# Patient Record
Sex: Female | Born: 1970
Health system: Southern US, Community
[De-identification: ages and names within clinical notes are randomized; demographics above are authoritative.]

## PROBLEM LIST (undated history)

## (undated) DIAGNOSIS — A498 Other bacterial infections of unspecified site: Secondary | ICD-10-CM

## (undated) DIAGNOSIS — K589 Irritable bowel syndrome without diarrhea: Secondary | ICD-10-CM

## (undated) DIAGNOSIS — Z9889 Other specified postprocedural states: Secondary | ICD-10-CM

## (undated) DIAGNOSIS — I2510 Atherosclerotic heart disease of native coronary artery without angina pectoris: Secondary | ICD-10-CM

## (undated) DIAGNOSIS — E039 Hypothyroidism, unspecified: Secondary | ICD-10-CM

## (undated) DIAGNOSIS — K219 Gastro-esophageal reflux disease without esophagitis: Secondary | ICD-10-CM

## (undated) DIAGNOSIS — I499 Cardiac arrhythmia, unspecified: Secondary | ICD-10-CM

## (undated) DIAGNOSIS — H9190 Unspecified hearing loss, unspecified ear: Secondary | ICD-10-CM

## (undated) DIAGNOSIS — B029 Zoster without complications: Secondary | ICD-10-CM

## (undated) DIAGNOSIS — E669 Obesity, unspecified: Secondary | ICD-10-CM

## (undated) DIAGNOSIS — K8020 Calculus of gallbladder without cholecystitis without obstruction: Secondary | ICD-10-CM

## (undated) DIAGNOSIS — K7689 Other specified diseases of liver: Secondary | ICD-10-CM

## (undated) DIAGNOSIS — I10 Essential (primary) hypertension: Secondary | ICD-10-CM

## (undated) DIAGNOSIS — Z800 Family history of malignant neoplasm of digestive organs: Secondary | ICD-10-CM

## (undated) DIAGNOSIS — R112 Nausea with vomiting, unspecified: Secondary | ICD-10-CM

## (undated) DIAGNOSIS — E785 Hyperlipidemia, unspecified: Secondary | ICD-10-CM

## (undated) DIAGNOSIS — N809 Endometriosis, unspecified: Secondary | ICD-10-CM

## (undated) DIAGNOSIS — R55 Syncope and collapse: Secondary | ICD-10-CM

## (undated) DIAGNOSIS — G43909 Migraine, unspecified, not intractable, without status migrainosus: Secondary | ICD-10-CM

## (undated) DIAGNOSIS — F419 Anxiety disorder, unspecified: Secondary | ICD-10-CM

## (undated) HISTORY — DX: Cardiac arrhythmia, unspecified: I49.9

## (undated) HISTORY — DX: Obesity, unspecified: E66.9

## (undated) HISTORY — DX: Calculus of gallbladder without cholecystitis without obstruction: K80.20

## (undated) HISTORY — PX: CORONARY ANGIOPLASTY: SHX604

## (undated) HISTORY — DX: Syncope and collapse: R55

## (undated) HISTORY — DX: Unspecified hearing loss, unspecified ear: H91.90

## (undated) HISTORY — DX: Other specified diseases of liver: K76.89

## (undated) HISTORY — PX: TUBAL LIGATION: SHX77

## (undated) HISTORY — PX: ABDOMINAL ADHESION SURGERY: SHX90

## (undated) HISTORY — DX: Family history of malignant neoplasm of digestive organs: Z80.0

## (undated) HISTORY — DX: Hypothyroidism, unspecified: E03.9

## (undated) HISTORY — DX: Endometriosis, unspecified: N80.9

## (undated) HISTORY — DX: Other bacterial infections of unspecified site: A49.8

## (undated) HISTORY — DX: Essential (primary) hypertension: I10

## (undated) HISTORY — PX: JOINT REPLACEMENT: SHX530

## (undated) HISTORY — DX: Hyperlipidemia, unspecified: E78.5

## (undated) HISTORY — DX: Anxiety disorder, unspecified: F41.9

## (undated) HISTORY — PX: SALPINGOOPHORECTOMY: SHX82

## (undated) HISTORY — PX: TONSILLECTOMY: SUR1361

## (undated) HISTORY — DX: Atherosclerotic heart disease of native coronary artery without angina pectoris: I25.10

## (undated) HISTORY — DX: Irritable bowel syndrome, unspecified: K58.9

## (undated) HISTORY — DX: Zoster without complications: B02.9

## (undated) HISTORY — DX: Gastro-esophageal reflux disease without esophagitis: K21.9

---

## 1995-09-18 HISTORY — PX: OTHER SURGICAL HISTORY: SHX169

## 1997-12-26 ENCOUNTER — Inpatient Hospital Stay (HOSPITAL_COMMUNITY): Admission: AD | Admit: 1997-12-26 | Discharge: 1997-12-26 | Payer: Self-pay | Admitting: Obstetrics and Gynecology

## 1998-01-20 ENCOUNTER — Emergency Department (HOSPITAL_COMMUNITY): Admission: EM | Admit: 1998-01-20 | Discharge: 1998-01-20 | Payer: Self-pay | Admitting: Emergency Medicine

## 1998-01-24 ENCOUNTER — Ambulatory Visit (HOSPITAL_COMMUNITY): Admission: RE | Admit: 1998-01-24 | Discharge: 1998-01-24 | Payer: Self-pay | Admitting: Orthopedic Surgery

## 1998-02-15 ENCOUNTER — Ambulatory Visit (HOSPITAL_BASED_OUTPATIENT_CLINIC_OR_DEPARTMENT_OTHER): Admission: RE | Admit: 1998-02-15 | Discharge: 1998-02-15 | Payer: Self-pay | Admitting: Otolaryngology

## 1998-02-25 ENCOUNTER — Ambulatory Visit (HOSPITAL_COMMUNITY): Admission: RE | Admit: 1998-02-25 | Discharge: 1998-02-25 | Payer: Self-pay | Admitting: Family Medicine

## 1998-03-10 ENCOUNTER — Emergency Department (HOSPITAL_COMMUNITY): Admission: EM | Admit: 1998-03-10 | Discharge: 1998-03-10 | Payer: Self-pay | Admitting: Emergency Medicine

## 1998-03-25 ENCOUNTER — Other Ambulatory Visit: Admission: RE | Admit: 1998-03-25 | Discharge: 1998-03-25 | Payer: Self-pay | Admitting: *Deleted

## 1998-04-13 ENCOUNTER — Other Ambulatory Visit: Admission: RE | Admit: 1998-04-13 | Discharge: 1998-04-13 | Payer: Self-pay | Admitting: Obstetrics and Gynecology

## 1998-04-28 ENCOUNTER — Inpatient Hospital Stay (HOSPITAL_COMMUNITY): Admission: AD | Admit: 1998-04-28 | Discharge: 1998-04-29 | Payer: Self-pay | Admitting: Obstetrics and Gynecology

## 1998-05-02 ENCOUNTER — Inpatient Hospital Stay (HOSPITAL_COMMUNITY): Admission: AD | Admit: 1998-05-02 | Discharge: 1998-05-02 | Payer: Self-pay | Admitting: Gynecology

## 1998-05-22 ENCOUNTER — Emergency Department (HOSPITAL_COMMUNITY): Admission: RE | Admit: 1998-05-22 | Discharge: 1998-05-22 | Payer: Self-pay | Admitting: Internal Medicine

## 1998-08-09 ENCOUNTER — Encounter: Payer: Self-pay | Admitting: Family Medicine

## 1998-08-09 ENCOUNTER — Ambulatory Visit (HOSPITAL_COMMUNITY): Admission: RE | Admit: 1998-08-09 | Discharge: 1998-08-09 | Payer: Self-pay | Admitting: Family Medicine

## 1998-08-12 ENCOUNTER — Encounter: Payer: Self-pay | Admitting: Emergency Medicine

## 1998-08-12 ENCOUNTER — Emergency Department (HOSPITAL_COMMUNITY): Admission: EM | Admit: 1998-08-12 | Discharge: 1998-08-12 | Payer: Self-pay | Admitting: Emergency Medicine

## 1998-08-13 ENCOUNTER — Ambulatory Visit (HOSPITAL_COMMUNITY): Admission: RE | Admit: 1998-08-13 | Discharge: 1998-08-13 | Payer: Self-pay | Admitting: Emergency Medicine

## 1998-08-17 ENCOUNTER — Ambulatory Visit (HOSPITAL_COMMUNITY): Admission: RE | Admit: 1998-08-17 | Discharge: 1998-08-17 | Payer: Self-pay | Admitting: Gastroenterology

## 1998-08-17 ENCOUNTER — Encounter: Payer: Self-pay | Admitting: Gastroenterology

## 1999-03-30 ENCOUNTER — Emergency Department (HOSPITAL_COMMUNITY): Admission: EM | Admit: 1999-03-30 | Discharge: 1999-03-30 | Payer: Self-pay

## 1999-05-01 ENCOUNTER — Ambulatory Visit (HOSPITAL_COMMUNITY): Admission: RE | Admit: 1999-05-01 | Discharge: 1999-05-01 | Payer: Self-pay | Admitting: *Deleted

## 1999-05-01 ENCOUNTER — Encounter (INDEPENDENT_AMBULATORY_CARE_PROVIDER_SITE_OTHER): Payer: Self-pay

## 1999-06-07 ENCOUNTER — Encounter: Payer: Self-pay | Admitting: Emergency Medicine

## 1999-06-07 ENCOUNTER — Emergency Department (HOSPITAL_COMMUNITY): Admission: EM | Admit: 1999-06-07 | Discharge: 1999-06-07 | Payer: Self-pay | Admitting: Emergency Medicine

## 1999-06-20 ENCOUNTER — Emergency Department (HOSPITAL_COMMUNITY): Admission: EM | Admit: 1999-06-20 | Discharge: 1999-06-20 | Payer: Self-pay | Admitting: Emergency Medicine

## 1999-07-03 ENCOUNTER — Encounter: Admission: RE | Admit: 1999-07-03 | Discharge: 1999-07-03 | Payer: Self-pay | Admitting: Internal Medicine

## 1999-07-10 ENCOUNTER — Encounter: Admission: RE | Admit: 1999-07-10 | Discharge: 1999-07-10 | Payer: Self-pay | Admitting: Internal Medicine

## 1999-08-21 ENCOUNTER — Emergency Department (HOSPITAL_COMMUNITY): Admission: EM | Admit: 1999-08-21 | Discharge: 1999-08-21 | Payer: Self-pay | Admitting: Emergency Medicine

## 1999-08-28 ENCOUNTER — Encounter: Admission: RE | Admit: 1999-08-28 | Discharge: 1999-08-28 | Payer: Self-pay | Admitting: Internal Medicine

## 1999-09-20 ENCOUNTER — Emergency Department (HOSPITAL_COMMUNITY): Admission: EM | Admit: 1999-09-20 | Discharge: 1999-09-20 | Payer: Self-pay | Admitting: Emergency Medicine

## 1999-09-20 ENCOUNTER — Encounter: Payer: Self-pay | Admitting: Emergency Medicine

## 1999-09-21 ENCOUNTER — Encounter: Admission: RE | Admit: 1999-09-21 | Discharge: 1999-09-21 | Payer: Self-pay | Admitting: Internal Medicine

## 1999-09-24 ENCOUNTER — Ambulatory Visit (HOSPITAL_COMMUNITY): Admission: RE | Admit: 1999-09-24 | Discharge: 1999-09-24 | Payer: Self-pay | Admitting: *Deleted

## 1999-09-26 ENCOUNTER — Encounter: Admission: RE | Admit: 1999-09-26 | Discharge: 1999-09-26 | Payer: Self-pay | Admitting: Hematology and Oncology

## 1999-10-05 ENCOUNTER — Encounter: Admission: RE | Admit: 1999-10-05 | Discharge: 1999-10-05 | Payer: Self-pay | Admitting: Internal Medicine

## 1999-10-20 ENCOUNTER — Ambulatory Visit (HOSPITAL_COMMUNITY): Admission: RE | Admit: 1999-10-20 | Discharge: 1999-10-20 | Payer: Self-pay | Admitting: *Deleted

## 1999-10-30 ENCOUNTER — Encounter: Admission: RE | Admit: 1999-10-30 | Discharge: 1999-10-30 | Payer: Self-pay | Admitting: Internal Medicine

## 1999-11-16 ENCOUNTER — Encounter: Admission: RE | Admit: 1999-11-16 | Discharge: 1999-11-16 | Payer: Self-pay | Admitting: Hematology and Oncology

## 2000-01-05 ENCOUNTER — Encounter: Admission: RE | Admit: 2000-01-05 | Discharge: 2000-01-05 | Payer: Self-pay | Admitting: Internal Medicine

## 2000-01-09 ENCOUNTER — Ambulatory Visit (HOSPITAL_COMMUNITY): Admission: RE | Admit: 2000-01-09 | Discharge: 2000-01-09 | Payer: Self-pay | Admitting: *Deleted

## 2000-01-09 ENCOUNTER — Encounter: Payer: Self-pay | Admitting: Internal Medicine

## 2000-01-10 ENCOUNTER — Encounter: Admission: RE | Admit: 2000-01-10 | Discharge: 2000-01-10 | Payer: Self-pay | Admitting: Hematology and Oncology

## 2000-01-17 ENCOUNTER — Encounter: Payer: Self-pay | Admitting: Obstetrics

## 2000-01-17 ENCOUNTER — Inpatient Hospital Stay (HOSPITAL_COMMUNITY): Admission: EM | Admit: 2000-01-17 | Discharge: 2000-01-17 | Payer: Self-pay | Admitting: Obstetrics

## 2000-01-17 ENCOUNTER — Encounter: Admission: RE | Admit: 2000-01-17 | Discharge: 2000-01-17 | Payer: Self-pay | Admitting: Hematology and Oncology

## 2000-01-25 ENCOUNTER — Encounter: Admission: RE | Admit: 2000-01-25 | Discharge: 2000-01-25 | Payer: Self-pay | Admitting: Obstetrics

## 2000-02-22 ENCOUNTER — Encounter: Admission: RE | Admit: 2000-02-22 | Discharge: 2000-02-22 | Payer: Self-pay | Admitting: Internal Medicine

## 2000-02-27 ENCOUNTER — Encounter: Admission: RE | Admit: 2000-02-27 | Discharge: 2000-02-27 | Payer: Self-pay | Admitting: Hematology and Oncology

## 2000-03-05 ENCOUNTER — Encounter: Admission: RE | Admit: 2000-03-05 | Discharge: 2000-03-05 | Payer: Self-pay | Admitting: Obstetrics

## 2000-03-19 ENCOUNTER — Encounter: Admission: RE | Admit: 2000-03-19 | Discharge: 2000-03-19 | Payer: Self-pay | Admitting: Obstetrics

## 2000-04-16 ENCOUNTER — Encounter (INDEPENDENT_AMBULATORY_CARE_PROVIDER_SITE_OTHER): Payer: Self-pay | Admitting: Specialist

## 2000-04-16 ENCOUNTER — Encounter (INDEPENDENT_AMBULATORY_CARE_PROVIDER_SITE_OTHER): Payer: Self-pay

## 2000-04-16 ENCOUNTER — Ambulatory Visit (HOSPITAL_COMMUNITY): Admission: RE | Admit: 2000-04-16 | Discharge: 2000-04-16 | Payer: Self-pay | Admitting: Obstetrics and Gynecology

## 2000-04-16 HISTORY — PX: LAPAROSCOPIC OVARIAN CYSTECTOMY: SUR786

## 2000-04-28 ENCOUNTER — Emergency Department (HOSPITAL_COMMUNITY): Admission: EM | Admit: 2000-04-28 | Discharge: 2000-04-28 | Payer: Self-pay | Admitting: Emergency Medicine

## 2000-05-01 ENCOUNTER — Encounter: Admission: RE | Admit: 2000-05-01 | Discharge: 2000-05-01 | Payer: Self-pay | Admitting: Hematology and Oncology

## 2000-05-16 ENCOUNTER — Encounter: Admission: RE | Admit: 2000-05-16 | Discharge: 2000-05-16 | Payer: Self-pay | Admitting: Hematology and Oncology

## 2000-06-02 ENCOUNTER — Encounter: Payer: Self-pay | Admitting: Emergency Medicine

## 2000-06-02 ENCOUNTER — Emergency Department (HOSPITAL_COMMUNITY): Admission: EM | Admit: 2000-06-02 | Discharge: 2000-06-02 | Payer: Self-pay | Admitting: Emergency Medicine

## 2000-07-04 ENCOUNTER — Emergency Department (HOSPITAL_COMMUNITY): Admission: EM | Admit: 2000-07-04 | Discharge: 2000-07-04 | Payer: Self-pay | Admitting: Emergency Medicine

## 2000-07-07 ENCOUNTER — Encounter: Payer: Self-pay | Admitting: Emergency Medicine

## 2000-07-07 ENCOUNTER — Emergency Department (HOSPITAL_COMMUNITY): Admission: EM | Admit: 2000-07-07 | Discharge: 2000-07-07 | Payer: Self-pay | Admitting: Emergency Medicine

## 2000-07-09 ENCOUNTER — Ambulatory Visit (HOSPITAL_COMMUNITY): Admission: RE | Admit: 2000-07-09 | Discharge: 2000-07-09 | Payer: Self-pay

## 2000-07-09 ENCOUNTER — Encounter: Admission: RE | Admit: 2000-07-09 | Discharge: 2000-07-09 | Payer: Self-pay

## 2000-07-10 ENCOUNTER — Encounter: Admission: RE | Admit: 2000-07-10 | Discharge: 2000-07-10 | Payer: Self-pay | Admitting: Internal Medicine

## 2000-07-12 ENCOUNTER — Encounter: Payer: Self-pay | Admitting: Hematology and Oncology

## 2000-07-12 ENCOUNTER — Encounter: Admission: RE | Admit: 2000-07-12 | Discharge: 2000-07-12 | Payer: Self-pay | Admitting: Hematology and Oncology

## 2000-07-12 ENCOUNTER — Ambulatory Visit (HOSPITAL_COMMUNITY): Admission: RE | Admit: 2000-07-12 | Discharge: 2000-07-12 | Payer: Self-pay | Admitting: Hematology and Oncology

## 2000-07-16 ENCOUNTER — Encounter: Payer: Self-pay | Admitting: Internal Medicine

## 2000-07-16 ENCOUNTER — Encounter: Admission: RE | Admit: 2000-07-16 | Discharge: 2000-07-16 | Payer: Self-pay | Admitting: Internal Medicine

## 2000-07-16 ENCOUNTER — Emergency Department (HOSPITAL_COMMUNITY): Admission: EM | Admit: 2000-07-16 | Discharge: 2000-07-16 | Payer: Self-pay | Admitting: Emergency Medicine

## 2000-07-19 ENCOUNTER — Encounter: Admission: RE | Admit: 2000-07-19 | Discharge: 2000-07-19 | Payer: Self-pay

## 2000-07-29 ENCOUNTER — Encounter: Admission: RE | Admit: 2000-07-29 | Discharge: 2000-07-29 | Payer: Self-pay | Admitting: Hematology and Oncology

## 2000-08-16 ENCOUNTER — Encounter: Payer: Self-pay | Admitting: Gastroenterology

## 2000-08-16 ENCOUNTER — Encounter: Admission: RE | Admit: 2000-08-16 | Discharge: 2000-08-16 | Payer: Self-pay | Admitting: Gastroenterology

## 2000-08-20 ENCOUNTER — Encounter: Admission: RE | Admit: 2000-08-20 | Discharge: 2000-08-20 | Payer: Self-pay | Admitting: Gastroenterology

## 2000-08-20 ENCOUNTER — Encounter: Payer: Self-pay | Admitting: Gastroenterology

## 2000-09-02 ENCOUNTER — Encounter: Admission: RE | Admit: 2000-09-02 | Discharge: 2000-09-02 | Payer: Self-pay | Admitting: Internal Medicine

## 2000-10-14 ENCOUNTER — Encounter: Payer: Self-pay | Admitting: Internal Medicine

## 2000-10-14 ENCOUNTER — Ambulatory Visit (HOSPITAL_COMMUNITY): Admission: RE | Admit: 2000-10-14 | Discharge: 2000-10-14 | Payer: Self-pay | Admitting: *Deleted

## 2000-10-16 ENCOUNTER — Emergency Department (HOSPITAL_COMMUNITY): Admission: EM | Admit: 2000-10-16 | Discharge: 2000-10-16 | Payer: Self-pay | Admitting: Emergency Medicine

## 2000-10-17 ENCOUNTER — Encounter: Admission: RE | Admit: 2000-10-17 | Discharge: 2000-10-17 | Payer: Self-pay | Admitting: Hematology and Oncology

## 2000-10-21 ENCOUNTER — Encounter: Admission: RE | Admit: 2000-10-21 | Discharge: 2000-10-21 | Payer: Self-pay | Admitting: Internal Medicine

## 2000-11-05 ENCOUNTER — Encounter: Admission: RE | Admit: 2000-11-05 | Discharge: 2000-11-05 | Payer: Self-pay | Admitting: Internal Medicine

## 2000-11-30 ENCOUNTER — Emergency Department (HOSPITAL_COMMUNITY): Admission: EM | Admit: 2000-11-30 | Discharge: 2000-11-30 | Payer: Self-pay | Admitting: Emergency Medicine

## 2000-12-23 ENCOUNTER — Encounter: Admission: RE | Admit: 2000-12-23 | Discharge: 2000-12-23 | Payer: Self-pay | Admitting: Internal Medicine

## 2001-01-22 ENCOUNTER — Encounter: Admission: RE | Admit: 2001-01-22 | Discharge: 2001-01-22 | Payer: Self-pay | Admitting: Internal Medicine

## 2001-03-10 ENCOUNTER — Emergency Department (HOSPITAL_COMMUNITY): Admission: EM | Admit: 2001-03-10 | Discharge: 2001-03-10 | Payer: Self-pay | Admitting: Emergency Medicine

## 2001-03-10 ENCOUNTER — Encounter: Payer: Self-pay | Admitting: Emergency Medicine

## 2001-06-05 ENCOUNTER — Encounter: Admission: RE | Admit: 2001-06-05 | Discharge: 2001-06-05 | Payer: Self-pay | Admitting: Internal Medicine

## 2001-06-18 ENCOUNTER — Encounter: Admission: RE | Admit: 2001-06-18 | Discharge: 2001-06-18 | Payer: Self-pay | Admitting: Internal Medicine

## 2001-06-26 ENCOUNTER — Encounter: Admission: RE | Admit: 2001-06-26 | Discharge: 2001-06-26 | Payer: Self-pay

## 2001-08-16 ENCOUNTER — Encounter: Payer: Self-pay | Admitting: Internal Medicine

## 2001-08-16 ENCOUNTER — Inpatient Hospital Stay (HOSPITAL_COMMUNITY): Admission: EM | Admit: 2001-08-16 | Discharge: 2001-08-17 | Payer: Self-pay | Admitting: *Deleted

## 2001-10-13 ENCOUNTER — Encounter: Admission: RE | Admit: 2001-10-13 | Discharge: 2001-10-13 | Payer: Self-pay

## 2001-10-27 ENCOUNTER — Encounter: Admission: RE | Admit: 2001-10-27 | Discharge: 2001-10-27 | Payer: Self-pay | Admitting: Internal Medicine

## 2001-10-28 ENCOUNTER — Emergency Department (HOSPITAL_COMMUNITY): Admission: EM | Admit: 2001-10-28 | Discharge: 2001-10-28 | Payer: Self-pay | Admitting: Emergency Medicine

## 2001-11-12 ENCOUNTER — Encounter: Admission: RE | Admit: 2001-11-12 | Discharge: 2001-11-12 | Payer: Self-pay | Admitting: Internal Medicine

## 2002-01-09 ENCOUNTER — Encounter: Payer: Self-pay | Admitting: Internal Medicine

## 2002-01-09 ENCOUNTER — Encounter: Admission: RE | Admit: 2002-01-09 | Discharge: 2002-01-09 | Payer: Self-pay | Admitting: Internal Medicine

## 2002-02-05 ENCOUNTER — Emergency Department (HOSPITAL_COMMUNITY): Admission: EM | Admit: 2002-02-05 | Discharge: 2002-02-05 | Payer: Self-pay | Admitting: Emergency Medicine

## 2002-02-21 ENCOUNTER — Emergency Department (HOSPITAL_COMMUNITY): Admission: EM | Admit: 2002-02-21 | Discharge: 2002-02-21 | Payer: Self-pay | Admitting: Emergency Medicine

## 2002-02-21 ENCOUNTER — Encounter: Payer: Self-pay | Admitting: Emergency Medicine

## 2002-05-04 ENCOUNTER — Emergency Department (HOSPITAL_COMMUNITY): Admission: EM | Admit: 2002-05-04 | Discharge: 2002-05-04 | Payer: Self-pay | Admitting: Emergency Medicine

## 2002-05-04 ENCOUNTER — Encounter: Payer: Self-pay | Admitting: Emergency Medicine

## 2002-05-29 ENCOUNTER — Encounter: Admission: RE | Admit: 2002-05-29 | Discharge: 2002-05-29 | Payer: Self-pay | Admitting: Internal Medicine

## 2002-06-09 ENCOUNTER — Emergency Department (HOSPITAL_COMMUNITY): Admission: EM | Admit: 2002-06-09 | Discharge: 2002-06-10 | Payer: Self-pay | Admitting: *Deleted

## 2002-06-24 ENCOUNTER — Emergency Department (HOSPITAL_COMMUNITY): Admission: EM | Admit: 2002-06-24 | Discharge: 2002-06-24 | Payer: Self-pay | Admitting: Emergency Medicine

## 2002-06-28 ENCOUNTER — Encounter: Payer: Self-pay | Admitting: Emergency Medicine

## 2002-06-28 ENCOUNTER — Emergency Department (HOSPITAL_COMMUNITY): Admission: EM | Admit: 2002-06-28 | Discharge: 2002-06-28 | Payer: Self-pay | Admitting: Emergency Medicine

## 2002-07-05 ENCOUNTER — Encounter: Payer: Self-pay | Admitting: Emergency Medicine

## 2002-07-05 ENCOUNTER — Emergency Department (HOSPITAL_COMMUNITY): Admission: EM | Admit: 2002-07-05 | Discharge: 2002-07-05 | Payer: Self-pay | Admitting: Emergency Medicine

## 2002-07-06 ENCOUNTER — Encounter: Payer: Self-pay | Admitting: Emergency Medicine

## 2002-07-06 ENCOUNTER — Emergency Department (HOSPITAL_COMMUNITY): Admission: EM | Admit: 2002-07-06 | Discharge: 2002-07-06 | Payer: Self-pay | Admitting: Emergency Medicine

## 2002-07-08 ENCOUNTER — Encounter: Admission: RE | Admit: 2002-07-08 | Discharge: 2002-07-08 | Payer: Self-pay | Admitting: Internal Medicine

## 2002-08-21 ENCOUNTER — Encounter: Admission: RE | Admit: 2002-08-21 | Discharge: 2002-08-21 | Payer: Self-pay | Admitting: Internal Medicine

## 2002-10-17 ENCOUNTER — Emergency Department (HOSPITAL_COMMUNITY): Admission: EM | Admit: 2002-10-17 | Discharge: 2002-10-17 | Payer: Self-pay | Admitting: Emergency Medicine

## 2002-12-07 ENCOUNTER — Encounter: Payer: Self-pay | Admitting: Obstetrics and Gynecology

## 2002-12-07 ENCOUNTER — Inpatient Hospital Stay (HOSPITAL_COMMUNITY): Admission: AD | Admit: 2002-12-07 | Discharge: 2002-12-07 | Payer: Self-pay | Admitting: Obstetrics and Gynecology

## 2002-12-08 ENCOUNTER — Encounter: Admission: RE | Admit: 2002-12-08 | Discharge: 2002-12-08 | Payer: Self-pay | Admitting: Internal Medicine

## 2002-12-09 ENCOUNTER — Ambulatory Visit (HOSPITAL_COMMUNITY): Admission: RE | Admit: 2002-12-09 | Discharge: 2002-12-09 | Payer: Self-pay | Admitting: Internal Medicine

## 2002-12-10 ENCOUNTER — Ambulatory Visit (HOSPITAL_COMMUNITY): Admission: RE | Admit: 2002-12-10 | Discharge: 2002-12-10 | Payer: Self-pay

## 2002-12-11 ENCOUNTER — Encounter: Admission: RE | Admit: 2002-12-11 | Discharge: 2002-12-11 | Payer: Self-pay | Admitting: *Deleted

## 2002-12-14 ENCOUNTER — Encounter: Admission: RE | Admit: 2002-12-14 | Discharge: 2002-12-14 | Payer: Self-pay | Admitting: Internal Medicine

## 2002-12-17 ENCOUNTER — Ambulatory Visit (HOSPITAL_COMMUNITY): Admission: RE | Admit: 2002-12-17 | Discharge: 2002-12-17 | Payer: Self-pay

## 2002-12-23 ENCOUNTER — Encounter: Admission: RE | Admit: 2002-12-23 | Discharge: 2002-12-23 | Payer: Self-pay | Admitting: Internal Medicine

## 2002-12-25 ENCOUNTER — Emergency Department (HOSPITAL_COMMUNITY): Admission: EM | Admit: 2002-12-25 | Discharge: 2002-12-25 | Payer: Self-pay | Admitting: Emergency Medicine

## 2003-01-05 ENCOUNTER — Encounter: Payer: Self-pay | Admitting: Emergency Medicine

## 2003-01-05 ENCOUNTER — Emergency Department (HOSPITAL_COMMUNITY): Admission: EM | Admit: 2003-01-05 | Discharge: 2003-01-05 | Payer: Self-pay | Admitting: Emergency Medicine

## 2003-01-29 ENCOUNTER — Encounter: Admission: RE | Admit: 2003-01-29 | Discharge: 2003-01-29 | Payer: Self-pay | Admitting: Internal Medicine

## 2003-02-09 ENCOUNTER — Other Ambulatory Visit: Admission: RE | Admit: 2003-02-09 | Discharge: 2003-02-09 | Payer: Self-pay | Admitting: Obstetrics and Gynecology

## 2003-03-08 ENCOUNTER — Emergency Department (HOSPITAL_COMMUNITY): Admission: EM | Admit: 2003-03-08 | Discharge: 2003-03-08 | Payer: Self-pay | Admitting: Emergency Medicine

## 2003-03-11 ENCOUNTER — Encounter: Admission: RE | Admit: 2003-03-11 | Discharge: 2003-03-11 | Payer: Self-pay | Admitting: Internal Medicine

## 2003-03-18 ENCOUNTER — Encounter (INDEPENDENT_AMBULATORY_CARE_PROVIDER_SITE_OTHER): Payer: Self-pay | Admitting: Pulmonary Disease

## 2003-03-18 ENCOUNTER — Encounter: Admission: RE | Admit: 2003-03-18 | Discharge: 2003-03-18 | Payer: Self-pay | Admitting: Obstetrics and Gynecology

## 2003-03-21 ENCOUNTER — Emergency Department (HOSPITAL_COMMUNITY): Admission: EM | Admit: 2003-03-21 | Discharge: 2003-03-21 | Payer: Self-pay | Admitting: Emergency Medicine

## 2003-03-21 ENCOUNTER — Encounter: Payer: Self-pay | Admitting: Emergency Medicine

## 2003-04-22 ENCOUNTER — Encounter: Admission: RE | Admit: 2003-04-22 | Discharge: 2003-04-22 | Payer: Self-pay | Admitting: Internal Medicine

## 2003-05-25 ENCOUNTER — Encounter: Admission: RE | Admit: 2003-05-25 | Discharge: 2003-05-25 | Payer: Self-pay | Admitting: Internal Medicine

## 2003-05-31 ENCOUNTER — Encounter: Admission: RE | Admit: 2003-05-31 | Discharge: 2003-05-31 | Payer: Self-pay | Admitting: Infectious Diseases

## 2003-06-01 ENCOUNTER — Encounter: Admission: RE | Admit: 2003-06-01 | Discharge: 2003-06-01 | Payer: Self-pay | Admitting: Internal Medicine

## 2003-06-07 ENCOUNTER — Encounter: Admission: RE | Admit: 2003-06-07 | Discharge: 2003-06-07 | Payer: Self-pay | Admitting: Internal Medicine

## 2003-06-14 ENCOUNTER — Encounter: Admission: RE | Admit: 2003-06-14 | Discharge: 2003-06-14 | Payer: Self-pay | Admitting: Internal Medicine

## 2003-06-15 ENCOUNTER — Encounter: Admission: RE | Admit: 2003-06-15 | Discharge: 2003-06-15 | Payer: Self-pay | Admitting: Internal Medicine

## 2003-06-15 ENCOUNTER — Ambulatory Visit (HOSPITAL_COMMUNITY): Admission: RE | Admit: 2003-06-15 | Discharge: 2003-06-15 | Payer: Self-pay | Admitting: Internal Medicine

## 2003-06-15 ENCOUNTER — Encounter: Payer: Self-pay | Admitting: Internal Medicine

## 2003-06-16 ENCOUNTER — Encounter: Admission: RE | Admit: 2003-06-16 | Discharge: 2003-06-16 | Payer: Self-pay | Admitting: Internal Medicine

## 2003-06-22 ENCOUNTER — Emergency Department (HOSPITAL_COMMUNITY): Admission: EM | Admit: 2003-06-22 | Discharge: 2003-06-22 | Payer: Self-pay | Admitting: Emergency Medicine

## 2003-07-14 ENCOUNTER — Emergency Department (HOSPITAL_COMMUNITY): Admission: EM | Admit: 2003-07-14 | Discharge: 2003-07-15 | Payer: Self-pay | Admitting: Emergency Medicine

## 2003-07-19 ENCOUNTER — Encounter: Admission: RE | Admit: 2003-07-19 | Discharge: 2003-07-19 | Payer: Self-pay | Admitting: Internal Medicine

## 2003-08-19 ENCOUNTER — Encounter: Admission: RE | Admit: 2003-08-19 | Discharge: 2003-08-19 | Payer: Self-pay | Admitting: Internal Medicine

## 2003-09-21 ENCOUNTER — Encounter: Admission: RE | Admit: 2003-09-21 | Discharge: 2003-09-21 | Payer: Self-pay | Admitting: Obstetrics and Gynecology

## 2003-10-22 ENCOUNTER — Emergency Department (HOSPITAL_COMMUNITY): Admission: EM | Admit: 2003-10-22 | Discharge: 2003-10-22 | Payer: Self-pay | Admitting: Emergency Medicine

## 2003-11-05 ENCOUNTER — Encounter: Admission: RE | Admit: 2003-11-05 | Discharge: 2003-11-05 | Payer: Self-pay | Admitting: Internal Medicine

## 2003-12-01 ENCOUNTER — Encounter: Admission: RE | Admit: 2003-12-01 | Discharge: 2003-12-01 | Payer: Self-pay | Admitting: Internal Medicine

## 2003-12-07 ENCOUNTER — Inpatient Hospital Stay (HOSPITAL_COMMUNITY): Admission: AD | Admit: 2003-12-07 | Discharge: 2003-12-07 | Payer: Self-pay | Admitting: *Deleted

## 2003-12-09 ENCOUNTER — Encounter: Admission: RE | Admit: 2003-12-09 | Discharge: 2003-12-09 | Payer: Self-pay | Admitting: Obstetrics and Gynecology

## 2003-12-23 ENCOUNTER — Emergency Department (HOSPITAL_COMMUNITY): Admission: EM | Admit: 2003-12-23 | Discharge: 2003-12-24 | Payer: Self-pay | Admitting: Emergency Medicine

## 2003-12-30 ENCOUNTER — Inpatient Hospital Stay (HOSPITAL_COMMUNITY): Admission: AD | Admit: 2003-12-30 | Discharge: 2003-12-30 | Payer: Self-pay | Admitting: Obstetrics & Gynecology

## 2004-01-13 ENCOUNTER — Inpatient Hospital Stay (HOSPITAL_COMMUNITY): Admission: RE | Admit: 2004-01-13 | Discharge: 2004-01-16 | Payer: Self-pay | Admitting: Obstetrics and Gynecology

## 2004-01-13 ENCOUNTER — Encounter (INDEPENDENT_AMBULATORY_CARE_PROVIDER_SITE_OTHER): Payer: Self-pay | Admitting: Specialist

## 2004-01-13 HISTORY — PX: TOTAL ABDOMINAL HYSTERECTOMY: SHX209

## 2004-01-20 ENCOUNTER — Encounter: Admission: RE | Admit: 2004-01-20 | Discharge: 2004-01-20 | Payer: Self-pay | Admitting: Obstetrics and Gynecology

## 2004-01-25 ENCOUNTER — Encounter: Admission: RE | Admit: 2004-01-25 | Discharge: 2004-01-25 | Payer: Self-pay | Admitting: Obstetrics and Gynecology

## 2004-02-03 ENCOUNTER — Encounter: Admission: RE | Admit: 2004-02-03 | Discharge: 2004-02-03 | Payer: Self-pay | Admitting: Obstetrics and Gynecology

## 2004-02-04 ENCOUNTER — Inpatient Hospital Stay (HOSPITAL_COMMUNITY): Admission: AD | Admit: 2004-02-04 | Discharge: 2004-02-07 | Payer: Self-pay | Admitting: Obstetrics & Gynecology

## 2004-02-22 ENCOUNTER — Encounter: Admission: RE | Admit: 2004-02-22 | Discharge: 2004-02-22 | Payer: Self-pay | Admitting: Obstetrics and Gynecology

## 2004-02-23 ENCOUNTER — Encounter: Admission: RE | Admit: 2004-02-23 | Discharge: 2004-02-23 | Payer: Self-pay | Admitting: Internal Medicine

## 2004-02-25 ENCOUNTER — Encounter: Admission: RE | Admit: 2004-02-25 | Discharge: 2004-02-25 | Payer: Self-pay | Admitting: Internal Medicine

## 2004-02-28 ENCOUNTER — Encounter: Admission: RE | Admit: 2004-02-28 | Discharge: 2004-02-28 | Payer: Self-pay | Admitting: Internal Medicine

## 2004-03-10 ENCOUNTER — Emergency Department (HOSPITAL_COMMUNITY): Admission: EM | Admit: 2004-03-10 | Discharge: 2004-03-10 | Payer: Self-pay | Admitting: Emergency Medicine

## 2004-03-13 ENCOUNTER — Encounter: Admission: RE | Admit: 2004-03-13 | Discharge: 2004-03-13 | Payer: Self-pay | Admitting: Internal Medicine

## 2004-03-16 ENCOUNTER — Encounter: Admission: RE | Admit: 2004-03-16 | Discharge: 2004-03-16 | Payer: Self-pay | Admitting: Internal Medicine

## 2004-03-21 ENCOUNTER — Encounter: Admission: RE | Admit: 2004-03-21 | Discharge: 2004-03-21 | Payer: Self-pay | Admitting: Internal Medicine

## 2004-03-24 ENCOUNTER — Encounter: Admission: RE | Admit: 2004-03-24 | Discharge: 2004-03-24 | Payer: Self-pay | Admitting: Internal Medicine

## 2004-03-28 ENCOUNTER — Encounter: Admission: RE | Admit: 2004-03-28 | Discharge: 2004-03-28 | Payer: Self-pay | Admitting: Obstetrics and Gynecology

## 2004-03-31 ENCOUNTER — Encounter: Admission: RE | Admit: 2004-03-31 | Discharge: 2004-03-31 | Payer: Self-pay | Admitting: Internal Medicine

## 2004-04-27 ENCOUNTER — Emergency Department (HOSPITAL_COMMUNITY): Admission: EM | Admit: 2004-04-27 | Discharge: 2004-04-27 | Payer: Self-pay | Admitting: Emergency Medicine

## 2004-05-16 ENCOUNTER — Encounter: Admission: RE | Admit: 2004-05-16 | Discharge: 2004-05-16 | Payer: Self-pay | Admitting: Internal Medicine

## 2004-05-21 ENCOUNTER — Inpatient Hospital Stay (HOSPITAL_COMMUNITY): Admission: AD | Admit: 2004-05-21 | Discharge: 2004-05-21 | Payer: Self-pay | Admitting: *Deleted

## 2004-05-30 ENCOUNTER — Ambulatory Visit: Payer: Self-pay | Admitting: Internal Medicine

## 2004-06-07 ENCOUNTER — Emergency Department (HOSPITAL_COMMUNITY): Admission: EM | Admit: 2004-06-07 | Discharge: 2004-06-07 | Payer: Self-pay | Admitting: Emergency Medicine

## 2004-06-08 ENCOUNTER — Ambulatory Visit: Payer: Self-pay | Admitting: Obstetrics and Gynecology

## 2004-06-13 ENCOUNTER — Ambulatory Visit (HOSPITAL_COMMUNITY): Admission: RE | Admit: 2004-06-13 | Discharge: 2004-06-13 | Payer: Self-pay | Admitting: Internal Medicine

## 2004-06-15 ENCOUNTER — Ambulatory Visit: Payer: Self-pay | Admitting: Internal Medicine

## 2004-06-20 ENCOUNTER — Ambulatory Visit: Payer: Self-pay | Admitting: Obstetrics and Gynecology

## 2004-07-20 ENCOUNTER — Emergency Department (HOSPITAL_COMMUNITY): Admission: EM | Admit: 2004-07-20 | Discharge: 2004-07-20 | Payer: Self-pay | Admitting: Emergency Medicine

## 2004-07-20 ENCOUNTER — Ambulatory Visit (HOSPITAL_COMMUNITY): Admission: RE | Admit: 2004-07-20 | Discharge: 2004-07-20 | Payer: Self-pay | Admitting: Obstetrics and Gynecology

## 2004-07-20 ENCOUNTER — Ambulatory Visit: Payer: Self-pay | Admitting: Obstetrics and Gynecology

## 2004-07-26 ENCOUNTER — Encounter (INDEPENDENT_AMBULATORY_CARE_PROVIDER_SITE_OTHER): Payer: Self-pay | Admitting: Pulmonary Disease

## 2004-07-26 ENCOUNTER — Ambulatory Visit: Payer: Self-pay | Admitting: Internal Medicine

## 2004-08-02 ENCOUNTER — Ambulatory Visit: Payer: Self-pay | Admitting: Internal Medicine

## 2004-09-04 ENCOUNTER — Emergency Department (HOSPITAL_COMMUNITY): Admission: EM | Admit: 2004-09-04 | Discharge: 2004-09-04 | Payer: Self-pay | Admitting: Emergency Medicine

## 2004-09-05 ENCOUNTER — Ambulatory Visit: Payer: Self-pay | Admitting: Internal Medicine

## 2004-09-12 ENCOUNTER — Emergency Department (HOSPITAL_COMMUNITY): Admission: EM | Admit: 2004-09-12 | Discharge: 2004-09-12 | Payer: Self-pay | Admitting: Family Medicine

## 2004-09-19 ENCOUNTER — Ambulatory Visit: Payer: Self-pay | Admitting: Internal Medicine

## 2004-09-23 ENCOUNTER — Emergency Department (HOSPITAL_COMMUNITY): Admission: EM | Admit: 2004-09-23 | Discharge: 2004-09-23 | Payer: Self-pay | Admitting: Family Medicine

## 2004-09-25 ENCOUNTER — Ambulatory Visit: Payer: Self-pay | Admitting: Internal Medicine

## 2004-09-25 ENCOUNTER — Inpatient Hospital Stay (HOSPITAL_COMMUNITY): Admission: AD | Admit: 2004-09-25 | Discharge: 2004-09-27 | Payer: Self-pay | Admitting: Internal Medicine

## 2004-10-04 ENCOUNTER — Ambulatory Visit: Payer: Self-pay | Admitting: Internal Medicine

## 2004-10-18 ENCOUNTER — Ambulatory Visit: Payer: Self-pay | Admitting: Internal Medicine

## 2004-11-22 ENCOUNTER — Ambulatory Visit: Payer: Self-pay | Admitting: Internal Medicine

## 2004-12-05 ENCOUNTER — Emergency Department (HOSPITAL_COMMUNITY): Admission: EM | Admit: 2004-12-05 | Discharge: 2004-12-06 | Payer: Self-pay | Admitting: Emergency Medicine

## 2004-12-08 ENCOUNTER — Inpatient Hospital Stay (HOSPITAL_COMMUNITY): Admission: AD | Admit: 2004-12-08 | Discharge: 2004-12-11 | Payer: Self-pay | Admitting: Internal Medicine

## 2004-12-08 ENCOUNTER — Ambulatory Visit: Payer: Self-pay | Admitting: Internal Medicine

## 2004-12-13 ENCOUNTER — Ambulatory Visit: Payer: Self-pay | Admitting: Infectious Diseases

## 2004-12-13 ENCOUNTER — Ambulatory Visit: Payer: Self-pay | Admitting: Internal Medicine

## 2004-12-13 ENCOUNTER — Inpatient Hospital Stay (HOSPITAL_COMMUNITY): Admission: AD | Admit: 2004-12-13 | Discharge: 2004-12-14 | Payer: Self-pay | Admitting: Internal Medicine

## 2004-12-17 ENCOUNTER — Ambulatory Visit (HOSPITAL_COMMUNITY): Admission: RE | Admit: 2004-12-17 | Discharge: 2004-12-17 | Payer: Self-pay | Admitting: Interventional Radiology

## 2004-12-21 ENCOUNTER — Ambulatory Visit: Payer: Self-pay | Admitting: Internal Medicine

## 2004-12-25 ENCOUNTER — Ambulatory Visit (HOSPITAL_COMMUNITY): Admission: RE | Admit: 2004-12-25 | Discharge: 2004-12-25 | Payer: Self-pay | Admitting: Internal Medicine

## 2005-01-01 ENCOUNTER — Ambulatory Visit: Payer: Self-pay | Admitting: Internal Medicine

## 2005-01-23 ENCOUNTER — Ambulatory Visit: Payer: Self-pay | Admitting: Gastroenterology

## 2005-01-25 ENCOUNTER — Ambulatory Visit: Payer: Self-pay | Admitting: Obstetrics and Gynecology

## 2005-01-30 ENCOUNTER — Ambulatory Visit: Payer: Self-pay | Admitting: Obstetrics and Gynecology

## 2005-02-14 ENCOUNTER — Emergency Department (HOSPITAL_COMMUNITY): Admission: EM | Admit: 2005-02-14 | Discharge: 2005-02-14 | Payer: Self-pay | Admitting: Family Medicine

## 2005-02-26 ENCOUNTER — Ambulatory Visit: Payer: Self-pay | Admitting: Internal Medicine

## 2005-03-16 ENCOUNTER — Inpatient Hospital Stay (HOSPITAL_COMMUNITY): Admission: AD | Admit: 2005-03-16 | Discharge: 2005-03-16 | Payer: Self-pay | Admitting: Family Medicine

## 2005-03-31 ENCOUNTER — Emergency Department (HOSPITAL_COMMUNITY): Admission: EM | Admit: 2005-03-31 | Discharge: 2005-03-31 | Payer: Self-pay | Admitting: Emergency Medicine

## 2005-04-11 ENCOUNTER — Emergency Department (HOSPITAL_COMMUNITY): Admission: EM | Admit: 2005-04-11 | Discharge: 2005-04-11 | Payer: Self-pay | Admitting: Family Medicine

## 2005-04-12 ENCOUNTER — Ambulatory Visit: Payer: Self-pay | Admitting: Internal Medicine

## 2005-04-12 ENCOUNTER — Ambulatory Visit (HOSPITAL_COMMUNITY): Admission: RE | Admit: 2005-04-12 | Discharge: 2005-04-12 | Payer: Self-pay | Admitting: Internal Medicine

## 2005-04-13 ENCOUNTER — Ambulatory Visit: Payer: Self-pay | Admitting: Internal Medicine

## 2005-04-22 ENCOUNTER — Emergency Department (HOSPITAL_COMMUNITY): Admission: EM | Admit: 2005-04-22 | Discharge: 2005-04-22 | Payer: Self-pay | Admitting: Emergency Medicine

## 2005-06-04 ENCOUNTER — Emergency Department (HOSPITAL_COMMUNITY): Admission: EM | Admit: 2005-06-04 | Discharge: 2005-06-04 | Payer: Self-pay | Admitting: *Deleted

## 2005-06-04 ENCOUNTER — Emergency Department (HOSPITAL_COMMUNITY): Admission: EM | Admit: 2005-06-04 | Discharge: 2005-06-04 | Payer: Self-pay | Admitting: Family Medicine

## 2005-06-05 ENCOUNTER — Emergency Department (HOSPITAL_COMMUNITY): Admission: EM | Admit: 2005-06-05 | Discharge: 2005-06-05 | Payer: Self-pay | Admitting: Emergency Medicine

## 2005-06-07 ENCOUNTER — Emergency Department (HOSPITAL_COMMUNITY): Admission: EM | Admit: 2005-06-07 | Discharge: 2005-06-07 | Payer: Self-pay | Admitting: Emergency Medicine

## 2005-07-02 ENCOUNTER — Emergency Department (HOSPITAL_COMMUNITY): Admission: EM | Admit: 2005-07-02 | Discharge: 2005-07-02 | Payer: Self-pay | Admitting: Emergency Medicine

## 2005-07-08 ENCOUNTER — Emergency Department (HOSPITAL_COMMUNITY): Admission: EM | Admit: 2005-07-08 | Discharge: 2005-07-08 | Payer: Self-pay | Admitting: *Deleted

## 2005-07-11 ENCOUNTER — Ambulatory Visit: Payer: Self-pay | Admitting: Internal Medicine

## 2005-07-19 ENCOUNTER — Ambulatory Visit: Payer: Self-pay | Admitting: Internal Medicine

## 2005-10-14 ENCOUNTER — Emergency Department (HOSPITAL_COMMUNITY): Admission: EM | Admit: 2005-10-14 | Discharge: 2005-10-14 | Payer: Self-pay | Admitting: Emergency Medicine

## 2005-10-15 ENCOUNTER — Emergency Department (HOSPITAL_COMMUNITY): Admission: EM | Admit: 2005-10-15 | Discharge: 2005-10-15 | Payer: Self-pay | Admitting: Family Medicine

## 2005-11-12 ENCOUNTER — Emergency Department (HOSPITAL_COMMUNITY): Admission: EM | Admit: 2005-11-12 | Discharge: 2005-11-12 | Payer: Self-pay | Admitting: Family Medicine

## 2005-11-14 ENCOUNTER — Emergency Department (HOSPITAL_COMMUNITY): Admission: EM | Admit: 2005-11-14 | Discharge: 2005-11-14 | Payer: Self-pay | Admitting: Emergency Medicine

## 2005-12-11 ENCOUNTER — Inpatient Hospital Stay (HOSPITAL_COMMUNITY): Admission: AD | Admit: 2005-12-11 | Discharge: 2005-12-11 | Payer: Self-pay | Admitting: Obstetrics and Gynecology

## 2005-12-25 ENCOUNTER — Ambulatory Visit: Payer: Self-pay | Admitting: Hospitalist

## 2005-12-26 ENCOUNTER — Ambulatory Visit: Payer: Self-pay | Admitting: Obstetrics and Gynecology

## 2006-01-08 ENCOUNTER — Ambulatory Visit: Payer: Self-pay | Admitting: Gastroenterology

## 2006-01-14 ENCOUNTER — Ambulatory Visit (HOSPITAL_COMMUNITY): Admission: RE | Admit: 2006-01-14 | Discharge: 2006-01-14 | Payer: Self-pay | Admitting: Obstetrics and Gynecology

## 2006-01-14 ENCOUNTER — Ambulatory Visit: Payer: Self-pay | Admitting: Obstetrics and Gynecology

## 2006-01-14 HISTORY — PX: DIAGNOSTIC LAPAROSCOPY: SUR761

## 2006-01-16 ENCOUNTER — Ambulatory Visit: Payer: Self-pay | Admitting: *Deleted

## 2006-01-16 ENCOUNTER — Inpatient Hospital Stay (HOSPITAL_COMMUNITY): Admission: AD | Admit: 2006-01-16 | Discharge: 2006-01-16 | Payer: Self-pay | Admitting: Gynecology

## 2006-01-23 ENCOUNTER — Ambulatory Visit: Payer: Self-pay | Admitting: Obstetrics and Gynecology

## 2006-01-23 ENCOUNTER — Ambulatory Visit: Payer: Self-pay | Admitting: Internal Medicine

## 2006-01-30 ENCOUNTER — Ambulatory Visit: Payer: Self-pay | Admitting: Internal Medicine

## 2006-01-30 ENCOUNTER — Ambulatory Visit (HOSPITAL_COMMUNITY): Admission: RE | Admit: 2006-01-30 | Discharge: 2006-01-30 | Payer: Self-pay | Admitting: Internal Medicine

## 2006-02-01 ENCOUNTER — Ambulatory Visit: Payer: Self-pay | Admitting: Internal Medicine

## 2006-02-04 ENCOUNTER — Ambulatory Visit: Payer: Self-pay | Admitting: Gastroenterology

## 2006-02-06 ENCOUNTER — Ambulatory Visit: Payer: Self-pay | Admitting: Obstetrics and Gynecology

## 2006-03-04 ENCOUNTER — Ambulatory Visit: Payer: Self-pay | Admitting: Internal Medicine

## 2006-03-07 ENCOUNTER — Ambulatory Visit: Payer: Self-pay | Admitting: Gastroenterology

## 2006-03-16 ENCOUNTER — Inpatient Hospital Stay (HOSPITAL_COMMUNITY): Admission: AD | Admit: 2006-03-16 | Discharge: 2006-03-17 | Payer: Self-pay | Admitting: Family Medicine

## 2006-04-11 ENCOUNTER — Emergency Department (HOSPITAL_COMMUNITY): Admission: EM | Admit: 2006-04-11 | Discharge: 2006-04-11 | Payer: Self-pay | Admitting: Emergency Medicine

## 2006-04-18 ENCOUNTER — Emergency Department: Payer: Self-pay | Admitting: Internal Medicine

## 2006-04-23 ENCOUNTER — Emergency Department (HOSPITAL_COMMUNITY): Admission: EM | Admit: 2006-04-23 | Discharge: 2006-04-23 | Payer: Self-pay | Admitting: Family Medicine

## 2006-05-08 ENCOUNTER — Ambulatory Visit: Payer: Self-pay | Admitting: Obstetrics and Gynecology

## 2006-05-14 ENCOUNTER — Ambulatory Visit (HOSPITAL_BASED_OUTPATIENT_CLINIC_OR_DEPARTMENT_OTHER): Admission: RE | Admit: 2006-05-14 | Discharge: 2006-05-15 | Payer: Self-pay | Admitting: Otolaryngology

## 2006-05-14 HISTORY — PX: STAPEDECTOMY: SHX2435

## 2006-06-05 ENCOUNTER — Inpatient Hospital Stay (HOSPITAL_COMMUNITY): Admission: AD | Admit: 2006-06-05 | Discharge: 2006-06-05 | Payer: Self-pay | Admitting: Gynecology

## 2006-06-22 ENCOUNTER — Emergency Department (HOSPITAL_COMMUNITY): Admission: EM | Admit: 2006-06-22 | Discharge: 2006-06-23 | Payer: Self-pay | Admitting: Emergency Medicine

## 2006-06-25 ENCOUNTER — Ambulatory Visit: Payer: Self-pay | Admitting: Internal Medicine

## 2006-07-01 DIAGNOSIS — K219 Gastro-esophageal reflux disease without esophagitis: Secondary | ICD-10-CM | POA: Insufficient documentation

## 2006-07-01 DIAGNOSIS — K589 Irritable bowel syndrome without diarrhea: Secondary | ICD-10-CM

## 2006-07-01 DIAGNOSIS — E039 Hypothyroidism, unspecified: Secondary | ICD-10-CM | POA: Insufficient documentation

## 2006-07-01 HISTORY — DX: Gastro-esophageal reflux disease without esophagitis: K21.9

## 2006-07-01 HISTORY — DX: Irritable bowel syndrome, unspecified: K58.9

## 2006-07-10 ENCOUNTER — Ambulatory Visit: Payer: Self-pay | Admitting: Obstetrics and Gynecology

## 2006-08-06 ENCOUNTER — Ambulatory Visit: Payer: Self-pay | Admitting: Internal Medicine

## 2006-08-23 ENCOUNTER — Emergency Department (HOSPITAL_COMMUNITY): Admission: EM | Admit: 2006-08-23 | Discharge: 2006-08-23 | Payer: Self-pay | Admitting: Emergency Medicine

## 2006-09-03 DIAGNOSIS — F411 Generalized anxiety disorder: Secondary | ICD-10-CM | POA: Insufficient documentation

## 2006-09-07 ENCOUNTER — Emergency Department: Payer: Self-pay | Admitting: Emergency Medicine

## 2006-09-13 ENCOUNTER — Encounter (INDEPENDENT_AMBULATORY_CARE_PROVIDER_SITE_OTHER): Payer: Self-pay | Admitting: Pulmonary Disease

## 2006-09-13 ENCOUNTER — Ambulatory Visit: Payer: Self-pay | Admitting: Internal Medicine

## 2006-09-13 LAB — CONVERTED CEMR LAB: Sed Rate: 15 mm/hr (ref 0–22)

## 2006-09-25 ENCOUNTER — Ambulatory Visit: Payer: Self-pay | Admitting: Hospitalist

## 2006-09-26 ENCOUNTER — Encounter (INDEPENDENT_AMBULATORY_CARE_PROVIDER_SITE_OTHER): Payer: Self-pay | Admitting: Internal Medicine

## 2006-09-26 LAB — CONVERTED CEMR LAB
Candida species: POSITIVE — AB
Gardnerella vaginalis: POSITIVE — AB
MCHC: 33.6 g/dL (ref 30.0–36.0)
MCV: 94.9 fL (ref 78.0–100.0)
Platelets: 256 10*3/uL (ref 150–400)
RBC: 4.33 M/uL (ref 3.87–5.11)
RDW: 11.9 % (ref 11.5–14.0)

## 2006-09-27 ENCOUNTER — Emergency Department: Payer: Self-pay | Admitting: Internal Medicine

## 2006-10-13 ENCOUNTER — Emergency Department (HOSPITAL_COMMUNITY): Admission: EM | Admit: 2006-10-13 | Discharge: 2006-10-13 | Payer: Self-pay | Admitting: Family Medicine

## 2006-11-09 ENCOUNTER — Emergency Department (HOSPITAL_COMMUNITY): Admission: EM | Admit: 2006-11-09 | Discharge: 2006-11-09 | Payer: Self-pay | Admitting: Emergency Medicine

## 2006-11-11 ENCOUNTER — Encounter (INDEPENDENT_AMBULATORY_CARE_PROVIDER_SITE_OTHER): Payer: Self-pay | Admitting: *Deleted

## 2006-11-11 ENCOUNTER — Telehealth: Payer: Self-pay | Admitting: *Deleted

## 2006-11-11 ENCOUNTER — Ambulatory Visit: Payer: Self-pay | Admitting: Hospitalist

## 2006-11-12 ENCOUNTER — Inpatient Hospital Stay (HOSPITAL_COMMUNITY): Admission: RE | Admit: 2006-11-12 | Discharge: 2006-11-17 | Payer: Self-pay | Admitting: Gastroenterology

## 2006-11-12 ENCOUNTER — Ambulatory Visit: Payer: Self-pay | Admitting: Gastroenterology

## 2006-11-12 LAB — CONVERTED CEMR LAB
Amphetamine Screen, Ur: NEGATIVE
Barbiturate Quant, Ur: NEGATIVE
Beta hcg, urine, semiquantitative: NEGATIVE
Creatinine,U: 120 mg/dL
Marijuana Metabolite: NEGATIVE
Methadone: NEGATIVE
Propoxyphene: POSITIVE — AB

## 2006-11-15 ENCOUNTER — Ambulatory Visit: Payer: Self-pay | Admitting: Internal Medicine

## 2006-11-20 ENCOUNTER — Ambulatory Visit: Payer: Self-pay | Admitting: Obstetrics and Gynecology

## 2006-11-25 ENCOUNTER — Ambulatory Visit: Payer: Self-pay | Admitting: Hospitalist

## 2006-11-26 ENCOUNTER — Ambulatory Visit: Payer: Self-pay | Admitting: Gastroenterology

## 2006-12-05 ENCOUNTER — Emergency Department (HOSPITAL_COMMUNITY): Admission: EM | Admit: 2006-12-05 | Discharge: 2006-12-05 | Payer: Self-pay | Admitting: Emergency Medicine

## 2006-12-09 ENCOUNTER — Emergency Department (HOSPITAL_COMMUNITY): Admission: EM | Admit: 2006-12-09 | Discharge: 2006-12-09 | Payer: Self-pay | Admitting: Family Medicine

## 2006-12-19 ENCOUNTER — Ambulatory Visit: Payer: Self-pay | Admitting: *Deleted

## 2006-12-19 ENCOUNTER — Encounter (INDEPENDENT_AMBULATORY_CARE_PROVIDER_SITE_OTHER): Payer: Self-pay | Admitting: Pulmonary Disease

## 2006-12-19 LAB — CONVERTED CEMR LAB
AST: 24 units/L (ref 0–37)
Alkaline Phosphatase: 51 units/L (ref 39–117)
Basophils Absolute: 0 10*3/uL (ref 0.0–0.1)
Basophils Relative: 0 % (ref 0–1)
Bilirubin Urine: NEGATIVE
CO2: 22 meq/L (ref 19–32)
Calcium: 8.7 mg/dL (ref 8.4–10.5)
Eosinophils Relative: 5 % (ref 0–5)
Glucose, Bld: 79 mg/dL (ref 70–99)
HCT: 41.2 % (ref 36.0–46.0)
Hemoglobin: 14.1 g/dL (ref 12.0–15.0)
Leukocytes, UA: NEGATIVE
Lipase: 32 units/L (ref 11–59)
Lymphs Abs: 1.8 10*3/uL (ref 0.7–3.3)
MCV: 94 fL (ref 78.0–100.0)
Monocytes Relative: 6 % (ref 3–11)
Nitrite: NEGATIVE
Platelets: 265 10*3/uL (ref 150–400)
Protein, ur: NEGATIVE mg/dL
Total Bilirubin: 0.6 mg/dL (ref 0.3–1.2)
WBC: 7.4 10*3/uL (ref 4.0–10.5)
pH: 8 (ref 5.0–8.0)

## 2007-01-03 ENCOUNTER — Telehealth (INDEPENDENT_AMBULATORY_CARE_PROVIDER_SITE_OTHER): Payer: Self-pay | Admitting: *Deleted

## 2007-02-03 ENCOUNTER — Inpatient Hospital Stay (HOSPITAL_COMMUNITY): Admission: AD | Admit: 2007-02-03 | Discharge: 2007-02-03 | Payer: Self-pay | Admitting: Obstetrics & Gynecology

## 2007-02-25 ENCOUNTER — Encounter (INDEPENDENT_AMBULATORY_CARE_PROVIDER_SITE_OTHER): Payer: Self-pay | Admitting: *Deleted

## 2007-02-25 ENCOUNTER — Encounter: Payer: Self-pay | Admitting: *Deleted

## 2007-02-25 ENCOUNTER — Ambulatory Visit: Payer: Self-pay | Admitting: Internal Medicine

## 2007-02-25 LAB — CONVERTED CEMR LAB
Bilirubin Urine: NEGATIVE
Calcium: 9.3 mg/dL (ref 8.4–10.5)
Chloride: 104 meq/L (ref 96–112)
Creatinine, Ser: 0.68 mg/dL (ref 0.40–1.20)
Glucose, Urine, Semiquant: NEGATIVE
Ketones, urine, test strip: NEGATIVE
Potassium: 4.3 meq/L (ref 3.5–5.3)
Protein, U semiquant: NEGATIVE
pH: 7.5

## 2007-03-07 ENCOUNTER — Telehealth: Payer: Self-pay | Admitting: *Deleted

## 2007-03-25 ENCOUNTER — Emergency Department (HOSPITAL_COMMUNITY): Admission: EM | Admit: 2007-03-25 | Discharge: 2007-03-25 | Payer: Self-pay | Admitting: Emergency Medicine

## 2007-04-16 ENCOUNTER — Ambulatory Visit: Payer: Self-pay | Admitting: Obstetrics and Gynecology

## 2007-05-09 ENCOUNTER — Emergency Department (HOSPITAL_COMMUNITY): Admission: EM | Admit: 2007-05-09 | Discharge: 2007-05-09 | Payer: Self-pay | Admitting: Family Medicine

## 2007-05-20 ENCOUNTER — Telehealth (INDEPENDENT_AMBULATORY_CARE_PROVIDER_SITE_OTHER): Payer: Self-pay | Admitting: *Deleted

## 2007-06-13 ENCOUNTER — Ambulatory Visit: Payer: Self-pay | Admitting: Internal Medicine

## 2007-06-13 ENCOUNTER — Encounter: Payer: Self-pay | Admitting: *Deleted

## 2007-06-16 ENCOUNTER — Telehealth: Payer: Self-pay | Admitting: *Deleted

## 2007-07-12 ENCOUNTER — Emergency Department (HOSPITAL_COMMUNITY): Admission: EM | Admit: 2007-07-12 | Discharge: 2007-07-12 | Payer: Self-pay | Admitting: Emergency Medicine

## 2007-07-15 ENCOUNTER — Encounter (INDEPENDENT_AMBULATORY_CARE_PROVIDER_SITE_OTHER): Payer: Self-pay | Admitting: *Deleted

## 2007-07-15 ENCOUNTER — Ambulatory Visit: Payer: Self-pay | Admitting: Hospitalist

## 2007-07-15 LAB — CONVERTED CEMR LAB: TSH: 2.236 microintl units/mL (ref 0.350–5.50)

## 2007-07-21 ENCOUNTER — Ambulatory Visit: Payer: Self-pay | Admitting: Hospitalist

## 2007-07-21 ENCOUNTER — Encounter (INDEPENDENT_AMBULATORY_CARE_PROVIDER_SITE_OTHER): Payer: Self-pay | Admitting: *Deleted

## 2007-09-20 ENCOUNTER — Telehealth (INDEPENDENT_AMBULATORY_CARE_PROVIDER_SITE_OTHER): Payer: Self-pay | Admitting: *Deleted

## 2007-09-20 ENCOUNTER — Emergency Department (HOSPITAL_COMMUNITY): Admission: EM | Admit: 2007-09-20 | Discharge: 2007-09-20 | Payer: Self-pay | Admitting: Emergency Medicine

## 2007-09-23 ENCOUNTER — Ambulatory Visit: Payer: Self-pay | Admitting: Infectious Disease

## 2007-09-23 ENCOUNTER — Encounter (INDEPENDENT_AMBULATORY_CARE_PROVIDER_SITE_OTHER): Payer: Self-pay | Admitting: Internal Medicine

## 2007-09-24 LAB — CONVERTED CEMR LAB
ALT: 12 units/L (ref 0–35)
Albumin: 4.7 g/dL (ref 3.5–5.2)
Alkaline Phosphatase: 65 units/L (ref 39–117)
BUN: 9 mg/dL (ref 6–23)
Basophils Absolute: 0 10*3/uL (ref 0.0–0.1)
CO2: 21 meq/L (ref 19–32)
Chloride: 105 meq/L (ref 96–112)
Hemoglobin, Urine: NEGATIVE
Ketones, ur: NEGATIVE mg/dL
Leukocytes, UA: NEGATIVE
Lymphocytes Relative: 23 % (ref 12–46)
Lymphs Abs: 2 10*3/uL (ref 0.7–4.0)
MCHC: 33 g/dL (ref 30.0–36.0)
MCV: 94.3 fL (ref 78.0–100.0)
Monocytes Absolute: 0.6 10*3/uL (ref 0.1–1.0)
Monocytes Relative: 7 % (ref 3–12)
Neutro Abs: 5.5 10*3/uL (ref 1.7–7.7)
Potassium: 4.6 meq/L (ref 3.5–5.3)
Protein, ur: NEGATIVE mg/dL
RBC: 4.76 M/uL (ref 3.87–5.11)
Sodium: 140 meq/L (ref 135–145)
Total Bilirubin: 0.4 mg/dL (ref 0.3–1.2)
Total CK: 40 units/L (ref 7–177)
Total Protein: 7.6 g/dL (ref 6.0–8.3)
Urine Glucose: NEGATIVE mg/dL

## 2007-09-25 ENCOUNTER — Ambulatory Visit: Payer: Self-pay | Admitting: Internal Medicine

## 2007-09-25 ENCOUNTER — Encounter (INDEPENDENT_AMBULATORY_CARE_PROVIDER_SITE_OTHER): Payer: Self-pay | Admitting: Internal Medicine

## 2007-09-25 LAB — CONVERTED CEMR LAB
Basophils Relative: 0 % (ref 0–1)
Bilirubin Urine: NEGATIVE
EBV NA IgG: 5.29 — ABNORMAL HIGH
EBV VCA IgG: 3.68 — ABNORMAL HIGH
EBV VCA IgM: 0.56
Leukocytes, UA: NEGATIVE
Lymphocytes Relative: 29 % (ref 12–46)
Lymphs Abs: 2.4 10*3/uL (ref 0.7–4.0)
MCV: 93 fL (ref 78.0–100.0)
Neutro Abs: 5 10*3/uL (ref 1.7–7.7)
Total CK: 36 units/L (ref 7–177)
Urine Glucose: NEGATIVE mg/dL
Urobilinogen, UA: 0.2 (ref 0.0–1.0)
WBC: 8.4 10*3/uL (ref 4.0–10.5)

## 2007-09-30 ENCOUNTER — Ambulatory Visit: Payer: Self-pay | Admitting: Internal Medicine

## 2007-09-30 ENCOUNTER — Encounter (INDEPENDENT_AMBULATORY_CARE_PROVIDER_SITE_OTHER): Payer: Self-pay | Admitting: *Deleted

## 2007-09-30 ENCOUNTER — Encounter (INDEPENDENT_AMBULATORY_CARE_PROVIDER_SITE_OTHER): Payer: Self-pay | Admitting: Internal Medicine

## 2007-10-07 ENCOUNTER — Telehealth (INDEPENDENT_AMBULATORY_CARE_PROVIDER_SITE_OTHER): Payer: Self-pay | Admitting: Internal Medicine

## 2007-10-29 ENCOUNTER — Ambulatory Visit: Payer: Self-pay | Admitting: Internal Medicine

## 2007-10-29 ENCOUNTER — Encounter (INDEPENDENT_AMBULATORY_CARE_PROVIDER_SITE_OTHER): Payer: Self-pay | Admitting: *Deleted

## 2007-10-29 LAB — CONVERTED CEMR LAB
Bilirubin Urine: NEGATIVE
Hemoglobin, Urine: NEGATIVE
Ketones, ur: NEGATIVE mg/dL
Leukocytes, UA: NEGATIVE
Nitrite: NEGATIVE
Protein, ur: NEGATIVE mg/dL
Specific Gravity, Urine: 1.004 — ABNORMAL LOW (ref 1.005–1.03)
Urine Glucose: NEGATIVE mg/dL
Urobilinogen, UA: 0.2 (ref 0.0–1.0)
pH: 7.5 (ref 5.0–8.0)

## 2007-11-21 ENCOUNTER — Emergency Department (HOSPITAL_COMMUNITY): Admission: EM | Admit: 2007-11-21 | Discharge: 2007-11-21 | Payer: Self-pay | Admitting: Emergency Medicine

## 2007-12-03 ENCOUNTER — Encounter (INDEPENDENT_AMBULATORY_CARE_PROVIDER_SITE_OTHER): Payer: Self-pay | Admitting: *Deleted

## 2007-12-03 ENCOUNTER — Ambulatory Visit: Payer: Self-pay | Admitting: Hospitalist

## 2007-12-03 LAB — CONVERTED CEMR LAB
Alkaline Phosphatase: 50 units/L (ref 39–117)
CO2: 25 meq/L (ref 19–32)
Creatinine, Ser: 0.6 mg/dL (ref 0.40–1.20)
Eosinophils Absolute: 0.4 10*3/uL (ref 0.0–0.7)
Eosinophils Relative: 4 % (ref 0–5)
Glucose, Bld: 77 mg/dL (ref 70–99)
HCT: 43.3 % (ref 36.0–46.0)
Hemoglobin: 14.4 g/dL (ref 12.0–15.0)
Lipase: 40 units/L (ref 0–75)
Lymphs Abs: 3.2 10*3/uL (ref 0.7–4.0)
MCV: 94.7 fL (ref 78.0–100.0)
Monocytes Absolute: 0.7 10*3/uL (ref 0.1–1.0)
Platelets: 267 10*3/uL (ref 150–400)
RDW: 12.3 % (ref 11.5–15.5)
Sodium: 140 meq/L (ref 135–145)
Total Bilirubin: 0.4 mg/dL (ref 0.3–1.2)
Total Protein: 7.3 g/dL (ref 6.0–8.3)
WBC: 9.3 10*3/uL (ref 4.0–10.5)

## 2007-12-13 ENCOUNTER — Telehealth (INDEPENDENT_AMBULATORY_CARE_PROVIDER_SITE_OTHER): Payer: Self-pay | Admitting: *Deleted

## 2007-12-13 ENCOUNTER — Emergency Department (HOSPITAL_COMMUNITY): Admission: EM | Admit: 2007-12-13 | Discharge: 2007-12-13 | Payer: Self-pay | Admitting: Emergency Medicine

## 2007-12-22 ENCOUNTER — Ambulatory Visit: Payer: Self-pay | Admitting: Infectious Disease

## 2007-12-22 ENCOUNTER — Encounter (INDEPENDENT_AMBULATORY_CARE_PROVIDER_SITE_OTHER): Payer: Self-pay | Admitting: *Deleted

## 2007-12-22 LAB — CONVERTED CEMR LAB
Hemoglobin, Urine: NEGATIVE
Leukocytes, UA: NEGATIVE
Nitrite: NEGATIVE
Specific Gravity, Urine: 1.007 (ref 1.005–1.03)
Urine Glucose: NEGATIVE mg/dL
pH: 6 (ref 5.0–8.0)

## 2007-12-26 ENCOUNTER — Ambulatory Visit (HOSPITAL_COMMUNITY): Admission: RE | Admit: 2007-12-26 | Discharge: 2007-12-26 | Payer: Self-pay | Admitting: Family Medicine

## 2008-01-08 ENCOUNTER — Ambulatory Visit: Payer: Self-pay | Admitting: Internal Medicine

## 2008-01-13 ENCOUNTER — Ambulatory Visit: Payer: Self-pay | Admitting: Gastroenterology

## 2008-01-19 ENCOUNTER — Telehealth: Payer: Self-pay | Admitting: Gastroenterology

## 2008-01-26 ENCOUNTER — Telehealth: Payer: Self-pay | Admitting: Gastroenterology

## 2008-01-27 ENCOUNTER — Ambulatory Visit (HOSPITAL_COMMUNITY): Admission: RE | Admit: 2008-01-27 | Discharge: 2008-01-27 | Payer: Self-pay | Admitting: Gastroenterology

## 2008-01-28 ENCOUNTER — Telehealth: Payer: Self-pay | Admitting: Gastroenterology

## 2008-01-29 ENCOUNTER — Telehealth: Payer: Self-pay | Admitting: Gastroenterology

## 2008-01-29 ENCOUNTER — Emergency Department (HOSPITAL_COMMUNITY): Admission: EM | Admit: 2008-01-29 | Discharge: 2008-01-29 | Payer: Self-pay | Admitting: Emergency Medicine

## 2008-04-06 ENCOUNTER — Emergency Department (HOSPITAL_COMMUNITY): Admission: EM | Admit: 2008-04-06 | Discharge: 2008-04-06 | Payer: Self-pay | Admitting: Emergency Medicine

## 2008-04-09 ENCOUNTER — Ambulatory Visit: Payer: Self-pay | Admitting: Internal Medicine

## 2008-04-09 ENCOUNTER — Encounter: Payer: Self-pay | Admitting: Internal Medicine

## 2008-04-09 LAB — CONVERTED CEMR LAB
ALT: 10 units/L (ref 0–35)
AST: 18 units/L (ref 0–37)
Albumin: 4.2 g/dL (ref 3.5–5.2)
BUN: 8 mg/dL (ref 6–23)
Basophils Relative: 0 % (ref 0–1)
CO2: 30 meq/L (ref 19–32)
Calcium: 9.5 mg/dL (ref 8.4–10.5)
Chloride: 106 meq/L (ref 96–112)
Creatinine, Ser: 0.6 mg/dL (ref 0.40–1.20)
Hemoglobin: 14.3 g/dL (ref 12.0–15.0)
Lymphocytes Relative: 31 % (ref 12–46)
MCHC: 33.4 g/dL (ref 30.0–36.0)
Monocytes Absolute: 0.5 10*3/uL (ref 0.1–1.0)
Monocytes Relative: 6 % (ref 3–12)
Neutro Abs: 4.7 10*3/uL (ref 1.7–7.7)
Neutrophils Relative %: 59 % (ref 43–77)
Potassium: 4.1 meq/L (ref 3.5–5.3)
RBC: 4.5 M/uL (ref 3.87–5.11)
TSH: 1.749 microintl units/mL (ref 0.350–4.50)
WBC: 8 10*3/uL (ref 4.0–10.5)

## 2008-04-19 ENCOUNTER — Ambulatory Visit (HOSPITAL_COMMUNITY): Admission: RE | Admit: 2008-04-19 | Discharge: 2008-04-19 | Payer: Self-pay | Admitting: Internal Medicine

## 2008-05-03 ENCOUNTER — Telehealth: Payer: Self-pay | Admitting: Internal Medicine

## 2008-05-04 ENCOUNTER — Telehealth (INDEPENDENT_AMBULATORY_CARE_PROVIDER_SITE_OTHER): Payer: Self-pay | Admitting: *Deleted

## 2008-05-10 ENCOUNTER — Ambulatory Visit: Payer: Self-pay | Admitting: Internal Medicine

## 2008-05-10 ENCOUNTER — Encounter: Payer: Self-pay | Admitting: Internal Medicine

## 2008-05-10 DIAGNOSIS — F329 Major depressive disorder, single episode, unspecified: Secondary | ICD-10-CM

## 2008-05-10 LAB — CONVERTED CEMR LAB: FSH: 44 milliintl units/mL

## 2008-05-27 ENCOUNTER — Ambulatory Visit: Payer: Self-pay | Admitting: Internal Medicine

## 2008-05-27 ENCOUNTER — Encounter (INDEPENDENT_AMBULATORY_CARE_PROVIDER_SITE_OTHER): Payer: Self-pay | Admitting: *Deleted

## 2008-05-27 LAB — CONVERTED CEMR LAB
Bilirubin Urine: NEGATIVE
Glucose, Urine, Semiquant: NEGATIVE
Hemoglobin, Urine: NEGATIVE
Nitrite: NEGATIVE
Protein, ur: NEGATIVE mg/dL
Specific Gravity, Urine: 1.01
Urine Glucose: NEGATIVE mg/dL
Urobilinogen, UA: 0.2
Urobilinogen, UA: 0.2 (ref 0.0–1.0)
pH: 7.5

## 2008-06-01 ENCOUNTER — Telehealth: Payer: Self-pay | Admitting: Gastroenterology

## 2008-06-03 ENCOUNTER — Encounter: Payer: Self-pay | Admitting: Licensed Clinical Social Worker

## 2008-06-16 ENCOUNTER — Emergency Department (HOSPITAL_COMMUNITY): Admission: EM | Admit: 2008-06-16 | Discharge: 2008-06-16 | Payer: Self-pay | Admitting: Emergency Medicine

## 2008-06-18 ENCOUNTER — Ambulatory Visit (HOSPITAL_COMMUNITY): Admission: RE | Admit: 2008-06-18 | Discharge: 2008-06-18 | Payer: Self-pay | Admitting: *Deleted

## 2008-07-07 ENCOUNTER — Ambulatory Visit: Payer: Self-pay | Admitting: Obstetrics and Gynecology

## 2008-07-07 ENCOUNTER — Ambulatory Visit (HOSPITAL_COMMUNITY): Admission: RE | Admit: 2008-07-07 | Discharge: 2008-07-07 | Payer: Self-pay | Admitting: Family Medicine

## 2008-07-08 ENCOUNTER — Encounter: Payer: Self-pay | Admitting: Gastroenterology

## 2008-07-22 ENCOUNTER — Emergency Department (HOSPITAL_COMMUNITY): Admission: EM | Admit: 2008-07-22 | Discharge: 2008-07-22 | Payer: Self-pay | Admitting: Family Medicine

## 2008-08-02 ENCOUNTER — Ambulatory Visit: Payer: Self-pay | Admitting: Infectious Diseases

## 2008-08-02 ENCOUNTER — Encounter: Payer: Self-pay | Admitting: Internal Medicine

## 2008-08-02 LAB — CONVERTED CEMR LAB
HCT: 44.4 % (ref 36.0–46.0)
MCHC: 32.4 g/dL (ref 30.0–36.0)
MCV: 94.5 fL (ref 78.0–100.0)
Platelets: 257 10*3/uL (ref 150–400)
RDW: 12.2 % (ref 11.5–15.5)

## 2008-08-06 ENCOUNTER — Telehealth: Payer: Self-pay | Admitting: Internal Medicine

## 2008-08-17 ENCOUNTER — Telehealth (INDEPENDENT_AMBULATORY_CARE_PROVIDER_SITE_OTHER): Payer: Self-pay | Admitting: *Deleted

## 2008-08-17 ENCOUNTER — Ambulatory Visit: Payer: Self-pay | Admitting: Internal Medicine

## 2008-08-29 ENCOUNTER — Telehealth (INDEPENDENT_AMBULATORY_CARE_PROVIDER_SITE_OTHER): Payer: Self-pay | Admitting: Internal Medicine

## 2008-09-14 ENCOUNTER — Ambulatory Visit: Payer: Self-pay | Admitting: Infectious Diseases

## 2008-09-27 ENCOUNTER — Ambulatory Visit (HOSPITAL_COMMUNITY): Admission: RE | Admit: 2008-09-27 | Discharge: 2008-09-27 | Payer: Self-pay | Admitting: Internal Medicine

## 2008-09-27 ENCOUNTER — Encounter: Payer: Self-pay | Admitting: Internal Medicine

## 2008-09-27 ENCOUNTER — Ambulatory Visit: Payer: Self-pay | Admitting: Internal Medicine

## 2008-09-27 LAB — CONVERTED CEMR LAB
ALT: 9 units/L (ref 0–35)
Basophils Absolute: 0 10*3/uL (ref 0.0–0.1)
CO2: 21 meq/L (ref 19–32)
Chloride: 104 meq/L (ref 96–112)
Eosinophils Relative: 2 % (ref 0–5)
Lymphocytes Relative: 41 % (ref 12–46)
Neutro Abs: 3.8 10*3/uL (ref 1.7–7.7)
Neutrophils Relative %: 52 % (ref 43–77)
Platelets: 265 10*3/uL (ref 150–400)
Potassium: 4.6 meq/L (ref 3.5–5.3)
RDW: 12.1 % (ref 11.5–15.5)
Sodium: 141 meq/L (ref 135–145)
TSH: 1.697 microintl units/mL (ref 0.350–4.50)
Total Bilirubin: 0.5 mg/dL (ref 0.3–1.2)
Total Protein: 7.6 g/dL (ref 6.0–8.3)

## 2008-11-04 ENCOUNTER — Ambulatory Visit: Payer: Self-pay | Admitting: Internal Medicine

## 2008-11-04 ENCOUNTER — Encounter (INDEPENDENT_AMBULATORY_CARE_PROVIDER_SITE_OTHER): Payer: Self-pay | Admitting: *Deleted

## 2008-11-04 LAB — CONVERTED CEMR LAB
Alkaline Phosphatase: 47 units/L (ref 39–117)
BUN: 7 mg/dL (ref 6–23)
Bilirubin Urine: NEGATIVE
Eosinophils Absolute: 0.5 10*3/uL (ref 0.0–0.7)
Eosinophils Relative: 5 % (ref 0–5)
Glucose, Bld: 92 mg/dL (ref 70–99)
HCT: 42.2 % (ref 36.0–46.0)
Lymphs Abs: 3.7 10*3/uL (ref 0.7–4.0)
MCV: 95.3 fL (ref 78.0–100.0)
Monocytes Relative: 6 % (ref 3–12)
Platelets: 287 10*3/uL (ref 150–400)
RBC: 4.43 M/uL (ref 3.87–5.11)
Specific Gravity, Urine: 1.007 (ref 1.005–1.03)
Total Bilirubin: 0.5 mg/dL (ref 0.3–1.2)
Urine Glucose: NEGATIVE mg/dL
WBC: 9.5 10*3/uL (ref 4.0–10.5)

## 2008-11-05 ENCOUNTER — Encounter (INDEPENDENT_AMBULATORY_CARE_PROVIDER_SITE_OTHER): Payer: Self-pay | Admitting: *Deleted

## 2008-11-05 ENCOUNTER — Ambulatory Visit: Payer: Self-pay | Admitting: *Deleted

## 2009-01-11 ENCOUNTER — Emergency Department (HOSPITAL_COMMUNITY): Admission: EM | Admit: 2009-01-11 | Discharge: 2009-01-11 | Payer: Self-pay | Admitting: Emergency Medicine

## 2009-02-16 ENCOUNTER — Ambulatory Visit: Payer: Self-pay | Admitting: Internal Medicine

## 2009-02-16 ENCOUNTER — Telehealth: Payer: Self-pay | Admitting: Internal Medicine

## 2009-06-10 ENCOUNTER — Emergency Department (HOSPITAL_COMMUNITY): Admission: EM | Admit: 2009-06-10 | Discharge: 2009-06-10 | Payer: Self-pay | Admitting: Emergency Medicine

## 2009-06-13 ENCOUNTER — Ambulatory Visit: Payer: Self-pay | Admitting: Internal Medicine

## 2009-06-13 LAB — CONVERTED CEMR LAB
Cholesterol: 185 mg/dL (ref 0–200)
Total CHOL/HDL Ratio: 5
Triglycerides: 153 mg/dL — ABNORMAL HIGH (ref <150)
VLDL: 31 mg/dL (ref 0–40)

## 2009-06-14 ENCOUNTER — Encounter: Payer: Self-pay | Admitting: Infectious Diseases

## 2009-06-15 ENCOUNTER — Telehealth: Payer: Self-pay | Admitting: Internal Medicine

## 2009-06-16 ENCOUNTER — Encounter (INDEPENDENT_AMBULATORY_CARE_PROVIDER_SITE_OTHER): Payer: Self-pay | Admitting: Internal Medicine

## 2009-06-16 ENCOUNTER — Ambulatory Visit: Payer: Self-pay | Admitting: Infectious Diseases

## 2009-06-16 LAB — CONVERTED CEMR LAB
AST: 12 units/L (ref 0–37)
BUN: 5 mg/dL — ABNORMAL LOW (ref 6–23)
Basophils Relative: 0 % (ref 0–1)
CO2: 21 meq/L (ref 19–32)
Calcium: 9.1 mg/dL (ref 8.4–10.5)
Chloride: 104 meq/L (ref 96–112)
Creatinine, Ser: 0.69 mg/dL (ref 0.40–1.20)
Eosinophils Absolute: 0.4 10*3/uL (ref 0.0–0.7)
Eosinophils Relative: 5 % (ref 0–5)
Glucose, Bld: 94 mg/dL (ref 70–99)
Leukocytes, UA: NEGATIVE
MCHC: 33.1 g/dL (ref 30.0–36.0)
MCV: 94.3 fL (ref >=78.0)
Monocytes Relative: 7 % (ref 3–12)
Neutrophils Relative %: 47 % (ref 43–77)
Nitrite: NEGATIVE
Platelets: 236 10*3/uL (ref 150–400)
Protein, ur: NEGATIVE mg/dL
RBC: 4.39 M/uL (ref 3.87–5.11)
Specific Gravity, Urine: 1.006 (ref 1.005–1.0)
TSH: 2.019 microintl units/mL (ref 0.350–4.5)
Urobilinogen, UA: 0.2 (ref 0.0–1.0)

## 2009-06-17 ENCOUNTER — Encounter: Payer: Self-pay | Admitting: Infectious Diseases

## 2009-07-18 ENCOUNTER — Telehealth (INDEPENDENT_AMBULATORY_CARE_PROVIDER_SITE_OTHER): Payer: Self-pay | Admitting: *Deleted

## 2009-07-19 ENCOUNTER — Ambulatory Visit: Payer: Self-pay | Admitting: Infectious Disease

## 2009-07-21 ENCOUNTER — Ambulatory Visit: Payer: Self-pay | Admitting: Internal Medicine

## 2009-09-02 ENCOUNTER — Emergency Department (HOSPITAL_COMMUNITY): Admission: EM | Admit: 2009-09-02 | Discharge: 2009-09-02 | Payer: Self-pay | Admitting: Emergency Medicine

## 2009-09-29 ENCOUNTER — Ambulatory Visit: Payer: Self-pay | Admitting: Obstetrics and Gynecology

## 2009-09-29 LAB — CONVERTED CEMR LAB
FSH: 89.8 milliintl units/mL
LH: 62.3 milliintl units/mL

## 2009-11-07 ENCOUNTER — Telehealth: Payer: Self-pay | Admitting: Internal Medicine

## 2009-11-07 ENCOUNTER — Ambulatory Visit: Payer: Self-pay | Admitting: Internal Medicine

## 2009-11-08 ENCOUNTER — Ambulatory Visit: Payer: Self-pay | Admitting: Internal Medicine

## 2009-11-08 ENCOUNTER — Telehealth: Payer: Self-pay | Admitting: *Deleted

## 2009-12-01 ENCOUNTER — Ambulatory Visit: Payer: Self-pay | Admitting: Obstetrics and Gynecology

## 2009-12-05 ENCOUNTER — Ambulatory Visit (HOSPITAL_COMMUNITY): Admission: RE | Admit: 2009-12-05 | Discharge: 2009-12-05 | Payer: Self-pay | Admitting: Family Medicine

## 2009-12-07 ENCOUNTER — Ambulatory Visit: Payer: Self-pay | Admitting: Internal Medicine

## 2010-01-27 ENCOUNTER — Inpatient Hospital Stay (HOSPITAL_COMMUNITY): Admission: AD | Admit: 2010-01-27 | Discharge: 2010-01-27 | Payer: Self-pay | Admitting: Obstetrics & Gynecology

## 2010-01-30 ENCOUNTER — Encounter: Admission: RE | Admit: 2010-01-30 | Discharge: 2010-01-30 | Payer: Self-pay | Admitting: Obstetrics and Gynecology

## 2010-02-01 ENCOUNTER — Ambulatory Visit: Payer: Self-pay | Admitting: Obstetrics and Gynecology

## 2010-02-02 ENCOUNTER — Encounter: Payer: Self-pay | Admitting: Obstetrics and Gynecology

## 2010-05-16 ENCOUNTER — Emergency Department (HOSPITAL_COMMUNITY): Admission: EM | Admit: 2010-05-16 | Discharge: 2010-05-16 | Payer: Self-pay | Admitting: Emergency Medicine

## 2010-05-25 ENCOUNTER — Emergency Department (HOSPITAL_COMMUNITY): Admission: EM | Admit: 2010-05-25 | Discharge: 2010-05-25 | Payer: Self-pay | Admitting: Family Medicine

## 2010-05-25 ENCOUNTER — Telehealth: Payer: Self-pay | Admitting: Internal Medicine

## 2010-06-01 ENCOUNTER — Ambulatory Visit: Payer: Self-pay | Admitting: Internal Medicine

## 2010-06-01 LAB — CONVERTED CEMR LAB
Bilirubin Urine: NEGATIVE
Blood in Urine, dipstick: NEGATIVE
Crystals: NONE SEEN
Glucose, Urine, Semiquant: NEGATIVE
Ketones, urine, test strip: NEGATIVE
Nitrite: NEGATIVE
Protein, ur: NEGATIVE mg/dL
Specific Gravity, Urine: 1.006 (ref 1.005–1.0)
Squamous Epithelial / LPF: NONE SEEN /lpf
Urine Glucose: NEGATIVE mg/dL
Urobilinogen, UA: 0.2 (ref 0.0–1.0)

## 2010-06-01 LAB — HM MAMMOGRAPHY: HM Mammogram: NORMAL

## 2010-06-30 ENCOUNTER — Emergency Department (HOSPITAL_COMMUNITY): Admission: EM | Admit: 2010-06-30 | Discharge: 2010-06-30 | Payer: Self-pay | Admitting: Family Medicine

## 2010-07-12 ENCOUNTER — Emergency Department (HOSPITAL_COMMUNITY): Admission: EM | Admit: 2010-07-12 | Discharge: 2010-07-12 | Payer: Self-pay | Admitting: Emergency Medicine

## 2010-08-23 ENCOUNTER — Ambulatory Visit: Payer: Self-pay | Admitting: Internal Medicine

## 2010-09-16 ENCOUNTER — Emergency Department (HOSPITAL_COMMUNITY)
Admission: EM | Admit: 2010-09-16 | Discharge: 2010-09-16 | Payer: Self-pay | Source: Home / Self Care | Admitting: Emergency Medicine

## 2010-09-27 ENCOUNTER — Ambulatory Visit: Admission: RE | Admit: 2010-09-27 | Discharge: 2010-09-27 | Payer: Self-pay | Source: Home / Self Care

## 2010-09-27 LAB — CONVERTED CEMR LAB
ALT: 8 units/L (ref 0–35)
AST: 17 units/L (ref 0–37)
Anti Nuclear Antibody(ANA): NEGATIVE
Candida species: NEGATIVE
Creatinine, Ser: 0.66 mg/dL (ref 0.40–1.20)
Glucose, Urine, Semiquant: NEGATIVE
Pro B Natriuretic peptide (BNP): <30 pg/mL (ref 0.0–100.0)
Specific Gravity, Urine: <1.005
Total Bilirubin: 0.4 mg/dL (ref 0.3–1.2)
WBC Urine, dipstick: NEGATIVE
pH: 6.5

## 2010-10-08 ENCOUNTER — Encounter: Payer: Self-pay | Admitting: Gastroenterology

## 2010-10-09 ENCOUNTER — Encounter: Payer: Self-pay | Admitting: Internal Medicine

## 2010-10-09 ENCOUNTER — Encounter: Payer: Self-pay | Admitting: *Deleted

## 2010-10-12 ENCOUNTER — Ambulatory Visit
Admission: RE | Admit: 2010-10-12 | Discharge: 2010-10-12 | Payer: Self-pay | Source: Home / Self Care | Attending: Obstetrics and Gynecology | Admitting: Obstetrics and Gynecology

## 2010-10-13 NOTE — Progress Notes (Unsigned)
NAMEDARIS, ARISTIZABAL NO.:  [id number]  MEDICAL RECORD NO.:  [id number]          PATIENT TYPE:  WOC  LOCATION:  [facility]                   FACILITY:  [facility]  PHYSICIAN:  Argentina Donovan, MD        DATE OF BIRTH:  September 27, 1970  DATE OF SERVICE:  10/12/2010                                 CLINIC NOTE  The patient is a 40 year old white female who underwent total abdominal hysterectomy and left salpingo-oophorectomy in 2005.  Since then, we have been trying to control her postmenopausal symptoms, which really did not start until several years after that by varying the amount of estrogen supplement that she was taking, first starting her on it and then varying dosage.  Last couple of years, she has been on about 2 mg. One of the problems she was having was breast tenderness and galactorrhea, that has stopped, but now on 2 mg, she has had a big problem with weight gain, which had been a problem prior to this, and she still is having difficult sleep problems.  I am not actually sure that it was due to the estrogen deprivation.  She tried to go on to 1 mg of estradiol and was unable to tolerate that, so she was back up to 2, and the weight problem and the sleep problems persisted, although the breast tenderness did not come back.  What we are going to try and do is get her off of that completely.  I talked to her about Prozac, but she states she does poorly with SSRIs, so for the meantime, I am going to put her on Lo Loestrin on a continuous basis, have her come back in 2 months, and see how she has been doing on it.  I gave her a couple of samples.  I have her cut out the estradiol completely.  If the Lo Loestrin is not enough to handle what is going on with her, then she can go back to 1 mg of estradiol and see how she does on that in addition to the Lo Loestrin.  She has no uterus, so we do not have to worry about bleeding, and she can try different things until we hit  something that is going to work for her.  IMPRESSION:  Menopausal symptoms.          ______________________________ Argentina Donovan, MD    PR/MEDQ  D:  10/12/2010  T:  10/13/2010  Job:  [id number]

## 2010-10-17 NOTE — Progress Notes (Signed)
Summary: phone call;gp  Phone Note Call from Patient   Caller: Patient Summary of Call: Pt. states eyes, hands, legs are swollen; stomachache and having difficulty taking a deep breath.  She has not taken any new medications. She wants to be seen this morning.  Appt. given this am with Dr. Scot Dock @ 1000am. Initial call taken by: Chinita Pester RN,  November 07, 2009 8:51 AM

## 2010-10-17 NOTE — Assessment & Plan Note (Signed)
Summary: ACUTE-THINKS SHE HAS EAR INFECTION/JAW LINE SWOLLEN HURTS TO ...   Vital Signs:  Patient profile:   40 year old female Height:      60 inches (152.40 cm) Weight:      162.1 pounds (73.68 kg) BMI:     31.77 Temp:     98.8 degrees F (37.11 degrees C) oral Pulse rate:   84 / minute BP sitting:   114 / 77  (right arm) Cuff size:   regular  Vitals Entered By: Krystal Eaton Duncan Dull) (December 07, 2009 2:41 PM) CC: pt c/o headache for 4 days, now c/o swelling/pain right ear and right side of neck swollen, Depression Is Patient Diabetic? No Nutritional Status BMI of > 30 = obese  Have you ever been in a relationship where you felt threatened, hurt or afraid?No   Does patient need assistance? Functional Status Self care Ambulation Normal   Primary Care Provider:  Brooks Sailors MD  CC:  pt c/o headache for 4 days, now c/o swelling/pain right ear and right side of neck swollen, and Depression.  History of Present Illness: Donna Davis is 40 year old woman with complicated PMH most notable for Bilateral hearing loss and lymphadenopathy(cervical and axillary) is here today for new onset headache.  She says that headcahe started 4 days ago, present on the right side of her head, 8/10 at its worse and 8/10 currently, gradually progressive,  no aggravating or relieving factors not a/w nausea/vomiting, feres or chills. She does complain that she has been sore throt since yesterday especially while eating.  The headache is one of her usual headaches due to ear infection and she usually takes Z pack and darvocet to get better.  No other complaints at this time.  Depression History:      The patient denies a depressed mood most of the day and a diminished interest in her usual daily activities.         Preventive Screening-Counseling & Management  Alcohol-Tobacco     Smoking Status: quit     Smoking Cessation Counseling: yes     Packs/Day: 1     Year Started: NINE YEARS AGO     Year  Quit: 2010     Passive Smoke Exposure: no  Problems Prior to Update: 1)  Abdominal Pain, Generalized  (ICD-789.07) 2)  Shortness of Breath  (ICD-786.05) 3)  Otitis Externa, Acute, Bilateral  (ICD-380.12) 4)  Diarrhea  (ICD-787.91) 5)  Frequency, Urinary  (ICD-788.41) 6)  Lymphadenopathy  (ICD-785.6) 7)  Acute Sinusitis, Unspecified  (ICD-461.9) 8)  Preventive Health Care  (ICD-V70.0) 9)  Depression  (ICD-311) 10)  Symptom, Headache  (ICD-784.0) 11)  Skin Rash  (ICD-782.1) 12)  Abdominal Pain Right Upper Quadrant  (ICD-789.01) 13)  Unspecified Visual Loss  (ICD-369.9) 14)  Arm Pain, Right  (ICD-729.5) 15)  Abdominal Pain, Epigastric  (ICD-789.06) 16)  Flank Pain, Left  (ICD-789.09) 17)  Polyuria  (ICD-788.42) 18)  Bipolar Disorder Unspecified  (ICD-296.80) 19)  Neck Pain  (ICD-723.1) 20)  Flank Pain  (ICD-789.09) 21)  Endometriosis, Pelvic Peritoneum  (ICD-617.3) 22)  Ibs  (ICD-564.1) 23)  Hypothyroidism  (ICD-244.9) 24)  Pelvic Pain, Chronic  (ICD-789.09) 25)  Anxiety State Nos  (ICD-300.00) 26)  Loss, Mixed Hearing, Bilateral  (ICD-389.22) 27)  Thigh Pain  (ICD-729.5) 28)  Temporomandibular Joint Pain  (ICD-524.62) 29)  Otitis Externa  (ICD-380.10) 30)  Hip Pain, Bilateral  (ICD-719.45) 31)  Hysterectomy, Hx of  (ICD-V45.77) 32)  Galactorrhea  (ICD-611.6) 33)  Abdominal Adhesions  (ICD-568.0) 34)  Gerd  (ICD-530.81)  Medications Prior to Update: 1)  Levoxyl 25 Mcg Tabs (Levothyroxine Sodium) .... Take 1 Tablet By Mouth Once A Day 2)  Prilosec Otc 20 Mg Tbec (Omeprazole Magnesium) .... Take 1 Tablet By Mouth Once A Day 3)  Promethazine Hcl 25 Mg Tabs (Promethazine Hcl) .Marland Kitchen.. 1 Tab Every 4 Hrs As Needed For Nausea/vominting 4)  Simethicone 80 Mg Chew (Simethicone) .Marland Kitchen.. 1 Tab Daily  Current Medications (verified): 1)  Levoxyl 25 Mcg Tabs (Levothyroxine Sodium) .... Take 1 Tablet By Mouth Once A Day 2)  Prilosec Otc 20 Mg Tbec (Omeprazole Magnesium) .... Take 1 Tablet By  Mouth Once A Day 3)  Promethazine Hcl 25 Mg Tabs (Promethazine Hcl) .Marland Kitchen.. 1 Tab Every 4 Hrs As Needed For Nausea/vominting 4)  Simethicone 80 Mg Chew (Simethicone) .Marland Kitchen.. 1 Tab Daily 5)  Azithromycin 250 Mg Tabs (Azithromycin) .... Take 2 Tabs Today and Then 1 Tba For Next 4 Days. 6)  Hydrocodone-Acetaminophen 5-500 Mg Tabs (Hydrocodone-Acetaminophen) .... Take 1 Tab By Mouth As Needed For Pain Every 6 Hours.  Allergies: 1)  ! Prednisone 2)  ! Sulfa 3)  ! Cipro 4)  ! Augmentin 5)  ! * Onions 6)  ! Ultram 7)  ! Keflex 8)  ! Doxycycline 9)  ! * Cefuroxime 10)  ! Biaxin 11)  ! * Shrimp  Past History:  Past Medical History: Last updated: 12/03/2007 GERD ? bipolar disease Hypothyroidism Irritable bowel syndrome Chronic functional constipation Anxiety Chronic right sided pelvic pain, dyspareunia (followed by Dr. Okey Dupre @ [facility]) s/p multiple GYN surgeries Bilateral hearing impairment requiring hearing aids; 2/2 congenital Rubella; s/p stapedectomy (Dr. Dillard Cannon) TMJ Cyst R liver 6mm (CT 06/05/2005) Hx of headache in past, w/ MRI that raised ? of pseudotumor s/p therapeutic LP w/ opening pressure of 18 cm H20 (04/2005) Right elbow bone spur Epigastric pain Hx of extensive ED and OPC visits for various complaints Hx of 'probable' Herpes zoster R ear Hx of tobacco use Hx of C. diff coliits Hx of idiopathic gallactorhea Hx of Adderal, codeine, hydrocodone abuse Hx of pelvic pain Hx of bact vaginosis Hx of abnormal uterine bleeding. s/p hysterectomy 2005  Past Surgical History: Last updated: 07/01/2006 Stapedectomy, 2007 (Dr. Ezzard Standing) Partial Hysterectomy, 2003 (endometriosis) w/ residual R ovary Ovarian cystectomy Right side, 2001 Lysis of adhesions, 2001 Thyroid nodule removal, 1997(benign) Left salpingo oopherectomy (s/p ovarian torsion, L side) Caesarean section, 1994  Social History: Last updated: 09/14/2008 Current smoker Alcohol use-no Drug use-no  cats at  home lives with husband and 2 children  Risk Factors: Exercise: no (11/08/2009)  Risk Factors: Smoking Status: quit (12/07/2009) Packs/Day: 1 (12/07/2009) Passive Smoke Exposure: no (12/07/2009)  Social History: Smoking Status:  quit  Review of Systems      See HPI  Physical Exam  Additional Exam:  Gen: AOx3, in no acute distress Eyes: PERRL, EOMI ENT:MMM, No erythema noted in posterior pharynx, bilateral ear canal clear with TM intact and cone of light in lower quadrant seen. Neck: No JVD, 2x2 lymph node on right cervical region. Chest: CTAB with  good respiratory effort CVS: regular rhythmic rate, NO M/R/G, S1 S2 normal Abdo: soft,ND, BS+x4, Non tender and No hepatosplenomegaly EXT: No odema noted Neuro: Non focal, gait is normal Skin: no rashes noted.    Impression & Recommendations:  Problem # 1:  ACUTE SINUSITIS, UNSPECIFIED (ICD-461.9) Assessment New Since patinet is complaioning of this pain on the right side of her head  and she has a history of sinusitis diagnosed in MRI in the past, We would treat her with Z pack and decongestents and vicodin for pain control. Supportive management with warm compresses and hydration was discussed and she will call back if she becomes worse. Her updated medication list for this problem includes:    Azithromycin 250 Mg Tabs (Azithromycin) .Marland Kitchen... Take 2 tabs today and then 1 tba for next 4 days.  Instructed on treatment. Call if symptoms persist or worsen.   Problem # 2:  Preventive Health Care (ICD-V70.0) Assessment: Comment Only Flu shot given.  Complete Medication List: 1)  Levoxyl 25 Mcg Tabs (Levothyroxine sodium) .... Take 1 tablet by mouth once a day 2)  Prilosec Otc 20 Mg Tbec (Omeprazole magnesium) .... Take 1 tablet by mouth once a day 3)  Promethazine Hcl 25 Mg Tabs (Promethazine hcl) .Marland Kitchen.. 1 tab every 4 hrs as needed for nausea/vominting 4)  Simethicone 80 Mg Chew (Simethicone) .Marland Kitchen.. 1 tab daily 5)  Azithromycin 250 Mg  Tabs (Azithromycin) .... Take 2 tabs today and then 1 tba for next 4 days. 6)  Hydrocodone-acetaminophen 5-500 Mg Tabs (Hydrocodone-acetaminophen) .... Take 1 tab by mouth as needed for pain every 6 hours.  Patient Instructions: 1)  Please schedule a follow-up appointment as needed. 2)  Take 400-600mg  of Ibuprofen (Advil, Motrin) with food every 4-6 hours as needed for relief of pain or comfort of fever. 3)  Take your antibiotic as prescribed until ALL of it is gone, but stop if you develop a rash or swelling and contact our office as soon as possible. 4)  Acute sinusitis symptoms for less than 10 days are not helped by antibiotics.Use warm moist compresses, and over the counter decongestants ( only as directed). Call if no improvement in 5-7 days, sooner if increasing pain, fever, or new symptoms. Prescriptions: HYDROCODONE-ACETAMINOPHEN 5-500 MG TABS (HYDROCODONE-ACETAMINOPHEN) take 1 tab by mouth as needed for pain every 6 hours.  #20 x 0   Entered and Authorized by:   Lars Mage MD   Signed by:   Lars Mage MD on 12/07/2009   Method used:   Print then Give to Patient   RxID:   [id number] AZITHROMYCIN 250 MG TABS (AZITHROMYCIN) take 2 tabs today and then 1 tba for next 4 days.  #6 x 0   Entered and Authorized by:   Lars Mage MD   Signed by:   Lars Mage MD on 12/07/2009   Method used:   Print then Give to Patient   RxID:   [id number]    Prevention & Chronic Care Immunizations   Influenza vaccine: Not documented   Influenza vaccine deferral: Deferred  (11/07/2009)    Tetanus booster: Not documented   Td booster deferral: Deferred  (12/07/2009)    Pneumococcal vaccine: Not documented  Other Screening   Pap smear: Not documented   Pap smear action/deferral: Not indicated S/P hysterectomy  (06/13/2009)    Mammogram: Normal  (01/09/2002)   Mammogram action/deferral: Deferred-2 yr interval  (07/21/2009)   Smoking status: quit  (12/07/2009)  Lipids   Total  Cholesterol: 185  (06/13/2009)   Lipid panel action/deferral: Lipid Panel ordered   LDL: 117  (06/13/2009)   LDL Direct: Not documented   HDL: 37  (06/13/2009)   Triglycerides: 153  (06/13/2009)   Nursing Instructions: Give Flu vaccine today   Appended Document: flu vaccine//kg...    Nurse Visit   Allergies: 1)  ! Prednisone 2)  ! Sulfa 3)  !  Cipro 4)  ! Augmentin 5)  ! * Onions 6)  ! Ultram 7)  ! Keflex 8)  ! Doxycycline 9)  ! * Cefuroxime 10)  ! Biaxin 11)  ! * Shrimp  Orders Added: 1)  Flu Vaccine 37yrs + [90658] 2)  Administration Flu vaccine - MCR [G0008]       Flu Vaccine Consent Questions     Do you have a history of severe allergic reactions to this vaccine? no    Any prior history of allergic reactions to egg and/or gelatin? no    Do you have a sensitivity to the preservative Thimersol? no    Do you have a past history of Guillan-Barre Syndrome? no    Do you currently have an acute febrile illness? no    Have you ever had a severe reaction to latex? no    Vaccine information given and explained to patient? yes    Are you currently pregnant? no    Lot Number: [id number]   Exp Date:04/31/2011   Manufacturer: [employer]    Site Given  Left Deltoid IM.Krystal Eaton [facility])  December 07, 2009 4:36 PM

## 2010-10-17 NOTE — Progress Notes (Signed)
Summary: phone/gg  Phone Note Call from Patient   Caller: Patient Summary of Call: Pt called with c/o ? UTI  Seen in ED last THursday with back pain.  She had UTI and treated with antibiotic.  She got better but today back and legs hurting.  Urine is cloudy.  ? fever, feels horrible, just laying in bed.   Does not feel like her usual UTI's  With these symptoms I advised visit to ED/[facility] tonight for eval.    Patient/caller verbalizes understanding of these instructions.  Initial call taken by: Merrie Roof RN,  May 25, 2010 5:20 PM  Follow-up for Phone Call        Agree. THanks Follow-up by: Blanch Media MD,  May 25, 2010 5:36 PM

## 2010-10-17 NOTE — Assessment & Plan Note (Signed)
Summary: f/u, "i'm worse", "i'm nauseated"/pcp-Elias Bordner/hla   Vital Signs:  Patient profile:   40 year old female Height:      60 inches Weight:      161.5 pounds BMI:     31.65 O2 Sat:      99 % on Room air Temp:     98.9 degrees F oral Pulse rate:   74 / minute BP sitting:   106 / 71  (right arm)  Vitals Entered By: Filomena Jungling NT II (November 08, 2009 3:25 PM)  O2 Flow:  Room air CC: FOLLOW-UP visit , NO BETTER REALLY FEELS BLOATED, STILL NAUSEATED Nutritional Status BMI of > 30 = obese  Have you ever been in a relationship where you felt threatened, hurt or afraid?No   Does patient need assistance? Functional Status Self care Ambulation Normal   Primary Care Provider:  Brooks Sailors MD  CC:  FOLLOW-UP visit , NO BETTER REALLY FEELS BLOATED, and STILL NAUSEATED.  History of Present Illness: 40 y/o woman seen by me yesterday for SOB and feeling of swelling in hands and feet comes today complaining for n/v and stomach ache Sarted last night, Nauseas all the time. Has been vomiting whatever she tried to eat. No blood. Has constant abd pain mainly on  the RUQ. Also, feels bloated. No diarrhea or consitpation. No fevers, chills. Has not tried any medication for nausea or vomiting  Preventive Screening-Counseling & Management  Alcohol-Tobacco     Smoking Status: quit < 6 months     Smoking Cessation Counseling: yes     Packs/Day: 1     Year Started: NINE YEARS AGO     Year Quit: 2010     Passive Smoke Exposure: no  Caffeine-Diet-Exercise     Does Patient Exercise: no     Type of exercise: WALKING     Times/week: 3  Current Medications (verified): 1)  Levoxyl 25 Mcg Tabs (Levothyroxine Sodium) .... Take 1 Tablet By Mouth Once A Day 2)  Prilosec Otc 20 Mg Tbec (Omeprazole Magnesium) .... Take 1 Tablet By Mouth Once A Day 3)  Promethazine Hcl 25 Mg Tabs (Promethazine Hcl) .Marland Kitchen.. 1 Tab Every 4 Hrs As Needed For Nausea/vominting 4)  Simethicone 80 Mg Chew (Simethicone) .Marland Kitchen.. 1  Tab Daily  Allergies (verified): 1)  ! Codeine 2)  ! Prednisone 3)  ! Sulfa 4)  ! Cipro 5)  ! Augmentin 6)  ! * Onions 7)  ! Ultram 8)  ! Keflex 9)  ! Doxycycline 10)  ! * Cefuroxime 11)  ! Biaxin 12)  ! * Shrimp  Review of Systems       The patient complains of anorexia, abdominal pain, and severe indigestion/heartburn.  The patient denies fever, weight loss, weight gain, vision loss, decreased hearing, hoarseness, chest pain, syncope, dyspnea on exertion, peripheral edema, prolonged cough, headaches, hemoptysis, melena, hematochezia, hematuria, incontinence, genital sores, muscle weakness, suspicious skin lesions, transient blindness, difficulty walking, depression, unusual weight change, abnormal bleeding, enlarged lymph nodes, angioedema, breast masses, and testicular masses.    Physical Exam  General:  alert, well-developed, well-nourished, and well-hydrated.   Head:  normocephalic and atraumatic.   Eyes:  vision grossly intact.   Ears:  R ear normal and L ear normal.   Nose:  no external deformity.   Mouth:  good dentition.   Neck:  supple, full ROM, and no masses.   Lungs:  normal respiratory effort, no intercostal retractions, no accessory muscle use, normal breath sounds, no  dullness, and no wheezes.   Heart:  normal rate, regular rhythm, no murmur, and no JVD.   Abdomen:  soft, distended and tender on right side diffusely, no rigidity or gauriding, and normal bowel sounds.   Msk:  normal ROM, no joint tenderness, no joint swelling, no joint warmth, no redness over joints, no joint deformities, and no joint instability.   Pulses:  dorsalis pedis pulses normal bilaterally  Extremities:  no edema Neurologic:  OrientedX3, cranial nerver 2-12 intact,strength good in all extremities, sensations normal to light touch, reflexes 2+ b/l, gait normal    Impression & Recommendations:  Problem # 1:  ABDOMINAL PAIN, GENERALIZED (ICD-789.07) Will give promethazine and simethicone  for symtomatic management. Bloating seems to be from IBS. Maybe some viral gastritis also. Will treat conservatively. Advised adequate hydration and bowel rest. Her updated medication list for this problem includes:    Simethicone 80 Mg Chew (Simethicone) .Marland Kitchen... 1 tab daily  Discussed symptom control with the patient.   Problem # 2:  SHORTNESS OF BREATH (ICD-786.05) Resolved. Also, the swellingin hands and legs resolved  Complete Medication List: 1)  Levoxyl 25 Mcg Tabs (Levothyroxine sodium) .... Take 1 tablet by mouth once a day 2)  Prilosec Otc 20 Mg Tbec (Omeprazole magnesium) .... Take 1 tablet by mouth once a day 3)  Promethazine Hcl 25 Mg Tabs (Promethazine hcl) .Marland Kitchen.. 1 tab every 4 hrs as needed for nausea/vominting 4)  Simethicone 80 Mg Chew (Simethicone) .Marland Kitchen.. 1 tab daily  Patient Instructions: 1)  Please schedule a follow-up appointment as needed. Prescriptions: PROMETHAZINE HCL 25 MG TABS (PROMETHAZINE HCL) 1 tab every 4 hrs as needed for nausea/vominting  #60 x 0   Entered and Authorized by:   Bethel Born MD   Signed by:   Bethel Born MD on 11/08/2009   Method used:   Print then Give to Patient   RxID:   [id number]   Prevention & Chronic Care Immunizations   Influenza vaccine: Not documented   Influenza vaccine deferral: Deferred  (11/07/2009)    Tetanus booster: Not documented   Td booster deferral: Deferred  (11/07/2009)    Pneumococcal vaccine: Not documented  Other Screening   Pap smear: Not documented   Pap smear action/deferral: Not indicated S/P hysterectomy  (06/13/2009)    Mammogram: Normal  (01/09/2002)   Mammogram action/deferral: Deferred-2 yr interval  (07/21/2009)   Smoking status: quit < 6 months  (11/08/2009)  Lipids   Total Cholesterol: 185  (06/13/2009)   Lipid panel action/deferral: Lipid Panel ordered   LDL: 117  (06/13/2009)   LDL Direct: Not documented   HDL: 37  (06/13/2009)   Triglycerides: 153  (06/13/2009)

## 2010-10-17 NOTE — Assessment & Plan Note (Signed)
Summary: f/u UTI/gg   Vital Signs:  Patient profile:   40 year old female Height:      60 inches Weight:      171.8 pounds BMI:     33.67 Temp:     99.0 degrees F Pulse rate:   78 / minute BP sitting:   125 / 74  (right arm)  Vitals Entered By: Filomena Jungling NT II (June 01, 2010 2:19 PM) CC: NEED REFILLS/ ER FOLLOW-UP X 2 FOR UTI Is Patient Diabetic? No Pain Assessment Patient in pain? no      Nutritional Status BMI of > 30 = obese  Have you ever been in a relationship where you felt threatened, hurt or afraid?No   Does patient need assistance? Functional Status Self care Ambulation Normal   Primary Care Provider:  Brooks Sailors MD  CC:  NEED REFILLS/ ER FOLLOW-UP X 2 FOR UTI.  History of Present Illness: [name] is a 40 year old woman with pmh significant for Hypothyroidism, bilateral hearing loss, depression, bipolar disorder and chronic pain in multiple sites who was recently seen in the ED in Aug and early Sept for UTI. Culture showed greater than 100 000 colonies of multiple bacteria (no sensitivity), and was given a prescription for macrobid for 14 days.   Pt was given prescription 09/08, pt is on day 6 of 14. Pt still complains of lower abdominal pain, but states the burning has improved. Denies N/V, changes in bowel habits.   Pt also complains of having skin bites on her legs, feet, and arms. She does not know where or how she is getting bit. She complains that the bites are itchy. She denies bed bugs, [place] area, and no other family members have similar bites.          Preventive Screening-Counseling & Management  Alcohol-Tobacco     Smoking Status: quit     Smoking Cessation Counseling: yes     Packs/Day: 1     Year Started: NINE YEARS AGO     Year Quit: 2010     Passive Smoke Exposure: no  Caffeine-Diet-Exercise     Does Patient Exercise: no     Type of exercise: WALKING     Times/week: 3  Current Medications  (verified): 1)  Levoxyl 25 Mcg Tabs (Levothyroxine Sodium) .... Take 1 Tablet By Mouth Once A Day 2)  Hydrocodone-Acetaminophen 5-500 Mg Tabs (Hydrocodone-Acetaminophen) .... Take 1 Tab By Mouth As Needed For Pain Every 6 Hours. 3)  Estrace 2 Mg Tabs (Estradiol) .... Take 1 Tablet By Mouth Once A Day 4)  Macrobid 100 Mg Caps (Nitrofurantoin Monohyd Macro) .... Take 1 Tablet By Mouth Two Times A Day  Allergies (verified): 1)  ! Prednisone 2)  ! Sulfa 3)  ! Cipro 4)  ! Augmentin 5)  ! * Onions 6)  ! Ultram 7)  ! Keflex 8)  ! Doxycycline 9)  ! * Cefuroxime 10)  ! Biaxin 11)  ! * Shrimp  Past History:  Past Medical History: Last updated: 12/03/2007 GERD ? bipolar disease Hypothyroidism Irritable bowel syndrome Chronic functional constipation Anxiety Chronic right sided pelvic pain, dyspareunia (followed by Dr. Okey Dupre @ [facility]) s/p multiple GYN surgeries Bilateral hearing impairment requiring hearing aids; 2/2 congenital Rubella; s/p stapedectomy (Dr. Dillard Cannon) TMJ Cyst R liver 6mm (CT 06/05/2005) Hx of headache in past, w/ MRI that raised ? of pseudotumor s/p therapeutic LP w/ opening pressure of 18 cm H20 (04/2005) Right elbow  bone spur Epigastric pain Hx of extensive ED and OPC visits for various complaints Hx of 'probable' Herpes zoster R ear Hx of tobacco use Hx of C. diff coliits Hx of idiopathic gallactorhea Hx of Adderal, codeine, hydrocodone abuse Hx of pelvic pain Hx of bact vaginosis Hx of abnormal uterine bleeding. s/p hysterectomy 2005  Past Surgical History: Last updated: 07/01/2006 Stapedectomy, 2007 (Dr. Ezzard Standing) Partial Hysterectomy, 2003 (endometriosis) w/ residual R ovary Ovarian cystectomy Right side, 2001 Lysis of adhesions, 2001 Thyroid nodule removal, 1997(benign) Left salpingo oopherectomy (s/p ovarian torsion, L side) Caesarean section, 1994  Social History: Last updated: 09/14/2008 Current smoker Alcohol use-no Drug use-no  cats at  home lives with husband and 2 children  Risk Factors: Exercise: no (06/01/2010)  Risk Factors: Smoking Status: quit (06/01/2010) Packs/Day: 1 (06/01/2010) Passive Smoke Exposure: no (06/01/2010)  Review of Systems      See HPI  Physical Exam  General:  alert and well-developed.   Head:  normocephalic and atraumatic.   Neck:  supple.   Lungs:  normal respiratory effort and normal breath sounds.   Heart:  normal rate and regular rhythm.   Abdomen:  soft and non-tender.   Msk:  normal ROM, no joint tenderness, no joint swelling, no joint warmth, and no redness over joints.   Extremities:  no edema  Skin:  small bites located on arms and legs. they appear to be similar to mosquito bites, some erythema, no warmth or swelling appreciated    Impression & Recommendations:  Problem # 1:  UTI (ICD-599.0) Pt is on day 6 of 14 of macrobid therapy, however her symptoms of lower abdominal pain persist. Urine culture from 09/08 show greater than 100, 000 colonies of multiple bacterial morphotypes present, none predominant. Will recollect urine specimen and send it for UA and UC. Continue macrobid until 14 day course is finished. Pyridium for symptomatic relief of bladder spasms.   The following medications were removed from the medication list:    Azithromycin 250 Mg Tabs (Azithromycin) .Marland Kitchen... Take 2 tabs today and then 1 tba for next 4 days. Her updated medication list for this problem includes:    Macrobid 100 Mg Caps (Nitrofurantoin monohyd macro) .Marland Kitchen... Take 1 tablet by mouth two times a day  Orders: T-Culture, Urine ([id number]) T-Urinalysis ([id number])  Problem # 2:  SKIN RASH (ICD-782.1) Etiology unknown. Appears like bug bites. Will treat symptomatically with hydrocortisone cream.   Her updated medication list for this problem includes:    Hydrocortisone 1 % Cream (Hydrocortisone (topical)) .Marland Kitchen... Apply bid  Complete Medication List: 1)  Levoxyl 25 Mcg Tabs (Levothyroxine  sodium) .... Take 1 tablet by mouth once a day 2)  Hydrocodone-acetaminophen 5-500 Mg Tabs (Hydrocodone-acetaminophen) .... Take 1 tab by mouth as needed for pain every 6 hours. 3)  Estrace 2 Mg Tabs (Estradiol) .... Take 1 tablet by mouth once a day 4)  Macrobid 100 Mg Caps (Nitrofurantoin monohyd macro) .... Take 1 tablet by mouth two times a day 5)  Phenazopyridine Hcl 200 Mg Tabs (Phenazopyridine hcl) .... Take one tab twice daily. 6)  Hydrocortisone 1 % Cream (Hydrocortisone (topical)) .... Apply bid  Patient Instructions: 1)  Follow up as needed.  2)  Please finish entire antibiotic course. 3)  Please take Pyridium as directed. 4)  Please apply hydrocortisone cream as directed. Prescriptions: HYDROCORTISONE 1 % CREAM (HYDROCORTISONE (TOPICAL)) apply bid  #1 tube x 0   Entered and Authorized by:   Melida Quitter MD   Signed by:  Melida Quitter MD on 06/01/2010   Method used:   [facility]  [address] [id number]       [address]       [place], [place]  [address]       Ph: [phone]       Fax: [phone]   RxID:   [id number] PHENAZOPYRIDINE HCL 200 MG TABS (PHENAZOPYRIDINE HCL) Take one tab twice daily.  #10 x 0   Entered and Authorized by:   Melida Quitter MD   Signed by:   Melida Quitter MD on 06/01/2010   Method used:   [facility]        [facility]  [address] [phone]* (retail)       [address]       [place], [place]  [address]       Ph: [phone]       Fax: [phone]   RxID:   [id number]   Prevention & Chronic Care Immunizations   Influenza vaccine: Fluvax 3+  (12/07/2009)   Influenza vaccine deferral: Refused  (06/01/2010)    Tetanus booster: Not documented   Td booster deferral: Deferred  (12/07/2009)    Pneumococcal vaccine: Not documented  Other Screening   Pap smear: Not documented   Pap smear action/deferral: Not indicated S/P hysterectomy  (06/13/2009)     Mammogram: Normal  (01/09/2002)   Mammogram action/deferral: Deferred to age 42  (06/01/2010)   Smoking status: quit  (06/01/2010)  Lipids   Total Cholesterol: 185  (06/13/2009)   Lipid panel action/deferral: Lipid Panel ordered   LDL: 117  (06/13/2009)   LDL Direct: Not documented   HDL: 37  (06/13/2009)   Triglycerides: 153  (06/13/2009)  Process Orders Check Orders Results:     [facility]: ABN not required for this insurance Tests Sent for requisitioning (June 01, 2010 4:06 PM):     06/01/2010: [facility] -- T-Culture, Urine [[id number]] (signed)     06/01/2010: [facility] -- T-Urinalysis [[id number]] (signed)     Laboratory Results   Urine Tests  Date/Time Received: 06/01/2010 Date/Time Reported: 2;30  Routine Urinalysis   Color: yellow Appearance: Clear Glucose: negative   (Normal Range: Negative) Bilirubin: negative   (Normal Range: Negative) Ketone: negative   (Normal Range: Negative) Spec. Gravity: <1.005   (Normal Range: 1.003-1.035) Blood: negative   (Normal Range: Negative) pH: 6.5   (Normal Range: 5.0-8.0) Protein: negative   (Normal Range: Negative) Urobilinogen: 0.2   (Normal Range: 0-1) Nitrite: negative   (Normal Range: Negative) Leukocyte Esterace: trace   (Normal Range: Negative)

## 2010-10-17 NOTE — Assessment & Plan Note (Signed)
Summary: per Ernesta Amble, eye & hands swollen [mkj]   Vital Signs:  Patient profile:   40 year old female Height:      60 inches Weight:      163.3 pounds BMI:     32.01 O2 Sat:      99 % on Room air Temp:     99.1 degrees F oral Pulse rate:   80 / minute BP sitting:   110 / 71  (right arm)  Vitals Entered By: Filomena Jungling NT II (November 07, 2009 9:52 AM)  O2 Flow:  Room air CC: TROUBLE BREATHING, AND SWELLING ALL OVER FOR 3 MONTHS, WEIGHT GAINING, Depression Is Patient Diabetic? No Pain Assessment Patient in pain? no      Nutritional Status BMI of > 30 = obese  Have you ever been in a relationship where you felt threatened, hurt or afraid?No   Does patient need assistance? Functional Status Self care Ambulation Normal   Primary Care Provider:  Brooks Sailors MD  CC:  TROUBLE BREATHING, AND SWELLING ALL OVER FOR 3 MONTHS, WEIGHT GAINING, and Depression.  History of Present Illness: 40 y/o woman with PMH of IBS and hypothyroidism comes to the clinic complaining of feeling "lousy". She is feeling short of breath, swollen in hands and feet and having epigastric pain since last 3 months.  she has not seen any doctor until now for these complaints. She has not tried any new medications recently. Her complains has been gradually getting worse. No nausea, vomiting, chest pain, acute shortness of breath or exercise intolerence.   Depression History:      The patient denies a depressed mood most of the day and a diminished interest in her usual daily activities.         Preventive Screening-Counseling & Management  Alcohol-Tobacco     Smoking Status: quit < 6 months     Smoking Cessation Counseling: yes     Packs/Day: 1     Year Started: NINE YEARS AGO     Year Quit: 2010     Passive Smoke Exposure: no  Caffeine-Diet-Exercise     Does Patient Exercise: no     Type of exercise: WALKING     Times/week: 3  Current Medications (verified): 1)  Levoxyl 25 Mcg Tabs  (Levothyroxine Sodium) .... Take 1 Tablet By Mouth Once A Day 2)  Prilosec Otc 20 Mg Tbec (Omeprazole Magnesium) .... Take 1 Tablet By Mouth Once A Day  Allergies (verified): 1)  ! Codeine 2)  ! Prednisone 3)  ! Sulfa 4)  ! Cipro 5)  ! Augmentin 6)  ! * Onions 7)  ! Ultram 8)  ! Keflex 9)  ! Doxycycline 10)  ! * Cefuroxime 11)  ! Biaxin 12)  ! * Shrimp  Review of Systems       The patient complains of weight gain and decreased hearing.  The patient denies anorexia, fever, weight loss, vision loss, hoarseness, chest pain, syncope, dyspnea on exertion, peripheral edema, prolonged cough, headaches, hemoptysis, abdominal pain, melena, hematochezia, severe indigestion/heartburn, hematuria, incontinence, genital sores, muscle weakness, suspicious skin lesions, transient blindness, difficulty walking, depression, unusual weight change, abnormal bleeding, enlarged lymph nodes, angioedema, breast masses, and testicular masses.    Physical Exam  General:  alert and well-developed.   Head:  normocephalic and atraumatic.   Eyes:  vision grossly intact, pupils round, and pupils reactive to light.   Ears:  R ear normal and L ear normal.   Neck:  supple, full ROM, no masses, no thyromegaly, and no JVD.   Lungs:  normal respiratory effort, no intercostal retractions, no accessory muscle use, normal breath sounds, no dullness, and no wheezes.   Heart:  normal rate, regular rhythm, no murmur, and no JVD.   Abdomen:  soft, non-tender, and normal bowel sounds.   Pulses:  dorsalis pedis pulses normal bilaterally  Extremities:  no edema Neurologic:  OrientedX3, cranial nerver 2-12 intact,strength good in all extremities, sensations normal to light touch, reflexes 2+ b/l, gait normal    Impression & Recommendations:  Problem # 1:  SHORTNESS OF BREATH (ICD-786.05) Her complaints are very non specific. Looks like an anxiety episode but patient denies that any of her complaints are psychological. Will  check TSH. Last one was in 9/10 which was normal. She is complaint with her medication. For her abdominal pain, will ask her to take protonix regularly daily. Will check HbA1C as she complaints of polyurea and polydipsia plus FH of DM. She is not fasting today. Other than that, do not think she needs anything else for he rcomplaints. She does not look satisfied with this plan and may return soon with similar set of complaints. Will get an Chest XRA for her satisfaction at that time. Will re-emphasize anxiety attack as the cause of her symptoms.   Problem # 2:  DEPRESSION (ICD-311) Not on any medication. No symtomsof depression.   Problem # 3:  POLYURIA (EAV-409.81) Will check HbA1C for DM rule out  Problem # 4:  HYPOTHYROIDISM (ICD-244.9)  Will check TSH given set of symtoms. Complaint with meds  Her updated medication list for this problem includes:    Levoxyl 25 Mcg Tabs (Levothyroxine sodium) .Marland Kitchen... Take 1 tablet by mouth once a day  Labs Reviewed: TSH: 2.019 (06/16/2009)     Chol: 185 (06/13/2009)   HDL: 37 (06/13/2009)   LDL: 117 (06/13/2009)   TG: 153 (06/13/2009)  Orders: T-TSH ([id number])  Complete Medication List: 1)  Levoxyl 25 Mcg Tabs (Levothyroxine sodium) .... Take 1 tablet by mouth once a day 2)  Prilosec Otc 20 Mg Tbec (Omeprazole magnesium) .... Take 1 tablet by mouth once a day  Other Orders: T-Hgb A1C (in-house) ([id number])  Patient Instructions: 1)  Please schedule a follow-up appointment in 3 months. 2)  Your lungs are fine. If you feel you are choking or acutely short of breath, come the ED or to the clinic 3)  It is important that you exercise regularly at least 20 minutes 5 times a week. If you develop chest pain, have severe difficulty breathing, or feel very tired , stop exercising immediately and seek medical attention. 4)  You need to lose weight. Consider a lower calorie diet and regular exercise.  5)  Congratualtions for quitting smoking!!  Prevention  & Chronic Care Immunizations   Influenza vaccine: Not documented   Influenza vaccine deferral: Deferred  (11/07/2009)    Tetanus booster: Not documented   Td booster deferral: Deferred  (11/07/2009)    Pneumococcal vaccine: Not documented  Other Screening   Pap smear: Not documented   Pap smear action/deferral: Not indicated S/P hysterectomy  (06/13/2009)    Mammogram: Normal  (01/09/2002)   Mammogram action/deferral: Deferred-2 yr interval  (07/21/2009)   Smoking status: quit < 6 months  (11/07/2009)  Lipids   Total Cholesterol: 185  (06/13/2009)   Lipid panel action/deferral: Lipid Panel ordered   LDL: 117  (06/13/2009)   LDL Direct: Not documented   HDL: 37  (06/13/2009)  Triglycerides: 153  (06/13/2009)    Process Orders Check Orders Results:     [facility]: ABN not required for this insurance Tests Sent for requisitioning (November 07, 2009 12:06 PM):     11/07/2009: [facility] -- T-TSH [phone] (signed)    Laboratory Results   Blood Tests   Date/Time Received: November 07, 2009 10:56 AM Date/Time Reported: Alric Quan  November 07, 2009 10:56 AM   HGBA1C: 5.1%   (Normal Range: Non-Diabetic - 3-6%   Control Diabetic - 6-8%)

## 2010-10-17 NOTE — Assessment & Plan Note (Signed)
Summary: R leg swollen, painful all over 8/10 pain scale x 2 days/pcp-...   Vital Signs:  Patient profile:   40 year old female Height:      60 inches (152.40 cm) Weight:      175.3 pounds (79.68 kg) BMI:     34.36 Temp:     97.9 degrees F (36.61 degrees C) oral Pulse rate:   84 / minute BP sitting:   117 / 77  (left arm)  Vitals Entered By: Stanton Kidney Ditzler RN (August 23, 2010 1:55 PM) Is Patient Diabetic? No Pain Assessment Patient in pain? yes     Location: right leg Intensity: 8 Type: hurts Onset of pain  past 2 days Nutritional Status BMI of > 30 = obese Nutritional Status Detail appetite fair  Have you ever been in a relationship where you felt threatened, hurt or afraid?denies   Does patient need assistance? Functional Status Self care Ambulation Normal Comments Hit right ankle on 08/19/10 - past 2 days right leg hurts. Inc wt gain past 3-4 months.   Primary Care Provider:  Brooks Sailors MD   History of Present Illness: 40 yo woman with PMH GERD, hypothyroidism, endometriosis, bipolar disorder presents today for right leg pain  and swelling x since yesterday.  She thought she stood on her feet too long.  She describes it as throbbing, constant.  She tried ice and about 3 ibuprofen 800mg  per day without relief.  A post of the woodrail fell and hit her leg/ankle on 08/19/10 but she did not feel any pain then.  She went about her business until yesterday when it started to hurt.  She denies any numbness or tingling, fever. She has been gaining weight about 10 pounds in the past several months and she has been taking her medications religiously, and she wants her thyroid to be checked today.   Depression History:      The patient denies a depressed mood most of the day and a diminished interest in her usual daily activities.         Preventive Screening-Counseling & Management  Alcohol-Tobacco     Smoking Status: quit     Smoking Cessation Counseling: yes  Packs/Day: 1     Year Started: NINE YEARS AGO     Year Quit: 2010     Passive Smoke Exposure: no  Caffeine-Diet-Exercise     Does Patient Exercise: no     Type of exercise: WALKING     Times/week: 3  Allergies: 1)  ! Prednisone 2)  ! Sulfa 3)  ! Cipro 4)  ! Augmentin 5)  ! * Onions 6)  ! Ultram 7)  ! Keflex 8)  ! Doxycycline 9)  ! * Cefuroxime 10)  ! Biaxin 11)  ! * Shrimp  Review of Systems       The patient complains of weight gain.    Physical Exam  General:  alert, well-developed, well-nourished, and well-hydrated.   Lungs:  normal respiratory effort, no intercostal retractions, no accessory muscle use, normal breath sounds, no crackles, and no wheezes.   Heart:  normal rate, regular rhythm, no murmur, no gallop, no rub, and no JVD.   Abdomen:  soft, non-tender, normal bowel sounds, no distention, no masses, and no rebound tenderness.   Msk:  R leg- mild edema on thigh and knee joint, warm to touch, tenderness to palpation.  Neg straight leg raising test. Mild erythema on right knee. Pulses:  R and L carotid,radial,femoral,dorsalis pedis and  posterior tibial pulses are full and equal bilaterally Neurologic:  alert & oriented X3, cranial nerves II-XII intact, strength normal in all extremities, and sensation intact to light touch.     Impression & Recommendations:  Problem # 1:  KNEE PAIN, RIGHT, ACUTE (ICD-719.46) Most likely prepatellar bursitis because she has pain on anterior thigh/knee with swelling and warm to touch.  Her history is consistent with trauma caused prepatellar bursitis because she is on her feet all day at work in addition to the injury to her ankle on 08/19/10.  Although infection and crystal deposits can cause bursitis as well; however, this is unlikely since patient is afebrile.   Will treat with NSAIDs- Naproxen 500mg  1 tablet by mouth two times a day x 14 days Continue trying ice/heating pads to affected area Rest and avoid repetitive trauma    Will give Vicodin for pain relief because she has not been able to sleep in the past 2 nights secondary to pain  The following medications were removed from the medication list:    Hydrocodone-acetaminophen 5-500 Mg Tabs (Hydrocodone-acetaminophen) .Marland Kitchen... Take 1 tab by mouth as needed for pain every 6 hours. Her updated medication list for this problem includes:    Naproxen 500 Mg Tabs (Naproxen) .Marland Kitchen... 1 tablet by mouth two times a day x 1 days    Vicodin 5-500 Mg Tabs (Hydrocodone-acetaminophen) .Marland Kitchen... 1 tablet by mouth every 4-6 hours as needed for pain  The following medications were removed from the medication list:    Hydrocodone-acetaminophen 5-500 Mg Tabs (Hydrocodone-acetaminophen) .Marland Kitchen... Take 1 tab by mouth as needed for pain every 6 hours. Her updated medication list for this problem includes:    Naproxen 500 Mg Tabs (Naproxen) .Marland Kitchen... 1 tablet by mouth two times a day x 1 days    Vicodin 5-500 Mg Tabs (Hydrocodone-acetaminophen) .Marland Kitchen... 1 tablet by mouth every 4-6 hours as needed for pain  Problem # 2:  HYPOTHYROIDISM (ICD-244.9) Last  TSH 2.225 in3/2011.  Patient reports 10 lbs weight gain.  Will check her TSH level today and may need to readjust her Levoxyl dosage.   Her updated medication list for this problem includes:    Levoxyl 25 Mcg Tabs (Levothyroxine sodium) .Marland Kitchen... Take 1 tablet by mouth once a day  Orders: T-TSH ([id number])  Complete Medication List: 1)  Levoxyl 25 Mcg Tabs (Levothyroxine sodium) .... Take 1 tablet by mouth once a day 2)  Estrace 2 Mg Tabs (Estradiol) .... Take 1 tablet by mouth once a day 3)  Naproxen 500 Mg Tabs (Naproxen) .Marland Kitchen.. 1 tablet by mouth two times a day x 1 days 4)  Vicodin 5-500 Mg Tabs (Hydrocodone-acetaminophen) .Marland Kitchen.. 1 tablet by mouth every 4-6 hours as needed for pain  Patient Instructions: 1)  Take Naproxen 500mg  1 tablet by mouth two times a day x 14 2)  Take Vicodin 5/500mg  1 tablet by mouth every 4-6 hrs as needed for pain 3)  Rest  and avoid repetitive trauma to knee 4)  Will  check your thyroid function today and I will call you with results 5)  Please follow up with your PCP in 2 weeks Prescriptions: VICODIN 5-500 MG TABS (HYDROCODONE-ACETAMINOPHEN) 1 tablet by mouth every 4-6 hours as needed for pain  #14 x 0   Entered and Authorized by:   Rosana Berger MD   Signed by:   Rosana Berger MD on 08/23/2010   Method used:   Handwritten   RxID:   [id number] NAPROXEN 500 MG TABS (NAPROXEN) 1  tablet by mouth two times a day x 1 days  #30 x 0   Entered and Authorized by:   Rosana Berger MD   Signed by:   Rosana Berger MD on 08/23/2010   Method used:   [facility] to        [facility]  [address] [phone]* (retail)       [address]       [place], [place]  [address]       Ph: [phone]       Fax: [phone]   RxID:   [id number]    Orders Added: 1)  T-TSH [phone] 2)  Est. Patient Level III [[address]]   Process Orders Check Orders Results:     [facility]: ABN not required for this insurance Tests Sent for requisitioning (August 23, 2010 4:34 PM):     08/23/2010: [facility] -- T-TSH [phone] (signed)     Prevention & Chronic Care Immunizations   Influenza vaccine: Fluvax 3+  (12/07/2009)   Influenza vaccine deferral: Refused  (06/01/2010)    Tetanus booster: Not documented   Td booster deferral: Deferred  (12/07/2009)    Pneumococcal vaccine: Not documented  Other Screening   Pap smear: Not documented   Pap smear action/deferral: Not indicated S/P hysterectomy  (06/13/2009)    Mammogram: Normal  (01/09/2002)   Mammogram action/deferral: Deferred to age 48  (06/01/2010)   Smoking status: quit  (08/23/2010)  Lipids   Total Cholesterol: 185  (06/13/2009)   Lipid panel action/deferral: Lipid Panel ordered   LDL: 117  (06/13/2009)   LDL Direct: Not documented   HDL: 37  (06/13/2009)   Triglycerides: 153  (06/13/2009)       Resource handout printed.  Appended Document: R leg swollen, painful all over 8/10 pain scale x 2 days/pcp-... I discussed the patient with Dr. Anselm Jungling and I agree with the assessment and plan as outlined above.

## 2010-10-17 NOTE — Progress Notes (Signed)
Summary: appt/ hla  Phone Note Call from Patient   Summary of Call: pt calls states she is worse than 2/21 visit, "i'm nauseated"..."this has nothing to do with anxiety and depression"..."i need to find out what is wrong with me". appt is given for this pm Initial call taken by: Marin Roberts RN,  November 10, 2009 5:32 PM

## 2010-10-19 NOTE — Assessment & Plan Note (Signed)
Summary: FU/SB.   Vital Signs:  Patient profile:   40 year old female Height:      60 inches (152.40 cm) Weight:      174.9 pounds (79.50 kg) BMI:     34.28 Temp:     98.3 degrees F (36.83 degrees C) oral Pulse rate:   76 / minute BP sitting:   119 / 78  (right arm) Cuff size:   regular  Vitals Entered By: Cynda Familia Duncan Dull) (September 27, 2010 1:23 PM) CC: ER f/u, difficulty breathing, swelling in hands, feels like she's "retaining fluid", recurrent pain with urination Is Patient Diabetic? No Pain Assessment Patient in pain? yes     Location: right side  Intensity: 7 Type: sharp Onset of pain  started last night Nutritional Status BMI of > 30 = obese  Have you ever been in a relationship where you felt threatened, hurt or afraid?No   Does patient need assistance? Functional Status Self care Ambulation Normal   Primary Care Provider:  Bethel Born MD  CC:  ER f/u, difficulty breathing, swelling in hands, feels like she's "retaining fluid", and recurrent pain with urination.  History of Present Illness: 40 yo woman with PMH GERD, hypothyroidism, endometriosis, bipolar disorder  has multiple problems today, including SOB, feeling swollen, fatigue, dry mouth this has been going on for a while although its worse in last 2 weeks. She went to the Ed 10 days ago, was diagnosed with flu and symptomatically treated. she has been feeling a little better but still has some mild sorethness in her throat and fatigue. she says she had severe shortness of  breath yesterday and has some difficulty breathing today but she is able to speak comfortably.   she is also having whitish vaginal discharge and has burning on urniation.     Depression History:      The patient denies a depressed mood most of the day and a diminished interest in her usual daily activities.         Preventive Screening-Counseling & Management  Alcohol-Tobacco     Smoking Status: quit     Smoking  Cessation Counseling: yes     Packs/Day: 1     Year Started: NINE YEARS AGO     Year Quit: 2010     Passive Smoke Exposure: no  Current Medications (verified): 1)  Levoxyl 25 Mcg Tabs (Levothyroxine Sodium) .... Take 1 Tablet By Mouth Once A Day 2)  Estrace 2 Mg Tabs (Estradiol) .... Take 1 Tablet By Mouth Once A Day  Allergies (verified): 1)  ! Prednisone 2)  ! Sulfa 3)  ! Cipro 4)  ! Augmentin 5)  ! * Onions 6)  ! Ultram 7)  ! Keflex 8)  ! Doxycycline 9)  ! * Cefuroxime 10)  ! Biaxin 11)  ! * Shrimp  Review of Systems       The patient complains of weight gain.  The patient denies anorexia, fever, weight loss, vision loss, decreased hearing, hoarseness, chest pain, syncope, dyspnea on exertion, peripheral edema, prolonged cough, headaches, hemoptysis, abdominal pain, melena, hematochezia, severe indigestion/heartburn, hematuria, incontinence, genital sores, muscle weakness, suspicious skin lesions, transient blindness, difficulty walking, depression, unusual weight change, abnormal bleeding, enlarged lymph nodes, angioedema, breast masses, and testicular masses.    Physical Exam  General:  Gen: VS reveiwed, Alert, well developed, nodistress ENT: mucous membranes pink & moist. No abnormal finds in ear and nose. CVC:S1 S2 , no murmurs, no abnormal heart sounds. Lungs: Clear  to auscultation B/L. No wheezes, crackles or other abnormal sounds Abdomen: soft, non distended, no tender. Normal Bowel sounds EXT: no pitting edema, no engorged veins, Pulsations normal  Neuro:alert, oriented *3, cranial nerved 2-12 intact, strenght normal in all  extremities, senstations normal to light touch.    Genitalia:  normal introitus, no external lesions, mucosa pink and moist, no vaginal or cervical lesions, no friaility or hemorrhage, and whitish foul smelling vaginal discharge.     Impression & Recommendations:  Problem # 1:  VAGINAL DISCHARGE (ICD-623.5) whitish foul smelling discharge  without signs of PID will check wet prep and Gd/chlamydia had hysterectomy for endometriosis  Orders: T-Wet Prep by Molecular Probe [phone]) T-Chlamydia & GC Probe, Genital ([id number])  Discussed medication use and symptom relief.   Problem # 2:  FATIGUE (ICD-780.79) she has a constillation of symoptoms that do not point at one organ system she has fatigue, dry mouth, SOB, feeling of peripheral edema although very minimal edema noted on physical exam her TSH has been normal and has been checked multiple times in the past for these complaints, so that is not the cause will perform PFTs with methacholine challenge test if her SOB persisits.   - check for rheumatological condition - BNP for CHF - CMET for albumin, liver and kidneys - had CXR, CT angio and labs in the ED last week and was normal so her percieved SOB is not related to lungs pathology - if these labs do not show anything, will consider psychiatric conditions.   Orders: T-CMP with Estimated GFR ([id number]) T-Antinuclear Antib (ANA) [phone]) T-C-Reactive Protein [id number]) T-BNP  (B Natriuretic Peptide) ([id number])  Problem # 3:  BIPOLAR DISORDER UNSPECIFIED (ICD-296.80) she says she has not heard of this diagnosis with her although her daughter has it she has done it previously as well when she denied having any psych issues and beeing on medication but EMR says that she has been on different anidepressants before wil try tol pursue this further if her current symptoms perisist in form  of formal diagnosis no medication yet.   Complete Medication List: 1)  Levoxyl 25 Mcg Tabs (Levothyroxine sodium) .... Take 1 tablet by mouth once a day 2)  Estrace 2 Mg Tabs (Estradiol) .... Take 1 tablet by mouth once a day 3)  Ventolin Hfa 108 (90 Base) Mcg/act Aers (Albuterol sulfate) .Marland Kitchen.. 1-2 puff every 6 hrs as needed for shortness of breath  Patient Instructions: 1)  Please schedule a follow-up  appointment in 1 month. 2)  Get plenty of rest, drink lots of clear liquids, and use Tylenol or Ibuprofen for fever and comfort. Return in 7-10 days if you're not better:sooner if you're feeling worse. Prescriptions: VENTOLIN HFA 108 (90 BASE) MCG/ACT AERS (ALBUTEROL SULFATE) 1-2 puff every 6 hrs as needed for shortness of breath  #1 x 0   Entered and Authorized by:   Bethel Born MD   Signed by:   Bethel Born MD on 09/27/2010   Method used:   [facility] to        [facility]  [address] [phone][facility] (retail)       [address]       [place], [place]  [address]       Ph: [phone]       Fax: [phone]   RxID:   [id number]    Orders Added: 1)  Est. Patient Level IV [35573] 2)  T-CMP with Estimated GFR [[id number]]  3)  T-Wet Prep by Molecular Probe [id number] 4)  T-Chlamydia & GC Probe, Genital [[id number]] 5)  T-Antinuclear Antib (ANA) [[id number]] 6)  T-C-Reactive Protein [[id number]] 7)  T-BNP  (B Natriuretic Peptide) [[id number]]   Process Orders Check Orders Results:     [facility]: ABN not required for this insurance Order queued for requisitioning for [facility]: September 27, 2010 2:23 PM Tests Sent for requisitioning (September 27, 2010 4:16 PM):     09/27/2010: [facility] -- T-CMP with Estimated GFR [[id number]] (signed)     09/27/2010: [facility] -- T-Wet Prep by Molecular Probe [[id number]] (signed)     09/27/2010: [facility] -- T-Chlamydia & GC Probe, Genital [[id number]] (signed)     09/27/2010: [facility] -- T-Antinuclear Antib (ANA) [[id number]] (signed)     09/27/2010: [facility] -- T-C-Reactive Protein [phone] (signed)     09/27/2010: [facility] -- T-BNP  (B Natriuretic Peptide) [id number] (signed)     Laboratory Results   Urine Tests  Date/Time Received: .Cynda Familia [facility])   September 27, 2010 1:46 PM  Date/Time Reported: Cynda Familia [facility])  September 27, 2010 1:46 PM   Routine Urinalysis   Appearance: Cloudy Glucose: negative   (Normal Range: Negative) Bilirubin: negative   (Normal Range: Negative) Ketone: negative   (Normal Range: Negative) Spec. Gravity: <1.005   (Normal Range: 1.003-1.035) Blood: negative   (Normal Range: Negative) pH: 6.5   (Normal Range: 5.0-8.0) Protein: negative   (Normal Range: Negative) Urobilinogen: 0.2   (Normal Range: 0-1) Nitrite: negative   (Normal Range: Negative) Leukocyte Esterace: negative   (Normal Range: Negative)       Appended Document: ventolin sample given    Clinical Lists Changes  Medications: Changed medication from VENTOLIN HFA 108 (90 BASE) MCG/ACT AERS (ALBUTEROL SULFATE) 1-2 puff every 6 hrs as needed for shortness of breath to VENTOLIN HFA 108 (90 BASE) MCG/ACT AERS (ALBUTEROL SULFATE) 1-2 puff every 6 hrs as needed for shortness of breath

## 2010-11-04 ENCOUNTER — Encounter: Payer: Self-pay | Admitting: Internal Medicine

## 2010-11-07 ENCOUNTER — Encounter: Payer: Self-pay | Admitting: Internal Medicine

## 2010-11-27 ENCOUNTER — Telehealth: Payer: Self-pay | Admitting: *Deleted

## 2010-11-27 LAB — GLUCOSE, CAPILLARY: Glucose-Capillary: 115 mg/dL — ABNORMAL HIGH (ref 70–99)

## 2010-11-27 LAB — POCT I-STAT, CHEM 8
Calcium, Ion: 1.14 mmol/L (ref 1.12–1.32)
Glucose, Bld: 89 mg/dL (ref 70–99)
HCT: 40 % (ref 36.0–46.0)
Hemoglobin: 13.6 g/dL (ref 12.0–15.0)
Potassium: 4.3 mEq/L (ref 3.5–5.1)
TCO2: 25 mmol/L (ref 0–100)

## 2010-11-27 LAB — CBC
HCT: 38.9 % (ref 36.0–46.0)
MCHC: 32.1 g/dL (ref 30.0–36.0)
RDW: 11.9 % (ref 11.5–15.5)

## 2010-11-27 LAB — POCT CARDIAC MARKERS: CKMB, poc: <1 ng/mL — ABNORMAL LOW (ref 1.0–8.0)

## 2010-11-27 LAB — DIFFERENTIAL
Basophils Absolute: 0 10*3/uL (ref 0.0–0.1)
Basophils Relative: 0 % (ref 0–1)
Eosinophils Absolute: 0.2 10*3/uL (ref 0.0–0.7)
Monocytes Absolute: 0.6 10*3/uL (ref 0.1–1.0)
Neutro Abs: 4.7 10*3/uL (ref 1.7–7.7)

## 2010-11-27 NOTE — Telephone Encounter (Signed)
Pt called asking for appointment.  C/o pain under rt  rib, diarrhea ( no stool today but has not eaten today), headache Onset last Wednesday but getting worse over weekend. She is drinking small amount at time but to much nausea with food.  taking tylenol and ibu for headache with little relief.  We have no appointments today

## 2010-11-27 NOTE — Telephone Encounter (Signed)
Are there appointments for tomorrow?  If so we can put her in for tomorrow.  If she gets any worse or symptoms are severe enough, then she can go to [facility].  Based on information below, sounds like gallbladder is possible etiology.

## 2010-11-27 NOTE — Telephone Encounter (Signed)
Pt informed, will see tomorrow

## 2010-11-27 NOTE — Telephone Encounter (Signed)
Also if patient is having documented fevers or worsening pain, then she should get evaluated sooner.

## 2010-11-28 ENCOUNTER — Ambulatory Visit (INDEPENDENT_AMBULATORY_CARE_PROVIDER_SITE_OTHER): Payer: Self-pay | Admitting: Internal Medicine

## 2010-11-28 ENCOUNTER — Encounter: Payer: Self-pay | Admitting: Internal Medicine

## 2010-11-28 DIAGNOSIS — R109 Unspecified abdominal pain: Secondary | ICD-10-CM

## 2010-11-28 LAB — CBC WITH DIFFERENTIAL/PLATELET
Basophils Relative: 0 % (ref 0–1)
Eosinophils Absolute: 0.2 10*3/uL (ref 0.0–0.7)
Eosinophils Relative: 3 % (ref 0–5)
HCT: 40.1 % (ref 36.0–46.0)
Hemoglobin: 13.4 g/dL (ref 12.0–15.0)
Lymphs Abs: 3.5 10*3/uL (ref 0.7–4.0)
MCH: 30.9 pg (ref 26.0–34.0)
MCHC: 33.4 g/dL (ref 30.0–36.0)
MCV: 92.4 fL (ref 78.0–100.0)
Monocytes Absolute: 0.5 10*3/uL (ref 0.1–1.0)
Monocytes Relative: 6 % (ref 3–12)

## 2010-11-28 LAB — POCT URINALYSIS DIPSTICK
Bilirubin, UA: NEGATIVE
Ketones, UA: NEGATIVE
Leukocytes, UA: NEGATIVE
Nitrite, UA: NEGATIVE
Protein, UA: NEGATIVE
pH, UA: 6

## 2010-11-28 LAB — COMPREHENSIVE METABOLIC PANEL
Alkaline Phosphatase: 37 U/L — ABNORMAL LOW (ref 39–117)
BUN: 6 mg/dL (ref 6–23)
CO2: 22 mEq/L (ref 19–32)
Glucose, Bld: 92 mg/dL (ref 70–99)
Total Bilirubin: 0.4 mg/dL (ref 0.3–1.2)

## 2010-11-28 LAB — LIPASE: Lipase: 23 U/L (ref 0–75)

## 2010-11-28 MED ORDER — METRONIDAZOLE 500 MG PO TABS: 500.0000 mg | ORAL_TABLET | Freq: Three times a day (TID) | ORAL | Status: AC

## 2010-11-28 MED ORDER — OXYCODONE HCL 5 MG PO TABS: 5.0000 mg | ORAL_TABLET | Freq: Three times a day (TID) | ORAL | Status: DC | PRN

## 2010-11-28 MED ORDER — LEVOFLOXACIN 750 MG PO TABS: 750.0000 mg | ORAL_TABLET | Freq: Every day | ORAL | Status: DC

## 2010-11-28 NOTE — Assessment & Plan Note (Addendum)
Patient not ill appearing, abdomen not acute, but symptoms concerning for acute cholecystitis. Will start empiric antibiotic treatment with Levofloxacin and Flagyl due to Cipro allergy. Will give 1 week worth of oxycodone to take as needed for pain. Will check CBC w/ diff, cmet, lipase, and abdominal ultrasound. Review labs and reassess. Patient was instructed to go to the Emergency Room if symptoms worsen despite treatment.  Addendum: Patient can not afford Levofloxacin. Will change to Avelox 400mg  po X 7days.

## 2010-11-28 NOTE — Patient Instructions (Signed)
Make a follow up appointment in 1 week. Start taking Levofloxacin and Flagyl as directed for 7 days. Go to the Emergency Room if symptoms worsen.

## 2010-11-28 NOTE — Progress Notes (Signed)
  Subjective:    Patient ID: Donna Davis, female    DOB: [birth year], 40 y.o.   MRN: [id number]  HPI  40 yr old woman with  Past Medical History  Diagnosis Date  . GERD (gastroesophageal reflux disease)   . Bipolar affective   . Hypothyroidism   . IBS (irritable bowel syndrome)   . Anxiety   . Hearing difficulty     b/l, 2/2 congenital rubella s/p stapedectomy. Using hearing aid  . Headache 04/2005    h/o headache in past that raised question of psuedotumor s/p therapeutic LP with an opening preassure of 18 cm H2O   . Hepatic cyst     6 mm on CT done done in 05/2005  . Polysubstance abuse     including codiene, hydrocodone, tobacco and adderal  comes to the clinic for abdominal pain and fever since 6 days. Patient abdominal pain is constant and located to right upper quadrant, exacerbated with foods. Pain radiates to the back. Associated with fever/chills. Temp 101.5 on Saturday was the highest. Had some vomiting which also started last week but subsided on Sunday. Continues to have fevers but not as high. Patient has had theses symptoms in the past about 3-5 yrs ago. Associated with Headaches. Headache generalized, constant. Patient has been taking tylenol but has not helped. Patient had many episodes of diarrhea from Wednesday-Sunday but has improved. Denies shortness breath, coughing, chest pain, dysuria, hematochezia, or melena.    Review of Systems  [all other systems reviewed and are negative       Objective:   Physical Exam  [vitalsreviewed. Constitutional: She is oriented to person, place, and time. She appears well-developed and well-nourished. No distress.  HENT:  Mouth/Throat: Oropharynx is clear and moist.  Eyes: Conjunctivae and EOM are normal. Pupils are equal, round, and reactive to light.  Neck: Normal range of motion. Neck supple.  Cardiovascular: Normal rate, regular rhythm and normal heart sounds.   Pulmonary/Chest: Effort normal and breath sounds normal.    Abdominal: Soft. Bowel sounds are normal. She exhibits no distension. There is no rebound and no guarding.       Right upper quadrant tenderness on deep inspiration, ttp epigastric region  Musculoskeletal: Normal range of motion.  Neurological: She is alert and oriented to person, place, and time.  Skin: No rash noted. She is not diaphoretic. No erythema.  Psychiatric: She has a normal mood and affect.          Assessment & Plan:

## 2010-11-29 ENCOUNTER — Telehealth: Payer: Self-pay | Admitting: *Deleted

## 2010-11-29 MED ORDER — MOXIFLOXACIN HCL 400 MG PO TABS: 400.0000 mg | ORAL_TABLET | Freq: Every day | ORAL | Status: AC

## 2010-11-29 NOTE — Telephone Encounter (Signed)
Call from pt's pharmacy said that the Levaquin is to expensive.  Wants to get something different called to the pharmacy.  [facility].

## 2010-11-29 NOTE — Progress Notes (Signed)
Addended by: Laren Everts on: 11/29/2010 05:17 PM   Modules accepted: Orders

## 2010-11-30 LAB — POCT URINALYSIS DIPSTICK
Bilirubin Urine: NEGATIVE
Glucose, UA: NEGATIVE mg/dL
Ketones, ur: NEGATIVE mg/dL
Protein, ur: NEGATIVE mg/dL
Protein, ur: NEGATIVE mg/dL
Specific Gravity, Urine: 1.01 (ref 1.005–1.030)
Specific Gravity, Urine: 1.015 (ref 1.005–1.030)
Urobilinogen, UA: 0.2 mg/dL (ref 0.0–1.0)
pH: 8 (ref 5.0–8.0)

## 2010-11-30 LAB — URINE CULTURE

## 2010-11-30 NOTE — Telephone Encounter (Signed)
Call from pt's pharmacy is unable to afford the new medications.  Pt has to pay out of pocket.  Call to pharmacy to find out the meds that the patient can afford. Cipro will cost less than $20.00.  Amoxicillin, or generic Z-Pak.  Doctor can call pharmacy at [phone] to discuss options.

## 2010-12-01 ENCOUNTER — Ambulatory Visit (HOSPITAL_COMMUNITY)
Admission: RE | Admit: 2010-12-01 | Discharge: 2010-12-01 | Disposition: A | Discharge status: Home / Self Care | Payer: Self-pay | Source: Ambulatory Visit | Attending: Internal Medicine | Admitting: Internal Medicine

## 2010-12-01 DIAGNOSIS — R109 Unspecified abdominal pain: Secondary | ICD-10-CM | POA: Insufficient documentation

## 2010-12-01 LAB — URINALYSIS, ROUTINE W REFLEX MICROSCOPIC
Bilirubin Urine: NEGATIVE
Glucose, UA: NEGATIVE mg/dL
Hgb urine dipstick: NEGATIVE
Ketones, ur: NEGATIVE mg/dL
Nitrite: NEGATIVE
Specific Gravity, Urine: 1.012 (ref 1.005–1.030)
pH: 6 (ref 5.0–8.0)

## 2010-12-01 LAB — URINE MICROSCOPIC-ADD ON

## 2010-12-04 NOTE — Telephone Encounter (Signed)
Cipro was called in.

## 2010-12-13 ENCOUNTER — Ambulatory Visit: Payer: Self-pay | Admitting: Obstetrics and Gynecology

## 2010-12-13 DIAGNOSIS — N951 Menopausal and female climacteric states: Secondary | ICD-10-CM

## 2010-12-14 NOTE — Progress Notes (Unsigned)
Donna Davis, Donna [name]:  [id number]  MEDICAL RECORD NO.:  [id number]           PATIENT TYPE:  A  LOCATION:  [facility]                   FACILITY:  [facility]  PHYSICIAN:  Argentina Donovan, MD        DATE OF BIRTH:  16-Jan-1971  DATE OF SERVICE:  12/13/2010                                 CLINIC NOTE  The patient is a 40 year old female who post TAH and bilateral S and O, has had problems managing her postmenopausal symptoms because of breast tenderness and weight gain when she was on estradiol.  We tried on Lo Loestrin and has been working Agricultural consultant, unfortunately, it is very expensive, so we just tried to see whether or not she can manage on Sprintec.  She will soon have insurance, so we will try her on the Sprintec, if that does not work, she will let me know and we will try and get her few samples of the Lo Loestrin to use until her insurance kicks in.  IMPRESSION:  Menopausal symptoms.          ______________________________ Argentina Donovan, MD    PR/MEDQ  D:  12/13/2010  T:  12/14/2010  Job:  [id number]

## 2010-12-27 LAB — URINALYSIS, ROUTINE W REFLEX MICROSCOPIC
Bilirubin Urine: NEGATIVE
Nitrite: NEGATIVE
Specific Gravity, Urine: 1.004 — ABNORMAL LOW (ref 1.005–1.030)
Urobilinogen, UA: 0.2 mg/dL (ref 0.0–1.0)
pH: 7.5 (ref 5.0–8.0)

## 2011-01-04 ENCOUNTER — Ambulatory Visit (INDEPENDENT_AMBULATORY_CARE_PROVIDER_SITE_OTHER): Payer: Self-pay | Admitting: Ophthalmology

## 2011-01-04 ENCOUNTER — Encounter: Payer: Self-pay | Admitting: Ophthalmology

## 2011-01-04 DIAGNOSIS — F319 Bipolar disorder, unspecified: Secondary | ICD-10-CM

## 2011-01-04 DIAGNOSIS — R109 Unspecified abdominal pain: Secondary | ICD-10-CM

## 2011-01-04 DIAGNOSIS — T887XXA Unspecified adverse effect of drug or medicament, initial encounter: Secondary | ICD-10-CM

## 2011-01-04 DIAGNOSIS — T50905A Adverse effect of unspecified drugs, medicaments and biological substances, initial encounter: Secondary | ICD-10-CM

## 2011-01-04 DIAGNOSIS — J329 Chronic sinusitis, unspecified: Secondary | ICD-10-CM

## 2011-01-04 MED ORDER — HYDROCODONE-ACETAMINOPHEN 5-500 MG PO TABS: 2.0000 | ORAL_TABLET | Freq: Four times a day (QID) | ORAL | Status: DC | PRN

## 2011-01-04 MED ORDER — ESOMEPRAZOLE MAGNESIUM 40 MG PO CPDR: 40.0000 mg | DELAYED_RELEASE_CAPSULE | Freq: Every day | ORAL | Status: DC

## 2011-01-04 MED ORDER — AMOXICILLIN-POT CLAVULANATE 875-125 MG PO TABS: 1.0000 | ORAL_TABLET | Freq: Two times a day (BID) | ORAL | Status: AC

## 2011-01-04 NOTE — Assessment & Plan Note (Signed)
This is being followed by Dr. Ezzard Standing, the patient was prescribed Augmentin and I recommended that she complete her course. The patient's symptoms appear to be improving. The patient did spike a fever 2 nights ago to 100.7, but I told her that she should continue to monitor her temperature if he continues to be elevated to contact the clinic as I think that her fever is likely related to her sinusitis. The patient does have tenderness to palpation of the right maxillary sinus which she states is improved.  I will discontinue the patient's prednisone today given that her swelling has improved and her reaction to the medication.

## 2011-01-04 NOTE — Progress Notes (Signed)
Subjective:    Patient ID: Donna Davis, female    DOB: 08-Mar-1971, 40 y.o.   MRN: [id number]  HPI  This is a 40 year old female with a past medical history significant for bipolar disorder, hypothyroidism, and GERD, who presents for followup after having been seen on 3/13 because of abdominal pain and fever. Patient reportedly had 6 days of fevers at that time associated with right upper quadrant abdominal pain the patient was given 7 days of Avelox as well as oxycodone for presumed cholecystitis. Right upper quadrant ultrasound did not associate any stones or gallbladder thickening, labs demonstrate an alkaline phosphatase of 37, UA was negative, lipase was 23. Since that time, the patient has continued to have epigastric/right upper quadrant abdominal pain that is worse after eating. This is associated with occasional mild nausea.  The patient has a history of GERD but is not on any PPI because she said it stabilized in the past. Last week, the patient developed right-sided facial pain, and saw Dr. Ezzard Standing, ENT, who diagnosed her with otitis media, and a right maxillary sinusitis. The patient was given Augmentin, Vicodin, and prednisone to try to help with the inflammation. Since starting these medications, the patient has had increased abdominal pain, and nausea. The patient states that she continues to have occasional low-grade fevers and night before last she had a fever to 100.7. Dr. Ezzard Standing felt this was secondary to her sinusitis. The patient has also been eating more than usual, is extremely jittery, and states that this reaction is similar to one that she had in the past on prednisone.  Review of Systems  Constitutional: Positive for fever. Negative for chills.  Respiratory: Negative for cough and shortness of breath.   Cardiovascular: Negative for chest pain and palpitations.  Gastrointestinal: Positive for vomiting. Negative for diarrhea and constipation.       Objective:   Physical Exam    Constitutional: She appears well-developed and well-nourished.  HENT:  Head: Normocephalic and atraumatic.       Tympanic membranes are clear bilaterally, there is tenderness to palpation of the right maxillary sinus. The patient no longer has right facial swelling.  Eyes: Pupils are equal, round, and reactive to light.  Cardiovascular: Normal rate, regular rhythm and intact distal pulses.  Exam reveals no gallop and no friction rub.   No murmur heard. Pulmonary/Chest: Effort normal and breath sounds normal. She has no wheezes. She has no rales.  Abdominal: Soft. Bowel sounds are normal. She exhibits no distension. There is tenderness.       There is mild right upper quadrant/epigastric tenderness, with no rebound, guarding. There are normal bowel sounds.  Musculoskeletal: Normal range of motion.  Neurological: She is alert. No cranial nerve deficit.  Skin: No rash noted.        Current Outpatient Prescriptions on File Prior to Visit  Medication Sig Dispense Refill  . albuterol (VENTOLIN HFA) 108 (90 BASE) MCG/ACT inhaler Inhale 1-2 puffs into the lungs every 6 (six) hours as needed. Shortness of breath.       . estradiol (ESTRACE) 2 MG tablet Take 2 mg by mouth daily.        Marland Kitchen levothyroxine (SYNTHROID, LEVOTHROID) 25 MCG tablet Take 25 mcg by mouth daily.        Marland Kitchen oxyCODONE (ROXICODONE) 5 MG immediate release tablet Take 1 tablet (5 mg total) by mouth every 8 (eight) hours as needed for pain.  25 tablet  0    Past Medical History  Diagnosis Date  . GERD (gastroesophageal reflux disease)   . Bipolar affective   . Hypothyroidism   . IBS (irritable bowel syndrome)   . Anxiety   . Hearing difficulty     b/l, 2/2 congenital rubella s/p stapedectomy. Using hearing aid  . Headache 04/2005    h/o headache in past that raised question of psuedotumor s/p therapeutic LP with an opening preassure of 18 cm H2O   . Hepatic cyst     6 mm on CT done done in 05/2005  . Polysubstance abuse      including codiene, hydrocodone, tobacco and adderal     Assessment & Plan:

## 2011-01-04 NOTE — Assessment & Plan Note (Signed)
The patient has a history of an allergy to prednisone, this was discussed with Dr. Ezzard Standing and it was decided that she would try to take prednisone again. The patient states that she's had increased anxiety, jitteriness, worsening abdominal pain and nausea as a result of her prednisone.

## 2011-01-04 NOTE — Assessment & Plan Note (Signed)
This appears to be stable at this time

## 2011-01-04 NOTE — Assessment & Plan Note (Signed)
This is continued, the patient has a history of GERD, abdominal pain is worse after meals. Workup has been negative thus far. At this point in time, I will start the patient on Nexium daily to try to improve her abdominal pain as I feel it may be related to GERD versus musculoskeletal in origin. I did inform the patient, that should her abdominal pain continued, she may require endoscopy for further evaluation. The patient denies any blood in her stools or dark stools. The patient does have occasional nausea, but she relates recurrent nausea to prednisone.

## 2011-01-04 NOTE — Patient Instructions (Signed)
Please complete your course of antibiotics, and start taking your Nexium daily. Please return in about one month to ensure improvement in her symptoms. Call sooner if your fevers continue or you develop worsening abdominal pain.

## 2011-01-30 NOTE — Discharge Summary (Signed)
NAMEMINSA, WEDDINGTON                  ACCOUNT NO.:  [id number]   MEDICAL RECORD NO.:  [id number]          PATIENT TYPE:  WOC   LOCATION:  WOC                          FACILITY:  [facility]   PHYSICIAN:  Phil D. Okey Dupre, M.D.     DATE OF BIRTH:  05-Apr-1971   DATE OF ADMISSION:  07/07/2008  DATE OF DISCHARGE:                               DISCHARGE SUMMARY   REASON FOR VISIT:  Today, Donna Davis is status post a hysterectomy in  2005, which removed everything except for her right ovary and presents  today complaining of some right lower abdominal/pelvic pain, frequent  urination as well as intermittent hot flashes.  She describes the  abdominal pain is sharp, intermittent abdominal pain that is worsen when  she has sexual intercourse.  She complains of occasional hot flashes at  night.  Additionally, she relates that she has pain frequently during  the day.  However, on further questioning, she relates  drinking a large  amounts of fluids during the day.   PAST MEDICAL HISTORY:  Reviewed and no significant changes.   PAST SURGICAL HISTORY:  Hysterectomy.   ALLERGIES:  CIPRO, AUGMENTIN, KEFLEX, ULTRAM, SULFA, STEROIDS, Z-PAK,  and DOXYCYCLINE.   Her current medications were reviewed and reconciled per the medication  sheet in the chart.   PHYSICAL EXAMINATION:  VITAL SIGNS:  Blood pressure was 113/72, heart  rate 81, temperature is 99.1, respiratory rate 18, she is 5 feet tall,  and she weighs 162 pounds.  ABDOMINAL:  She has normal active bowel sounds.  Her abdomen is soft  with right lower quadrant tenderness and direct tenderness to palpation.  There is no rebound or guarding appreciated.  Bimanual exam was  performed which revealed no cervix present.  Left adnexa was nontender  and unremarkable.  However, the right adnexa, there was a mild bulge  palpated and some definitive direct tenderness in that area as well.   Urinalysis was performed today, which was negative and had a specific  gravity of less than 1.005.   ASSESSMENT AND PLAN:  1. Right adnexal pain.  The pain most likely representative of an      ovarian cyst.  An pelvic ultrasound was ordered for further      evaluation of this problem.  The patient will follow up in 2 weeks      to review the results of ultrasound and to discuss further      management.  If in fact there is cyst and the patient's pain is      limiting her activities especially intercourse.  We could consider      a course of oral contraceptive pills.  Additionally, the patient      suffers from irritable bowel syndrome and chronic constipation, so      there is a possibility that the right adnexal or right lower      abdominal pain could be related to her constipation.  2. Frequent urination.  Given the specific gravity on the sample today      as well as the patient's history  of drinking so much in addition      that there is nothing else from the urinalysis, it was abnormal      most likely her frequent urination is brought on by her level of      fluids that she is taking in.  3. Hot flashes.  The patient relates having some labs done by her      primary care Donna Davis at the [facility] and was told that she is postmenopausal.  This      certainly would account for hot flashes.  I will try to obtain      those labs to review them and we can discuss them in her next visit      when she returns in 2 weeks.      Odie Sera, DO  Electronically Signed     ______________________________  Javier Glazier. Okey Dupre, M.D.    MC/MEDQ  D:  07/07/2008  T:  07/08/2008  Job:  [id number]

## 2011-01-30 NOTE — Assessment & Plan Note (Signed)
[facility]                         GASTROENTEROLOGY OFFICE NOTE   NAME:Donna Davis, Donna Davis                         MRN:          [id number]  DATE:01/13/2008                            DOB:          09-20-1970    [name] continues with recurrent right upper quadrant pain without real  precipitating or alleviating elements.  She has rather chronic  functional recurrent and refractory constipation and is currently using  MiraLax with some improvement.  She has had extensive underlying and  extensive GI workups and evaluation, including hospitalization last year  and recent emergency room evaluations with multiple laboratory tests,  which have been unremarkable, including repeat recent CT scan of the  abdomen on Oakley 10, 2009.  The patient is status post hysterectomy and  has a history of endometriosis.  She has had some intermittent  hemorrhoidal bleeding with straining.   The patient has had all types of gallbladder evaluations, has even been  seen by surgery, who has not felt that she is a candidate for  cholecystectomy.  She is followed at [facility] outpatient clinic and is  on Levoxyl 25 mcg a day for chronic thyroid dysfunction and famotidine  20 mg a day, for reasons which are unclear.   PHYSICAL EXAMINATION:  She is awake and alert in no acute distress.  She weighs 158 pounds, which is up 10 pounds from a year ago.  Blood  pressure 110/64, pulse 88 and regular.  There is no stigmata of chronic liver disease.  CHEST:  Entirely clear.  She is in a regular rhythm without murmurs, rubs or gallops.  ABDOMEN:  No organomegaly, masses, or significant tenderness.  She does  have some mild distention, but bowel sounds are normal.  Inspection of the rectum is unremarkable, as is rectal exam.  There is  no underlying stool in the rectal vault, and what is present is guaiac  negative.  Her upper extremities were unremarkable.  MENTAL STATUS:  Clear.   ASSESSMENT:  I think Ms. Cortopassi has functional abdominal pain and chronic  constipation-predominant irritable bowel syndrome, all exacerbated by a  large amount of stress in her life from death of family members and  other situational difficulties.   RECOMMENDATIONS:  1. Continues MiraLax 1-2 times per day as needed.  2. A trial of Librax 1 p.o. a.c. t.i.d.  3. Consider referral to [facility] irritable bowel syndrome clinic      depending on clinical course.    Vania Rea. Jarold Motto, MD, Caleen Essex, FAGA  Electronically Signed   DRP/MedQ  DD: 01/13/2008  DT: 01/13/2008  Job #: [id number]

## 2011-02-02 NOTE — Group Therapy Note (Signed)
Donna Davis, PATA NO.:  [id number]   MEDICAL RECORD NO.:  [id number]           PATIENT TYPE:   LOCATION:  [facility]                    FACILITY:  [facility]   PHYSICIAN:  Argentina Donovan, MD             DATE OF BIRTH:   DATE OF SERVICE:  12/26/2005                                    CLINIC NOTE   HISTORY OF PRESENT ILLNESS:  The patient is a 40 year old white female who  in 2005 underwent total abdominal hysterectomy for chronic pelvic pain.  She  has done fairly well postoperatively up until about six weeks ago when she  developed severe what she calls right-sided lower pain.  Ultrasound was done  at that point and showed a small 1 cm complex mass on the right ovary.   PHYSICAL EXAMINATION:  Palpating the ovary did not cause significant pain,  but she seemed to have pain just above the groin in the mid area at the far  end of her previous incision and the slightest push on the lower abdomen  caused pain.  I examined her for possible hernia and could not demonstrate a  hernia.  I do not think the ovary is the cause, although I told her if it  was abnormal we would probably remove it if we went in with a laparoscope.  I told her I did not know what else to do.  I do not have anything else to  offer her.  I offered her a pain clinic.  She has no insurance and said they  would not take her, so we are going to schedule for diagnostic laparoscopy  and possible right oophorectomy.           ______________________________  Argentina Donovan, MD     PR/MEDQ  D:  12/26/2005  T:  12/26/2005  Job:  [id number]

## 2011-02-02 NOTE — Discharge Summary (Signed)
NAMEJUANNA, PUDLO                  ACCOUNT NO.:  [id number]   MEDICAL RECORD NO.:  [id number]          PATIENT TYPE:  INP   LOCATION:  5738                         FACILITY:  [facility]   PHYSICIAN:  Dennis Bast, MD        DATE OF BIRTH:  November 16, 1970   DATE OF ADMISSION:  12/08/2004  DATE OF DISCHARGE:  12/11/2004                                 DISCHARGE SUMMARY   DISCHARGE DIAGNOSES:  1.  Abdominal pain.      1.  Viral gastroenteritis.  2.  Hypothyroidism.  3.  Gastroesophageal reflux disease.   DISCHARGE MEDICATIONS:  1.  Synthroid 25   Dictation ended at this point.      YC/MEDQ  D:  12/11/2004  T:  12/11/2004  Job:  [id number]

## 2011-02-02 NOTE — Group Therapy Note (Signed)
NAME:  Donna Davis, Donna Davis                            ACCOUNT NO.:  [id number]   MEDICAL RECORD NO.:  [id number]                   PATIENT TYPE:  OUT   LOCATION:  [facility]                           FACILITY:  [facility]   PHYSICIAN:  Argentina Donovan, MD                     DATE OF BIRTH:  1970/11/28   DATE OF SERVICE:  09/21/2003                                    CLINIC NOTE   REASON FOR VISIT:  The patient is a 40 year old gravida 3 para 2-0-1-2 with  a long history of pelvic pain especially with her period that seems to be  controlled by her Naprosyn.  She comes in today with a history of several  days of fever of around 101.5 with chills and low backache, and two days ago  began having a very heavy asymptomatic vaginal discharge.  On her physical  examination external genitalia is normal but a white discharge is seen at  the external vagina.  BUS within normal limits.  Vagina is clean and well  rugated with the exception of the heavy curdy discharge.  The cervix is  clean and parous and a wet prep as well as a DNA and GC probes were taken.  The uterus is anterior, normal size and shape with a normal adnexa.  Abdomen  soft, flat, nontender.  No masses, no organomegaly.  The back with no CVA  tenderness; however, there could have been some back pain below that area.  The wet prep failed to show any signs of Trichomonas, yeast, nor bacterial  vaginosis, negative whiff, although the patient does say there is persistent  pelvic discomfort even when sitting.  Urine culture and urinalysis taken as  well as a CBC.   IMPRESSION:  Possible urinary tract infection, __________.  We have  counseled the patient to inducing vaginal acidity when she has a heavy  discharge and will be called depending on what the urine shows.                                               Argentina Donovan, MD    PR/MEDQ  D:  09/21/2003  T:  09/21/2003  Job:  [id number]

## 2011-02-02 NOTE — Assessment & Plan Note (Signed)
[name]                         GASTROENTEROLOGY OFFICE NOTE   NAME:Donna Davis, Donna Davis                         MRN:          [id number]  DATE:11/12/2006                            DOB:          1970-11-21    Donna Davis is a 40 year old white female whom I have seen with refractory  constipation.  She was doing well on MiraLax and stool softeners.  She  has a history of pelvic adhesions and underwent laparoscopic  hysterectomy and lysis of adhesions by Dr. Okey Dupre in [place] 2007.  She  really did well since that time until this past Friday night, when she  had the sudden onset of right upper quadrant pain radiating to her back,  with nausea and low-grade fever.  She was seen in [facility]  and had a normal CBC and metabolic profile and upper abdominal  ultrasound exam.  She was given codeine for pain relief and discharged  and continues with rather severe constant right upper quadrant pain made  better by lying on her stomach.  She has had anorexia, nausea, and is  not eating much.  She denies clay-colored stools, dark urine, icterus,  or chills.  She has not eaten in several days and has some mild  constipation, but has had no melena or hematochezia.  She denies  abdominal or chest trauma.  She has no cardiovascular, pulmonary, or  genitourinary complaints.  Has had negative urinalysis apparently in the  emergency room, although I cannot find that report.   PAST MEDICAL HISTORY:  Acid reflux disease, and she takes over-the-  counter Pepcid AC.  She denies dysphagia or typical reflux symptoms at  this time.  Her last colonoscopy was performed in May 2007 and was  unremarkable, including visualization of her appendix.  Previous  endoscopy was done in 2000 and was entirely normal.  She was last  hospitalized in March 2006 because of recurrent abdominal pain, which  she has had for several years, and has been diagnosed as having IBS.  With her last  discharge in March 2006, she was given a diagnosis of  viral gastroenteritis which seems somewhat unusual.  She does,  however, on review of her chart have a history of chronic recurrent  abdominal pain, and previously she has been a patient of Dr. Ewing Schlein, who  felt that most of her problems were possibly musculoskeletal.   PHYSICAL EXAMINATION:  GENERAL:  She is a female who is crying in pain  today, but certainly is in no acute distress.  I cannot appreciate  stigmata of chronic liver disease.  VITAL SIGNS:  She weighs 152 pounds, her blood pressure is 112/70, her  temperature is 99.5.  CHEST:  Entirely clear to percussion and auscultation.  HEART:  I cannot appreciate murmurs, gallops, or rubs, and she is in a  regular rhythm.  ABDOMEN:  There is no real hepatosplenomegaly, masses, and only slight  tenderness to the right upper quadrant to deep palpation.  There is no  CVA tenderness.  Abdominal exam otherwise is normal, and bowel sounds  are normal.  EXTREMITIES:  Unremarkable.   ASSESSMENT:  Donna Davis has rather acute abdominal pain which seems to have  been a problem for her on and off for several years.  Her pain seems  somewhat out of proportion to a diagnosis of irritable bowel syndrome or  gastroenteritis, and I wonder if she has acalculous cholecystitis.  Her  chronic constipation seems to be doing well at this time on MiraLax.  She tried Zelnorm in the past, without improvement.   RECOMMENDATIONS:  I have sent her over to the hospital for stat CCK-HIDA  scanning.  If this is positive, we will seek surgical referral.  If it  is negative, she probably will need hospitalization and further  evaluation.  Some consideration if she is hospitalized should be given  to checking atypical causes of abdominal pain such as complement  deficiencies or porphyria.     Vania Rea. Jarold Motto, MD, Caleen Essex, FAGA  Electronically Signed    DRP/MedQ  DD: 11/12/2006  DT: 11/12/2006  Job #:  [id number]

## 2011-02-02 NOTE — Group Therapy Note (Signed)
Donna Davis, BLIZZARD NO.:  [id number]   MEDICAL RECORD NO.:  [id number]          PATIENT TYPE:  WOC   LOCATION:  [facility]                   [facility]ACILITY:  [facility]   PHYSICIAN:  Argentina Donovan, MD        DATE OF BIRTH:  [birth year]   DATE OF SERVICE:  05/08/2006                                    CLINIC NOTE   HISTORY OF PRESENT ILLNESS:  The patient is a 40 year old white female with  a history of endometriosis, still having her right ovary, and chronic pelvic  pain.  In 2005, underwent total abdominal hysterectomy and left salpingo-  oophorectomy.  We have placed her on Depo Lupron in order to see whether or  not this would relieve the pain.  It seems to have done well over the last  three months as far as the pain relief; however, the other side effects of  this medication have really been intolerable for the patient, complete loss  of libido, constant headaches, severe sleep dysfunction.  Since we do know  that that ovary is probably responsible for her pain since the pain  abdominally is better, I suggested that she comes off of the Depo Lupron and  will not give her any further.  If the pain recurs, we will just relieve it  by removing that ovary.   IMPRESSION:  Chronic endometriosis with pelvic pain and side effects  secondary to Depo Lupron.           ______________________________  Argentina Donovan, MD     PR/MEDQ  D:  05/08/2006  T:  05/09/2006  Job:  [id number]

## 2011-02-02 NOTE — Discharge Summary (Signed)
Donna Davis, Donna [name]:  [id number]   MEDICAL RECORD NO.:  [id number]          PATIENT TYPE:  INP   LOCATION:  6704                         FACILITY:  [facility]   PHYSICIAN:  Manning Charity, MD     DATE OF BIRTH:  August 18, 1971   DATE OF ADMISSION:  09/25/2004  DATE OF DISCHARGE:  09/27/2004                                 DISCHARGE SUMMARY   DISCHARGE DIAGNOSES:  1.  Right ear/mastoid pain thought to be secondary to zoster flare.  2.  History of multiple bouts of otitis media with resulting hearing loss      and hearing aide dependence.  3.  History of drug dependence with codeine, hydrocodone and Adderall.  4.  Irritable bowel syndrome.  5.  Gastroesophageal reflux disease.  6.  History of idiopathic galactorrhea.  7.  Hypothyroidism with TSH of 2.495 on November 2005.  8.  Status post hysterectomy in [place] 2005.   DISCHARGE MEDICATIONS:  1.  Ciprodex Otic four drops in the right ear b.i.d. x7 days.  2.  Benzocaine ear drops for pain.  3.  Darvocet q.4h. which the patient will taper herself and dispensed #30.  4.  Synthroid 25 mcg p.o. daily.  5.  FiberCon.   PROCEDURE PERFORMED:  CT scan of the head, neck and orbit on September 25, 2004, which was negative.   DISPOSITION AND FOLLOW-UP:  The patient will follow up at the outpatient  clinic on October 04, 2004, at 2:30 p.m.  At that time her pain control  needs to be readdressed and to make sure that she has not had any worsening  of symptoms.  She may benefit from an ENT evaluation as an outpatient as an  audiogram was suggested by Dr. Jearld Fenton.  The patient has significant trouble  with medications in the past and does not want to take any new medications,  therefore has refused to take any new Valtrex or Neurontin for possible  shingles and post herpetic neuralgia.  If her pain is worsened, then this  may need to be readdressed.   HISTORY AND PHYSICAL WITH ADMISSION LABS:  Patient is a 40 year old white  female with past medical history as mentioned above who reported to the  outpatient clinic with continued pain in her right ear and now post  auricular pain.  She was seen in the outpatient clinic on September 19, 2004,  at which time she had been on day #6 of amoxicillin for otitis media started  by an urgent care center.  At that time she reported that her pain had  somewhat improved so she was told to complete a full 10-day course and then  return if she continued to worsen.  On Saturday, September 23, 2004, she  visited urgent care again and was started on Augmentin and  neomycin/polymixin ear drops for worsening pain but continued to have pain  and fevers up to 102 degrees and worsening right ear pain now tracking to  her mastoid area.  The patient reports that she has had multiple ear  infections in the past  which would spontaneously drain and as a result, she  has lost a significant amount of hearing in both ears and uses hearing  aides.   PHYSICAL EXAMINATION:  GENERAL APPEARANCE:  She was a tearful, well-  developed, well-nourished white female in mild to moderate distress  secondary to pain.  VITAL SIGNS:  Temperature 100.2, pulse 106, blood pressure 123/83,  respiratory rate 18, O2 saturation 98% on room air.  HEENT:  Pupils are equal, round, reactive to light and accommodation.  Extraocular movements intact.  Her right tympanic membrane was slightly  opacified with some signs of old scarring, but not bulging and no sign of  erythema of the actual tympanic membrane.  She did have an area of erythema  in the external canal and was very tender on otoscopic examination.  She  also was very tender to palpation of her right mastoid process tracking down  into her neck and up behind the ear.  NECK:  Her neck was supple with some focal tenderness over the mastoid  process and posterior to her ear with flexion and extension but no nuchal  rigidity and no diffuse pain or headache.  CHEST:   Clear to auscultation bilaterally without wheeze.  CARDIOVASCULAR:  Regular rate and rhythm without murmurs.  No JVD.  ABDOMEN:  Soft, nontender and nondistended with positive bowel sounds.  EXTREMITIES:  She had no clubbing, cyanosis, or edema.  She did have  positive tender right submandibular glands and positive nontender left  submandibular gland.  NEUROLOGIC:  Neurologic examination is completely nonfocal with 2+ reflexes  throughout.  No symmetrical defects.  She does have hearing deficit as  mentioned above.   ADMISSION LABORATORY DATA:  White count of 9.0, hemoglobin 13.6, hematocrit  40.8 and platelets of 257.  Her CMET showed a sodium of 140, potassium 4.3,  chloride 110, CO2 24, glucose 85, BUN 6, creatinine 0.6.  Her LFTs were  within normal limits except for a mildly decreased alkaline phosphatase of  32.  PT, PTT and INR were within normal limits.  Of note, her sed rate was  10.   HOSPITAL COURSE BY ACTIVE PROBLEM:  PROBLEM #1 -  RIGHT EAR PAIN:  The  patient was admitted from the outpatient clinic and was seen immediately by  Dr. Jearld Fenton of ENT.  CT scan was ordered with the results described above.  Initially she was treated with IV Zosyn for suspected mastoiditis but per  Dr. Jearld Fenton recommendations, this was stopped and it was felt that this was  most likely due to zoster flare.  It was suggested that she go ahead and be  started on Valtrex and we recommended Neurontin as well but the patient  refused to take any new medications as she has had trouble with medications  in the past.  Her pain gradually improved on Darvocet only but did receive  one dose of morphine overnight on the day of admission secondary to pain.  She asked to be able to go home and it was suggested that she leave her  hearing aide out of her right ear as much as possible and to clean it every day.  She was sent home on ear drops as mentioned above and some benzocaine  numbing p.r.n. medication.    DISCHARGE LABORATORY DATA:  On the day prior to discharge, the patient had a  white count of 6.9, hemoglobin 12.7, hematocrit 37.8 and platelets 221.  Her  UA was negative.  Of note, her sed  rate was 10, which is normal.   She has been afebrile throughout this admission and on discharge her vital  signs showed a temperature of 98.7, pulse 72, respiratory rate 18, blood  pressure 91/55, saturating 97% on room air.  She is ambulatory and  discharged in fair condition.       KK/MEDQ  D:  09/27/2004  T:  09/27/2004  Job:  [id number]   cc:   Will Averett, M.D.  [address] [place] [address]  Fax: [phone]   Suzanna Obey, M.D.  [address] [place]  [place] [address]  Fax: [phone]

## 2011-02-02 NOTE — Op Note (Signed)
NAME:  Donna Davis, Donna Davis                            ACCOUNT NO.:  [id number]   MEDICAL RECORD NO.:  [id number]                   PATIENT TYPE:  INP   LOCATION:  9399                                 FACILITY:  [facility]   PHYSICIAN:  Phil D. Okey Dupre, M.D.                  DATE OF BIRTH:  September 04, 1971   DATE OF PROCEDURE:  01/13/2004  DATE OF DISCHARGE:                                 OPERATIVE REPORT   PREOPERATIVE DIAGNOSES:  1. Severe pelvic adhesions.  2. Chronic pelvic pain.   POSTOPERATIVE DIAGNOSES:  Chronic pelvic pain pending pathology report.   ANESTHESIA:  General anesthesia.   ESTIMATED BLOOD LOSS:  100 mL.   SURGEON:  Javier Glazier. Okey Dupre, M.D.   FIRST ASSISTANT:  Debbrah Alar, M.D.   PROCEDURE:  Total abdominal hysterectomy.   FINDINGS:  Uterus was normal size, shape, somewhat soft in consistency with  nodular area just above the bladder flap anteriorly.  Left ovary and  fallopian tube were missing from previous surgery.  The right ovary was  completely normal as was the tube on that side.  There were no significant  pelvic adhesions found.  Upper abdominal viscera was explored and found to  be within normal limits.   DESCRIPTION OF PROCEDURE:  Under satisfactory general anesthesia, the  patient was in supine position, after Foley catheter had been placed in the  urinary bladder and the vagina was prepped, the abdomen was prepped and  draped in the usual sterile manner and entered through a Pfannenstiel  suprapubic incision extending for a total length of 14 cm and situated 2 cm  above the symphysis pubis.  On entering the peritoneal cavity, the findings  were as above.  Straight Heaney clamps were placed parallel to the uterus  just adjacent and across the fallopian tube on the right side and extended  down to get the round ligament.  On the left side, the remaining stump of  the tube plus was taken down to the round ligament with the same clamp for  traction.  The round  ligaments were then ligated with one chromic catgut  suture ligatures, divided and the anterior leaf of the broad ligament opened  parallel to the uterus and extending around the superior anterior portion of  the cervix.  Openings were then made in the avascular portions of the broad  ligament on the right side and tie placed medial to the ovary on that side.  Tissue medial to the tie was divided and the lateral pedicle was then  ligated with a suture ligature of #1 chromic.  The bladder was then pushed  away from the anterior surface of the uterus with sharp and blunt dissection  and the uterine vessels were skeletonized, clamped with curved Heaney  clamps, divided and doubly ligated with #1 chromic catgut suture ligature.  Cardinal ligaments were then clamped with straight Heaney  clamps, divided  and ligated with #1 chromic catgut suture ligature.  The straight Heaney  clamps were then placed just below the cervix across the vaginal cuff and  the uterus was excised from the apex of the vagina by sharp dissection.  Angle sutures of #1 chromic were then placed in each of the lateral vaginal  cuff angles and two figure-of-eights used to close the vaginal cuff.  Minimal bleeding occurred during the entire procedure.  All of the areas  were observed for bleeding.  Pelvis was irrigated, observed once again and  the self-retaining Balfour retractor was then removed.  the area once again  observed for bleeding and none was noted.  Lap tapes removed.  Tape, sponge,  needle and instrument counts were reported as correct at this point and  abdominal fascia was closed with a continuous running 0 Vicryl on an  atraumatic needle.  Subcutaneous bleeders were controlled with hot cautery.  Skin edges approximated with skin staples.  Tape, instrument, sponge and  needle count were once again reported as correct.  Dry sterile dressing was  applied.  Foley catheter was draining clear amber urine at the end of  the  procedure and the uterus was sent as a specimen to pathology.                                               Phil D. Okey Dupre, M.D.    PDR/MEDQ  D:  01/13/2004  T:  01/13/2004  Job:  [id number]

## 2011-02-02 NOTE — H&P (Signed)
NAMEALFA, LEIBENSPERGER                  ACCOUNT NO.:  [id number]   MEDICAL RECORD NO.:  [id number]          PATIENT TYPE:  INP   LOCATION:  0101                         FACILITY:  [facility]   PHYSICIAN:  Hedwig Morton. Juanda Chance, MD     DATE OF BIRTH:  05-02-1971   DATE OF ADMISSION:  11/12/2006  DATE OF DISCHARGE:                              HISTORY & PHYSICAL   CHIEF COMPLAINT:  Severe right upper quadrant and right flank pain x4  days.   HISTORY:  Donna Davis is a pleasant, generally healthy, 40 year old, white  female who is known to Dr. Jarold Motto, also known to the [facility]. She does have a diagnosis of hypothyroidism and IBS which has  been constipation predominant. She has also been told that she has  endometriosis. At this time, she presents with complaints of severe  right upper quadrant abdominal pain radiating around in her back, onset  on Friday, November 08, 2006. Her pain has been constant, stabbing in  nature and exacerbated by eating and movement. She particularly feels  that it is worse with sitting up. She has had some low-grade  temperatures, some nausea but no vomiting. No diarrhea, melena or  hematochezia. No dysuria or hematuria. She has not had any known trauma  or injury. She does feel that the pain is somewhat worsened with a deep  breath but she has not been short of breath, had any cough, sputum  production, etc. She feels that the pain is a little bit worse with p.o.  intake. The patient was seen in the emergency room on November 09, 2006  and had an abdominal ultrasound which was negative, CBC and a BMET which  were normal and was discharged home. Today, she had a CCK HIDA scan as  an outpatient after seeing Dr. Jarold Motto in the office. This is also  normal. At this time, she is admitted through the emergency room for  pain control and further diagnostic workup.   CURRENT MEDICATIONS:  1. Levoxyl 0.25 daily.  2. MiraLax or stool softener daily.  3.  Hydrocodone p.r.n.   ALLERGIES:  SULFA which caused vaginal blistering and steroids which  make her crazy.   PAST MEDICAL HISTORY:  Pertinent for hypothyroidism, endometriosis and  IBS.   FAMILY HISTORY:  Pertinent for diabetes mellitus and coronary artery  disease.   SOCIAL HISTORY:  The patient is married, she has 2 children, she works  as a Scientist, physiological. She is a smoker 1/2 to 1 pack per day. No ETOH.   REVIEW OF SYSTEMS:  CARDIOVASCULAR:  Denies any chest pain or anginal  symptoms. PULMONARY:  Negative for cough, shortness of breath or sputum  production. GENITOURINARY:  Negative for dysuria, urgency or frequency.  She has not had any history of ureterolithiasis.  MUSCULOSKELETAL:  Denies any injury, no prior disk disease. Has no  current complaint of spine pain. GI:  As outlined above. GYN:  The  patient reports that she had a laparoscopy in the spring of 2007 and was  told she had endometriosis and was placed on Lupron but  then had side  effects and was not kept on it.   PHYSICAL EXAMINATION:  GENERAL:  Well-developed, white female in no  acute distress. She is uncomfortable, crying, unable to lie still,  prefers to lie on her stomach.  VITAL SIGNS:  Temperature 99.4, blood pressure 115/82, pulse in the 90s,  respirations 16, sat is 98 on room air.  HEENT:  Normocephalic, atraumatic. EOMI. PERRLA. Sclera anicteric. There  is no JVD.  CARDIOVASCULAR:  Regular rate and rhythm with S1 and S2. No murmurs,  rubs or gallops.  PULMONARY:  Clear to A&P.  ABDOMEN:  Soft, she is mildly tender in the right mid quadrant, right  upper quadrant and the right flank. There is no rib cage tenderness, no  chest wall tenderness, no rash, no palpable mass or hepatosplenomegaly.  No guarding or rebound. Bowel sounds are active.  RECTAL:  Hemoccult negative and without mass.  EXTREMITIES:  Without clubbing, cyanosis or edema.  NEUROLOGIC:  Grossly nonfocal.   LABORATORY DATA:  Pending at  time of admission.   IMPRESSION:  42. A 40 year old white female with severe right upper quadrant pain      radiating around into her back x5 days associated with a low grade      fever and mild nausea with negative ultrasound, negative CCK HIDA,      etiology is not clear. Rule out possible ureterolithiasis,      nonerupted herpes zoster, other intraabdominal inflammatory      process, rule out peptic ulcer disease, rule out neuropathy or      radicular pain.  2. Endometriosis.  3. Hypothyroidism.   PLAN:  The patient is admitted to the service of Dr. Lina Sar for IV  fluid hydration, pain control, bowel rest, IV PPI, CT scan of the  abdomen and pelvis and if this is negative may pursue MRI of the spine.  For details, please see the orders.      Amy [place], PA-C      Dora M. Juanda Chance, MD  Electronically Signed    AE/MEDQ  D:  11/13/2006  T:  11/13/2006  Job:  [id number]

## 2011-02-02 NOTE — Op Note (Signed)
[facility]  Patient:    Donna Davis, Donna Davis                         MRN: [id number] Proc. Date: 04/16/00 Adm. Date:  [id number] Disc. Date: [id number] Attending:  Wandalee Ferdinand                           Operative Report  PREOPERATIVE DIAGNOSIS: 1. Right ovarian cyst. 2. Lower abdominal/pelvic pain, left greater than right.  Differential    includes adhesions, endometriosis, primary gastrointestinal. 3. Status post left salpingo-oophorectomy and partial right salpingectomy.  POSTOPERATIVE DIAGNOSIS: 1. Right ovarian cyst. 2. Adhesions from the left colon to the left pelvic sidewall.  OPERATION:  Diagnostic/operative laparoscopy with right ovarian cystectomy and lysis of adhesions.  SURGEON:  Rudy Jew. [name] Royalty, M.D.  ANESTHESIA:  General.  ESTIMATED BLOOD LOSS:  Less than 50 cc.  COMPLICATIONS:  None.  PACKS AND DRAINS:  None.  DESCRIPTION OF PROCEDURE:  The patient was taken to the operating room and placed in the dorsal supine position.  After adequate general anesthesia was administered, she was placed in the lithotomy and prepped and draped in the usual manner for abdominal and vaginal surgery.  A posterior weighted retractor was placed per vagina and the anterior lip of the cervix was grasped with a single-toothed tenaculum.  ____________  uterine manipulator was placed per cervix and the bladder was drained with a red rubber catheter.  A 1.5 cm infraumbilical incision was made in the transverse plane.  Using an open laparoscopic technique, the peritoneal cavity was entered with sharp and blunt dissection and the open laparoscope placed.  It was held in place with the Vicryl stay suture in the fascia.  Next, three 5 mm trocars were placed in the left and right lower quadrants respectively as well as the midline. Transillumination and direct visualization techniques were used.  At this point a thorough pelvic was conducted.  The uterus  was normal size, shape and contour without evidence of fibroids or endometriosus.  The left adnexa was surgically absent.  The right fallopian tube was surgically absent.  The right ovary was approximately 4 x 3 x 2 cm with a 2 to 3 cm cyst prominently located on it.  Appropriate photos were obtained.  The remainder of the peritoneal surfaces were smooth and glistening.  There were some adhesions from the left colon to the left pelvic sidewall which did not necessarily appear to be pathologic but in the context of the patients left lower quadrant pain, decision was made to proceed with lysis.  Using the laser at 14 watts power with a GRP6 probe, the cyst wall was incised and the cyst separated from the overlying ovarian cortex using sharp and blunt dissection.  The cyst was removed in its entirety and submitted to pathology for histologic studies.  Hemostasis was obtained with the bipolar cautery. The adhesions of the left colon to the left pelvic side wall were easily lysed using sharp and blunt dissection and bipolar cautery and laser.  It should be mentioned that prior to any surgical manipulation, cytologic specimen was obtained with pelvic washings and submitted separately to cytology for studies.  Hemostasis was noted.  At this point the patient was felt to have benefited maximally from the surgical procedure.  Abdominal instruments were removed and the pneumoperitoneum evacuated. Fascial defects were closed with 0 Vicryl in an interrupted  fashion.  The skin was closed with 3-0 chromic in a subcuticular fashion.  The patient tolerated the procedure well and was returned to the recovery room in good condition. DD:  04/17/00 TD:  04/18/00 Job: [id number]

## 2011-02-02 NOTE — Discharge Summary (Signed)
[place]  Patient:    Donna Davis, Donna Davis Visit Number: [id number] MRN: [id number]          Service Type: MED Location: [phone] Attending Physician:  Farley Ly Dictated by:   Blanch Media, M.D. Admit Date:  08/15/2001 Discharge Date: 08/17/2001   CC:         Hilario Quarry, M.D.   Discharge Summary  DISCHARGE DIAGNOSES: 1. Constipation. 2. Hypothyroidism. 3. History of narcotic addiction.  DISCHARGE MEDICATIONS:  None.  HISTORY OF PRESENT ILLNESS:  The patient is a 40 year old white female with past medical history significant for IBS with constipation, not diarrhea, who presents with constipation x1 week to 10 days.  Last normal bowel movement was two days ago.  Constipation normally occurs once every several months and usually responds to increased intake of water and fiber.  In the past, constipation has been severe enough to require an enema but infrequently. This evening, when the patient used an enema, she passed a small amount of stool only.  The enema fluid that she passed was colored red and water in the toilet bowl was also colored red.  Bright red blood per rectum was also noticed on the toilet paper with wiping.  The patient complained of feeling full and nauseated after meals.  No emesis.  Patient with history of multiple laparotomies.  The patient still has her appendix.  The patient was diagnosed with IBS in the past after multiple abdominal pain workups including multiple laparotomies that were negative.  On admission, the patients abdomen was soft and nondistended but mildly tender to deep palpation in all four quadrants. Positive bowel sounds in all four quadrants.  No rebound, no guarding, no organomegaly.  Rectal exam was Hemoccult positive with no external hemorrhoids.  Rectal exam was otherwise within normal limits.  Initial CBC showed a white count of 12.0, hemoglobin of 13.4, and hematocrit of  40.2. BMET was within normal limits.  LFTs were also within normal limits.  Urine pregnancy test was negative.  UA was within normal limits.  HOSPITAL COURSE: #1 - CONSTIPATION:  The initial abdominal films showed significant stool within the colon, consistent with constipation.  There was no evidence for a small bowel obstruction.  Sorbitol 30 cc p.o. q.4h. was added to a regimen of ______ enemas q.2h.  On December 1, the patient had two large bowel movements.  The patient was advised to continue to drink plenty amounts of fluids and significant intake of fiber.  The patient passed no visible blood per rectum during her hospital stay.  #2 - HYPOTHYROIDISM:  The patient was placed on home medication Levoxyl 25 mg p.o. q.d.  The patient was asymptomatic during this admission.  #3 - HISTORY OF NARCOTIC ADDICTION:  Not active during this admission.  The patient was not treated with narcotics. Dictated by:   Blanch Media, M.D. Attending Physician:  Farley Ly DD:  09/04/01 TD:  09/05/01 Job: [id number]

## 2011-02-02 NOTE — Group Therapy Note (Signed)
NAME:  Donna Davis, Donna Davis                            ACCOUNT NO.:  [id number]   MEDICAL RECORD NO.:  [id number]                   PATIENT TYPE:  OUT   LOCATION:  [facility]                           FACILITY:  [facility]   PHYSICIAN:  Argentina Donovan, MD                     DATE OF BIRTH:  1970-10-05   DATE OF SERVICE:  01/25/2004                                    CLINIC NOTE   REASON FOR VISIT:  This is a patient who underwent total abdominal  hysterectomy on [name] 28, 2005 for endometriosis and chronic pelvic pain.  Has a physical problem with irritable colon syndrome, has been on iron and  has a problem with constipation.  I am going to recheck her CBC today and  see if we cannot cut back on the iron.  She is using Darvocet for pain and  she has had some dependency on codeine products in the past.  The wound is  healing well with the exception of an unequal elevation between the lower  and upper portion of it with some exposure of granulation tissue in  transverse wound which was treated with silver nitrate.  I will continue to  see the patient weekly until this is improved.  Postoperative examination  otherwise satisfactory.                                               Argentina Donovan, MD    PR/MEDQ  D:  01/25/2004  T:  01/26/2004  Job:  [id number]

## 2011-02-02 NOTE — Group Therapy Note (Signed)
NAME:  Donna Davis, Donna Davis                            ACCOUNT NO.:  [id number]   MEDICAL RECORD NO.:  [id number]                   PATIENT TYPE:  OUT   LOCATION:  [facility]                           FACILITY:  [facility]   PHYSICIAN:  Argentina Donovan, MD                     DATE OF BIRTH:  July 19, 1971   DATE OF SERVICE:  02/22/2004                                    CLINIC NOTE   REASON FOR VISIT:  This patient is a 40 year old who underwent total  abdominal hysterectomy on Anajulia 28 and is in for a 6-week checkup. The  abdominal incision is healing well.  The vaginal exam was normal.  She has  problems as she has had in the past with signs of irritable colon with  intermittent constipation followed by episodes of diarrhea.  She has a very  difficult time tolerating any kind of discomfort or pain.  She does stay  away from narcotics because she has been somewhat addicted because of  medications given to her, I think, for her pain syndromes that she has had  in the past and we have recommended she go back to her internist for a  complete workup - which we are hoping she will do - and I will recheck her  in 6 months and see how she is doing from our point of view.   DIAGNOSIS:  Well-healed post total abdominal hysterectomy.                                               Argentina Donovan, MD    PR/MEDQ  D:  02/22/2004  T:  02/22/2004  Job:  [id number]

## 2011-02-02 NOTE — Assessment & Plan Note (Signed)
[facility]                         GASTROENTEROLOGY OFFICE NOTE   NAME:Davis, Donna                           MRN:          [id number]  DATE:11/26/2006                            DOB:          [birth year]    Reshma continued with severe right upper quadrant pain made worse within  15-20 minutes of eating with associated nausea and vomiting.  She  relates that she can only keep down liquids; and that her codeine only  causes temporary improvement of rather severe, sharp, stabbing, constant  pain in her right upper quadrant.  She says it does hurt to take a deep  breath, but denies cough or sputum production.  She has no other real GI  or general medical symptomatology, certainly no genitourinary problems.   She recently was discharged from [facility] because of the  severity of her pain; and had a complete repeat negative workup  including ultrasound, CCK/HIDA scan, endoscopy, colonoscopy, chest x-  ray, etcetera.  She is followed by Dr. Antony Contras at [facility], and he has taken excellent care of this patient.  Everyone remains fairly well perplexed as to the etiology of her pain.  On reviewing her chart, she had a similar episode of pain that required  hospitalization from March 24 to December 11, 2004 and she was given a  diagnosis of viral gastroenteritis, which seemed very unlikely.   She does have a history of endometriosis and recently saw her  gynecologist, who enclosed a note, and her pelvic exam was normal.  Again, it is hard to imagine that endometriosis could cause her right  upper quadrant pain.  She relates that her MOTHER underwent similar  problems with her gallbladder; and was found to have acalculous  cholecystitis that responded to surgical intervention.   The patient denies trauma to her right upper quadrant area and has no  history of angioedema or any other collagen vascular disease.  It is  unclear to  me after looking at her chart as to exactly what type of labs  she had in terms of complement levels, etcetera, during her recent  hospitalization.  She was discharged on codeine and told to follow up  with me.  She is status post hysterectomy.   PHYSICAL EXAMINATION:  The patient in the room today was in the fetal  position, crying when I entered the room.  She is awake and alert and  oriented x3.  She weighs 147 pounds, which is really her normal weight, and blood  pressure was 100/78.  Pulse was 72 and regular.  Her temperature was  99.4 orally.  I could not appreciate stigmata of chronic liver disease or icterus.  Her chest was entirely clear to percussion and auscultation; and she was  in a regular rhythm without murmurs, gallops or rubs.  I could not appreciate hepatosplenomegaly, abdominal masses, and her  tenderness was deep in her right upper quadrant area.  There was  certainly no gallbladder tip palpable.  Bowel sounds were active.  There was no CVA tenderness.  MENTAL STATUS:  Clear.  Peripheral extremities were unremarkable.   ASSESSMENT:  I remain perplexed, as do multiple physicians, as to the  cause of Donna Davis's severe, constant right upper quadrant pain.  She  certainly seems to be in some distress today.  It is hard to believe  that this pain is all functional.  There was some consideration to  possible shingles, but she has never had any evidence of a rash; and she  even had scans of her thoracic spine which were normal for any herniated  disk problems.  In any case, I think, at this point the patient will  probably need laparoscopic exam with empiric cholecystectomy,  examination of her intra-abdominal areas, since we really seem to be  getting nowhere in terms of her management.   RECOMMENDATIONS:  1. I have made an appointment for her to see Dr. Kendrick Ranch for      consideration of laparoscopic exam and cholecystectomy.  2. I have given her Percodan one q.4-6 h.  to use with p.r.n. Phenergan      suppositories while continuing a clear liquid diet.  3. Continue her thyroid replacement therapy, 25 mcg a day.     Vania Rea. Jarold Motto, MD, Caleen Essex, FAGA  Electronically Signed    DRP/MedQ  DD: 11/26/2006  DT: 11/27/2006  Job #: [id number]   cc:   Sheppard Plumber. Earlene Plater, M.D.  Antony Contras, M.D.

## 2011-02-02 NOTE — Group Therapy Note (Signed)
NAME:  Donna Davis, Donna Davis                            ACCOUNT NO.:  [id number]   MEDICAL RECORD NO.:  [id number]                   PATIENT TYPE:  OUT   LOCATION:  [facility]                           FACILITY:  [facility]   PHYSICIAN:  Tinnie Gens, M.D.                   DATE OF BIRTH:  August 17, 1971   DATE OF SERVICE:  03/18/2003                                    CLINIC NOTE   CHIEF COMPLAINT:  Abdominal pain and recurrent bacterial vaginosis.   HISTORY OF PRESENT ILLNESS:  The patient is a 40 year old gravida 3, para 2,  AB1 who comes in today with pelvic pain for the last six months.  She was  seen in the [facility] ED as well as in the internal medicine clinic for this  problem.  She is currently being treated with clindamycin 300 mg p.o. and  states that her bacterial vaginosis, at least symptomatology wise seems much  worse.  Additional to is she is status post LSO for ovarian torsion, right  ovarian cystectomy, partial right salpingectomy, and lysis of adhesions.  Her uterus is still intact.  She does have regular menses.  She reports they  come every two to three weeks for the past six months and she is having  significant amount of abdominal pain.  She states initially started about 7-  10 days prior to her menses but now the pain is every day and constant.  She  apparently is doing very little, in fact, she has completely stopped having  sex because of the dyspareunia.  Her mother has a history of endometriosis  during her late 61s.  The patient used to see Dr. [name] Royalty, but has had to  stop going to see him because of lack of health insurance.  She does report  some nausea and syncope related to pain.  Says she tried Tylenol and Advil  which do not seem to be helping.   PAST MEDICAL HISTORY:  1. Hypothyroidism.  2. Hypoglycemia.  3. Deafness.   PAST SURGICAL HISTORY:  She has had a thyroid nodule removed.   GYN HISTORY:  As outlined in the HPI.  No history of STDs.  She does report  a  history of abnormal Pap that required no treatment.  She denies  colposcopy, cryo, LEEP, loop, laser, or conization of her cervix.   PAST OBSTETRICAL HISTORY:  She is a G3, P2, AB1 with termination.  She had  an SVD on her first baby and an urgent cesarean section secondary to  prolapsed cord on her second delivery.   REVIEW OF SYSTEMS:  Temperature 102.5.  Positive nausea.  Positive syncope.  There is no chest pain, shortness of breath, upper abdominal pain,  constipation, or diarrhea, swelling in the limbs or joints.   PHYSICAL EXAMINATION:  VITAL SIGNS:  Weight 158 pounds, blood pressure  104/68, pulse 85.  GENERAL:  She is awake,  alert, in no acute distress.  LUNGS:  Clear bilaterally.  CARDIOVASCULAR:  Regular rate and rhythm.  No murmur, rub, or gallop.  ABDOMEN:  Soft, nontender, nondistended.  GENITOURINARY:  She has normal external female genitalia.  Vagina is  rugated.  The cervix is parous, visualized easily.  Moderate amount of blood  is seen in the vault.  A Pap smear is obtained.  The uterus is anteverted  and approximately 10 cm.  There is some fairly significant cervical motion  tenderness.  The right side of the pelvis seems to be fairly nontender.  The  left side of the pelvis is exquisitely tender.   IMPRESSION:  1. Pelvic pain.  2. Bacterial vaginosis.  3. Abnormal uterine bleeding.   PLAN:  1. Discussed with this patient and will update her Pap smear today.  Trial     of Naprosyn 500 mg p.o. b.i.d. with food as well as Flagyl 500 mg p.o.     b.i.d. with food secondary to bacterial vaginosis in hopes that treating     these two things will help.  Did discuss prevention of bacterial     vaginosis precautions including no douching, no sit down baths, no     wearing of pantyhose and noncotton underwear.  Discussed tampons.  The     patient will try these maneuvers.  2. Should the nonsteroidals as well as treatment of her bacterial vaginosis     fail to improve  her pain, discussed with this patient possibility of     hysterectomy.  She does not seem like she really wants to have this done,     but would be willing if this was the only way.  She seems to understand     the limitations of hysterectomy and surgery as treatment for pelvic pain     as severe as hers.  We did talk about nerve feedback loops and other     reasons why people have persistent pain despite hysterectomy and full     pelvic clean-out.  She will think about these and try the medications and     return to see me in four weeks.                                               Tinnie Gens, M.D.    TP/MEDQ  D:  03/18/2003  T:  03/19/2003  Job:  [id number]

## 2011-02-02 NOTE — H&P (Signed)
[facility]  Patient:    Donna Davis                          MRN: [id number] Adm. Date:  [id number] Attending:  Wandalee Ferdinand CC:         Rudy Jew. [name] Royalty, M.D.   History and Physical  HISTORY:                      This is a 40 year old gravida 3, para 2, AB 1, who presented on April 03, 2000, complaining of lower abdominal/pelvic pain since March 2001, mostly on the left side.  It got worse in [place].  It is made slightly better by Darvocet.  She does not want to use anything stronger because she has had a history of some narcotic addiction in the past.  The patient had an ultrasound one on April 12, 2000, which revealed a 4.8 cm complex cyst in the right adnexa. She is status post LSO.  She is hence for diagnostic/operative laparoscopy and indicated procedures.  CURRENT MEDICATIONS:          1. Darvocet.                               2. Diazepam.  PAST MEDICAL HISTORY:         Galactorrhea.  PAST SURGICAL HISTORY:        1. Laparoscopy in 2000.                               2. Laparoscopy in 1999.                               3. C-section in 1994.                               4. Laparoscopic bilateral tubal sterilization                                  procedure.  ALLERGIES:                    SULFA, HYDROCODONE.  SOCIAL HISTORY:               The patient smokes 1/2 to 1 pack of cigarettes per day.  She denies significant alcohol use.  REVIEW OF SYSTEMS:            Noncontributory.  PHYSICAL EXAMINATION:  GENERAL:                      A well-developed, well-nourished pleasant white female, in no acute distress.  VITAL SIGNS:                  Afebrile, vital signs stable.  SKIN:                         Warm and dry, without lesions.  NODES:                        There is no supraclavicular, cervical, or inguinal adenopathy.  HEENT:                        Normocephalic.  NECK:                         Supple  without thyromegaly.  CHEST:                        Lungs are clear.  CARDIAC:                      A regular rate and rhythm, without murmurs, gallops, or rubs.  BREASTS:                      Examination deferred.  ABDOMEN:                      Soft, nontender, without masses or organomegaly.  Bowel sounds are active.  PELVIC:                       Deferred until examination under anesthesia.  IMPRESSION:                   1. Right ovarian cyst.                               2. Lower abdominal/pelvic pain.                               3. History of hyperprolactinemia.                               4. Oligomenorrhea.                               5. Smoker.                               6. Status post laparoscopy and bilateral                                  tubal sterilization procedure.                               7. Status post left salpingo-oophorectomy                                  and partial right salpingectomy.   DIFFERENTIAL:                 1. Includes adhesions.                               2. Endometriosis.                               3. Primary gastrointestinal.  PLAN:  Diagnostic/operative laparoscopy with right ovarian cystectomy and indicated procedures.  The risks, benefits, complications, and alternatives are fully discussed with the patient.  She states she understands nd accepts.  Questions are invited and answered. DD:  04/17/00 TD:  04/17/00 Job: [id number]

## 2011-02-02 NOTE — Discharge Summary (Signed)
NAMESHAMARI, Davis                  ACCOUNT NO.:  [id number]   MEDICAL RECORD NO.:  [id number]          PATIENT TYPE:  INP   LOCATION:  1610                         FACILITY:  [facility]   PHYSICIAN:  Malcolm T. Russella Dar, MD, FACGDATE OF BIRTH:  Jan 22, 1971   DATE OF ADMISSION:  11/12/2006  DATE OF DISCHARGE:  11/17/2006                               DISCHARGE SUMMARY   ADMITTING DIAGNOSES:  35. A 40 year old white female with severe right upper quadrant pain      radiating into the back times five days associated with low-grade      fever, mild nausea, etiology not clear.  Rule out possible      ureterolithiasis, nonerupted herpes zoster, or other intra-      abdominal inflammatory process, i.e., peptic ulcer disease.  Also      must rule out a neuropathic radicular pain.  2. History of endometriosis.  3. Hypothyroidism.   DISCHARGE DIAGNOSES:  69. A 40 year old white female with severe right abdominal pain      radiating into the back symptomatically improved with etiology not      clear with negative workup during this admission.  2. History of endometriosis.  Would consider the possibility of      endometriosis causing her pain, though no evidence of endometrioma,      etcetera on CT scan.  3. Other diagnoses as listed above.   CONSULTATIONS:  None.   PROCEDURES:  1. CT scan of the abdomen and pelvis.  2. Upper endoscopy.  3. MRI of the thoracic and lumbar spine.  4. Abdominal ultrasound.  5. CCK HIDA.   BRIEF HISTORY:  Donna Davis is a pleasant, generally healthy, 40 year old  white female known to Dr. Jarold Motto.  She is known to the [facility]  outpatient clinic.  She has history of hypothyroidism, IBS, and has a  hearing impairment.  She has also been given the diagnosis of  endometriosis and had a laparoscopy within the past several months.   At this time she complains of a severe right upper quadrant abdominal  pain which radiates around into her back.  She had onset on February 22;  and since that time her pain is described as constant, stabbing in  nature, and exacerbated by eating and with movement.  She feels that it  is worse with sitting up for any period of time.  She has had some low-  grade temperatures at home, but nausea, no vomiting, no diarrhea,  etcetera.  She has not been short of breath; denies any cough, sputum  production, etcetera.  She has not had any dysuria, urgency, frequency,  or hematuria.  She was seen in the emergency room on February 23; at  that time by the ER physician, and had an abdominal ultrasound which was  negative, negative labs; and was discharged to home.  She had seen Dr.  Jarold Motto in the office had a CCK HIDA scan ordered STAT which was also  was negative and with her continued severe pain was admitted for pain  control and further diagnostic evaluation.   LABORATORY STUDIES:  On 02/26 WBC of 8.7, hemoglobin 14.8, hematocrit of  42.5, platelets 269.  Sed rate of 20.  Electrolytes within normal  limits, BUN 2, creatinine 0.59, albumin was 3.1.  Liver function studies  normal.  Lipase of 28.  UA negative.  Urine culture showed contamination  with multiple bacterial morpho-types present.  Helicobacter pylori was  negative.   X-RAY STUDIES:  CCK HIDA scan was normal with gallbladder ejection  fraction of 52%.  Chest x-ray was negative.  CT scan of the abdomen and  pelvis was negative.  Patient status post hysterectomy.  No acute  findings noted.  MRI of the thoracic and lumbar spine showed some  spondylosis C5-C6 some mild disk bulging eccentric at L3-L4 with mild  left foraminal narrowing with the right foramen remaining open; and a  mild disk bulge at L4-L5 without central canal or foraminal stenosis,  and a shallow disk protrusion L5-S1 without central or foraminal  narrowing.   HOSPITAL COURSE:  The patient was admitted to the service of Dr. Lina Sar who was covering the hospital with a negative CCK HIDA scan.  She   was placed on IV fluids, given narcotics, and antibiotics to help  control her pain; then underwent CT scan of the abdomen and pelvis for  further workup.  This was also a negative study, 24 hours later, the  patient was still complaining of severe pain; and an MRI of the lower  thoracic and upper lumbar spine was ordered to rule out a radicular type  of pain.  We also considered the possibility of an nonerupted shingles.  The MRI did not show any findings to account for her right-sided pain.  Upper endoscopy was then done per Dr. Juanda Chance on the 29th which was a  normal exam.  We tried to gradually advance her diet.  She continued to  complain of pain; and also had some complaints of diarrhea.  Decision  was made to repeat her colonoscopy; and this was done by Dr. Russella Dar on  03/01.  This was normal.  It was felt that some of her pain may be  related to spasm and IBS.  The patient also has a history of  endometriosis; and had asked whether if she could have endometriosis  accounting for her pain; again, there were no findings on CT scan, but  she was advised to followup with gynecologist on an outpatient basis for  further of workup.   By 03/02, she was able to keep down p.o.'s; and her pain was fairly well-  controlled with oral pain medication.  She was allowed discharge to home  with instructions to followup with Dr. Jarold Motto in the office in  approximately 2 weeks, to follow up with her gynecologist in the  interim.   DIET:  Bland.   MEDICATIONS:  1. Zofran 4 mg q.6-8 h. as needed for nausea.  2. Vicodin 5/500 q.4-6 h. as need for pain.   She was to use the heating pad on her right side and other medicines as  previous which included:  1. Levothyroxine 0.25 daily.  2. MiraLax 17 grams p.r.n.      Amy Esterwood, PA-C      Malcolm T. Russella Dar, MD, [facility]  Electronically Signed   AE/MEDQ  D:  11/19/2006  T:  11/19/2006  Job:  [id number]

## 2011-02-02 NOTE — Discharge Summary (Signed)
NAME:  Donna Davis, Donna Davis                            ACCOUNT NO.:  [id number]   MEDICAL RECORD NO.:  [id number]                   PATIENT TYPE:  INP   LOCATION:  9318                                 FACILITY:  [facility]   PHYSICIAN:  Phil D. Okey Dupre, M.D.                  DATE OF BIRTH:  03-05-1971   DATE OF ADMISSION:  02/04/2004  DATE OF DISCHARGE:  02/07/2004                                 DISCHARGE SUMMARY   HOSPITAL COURSE:  This is a patient who is a 40 year old gravida 3 para 2-0-  1-2 who 3 weeks ago underwent total abdominal hysterectomy for chronic  pelvic pain.  The patient has an extraordinarily low pain threshold and has  had a long, long history of spastic irritable colon.  She came in because of  feeling weak with incisional pain, a fever of 101, although she did run a  low-grade fever during her admission.  No significant etiology for the fever  was noted and this patient has continued to run low-grade fevers.  A  sonogram did not show any sign of pelvic abscess.  Incision was clean.  She  had a slight bloody discharge normal for this stage post hysterectomy.  Her  white count was 10,000 with a normal differential and the main complaint  during her stay, I think, was constipation.  She was placed by the admitting  physician on triple antibiotics which were discontinued on the day of  discharge.  She was discharged on Darvocet for pain as she has had a past  history of drug abuse when she was placed on codeine and Percocet following  previous surgeries and history of pain in the past.  She will be followed up  in 1 week in the GYN clinic.   DISCHARGE DIAGNOSES:  1. Satisfactory progress post hysterectomy.  2. Low-grade fever of unknown etiology.                                               Phil D. Okey Dupre, M.D.    PDR/MEDQ  D:  02/28/2004  T:  02/28/2004  Job:  [id number]

## 2011-02-02 NOTE — Discharge Summary (Signed)
Donna Davis, Donna Davis                  ACCOUNT NO.:  [id number]   MEDICAL RECORD NO.:  [id number]          PATIENT TYPE:  INP   LOCATION:  5738                         FACILITY:  [facility]   PHYSICIAN:  Duncan Dull, M.D.     DATE OF BIRTH:  1971/03/18   DATE OF ADMISSION:  12/08/2004  DATE OF DISCHARGE:  12/11/2004                                 DISCHARGE SUMMARY   INTERN:  Dennis Bast, M.D.   DISCHARGE DIAGNOSES:  1.  Abdominal pain      1.  Viral gastroenteritis.  2.  Hypothyroidism.  3.  Gastroesophageal reflux disease.   DISCHARGE MEDICATIONS:  1.  Synthroid 25 mcg p.o. daily.  2.  Prilosec over-the-counter one tab p.o. daily.   DISPOSITION:  The patient is to go home on a regular diet, and also with a  hospital followup at the outpatient clinic on Juanice 6, 2006, at 9 a.m., to  follow on her abdominal pain.  No need for labs to be drawn.  Also blood  cultures and stool cultures to be followed up.   PROCEDURE:  1.  She had a chest x-ray on December 08, 2004:  Impression of no acute      abnormalities.  2.  An abdominal x-ray on December 08, 2004:  Impression of single nonspecific      slightly prominent bowel loop in the left upper quadrant.  3.  A CT of the abdomen and pelvis on December 09, 2004:  Impression of the      abdomen CT revealed a normal CT of the abdomen.  No acute process seen.      An impression of the CT of the abdomen was no acute findings in the      pelvis.   HISTORY OF PRESENT ILLNESS:  This 40 year old white female with a past  medical history significant for gastroesophageal reflux disease,  hypothyroidism and a history of abdominal pain for several years, for which  she has had two laparotomies plus hysterectomy in [place] 2005, irritable  bowel syndrome.  She came to the clinic complaining of abdominal pain for  four days prior to admission.  Four days prior to admission was seen at  Urgent Care and was told it was acid reflux.  Again seen in the emergency  room on the day prior to admission and sent home.  She continued to have  abdominal pain, so for that reason she called the clinic.  She had one  episode of fever at 101.5 degrees four days prior to admission.  The pain is  located in the center of the abdomen and along the left side.  No problem  with food.  She took an enema on Wednesday with good results, but continued  to have pain cramping in nature.  No diarrhea, no vomiting.  A little bit of  nausea.  The patient did not eat anything different before the pain started.  She denies cough, shortness of breath, constipation, diarrhea, or sick  contacts.   PHYSICAL EXAMINATION:  VITAL SIGNS:  Temperature 99.3 degrees, pulse 76,  blood pressure 115/65, respirations 16, oxygen saturation 97% on room air.  ABDOMEN:  Soft, with good bowel sounds and with tenderness in the right  upper quadrant, epigastrium and left side of the abdomen.   LABORATORY DATA:  Sodium 141, potassium 4.2, chloride 110, bicarbonate 25,  BUN 1, creatinine 0.7, glucose 75.  Anion gap 6, bilirubin 0.6, alkaline  phosphatase 28, AST 16, ALT 13, protein 6.5, albumin 3.8, calcium 9.1.  CBC:  White blood cell count 7, hemoglobin 13.7, hematocrit 39.6, ANC 3.7, MCV  92.4, platelets 231.  Lipase 25, UDS negative.   HOSPITAL COURSE:  #1 - ABDOMINAL PAIN, VIRAL GASTROENTERITIS:  The patient  was treated initially with n.p.o. and IV fluids.  When the pain started to  subside, she was started on a clear diet and advanced as tolerated.  After  24 hours of admission the patient presented with some diarrhea that  continued for about 24-36 hours.  The patient stated that she was having  diarrhea.  The pain started to ease, and at the moment of her discharge  today, she did not have any pain and she was eating completely normal.  She  had the last bowel movement last night, and this was normal.  As I already  said, a CT of the abdomen and pelvis was normal.  Also a C. difficile  toxin  or Clostridium difficile toxin on the stool was negative.  Stool cultures  were done but are pending.  A urine culture is still pending.  The  urinalysis was normal.  White blood cells in the stool also was negative.  Because of this improvement, and because the patient is almost asymptomatic  and with an abdominal exam showing just a slight tenderness in the lower  abdomen, it was decided to send this patient home on a regular diet and with  her regular medications.  She is to come back in one week to the outpatient  clinic to follow up on her abdominal pain.   #2 - HYPOTHYROIDISM:  She was continued on her home medications.   #3 - GASTROESOPHAGEAL REFLUX DISEASE:  She was treated during this  hospitalization with Protonix and sent home on Prilosec OTC that she has  been using and having good results with that.   DISCHARGE LABORATORY DATA:  Sodium 141, potassium 3.8, chloride 111, bicarb  27, glucose 88, BUN 4, creatinine 0.7, calcium 8.7.  CBC:  White blood cell  count 7.3, hemoglobin 12.2, hematocrit 36.4, platelet count 234.      YC/MEDQ  D:  12/11/2004  T:  12/11/2004  Job:  [id number]   cc:   Outpatient Clinic

## 2011-02-02 NOTE — H&P (Signed)
NAME:  Donna Davis, Donna Davis                            ACCOUNT NO.:  [id number]   MEDICAL RECORD NO.:  [id number]                   PATIENT TYPE:  INP   LOCATION:  NA                                   [facility]   PHYSICIAN:  Phil D. Okey Dupre, M.D.                  DATE OF BIRTH:  28-Feb-1971   DATE OF ADMISSION:  01/13/2004  DATE OF DISCHARGE:                                HISTORY & PHYSICAL   CHIEF COMPLAINT:  Two periods a month for the past several years and severe  disabling pelvic pain, especially with a period.   HISTORY OF PRESENT ILLNESS:  The patient is a 39 year old gravida 2, para 2-  0-0-2, who has a 58- and a 57 year old child.  First delivery was a vaginal  delivery, then she had a C-section for prolapsed cord.  She previously had a  laparoscopy done for left ovarian torsion of cyst, which removed the ovary  on that side, and she has had three other laparoscopies for pelvic scar  tissue.  She started in the last couple of years with severe dysmenorrhea  that was controlled by naproxen, but it generally increased and became  severe and disabling.  She was on oral contraceptives in attempt to control  the pain, but this did not help and at the present time neither naproxen nor  ibuprofen can control the pain.  With all her surgeries and a history of  irritable bowel syndrome, she had been placed on many different codeine-type  drugs and then had a drug abuse problem as a result of that, which she has  controlled completely by now for the past seven years and wants as little  narcotics as possible.  The plan was to do a total vaginal hysterectomy with  the possibility of a total abdominal hysterectomy in this patient but after  reviewing her records and in consultation with her today, which is Donna Davis 23,  we have decided to go with an abdominal hysterectomy because of the severe  history of massive pelvic adhesions.   ALLERGIES:  PREDNISONE and SULFA.   MEDICATIONS:  She had  previously been on medication for irritable bowel  syndrome, is now taking nothing.  She is hypothyroid and is on Levoxyl 25  mcg a day, and she takes Protonix 40 mg p.r.n. for GERD.  She occasionally  takes Darvocet but even likes to avoid that because of her past history of  drug dependency.   PAST MEDICAL HISTORY:  Surgically, a left salpingo-oophorectomy for torsion  ovarian cyst, three subsequent laparoscopies for pelvic adhesions, and one  cesarean section.   FAMILY HISTORY:  Her mother had endometriosis, severe, had a total abdominal  hysterectomy with bilateral salpingo-oophorectomy and suffered greatly from  the lack of estrogen over the years.  Her father is a brittle diabetic, and  there is a strong family history of increased  blood pressure and heart  problems.  There is no history of pulmonary emboli or bleeding problems.   REVIEW OF SYSTEMS:  Significant are her GI problems of history of GERD and  spastic colon and irritable bowel syndrome, which she said had improved  after her previous surgeries for pelvic adhesions, and currently takes no  medications for those.  Otherwise she is in good health.   PHYSICAL EXAMINATION:  VITAL SIGNS:  Height is 5 feet 1 inches, weight is  138 pounds, blood pressure 109/63, and a pulse of 92.  HEENT:  Within normal limits.  CHEST:  The lungs were clear to auscultation and percussion.  NECK:  Supple with normal thyroid palpable and symmetrical.  CARDIAC:  No murmur, normal sinus rhythm.  BREASTS:  Symmetrical with no dominant masses.  ABDOMEN:  Soft, flat, and nontender.  No masses or organomegaly.  PELVIC:  The genitalia external was normal.  Introitus marital.  Vagina is  clean and well-rugated.  Cervix is clean and parous.  The uterus is well-  mobilized and normal size, shape, and consistency, small fibroid in the area  of the right broad ligament.  The adnexa could not be well-palpated.  The  cul-de-sac was free.  EXTREMITIES:   Normal with no edema, no varices.  SKIN:  Normal turgor and pallor.   We had a long discussion with the patient considering the procedure.  We  would like to maintain an ovary if it was at all feasible.  We will do an  abdominal hysterectomy, which is a change from what our previous plan was,  because of the history of severe endometriosis in the mother and the pelvic  adhesions that she has had over the past that have been operated on by Dr.  Gracelyn Nurse, Dr. [name] Royalty, and Dr. Burnett Kanaris.  We discussed problems that occur because  of previous scarring, the high risk to urinary tract or bowel, and the  patient is scheduled for total abdominal hysterectomy.  The diagnosis is  chronic pelvic pain and chronic dysfunctional uterine bleeding.                                               Phil D. Okey Dupre, M.D.    PDR/MEDQ  D:  01/10/2004  T:  01/10/2004  Job:  [id number]

## 2011-02-02 NOTE — Op Note (Signed)
Donna Davis, Donna Davis                  ACCOUNT NO.:  [id number]   MEDICAL RECORD NO.:  [id number]          PATIENT TYPE:  AMB   LOCATION:                                [facility]   PHYSICIAN:  Phil D. Okey Dupre, M.D.     DATE OF BIRTH:  09/11/1971   DATE OF PROCEDURE:  01/14/2006  DATE OF DISCHARGE:  01/14/2006                                 OPERATIVE REPORT   PROCEDURE:  Diagnostic laparoscopy.   PREOPERATIVE DIAGNOSIS:  Chronic right sided probable adnexal pain.   POSTOPERATIVE DIAGNOSIS:  Chronic right sided probable adnexal pain pus  minimal endometriosis and pelvic adhesions.   SURGEON:  Dr. Okey Dupre.   ANESTHESIA:  General.   ESTIMATED BLOOD LOSS:  Minimal.   PATHOLOGY SPECIMEN:  Zero.   POSTOPERATIVE CONDITION:  Satisfactory.   OPERATIVE FINDINGS:  The pelvis had multiple omental to bowel and bowel to  omental  to the pelvic wall on the left side especially with a few filmy  adhesions on the right.  The ovary, however, once visualized was found to be  freely movable not tied down by any adhesions and normal in appearance.  I  could not justify removing this ovary in this young woman.  We also found  superficial endometriotic implants on the anterior abdominal wall on the  right side just outside of the pelvic area.  This may explain some of the  pain the patient has expressed so were going to treat her with Depo-Lupron  primarily. The procedure went as follows.   Under satisfactory general anesthesia the patient in dorsolithotomy position  the perineum, vagina and abdomen were prepped and draped in usual sterile  manner.  Foley catheter was placed in the patient's bladder.  A 1 cm  transverse incision made just below the umbilicus.  Veress needle inserted  in the peritoneal cavity, approximately 2 L carbon dioxide slowly  insufflated into the peritoneal cavity.  This was removed and the  laparoscopic trocar inserted into the peritoneal cavity.  The trocar removed  from  the sleeve, the laparoscope inserted and using a long probe and  grasping forceps we were able to identify the ovary on the right side which  was very mobile with no sign of adhesions.  There were adhesions minimal  amount on the right but mainly on the left in the left pelvis where the  patient was not at the present time complaining of pain. The only  possibility for gynecological cause of the pain that I could see was some  small peritoneal implants of endometriosis on the anterior abdominal wall  just outside the pelvis extending just up around above the ovary but the  ovary had no sign of endometrial implants on that.  It was decided that we  will try her on Depo-Lupron prior to doing any further surgery.  Scope and  CO2 as much as possible was removed from the  abdominal cavity.  The incision closed with 3-0 Vicryl suture and an  atraumatic needle being sure we closed the fascia deep down and then  subcutaneous tissue.  A dry sterile dressing was applied.  Foley catheter  was removed.  The patient transferred recovery room in satisfactory  condition.           ______________________________  Javier Glazier Okey Dupre, M.D.     PDR/MEDQ  D:  01/14/2006  T:  01/15/2006  Job:  [id number]

## 2011-02-02 NOTE — Op Note (Signed)
Donna Davis, Donna Davis                  ACCOUNT NO.:  [id number]   MEDICAL RECORD NO.:  [id number]          PATIENT TYPE:  WOC   LOCATION:  WOC                          FACILITY:  [facility]   PHYSICIAN:  Christopher E. Ezzard Standing, M.D.DATE OF BIRTH:  Feb 04, 1971   DATE OF PROCEDURE:  05/14/2006  DATE OF DISCHARGE:                                 OPERATIVE REPORT   PREOPERATIVE DIAGNOSIS:  Left ear mixed hearing loss with 20 to 30 decibel  conductive loss.   POSTOPERATIVE DIAGNOSIS:  Left ear mixed hearing loss with 20 to 30 decibel  conductive loss.  Findings consistent with otosclerosis.   OPERATION:  Stapedectomy (Gyrus piston prosthesis 4.25 mm length and 0.5 mm  in diameter.).   SURGEON:  Kristine Garbe. Ezzard Standing, M.D.   ANESTHESIA:  General endotracheal.   COMPLICATIONS:  None.   BRIEF CLINICAL NOTE:  The patient is a 41 year old female who has had  longstanding hearing loss.  Her hearing over the last couple of years has  progressively gotten worse.  Her most recent hearing test demonstrated  approximately 30 decibel conductive loss in addition to a moderate severe  downsloping sensory neural hearing loss.  Hearing was symmetric in both ears  with SRTs around 90 to 95.  She wears bilateral hearing aides.  She was  interested in having surgery to see if she can improve her hearing at all.  I discussed with her extensively concerning the risks of surgery being no  improvement of her hearing or worsening of her hearing as well as possible  dizziness.  She understood the risks and complications and wished to proceed  with exploratory tympanotomy and possible stapedectomy.  Of note, apparently  her sensory neural hearing loss was secondary to measles her mother had  while she was pregnant.   DESCRIPTION OF THE PROCEDURE:  After adequate endotracheal anesthesia, the  patient was __________.  The ear canal was then injected with Xylocaine with  epinephrine for hemostasis.  A posterior based  tympanomeatal flap was  elevated onto the annulus.  Cotton pledgets soaked with adrenaline were  placed for hemostasis.  The annulus was then elevated.  The middle ear space  was entered.  The middle ear space was clear.  There was no disease process.  On examination, of the ossicles, posteriorly to superiorly, the stapes bone  had poor mobility.  The measurement from the incus to the foot plate was  approximately or just less than 4.5 mm.  It was elected to perform a 4.25  piston prosthesis.  The stapedial tendon was transected.  The superstructure  was then fractured toward the promontory.  On fracturing the superstructure  toward the promontory, the majority of the foot plate came up with the  superstructure.  A small incision was made behind the ear, and a small, thin  fascial graft was harvested.  The fascial graft was placed over the oval  window.  The prosthesis 4.25 piston prosthesis was then placed on the  fascial graft over the oval window, and the prosthesis was crimped to the  incus utilizing heat as  this was a gyrus self-crimping prosthesis.  Photos  were obtained.  Additional Gelfoam was placed around the prosthesis and over  the fascial graft.  The tympanomeatal flap was brought back down.  The ear  canal was packed with a couple of pieces of Gelfoam soaked in Ciprodex.  Of  note, the Gelfoam in the middle ear was soaked in Ciprodex.  The remaining  ear canal was then filled with bacitracin ointment.  Postauricular incision  was closed with 5-0 nylon sutures.  A cotton ball was placed in the ear and  a mastoid dressing was placed over the left ear.  The patient was awoke from  anesthesia and transferred to the recovery room and postoperatively doing  well.   DISPOSITION:  The patient will be observed overnight in the [facility] and discharged home in the morning on Keflex 500 mg b.i.d. for 5 days  and Tylenol and Vicodin p.r.n. __________ 25 mg q.4 h. p.r.n.  nausea.  Will  have her follow up in my office in 1 week for a recheck and have  postauricular sutures removed.           ______________________________  Kristine Garbe Ezzard Standing, M.D.     CEN/MEDQ  D:  05/14/2006  T:  05/14/2006  Job:  [id number]

## 2011-02-02 NOTE — Group Therapy Note (Signed)
NAMECASHLYN, [name]:  [id number]   MEDICAL RECORD NO.:  [id number]          PATIENT TYPE:  WOC   LOCATION:  [facility]                   FACILITY:  [facility]   PHYSICIAN:  Argentina Donovan, MD        DATE OF BIRTH:  Oct 12, 1970   DATE OF SERVICE:  07/20/2004                                    CLINIC NOTE   REASON FOR VISIT:  This patient is a 40 year old white female who had a  total abdominal hysterectomy in [place] 2005.  Since that time, she has had  multiple visits because of vaginal discharge, but mainly not feeling well.  In general, she feels extremely fatigued, it is a chore for her to take a  shower.  She denies headaches, stiff neck, cough productive or non, heart  palpitations, abdominal pain, groin or joint pain, nor back pain.   PHYSICAL EXAMINATION:  HEENT:  Within normal limits.  NECK:  Supple with no masses.  Thyroid is symmetrical with no dominant mass.  LUNGS:  Clear to auscultation and percussion.  HEART:  Normal sinus rhythm with no murmurs.  ABDOMEN:  Soft, flat, nontender.  No masses, no organomegaly.  There is no  palpable lymphadenopathy, there is no CVA tenderness.  PELVIC:  External genitalia is normal, BUS within normal limits.  The vagina  is clean with a heavy, thick, creamy leukorrhea, status hysterectomy.  Bimanual pelvic examination:  Both ovaries are palpable and of normal size,  shape, consistency, and nontender.  RECTAL:  Confirmatory.  EXTREMITIES:  Normal.  DTRs are within normal limits.  There is no sign of  any edema.   I am at a loss for this young lady with a fever of unknown origin.  I am  going to order a chest x-ray on her, a hepatitis screen, a CBC and  sedimentation rate, and I want her to be referred to the medical clinic for  evaluation of fever of unknown origin.  In addition to this, wet prep of the  vagina has been done.      PR/MEDQ  D:  07/20/2004  T:  07/20/2004  Job:  [id number]

## 2011-02-02 NOTE — Group Therapy Note (Signed)
NAME:  [name], Donna Davis                            ACCOUNT NO.:  [id number]   MEDICAL RECORD NO.:  [id number]                   PATIENT TYPE:  OUT   LOCATION:  [facility]                           FACILITY:  [facility]   PHYSICIAN:  Argentina Donovan, MD                     DATE OF BIRTH:  June 24, 1971   DATE OF SERVICE:  12/09/2003                                    CLINIC NOTE   REASON FOR VISIT:  The patient is a 40 year old gravida 2 para 2-0-0-2 who  had one vaginal delivery and then one cesarean section for a prolapsed cord.  She had previously had a laparoscopy done for a left ovarian cyst and says  that the ovary was removed on that side.  Over the past couple of years she  has had severe dysmenorrhea that in the beginning was controlled by naproxen  but has gotten increasingly more severe and has become disabling.  She has  been placed on oral contraceptives in an attempt to control the pain but  this did not help and at the present time neither the naproxen nor ibuprofen  can control the pain.  The patient has a past history of some drug abuse so  she stays away from narcotics completely.  She has been clean, she says, for  7 years.  She is allergic to PREDNISONE and SULFA.   MEDICAL HISTORY:  She has had a left salpingo-oophorectomy for ovarian cyst  and she takes medication for irritable bowel syndrome.  She is also  hypothyroid and is on Levoxyl. 25 mcg a day and she takes 40 of Protonix for  GERD.  Occasionally she can take Darvocet but she tries to avoid even that  because of her past history of drug dependency.   PHYSICAL EXAMINATION:  The abdomen was soft, flat, nontender.  No masses, no  organomegaly.  The external genitalia is normal.  The vagina is clean and  well rugated.  The cervix is clean and parous.  The uterus is well mobilized  and of normal size, shape, and consistency with a small fibroid in the area  of the right broad ligament.  The adnexa could not be well palpated.   The  cul-de-sac was free.   We have discussed the options for treatment with this patient and she has  decided that she would like to try hysterectomy.  She wants to preserve her  ovaries because her mother had a history of endometriosis and suffered  greatly when they took out both of her ovaries.  The patient will be  scheduled for vaginal hysterectomy, possible abdominal hysterectomy.  We  have discussed with her the problems that could occur because of the  previous scarring from previous surgery.  Also, that she is at high risk for  injury to the urinary tract of bowel, and we discussed other possible  complications from her procedure.  The patient will be seen within a week  prior to the surgery.                                               Argentina Donovan, MD    PR/MEDQ  D:  12/09/2003  T:  12/09/2003  Job:  [id number]

## 2011-02-02 NOTE — Group Therapy Note (Signed)
Donna Davis, POLIDORE NO.:  [id number]   MEDICAL RECORD NO.:  [id number]          PATIENT TYPE:  WOC   LOCATION:  [facility]                   FACILITY:  [facility]   PHYSICIAN:  Argentina Donovan, MD        DATE OF BIRTH:  08-28-1971   DATE OF SERVICE:  01/23/2006                                    CLINIC NOTE   The patient is a 40 year old white female that underwent laparoscopy for  chronic pelvic pain on [name] 30, 2007.  She has had a previous total  abdominal hysterectomy and left salpingo-oophorectomy.  The right ovary  appeared to be completely normal; however, there were signs of mild  endometriosis in the pelvis for which the patient was given 11.25 Depot  Lupron which I hope to repeat after 3 months.  In addition to this, she is  scheduled for a colonoscopy by Dr. Sheryn Bison, as she has severe  spastic colon and has been overdoing her laxatives.  She seems to be having  some significant side effects from the Depot Lupron as well as those  probably not related, sleep difficulties, bad dreams, hot flashes which I  can probably attribute to the Depot Lupron; however, the anxiety, the  crying, the depression, I think it may not be related.  I told her we would  try and control her sleep problems using __________8 mg, and we will  continue on the hydrocodone.  She may eventually need a pain clinic if this  continues or at least a removal of the other ovary.  She does not seem any  better since the surgery; however, it has really not been much time for the  Depot Lupron to take effect.  If she does improve with that, I would keep  her on it for a full 6 months.   IMPRESSION:  Chronic right lower quadrant pelvic pain, dyspareunia.           ______________________________  Argentina Donovan, MD     PR/MEDQ  D:  01/23/2006  T:  01/24/2006  Job:  [id number]

## 2011-02-02 NOTE — Group Therapy Note (Signed)
NAMECARIAH, Donna [name]:  [id number]   MEDICAL RECORD NO.:  [id number]          PATIENT TYPE:  WOC   LOCATION:  [facility]                   [facility]ACILITY:  [facility]   PHYSICIAN:  Argentina Donovan, MD        DATE OF BIRTH:  07/09/1971   DATE OF SERVICE:  06/08/2004                                    CLINIC NOTE   REASON FOR VISIT:  The patient is a 40 year old white female status post  total abdominal hysterectomy which was done in [place] 2005.  She went into  the MAU because of low back and white vaginal discharge about a week ago.  A  wet prep did not show any significant findings and she was treated  empirically with metronidazole.  The back pain persisted, as well as the  discharge.  Today, the examination of the abdomen was soft, flat, nontender;  no masses, no organomegaly.  External genitalia was normal, BUS within  normal limits.  The vagina is clean with a heavy leukorrhea, clear, nonfoul,  and normal healed vaginal cuff.  Bimanual pelvic examination revealed status  hysterectomy with approximately a 4 cm right ovarian cyst, quite tender, and  plastered against the right pelvic wall.  Wet prep has been taken.   IMPRESSION:  1.  Leukorrhea, probable normal.  2.  Right ovarian cyst with low back pain.   I have instructed the patient to sleep with a pillow between her thighs, to  do back exercises, and we are going to get an ultrasound of the pelvis to  evaluate the cyst and await the wet prep report.      PR/MEDQ  D:  06/08/2004  T:  06/09/2004  Job:  [id number]

## 2011-02-02 NOTE — Discharge Summary (Signed)
NAME:  Donna Davis, SHANKMAN                            ACCOUNT NO.:  [id number]   MEDICAL RECORD NO.:  [id number]                   PATIENT TYPE:  INP   LOCATION:  9309                                 [facility]   PHYSICIAN:  Phil D. Okey Dupre, M.D.                  DATE OF BIRTH:  1971/04/22   DATE OF ADMISSION:  01/13/2004  DATE OF DISCHARGE:                                 DISCHARGE SUMMARY   HISTORY:  The patient is a 40 year old gravida 2, para 2-0-0-2 who had a  history of previous cesarean section and a laparoscopic removal of a portion  of an ovarian cyst, as well as three laparoscopic examinations for pelvic  adhesions by three different practitioners.  She underwent exploratory  laparotomy and total abdominal hysterectomy on the day of admission.   HOSPITAL COURSE:  The patient was admitted because of complaints of severe  disabling dysmenorrhea and chronic pelvic pain.  Pathology was nonrevealing,  and the patient's postoperative course has been difficult since she has been  addicted to codeine in the past.  We tried to keep pain controlled without  the use of much narcotics.  The patient does seem to do okay on Motrin and  Darvocet, on which she will be discharged.  There was a significant drop in  her hemoglobin from the one prior to admission, with 14.4 down to discharge  of 10.1.  So, she will also be placed on Ferro-Sequels.  On the second day  she had a low-grade fever with normal white counts; with no obvious source  of fever.   The abdomen was soft, flat and created bowel sounds.  The wound looked  clean.  The lungs are clear, but we did start on ampicillin prior to  discharge yesterday, and will keep her on that at home for one week.   I am going to see her in the GYN Clinic four days following discharge.  Skin  staples were removed and there was a slight separation of the wound, which  was closed with Steri-Strips.   DISCHARGE INSTRUCTIONS:  No physical activity,  especially heavy lifting and  stairs.  Diet to be high fiber, drink plenty of fluids especially since she  is going to be on iron therapy.   FOLLOW UP:  To be seen in the GYN Clinic in four days, and then will see her  back probably two weeks after that; then probably in one month for final  discharge visit.   ADDITIONAL INSTRUCTIONS:  I have also discussed with the patient that with  her chronic pain, if she is not helped (although her dysmenorrhea should be  greatly improved) with chronic pelvic pain, then we may have to get her to a  pain clinic as there was no significant pathology in spite of our  anticipation of significant adhesions.  We did not find this to be  so when  we entered the abdominal cavity.   DISCHARGE DIAGNOSIS:  Status satisfactory post total abdominal hysterectomy.  A close follow-up will be undertaken with this patient.                                               Phil D. Okey Dupre, M.D.    PDR/MEDQ  D:  01/16/2004  T:  01/16/2004  Job:  [id number]

## 2011-02-15 ENCOUNTER — Encounter: Payer: Self-pay | Admitting: Internal Medicine

## 2011-02-15 ENCOUNTER — Ambulatory Visit (INDEPENDENT_AMBULATORY_CARE_PROVIDER_SITE_OTHER): Payer: BC Managed Care – PPO | Admitting: Internal Medicine

## 2011-02-15 VITALS — BP 130/76 | HR 97 | Temp 100.0°F | Ht 60.0 in | Wt 174.7 lb

## 2011-02-15 DIAGNOSIS — E039 Hypothyroidism, unspecified: Secondary | ICD-10-CM

## 2011-02-15 DIAGNOSIS — F411 Generalized anxiety disorder: Secondary | ICD-10-CM

## 2011-02-15 MED ORDER — LEVOTHYROXINE SODIUM 25 MCG PO TABS: 25.0000 ug | ORAL_TABLET | Freq: Every day | ORAL | Status: DC

## 2011-02-15 MED ORDER — ALPRAZOLAM 0.25 MG PO TABS: 0.2500 mg | ORAL_TABLET | Freq: Two times a day (BID) | ORAL | Status: DC | PRN

## 2011-02-15 NOTE — Progress Notes (Signed)
Subjective:    Patient ID: Donna Davis, female    DOB: 1970/11/21, 40 y.o.   MRN: [id number]  HPI Comments: Patient states her anxiety has shot through the roof...usually goes into deep depression with these kinds of stress levels but she feels more energized.  Over the last month her anxiety level has been terrible.  June is 4 year anniversary of father's murder and morhter has lymphoma and she has a teenage daughter with borderline personality disorder and bipolar disorder.  Some marital problems as well (though she stresses no abuse, though this did happen 20 years ago).   A lot of stress at home and at work.  Patient describes both panic attack-type symptoms and a generalized state of anxiety. No thoughts of self-harm.   stay she has had trouble sleeping mostly because she feels so anxious.   Has spoken with her counselor recently who she has followed with for the past 20 years.  She has tried talking through some of the issues and problems, and this is helping some.    In the past she has lots of allergic reactions to medications so she is reluctant to try many medications.  Her mother suggested xanax, which works for her.  A long time ago when she first had severe depression, she tried many medications  (wellbutrin was the last thing she took, and it worked for her at that time but she got a kidney infection that they thought was caused by the wellbutrin.).  She is not sure if she has ever taken a benzodiazepine-type medication before.    Pt in the distant past abused vicodin and states she is extremely vigilant about taking vicodin.  Not currently taking any.  No history of alcohol abuse.  She states she may feel her hot flashes are getting worse with her increased anxiety however she has no other systemic complaints.   Anxiety Patient reports no chest pain, confusion, dizziness, nausea, palpitations or shortness of breath.        Review of Systems  Constitutional: Negative for fever  and chills.  HENT: Negative for congestion, sore throat and trouble swallowing.   Eyes: Negative for visual disturbance.  Respiratory: Negative for cough, chest tightness and shortness of breath.   Cardiovascular: Negative for chest pain, palpitations and leg swelling.  Gastrointestinal: Negative for nausea, vomiting, abdominal pain, diarrhea, constipation and blood in stool.  Genitourinary: Negative for dysuria, urgency, frequency, vaginal bleeding and difficulty urinating.  Musculoskeletal: Negative for myalgias and arthralgias.  Skin: Negative for rash.  Neurological: Negative for dizziness, weakness, numbness and headaches.  Hematological: Negative for adenopathy.  Psychiatric/Behavioral: Negative for confusion.  All other systems reviewed and are negative.       Objective:   Physical Exam  Constitutional: She is oriented to person, place, and time. She appears well-developed and well-nourished. No distress.  HENT:  Head: Normocephalic and atraumatic.  Mouth/Throat: No oropharyngeal exudate.  Eyes: Conjunctivae and EOM are normal. Pupils are equal, round, and reactive to light.  Neck: Normal range of motion. Neck supple.  Cardiovascular: Normal rate, regular rhythm and intact distal pulses.   No murmur heard. Pulmonary/Chest: Effort normal and breath sounds normal. She has no wheezes.  Abdominal: Soft. Bowel sounds are normal. She exhibits no distension and no mass. There is no tenderness. There is no rebound and no guarding.  Musculoskeletal: Normal range of motion. She exhibits no edema and no tenderness.  Neurological: She is alert and oriented to person, place, and time.  No cranial nerve deficit. Coordination normal.  Skin: Skin is warm and dry. No rash noted.  Psychiatric: She has a normal mood and affect. Her behavior is normal. Judgment and thought content normal.          Assessment & Plan:

## 2011-02-15 NOTE — Assessment & Plan Note (Signed)
Today we will check a TSH in light of the patient's increased anxiety. She states she needs a refill of her medications so we will refill this today and followup on her lab results in case dose adjustment is needed.

## 2011-02-15 NOTE — Assessment & Plan Note (Addendum)
Patient does appear to be experiencing a period of high anxiety. She does not describe periods of mania nor of depression. Her problems really do seem to be more anxiety related; therefore a benzodiazepine trial is appropriate. I discussed this with Dr. Josem Kaufmann. I discussed with the patient the abuse potential of the drug and how she should take it. I prescribed her 60 pills of 0.25 mg Xanax. She is to take one pill every night before going to bed and then she has enough pills to take one pill every day as needed. I cautioned her that it may make her drowsy and she should be careful and 2 she knows how she will react to the drug. She will followup with [place] in 4 weeks.

## 2011-02-15 NOTE — Patient Instructions (Signed)
You have been given a medication for anxiety called xanax.  I would like for you to take one pill a day before bed for the next month.  You have enough pills to take another pill each day as needed for anxiety.  Take care that this medication should not be taken with alcohol, nor if you need to be alert (for example, driving a car). Please return to the clinic in 4 weeks. If you at any time have thoughts of self harm, please go immediately to the ER or call the clinic.

## 2011-03-10 ENCOUNTER — Other Ambulatory Visit: Payer: Self-pay | Admitting: Internal Medicine

## 2011-03-20 ENCOUNTER — Other Ambulatory Visit: Payer: Self-pay | Admitting: *Deleted

## 2011-03-20 ENCOUNTER — Encounter: Payer: BC Managed Care – PPO | Admitting: Internal Medicine

## 2011-03-20 DIAGNOSIS — F411 Generalized anxiety disorder: Secondary | ICD-10-CM

## 2011-03-22 ENCOUNTER — Other Ambulatory Visit: Payer: Self-pay | Admitting: Orthopaedic Surgery

## 2011-03-22 DIAGNOSIS — M25572 Pain in left ankle and joints of left foot: Secondary | ICD-10-CM

## 2011-03-22 MED ORDER — ALPRAZOLAM 0.25 MG PO TABS: 0.2500 mg | ORAL_TABLET | Freq: Two times a day (BID) | ORAL | Status: DC | PRN

## 2011-03-23 NOTE — Telephone Encounter (Signed)
Refill called to the [facility].

## 2011-03-25 ENCOUNTER — Ambulatory Visit
Admission: RE | Admit: 2011-03-25 | Discharge: 2011-03-25 | Disposition: A | Discharge status: Home / Self Care | Payer: BC Managed Care – PPO | Source: Ambulatory Visit | Attending: Orthopaedic Surgery | Admitting: Orthopaedic Surgery

## 2011-03-25 DIAGNOSIS — M25572 Pain in left ankle and joints of left foot: Secondary | ICD-10-CM

## 2011-03-29 ENCOUNTER — Encounter: Payer: Self-pay | Admitting: Internal Medicine

## 2011-04-12 ENCOUNTER — Ambulatory Visit (INDEPENDENT_AMBULATORY_CARE_PROVIDER_SITE_OTHER): Payer: BC Managed Care – PPO | Admitting: Internal Medicine

## 2011-04-12 ENCOUNTER — Encounter: Payer: Self-pay | Admitting: Internal Medicine

## 2011-04-12 VITALS — BP 137/77 | HR 103 | Temp 98.5°F | Ht 60.0 in | Wt 175.4 lb

## 2011-04-12 DIAGNOSIS — R197 Diarrhea, unspecified: Secondary | ICD-10-CM | POA: Insufficient documentation

## 2011-04-12 DIAGNOSIS — J40 Bronchitis, not specified as acute or chronic: Secondary | ICD-10-CM | POA: Insufficient documentation

## 2011-04-12 MED ORDER — ALBUTEROL SULFATE HFA 108 (90 BASE) MCG/ACT IN AERS: 1.0000 | INHALATION_SPRAY | Freq: Four times a day (QID) | RESPIRATORY_TRACT | Status: DC | PRN

## 2011-04-12 NOTE — Assessment & Plan Note (Signed)
Likely in the setting of her viral illness. She is still tolerating food/ drink well.  - Encouraged to continue keeping hydrated. - Instructed to call clinic if progressive diarrhea and unable to tolerate food/drink/ getting dehydrated.

## 2011-04-12 NOTE — Patient Instructions (Addendum)
   Please follow-up at the clinic in 3-4 weeks for reevaluation of your bronchitis.  If you have been started on new medication(s), and you develop throat closing, tongue swelling, rash, please stop the medication and call the clinic at [phone] and go to the ER.  You can use albuterol as needed (look at the instructions).   If you have worsening of your symptoms including worsening cough, shortness of breath, diarrhea, unable to keep down food/ drinks --> call the clinic immediately or go to the ER.    Please bring all of your medications in a bag to your next visit.   Acute Bronchitis Bronchitis is a problem of the air tubes leading to your lungs. Acute means the illness started quickly. In this condition, the lining of those tubes becomes puffy (swollen) and can leak fluid. This makes it harder for air to get in and out of your lungs. You may cough a lot. This is because the air tubes are narrow. Bronchitis is most often caused by a virus. Medicines that kill germs (antibiotics) may be needed with germ (bacteria) infections for people who:  Smoke.   Have lasting (chronic) lung problems.   Are elderly.  HOME CARE  Rest.   Drink enough water and fluids to keep the pee clear or pale yellow.   Only take medicine as told by your doctor.   Medicines may be prescribed that will open up the airways. This will help make breathing easier.   Bronchitis usually gets better on its own in a few days.  Recovery from some problems (symptoms) of bronchitis may be slow. You should start feeling a little better after 2 to 3 days. Coughing may last for 3 to 4 weeks. GET HELP RIGHT AWAY IF:  You or your child has a temperature by mouth above 102 F, not controlled by medicine.   Chills or chest pain develops.   You or your child develops very bad shortness of breath.   There is bloody saliva mixed with mucus (sputum).   You or your child throws up (vomits) often, loses too much fluid  (dehydration), feels faint, or has a very bad headache.   You or your child does not improve after 1 week of treatment.  MAKE SURE YOU:    Understand these instructions.   Will watch this condition.   Will get help right away if you or your child is not doing well or gets worse.  Document Released: 02/20/2008 Document Re-Released: 11/28/2009 [facility] Patient Information 2011 [place], [place].

## 2011-04-12 NOTE — Assessment & Plan Note (Addendum)
Symptoms most consistent with viral bronchitis (although bacterial cannot be ruled out as so early in course). No clinical evidence of pna as pt with only nonproductive cough, only mild fevers, no crackles on exam.  - Will refill albuterol. - Pt has numerous medication allergies. I was going to prescribe tessalon perles or guafenisin-codeine, however, but both have components that she may be allergic to, per her allergy list. - She has vicodin at home for her broken foot, I told her that she can take the vicodin 1 tab 1-2 times daily if absolutely needed for only a few days over the weekend, but do not take until has first tried the albuterol regularly.  - Counseled to return to clinic if worsening or symptoms.   OF NOTE - pt requests to review her medical record and diagnoses with her PCP on Monday (at her already scheduled visit) because she is concerned that some diagnoses are not accurate. Will pass this information along to her PCP.

## 2011-04-12 NOTE — Progress Notes (Signed)
  Subjective:    Patient ID: Donna Davis, female    DOB: Mar 01, 1971, 40 y.o.   MRN: [id number]  HPI Pt is a 40 y.o. female with a PMHx of Bipolar affective; Hypothyroidism; IBS (irritable bowel syndrome); Anxiety; Hearing difficulty and who presents to clinic today for the following:    1) Cough - Pt has been having a nonproductive cough x 2 days. Also with fever to 100.8 last night, (+) chills, (+) sore throat, (-) nasal congestion, (-) itchy watery eyes. Mild right ear pain. Not sure if around any sick contacts. Previously has required albuterol for bronchitis, but has not required/ used albuterol for several months and no longer has the albuterol. Also confirms diarrhea 5 loose stools, decreased appetite, still able to drink well. No nausea, vomiting, nasal congestion, itchy eyes. Is still able to tolerate her food and drink, just with diminished appetite. No sputum, so no blood in sputum.    Review of Systems Per HPI.  Current Outpatient Medications Medication Sig  . ALPRAZolam (XANAX) 0.25 MG tablet Take 1 tablet (0.25 mg total) by mouth 2 (two) times daily as needed for anxiety.  Marland Kitchen levothyroxine (SYNTHROID, LEVOTHROID) 25 MCG tablet TAKE 1 TABLET BY MOUTH EVERY DAY  . Norethin-Eth Estrad-Fe Biphas (LO LOESTRIN FE PO) Take by mouth daily.      Allergies Onion; Biaxin; Cefuroxime; Cephalexin; Ciprofloxacin; Clarithromycin; Doxycycline; ZHY:[id number]+BMWUXLKGM+WNUUVOZDGU acid+aspartame; Prednisone; Shrimp flavor; Sulfonamide derivatives; and Tramadol hcl  Past Medical History  Diagnosis Date  . GERD (gastroesophageal reflux disease)   . Bipolar affective   . Hypothyroidism   . IBS (irritable bowel syndrome)   . Anxiety   . Hearing difficulty     b/l, 2/2 congenital rubella s/p stapedectomy. Using hearing aid  . Headache 04/2005    h/o headache in past that raised question of psuedotumor s/p therapeutic LP with an opening preassure of 18 cm H2O   . Hepatic cyst     6 mm on CT  done done in 05/2005  . Polysubstance abuse     including codiene, hydrocodone, tobacco and adderal    Past Surgical History  Procedure Date  . Abdominal hysterectomy     partial hysterecotmy 2003 for endoemtriosis with residula right ovaey. Multiple GYN surgeries for chronic pelvic pain  . Cesarean section 1994       Objective:   Physical Exam General: Vital signs reviewed and noted. Well-developed, well-nourished, in no acute distress; alert, appropriate and cooperative throughout examination.  Head: Normocephalic, atraumatic.  Ears: TM nonerythematous, not bulging, good light reflex bilaterally. Mild erythema of canal of right ear, but no inflammation.  Nose: Mucous membranes moist, not inflammed, nonerythematous.  Throat: Oropharynx with mild erythematous, no exudate appreciated.   Neck: No deformities, masses, or tenderness noted.  Lungs:  Normal respiratory effort. Diffuse occasional expiratory wheezes.  Heart: RRR. S1 and S2 normal without gallop, murmur, or rubs.  Abdomen:  BS normoactive. Soft, Nondistended, non-tender.  No masses or organomegaly.  Extremities: No pretibial edema.       Assessment & Plan:   Case and plan of care discussed with Dr. Ulyess Mort.

## 2011-04-16 ENCOUNTER — Encounter: Payer: Self-pay | Admitting: Internal Medicine

## 2011-04-16 ENCOUNTER — Ambulatory Visit (INDEPENDENT_AMBULATORY_CARE_PROVIDER_SITE_OTHER): Payer: BC Managed Care – PPO | Admitting: Internal Medicine

## 2011-04-16 VITALS — BP 124/74 | HR 81 | Temp 99.3°F | Ht 60.0 in | Wt 176.5 lb

## 2011-04-16 DIAGNOSIS — F419 Anxiety disorder, unspecified: Secondary | ICD-10-CM

## 2011-04-16 DIAGNOSIS — F411 Generalized anxiety disorder: Secondary | ICD-10-CM

## 2011-04-16 DIAGNOSIS — J40 Bronchitis, not specified as acute or chronic: Secondary | ICD-10-CM

## 2011-04-16 MED ORDER — PANTOPRAZOLE SODIUM 20 MG PO TBEC: 20.0000 mg | DELAYED_RELEASE_TABLET | Freq: Two times a day (BID) | ORAL | Status: DC

## 2011-04-16 MED ORDER — AZITHROMYCIN 250 MG PO TABS: ORAL_TABLET | ORAL | Status: AC

## 2011-04-16 NOTE — Patient Instructions (Addendum)
Follow up in 3-4 weeks If you are not feeling well in a week, reschedule an early appointment.

## 2011-04-17 NOTE — Assessment & Plan Note (Addendum)
Given the symptoms have lasted for more than 5 days and she does have fever and productive cough, there might be superadded bacterial infection. Visit treat with Z-Pak Ask her to return to the clinic 10 days if symptoms not better. She might need a chest x-ray and broader-spectrum antibiotics at that time. Also we'll prescribe Protonix once a day for possible GERD as a cause of her cough. She states that her anxiety gets worse with albuterol dose  discontinue that

## 2011-04-17 NOTE — Progress Notes (Signed)
40 year old woman with past medical history of depression, anxiety, hypo thyroidism, IBS and GERD comes to the clinic for followup. She was seen 4 days ago for cough which seemed to be a viral  bronchitis. She was asked to continue with supportive care and return to the clinic if her symptoms are not better. She continues to have dry cough throughout the day which has turned into mildly productive cough since the weekend. She is bringing up yellowish sputum without any blood. She is also having low-grade fevers at about 100 Fahrenheit at home. She is not having any runny nose, difficulty breathing, chest pain or chills. She is using over-the-counter cough medicine and lozenges. She also complains of anxiety attacks multiple times in a day Xanax works for her. This has gotten worse in last few months since she is having a lot of family issues. She has tried multiple other medications for anxiety in the past but has not worked for her. She has been seeing a psychotherapist but has not seen a psychiatrist.  She also wants to go through her medical records and she thinks that there are some inconsistencies. Compliant with her medications.

## 2011-04-17 NOTE — Assessment & Plan Note (Signed)
She is on a low dose of Xanax and has been stable on it for quite some time now. I do not think Xanax is the best medicine for her anxiety attacks but she is on a low dose and has been stable so I'm not inclined to change today. Once her family issues get settled down and her current active medical problems are treated I might consider something like Klonopin or SSRI for her anxiety. Also refer to psychiatry at this time. If she requires  high doses of Xanax in the future she might need a medication contract and regular followup with the dosing and prescriptions.

## 2011-05-08 ENCOUNTER — Ambulatory Visit (INDEPENDENT_AMBULATORY_CARE_PROVIDER_SITE_OTHER): Payer: BC Managed Care – PPO | Admitting: Internal Medicine

## 2011-05-08 DIAGNOSIS — F411 Generalized anxiety disorder: Secondary | ICD-10-CM

## 2011-05-08 MED ORDER — ALPRAZOLAM 0.25 MG PO TABS: 0.2500 mg | ORAL_TABLET | Freq: Two times a day (BID) | ORAL | Status: DC | PRN

## 2011-05-08 NOTE — Progress Notes (Signed)
Patient came in 40 minutes late today. She has done this in the past as well. Looking at her chart I saw her one month ago for bronchitis. We talked about anxiety disorder at that time. She is on Xanax which I do not prefer for chronic anxiety but she has been on a low dose for years and do not strongly feel that I should change it at this time. I called her in one month to talk more about her anxiety disorder because she had multiple family issues going on last month. Other than that there is no other acute medical issue that needs to be addressed. I have refused to see this patient today because of that reason. We will reschedule her appointment.

## 2011-05-08 NOTE — Progress Notes (Deleted)
  Subjective:    Patient ID: Donna Davis, female    DOB: 03-13-1971, 40 y.o.   MRN: [id number]  HPI    Review of Systems     Objective:   Physical Exam        Assessment & Plan:

## 2011-06-05 ENCOUNTER — Other Ambulatory Visit: Payer: Self-pay | Admitting: *Deleted

## 2011-06-05 ENCOUNTER — Ambulatory Visit (HOSPITAL_COMMUNITY): Payer: BC Managed Care – PPO | Admitting: Physician Assistant

## 2011-06-05 DIAGNOSIS — F411 Generalized anxiety disorder: Secondary | ICD-10-CM

## 2011-06-05 MED ORDER — ALPRAZOLAM 0.25 MG PO TABS: 0.2500 mg | ORAL_TABLET | Freq: Two times a day (BID) | ORAL | Status: DC | PRN

## 2011-06-05 NOTE — Telephone Encounter (Signed)
[facility]. 

## 2011-06-06 ENCOUNTER — Other Ambulatory Visit: Payer: Self-pay | Admitting: Internal Medicine

## 2011-06-07 LAB — URINALYSIS, ROUTINE W REFLEX MICROSCOPIC
Bilirubin Urine: NEGATIVE
Glucose, UA: NEGATIVE
Hgb urine dipstick: NEGATIVE
Ketones, ur: NEGATIVE
Nitrite: NEGATIVE
Protein, ur: NEGATIVE
Specific Gravity, Urine: 1.007
Urobilinogen, UA: 0.2
pH: 7.5

## 2011-06-07 LAB — URINE CULTURE: Colony Count: >=100000

## 2011-06-11 LAB — DIFFERENTIAL
Basophils Relative: 1
Eosinophils Absolute: 0.2
Eosinophils Relative: 3
Monocytes Absolute: 0.5
Monocytes Relative: 6
Neutro Abs: 5.8

## 2011-06-11 LAB — POCT I-STAT, CHEM 8
Calcium, Ion: 1.1 — ABNORMAL LOW
Chloride: 105
Glucose, Bld: 86
HCT: 48 — ABNORMAL HIGH

## 2011-06-11 LAB — HEPATIC FUNCTION PANEL
ALT: 11
AST: 19
Albumin: 4.2
Alkaline Phosphatase: 52
Bilirubin, Direct: 0.2
Indirect Bilirubin: 0.4
Total Bilirubin: 0.6
Total Protein: 7.6

## 2011-06-11 LAB — URINALYSIS, ROUTINE W REFLEX MICROSCOPIC
Bilirubin Urine: NEGATIVE
Glucose, UA: NEGATIVE
Hgb urine dipstick: NEGATIVE
Ketones, ur: NEGATIVE
Protein, ur: NEGATIVE

## 2011-06-11 LAB — CBC
HCT: 46
Hemoglobin: 15.7 — ABNORMAL HIGH
MCHC: 34.1
MCV: 93.4
RBC: 4.92

## 2011-06-11 LAB — LIPASE, BLOOD: Lipase: 29

## 2011-06-14 ENCOUNTER — Emergency Department (HOSPITAL_COMMUNITY): Payer: BC Managed Care – PPO

## 2011-06-14 ENCOUNTER — Emergency Department (HOSPITAL_COMMUNITY)
Admission: EM | Admit: 2011-06-14 | Discharge: 2011-06-14 | Disposition: A | Discharge status: Home / Self Care | Payer: BC Managed Care – PPO | Attending: Emergency Medicine | Admitting: Emergency Medicine

## 2011-06-14 DIAGNOSIS — R059 Cough, unspecified: Secondary | ICD-10-CM | POA: Insufficient documentation

## 2011-06-14 DIAGNOSIS — R05 Cough: Secondary | ICD-10-CM | POA: Insufficient documentation

## 2011-06-14 DIAGNOSIS — E039 Hypothyroidism, unspecified: Secondary | ICD-10-CM | POA: Insufficient documentation

## 2011-06-14 DIAGNOSIS — R0602 Shortness of breath: Secondary | ICD-10-CM | POA: Insufficient documentation

## 2011-06-14 DIAGNOSIS — R062 Wheezing: Secondary | ICD-10-CM | POA: Insufficient documentation

## 2011-06-14 DIAGNOSIS — R0682 Tachypnea, not elsewhere classified: Secondary | ICD-10-CM | POA: Insufficient documentation

## 2011-06-15 LAB — POCT URINALYSIS DIP (DEVICE)
Ketones, ur: NEGATIVE
Protein, ur: NEGATIVE
Specific Gravity, Urine: 1.01
pH: 7.5

## 2011-06-15 LAB — POCT PREGNANCY, URINE: Operator id: 247071

## 2011-06-18 LAB — COMPREHENSIVE METABOLIC PANEL
Alkaline Phosphatase: 49
BUN: 6
Calcium: 9.5
GFR calc non Af Amer: >60
Glucose, Bld: 92
Total Protein: 7.1

## 2011-06-18 LAB — URINALYSIS, ROUTINE W REFLEX MICROSCOPIC
Bilirubin Urine: NEGATIVE
Glucose, UA: NEGATIVE
Hgb urine dipstick: NEGATIVE
Ketones, ur: NEGATIVE
pH: 7

## 2011-06-18 LAB — DIFFERENTIAL
Basophils Relative: 1
Lymphs Abs: 3.1
Monocytes Relative: 5
Neutro Abs: 4.7
Neutrophils Relative %: 54

## 2011-06-18 LAB — POCT URINALYSIS DIP (DEVICE)
Glucose, UA: NEGATIVE
Operator id: 148111
Specific Gravity, Urine: <=1.005
Urobilinogen, UA: 0.2

## 2011-06-18 LAB — CBC
HCT: 42.2
Hemoglobin: 14.4
MCHC: 34.1
RDW: 12.6

## 2011-06-18 LAB — LIPASE, BLOOD: Lipase: 29

## 2011-06-26 ENCOUNTER — Encounter (INDEPENDENT_AMBULATORY_CARE_PROVIDER_SITE_OTHER): Payer: BC Managed Care – PPO | Admitting: Physician Assistant

## 2011-06-26 DIAGNOSIS — F411 Generalized anxiety disorder: Secondary | ICD-10-CM

## 2011-07-23 ENCOUNTER — Other Ambulatory Visit (HOSPITAL_COMMUNITY): Payer: Self-pay

## 2011-07-24 ENCOUNTER — Ambulatory Visit (HOSPITAL_COMMUNITY): Payer: Self-pay | Admitting: Physician Assistant

## 2011-07-28 ENCOUNTER — Other Ambulatory Visit: Payer: Self-pay | Admitting: Internal Medicine

## 2011-08-13 ENCOUNTER — Ambulatory Visit (INDEPENDENT_AMBULATORY_CARE_PROVIDER_SITE_OTHER): Payer: Self-pay | Admitting: Internal Medicine

## 2011-08-13 ENCOUNTER — Encounter: Payer: Self-pay | Admitting: Internal Medicine

## 2011-08-13 DIAGNOSIS — M722 Plantar fascial fibromatosis: Secondary | ICD-10-CM | POA: Insufficient documentation

## 2011-08-13 DIAGNOSIS — F411 Generalized anxiety disorder: Secondary | ICD-10-CM

## 2011-08-13 DIAGNOSIS — K219 Gastro-esophageal reflux disease without esophagitis: Secondary | ICD-10-CM

## 2011-08-13 LAB — BASIC METABOLIC PANEL
Calcium: 9.5 mg/dL (ref 8.4–10.5)
Glucose, Bld: 78 mg/dL (ref 70–99)
Potassium: 4.2 mEq/L (ref 3.5–5.3)
Sodium: 139 mEq/L (ref 135–145)

## 2011-08-13 MED ORDER — OMEPRAZOLE 40 MG PO CPDR: 40.0000 mg | DELAYED_RELEASE_CAPSULE | Freq: Every day | ORAL | Status: DC

## 2011-08-13 MED ORDER — NAPROXEN 500 MG PO TABS: 500.0000 mg | ORAL_TABLET | Freq: Two times a day (BID) | ORAL | Status: DC

## 2011-08-13 MED ORDER — ALPRAZOLAM 0.5 MG PO TABS: 0.2500 mg | ORAL_TABLET | Freq: Two times a day (BID) | ORAL | Status: DC | PRN

## 2011-08-13 NOTE — Progress Notes (Signed)
Subjective:   Patient ID: Donna Davis female   DOB: Oct 12, 1970 40 y.o.   MRN: [id number]  HPI: Donna Davis is a 40 y.o. female with PMH significant as outlined below who presented to the clinic with sever pain in her feet and legs. Patient noted that it started off in her feet on Wednesday . She felt pins and needles on her bottom of her feet worse when getting up in the morning and when walking . She thought that she is wearing the wrong shoes and therefore she bought new shoes on Saturday. This morning the pain is excruciating and she thought there is something wrong with her back and went to a Chiropractor who evaluated the patient and said her back is fine . She denies any injuries, extreme exercises , travel, swelling or redness of her feet or legs.    Past Medical History  Diagnosis Date  . GERD (gastroesophageal reflux disease)   . Bipolar affective   . Hypothyroidism   . IBS (irritable bowel syndrome)   . Anxiety   . Hearing difficulty     b/l, 2/2 congenital rubella s/p stapedectomy. Using hearing aid  . Headache 04/2005    h/o headache in past that raised question of psuedotumor s/p therapeutic LP with an opening preassure of 18 cm H2O   . Hepatic cyst     6 mm on CT done done in 05/2005  . Polysubstance abuse     including codiene, hydrocodone, tobacco and adderal   Current Outpatient Prescriptions  Medication Sig Dispense Refill  . ALPRAZolam (XANAX) 0.25 MG tablet Take 1 tablet (0.25 mg total) by mouth 2 (two) times daily as needed for anxiety.  60 tablet  0  . levothyroxine (SYNTHROID, LEVOTHROID) 25 MCG tablet TAKE 1 TABLET BY MOUTH EVERY DAY  31 tablet  11  . Norethin-Eth Estrad-Fe Biphas (LO LOESTRIN FE PO) Take by mouth daily.        . pantoprazole (PROTONIX) 20 MG tablet TAKE 1 TABLET BY MOUTH TWICE A DAY FOR 2 WEEKS, THEN 1 A DAY  30 tablet  0  . venlafaxine (EFFEXOR) 37.5 MG tablet Take 37.5 mg by mouth 1 day or 1 dose. 1 PO QD        No family history on  file. History   Social History  . Marital Status: Married    Spouse Name: N/A    Number of Children: N/A  . Years of Education: N/A   Social History Main Topics  . Smoking status: Former Games developer  . Smokeless tobacco: Former Neurosurgeon    Quit date: 11/27/2008   Comment: quit 13yrs ago  . Alcohol Use: No  . Drug Use: No  . Sexually Active: None   Other Topics Concern  . None   Social History Narrative   Current smoker, not using alcohol or drugs at this timeLives with husband and 2 children   Review of Systems: Constitutional: Denies fever, chills, diaphoresis, appetite change and fatigue.    Respiratory: Denies SOB, DOE, cough, chest tightness,  and wheezing.   Cardiovascular: Denies chest pain, palpitations and leg swelling.  Gastrointestinal: Denies nausea, vomiting, abdominal pain, diarrhea, constipation Genitourinary: Denies dysuria, urgency, frequency,  Skin: Denies pallor, rash and wound.  Neurological: Denies dizziness, , syncope, weakness, light-headedness, numbness and headaches.    Objective:  Physical Exam: Filed Vitals:   08/13/11 1517  BP: 134/78  Pulse: 90  Temp: 99 F (37.2 C)  TempSrc: Oral  Height: 5' (1.524  m)  Weight: 182 lb 12.8 oz (82.918 kg)   Constitutional: Vital signs reviewed.  Patient is a well-developed and well-nourished  in no acute distress and cooperative with exam. Alert and oriented x3.  Neck: Supple,  Cardiovascular: RRR, S1 normal, S2 normal, no MRG, pulses symmetric and intact bilaterally Pulmonary/Chest: CTAB, no wheezes, rales, or rhonchi Abdominal: Soft. Non-tender, non-distended, bowel sounds are normal,  Musculoskeletal: No joint deformities, erythema, or stiffness, ROM full and no nontender Neurological: A&O x3, Strenght is normal and symmetric bilaterally,no focal motor deficit, sensory intact to light touch bilaterally.  Skin: Warm, dry and intact. No rash, cyanosis, or clubbing.  Feet: significant tenderness of plantar with  palpation and movement. No rashes. No erythema.

## 2011-08-13 NOTE — Assessment & Plan Note (Signed)
Feet pain likely due plantar fascitis. Will prescribe naproxen 500 mg bid and omeprazole since patient has history of GERD. She was instructed to stretch her feet as much as possible and was given a hand out. She was further informed to use good foot wear and not go bare foot at any times. It will take a long time to have complete resolution. She will use for break through pain tylenol. If no significant improvement is noted by Friday patient need to be re evaluted.  I will also check bmet for possible electrolyte abnormalities.

## 2011-09-02 ENCOUNTER — Other Ambulatory Visit: Payer: Self-pay | Admitting: Internal Medicine

## 2011-11-30 ENCOUNTER — Telehealth: Payer: Self-pay | Admitting: *Deleted

## 2011-11-30 NOTE — Telephone Encounter (Signed)
Pt's husband left message that Donna Davis needs some paperwork faxed to her pharmacy so that she can get her Rx medications. Please call her @ [phone]

## 2011-11-30 NOTE — Telephone Encounter (Signed)
Called and left message for pt that we need more detailed information regarding her request. If she needs paperwork completed, she will need to bring those papers to the clinic; or we need to know what information to fax to her pharmacy and what pharmacy. Please call back with additional message.

## 2011-12-06 NOTE — Telephone Encounter (Signed)
Left message to call back in regard of request paperwork for pharmacy.

## 2012-01-07 ENCOUNTER — Encounter: Payer: Self-pay | Admitting: *Deleted

## 2012-01-29 ENCOUNTER — Emergency Department (HOSPITAL_COMMUNITY): Payer: PRIVATE HEALTH INSURANCE

## 2012-01-29 ENCOUNTER — Emergency Department (HOSPITAL_COMMUNITY)
Admission: EM | Admit: 2012-01-29 | Discharge: 2012-01-29 | Disposition: A | Discharge status: Home / Self Care | Payer: PRIVATE HEALTH INSURANCE | Attending: Emergency Medicine | Admitting: Emergency Medicine

## 2012-01-29 ENCOUNTER — Encounter (HOSPITAL_COMMUNITY): Payer: Self-pay | Admitting: *Deleted

## 2012-01-29 DIAGNOSIS — R6884 Jaw pain: Secondary | ICD-10-CM | POA: Insufficient documentation

## 2012-01-29 DIAGNOSIS — Z87891 Personal history of nicotine dependence: Secondary | ICD-10-CM | POA: Insufficient documentation

## 2012-01-29 DIAGNOSIS — R220 Localized swelling, mass and lump, head: Secondary | ICD-10-CM | POA: Insufficient documentation

## 2012-01-29 DIAGNOSIS — E039 Hypothyroidism, unspecified: Secondary | ICD-10-CM | POA: Insufficient documentation

## 2012-01-29 DIAGNOSIS — K219 Gastro-esophageal reflux disease without esophagitis: Secondary | ICD-10-CM | POA: Insufficient documentation

## 2012-01-29 DIAGNOSIS — K1370 Unspecified lesions of oral mucosa: Secondary | ICD-10-CM | POA: Insufficient documentation

## 2012-01-29 DIAGNOSIS — H9209 Otalgia, unspecified ear: Secondary | ICD-10-CM | POA: Insufficient documentation

## 2012-01-29 MED ORDER — HYDROCODONE-ACETAMINOPHEN 5-325 MG PO TABS: 1.0000 | ORAL_TABLET | Freq: Four times a day (QID) | ORAL | Status: AC | PRN

## 2012-01-29 NOTE — ED Notes (Signed)
Pt in c/o right mouth pain and earache, states she feels like everything to right side of mouth is swollen, states she was seen for same and given ibuprofen but it has not helped

## 2012-01-29 NOTE — Discharge Instructions (Signed)
Follow-up with your ENT as discussed

## 2012-01-29 NOTE — ED Provider Notes (Signed)
History     CSN: [id number]  Arrival date & time 01/29/12  1717   First MD Initiated Contact with Patient 01/29/12 1816      Chief Complaint  Patient presents with  . Mouth Pain     (Consider location/radiation/quality/duration/timing/severity/associated sxs/prior treatment) Patient is a 41 y.o. female presenting with tooth pain.  Dental PainThe primary symptoms include mouth pain. Primary symptoms do not include dental injury or shortness of breath. The symptoms began more than 1 week ago. The symptoms are waxing and waning. The symptoms are recurrent. The symptoms occur frequently.  Additional symptoms include: jaw pain, facial swelling and ear pain. Additional symptoms do not include: dental sensitivity to temperature, gum swelling, gum tenderness, purulent gums, trouble swallowing, excessive salivation, drooling and swollen glands.   Patient has seen her dentist, who advises that she does not currently have any dental issues that would contribute to symptoms.  Patient is deaf, wears hearing aids, reads lips well. Past Medical History  Diagnosis Date  . GERD (gastroesophageal reflux disease)   . Bipolar affective   . Hypothyroidism   . IBS (irritable bowel syndrome)   . Anxiety   . Hearing difficulty     b/l, 2/2 congenital rubella s/p stapedectomy. Using hearing aid  . Headache 04/2005    h/o headache in past that raised question of psuedotumor s/p therapeutic LP with an opening preassure of 18 cm H2O   . Hepatic cyst     6 mm on CT done done in 05/2005  . Polysubstance abuse     including codiene, hydrocodone, tobacco and adderal    Past Surgical History  Procedure Date  . Abdominal hysterectomy     partial hysterecotmy 2003 for endoemtriosis with residula right ovaey. Multiple GYN surgeries for chronic pelvic pain  . Cesarean section 1994    History reviewed. No pertinent family history.  History  Substance Use Topics  . Smoking status: Former Games developer  . Smokeless  tobacco: Former Neurosurgeon    Quit date: 11/27/2008   Comment: quit 54yrs ago  . Alcohol Use: No    OB History    Grav Para Term Preterm Abortions TAB SAB Ect Mult Living                  Review of Systems  HENT: Positive for ear pain and facial swelling. Negative for drooling and trouble swallowing.   Respiratory: Negative for shortness of breath.   All other systems reviewed and are negative.    Allergies  Onion; Amoxicillin-pot clavulanate; Cefuroxime; Cephalexin; Ciprofloxacin; Clarithromycin; Clarithromycin; Doxycycline; Prednisone; Shrimp flavor; Sulfonamide derivatives; and Tramadol hcl  Home Medications   Current Outpatient Rx  Name Route Sig Dispense Refill  . IBUPROFEN 200 MG PO TABS Oral Take 800 mg by mouth every 6 (six) hours as needed. For pain.    Marland Kitchen LEVOTHYROXINE SODIUM 25 MCG PO TABS  TAKE 1 TABLET BY MOUTH EVERY DAY 31 tablet 11  . LO LOESTRIN FE PO Oral Take by mouth daily.        BP 125/91  Pulse 84  Temp(Src) 99.1 F (37.3 C) (Oral)  Resp 20  SpO2 100%  LMP 11/28/2003  Physical Exam  Nursing note and vitals reviewed. Constitutional: She is oriented to person, place, and time. She appears well-developed and well-nourished. No distress.  HENT:  Head: Normocephalic.  Mouth/Throat: Oropharynx is clear and moist. No oropharyngeal exudate.  Eyes: Conjunctivae are normal. Pupils are equal, round, and reactive to light.  Neck: Normal  range of motion. Neck supple.  Cardiovascular: Normal rate, regular rhythm, normal heart sounds and intact distal pulses.   Pulmonary/Chest: Effort normal and breath sounds normal.  Abdominal: Soft.  Musculoskeletal: Normal range of motion.  Lymphadenopathy:    She has no cervical adenopathy.  Neurological: She is alert and oriented to person, place, and time.  Skin: Skin is warm and dry.  Psychiatric: She has a normal mood and affect. Her behavior is normal. Judgment and thought content normal.    ED Course  Procedures  (including critical care time)  Labs Reviewed - No data to display No results found.   No diagnosis found. No evidence of dental disease or ear infection. Radiology results reviewed and discussed with patient and with Dr. Alto Denver.  No clear cause of symptoms identified.  Patient has an ENT that she can follow-up with.  MDM          Jimmye Norman, NP 01/29/12 2025

## 2012-01-29 NOTE — ED Notes (Signed)
Patient states that she has had right sided mouth pain for 2 weeks.  Pt went to her dentist for same and was given Tylenol with Codeine and Ibuprofen.  Patient has taken all of the medications and continues to have pain. Pt denies having any bad teeth, cavities or any mouth sores. Pt states that the pain is now radiating into her right ear.

## 2012-02-01 NOTE — ED Provider Notes (Signed)
Medical screening examination/treatment/procedure(s) were performed by non-physician practitioner and as supervising physician I was immediately available for consultation/collaboration.  Rasheema Truluck, MD 02/01/12 1323 

## 2012-02-19 ENCOUNTER — Telehealth: Payer: Self-pay | Admitting: *Deleted

## 2012-02-19 ENCOUNTER — Telehealth: Payer: Self-pay | Admitting: Gastroenterology

## 2012-02-19 NOTE — Telephone Encounter (Signed)
Pt stopped by [facility] - needs appt - has rectal odor recently. Hx of IBS  - IBS has been doing well recently - called Dr Jarold Motto can not see pt till 03/18/12. Pt is hearing impaired - no way of communication while at home. Appt made 02/21/12 PM in clinic. [facility] will make arrangement for interpreter- sign. Stanton Kidney Gissella Niblack RN 02/19/12 4:30PM

## 2012-02-20 NOTE — Telephone Encounter (Signed)
Spoke with a nurse at [facility] out pt clinic. Pt saw them late yesterday and r/s her for 02/21/12 when they will have someone present to translate or interpret for the pt; PT IS HEARING IMPAIRED. Explained to the nurse according to our records, pt hasn't been seen since MAY, 2009 and she was referred to the GI Clinic in [facility]; we assumed she was following up there. Clinic will see the pt tomorrow and if needed they will call us back.

## 2012-02-21 ENCOUNTER — Encounter: Payer: Self-pay | Admitting: Internal Medicine

## 2012-02-21 ENCOUNTER — Ambulatory Visit (INDEPENDENT_AMBULATORY_CARE_PROVIDER_SITE_OTHER): Payer: PRIVATE HEALTH INSURANCE | Admitting: Internal Medicine

## 2012-02-21 VITALS — BP 122/85 | HR 87 | Temp 99.0°F | Ht 60.0 in | Wt 184.9 lb

## 2012-02-21 DIAGNOSIS — R1084 Generalized abdominal pain: Secondary | ICD-10-CM

## 2012-02-21 DIAGNOSIS — E039 Hypothyroidism, unspecified: Secondary | ICD-10-CM

## 2012-02-21 LAB — COMPLETE METABOLIC PANEL WITH GFR
Albumin: 4.5 g/dL (ref 3.5–5.2)
Alkaline Phosphatase: 44 U/L (ref 39–117)
Calcium: 9.4 mg/dL (ref 8.4–10.5)
Creat: 0.71 mg/dL (ref 0.50–1.10)
GFR, Est Non African American: >89 mL/min
Glucose, Bld: 81 mg/dL (ref 70–99)
Potassium: 3.9 mEq/L (ref 3.5–5.3)
Sodium: 139 mEq/L (ref 135–145)
Total Protein: 7.2 g/dL (ref 6.0–8.3)

## 2012-02-21 LAB — CBC WITH DIFFERENTIAL/PLATELET
Basophils Relative: 0 % (ref 0–1)
Hemoglobin: 13.6 g/dL (ref 12.0–15.0)
Lymphocytes Relative: 41 % (ref 12–46)
Lymphs Abs: 3.4 10*3/uL (ref 0.7–4.0)
MCHC: 34.4 g/dL (ref 30.0–36.0)
MCV: 89.6 fL (ref 78.0–100.0)
Monocytes Relative: 8 % (ref 3–12)
Neutro Abs: 4.1 10*3/uL (ref 1.7–7.7)
Neutrophils Relative %: 49 % (ref 43–77)
RBC: 4.41 MIL/uL (ref 3.87–5.11)
WBC: 8.3 10*3/uL (ref 4.0–10.5)

## 2012-02-21 MED ORDER — ACETAMINOPHEN 500 MG PO TABS: 500.0000 mg | ORAL_TABLET | Freq: Four times a day (QID) | ORAL | Status: DC | PRN

## 2012-02-21 MED ORDER — LEVOTHYROXINE SODIUM 25 MCG PO TABS: 25.0000 ug | ORAL_TABLET | Freq: Every day | ORAL | Status: DC

## 2012-02-21 NOTE — Patient Instructions (Signed)
I would like to get an ultrasound of her abdomen to see if he might have biliary colic. I will call you with the results of this test tomorrow. I would also like you to test your stool for blood. Please test 3 separate stools and mail [place] the results.   Please bring all your medications to your next clinic appointment.      Biliary Colic  Biliary colic is a steady or irregular pain in the upper abdomen. It is usually under the right side of the rib cage. It happens when gallstones interfere with the normal flow of bile from the gallbladder. Bile is a liquid that helps to digest fats. Bile is made in the liver and stored in the gallbladder. When you eat a meal, bile passes from the gallbladder through the cystic duct and the common bile duct into the small intestine. There, it mixes with partially digested food. If a gallstone blocks either of these ducts, the normal flow of bile is blocked. The muscle cells in the bile duct contract forcefully to try to move the stone. This causes the pain of biliary colic.  SYMPTOMS   A person with biliary colic usually complains of pain in the upper abdomen. This pain can be:   In the center of the upper abdomen just below the breastbone.   In the upper-right part of the abdomen, near the gallbladder and liver.   Spread back toward the right shoulder blade.   Nausea and vomiting.   The pain usually occurs after eating.   Biliary colic is usually triggered by the digestive system's demand for bile. The demand for bile is high after fatty meals. Symptoms can also occur when a person who has been fasting suddenly eats a very large meal. Most episodes of biliary colic pass after 1 to 5 hours. After the most intense pain passes, your abdomen may continue to ache mildly for about 24 hours.  DIAGNOSIS  After you describe your symptoms, your caregiver will perform a physical exam. He or she will pay attention to the upper right portion of your belly (abdomen).  This is the area of your liver and gallbladder. An ultrasound will help your caregiver look for gallstones. Specialized scans of the gallbladder may also be done. Blood tests may be done, especially if you have fever or if your pain persists. PREVENTION  Biliary colic can be prevented by controlling the risk factors for gallstones. Some of these risk factors, such as heredity, increasing age, and pregnancy are a normal part of life. Obesity and a high-fat diet are risk factors you can change through a healthy lifestyle. Women going through menopause who take hormone replacement therapy (estrogen) are also more likely to develop biliary colic. TREATMENT   Pain medication may be prescribed.   You may be encouraged to eat a fat-free diet.   If the first episode of biliary colic is severe, or episodes of colic keep retuning, surgery to remove the gallbladder (cholecystectomy) is usually recommended. This procedure can be done through small incisions using an instrument called a laparoscope. The procedure often requires a brief stay in the hospital. Some people can leave the hospital the same day. It is the most widely used treatment in people troubled by painful gallstones. It is effective and safe, with no complications in more than 90% of cases.   If surgery cannot be done, medication that dissolves gallstones may be used. This medication is expensive and can take months or years to  work. Only small stones will dissolve.   Rarely, medication to dissolve gallstones is combined with a procedure called shock-wave lithotripsy. This procedure uses carefully aimed shock waves to break up gallstones. In many people treated with this procedure, gallstones form again within a few years.  PROGNOSIS  If gallstones block your cystic duct or common bile duct, you are at risk for repeated episodes of biliary colic. There is also a 25% chance that you will develop a gallbladder infection(acute cholecystitis), or some  other complication of gallstones within 10 to 20 years. If you have surgery, schedule it at a time that is convenient for you and at a time when you are not sick. HOME CARE INSTRUCTIONS   Drink plenty of clear fluids.   Avoid fatty, greasy or fried foods, or any foods that make your pain worse.   Take medications as directed.  SEEK MEDICAL CARE IF:   You develop a fever over 100.5 F (38.1 C).   Your pain gets worse over time.   You develop nausea or vomiting that prevents you from eating and drinking. SEEK IMMEDIATE MEDICAL CARE IF:   You have continuous or severe belly (abdominal) pain which is not relieved with medications.   You develop nausea and vomiting which is not relieved with medications.   You have symptoms of biliary colic and you suddenly develop a fever and shaking chills. This may signal cholecystitis. Call your caregiver immediately.   You develop a yellow color to your skin or the white part of your eyes (jaundice).  Document Released: 02/04/2006 Document Revised: 08/23/2011 Document Reviewed: 04/15/2008 [facility] Patient Information 2012 [place], [place].  Guaiac Test A guaiac test checks for blood in the poop (bowel movement). BEFORE THE TEST  Avoid alcohol and iron pills.   Only take medicine as told by your doctor.   Ask your doctor about stopping or changing medicines.   Avoid Vitamin C pills or medicines.   Avoid antiseptic solution that has iodine in it.   Avoid citrus fruits and juices.  TEST  Put the sample cards and sticks in the bathroom so they will be ready when you have to poop.   Collect a poop sample with the stick.   Smear a small amount of poop on the card.   Take another sample from a different part of the poop.   Store the cards with the samples in a plastic bag.   Throw the rest of the poop into the toilet and flush.   Wash your hands well.  AFTER THE TEST  Bring the cards in a plastic bag to your clinic.   Do not wait  more than 3 days to take the card to your clinic.  Document Released: 06/12/2008 Document Revised: 08/23/2011 Document Reviewed: 06/12/2008 [facility] Patient Information 2012 [place], [place].

## 2012-02-21 NOTE — Assessment & Plan Note (Addendum)
Patient denies symptoms of hypothyroidism. We will refill her prescription for Synthroid today. If her abdominal pain is not due to gallstones, we can check her TSH at her next visit on the remote chance that this is secondary to thyroid dysfunction.

## 2012-02-21 NOTE — Assessment & Plan Note (Addendum)
Her abdominal pain is concerning for biliary colic as it is intermittent, cramping/wrenching and resolves with only mild achiness afterwards. She is also obese, in her 47s, and a woman placing her at increased risk for gallstones. She is afebrile and has had no subjective fevers, so my concern for cholecystitis his low.  She could have intra-abdominal lesions that she is had a hysterectomy in the past. Also this could be secondary to IBS, though she states this feels very different than her prior episodes. My concern for a gynecologic process is very low as she denies any gynecologic symptoms. Stool guaiac negative today. We will obtain a CMP to evaluate renal function, LFTs and possible obstruction. We will obtain right upper quadrant ultrasound (we will have to obtain preauthorization) to rule out gallstones. Overall the patient seems stable and has good family support. I think she is safe at home and has the ability to get to the ED if necessary. I told her that she should go to the ED if she develops fever greater than 101.5, intractable vomiting or nausea, or intractable pain. I also provided her with stool cards to test her stool at home for blood. We will followup with her after she has her right upper quadrant ultrasound. Hopefully this will be tomorrow.

## 2012-02-21 NOTE — Progress Notes (Signed)
Subjective:     Patient ID: Donna Davis, female   DOB: 1970-11-10, 41 y.o.   MRN: [id number]  Abdominal Pain This is a new problem. Episode onset: 2-3 weeks ago. The onset quality is gradual. The problem occurs daily. Duration: 2 minutes to 3 hours. The problem has been gradually worsening. The pain is located in the generalized abdominal region. The pain is severe (She states the pain sometimes causes her to lay in a fetal position and cry. They will then resolve and she will have no pain. Sometimes she has a mild ache after severe episodes.). The quality of the pain is cramping (Wrenching). The abdominal pain does not radiate. Associated symptoms include frequency, nausea and vomiting. Pertinent negatives include no constipation, diarrhea, dysuria, fever, headaches, hematochezia, hematuria, melena, myalgias or weight loss. Associated symptoms comments: She states there has been intermittent blood on the tissue and on her underwear after bowel movements. She occasionally will see blood on and questionably inside her stool. She states it's just a small amount of blood. She states that she feels lightheaded when standing and fell 2 days ago. She states she has had increased frequency of stool. She is having bowel movements daily. It is normal for her to be constipated and only have bowel movements twice weekly.. The pain is aggravated by nothing (Pain is unchanged with food or bowel movements). The pain is relieved by certain positions (Better in the fetal position). She has tried acetaminophen for the symptoms. The treatment provided no relief. Her past medical history is significant for abdominal surgery and irritable bowel syndrome. She states the pain does not feel like her IBS. She also had an abdominal hysterectomy    she states her mother had a cholecystectomy about her age. She denies any sick contacts. She lives with her family; they all drink the same well water.   Review of Systems    Constitutional: Negative for fever, chills, weight loss, diaphoresis and fatigue.  Respiratory: Negative for shortness of breath.   Cardiovascular: Negative for chest pain and leg swelling.  Gastrointestinal: Positive for nausea, vomiting and abdominal pain. Negative for diarrhea, constipation, melena and hematochezia.  Genitourinary: Positive for frequency. Negative for dysuria and hematuria.  Musculoskeletal: Negative for myalgias.  Neurological: Negative for headaches.       Objective:   Physical Exam  Nursing note and vitals reviewed. Constitutional: She appears well-developed and well-nourished.       Obese  HENT:  Head: Normocephalic and atraumatic.  Cardiovascular: Normal rate, regular rhythm, normal heart sounds and intact distal pulses.  Exam reveals no gallop and no friction rub.   No murmur heard. Pulmonary/Chest: Effort normal and breath sounds normal. No respiratory distress. She has no wheezes. She has no rales. She exhibits no tenderness.  Abdominal: Soft. Normal appearance and bowel sounds are normal. She exhibits no distension and no mass. There is no hepatosplenomegaly. There is tenderness in the right upper quadrant. There is positive Murphy's sign. There is no rigidity, no rebound, no guarding, no CVA tenderness and no tenderness at McBurney's point. No hernia.  Genitourinary: Rectum normal. Rectal exam shows no external hemorrhoid, no internal hemorrhoid, no fissure, no mass, no tenderness and anal tone normal. Guaiac negative stool. Pelvic exam was performed with patient in the knee-chest position.  Musculoskeletal: She exhibits no edema.  Neurological: She is alert. No cranial nerve deficit.       She is deaf  Skin: Skin is warm and dry.  Psychiatric: She has  a normal mood and affect. Her behavior is normal. Judgment and thought content normal.       Assessment:     Please see problem oriented charting for assessment and plan by problem (best viewed under  encounters tab).

## 2012-02-22 ENCOUNTER — Telehealth: Payer: Self-pay | Admitting: *Deleted

## 2012-02-22 NOTE — Telephone Encounter (Signed)
CALLED AND LEFT VOICE MESSAGE FOR Donna Davis TO CALL [facility] AT [phone]. WE NEED FOR HER TO BRING HER INSURANCE CARD SO IT CAN BE SCANNED / HER INSURANCE HAS TO BE PRE-CERT. BEFORE I CAN MAKE APPT FOR HER U/S. DR RAISCH IS AWARE OF THIS.  Selene Peltzer NT [phone]  11:47AM

## 2012-02-25 ENCOUNTER — Other Ambulatory Visit: Payer: Self-pay | Admitting: *Deleted

## 2012-02-25 ENCOUNTER — Other Ambulatory Visit (INDEPENDENT_AMBULATORY_CARE_PROVIDER_SITE_OTHER): Payer: PRIVATE HEALTH INSURANCE

## 2012-02-25 ENCOUNTER — Telehealth: Payer: Self-pay | Admitting: *Deleted

## 2012-02-25 DIAGNOSIS — R1084 Generalized abdominal pain: Secondary | ICD-10-CM

## 2012-02-25 DIAGNOSIS — R52 Pain, unspecified: Secondary | ICD-10-CM

## 2012-02-25 LAB — POC HEMOCCULT BLD/STL (HOME/3-CARD/SCREEN): Card #2 Fecal Occult Blod, POC: POSITIVE

## 2012-02-25 NOTE — Telephone Encounter (Signed)
CALLED PATIENT LEFT VOICE MESSAGE ON CELL AND HOUSE PHONE.  APPT  INFO.  / [name]- U/S June 11.013 @ 11:30AM- ARRIVE 11:15AM. Donna Davis NT [phone]  10:42AM

## 2012-02-25 NOTE — Telephone Encounter (Signed)
Needs refill on Alprazoloam 0.25mg   [facility] / [address] - takes PRN for anxiety. On hx med sheet.

## 2012-02-26 ENCOUNTER — Other Ambulatory Visit (HOSPITAL_COMMUNITY): Payer: PRIVATE HEALTH INSURANCE

## 2012-02-27 NOTE — Telephone Encounter (Signed)
I am not this patient's PCP.  I saw her for her acute visit for her abdominal pain.  I feel that it would be most appropriate for her to discuss this with her PCP as I did not evaluate her for anxiety and have never met her prior to our single encounter.

## 2012-02-28 ENCOUNTER — Telehealth: Payer: Self-pay | Admitting: Internal Medicine

## 2012-02-28 DIAGNOSIS — R1084 Generalized abdominal pain: Secondary | ICD-10-CM

## 2012-02-28 NOTE — Telephone Encounter (Signed)
S: I called Ms Polyakov at home and on her cell phone. Left a msg on her cell phone regarding the results of her stool cards.  Told her that 2/3 cards were positive for occult blood. Asked her to call her gastroenterologist asap and have her appointment moved up to a sooner date. Also asked her to get the pelvic US done as well, but if she was to choose, I would rather she go to the gastroenterologist.  O: Lab Results  Component Value Date   OCCULTBLD Positive 02/25/2012   OCCULTBLD Positive 02/25/2012   OCCULTBLD Negative 02/25/2012   Lab Results  Component Value Date   CREATININE 0.71 02/21/2012   BUN 6 02/21/2012   NA 139 02/21/2012   K 3.9 02/21/2012   CL 104 02/21/2012   CO2 24 02/21/2012   Lab Results  Component Value Date   LIPASE 23 11/28/2010   Lab Results  Component Value Date   ALT 8 02/21/2012   AST 15 02/21/2012   ALKPHOS 44 02/21/2012   BILITOT 0.6 02/21/2012   A: FOBT + in a woman with abdominal pain (no fever at our visit). Currently has a GI specialist and has an appt in the next few weeks. Needs to follow up with GI sooner  P: ambulatory referral to GI made today to speed up process. Also asked patient to call and tell them about her results and be seen sooner.   Tiandra Swoveland  11:34 AM

## 2012-02-28 NOTE — Telephone Encounter (Signed)
Sent note to Dr Scot Dock.

## 2012-02-29 ENCOUNTER — Emergency Department (HOSPITAL_COMMUNITY): Payer: PRIVATE HEALTH INSURANCE

## 2012-02-29 ENCOUNTER — Ambulatory Visit (HOSPITAL_COMMUNITY)
Admission: RE | Admit: 2012-02-29 | Discharge: 2012-02-29 | Disposition: A | Discharge status: Home / Self Care | Payer: PRIVATE HEALTH INSURANCE | Source: Ambulatory Visit | Attending: Internal Medicine | Admitting: Internal Medicine

## 2012-02-29 ENCOUNTER — Encounter (HOSPITAL_COMMUNITY): Payer: Self-pay | Admitting: Emergency Medicine

## 2012-02-29 ENCOUNTER — Emergency Department (HOSPITAL_COMMUNITY)
Admission: EM | Admit: 2012-02-29 | Discharge: 2012-02-29 | Disposition: A | Discharge status: Home / Self Care | Payer: PRIVATE HEALTH INSURANCE | Attending: Emergency Medicine | Admitting: Emergency Medicine

## 2012-02-29 ENCOUNTER — Other Ambulatory Visit: Payer: Self-pay | Admitting: Internal Medicine

## 2012-02-29 DIAGNOSIS — K219 Gastro-esophageal reflux disease without esophagitis: Secondary | ICD-10-CM | POA: Insufficient documentation

## 2012-02-29 DIAGNOSIS — E039 Hypothyroidism, unspecified: Secondary | ICD-10-CM | POA: Insufficient documentation

## 2012-02-29 DIAGNOSIS — R109 Unspecified abdominal pain: Secondary | ICD-10-CM | POA: Insufficient documentation

## 2012-02-29 DIAGNOSIS — R1084 Generalized abdominal pain: Secondary | ICD-10-CM

## 2012-02-29 DIAGNOSIS — Z87891 Personal history of nicotine dependence: Secondary | ICD-10-CM | POA: Insufficient documentation

## 2012-02-29 DIAGNOSIS — R1011 Right upper quadrant pain: Secondary | ICD-10-CM | POA: Insufficient documentation

## 2012-02-29 DIAGNOSIS — R110 Nausea: Secondary | ICD-10-CM | POA: Insufficient documentation

## 2012-02-29 DIAGNOSIS — F411 Generalized anxiety disorder: Secondary | ICD-10-CM | POA: Insufficient documentation

## 2012-02-29 DIAGNOSIS — K589 Irritable bowel syndrome without diarrhea: Secondary | ICD-10-CM | POA: Insufficient documentation

## 2012-02-29 DIAGNOSIS — Z882 Allergy status to sulfonamides status: Secondary | ICD-10-CM | POA: Insufficient documentation

## 2012-02-29 DIAGNOSIS — F319 Bipolar disorder, unspecified: Secondary | ICD-10-CM | POA: Insufficient documentation

## 2012-02-29 LAB — DIFFERENTIAL
Basophils Absolute: 0 10*3/uL (ref 0.0–0.1)
Basophils Relative: 0 % (ref 0–1)
Lymphocytes Relative: 36 % (ref 12–46)
Neutro Abs: 3.5 10*3/uL (ref 1.7–7.7)
Neutrophils Relative %: 54 % (ref 43–77)

## 2012-02-29 LAB — CBC
MCHC: 34 g/dL (ref 30.0–36.0)
Platelets: 278 10*3/uL (ref 150–400)
RDW: 12.1 % (ref 11.5–15.5)
WBC: 6.4 10*3/uL (ref 4.0–10.5)

## 2012-02-29 LAB — COMPREHENSIVE METABOLIC PANEL
AST: 15 U/L (ref 0–37)
Albumin: 4 g/dL (ref 3.5–5.2)
Alkaline Phosphatase: 51 U/L (ref 39–117)
Chloride: 102 mEq/L (ref 96–112)
Creatinine, Ser: 0.73 mg/dL (ref 0.50–1.10)
Potassium: 3.8 mEq/L (ref 3.5–5.1)
Total Bilirubin: 0.3 mg/dL (ref 0.3–1.2)
Total Protein: 7.8 g/dL (ref 6.0–8.3)

## 2012-02-29 LAB — URINALYSIS, ROUTINE W REFLEX MICROSCOPIC
Glucose, UA: NEGATIVE mg/dL
Hgb urine dipstick: NEGATIVE
Leukocytes, UA: NEGATIVE
Protein, ur: NEGATIVE mg/dL
pH: 6 (ref 5.0–8.0)

## 2012-02-29 LAB — LIPASE, BLOOD: Lipase: 42 U/L (ref 11–59)

## 2012-02-29 LAB — POCT PREGNANCY, URINE: Preg Test, Ur: NEGATIVE

## 2012-02-29 MED ORDER — OXYCODONE-ACETAMINOPHEN 5-325 MG PO TABS: 1.0000 | ORAL_TABLET | Freq: Four times a day (QID) | ORAL | Status: DC | PRN

## 2012-02-29 MED ORDER — ONDANSETRON 8 MG PO TBDP: 8.0000 mg | ORAL_TABLET | Freq: Three times a day (TID) | ORAL | Status: AC | PRN

## 2012-02-29 MED ORDER — MORPHINE SULFATE 4 MG/ML IJ SOLN
6.0000 mg | Freq: Once | INTRAMUSCULAR | Status: AC
  Administered 2012-02-29: 6 mg via INTRAVENOUS
  Filled 2012-02-29: qty 2

## 2012-02-29 MED ORDER — OXYCODONE-ACETAMINOPHEN 5-325 MG PO TABS
1.0000 | ORAL_TABLET | Freq: Once | ORAL | Status: AC
  Administered 2012-02-29: 1 via ORAL
  Filled 2012-02-29: qty 1

## 2012-02-29 MED ORDER — ONDANSETRON HCL 4 MG/2ML IJ SOLN
4.0000 mg | Freq: Once | INTRAMUSCULAR | Status: AC
  Administered 2012-02-29: 4 mg via INTRAVENOUS
  Filled 2012-02-29: qty 2

## 2012-02-29 NOTE — Discharge Instructions (Signed)
Please read and follow all provided instructions.  Your diagnoses today include:  1. Abdominal pain     Tests performed today include:  Blood counts and electrolytes  Blood tests to check liver and kidney function  Blood tests to check pancreas function  Urine test to look for infection  X-ray of your abdomen which shows moderate amount of stool, no other problems  Vital signs. See below for your results today.   Medications prescribed:   Percocet (oxycodone/acetaminophen) - narcotic pain medication  You have been prescribed narcotic pain medication such as Vicodin or Percocet: DO NOT drive or perform any activities that require you to be awake and alert because this medicine can make you drowsy. BE VERY CAREFUL not to take multiple medicines containing Tylenol (also called acetaminophen). Doing so can lead to an overdose which can damage your liver and cause liver failure and possibly death.    Zofran (ondansetron) - for nausea and vomiting  Take any prescribed medications only as directed.  Home care instructions:   Follow any educational materials contained in this packet.  Follow-up instructions: Please follow-up with your primary care provider in the next 3 days for further evaluation of your symptoms. You may also call the GI doctor referral as well.  If you do not have a primary care doctor -- see below for referral information.   Return instructions:  SEEK IMMEDIATE MEDICAL ATTENTION IF:  The pain does not go away or becomes severe   A temperature above 101F develops   Repeated vomiting occurs (multiple episodes)   The pain becomes localized to portions of the abdomen. The right side could possibly be appendicitis. In an adult, the left lower portion of the abdomen could be colitis or diverticulitis.   Blood is being passed in stools or vomit (bright red or black tarry stools)   You develop chest pain, difficulty breathing, dizziness or fainting, or become  confused, poorly responsive, or inconsolable (young children)  If you have any other emergent concerns regarding your health  Additional Information: Abdominal (belly) pain can be caused by many things. Your caregiver performed an examination and possibly ordered blood/urine tests and imaging (CT scan, x-rays, ultrasound). Many cases can be observed and treated at home after initial evaluation in the emergency department. Even though you are being discharged home, abdominal pain can be unpredictable. Therefore, you need a repeated exam if your pain does not resolve, returns, or worsens. Most patients with abdominal pain don't have to be admitted to the hospital or have surgery, but serious problems like appendicitis and gallbladder attacks can start out as nonspecific pain. Many abdominal conditions cannot be diagnosed in one visit, so follow-up evaluations are very important.  Your vital signs today were: BP 125/73  Pulse 75  Resp 18  SpO2 98%  LMP 11/28/2003 If your blood pressure (bp) was elevated above 135/85 this visit, please have this repeated by your doctor within one month. -------------- No Primary Care Doctor Call [facility]  [phone] Other agencies that provide inexpensive medical care    [facility]  [phone]    [facility]  [phone]    [facility]  [phone]    [facility]  [phone]    [facility]  [phone]    [facility]  [phone] -------------- RESOURCE GUIDE:  Dental Problems  Patients with Medicaid: [facility]  [facility] [address]                                            [phone] [address] [employer] [address] Phone:  [phone]                                                      Phone:  [phone]  If unable to pay or uninsured, contact:  [facility] or [facility]. to become qualified for the adult dental clinic.  Chronic Pain Problems Contact  [facility]  [phone] Patients need to be referred by their primary care doctor.  Insufficient Money for Medicine Contact [employer]:  call "211" or [facility] [phone].  Psychological Services [facility] Behavioral Health  [phone] [facility]  [phone] [facility]   [phone] (emergency services [phone])  Substance Abuse Resources [facility]  [phone] [facility] [phone] [facility] [place] [phone] Floydene Flock [phone] Residential & Outpatient Substance Abuse Program  [phone]  Abuse/Neglect [facility] Child Abuse Hotline 253-160-7850 [facility] Child Abuse Hotline (657)669-9903 (After Hours)  Emergency Shelter [facility] [phone]  Maternity Homes [facility] [place] [facility] [phone] [place] [facility] [phone]  [facility] Resources  [facility]     [employer]                          [facility] Dept. [address] [place]                       [address]      [address]  Blondell Reveal Phone:  [phone]                                   Phone:  [phone]                 Phone:  [phone]  [facility] Mental Health Phone:  [phone]  [facility] Child Abuse Hotline 915-670-8415 [phone] (After Hours)

## 2012-02-29 NOTE — ED Notes (Signed)
Discharge instructions reviewed with pt; verbalizes understanding.  No questions asked; No further c/o's voiced.  Pt ambulatory to lobby.

## 2012-02-29 NOTE — ED Notes (Signed)
Pt had ultrasound this am for abd pain and foul odor from rectum; pt sts now having increasing pain in upper abd; pt hearing impaired with translator; pt sts some nausea

## 2012-02-29 NOTE — ED Provider Notes (Signed)
History     CSN: [id number]  Arrival date & time 02/29/12  1010   First MD Initiated Contact with Patient 02/29/12 1037      Chief Complaint  Patient presents with  . Abdominal Pain  . Nausea    (Consider location/radiation/quality/duration/timing/severity/associated sxs/prior treatment) HPI Comments: Patient with history of IBS -- presents with complaint of several weeks of generalized abdominal pain that has been sharp in the right upper quadrant the past 2 days. Patient has seen her primary care physician for this. She was scheduled to have an ultrasound of her abdomen performed this morning which was done and was negative for gallstones. She had increased pain during this testing so she came to the emergency department for evaluation. Patient was recently sent home with stool cards of which 2 of 3 were positive for blood. Patient had one episode of vomiting yesterday. She states she thinks she has had fevers "on and off". Patient typically has one bowel movement per week but has been having several soft bowel movements per day during this time. Patient has used some pain medications at home without much relief. The pain does not radiate. Nothing makes the pain better. Onset was gradual. Course is constant.  Patient is a 42 y.o. female presenting with abdominal pain. The history is provided by the patient.  Abdominal Pain The primary symptoms of the illness include abdominal pain, fever (on and off), fatigue, nausea, vomiting and hematochezia. The primary symptoms of the illness do not include shortness of breath, diarrhea, hematemesis, dysuria or vaginal discharge. The current episode started more than 2 days ago. The onset of the illness was gradual. The problem has not changed since onset. The patient states that she believes she is currently not pregnant. The patient has had a change in bowel habit. Symptoms associated with the illness do not include anorexia, heartburn, hematuria,  frequency or back pain.    Past Medical History  Diagnosis Date  . GERD (gastroesophageal reflux disease)   . Bipolar affective   . Hypothyroidism   . IBS (irritable bowel syndrome)   . Anxiety   . Hearing difficulty     b/l, 2/2 congenital rubella s/p stapedectomy. Using hearing aid  . Headache 04/2005    h/o headache in past that raised question of psuedotumor s/p therapeutic LP with an opening preassure of 18 cm H2O   . Hepatic cyst     6 mm on CT done done in 05/2005  . Polysubstance abuse     including codiene, hydrocodone, tobacco and adderal    Past Surgical History  Procedure Date  . Abdominal hysterectomy     partial hysterecotmy 2003 for endoemtriosis with residula right ovaey. Multiple GYN surgeries for chronic pelvic pain  . Cesarean section 1994    History reviewed. No pertinent family history.  History  Substance Use Topics  . Smoking status: Former Games developer  . Smokeless tobacco: Former Neurosurgeon    Quit date: 11/27/2008   Comment: quit 23yrs ago  . Alcohol Use: No    OB History    Grav Para Term Preterm Abortions TAB SAB Ect Mult Living                  Review of Systems  Constitutional: Positive for fever (on and off) and fatigue.  HENT: Negative for sore throat and rhinorrhea.   Eyes: Negative for redness.  Respiratory: Negative for cough and shortness of breath.   Cardiovascular: Negative for chest pain.  Gastrointestinal: Positive  for nausea, vomiting, abdominal pain, blood in stool (+ fecal occult blood stool cards) and hematochezia. Negative for heartburn, diarrhea, anorexia and hematemesis.  Genitourinary: Negative for dysuria, frequency, hematuria and vaginal discharge.  Musculoskeletal: Negative for myalgias and back pain.  Skin: Negative for rash.  Neurological: Negative for headaches.    Allergies  Onion; Amoxicillin-pot clavulanate; Cefuroxime; Cephalexin; Ciprofloxacin; Clarithromycin; Clarithromycin; Doxycycline; Other; Prednisone; Shrimp  flavor; Sulfonamide derivatives; and Tramadol hcl  Home Medications   Current Outpatient Rx  Name Route Sig Dispense Refill  . ACETAMINOPHEN 500 MG PO TABS Oral Take 500 mg by mouth every 6 (six) hours as needed. For pain    . VITAMIN C PO Oral Take 1 tablet by mouth daily.    Marland Kitchen VITAMIN D PO Oral Take 1 tablet by mouth daily.    Marland Kitchen VITAMIN B 12 PO Oral Take 1 tablet by mouth daily.    . IBUPROFEN 200 MG PO TABS Oral Take 400 mg by mouth every 6 (six) hours as needed. For pain.    Marland Kitchen LEVOTHYROXINE SODIUM 25 MCG PO TABS Oral Take 1 tablet (25 mcg total) by mouth daily. 31 tablet 11  . LO LOESTRIN FE PO Oral Take 1 tablet by mouth daily.     Marland Kitchen FISH OIL PO Oral Take 2 capsules by mouth daily.      BP 123/84  Pulse 88  Resp 18  SpO2 98%  LMP 11/28/2003  Physical Exam  Nursing note and vitals reviewed. Constitutional: She appears well-developed and well-nourished.  HENT:  Head: Normocephalic and atraumatic.  Eyes: Conjunctivae are normal. Right eye exhibits no discharge. Left eye exhibits no discharge.  Neck: Normal range of motion. Neck supple.  Cardiovascular: Normal rate, regular rhythm and normal heart sounds.   Pulmonary/Chest: Effort normal and breath sounds normal.  Abdominal: Soft. There is tenderness in the right upper quadrant, epigastric area and periumbilical area. There is no rigidity, no rebound, no guarding, no CVA tenderness, no tenderness at McBurney's point and negative Murphy's sign.    Neurological: She is alert.  Skin: Skin is warm and dry.  Psychiatric: She has a normal mood and affect.    ED Course  Procedures (including critical care time)   Labs Reviewed  CBC  DIFFERENTIAL  LIPASE, BLOOD  COMPREHENSIVE METABOLIC PANEL  URINALYSIS, ROUTINE W REFLEX MICROSCOPIC  POCT PREGNANCY, URINE   Dg Abd 1 View  02/29/2012  *RADIOLOGY REPORT*  Clinical Data: Abdominal pain, nausea and vomiting  ABDOMEN - 1 VIEW  Comparison: Ultrasound of the abdomen of  02/29/2012  Findings: A supine view of the abdomen shows no bowel obstruction. A moderate amount of feces is noted throughout the colon.  Mild small bowel gas is present without distention.  No opaque calculi are seen.  IMPRESSION: No bowel obstruction.  Moderate amount of feces in the colon.  Original Report Authenticated By: Juline Patch, M.D.   US Abdomen Complete  02/29/2012  *RADIOLOGY REPORT*  Clinical Data:  Abdominal pain.  Nausea.  COMPLETE ABDOMINAL ULTRASOUND  Comparison:  None.  Findings:  Gallbladder:  Partially contracted.  No stones or wall thickening. Negative for sonographic Murphy's sign.  Common bile duct:  2.2 mm, normal.  Liver:  No focal lesion identified.  Within normal limits in parenchymal echogenicity.  IVC:  Appears normal.  Pancreas:  Tail obscured by bowel gas.  Visualized portions of the pancreas appear normal.  Spleen:  6 cm, normal echotexture.  Right Kidney:  11.2 cm. Normal echotexture.  Normal central sinus echo complex.  No calculi or hydronephrosis.  Left Kidney:  12.1 cm. Normal echotexture.  Normal central sinus echo complex.  No calculi or hydronephrosis.  Abdominal aorta:  2.1 cm.  IMPRESSION: Negative abdominal ultrasound.  Original Report Authenticated By: Andreas Newport, M.D.     1. Abdominal pain     11:14 AM Patient seen and examined. Work-up initiated. Medications ordered.   Vital signs reviewed and are as follows: Filed Vitals:   02/29/12 1017  BP: 123/84  Pulse: 88  Resp: 18   Work-up was unconcerning. Patient was improved with pain medication and was tolerating PO's. She agrees with PCP follow-up for further evaluation.   The patient was urged to return to the Emergency Department immediately with worsening of current symptoms, worsening abdominal pain, persistent vomiting, blood noted in stools, fever, or any other concerns. The patient verbalized understanding.   Patient counseled on use of narcotic pain medications. Counseled not to combine  these medications with others containing tylenol. Urged not to drink alcohol, drive, or perform any other activities that requires focus while taking these medications. The patient verbalizes understanding and agrees with the plan.   MDM  Abdominal pain -- unclear etiology. [place] neg for gallstones. Work-up does not show any abnormalities. Do not suspect any life threatening or dangerous etiologies at this point. PCP follow-up for further eval. Strict return instructions given.         Renne Crigler, [place] 02/29/12 [phone]

## 2012-03-03 MED ORDER — ALPRAZOLAM 0.25 MG PO TABS: 0.2500 mg | ORAL_TABLET | Freq: Three times a day (TID) | ORAL | Status: DC | PRN

## 2012-03-03 NOTE — Telephone Encounter (Signed)
Done  Rx sent

## 2012-03-03 NOTE — ED Provider Notes (Signed)
Medical screening examination/treatment/procedure(s) were performed by non-physician practitioner and as supervising physician I was immediately available for consultation/collaboration.   Othniel Maret, MD 03/03/12 2352 

## 2012-03-04 ENCOUNTER — Other Ambulatory Visit (HOSPITAL_COMMUNITY): Payer: PRIVATE HEALTH INSURANCE

## 2012-03-11 ENCOUNTER — Ambulatory Visit (INDEPENDENT_AMBULATORY_CARE_PROVIDER_SITE_OTHER): Payer: PRIVATE HEALTH INSURANCE | Admitting: Internal Medicine

## 2012-03-11 ENCOUNTER — Telehealth: Payer: Self-pay | Admitting: *Deleted

## 2012-03-11 ENCOUNTER — Encounter: Payer: Self-pay | Admitting: Internal Medicine

## 2012-03-11 VITALS — BP 126/79 | HR 73 | Temp 97.3°F | Ht 60.0 in | Wt 183.0 lb

## 2012-03-11 DIAGNOSIS — R109 Unspecified abdominal pain: Secondary | ICD-10-CM

## 2012-03-11 DIAGNOSIS — R112 Nausea with vomiting, unspecified: Secondary | ICD-10-CM

## 2012-03-11 DIAGNOSIS — A0472 Enterocolitis due to Clostridium difficile, not specified as recurrent: Secondary | ICD-10-CM

## 2012-03-11 LAB — BASIC METABOLIC PANEL WITH GFR
BUN: 6 mg/dL (ref 6–23)
Chloride: 102 mEq/L (ref 96–112)
GFR, Est African American: >89 mL/min
GFR, Est Non African American: >89 mL/min
Potassium: 3.9 mEq/L (ref 3.5–5.3)
Sodium: 140 mEq/L (ref 135–145)

## 2012-03-11 MED ORDER — METRONIDAZOLE 500 MG PO TABS: 500.0000 mg | ORAL_TABLET | Freq: Three times a day (TID) | ORAL | Status: AC

## 2012-03-11 MED ORDER — METRONIDAZOLE 500 MG PO TABS: 500.0000 mg | ORAL_TABLET | Freq: Three times a day (TID) | ORAL | Status: DC

## 2012-03-11 MED ORDER — OXYCODONE-ACETAMINOPHEN 5-325 MG PO TABS: 1.0000 | ORAL_TABLET | Freq: Three times a day (TID) | ORAL | Status: DC | PRN

## 2012-03-11 MED ORDER — ONDANSETRON 4 MG PO TBDP: 4.0000 mg | ORAL_TABLET | Freq: Three times a day (TID) | ORAL | Status: AC | PRN

## 2012-03-11 NOTE — Patient Instructions (Signed)
1. your symptoms are likely due to C. Diff colitis. We will do C .diff PCR test and treat you empirically with Flagyl while pending the test. If the test comes back positive, we will continue Flagyl. If the test comes back negative for C. difficile, we will do further workup for you. Please come back to clinic in 2 weeks. 2. Please take all medications as prescribed.  3. If you have worsening of your symptoms or new symptoms arise, please call the clinic ([phone]), or go to the ER immediately if symptoms are severe.

## 2012-03-11 NOTE — Telephone Encounter (Signed)
Pt walked into [facility] - severe pain area under breast started early this AM with nausea.Diarrhea is out of control today and rectum is bleeding today. Appt made with Dr Clyde Lundborg today 3:45PM. Pt is lying in exam room 19 till she is ready to be seen. Mamie is aware. Interpreter has been called. Stanton Kidney Ervey Fallin RN 03/11/12 1:50PM

## 2012-03-11 NOTE — Progress Notes (Signed)
Subjective:   Patient ID: Donna Davis female   DOB: 06-21-1971 41 y.o.   MRN: [id number]  HPI: [name] is a 41 y.o. with a past medical history of hypothyroidism, anxiety, depression, IBS, and a remote history of C. Difficile, who presents with a nausea, vomiting and abdominal pain.  Patient reports that she started having abdominal pain, nausea and vomiting 3 weeks ago. Her abdominal pain is located at right upper quadrant, 10 out of 10 in severity. It is associated with a nausea and vomiting. She vomited food materials without any blood in it. She tried some over-the-counter medications at home without significant relief. At 02/25/12, patient visited our clinic. She was tested for FOBT, which came back positive in two out of three cards. She was treated symptomatically with Tylenol without significant improvement in her abdominal pain. At 02/29/12, patient visited ED due to worsening of nausea, vomiting and abdominal pain. The patient had negative work up in ED, including abdominal x-ray, abdominal ultrasound, lipase, CMP, urinalysis, CBC and a pregnancy test. She was discharged home with some pain medication and instructed to followup in clinic. Today patient has worsening nausea, vomiting and abdominal pain. Patient's abdominal pain is 10 out of 10 in severity. She vomited several times today. She has watery diarrhea which is very smelly. She also noticed  blood in her stool. She has a subjective fever and chills.  Of note, patient used antibiotics due to dental problem before her abdominal pain started. She said she took antibiotics for at least 7 days, but she doesn't remember the name of antibiotics.  Patient denies headache, chest pain, cough, shortness of breath, joint pain and rashes.   Past Medical History  Diagnosis Date  . GERD (gastroesophageal reflux disease)   . Bipolar affective   . Hypothyroidism   . IBS (irritable bowel syndrome)   . Anxiety   . Hearing difficulty    b/l, 2/2 congenital rubella s/p stapedectomy. Using hearing aid  . Headache 04/2005    h/o headache in past that raised question of psuedotumor s/p therapeutic LP with an opening preassure of 18 cm H2O   . Hepatic cyst     6 mm on CT done done in 05/2005  . Polysubstance abuse     including codiene, hydrocodone, tobacco and adderal   Current Outpatient Prescriptions  Medication Sig Dispense Refill  . acetaminophen (TYLENOL) 500 MG tablet Take 500 mg by mouth every 6 (six) hours as needed. For pain      . ALPRAZolam (XANAX) 0.25 MG tablet Take 1 tablet (0.25 mg total) by mouth 3 (three) times daily as needed for anxiety.  30 tablet  1  . Ascorbic Acid (VITAMIN C PO) Take 1 tablet by mouth daily.      . Cholecalciferol (VITAMIN D PO) Take 1 tablet by mouth daily.      . Cyanocobalamin (VITAMIN B 12 PO) Take 1 tablet by mouth daily.      Marland Kitchen levothyroxine (SYNTHROID, LEVOTHROID) 25 MCG tablet Take 1 tablet (25 mcg total) by mouth daily.  31 tablet  11  . Norethin-Eth Estrad-Fe Biphas (LO LOESTRIN FE PO) Take 1 tablet by mouth daily.       . Omega-3 Fatty Acids (FISH OIL PO) Take 2 capsules by mouth daily.      Marland Kitchen oxyCODONE-acetaminophen (PERCOCET) 5-325 MG per tablet Take 1 tablet by mouth every 8 (eight) hours as needed.  30 tablet  0  . metroNIDAZOLE (FLAGYL) 500 MG tablet  Take 1 tablet (500 mg total) by mouth 3 (three) times daily.  30 tablet  0  . ondansetron (ZOFRAN ODT) 4 MG disintegrating tablet Take 1 tablet (4 mg total) by mouth every 8 (eight) hours as needed for nausea.  24 tablet  0   No family history on file. History   Social History  . Marital Status: Married    Spouse Name: N/A    Number of Children: N/A  . Years of Education: N/A   Social History Main Topics  . Smoking status: Former Games developer  . Smokeless tobacco: Former Neurosurgeon    Quit date: 11/27/2008   Comment: quit 98yrs ago  . Alcohol Use: No  . Drug Use: No  . Sexually Active: None   Other Topics Concern  . None    Social History Narrative   Current smoker, not using alcohol or drugs at this timeLives with husband and 2 children   Review of Systems: Constitutional: has  fever, chills HEENT: Denies photophobia, eye pain, redness, hearing loss, ear pain, congestion, sore throat, rhinorrhea, sneezing, mouth sores, trouble swallowing, neck pain, neck stiffness and tinnitus.   Respiratory: Denies SOB, DOE, cough, chest tightness,  and wheezing.   Cardiovascular: Denies chest pain, palpitations and leg swelling.  Gastrointestinal: has nausea, vomiting, abdominal pain, diarrhea, blood in stool. Genitourinary: Denies dysuria, urgency, frequency, hematuria, flank pain and difficulty urinating.  Musculoskeletal: Denies myalgias, back pain, joint swelling, arthralgias and gait problem.  Skin: Denies pallor, rash and wound.  Neurological: Denies dizziness, seizures, syncope, weakness, light-headedness, numbness and headaches.  Hematological: Denies adenopathy. Easy bruising, personal or family bleeding history  Psychiatric/Behavioral: Denies suicidal ideation, mood changes, confusion, nervousness, sleep disturbance and agitation  Objective:  Physical Exam: Filed Vitals:   03/11/12 1541  BP: 126/79  Pulse: 73  Temp: 97.3 F (36.3 C)  TempSrc: Oral  Height: 5' (1.524 m)  Weight: 183 lb (83.008 kg)    General: resting in bed, not in acute distress HEENT: PERRL, EOMI, no scleral icterus Cardiac: S1/S2, RRR, No murmurs, gallops or rubs Pulm: Good air movement bilaterally, Clear to auscultation bilaterally, No rales, wheezing, rhonchi or rubs. Abd: Soft,  nodistended, tender over RUQ, no rebound pain, no organomegaly, BS present. Questionable Murphy sign. Ext: No rashes or edema, 2+DP/PT pulse bilaterally Musculoskeletal: No joint deformities, erythema, or stiffness, ROM full and nontender Skin: no rashes. No skin bruise. Neuro: alert and oriented X3, cranial nerves II-XII grossly intact, muscle strength  5/5 in all extremeties,  sensation to light touch intact.  Psych.: patient is not psychotic, no suicidal or hemocidal ideation.    Assessment & Plan:   # Nausea, vomiting and abdominal pain: It is likely due to C. difficile colitis. Patient's symptoms started after using antibiotics. She had a history of C. difficile after using antibiotics in remote past. FOBT is positive in 2/3 cards. Other differential diagnoses include, but less likely, cholecystitis(negative findings on abdominal ultrasound), pancreatitis (Lipase negative), UTI or kidney stone (urinalysis negative). Patient doesn't seem to have acute abdomen given no guarding and rebound pain on physical examination.  Plan - Will get C. difficile PCR test - Will treat patient's empirically with Flagyl for 10 days,  500 mg Q8h.  - Will give Zofran for nausea and Percocet for abdominal pain - If C. difficile PCR test comes back positive, we'll continue Flagyl treatment. If it is negative, will do further workup and treat patient accordingly.  Lorretta Harp, MD PGY1, Internal Medicine Teaching Service Pager: [phone]

## 2012-03-14 ENCOUNTER — Other Ambulatory Visit: Payer: PRIVATE HEALTH INSURANCE

## 2012-03-14 ENCOUNTER — Encounter: Payer: Self-pay | Admitting: *Deleted

## 2012-03-14 DIAGNOSIS — R1084 Generalized abdominal pain: Secondary | ICD-10-CM

## 2012-03-14 NOTE — Progress Notes (Unsigned)
Pt stopped by the clinic and wanted you to know the antibiotic she was taking was clindamycin 150 mg. You asked for this information. She dropped of stool spec. For C/diff today.

## 2012-03-17 LAB — OVA AND PARASITE SCREEN: OP: NONE SEEN

## 2012-03-18 ENCOUNTER — Telehealth: Payer: Self-pay | Admitting: *Deleted

## 2012-03-18 ENCOUNTER — Other Ambulatory Visit: Payer: Self-pay | Admitting: Internal Medicine

## 2012-03-18 DIAGNOSIS — R109 Unspecified abdominal pain: Secondary | ICD-10-CM

## 2012-03-18 NOTE — Telephone Encounter (Signed)
Pt called to get the results of her C/diff test. She states she still has nausea, and rt side pain under breast.  The pain is stabbing( this is not new, onset 4 weeks ago,& constant. )  She is taking all prescribed meds for this without relief.  Pain meds helps a little.  Also reports a Low grade temp. Patient is not eating or drinking much.  Able to keep Gatorade down. Pt was seen in clinic for same complaints on 6/25. Pt # [id number] ( video phone)  Please advise, we have no openings in clinic this week.

## 2012-03-18 NOTE — Telephone Encounter (Signed)
I talked with Dr Meredith Pel and Dr Clyde Lundborg about this pt.  I called pt back and instructed her to stop taking flagyl. Appointment given for GI MD, Dr Jarold Motto on 7/18. Also appointment given with Dr Clyde Lundborg on 7/9 in clinic for f/u.   Pt instructed to go to ED if symptoms get worse or any changes.  Pt voices understanding.

## 2012-03-18 NOTE — Telephone Encounter (Signed)
  Patient's C. diff PCR is negative for C. difficile colitis. I discussed the CT with Dr. Meredith Pel. Gambaccini, Inocencio Homes called the patient through translator to have informed the patient that her abdominal pain is not due to C. difficile. Patient was given a referral to gastroenterologist, appointment will be on 7/18. She was also given an appointment to see me on 7/9 in clinic for followup. Pt is instructed to go to ED if symptoms get worse.  Lorretta Harp, MD PGY1, Internal Medicine Teaching Service Pager: [phone]

## 2012-03-25 ENCOUNTER — Encounter: Payer: Self-pay | Admitting: Internal Medicine

## 2012-03-25 ENCOUNTER — Ambulatory Visit (INDEPENDENT_AMBULATORY_CARE_PROVIDER_SITE_OTHER): Payer: PRIVATE HEALTH INSURANCE | Admitting: Internal Medicine

## 2012-03-25 VITALS — BP 123/83 | HR 81 | Temp 99.7°F | Ht 60.0 in | Wt 187.6 lb

## 2012-03-25 DIAGNOSIS — E039 Hypothyroidism, unspecified: Secondary | ICD-10-CM

## 2012-03-25 DIAGNOSIS — R1084 Generalized abdominal pain: Secondary | ICD-10-CM

## 2012-03-25 DIAGNOSIS — F419 Anxiety disorder, unspecified: Secondary | ICD-10-CM

## 2012-03-25 DIAGNOSIS — F411 Generalized anxiety disorder: Secondary | ICD-10-CM

## 2012-03-25 MED ORDER — ALPRAZOLAM 0.25 MG PO TABS: 0.2500 mg | ORAL_TABLET | Freq: Every evening | ORAL | Status: DC | PRN

## 2012-03-25 NOTE — Assessment & Plan Note (Addendum)
It is most likely due to IBS. Patient had history of IBS. She had negative work up recently at ED and at our clinic, including C. Diff PCR,  abdominal x-ray, abdominal ultrasound, lipase, CMP, urinalysis, CBC and a pregnancy test. Currently patient's symptoms improved significantly. She has only mild abdominal pain and nausea. She has an appointment with gastroenterology at 04/21/12. Will not add any new medications today. Will follow up.

## 2012-03-25 NOTE — Assessment & Plan Note (Signed)
She is currently on Synthroid. Her last TSH and free T4 was 1 year ago. He reports feeling tired all the time, which might be secondary to her recent sickness 2/2 abdominal pain. It might be also due to low thyroid function. Will check her TSH today.

## 2012-03-25 NOTE — Assessment & Plan Note (Signed)
Patient reports having anxiety which has been worsening due to recent sickness. Will give her Xanax refill.

## 2012-03-25 NOTE — Progress Notes (Signed)
Patient ID: Donna Davis, female   DOB: [birth year], 41 y.o.   MRN: [id number]  Subjective:   Patient ID: Donna Davis female   DOB: [birth year] 41 y.o.   MRN: [id number]  HPI:  Donna Davis is a 41 y.o. with a past medical history of hypothyroidism, anxiety, depression, IBS, who presents for a followup visit following her recent visit to our clinic. Patient was seen on 6/25 due to nausea, vomiting and abdominal pain at clinic. Patient had abdominal pain, nausea and vomiting for 3 weeks prior to her clinic visit. At 02/25/12, patient visited our clinic. She was tested for FOBT, which came back positive in two out of three cards. She was treated symptomatically with Tylenol without significant improvement in her abdominal pain. At 02/29/12, patient visited ED due to worsening of nausea, vomiting and abdominal pain. The patient had negative work up in ED, including abdominal x-ray, abdominal ultrasound, lipase, CMP, urinalysis, CBC and a pregnancy test. She was discharged home with some pain medication and instructed to followup in clinic. When we saw patient at clinic at 6/25, we suspected that patient had C diff colitis. We ordered C diff PCR test and at the same time,  put her on flagyl empirically. After C. diff test came back negative. Her flagyl was discontinued. Today, patient reports that her symptoms improved significantly. She has mild abdominal pain (2/10 in severity) and mild nausea. She doesn't have vomiting or diarrhea. She has an appointment with gastroenterologist at 04/21/12.   Past Medical History  Diagnosis Date  . GERD (gastroesophageal reflux disease)   . Bipolar affective   . Hypothyroidism   . IBS (irritable bowel syndrome)   . Anxiety   . Hearing difficulty     b/l, 2/2 congenital rubella s/p stapedectomy. Using hearing aid  . Headache 04/2005    h/o headache in past that raised question of psuedotumor s/p therapeutic LP with an opening preassure of 18 cm H2O   . Hepatic cyst     6  mm on CT done done in 05/2005  . Polysubstance abuse     including codiene, hydrocodone, tobacco and adderal   Current Outpatient Prescriptions  Medication Sig Dispense Refill  . acetaminophen (TYLENOL) 500 MG tablet Take 500 mg by mouth every 6 (six) hours as needed. For pain      . ALPRAZolam (XANAX) 0.25 MG tablet Take 1 tablet (0.25 mg total) by mouth 3 (three) times daily as needed for anxiety.  30 tablet  1  . Ascorbic Acid (VITAMIN C PO) Take 1 tablet by mouth daily.      . Cholecalciferol (VITAMIN D PO) Take 1 tablet by mouth daily.      . Cyanocobalamin (VITAMIN B 12 PO) Take 1 tablet by mouth daily.      Marland Kitchen levothyroxine (SYNTHROID, LEVOTHROID) 25 MCG tablet Take 1 tablet (25 mcg total) by mouth daily.  31 tablet  11  . Norethin-Eth Estrad-Fe Biphas (LO LOESTRIN FE PO) Take 1 tablet by mouth daily.       . Omega-3 Fatty Acids (FISH OIL PO) Take 2 capsules by mouth daily.      Marland Kitchen oxyCODONE-acetaminophen (PERCOCET) 5-325 MG per tablet Take 1 tablet by mouth every 8 (eight) hours as needed.  30 tablet  0   No family history on file. History   Social History  . Marital Status: Married    Spouse Name: N/A    Number of Children: N/A  . Years of Education:  N/A   Social History Main Topics  . Smoking status: Former Games developer  . Smokeless tobacco: Former Neurosurgeon    Quit date: 11/27/2008   Comment: quit 54yrs ago  . Alcohol Use: No  . Drug Use: No  . Sexually Active: None   Other Topics Concern  . None   Social History Narrative   Current smoker, not using alcohol or drugs at this timeLives with husband and 2 children   Review of Systems:  General: no fevers, chills, no changes in body weight, no changes in appetite Skin: no rash HEENT: no blurry vision, hearing changes or sore throat Pulm: no dyspnea, coughing, wheezing CV: no chest pain, palpitations, shortness of breath Abd: has mild nausea and abdominal pain. No vomiting, diarrhea/constipation GU: no dysuria, hematuria,  polyuria Ext: no arthralgias, myalgias Neuro: no weakness, numbness, or tingling  Objective:  Physical Exam: Filed Vitals:   03/25/12 1630  BP: 123/83  Pulse: 81  Temp: 99.7 F (37.6 C)  TempSrc: Oral  Height: 5' (1.524 m)  Weight: 187 lb 9.6 oz (85.095 kg)   general: resting in bed, not in acute distress HEENT: PERRL, EOMI, no scleral icterus Cardiac: S1/S2, RRR, No murmurs, gallops or rubs Pulm: Good air movement bilaterally, Clear to auscultation bilaterally, No rales, wheezing, rhonchi or rubs. Abd: Soft,  nondistended, nontender, no rebound pain, no organomegaly, BS present Ext: No rashes or edema, 2+DP/PT pulse bilaterally Musculoskeletal: No joint deformities, erythema, or stiffness, ROM full and nontender Skin: no rashes. No skin bruise. Neuro: alert and oriented X3, cranial nerves II-XII grossly intact, muscle strength 5/5 in all extremeties,  sensation to light touch intact.  Psych.: patient is not psychotic, no suicidal or hemocidal ideation.   Assessment & Plan:

## 2012-03-25 NOTE — Patient Instructions (Signed)
1. Please keep your appointment with gastroenterologist for further evaluation of abdominal pain. 2. Please take all medications as prescribed.  3. If you have worsening of your symptoms or new symptoms arise, please call the clinic ([phone]), or go to the ER immediately if symptoms are severe.

## 2012-03-26 ENCOUNTER — Other Ambulatory Visit (INDEPENDENT_AMBULATORY_CARE_PROVIDER_SITE_OTHER): Payer: PRIVATE HEALTH INSURANCE

## 2012-03-26 ENCOUNTER — Encounter: Payer: Self-pay | Admitting: Internal Medicine

## 2012-03-26 DIAGNOSIS — E039 Hypothyroidism, unspecified: Secondary | ICD-10-CM

## 2012-03-26 LAB — TSH: TSH: 2.038 u[IU]/mL (ref 0.350–4.500)

## 2012-03-27 ENCOUNTER — Encounter: Payer: Self-pay | Admitting: Internal Medicine

## 2012-04-03 ENCOUNTER — Ambulatory Visit: Payer: PRIVATE HEALTH INSURANCE | Admitting: Gastroenterology

## 2012-04-17 ENCOUNTER — Encounter: Payer: Self-pay | Admitting: *Deleted

## 2012-04-21 ENCOUNTER — Other Ambulatory Visit (INDEPENDENT_AMBULATORY_CARE_PROVIDER_SITE_OTHER): Payer: PRIVATE HEALTH INSURANCE

## 2012-04-21 ENCOUNTER — Encounter: Payer: Self-pay | Admitting: Gastroenterology

## 2012-04-21 ENCOUNTER — Ambulatory Visit (INDEPENDENT_AMBULATORY_CARE_PROVIDER_SITE_OTHER): Payer: PRIVATE HEALTH INSURANCE | Admitting: Gastroenterology

## 2012-04-21 ENCOUNTER — Ambulatory Visit: Payer: PRIVATE HEALTH INSURANCE | Admitting: Gastroenterology

## 2012-04-21 VITALS — BP 116/60 | HR 80 | Ht 61.5 in | Wt 183.0 lb

## 2012-04-21 DIAGNOSIS — K219 Gastro-esophageal reflux disease without esophagitis: Secondary | ICD-10-CM

## 2012-04-21 DIAGNOSIS — R197 Diarrhea, unspecified: Secondary | ICD-10-CM

## 2012-04-21 DIAGNOSIS — H9190 Unspecified hearing loss, unspecified ear: Secondary | ICD-10-CM

## 2012-04-21 LAB — COMPREHENSIVE METABOLIC PANEL
BUN: 10 mg/dL (ref 6–23)
CO2: 24 mEq/L (ref 19–32)
Calcium: 9.3 mg/dL (ref 8.4–10.5)
Chloride: 106 mEq/L (ref 96–112)
Creatinine, Ser: 0.7 mg/dL (ref 0.4–1.2)
GFR: 93.81 mL/min (ref >60.00)
Glucose, Bld: 83 mg/dL (ref 70–99)

## 2012-04-21 LAB — CBC WITH DIFFERENTIAL/PLATELET
Basophils Absolute: 0 10*3/uL (ref 0.0–0.1)
Basophils Relative: 0.2 % (ref 0.0–3.0)
Hemoglobin: 13.3 g/dL (ref 12.0–15.0)
Lymphocytes Relative: 31 % (ref 12.0–46.0)
Monocytes Relative: 5.9 % (ref 3.0–12.0)
Neutro Abs: 4.5 10*3/uL (ref 1.4–7.7)
RBC: 4.18 Mil/uL (ref 3.87–5.11)

## 2012-04-21 LAB — SEDIMENTATION RATE: Sed Rate: 29 mm/hr — ABNORMAL HIGH (ref 0–22)

## 2012-04-21 LAB — C-REACTIVE PROTEIN: CRP: 2 mg/dL (ref 1–20)

## 2012-04-21 MED ORDER — NA SULFATE-K SULFATE-MG SULF 17.5-3.13-1.6 GM/177ML PO SOLN: ORAL | Status: DC

## 2012-04-21 MED ORDER — HYOSCYAMINE-PHENYLTOLOXAMINE 0.0625-15 MG PO CAPS: 1.0000 | ORAL_CAPSULE | Freq: Two times a day (BID) | ORAL | Status: DC

## 2012-04-21 NOTE — Patient Instructions (Addendum)
You have been scheduled for an endoscopy and colonoscopy with propofol. Please follow the written instructions given to you at your visit today. Please pick up your prep at the pharmacy within the next 1-3 days. If you use inhalers (even only as needed), please bring them with you on the day of your procedure. We have sent the following medications to your pharmacy for you to pick up at your convenience: Digex (we have given you samples to take 1 tablet twice daily of this as well) Your physician has requested that you go to the basement for the following lab work before leaving today: [place] [facility] Panel, CRP, Sedimentation Rate, Celiac 10 Panel CC: Dr Bethel Born

## 2012-04-21 NOTE — Progress Notes (Signed)
History of Present Illness:  This is a very complex 41 year old Caucasian female who presents with several months of crampy abdominal pain and watery diarrhea of unexplained etiology. She does occasionally have bright red blood per rectum, and has been on several courses of antibiotics over the past 6 months. Previous evaluation by primary care showed a negative C. difficile toxin and ova and parasite exam. The patient is deaf, and had an interpreter present throughout the interview and exam. She denies infectious disease exposure otherwise, travel, history of hepatitis, pancreatitis, alcohol, cigarette, or NSAID use. She does complain of extreme flatus, some acid reflux symptoms, but no real systemic complaints. She has a strong family history of colon cancer in her father. Her last colonoscopy exam was with Dr. Russella Dar in March of 2008. Previous endoscopy with Dr. Juanda Chance was unremarkable. Because of a right upper quadrant pain she had ultrasound of her abdomen in June, and this was unremarkable.  I have reviewed this patient's present history, medical and surgical past history, allergies and medications.     ROS: The remainder of the 10 point ROS is negative     Physical Exam: Blood pressure 116/60, pulse 80 and regular, weight 183 pounds the BMI of 34.82. I cannot appreciate stigmata of chronic liver disease. General well developed well nourished patient in no acute distress, appearing their stated age Eyes PERRLA, no icterus, fundoscopic exam per opthamologist Skin no lesions noted Neck supple, no adenopathy, no thyroid enlargement, no tenderness Chest clear to percussion and auscultation Heart no significant murmurs, gallops or rubs noted Abdomen no hepatosplenomegaly masses or tenderness, BS normal.  Rectal inspection normal no fissures, or fistulae noted.  No masses or tenderness on digital exam. Stool guaiac negative. Extremities no acute joint lesions, edema, phlebitis or evidence of  cellulitis. Neurologic patient oriented x 3, cranial nerves intact, no focal neurologic deficits noted. Psychological mental status normal and normal affect.  Assessment and plan: several months of abdominal cramping and diarrhea, rule out inflammatory colitis, microscopic colitis, versus severe IBS. I placed her on Digex anti-spasmodic twice a day pending lab examination and colonoscopy. She may be a good candidate for Lotronex therapy. I also scheduled repeat endoscopy with propofol sedation, and will obtain small bowel biopsy. Throughout her examination procedure her interpreter was present because of her deafness.  Encounter Diagnoses  Name Primary?  . Diarrhea Yes  . GERD (gastroesophageal reflux disease)

## 2012-04-22 LAB — CELIAC PANEL 10
Gliadin IgG: 4.5 U/mL (ref <20)
IgA: 204 mg/dL (ref 69–380)
Tissue Transglutaminase Ab, IgA: 4 U/mL (ref <20)

## 2012-04-23 ENCOUNTER — Ambulatory Visit (AMBULATORY_SURGERY_CENTER): Payer: PRIVATE HEALTH INSURANCE | Admitting: Gastroenterology

## 2012-04-23 ENCOUNTER — Encounter: Payer: Self-pay | Admitting: Gastroenterology

## 2012-04-23 VITALS — BP 141/82 | HR 79 | Temp 98.3°F | Resp 18 | Ht 61.5 in | Wt 183.0 lb

## 2012-04-23 DIAGNOSIS — D126 Benign neoplasm of colon, unspecified: Secondary | ICD-10-CM

## 2012-04-23 DIAGNOSIS — Z1211 Encounter for screening for malignant neoplasm of colon: Secondary | ICD-10-CM

## 2012-04-23 DIAGNOSIS — D1330 Benign neoplasm of unspecified part of small intestine: Secondary | ICD-10-CM

## 2012-04-23 DIAGNOSIS — K219 Gastro-esophageal reflux disease without esophagitis: Secondary | ICD-10-CM

## 2012-04-23 DIAGNOSIS — R1084 Generalized abdominal pain: Secondary | ICD-10-CM

## 2012-04-23 DIAGNOSIS — K589 Irritable bowel syndrome without diarrhea: Secondary | ICD-10-CM

## 2012-04-23 DIAGNOSIS — R52 Pain, unspecified: Secondary | ICD-10-CM

## 2012-04-23 DIAGNOSIS — R197 Diarrhea, unspecified: Secondary | ICD-10-CM

## 2012-04-23 HISTORY — PX: ESOPHAGOGASTRODUODENOSCOPY (EGD) WITH PROPOFOL: SHX5813

## 2012-04-23 HISTORY — PX: COLONOSCOPY WITH PROPOFOL: SHX5780

## 2012-04-23 MED ORDER — OXYCODONE-ACETAMINOPHEN 10-325 MG PO TABS: 1.0000 | ORAL_TABLET | Freq: Four times a day (QID) | ORAL | Status: AC | PRN

## 2012-04-23 MED ORDER — SODIUM CHLORIDE 0.9 % IV SOLN: 500.0000 mL | INTRAVENOUS | Status: DC

## 2012-04-23 NOTE — Progress Notes (Signed)
Patient did not experience any of the following events: a burn prior to discharge; a fall within the facility; wrong site/side/patient/procedure/implant event; or a hospital transfer or hospital admission upon discharge from the facility. (G8907) Patient did not have preoperative order for IV antibiotic SSI prophylaxis. (G8918)  

## 2012-04-23 NOTE — Patient Instructions (Signed)
Findings:  Normal Colon and Normal EGD. Recommendations:  Wait for biopsy results from both procedures.  YOU HAD AN ENDOSCOPIC PROCEDURE TODAY AT THE [facility]: Refer to the procedure report that was given to you for any specific questions about what was found during the examination.  If the procedure report does not answer your questions, please call your gastroenterologist to clarify.  If you requested that your care partner not be given the details of your procedure findings, then the procedure report has been included in a sealed envelope for you to review at your convenience later.  YOU SHOULD EXPECT: Some feelings of bloating in the abdomen. Passage of more gas than usual.  Walking can help get rid of the air that was put into your GI tract during the procedure and reduce the bloating. If you had a lower endoscopy (such as a colonoscopy or flexible sigmoidoscopy) you may notice spotting of blood in your stool or on the toilet paper. If you underwent a bowel prep for your procedure, then you may not have a normal bowel movement for a few days.  DIET: Your first meal following the procedure should be a light meal and then it is ok to progress to your normal diet.  A half-sandwich or bowl of soup is an example of a good first meal.  Heavy or fried foods are harder to digest and may make you feel nauseous or bloated.  Likewise meals heavy in dairy and vegetables can cause extra gas to form and this can also increase the bloating.  Drink plenty of fluids but you should avoid alcoholic beverages for 24 hours.  ACTIVITY: Your care partner should take you home directly after the procedure.  You should plan to take it easy, moving slowly for the rest of the day.  You can resume normal activity the day after the procedure however you should NOT DRIVE or use heavy machinery for 24 hours (because of the sedation medicines used during the test).    SYMPTOMS TO REPORT IMMEDIATELY: A  gastroenterologist can be reached at any hour.  During normal business hours, 8:30 AM to 5:00 PM Monday through Friday, call [phone].  After hours and on weekends, please call the GI answering service at [phone] who will take a message and have the physician on call contact you.   Following lower endoscopy (colonoscopy or flexible sigmoidoscopy):  Excessive amounts of blood in the stool  Significant tenderness or worsening of abdominal pains  Swelling of the abdomen that is new, acute  Fever of 100F or higher  Following upper endoscopy (EGD)  Vomiting of blood or coffee ground material  New chest pain or pain under the shoulder blades  Painful or persistently difficult swallowing  New shortness of breath  Fever of 100F or higher  Black, tarry-looking stools  FOLLOW UP: If any biopsies were taken you will be contacted by phone or by letter within the next 1-3 weeks.  Call your gastroenterologist if you have not heard about the biopsies in 3 weeks.  Our staff will call the home number listed on your records the next business day following your procedure to check on you and address any questions or concerns that you may have at that time regarding the information given to you following your procedure. This is a courtesy call and so if there is no answer at the home number and we have not heard from you through the emergency physician on call, we will assume  that you have returned to your regular daily activities without incident.  SIGNATURES/CONFIDENTIALITY: You and/or your care partner have signed paperwork which will be entered into your electronic medical record.  These signatures attest to the fact that that the information above on your After Visit Summary has been reviewed and is understood.  Full responsibility of the confidentiality of this discharge information lies with you and/or your care-partner.  Please follow all discharge instructions given to you  by the recovery room nurse. If you have any questions or problems after discharge please call one of the numbers listed above. You will receive a phone call in the am to see how you are doing and answer any questions you may have. Thank you for choosing [facility] for your health care needs. 

## 2012-04-23 NOTE — Progress Notes (Signed)
PROPOFOL PER S CAMP CRNA, SEE SCANNED INTRA PROCEDURE REPORT.ALL MEDICINES TITRATED PER CRNA FOR PROCEDURES. EWM

## 2012-04-23 NOTE — Op Note (Signed)
[facility] [address] [place], [place]  [address]  COLONOSCOPY PROCEDURE REPORT  PATIENT:  Donna, Davis  MR#:  [id number] BIRTHDATE:  [birth year], 41 yrs. old  GENDER:  female ENDOSCOPIST:  Vania Rea. Jarold Motto, MD, [facility] REF. BY: PROCEDURE DATE:  04/23/2012 PROCEDURE:  Colonoscopy with biopsy ASA CLASS:  Class II INDICATIONS:  Colorectal cancer screening, average risk, unexplained diarrhea MEDICATIONS:   propofol (Diprivan) 300 mg IV  DESCRIPTION OF PROCEDURE:   After the risks and benefits and of the procedure were explained, informed consent was obtained. Digital rectal exam was performed and revealed no abnormalities. The LB [id number] endoscope was introduced through the anus and advanced to the ileum.  The quality of the prep was excellent, using MoviPrep.  The instrument was then slowly withdrawn as the colon was fully examined. <<PROCEDUREIMAGES>>  FINDINGS:  No polyps or cancers were seen.  This was otherwise a normal examination of the colon. RANDOM BIOPSIES DONE. Retroflexed views in the rectum revealed it was not tolerated by the patient.  RECTUM TOO SMALL  The scope was then withdrawn from the patient and the procedure completed.  COMPLICATIONS:  None ENDOSCOPIC IMPRESSION: 1) No polyps or cancers 2) Otherwise normal examination R/O MICROSCOPIC/COLLAGENOUS COLITIS RECOMMENDATIONS: 1) Await biopsy results CONSIDER LOTRONEX TRIAL.  REPEAT EXAM:  No  ______________________________ Vania Rea. Jarold Motto, MD, [facility]  CC:  Bethel Born MD  n. [name] at 04/23/2012 03:39 PM  [name], [phone]

## 2012-04-23 NOTE — Progress Notes (Signed)
1541 - Pt received in recovery room and immediately is complaining of pain. 10 on pain scale.  States it hurts on right side under rib cage where it has been hurting prior to procedure.  Bufford Spikes RN and [facility] CRNA both encouraging her to pass flatus.  Moderate amount of flatus passed.  1545 - still complaining of pain, head placed in trendelenburg and encouraging her to continue to pass flatus.  Pt states pain is no better. Dr Jarold Motto explained to pt and family everything looked normal today and he took biopsies and would follow up with her once he received the results.  1552 - Levsin 0.125 mg given to pt sublingual. 1557 pt refuses to keep Levsin under tongue and spits them out.  Still complaining of pain but continues to pass flatus.  1615- She still says pain is a 10 on pain scale and needs something for pain. States she is out of pain meds at home.  Dr Eloise Harman called and orders given to explain the procedures are normal and if she continues to have pain to see her primary care giver.  He ordered percocet to take for pain prn.  He states we can discharge her to home.  Explained orders to pt and care giver and they verbalize understanding.  Pt asked for something to drink.  Tolerated liquids without any problems.  Still continues to pass flatus.  Discharge instructions given to pt and care giver and explained. Instructed tem Dr Jarold Motto has given the go ahead to discharge to home but to inform me when she  Feels she is ready. Explained if pain continues to get worse to call or go to ER. 1430 - Pt states she is ready to go home but is still having pain 6 on pain scale, and continues to pass flatus.  Up to dress without distress.  1437 - discharged home via wheelchair, [facility]  Informed me after transporting her down to the car that pt expelled a large amount of air in bathroom downstairs before getting in car.

## 2012-04-23 NOTE — Progress Notes (Signed)
1557 - pt refusing to let levsin dissolve.  Spit out. Interpreter states her mouth is to dry to dissolve tablets.

## 2012-04-23 NOTE — Op Note (Signed)
[facility] [address] [place], [place]  [address]  ENDOSCOPY PROCEDURE REPORT  PATIENT:  Donna, Davis  MR#:  [id number] BIRTHDATE:  October 02, 1970, 41 yrs. old  GENDER:  female  ENDOSCOPIST:  Vania Rea. Jarold Motto, MD, [facility] Referred by:  PROCEDURE DATE:  04/23/2012 PROCEDURE:  EGD with biopsy, 43239, EGD with biopsy for H. pylori 19147 ASA CLASS:  Class II INDICATIONS:  diarrhea  MEDICATIONS:   There was residual sedation effect present from prior procedure., propofol (Diprivan) 100 mg IV, glycopyrrolate (Robinal) 0.2 mg IV TOPICAL ANESTHETIC:  Cetacaine Spray  DESCRIPTION OF PROCEDURE:   After the risks and benefits of the procedure were explained, informed consent was obtained.  The LB GIF-H180 [id number] endoscope was introduced through the mouth and advanced to the second portion of the duodenum.  The instrument was slowly withdrawn as the mucosa was fully examined. <<PROCEDUREIMAGES>>  The upper, middle, and distal third of the esophagus were carefully inspected and no abnormalities were noted. The z-line was well seen at the GEJ. The endoscope was pushed into the fundus which was normal including a retroflexed view. The antrum,gastric body, first and second part of the duodenum were unremarkable. SMALL BOWEL BIOPSIES AND CLO BX. DONE.    Retroflexed views revealed no abnormalities.    The scope was then withdrawn from the patient and the procedure completed.  COMPLICATIONS:  None  ENDOSCOPIC IMPRESSION: 1) Normal EGD PROBABLE IBS.R/O CELIAC DISEASE RECOMMENDATIONS: 1) Await biopsy results 2) Rx CLO if positive 3) continue current medications  ______________________________ Vania Rea. Jarold Motto, MD, [facility]  CC:  Bethel Born MD  n. Rosalie DoctorVania Rea. Manson Luckadoo at 04/23/2012 03:46 PM  Leonor Liv Jaclin, [phone]

## 2012-04-24 ENCOUNTER — Telehealth: Payer: Self-pay | Admitting: *Deleted

## 2012-04-24 LAB — HELICOBACTER PYLORI SCREEN-BIOPSY: UREASE: NEGATIVE

## 2012-04-24 NOTE — Telephone Encounter (Signed)
No answer left message to call office if questions or concerns. 

## 2012-04-29 ENCOUNTER — Encounter: Payer: Self-pay | Admitting: Gastroenterology

## 2012-05-01 ENCOUNTER — Other Ambulatory Visit: Payer: Self-pay

## 2012-05-01 MED ORDER — HYOSCYAMINE-PHENYLTOLOXAMINE 0.0625-15 MG PO CAPS: 1.0000 | ORAL_CAPSULE | Freq: Two times a day (BID) | ORAL | Status: DC

## 2012-06-06 ENCOUNTER — Encounter (HOSPITAL_COMMUNITY): Payer: Self-pay | Admitting: Emergency Medicine

## 2012-06-06 ENCOUNTER — Emergency Department (HOSPITAL_COMMUNITY)
Admission: EM | Admit: 2012-06-06 | Discharge: 2012-06-06 | Disposition: A | Discharge status: Home / Self Care | Payer: BC Managed Care – PPO | Attending: Emergency Medicine | Admitting: Emergency Medicine

## 2012-06-06 ENCOUNTER — Emergency Department (HOSPITAL_COMMUNITY): Payer: BC Managed Care – PPO

## 2012-06-06 ENCOUNTER — Other Ambulatory Visit: Payer: Self-pay | Admitting: Internal Medicine

## 2012-06-06 DIAGNOSIS — N644 Mastodynia: Secondary | ICD-10-CM | POA: Insufficient documentation

## 2012-06-06 DIAGNOSIS — R079 Chest pain, unspecified: Secondary | ICD-10-CM | POA: Insufficient documentation

## 2012-06-06 DIAGNOSIS — Z79899 Other long term (current) drug therapy: Secondary | ICD-10-CM | POA: Insufficient documentation

## 2012-06-06 DIAGNOSIS — R6883 Chills (without fever): Secondary | ICD-10-CM | POA: Insufficient documentation

## 2012-06-06 LAB — POCT I-STAT TROPONIN I: Troponin i, poc: 0 ng/mL (ref 0.00–0.08)

## 2012-06-06 LAB — CBC
MCH: 31.9 pg (ref 26.0–34.0)
MCHC: 34.8 g/dL (ref 30.0–36.0)
Platelets: 319 10*3/uL (ref 150–400)
RDW: 12 % (ref 11.5–15.5)

## 2012-06-06 LAB — BASIC METABOLIC PANEL
Calcium: 9.8 mg/dL (ref 8.4–10.5)
GFR calc Af Amer: >90 mL/min (ref >90)
GFR calc non Af Amer: >90 mL/min (ref >90)
Sodium: 139 mEq/L (ref 135–145)

## 2012-06-06 MED ORDER — CLINDAMYCIN HCL 300 MG PO CAPS: 300.0000 mg | ORAL_CAPSULE | Freq: Three times a day (TID) | ORAL | Status: DC

## 2012-06-06 MED ORDER — OXYCODONE-ACETAMINOPHEN 5-325 MG PO TABS: 1.0000 | ORAL_TABLET | ORAL | Status: DC | PRN

## 2012-06-06 NOTE — ED Notes (Signed)
Pt c/o breast pain x2weeks, pt states that over past 32 hrs, pain has spread throughout entire breast and goes behind breast, about halfway into back. Pt states entire breast is burning. Pt denies any other radiation of pain. Pt states "I feel like there is an elephant sitting on my breast." Denies any other associated symptoms. Pain is worse with deep palpation. Pt saw MD at [facility] today and MD "thought she felt a lump under left armpit. Pt states she also has had increasing fatigue.

## 2012-06-06 NOTE — ED Provider Notes (Signed)
History     CSN: [id number]  Arrival date & time 06/06/12  1652   First MD Initiated Contact with Patient 06/06/12 1957      Chief Complaint  Patient presents with  . Breast Pain    (Consider location/radiation/quality/duration/timing/severity/associated sxs/prior treatment) HPI Comments: Donna Davis is a 41 y.o. Female who presents to ED with complaint of a left breast pain. Sign language interpreter was used given pt is deaf. States pain began 2 weeks ago, worsened yesterday. States pain is over entire left breast. Breast feels swollen to her, tender. No drainage from the nipple, no skin changes. No injury. States pain with movement of left arm and when put the bra on. States felt feverish, has been taking tylenol and ibuprofen continuously since yesterday.     Past Medical History  Diagnosis Date  . GERD (gastroesophageal reflux disease)   . Bipolar affective     pt denies  . Hypothyroidism   . IBS (irritable bowel syndrome)   . Anxiety   . Hearing difficulty     b/l, 2/2 congenital rubella s/p stapedectomy. Using hearing aid  . Headache 04/2005    h/o headache in past that raised question of psuedotumor s/p therapeutic LP with an opening preassure of 18 cm H2O   . Hepatic cyst     6 mm on CT done done in 05/2005  . Polysubstance abuse     including codiene, hydrocodone, tobacco and adderal  . Family history of malignant neoplasm of gastrointestinal tract     Past Surgical History  Procedure Date  . Abdominal hysterectomy     partial hysterecotmy 2003 for endoemtriosis with residula right ovaey. Multiple GYN surgeries for chronic pelvic pain  . Cesarean section 1994  . Stapedectomy 2007    Dr. Ezzard Standing, left ear  . Laparoscopic ovarian cystectomy 2001  . Thyroid nodule removed 1997  . Total abdominal hysterectomy w/ bilateral salpingoophorectomy     left    Family History  Problem Relation Age of Onset  . Breast cancer Mother   . Ovarian cancer Mother   .  Stomach cancer Maternal Grandmother   . Pancreatic cancer Father   . Prostate cancer Father   . Colon cancer Father   . Colon cancer Maternal Grandmother   . Diabetes Father   . Diabetes Maternal Grandmother   . Heart disease Father   . Heart disease      both grandmothers  . Irritable bowel syndrome Father   . Kidney disease Father     History  Substance Use Topics  . Smoking status: Former Smoker    Types: Cigarettes  . Smokeless tobacco: Not on file   Comment: quit 22yrs ago  . Alcohol Use: No    OB History    Grav Para Term Preterm Abortions TAB SAB Ect Mult Living                  Review of Systems  Constitutional: Positive for chills.  HENT: Negative for neck pain and neck stiffness.   Respiratory: Negative for cough, chest tightness and shortness of breath.   Cardiovascular: Positive for chest pain. Negative for palpitations and leg swelling.  Gastrointestinal: Negative for nausea, vomiting and abdominal pain.  Genitourinary: Negative for dysuria.  Skin: Negative for color change, rash and wound.  Neurological: Negative for dizziness, weakness and numbness.  Hematological: Negative for adenopathy. Does not bruise/bleed easily.  Psychiatric/Behavioral: Negative.     Allergies  Onion; Amoxicillin-pot clavulanate; Cefuroxime; Cephalexin;  Ciprofloxacin; Clarithromycin; Clarithromycin; Doxycycline; Other; Prednisone; Shrimp flavor; Sulfonamide derivatives; and Tramadol hcl  Home Medications   Current Outpatient Rx  Name Route Sig Dispense Refill  . ACETAMINOPHEN 500 MG PO TABS Oral Take 1,000 mg by mouth every 6 (six) hours as needed. For pain    . ALPRAZOLAM 0.25 MG PO TABS Oral Take 1 tablet (0.25 mg total) by mouth at bedtime as needed for anxiety. 30 tablet 1  . VITAMIN C PO Oral Take 1 tablet by mouth daily.    . IBUPROFEN 200 MG PO TABS Oral Take 400-600 mg by mouth every 6 (six) hours as needed. For pain    . LEVOTHYROXINE SODIUM 25 MCG PO TABS Oral Take 1  tablet (25 mcg total) by mouth daily. 31 tablet 11  . LO LOESTRIN FE PO Oral Take 1 tablet by mouth daily.     Marland Kitchen FISH OIL PO Oral Take 2 capsules by mouth daily.      BP 116/81  Pulse 91  Temp 97.9 F (36.6 C)  Resp 17  SpO2 98%  LMP 11/28/2003  Physical Exam  Nursing note and vitals reviewed. Constitutional: She is oriented to person, place, and time. She appears well-developed and well-nourished. No distress.  HENT:  Head: Normocephalic.  Eyes: Conjunctivae normal are normal.  Neck: Neck supple.  Cardiovascular: Normal rate, regular rhythm and normal heart sounds.   Pulmonary/Chest: Effort normal and breath sounds normal. No respiratory distress. She has no wheezes. She has no rales.  Abdominal: Soft. Bowel sounds are normal. She exhibits no distension. There is no tenderness. There is no rebound.  Genitourinary:       Left breast appears normal with no rash, redness, bruising. Tender diffusely. i do not feel any masses, or focal areas of tenderness. Nipple normal with no discharge. No axillary lymphadenopathy  Musculoskeletal: Normal range of motion. She exhibits no edema.  Neurological: She is alert and oriented to person, place, and time.  Skin: Skin is warm and dry. No rash noted.  Psychiatric: She has a normal mood and affect.    ED Course  Procedures (including critical care time)  Pt with left breast pain, diffuse tenderness on exam, i do not feel any masses, no signs of cellulitis, abscess. Will get labs, CXR.   Results for orders placed during the hospital encounter of 06/06/12  CBC      Component Value Range   WBC 9.3  4.0 - 10.5 K/uL   RBC 4.32  3.87 - 5.11 MIL/uL   Hemoglobin 13.8  12.0 - 15.0 g/dL   HCT 19.1  47.8 - 29.5 %   MCV 91.7  78.0 - 100.0 fL   MCH 31.9  26.0 - 34.0 pg   MCHC 34.8  30.0 - 36.0 g/dL   RDW 62.1  30.8 - 65.7 %   Platelets 319  150 - 400 K/uL  BASIC METABOLIC PANEL      Component Value Range   Sodium 139  135 - 145 mEq/L    Potassium 4.0  3.5 - 5.1 mEq/L   Chloride 102  96 - 112 mEq/L   CO2 24  19 - 32 mEq/L   Glucose, Bld 91  70 - 99 mg/dL   BUN 7  6 - 23 mg/dL   Creatinine, Ser 8.46  0.50 - 1.10 mg/dL   Calcium 9.8  8.4 - 96.2 mg/dL   GFR calc non Af Amer >90  >90 mL/min   GFR calc Af Amer >90  >  90 mL/min  POCT I-STAT TROPONIN I      Component Value Range   Troponin i, poc 0.00  0.00 - 0.08 ng/mL   Comment 3            Dg Chest 2 View  06/06/2012  *RADIOLOGY REPORT*  Clinical Data: Breast pain.  CHEST - 2 VIEW  Comparison: Chest x-ray 06/14/2011.  Findings: Lung volumes are normal.  No consolidative airspace disease.  No pleural effusions.  No pneumothorax.  No pulmonary nodule or mass noted.  Pulmonary vasculature and the cardiomediastinal silhouette are within normal limits.  IMPRESSION: 1. No radiographic evidence of acute cardiopulmonary disease.   Original Report Authenticated By: Florencia Reasons, M.D.      Date: 06/06/2012  Rate: 90  Rhythm: normal sinus rhythm  QRS Axis: normal  Intervals: normal  ST/T Wave abnormalities: normal  Conduction Disutrbances: none  Narrative Interpretation:   Old EKG Reviewed: No significant changes noted  8:46 PM Negative labs, CXR, no elevated WBC, pt non toxic appearing. VS normal, she is afebrile. I suspect pain could be musculoskeletal, vs possible deep infection that I cannot palpate vs mastitis. Pt has an appointment on Monday with breast center. Will start on antibiotic, pain meds, follow up as scheduled. Instructed to return if worsening.   Pt allergic to multiple antibiotics including cephalosporins, sulfa, penicillins, will try clindamycin.    1. Breast pain, left       MDM  PT with left breast pain, swelling. Exam unremarkable. No abscess, masses, or signs of infection. Pt with appointment at breast center in 2 days. Will start on antibiotics for possible infection given amount of pain she is having and follow up as scheduled. Unable to get [place]  here in ED. Instructed to return if worsening.         Lottie Mussel, PA 06/06/12 2342

## 2012-06-06 NOTE — ED Notes (Signed)
Pt  D/c home in nad. Pt voiced understanding of d/c instructions and follow up care. Pt instructed not to drive after taking percocet.

## 2012-06-06 NOTE — ED Notes (Signed)
Pt c/o pain in and behind left breast x 2 days; pt sts pain with palpation and moves into under left arm pit; pt deaf but does read lips well; interpretor called for pt

## 2012-06-06 NOTE — ED Notes (Signed)
Returned from xray

## 2012-06-07 NOTE — ED Provider Notes (Signed)
Medical screening examination/treatment/procedure(s) were performed by non-physician practitioner and as supervising physician I was immediately available for consultation/collaboration.  Flint Melter, MD 06/07/12 [phone]

## 2012-07-01 ENCOUNTER — Telehealth: Payer: Self-pay | Admitting: Medical

## 2012-07-01 NOTE — Telephone Encounter (Signed)
Patient called stating that she had requested refill on loloestrin-fe and was denied through the pharmacy. Needs refill.

## 2012-07-02 NOTE — Telephone Encounter (Signed)
Left message with video interpreter # [phone] and stated that we are returning her call and that in order to receive a refill on her medication she will need to schedule an annual appt and to please call the clinics to schedule the appt.

## 2012-07-02 NOTE — Telephone Encounter (Signed)
Returned pt's call and was told by the interpreter that "the video phone is not answering". Pt will need to be contacted later. **Note: pt had last clinic visit in March 2012. I called her [facility] and was told that she has not received Lo Loestrin from them since Jan 2013. We will not be able to re-start pt on her pills without a clinic visit- she needs annual Gyn exam scheduled.

## 2012-07-08 ENCOUNTER — Other Ambulatory Visit: Payer: Self-pay | Admitting: Orthopedic Surgery

## 2012-07-08 ENCOUNTER — Other Ambulatory Visit: Payer: Self-pay | Admitting: *Deleted

## 2012-07-08 DIAGNOSIS — R52 Pain, unspecified: Secondary | ICD-10-CM

## 2012-07-08 DIAGNOSIS — S46019A Strain of muscle(s) and tendon(s) of the rotator cuff of unspecified shoulder, initial encounter: Secondary | ICD-10-CM

## 2012-07-08 MED ORDER — NORETHIN-ETH ESTRAD-FE BIPHAS 1 MG-10 MCG / 10 MCG PO TABS: 1.0000 | ORAL_TABLET | Freq: Every day | ORAL | Status: DC

## 2012-07-16 ENCOUNTER — Ambulatory Visit
Admission: RE | Admit: 2012-07-16 | Discharge: 2012-07-16 | Disposition: A | Discharge status: Home / Self Care | Payer: BC Managed Care – PPO | Source: Ambulatory Visit | Attending: Orthopedic Surgery | Admitting: Orthopedic Surgery

## 2012-07-16 DIAGNOSIS — R52 Pain, unspecified: Secondary | ICD-10-CM

## 2012-07-16 DIAGNOSIS — S46019A Strain of muscle(s) and tendon(s) of the rotator cuff of unspecified shoulder, initial encounter: Secondary | ICD-10-CM

## 2012-07-16 MED ORDER — IOHEXOL 180 MG/ML  SOLN
15.0000 mL | Freq: Once | INTRAMUSCULAR | Status: AC | PRN
  Administered 2012-07-16: 15 mL via INTRA_ARTICULAR

## 2012-07-17 ENCOUNTER — Other Ambulatory Visit: Payer: BC Managed Care – PPO

## 2012-08-08 ENCOUNTER — Emergency Department (HOSPITAL_COMMUNITY)
Admission: EM | Admit: 2012-08-08 | Discharge: 2012-08-08 | Disposition: A | Discharge status: Home / Self Care | Payer: BC Managed Care – PPO | Attending: Emergency Medicine | Admitting: Emergency Medicine

## 2012-08-08 ENCOUNTER — Emergency Department (HOSPITAL_COMMUNITY): Payer: BC Managed Care – PPO

## 2012-08-08 ENCOUNTER — Encounter (HOSPITAL_COMMUNITY): Payer: Self-pay | Admitting: Emergency Medicine

## 2012-08-08 DIAGNOSIS — Z87891 Personal history of nicotine dependence: Secondary | ICD-10-CM | POA: Insufficient documentation

## 2012-08-08 DIAGNOSIS — S93509A Unspecified sprain of unspecified toe(s), initial encounter: Secondary | ICD-10-CM

## 2012-08-08 DIAGNOSIS — Y9389 Activity, other specified: Secondary | ICD-10-CM | POA: Insufficient documentation

## 2012-08-08 DIAGNOSIS — Z8719 Personal history of other diseases of the digestive system: Secondary | ICD-10-CM | POA: Insufficient documentation

## 2012-08-08 DIAGNOSIS — S93609A Unspecified sprain of unspecified foot, initial encounter: Secondary | ICD-10-CM | POA: Insufficient documentation

## 2012-08-08 DIAGNOSIS — Y929 Unspecified place or not applicable: Secondary | ICD-10-CM | POA: Insufficient documentation

## 2012-08-08 DIAGNOSIS — Z79899 Other long term (current) drug therapy: Secondary | ICD-10-CM | POA: Insufficient documentation

## 2012-08-08 DIAGNOSIS — E039 Hypothyroidism, unspecified: Secondary | ICD-10-CM | POA: Insufficient documentation

## 2012-08-08 DIAGNOSIS — X58XXXA Exposure to other specified factors, initial encounter: Secondary | ICD-10-CM | POA: Insufficient documentation

## 2012-08-08 DIAGNOSIS — F411 Generalized anxiety disorder: Secondary | ICD-10-CM | POA: Insufficient documentation

## 2012-08-08 MED ORDER — HYDROCODONE-ACETAMINOPHEN 5-325 MG PO TABS: 1.0000 | ORAL_TABLET | Freq: Four times a day (QID) | ORAL | Status: DC | PRN

## 2012-08-08 NOTE — ED Provider Notes (Signed)
History   This chart was scribed for Celene Kras, MD by Charolett Bumpers, ER Scribe. The patient was seen in room TR07C/TR07C. Patient's care was started at 1257.   CSN: [id number] Arrival date & time 08/08/12  1249  First MD Initiated Contact with Patient 08/08/12 1257      The history is provided by the patient. No language interpreter was used.   Donna Davis is a 41 y.o. female who presents to the Emergency Department complaining of constant, moderate right 2nd toe pain since last night. She states that injured her toe when she tripped over her dog last night. She states that she has associated mild swelling and bruising that has worsened today. She denies any other injuries or complaints of pain.   Past Medical History  Diagnosis Date  . GERD (gastroesophageal reflux disease)   . Bipolar affective     pt denies  . Hypothyroidism   . IBS (irritable bowel syndrome)   . Anxiety   . Hearing difficulty     b/l, 2/2 congenital rubella s/p stapedectomy. Using hearing aid  . Headache 04/2005    h/o headache in past that raised question of psuedotumor s/p therapeutic LP with an opening preassure of 18 cm H2O   . Hepatic cyst     6 mm on CT done done in 05/2005  . Polysubstance abuse     including codiene, hydrocodone, tobacco and adderal  . Family history of malignant neoplasm of gastrointestinal tract     Past Surgical History  Procedure Date  . Abdominal hysterectomy     partial hysterecotmy 2003 for endoemtriosis with residula right ovaey. Multiple GYN surgeries for chronic pelvic pain  . Cesarean section 1994  . Stapedectomy 2007    Dr. Ezzard Standing, left ear  . Laparoscopic ovarian cystectomy 2001  . Thyroid nodule removed 1997  . Total abdominal hysterectomy w/ bilateral salpingoophorectomy     left    Family History  Problem Relation Age of Onset  . Breast cancer Mother   . Ovarian cancer Mother   . Stomach cancer Maternal Grandmother   . Pancreatic cancer Father     . Prostate cancer Father   . Colon cancer Father   . Colon cancer Maternal Grandmother   . Diabetes Father   . Diabetes Maternal Grandmother   . Heart disease Father   . Heart disease      both grandmothers  . Irritable bowel syndrome Father   . Kidney disease Father     History  Substance Use Topics  . Smoking status: Former Smoker    Types: Cigarettes  . Smokeless tobacco: Not on file     Comment: quit 40yrs ago  . Alcohol Use: No    OB History    Grav Para Term Preterm Abortions TAB SAB Ect Mult Living                  Review of Systems  Constitutional: Negative for fever and chills.  Respiratory: Negative for shortness of breath.   Gastrointestinal: Negative for nausea and vomiting.  Musculoskeletal: Positive for arthralgias.       Right 2nd toe pain.   Neurological: Negative for weakness.  All other systems reviewed and are negative.    Allergies  Onion; Amoxicillin-pot clavulanate; Cefuroxime; Cephalexin; Ciprofloxacin; Clarithromycin; Clarithromycin; Doxycycline; Other; Prednisone; Shrimp flavor; Sulfonamide derivatives; and Tramadol hcl  Home Medications   Current Outpatient Rx  Name  Route  Sig  Dispense  Refill  .  ACETAMINOPHEN 500 MG PO TABS   Oral   Take 1,000 mg by mouth every 6 (six) hours as needed. For pain         . ALPRAZOLAM 0.25 MG PO TABS   Oral   Take 1 tablet (0.25 mg total) by mouth at bedtime as needed for anxiety.   30 tablet   1   . VITAMIN C PO   Oral   Take 1 tablet by mouth daily.         Marland Kitchen CLINDAMYCIN HCL 300 MG PO CAPS   Oral   Take 1 capsule (300 mg total) by mouth 3 (three) times daily.   30 capsule   0   . IBUPROFEN 200 MG PO TABS   Oral   Take 400-600 mg by mouth every 6 (six) hours as needed. For pain         . LEVOTHYROXINE SODIUM 25 MCG PO TABS   Oral   Take 1 tablet (25 mcg total) by mouth daily.   31 tablet   11   . NORETHIN-ETH ESTRAD-FE BIPHAS 1 MG-10 MCG / 10 MCG PO TABS   Oral   Take 1  tablet by mouth daily.   3 Package   0     Rx faxed to [facility] per pt requ ...   . FISH OIL PO   Oral   Take 2 capsules by mouth daily.         . OXYCODONE-ACETAMINOPHEN 5-325 MG PO TABS   Oral   Take 1 tablet by mouth every 4 (four) hours as needed for pain.   20 tablet   0     BP 132/72  Pulse 90  Temp 99.2 F (37.3 C)  Resp 16  SpO2 97%  LMP 11/28/2003  Physical Exam  Nursing note and vitals reviewed. Constitutional: She appears well-developed and well-nourished. No distress.  HENT:  Head: Normocephalic and atraumatic.  Right Ear: External ear normal.  Left Ear: External ear normal.  Eyes: Conjunctivae normal are normal. Right eye exhibits no discharge. Left eye exhibits no discharge. No scleral icterus.  Neck: Neck supple. No tracheal deviation present.  Cardiovascular: Normal rate.   Pulmonary/Chest: Effort normal. No stridor. No respiratory distress.  Musculoskeletal: She exhibits tenderness. She exhibits no edema.       Tenderness to palpation over distal phalanx of 2nd toe on right foot. Pain with ROM. No gross deformities. No tenderness to palpation over proximal foot, proximal tibia or medial/lateral malleolus.   Neurological: She is alert. Cranial nerve deficit: no gross deficits.  Skin: Skin is warm and dry. No rash noted.  Psychiatric: She has a normal mood and affect.    ED Course  Procedures (including critical care time)  DIAGNOSTIC STUDIES: Oxygen Saturation is 97% on room air, adequate by my interpretation.    COORDINATION OF CARE:  13:05-Discussed planned course of treatment with the patient including an x-ray of right foot, who is agreeable at this time.   Labs Reviewed - No data to display Dg Foot Complete Right  08/08/2012  *RADIOLOGY REPORT*  Clinical Data: Injury to right foot with pain, numbness and swelling.  RIGHT FOOT COMPLETE - 3+ VIEW  Comparison: None.  Findings: No acute fracture or dislocation identified.   No soft tissue abnormalities are seen.  No significant arthropathy.  IMPRESSION: No acute findings.   Original Report Authenticated By: Irish Lack, M.D.      1. Sprain of toe  MDM  No definite fracture.  Will treat for sprain.  Buddy tape.  Hard sole shoe     I personally performed the services described in this documentation, which was scribed in my presence. The recorded information has been reviewed and is accurate.    Celene Kras, MD 08/08/12 1346

## 2012-08-08 NOTE — ED Notes (Signed)
Got tangled up in dog leash last night hurt  Rt foot ? Broken toe  Felt a pop

## 2012-08-08 NOTE — ED Notes (Signed)
Hyperflexed right second toe yesterday. Today discolored and painful.

## 2012-08-08 NOTE — Progress Notes (Signed)
Orthopedic Tech Progress Note Patient Details:  Seniah P Sek November 25, 1970 [id number]  Ortho Devices Type of Ortho Device: Postop boot;Buddy tape Ortho Device/Splint Location: RIGHT BUDDY TAPE AND POST OP SHOE Ortho Device/Splint Interventions: Application   Cammer, Mickie Bail 08/08/2012, 2:10 PM

## 2012-08-16 ENCOUNTER — Emergency Department (HOSPITAL_COMMUNITY): Payer: BC Managed Care – PPO

## 2012-08-16 ENCOUNTER — Encounter (HOSPITAL_COMMUNITY): Payer: Self-pay | Admitting: Emergency Medicine

## 2012-08-16 ENCOUNTER — Emergency Department (HOSPITAL_COMMUNITY)
Admission: EM | Admit: 2012-08-16 | Discharge: 2012-08-16 | Disposition: A | Discharge status: Home / Self Care | Payer: BC Managed Care – PPO | Attending: Emergency Medicine | Admitting: Emergency Medicine

## 2012-08-16 DIAGNOSIS — J3489 Other specified disorders of nose and nasal sinuses: Secondary | ICD-10-CM | POA: Insufficient documentation

## 2012-08-16 DIAGNOSIS — H9190 Unspecified hearing loss, unspecified ear: Secondary | ICD-10-CM | POA: Insufficient documentation

## 2012-08-16 DIAGNOSIS — Z79899 Other long term (current) drug therapy: Secondary | ICD-10-CM | POA: Insufficient documentation

## 2012-08-16 DIAGNOSIS — R197 Diarrhea, unspecified: Secondary | ICD-10-CM | POA: Insufficient documentation

## 2012-08-16 DIAGNOSIS — M2550 Pain in unspecified joint: Secondary | ICD-10-CM | POA: Insufficient documentation

## 2012-08-16 DIAGNOSIS — F319 Bipolar disorder, unspecified: Secondary | ICD-10-CM | POA: Insufficient documentation

## 2012-08-16 DIAGNOSIS — E039 Hypothyroidism, unspecified: Secondary | ICD-10-CM | POA: Insufficient documentation

## 2012-08-16 DIAGNOSIS — R51 Headache: Secondary | ICD-10-CM | POA: Insufficient documentation

## 2012-08-16 DIAGNOSIS — R05 Cough: Secondary | ICD-10-CM

## 2012-08-16 DIAGNOSIS — R112 Nausea with vomiting, unspecified: Secondary | ICD-10-CM | POA: Insufficient documentation

## 2012-08-16 DIAGNOSIS — Z8719 Personal history of other diseases of the digestive system: Secondary | ICD-10-CM | POA: Insufficient documentation

## 2012-08-16 DIAGNOSIS — R6883 Chills (without fever): Secondary | ICD-10-CM | POA: Insufficient documentation

## 2012-08-16 DIAGNOSIS — Z8659 Personal history of other mental and behavioral disorders: Secondary | ICD-10-CM | POA: Insufficient documentation

## 2012-08-16 DIAGNOSIS — R5381 Other malaise: Secondary | ICD-10-CM | POA: Insufficient documentation

## 2012-08-16 DIAGNOSIS — J029 Acute pharyngitis, unspecified: Secondary | ICD-10-CM | POA: Insufficient documentation

## 2012-08-16 DIAGNOSIS — F1910 Other psychoactive substance abuse, uncomplicated: Secondary | ICD-10-CM | POA: Insufficient documentation

## 2012-08-16 DIAGNOSIS — Z87891 Personal history of nicotine dependence: Secondary | ICD-10-CM | POA: Insufficient documentation

## 2012-08-16 DIAGNOSIS — J069 Acute upper respiratory infection, unspecified: Secondary | ICD-10-CM | POA: Insufficient documentation

## 2012-08-16 DIAGNOSIS — J40 Bronchitis, not specified as acute or chronic: Secondary | ICD-10-CM

## 2012-08-16 DIAGNOSIS — R5383 Other fatigue: Secondary | ICD-10-CM | POA: Insufficient documentation

## 2012-08-16 LAB — CBC WITH DIFFERENTIAL/PLATELET
Basophils Absolute: 0 10*3/uL (ref 0.0–0.1)
Basophils Relative: 0 % (ref 0–1)
HCT: 40.7 % (ref 36.0–46.0)
MCHC: 33.4 g/dL (ref 30.0–36.0)
Monocytes Absolute: 0.4 10*3/uL (ref 0.1–1.0)
Neutro Abs: 4.6 10*3/uL (ref 1.7–7.7)
Neutrophils Relative %: 55 % (ref 43–77)
RDW: 12.1 % (ref 11.5–15.5)

## 2012-08-16 LAB — BASIC METABOLIC PANEL
Calcium: 9.6 mg/dL (ref 8.4–10.5)
Chloride: 104 mEq/L (ref 96–112)
Creatinine, Ser: 0.64 mg/dL (ref 0.50–1.10)
GFR calc Af Amer: >90 mL/min (ref >90)

## 2012-08-16 MED ORDER — ALBUTEROL SULFATE (5 MG/ML) 0.5% IN NEBU
5.0000 mg | INHALATION_SOLUTION | Freq: Once | RESPIRATORY_TRACT | Status: AC
  Administered 2012-08-16: 5 mg via RESPIRATORY_TRACT
  Filled 2012-08-16: qty 1

## 2012-08-16 MED ORDER — GUAIFENESIN-CODEINE 100-10 MG/5ML PO SOLN: 5.0000 mL | Freq: Three times a day (TID) | ORAL | Status: DC | PRN

## 2012-08-16 MED ORDER — BENADRYL 25 MG PO TABS: 25.0000 mg | ORAL_TABLET | Freq: Four times a day (QID) | ORAL | Status: DC | PRN

## 2012-08-16 MED ORDER — GUAIFENESIN-CODEINE 100-10 MG/5ML PO SOLN: 10.0000 mL | Freq: Once | ORAL | Status: DC

## 2012-08-16 MED ORDER — ALBUTEROL SULFATE HFA 108 (90 BASE) MCG/ACT IN AERS
2.0000 | INHALATION_SPRAY | RESPIRATORY_TRACT | Status: DC | PRN
  Administered 2012-08-16: 2 via RESPIRATORY_TRACT
  Filled 2012-08-16: qty 6.7

## 2012-08-16 MED ORDER — IBUPROFEN 800 MG PO TABS
800.0000 mg | ORAL_TABLET | Freq: Once | ORAL | Status: AC
  Administered 2012-08-16: 800 mg via ORAL
  Filled 2012-08-16: qty 1

## 2012-08-16 MED ORDER — ONDANSETRON HCL 4 MG PO TABS: 4.0000 mg | ORAL_TABLET | Freq: Four times a day (QID) | ORAL | Status: DC

## 2012-08-16 MED ORDER — SODIUM CHLORIDE 0.9 % IV SOLN: Freq: Once | INTRAVENOUS | Status: DC

## 2012-08-16 MED ORDER — DIPHENHYDRAMINE HCL 25 MG PO CAPS
25.0000 mg | ORAL_CAPSULE | Freq: Once | ORAL | Status: AC
  Administered 2012-08-16: 25 mg via ORAL
  Filled 2012-08-16: qty 1

## 2012-08-16 MED ORDER — SODIUM CHLORIDE 0.9 % IV BOLUS (SEPSIS)
1000.0000 mL | Freq: Once | INTRAVENOUS | Status: AC
  Administered 2012-08-16: 1000 mL via INTRAVENOUS

## 2012-08-16 MED ORDER — ONDANSETRON HCL 4 MG/2ML IJ SOLN
4.0000 mg | Freq: Once | INTRAMUSCULAR | Status: AC
  Administered 2012-08-16: 4 mg via INTRAVENOUS
  Filled 2012-08-16: qty 2

## 2012-08-16 MED ORDER — BENZONATATE 100 MG PO CAPS: 100.0000 mg | ORAL_CAPSULE | Freq: Three times a day (TID) | ORAL | Status: DC

## 2012-08-16 NOTE — ED Notes (Signed)
Hearing  impaired staff on call paged for PT. Contact # [phone]

## 2012-08-16 NOTE — ED Notes (Signed)
Pt returned from CT °

## 2012-08-16 NOTE — ED Provider Notes (Signed)
History     CSN: [id number]  Arrival date & time 08/16/12  1140   None     Chief Complaint  Patient presents with  . URI  . Headache  . Emesis  . Diarrhea    (Consider location/radiation/quality/duration/timing/severity/associated sxs/prior treatment) Patient is a 41 y.o. female presenting with URI, headaches, vomiting, and diarrhea. The history is provided by the patient and the spouse. The history is limited by a language barrier. A language interpreter was used (sign language).  URI The primary symptoms include fatigue, headaches, sore throat, cough, vomiting and arthralgias. Primary symptoms do not include fever, swollen glands, wheezing, abdominal pain or nausea. Primary symptoms comment: post tussive emesis The current episode started today. This is a new problem. The problem has been gradually worsening.  The headache is not associated with neck stiffness.  Symptoms associated with the illness include chills and rhinorrhea. The illness is not associated with plugged ear sensation, facial pain or sinus pressure.  Headache  Associated symptoms include vomiting. Pertinent negatives include no fever, no shortness of breath and no nausea.  Emesis  Associated symptoms include arthralgias, chills, cough, diarrhea, headaches and URI. Pertinent negatives include no abdominal pain and no fever.  Diarrhea The primary symptoms include fatigue, vomiting, diarrhea and arthralgias. Primary symptoms do not include fever, abdominal pain or nausea. Primary symptoms comment: post tussive emesis The illness began today. The onset was gradual. The problem has been resolved.  The illness is also significant for chills and bloating. The illness does not include back pain.  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  24 hours of runny nose and sore throat from coughing.  Has taken nothing over the counter.  States that the cough is worse when she  lays down.  She has also had 4 episodes of diarrhea today.  No antibiotics in the last 3 months.  pmh GERD, bipolar, hypothyroidism, ibs, anxiety, hearing impaired, polysubstance abuse.   Past Medical History  Diagnosis Date  . GERD (gastroesophageal reflux disease)   . Bipolar affective     pt denies  . Hypothyroidism   . IBS (irritable bowel syndrome)   . Anxiety   . Hearing difficulty     b/l, 2/2 congenital rubella s/p stapedectomy. Using hearing aid  . Headache 04/2005    h/o headache in past that raised question of psuedotumor s/p therapeutic LP with an opening preassure of 18 cm H2O   . Hepatic cyst     6 mm on CT done done in 05/2005  . Polysubstance abuse     including codiene, hydrocodone, tobacco and adderal  . Family history of malignant neoplasm of gastrointestinal tract     Past Surgical History  Procedure Date  . Abdominal hysterectomy     partial hysterecotmy 2003 for endoemtriosis with residula right ovaey. Multiple GYN surgeries for chronic pelvic pain  . Cesarean section 1994  . Stapedectomy 2007    Dr. Ezzard Standing, left ear  . Laparoscopic ovarian cystectomy 2001  . Thyroid nodule removed 1997  . Total abdominal hysterectomy w/ bilateral salpingoophorectomy     left    Family History  Problem Relation Age of Onset  . Breast cancer Mother   . Ovarian cancer Mother   . Stomach cancer Maternal Grandmother   . Pancreatic cancer Father   . Prostate cancer Father   . Colon cancer Father   . Colon cancer Maternal Grandmother   . Diabetes  Father   . Diabetes Maternal Grandmother   . Heart disease Father   . Heart disease      both grandmothers  . Irritable bowel syndrome Father   . Kidney disease Father     History  Substance Use Topics  . Smoking status: Former Smoker    Types: Cigarettes  . Smokeless tobacco: Not on file     Comment: quit 1yrs ago  . Alcohol Use: No    OB History    Grav Para Term Preterm Abortions TAB SAB Ect Mult Living                   Review of Systems  Constitutional: Positive for chills and fatigue. Negative for fever.  HENT: Positive for sore throat and rhinorrhea. Negative for neck pain, neck stiffness and sinus pressure.   Eyes: Negative.   Respiratory: Positive for cough. Negative for chest tightness, shortness of breath and wheezing.   Cardiovascular: Negative.  Negative for chest pain.  Gastrointestinal: Positive for vomiting, diarrhea and bloating. Negative for nausea and abdominal pain.  Musculoskeletal: Positive for arthralgias. Negative for back pain and gait problem.  Neurological: Positive for headaches.  Psychiatric/Behavioral: Negative.   All other systems reviewed and are negative.    Allergies  Onion; Amoxicillin-pot clavulanate; Cefuroxime; Cephalexin; Ciprofloxacin; Clarithromycin; Clarithromycin; Doxycycline; Other; Prednisone; Shrimp flavor; Sulfonamide derivatives; and Tramadol hcl  Home Medications   Current Outpatient Rx  Name  Route  Sig  Dispense  Refill  . ACETAMINOPHEN 500 MG PO TABS   Oral   Take 1,000 mg by mouth every 6 (six) hours as needed. For pain         . VITAMIN C PO   Oral   Take 1 tablet by mouth daily.         Marland Kitchen VITAMIN C 100 MG PO TABS   Oral   Take 100 mg by mouth daily.         Marland Kitchen VITAMIN D 1000 UNITS PO TABS   Oral   Take 1,000 Units by mouth daily.         Marland Kitchen FOLIC ACID PO   Oral   Take 1 tablet by mouth daily.         Marland Kitchen LEVOTHYROXINE SODIUM 25 MCG PO TABS   Oral   Take 1 tablet (25 mcg total) by mouth daily.   31 tablet   11   . NORETHIN-ETH ESTRAD-FE BIPHAS 1 MG-10 MCG / 10 MCG PO TABS   Oral   Take 1 tablet by mouth daily.   3 Package   0     Rx faxed to [facility] per pt requ ...   . FISH OIL PO   Oral   Take 2 capsules by mouth daily.         Marland Kitchen VITAMIN B-12 100 MCG PO TABS   Oral   Take 50 mcg by mouth daily.           BP 120/68  Pulse 87  Temp 100.1 F (37.8 C) (Oral)  Resp 18  SpO2  100%  LMP 11/28/2003  Physical Exam  Nursing note and vitals reviewed. Constitutional: She is oriented to person, place, and time. She appears well-developed and well-nourished.  HENT:  Head: Normocephalic and atraumatic.  Right Ear: Tympanic membrane normal.  Left Ear: There is tenderness. Tympanic membrane is not injected, not erythematous and not bulging.  Nose: Mucosal edema and rhinorrhea present.  Mouth/Throat: Uvula is midline  and mucous membranes are normal. No oropharyngeal exudate, posterior oropharyngeal edema, posterior oropharyngeal erythema or tonsillar abscesses.  Eyes: Conjunctivae normal and EOM are normal. Pupils are equal, round, and reactive to light.  Neck: Normal range of motion. Neck supple.  Cardiovascular: Normal rate.   Pulmonary/Chest: Effort normal.  Abdominal: Soft.  Musculoskeletal: Normal range of motion. She exhibits no edema and no tenderness.  Neurological: She is alert and oriented to person, place, and time. She has normal reflexes.  Skin: Skin is warm and dry.  Psychiatric: She has a normal mood and affect.    ED Course  Procedures (including critical care time)  Labs Reviewed  BASIC METABOLIC PANEL - Abnormal; Notable for the following:    Glucose, Bld 127 (*)     All other components within normal limits  CBC WITH DIFFERENTIAL  URINALYSIS, ROUTINE W REFLEX MICROSCOPIC   Dg Chest 2 View  08/16/2012  *RADIOLOGY REPORT*  Clinical Data: Shortness of breath, cough, headache, and abdominal pain.  CHEST - 2 VIEW  Comparison: 06/06/2012  Findings: The heart, mediastinal, and hilar contours are normal. Pulmonary vascularity is normal and the trachea is midline.  The lungs are well expanded and clear.  There is no pleural effusion or pneumothorax.  The bony thorax is intact.  IMPRESSION: No acute cardiopulmonary disease.   Original Report Authenticated By: Britta Mccreedy, M.D.      No diagnosis found.    MDM  Violent cough, diarrhea and headache  treated in ER with IVF, benadryl and ibuprofen.  rx for tessalon pearls, robitussin ac and zofran.  Follow up with pcp as needed. Understands to return for worsening symproms.        Remi Haggard, NP 08/17/12 [id number]

## 2012-08-16 NOTE — ED Notes (Signed)
Pt c/o cough with N/V/D and generalized body aches x 3 days; pt sts HA and fever as well; pt hearing impaired but reads lips well and interpretor called for

## 2012-08-16 NOTE — ED Notes (Signed)
Iv team at bedside  

## 2012-08-17 NOTE — ED Provider Notes (Signed)
Medical screening examination/treatment/procedure(s) were performed by non-physician practitioner and as supervising physician I was immediately available for consultation/collaboration.  Martha K Linker, MD 08/17/12 1107 

## 2012-08-28 ENCOUNTER — Other Ambulatory Visit: Payer: Self-pay | Admitting: Family Medicine

## 2012-08-28 ENCOUNTER — Encounter: Payer: Self-pay | Admitting: Family Medicine

## 2012-08-28 ENCOUNTER — Ambulatory Visit (INDEPENDENT_AMBULATORY_CARE_PROVIDER_SITE_OTHER): Payer: BC Managed Care – PPO | Admitting: Family Medicine

## 2012-08-28 VITALS — BP 127/92 | HR 89 | Temp 99.2°F | Ht 64.0 in | Wt 186.4 lb

## 2012-08-28 DIAGNOSIS — R6882 Decreased libido: Secondary | ICD-10-CM

## 2012-08-28 DIAGNOSIS — E669 Obesity, unspecified: Secondary | ICD-10-CM

## 2012-08-28 DIAGNOSIS — E66811 Obesity, class 1: Secondary | ICD-10-CM

## 2012-08-28 DIAGNOSIS — Z780 Asymptomatic menopausal state: Secondary | ICD-10-CM

## 2012-08-28 DIAGNOSIS — Z79890 Hormone replacement therapy (postmenopausal): Secondary | ICD-10-CM

## 2012-08-28 DIAGNOSIS — Z5181 Encounter for therapeutic drug level monitoring: Secondary | ICD-10-CM

## 2012-08-28 DIAGNOSIS — N951 Menopausal and female climacteric states: Secondary | ICD-10-CM

## 2012-08-28 MED ORDER — NORETHIN-ETH ESTRAD-FE BIPHAS 1 MG-10 MCG / 10 MCG PO TABS: 1.0000 | ORAL_TABLET | Freq: Every day | ORAL | Status: DC

## 2012-08-28 NOTE — Progress Notes (Signed)
Subjective:    Patient ID: Donna Davis, female    DOB: 1970-12-12, 41 y.o.   MRN: [id number]  HPI 41 y.o. female with surgical menopause since 2005, severe hot flashes and mood swings. Dr. Okey Dupre put her on OCPs (Lo Loestrin) and she needs refills. Tried estradiol previously and didn't work as well. She has tried stopping the Loestrin for one week but her hot flashes became severe again. Is in school (getting bachelor's at [employer] and has difficulty focusing and concentrating off OCPs. On the pills she has only mild nigh-time symptoms of occasional sweats. She does not want to take SSRI but is willing to try but worried about weight gain and sexual function. Pt also complains of decreased libido - no interest in sex for past 5-6 years since hysterectomy. Also has gained a lot of weight over past 5 years.  Mammogram 3-4 months ago, normal per pt. MM in 2011 normal.   Past Medical History  Diagnosis Date  . GERD (gastroesophageal reflux disease)   . Bipolar affective     pt denies - states her daughter has bipolar disorder  . Hypothyroidism   . IBS (irritable bowel syndrome)   . Anxiety -   . Hearing difficulty     b/l, 2/2 congenital rubella s/p stapedectomy. Using hearing aid  . Headache 04/2005    h/o headache in past that raised question of psuedotumor s/p therapeutic LP with an opening preassure of 18 cm H2O   . Hepatic cyst     6 mm on CT done done in 05/2005  . Polysubstance abuse     including codiene, hydrocodone, tobacco and adderal  . Family history of malignant neoplasm of gastrointestinal tract    Note:  Pt denies bipolar disorder, depression.  PSH Past Surgical History  Procedure Date  . Abdominal hysterectomy     partial hysterecotmy 2003 for endoemtriosis with residula right ovaey. Multiple GYN surgeries for chronic pelvic pain  . Cesarean section 1994  . Stapedectomy 2007    Dr. Ezzard Standing, left ear  . Laparoscopic ovarian cystectomy 2001  . Thyroid nodule removed 1997  .  Total abdominal hysterectomy w/ bilateral salpingoophorectomy     left    FM:  DM father, mother HTN, father HTN, father MI died. Breast cancer - mom, age 59.   Elevated blood sugar fasting in ?? Labs   Review of Systems  Constitutional: Negative for fever, chills, activity change, appetite change and fatigue.  Respiratory: Positive for cough. Negative for chest tightness, shortness of breath and wheezing.   Cardiovascular: Negative for chest pain and palpitations.  Gastrointestinal: Negative for nausea, vomiting, abdominal pain, diarrhea and constipation.  Genitourinary: Negative for dysuria, urgency, frequency, difficulty urinating, vaginal pain, menstrual problem and pelvic pain.  Neurological: Negative for headaches.       Objective:   Physical Exam  Constitutional: She is oriented to person, place, and time. She appears well-developed and well-nourished. No distress.       obese  HENT:  Head: Normocephalic and atraumatic.  Eyes: Conjunctivae normal and EOM are normal.  Neck: Normal range of motion. No tracheal deviation present. No thyromegaly present.  Cardiovascular: Normal rate, regular rhythm, normal heart sounds and intact distal pulses.   Pulmonary/Chest: Effort normal and breath sounds normal. No respiratory distress. She has no wheezes.  Abdominal: Soft. There is no tenderness. There is no rebound and no guarding.       Obese  Genitourinary:       Normal  vulva, vagina. No vaginal discharge. Uterus and cervix absent. No adnexal tenderness.  Neurological: She is alert and oriented to person, place, and time.  Skin: Skin is warm and dry.  Psychiatric: She has a normal mood and affect.   Filed Vitals:   08/28/12 1435  BP: 127/92  Pulse: 89  Temp: 99.2 F (37.3 C)      Assessment & Plan:  42 y.o. female with surgical menopause, severe menopausal symptoms -Refill Loestrin. Discussed risks of long-term use but patient desires to continue. Effexor is not a great  option given patient's sexual dysfunction.  - Watch blood pressure. Pt encouraged to f/u with her PCP to monitor. - Elevated fasting BS in November - encouraged to talk to PCP about diabetes or pre-diabetes - Decreased libido - pt adamantly denies depression and does not want to try antidepressants; encouraged her to seek counseling with husband, exercise, weight loss - No pap smear done since no cervix and pathology from hysterectomy showed no cancer. Vaginal cuff looks healthy. - F/U one year.

## 2012-08-28 NOTE — Patient Instructions (Signed)
Hormonal Therapy for Women, Frequently Asked Questions WHAT IS HORMONE THERAPY? Hormone therapy (HT), estrogen and progesterone, provides women with the female hormones that decrease and are lost as women get older. When the hormone estrogen is given alone, it is usually referred to as "ERT." When the hormone progesterone is combined with estrogen, it is generally called "HT." Previously this was known as hormone replacement therapy (HRT). Estrogen is a female hormone that brings about changes in various organs in the body. Progesterone is a female hormone that prepares the uterus for a pregnancy each month. During the change-over to menopause ("perimenopause") these hormone levels start to decrease. This causes many uncomfortable symptoms (see below). When the ovaries stop producing estrogen and progesterone, menstrual periods come to an end. At this point, the woman has experienced menopause. Menopause is complete when a woman misses 12 consecutive menstrual periods. WHAT ARE THE BENEFITS OF HORMONE THERAPY? Hormone therapy has been used to relieve the short-term symptoms of menopause. These include:  Hot flashes.  Depression.  Memory loss.  Correcting irregular menstrual periods.  Night sweats.  Tiredness.  Mood disturbances.  Thinning of scalp hair.  Disturbed sleep.  Vaginal dryness.  Painful intercourse.  Loss of breast tissue. Evidence shows that HT may be helpful in preventing colon cancer and bone loss (osteoporosis). WHAT ARE THE SHORT-TERM RISKS OF HORMONE THERAPY?  Some women report side effects from taking Hormone Therapy, including:  Feeling sick to stomach (nausea).  Fluid retention.  Swollen breasts.  Acne, when taking HT with progesterone.  Unusual vaginal discharge and bleeding (if the uterus is present).  Headaches.  Some women think HT will make them gain weight. Research now shows this is not true. Some women do gain weight during menopause, but this  is because their metabolism slows down as they age. They also may not be increasing their amount or level of physical activity as they get older.  Short-term benefits or side effects should become noticeable within days, weeks, or sometimes months after treatment begins. LONG-TERM RISKS These will not be easily noticeable for each individual woman. There are many factors involved that can contribute to long-term risks and side effects. CANCER There is concern that HT can increase the risk of some cancers, including endometrial cancer (lining of the uterus), breast, and certain (but not all) ovarian or cervix cancers, such as endometriod ovarian cancer.  When estrogen is taken alone, it raises the risk of endometrial cancer, if the uterus is still present. Adding progestin with estrogen (HT) can greatly reduce this risk. Progestin is added to prevent the overgrowth (hyperplasia) of cells in the uterine lining. Women who still have an intact uterus are generally given this combined therapy and should not take estrogen hormone alone without progesterone. HT with estrogen and progestin has been linked to an increased risk of invasive breast cancer. Women who use estrogen plus progestin for four years or longer are more likely to develop breast cancer than women who have not used them for as long. This indicates that the therapy may have a cumulative effect. The decision to take HT should be based on an overall look at the risk and benefits, and how they fit with your personal and genetic health profile. Conditions that increase the underlying risk of developing breast cancer include:  Family history of breast cancer.  Early age of the first menstrual period (menarche).  Late age of child bearing.  High fat diet.  Late menopause.  Obesity.  Increased breast density   on mammograms.  Certain non-cancerous (benign) breast lesions.  Excessive use of alcohol.  Extensive radiation exposure to the  chest. These factors need to be considered when deciding to take HT. If you are currently taking HT and have concerns, talk with your caregiver as soon as possible.  BREAST DENSITY Taking both estrogen and progestin also can affect a woman's breast density. Increased breast density from HT makes it hard for a radiologist to read some special breast x-rays (mammograms). This leads to the need for follow-up mammograms, ultrasound or MRI (magnetic resonance imaging), or taking breast tissue samples that are surgically removed (biopsies). Increased density also is a concern because studies have shown that women age 45 and older, whose mammograms show at least 75 percent dense tissue, are at increased risk for breast cancer. However, it is not known if increased breast density due to HT carries the same risk for breast cancer as having naturally dense breasts. About 25 percent of women who use combined HT have an increase in breast density on their mammograms. This is compared to about 8 percent of women taking estrogen alone. One study showed that stopping HT for about 2 weeks before having a mammogram improved the readability of the mammogram. But further research is needed to confirm the usefulness of this approach. HEART DISEASE In the past, taking HT (estrogen plus progestin) was thought to help protect women against heart disease. However, recent findings show that taking HT poses more risks than benefits. HT could increase a woman's risk for:  Heart disease.  Stroke.  Blood clot in the lung (pulmonary embolism).  Breast cancer.  Blood clots in the legs. Women who have gone through menopause should not be given HT to prevent heart disease and other chronic conditions.  Women who have gone through menopause and who have heart disease, may have a greater risk of another cardiac event (like heart attack) after starting HT, at least in the short-term. For women who have had strokes, their risk for  having another stroke goes up when they start taking HT. Hormones are not recommended for women with heart disease or for women who have had a stroke. If you have gone through menopause, talk with your caregiver about whether hormones are right for you. You can check the National Women's Health Information Center website (www.womenshealth.gov) for updates on postmenopausal hormone therapy. OTHER RISKS INCLUDE:  Developing high blood pressure.  Developing gallbladder disease.  Women with a fibroid non-cancerous tumor on the uterus may develop pain, bleeding or increase growth of the fibroid. If you are taking HT, watch for signs of trouble. These include:  Abnormal bleeding.  Breast lumps, bloody discharge or red/painful breasts.  Shortness of breath.  Dizziness.  Abdominal pain.  Severe headaches.  Pain in your calves or chest. Report these signs to your caregiver right away. Also, talk with your caregiver about how often you should have an exam. DOES THE DURATION OF TAKING HT AFFECT BREAST CANCER RISK? The relationship between a woman's risk of developing breast cancer and the length of time that she receives HT is not clear. Some women take HT for only a few years until the worst of their menopausal symptoms have passed. Others have taken it for 10 years or more. Some researchers believe that there is little or no increased risk of breast cancer associated with short-term use of either HT with estrogen alone or estrogen combined with progestin. But long-term use is linked to an increased risk. Women on   HT should continue to do monthly self breast exams and get their mammograms as recommended by their caregiver. WHY IS MENOPAUSAL HORMONE THERAPY USED IN SPITE OF THE CANCER RISK? The known benefits of HT can improve the quality of life for many women, by reducing uncomfortable symptoms, as mentioned above. There also is evidence that HT helps prevent and treats osteoporosis. There is  preliminary evidence that it can help prevent other problems associated with age, including colon cancer. The addition of progestin to the treatment has greatly reduced the risk of uterine cancer. ARE THERE OTHER DRUG THERAPIES KNOWN TO TREAT CONDITIONS RELATED TO MENOPAUSE? A class of antidepressant drugs called Selective Serotonin Reuptake Inhibitors (SSRIs) are effective in treating menopause-related symptoms of depression or mood changes. Vitamin E and Clonidine (drug typically used for high blood pressure) can help reduce hot flashes. To prevent osteoporosis, women who are at high risk for bone loss may be given drugs such as bisphosphonates, alendronate, raloxifene, calcium with vitamin D, calcitonin, and prescription medicines such as fasomax or boneva. Lastly, a class of cholesterol-lowering drugs called HMG-CoA-reductase inhibitors (statins) are proven to be effective for reducing risk of heart disease. They are also being explored to prevent osteoporosis. No alternatives to estrogen exist for prevention of colon cancer  a disease for which early evidence suggests HT may be beneficial. WHO SHOULD NOT USE HT?  HT is often not recommended for women who have any of the following conditions:  Vaginal bleeding of an unknown cause.  Suspected breast cancer or history of breast cancer.  History of endometrial or uterine cancer.  Chronic disease of the liver.  History of heart disease.  History of blood clots in the veins or legs or in the lung (venous thrombosis). This includes women who have had thrombosis or blood clots during pregnancy or when taking birth control pills. Although the risk of blood clots in women is very low, HT increases the risk.  Severe or uncontrolled high blood pressure.  Anyone who may be pregnant. HOW CAN I SORT THROUGH THE BENEFITS AND RISKS TO MAKE A GOOD DECISION ABOUT WHETHER OR NOT TO USE POSTMENOPAUSAL HORMONE THERAPY? Here are several helpful points,  summarizing the findings of the Women's Health Initiative [facility]) study:  First, it is important to know that because the study involved healthy women, only a small number of them had either a negative or positive effect from estrogen plus progestin therapy. The percentages describe what would happen to a whole population, not necessarily to any individual woman. Second, remember that percentages are not fate. Whether expressing risks or benefits, a percentage does not mean you will develop a disease. Many factors affect that likelihood, including:  Your lifestyle.  Environmental factors.  Heredity.  Your personal medical history. Realize that most treatments carry risks and benefits. No one can make a treatment choice for you. Talk with your caregiver and decide what is best for your health and quality of life. Begin by finding out your family history and your personal risk profile for:  Heart disease.  Stroke.  Breast cancer.  Osteoporosis.  Colorectal cancer.  Blood clots.  Other medical conditions. Document Released: 06/02/2003 Document Revised: 11/26/2011 Document Reviewed: 07/04/2009 [facility] Patient Information 2013 [place], [place].   Menopause Menopause is the normal time of life when menstrual periods stop completely. Menopause is complete when you have missed 12 consecutive menstrual periods. It usually occurs between the ages of 3 to 76, with an average age of 40. Very rarely does  a woman develop menopause before 43 years old. At menopause, your ovaries stop producing the female hormones, estrogen and progesterone. This can cause undesirable symptoms and also affect your health. Sometimes the symptoms may occur 4 to 5 years before the menopause begins. There is no relationship between menopause and:  Oral contraceptives.  Number of children you had.  Race.  The age your menstrual periods started (menarche). Heavy smokers and very thin women may develop menopause  earlier in life. CAUSES  The ovaries stop producing the female hormones estrogen and progesterone.  Other causes include:  Surgery to remove both ovaries.  The ovaries stop functioning for no known reason.  Tumors of the pituitary gland in the brain.  Medical disease that affects the ovaries and hormone production.  Radiation treatment to the abdomen or pelvis.  Chemotherapy that affects the ovaries. SYMPTOMS   Hot flashes.  Night sweats.  Decrease in sex drive.  Vaginal dryness and thinning of the vagina causing painful intercourse.  Dryness of the skin and developing wrinkles.  Headaches.  Tiredness.  Irritability.  Memory problems.  Weight gain.  Bladder infections.  Hair growth of the face and chest.  Infertility. More serious symptoms include:  Loss of bone (osteoporosis) causing breaks (fractures).  Depression.  Hardening and narrowing of the arteries (atherosclerosis) causing heart attacks and strokes. DIAGNOSIS   When the menstrual periods have stopped for 12 straight months.  Physical exam.  Hormone studies of the blood. TREATMENT  There are many treatment choices and nearly as many questions about them. The decisions to treat or not to treat menopausal changes is an individual choice made with your caregiver. Your caregiver can discuss the treatments with you. Together, you can decide which treatment will work best for you. Your treatment choices may include:   Hormone therapy (estorgen and progesterone).  Non-hormonal medications.  Treating the individual symptoms with medication (for example antidepressants for depression).  Herbal medications that may help specific symptoms.  Counseling by a psychiatrist or psychologist.  Group therapy.  Lifestyle changes including:  Eating healthy.  Regular exercise.  Limiting caffeine and alcohol.  Stress management and meditation.  No treatment. HOME CARE INSTRUCTIONS   Take the  medication your caregiver gives you as directed.  Get plenty of sleep and rest.  Exercise regularly.  Eat a diet that contains calcium (good for the bones) and soy products (acts like estrogen hormone).  Avoid alcoholic beverages.  Do not smoke.  If you have hot flashes, dress in layers.  Take supplements, calcium and vitamin D to strengthen bones.  You can use over-the-counter lubricants or moisturizers for vaginal dryness.  Group therapy is sometimes very helpful.  Acupuncture may be helpful in some cases. SEEK MEDICAL CARE IF:   You are not sure you are in menopause.  You are having menopausal symptoms and need advice and treatment.  You are still having menstrual periods after age 36.  You have pain with intercourse.  Menopause is complete (no menstrual period for 12 months) and you develop vaginal bleeding.  You need a referral to a specialist (gynecologist, psychiatrist or psychologist) for treatment. SEEK IMMEDIATE MEDICAL CARE IF:   You have severe depression.  You have excessive vaginal bleeding.  You fell and think you have a broken bone.  You have pain when you urinate.  You develop leg or chest pain.  You have a fast pounding heart beat (palpitations).  You have severe headaches.  You develop vision problems.  You feel  a lump in your breast.  You have abdominal pain or severe indigestion. Document Released: 11/24/2003 Document Revised: 11/26/2011 Document Reviewed: 07/01/2008 [facility] Patient Information 2013 [place], [place].

## 2012-08-29 ENCOUNTER — Encounter: Payer: Self-pay | Admitting: Family Medicine

## 2012-09-01 ENCOUNTER — Encounter: Payer: Self-pay | Admitting: Internal Medicine

## 2012-09-01 ENCOUNTER — Ambulatory Visit (INDEPENDENT_AMBULATORY_CARE_PROVIDER_SITE_OTHER): Payer: BC Managed Care – PPO | Admitting: Internal Medicine

## 2012-09-01 VITALS — BP 105/72 | HR 79 | Temp 99.8°F | Ht 60.0 in | Wt 189.3 lb

## 2012-09-01 DIAGNOSIS — Z0000 Encounter for general adult medical examination without abnormal findings: Secondary | ICD-10-CM

## 2012-09-01 DIAGNOSIS — J40 Bronchitis, not specified as acute or chronic: Secondary | ICD-10-CM

## 2012-09-01 DIAGNOSIS — K589 Irritable bowel syndrome without diarrhea: Secondary | ICD-10-CM

## 2012-09-01 DIAGNOSIS — R7301 Impaired fasting glucose: Secondary | ICD-10-CM

## 2012-09-01 DIAGNOSIS — E039 Hypothyroidism, unspecified: Secondary | ICD-10-CM

## 2012-09-01 HISTORY — DX: Encounter for general adult medical examination without abnormal findings: Z00.00

## 2012-09-01 MED ORDER — AZITHROMYCIN 250 MG PO TABS: ORAL_TABLET | ORAL | Status: AC

## 2012-09-01 NOTE — Assessment & Plan Note (Signed)
Stable, controlled.  TSH is 2.87.   Repeat TSH in a year.   Continue synthroid.

## 2012-09-01 NOTE — Progress Notes (Signed)
Subjective:    Patient ID: Donna Davis, female    DOB: Mar 16, 1971, 41 y.o.   MRN: [id number]  CC: routine follow, establish with new PCP  HPI  Donna Davis is a pleasant 41 yo woman, this is my first time seeing her.  She presents with her husband and a sign language interpreter.   Donna Davis reports that for the last 4 weeks she has been struggling with bronchitis.  She notes that 4 weeks ago she began with a subjective fever, chills and URI symptoms including a cough.  She presented to the ED, where she had a chest xray and was given Rx for inhalers, tessalon perles, cough medicine with codeine.  She has been taking these medications, but only with some relief.  Today she reports that she continues to have a cough that occasionally keeps her up at night along with shortness of breath with walking, especially in the cold air, post nasal drip and a heavy sensation in her chest.  At the beginning of her symptoms, her cough was severe enough to cause a headache and nausea.  She denies sick contacts.    She is otherwise doing well.  She recently had a rotator cuff tear to her right shoulder, for which she is interested in PT.  She would only be interested in surgery if she were no longer able to sign.   She further has hypothyroidism, her last TSH being well controlled at 2.87.  She also has GERD which is controlled with zofran.   Donna Davis is concerned that she may have HTN or DM type 2.  She has a family history of both and suffers from occasional blurry vision and headaches.  She would like to be further evaluated.   Medications: Reviewed and updated in epic: multiple vitamins, synthroid, omega-3 fatty acids, zofran.   SH: Denies current smoking or ETOH use.  She is former smoker, but has been abstinent X 5 years.   Review of Systems  Constitutional: Positive for activity change. Negative for fever, chills, diaphoresis, appetite change and fatigue.  HENT: Positive for congestion, rhinorrhea, voice  change and postnasal drip. Negative for hearing loss, sore throat, facial swelling, sneezing, drooling, mouth sores, trouble swallowing, neck pain, dental problem, sinus pressure and ear discharge.   Eyes: Negative for pain and redness.  Respiratory: Positive for cough, chest tightness and shortness of breath. Negative for wheezing and stridor.   Cardiovascular: Positive for palpitations. Negative for chest pain and leg swelling.       Palpitations with albuterol  Gastrointestinal: Positive for diarrhea. Negative for nausea, abdominal pain, constipation and abdominal distention.  Genitourinary: Negative for dysuria and difficulty urinating.  Musculoskeletal: Negative for back pain and arthralgias.  Skin: Negative for color change, pallor and rash.  Neurological: Positive for headaches. Negative for dizziness and light-headedness.  Hematological: Negative for adenopathy.  Psychiatric/Behavioral: Negative for confusion and decreased concentration.       Objective:   Physical Exam  Constitutional: She is oriented to person, place, and time. She appears well-developed and well-nourished. No distress.       Patient is deaf, interpreter in room  HENT:  Head: Normocephalic and atraumatic.  Nose: Mucosal edema and rhinorrhea present. No sinus tenderness, nasal deformity or septal deviation. Right sinus exhibits no maxillary sinus tenderness and no frontal sinus tenderness. Left sinus exhibits no maxillary sinus tenderness and no frontal sinus tenderness.  Mouth/Throat: Uvula is midline. Mucous membranes are not pale, not dry and not cyanotic.  Posterior oropharyngeal erythema present. No oropharyngeal exudate.  Cardiovascular: Normal rate, regular rhythm, normal heart sounds and intact distal pulses.   No murmur heard. Pulmonary/Chest: Effort normal and breath sounds normal. No respiratory distress. She has no wheezes. She has no rales.  Abdominal: Soft. Bowel sounds are normal. She exhibits no  distension.  Musculoskeletal: She exhibits no edema and no tenderness.  Neurological: She is alert and oriented to person, place, and time.  Skin: Skin is warm and dry. She is not diaphoretic. No erythema.  Psychiatric: She has a normal mood and affect. Her behavior is normal.   Ordered Fasting BMP for tomorrow AM.      Assessment & Plan:  Please see problem oriented A/P.   Return to clinic at next available appointment in February.

## 2012-09-01 NOTE — Assessment & Plan Note (Signed)
As symptoms have continued, will plan for Z-pack and evaluate for improvement.    Patient to call in if symptoms do not start to resolve or worsen.

## 2012-09-01 NOTE — Assessment & Plan Note (Signed)
Donna Davis has one fasting lab with gluc > 127.  She has a strong family history of diabetes.   Ordered fasting BMP for tomorrow morning, which Donna Davis reports she can make it to.

## 2012-09-01 NOTE — Assessment & Plan Note (Addendum)
MMG - reported this year, normal; normal in 2011 Pelvic - hysterectomy, no cervix.  Sees OBGYN Flu - due, but as she is acutely ill, will defer  BP: Donna Davis is concerned that her BP has been elevated lately, would like to be evaluated for HTN.  Have requested that she check her BP ~ 2 times per week and bring in log to next visit for further evaluation.  Her BP's have been below 140/90 in the majority.   DEXA - at 41yo

## 2012-09-01 NOTE — Patient Instructions (Addendum)
Please schedule a follow up visit within the next 2 months, February.   For your medications:   Please bring all of your pill  Bottles with you to each visit.  This will help make sure that we have an up to date list of all the medications you are taking.  Please also bring any over the counter herbal medications you are taking (not including advil, tylenol, etc.)  Please continue taking your medications as prescribed.   You will have a prescription for Azithromycin 2 tabs on day one and then 1 tab for 4 more days.  Please let us know in the next couple of weeks if your symptoms do not improve and we may repeat a chest xray.   Please come in for fasting labs tomorrow or on a day convenient for you to check your blood sugar.   Please check your blood pressure a couple of times a week at different times of the day, please keep a record of these blood pressures to bring in for your next visit.  Your goal blood pressure should be below 140/90.   Thank you!

## 2012-09-01 NOTE — Assessment & Plan Note (Signed)
Symptoms are improved today.  Follows with gastroenterology.

## 2012-09-02 ENCOUNTER — Other Ambulatory Visit (INDEPENDENT_AMBULATORY_CARE_PROVIDER_SITE_OTHER): Payer: BC Managed Care – PPO

## 2012-09-02 DIAGNOSIS — R7301 Impaired fasting glucose: Secondary | ICD-10-CM

## 2012-09-02 DIAGNOSIS — E039 Hypothyroidism, unspecified: Secondary | ICD-10-CM

## 2012-09-02 LAB — COMPLETE METABOLIC PANEL WITH GFR
Alkaline Phosphatase: 41 U/L (ref 39–117)
Creat: 0.8 mg/dL (ref 0.50–1.10)
GFR, Est African American: >89 mL/min
GFR, Est Non African American: >89 mL/min
Glucose, Bld: 86 mg/dL (ref 70–99)
Sodium: 142 mEq/L (ref 135–145)
Total Bilirubin: 0.5 mg/dL (ref 0.3–1.2)
Total Protein: 6.7 g/dL (ref 6.0–8.3)

## 2012-09-17 HISTORY — PX: KNEE SURGERY: SHX244

## 2012-10-03 ENCOUNTER — Inpatient Hospital Stay (HOSPITAL_COMMUNITY)
Admission: AD | Admit: 2012-10-03 | Discharge: 2012-10-03 | Disposition: A | Discharge status: Home / Self Care | Payer: BC Managed Care – PPO | Source: Ambulatory Visit | Attending: Obstetrics & Gynecology | Admitting: Obstetrics & Gynecology

## 2012-10-03 ENCOUNTER — Inpatient Hospital Stay (HOSPITAL_COMMUNITY): Payer: BC Managed Care – PPO

## 2012-10-03 ENCOUNTER — Encounter (HOSPITAL_COMMUNITY): Payer: Self-pay

## 2012-10-03 DIAGNOSIS — R109 Unspecified abdominal pain: Secondary | ICD-10-CM | POA: Insufficient documentation

## 2012-10-03 DIAGNOSIS — N83201 Unspecified ovarian cyst, right side: Secondary | ICD-10-CM

## 2012-10-03 DIAGNOSIS — N83209 Unspecified ovarian cyst, unspecified side: Secondary | ICD-10-CM | POA: Insufficient documentation

## 2012-10-03 LAB — CBC
Platelets: 276 10*3/uL (ref 150–400)
RBC: 4.28 MIL/uL (ref 3.87–5.11)
RDW: 12.4 % (ref 11.5–15.5)
WBC: 11.6 10*3/uL — ABNORMAL HIGH (ref 4.0–10.5)

## 2012-10-03 LAB — URINALYSIS, ROUTINE W REFLEX MICROSCOPIC
Hgb urine dipstick: NEGATIVE
Leukocytes, UA: NEGATIVE
Nitrite: NEGATIVE
Protein, ur: NEGATIVE mg/dL
Specific Gravity, Urine: 1.02 (ref 1.005–1.030)
Urobilinogen, UA: 0.2 mg/dL (ref 0.0–1.0)

## 2012-10-03 LAB — POCT PREGNANCY, URINE: Preg Test, Ur: NEGATIVE

## 2012-10-03 MED ORDER — KETOROLAC TROMETHAMINE 60 MG/2ML IM SOLN: 60.0000 mg | Freq: Once | INTRAMUSCULAR | Status: DC

## 2012-10-03 MED ORDER — OXYCODONE-ACETAMINOPHEN 5-325 MG PO TABS: 1.0000 | ORAL_TABLET | Freq: Four times a day (QID) | ORAL | Status: DC | PRN

## 2012-10-03 MED ORDER — KETOROLAC TROMETHAMINE 60 MG/2ML IM SOLN
INTRAMUSCULAR | Status: AC
  Filled 2012-10-03: qty 2

## 2012-10-03 NOTE — MAU Note (Signed)
Pt states has had r lower abd pain, if she moves the wrong way, feels like a twisting, has urinary frequency, no burning though. Feels pelvic pressure, hx ovarian cysts, however has had hysterectomy, unsure if she still has partial ovary on her r side, does know her left ovary and tube have been removed as well as uterus. Does note odor to urine and is bright yellow at times.

## 2012-10-03 NOTE — MAU Provider Note (Signed)
History     CSN: [id number]  Arrival date and time: 10/03/12 1416   None     Chief Complaint  Patient presents with  . Abdominal Pain   HPI Donna Davis is a 42 y.o. [id number] who presents to MAU today with complaint of right sided lower abdominal pain. The patient states that the pain has been present for about 2 weeks. She has been taking tylenol, but that is no longer relieving her pain. The last time she took any tylenol was around 2:00 pm today. She rates her pain at 8/10 right now. The patient feels as though she may have been feverish this week, but has not taken a temperature. She denies N/V/D or change in appetite. She had a hysterectomy and left salping oophorectomy about 7 years ago. The right ovary and tube are still present. The patient does have a history of ovarian cysts, but states no issues since the hysterectomy.   OB History    Grav Para Term Preterm Abortions TAB SAB Ect Mult Living   3 2   1 1    2       Past Medical History  Diagnosis Date  . GERD (gastroesophageal reflux disease)   . Hypothyroidism   . IBS (irritable bowel syndrome)   . Anxiety   . Hearing difficulty     b/l, 2/2 congenital rubella s/p stapedectomy. Using hearing aid  . Headache 04/2005    h/o headache in past that raised question of psuedotumor s/p therapeutic LP with an opening preassure of 18 cm H2O   . Hepatic cyst     6 mm on CT done done in 05/2005  . Family history of malignant neoplasm of gastrointestinal tract     Past Surgical History  Procedure Date  . Abdominal hysterectomy     partial hysterecotmy 2003 for endoemtriosis with residula right ovaey. Multiple GYN surgeries for chronic pelvic pain  . Cesarean section 1994  . Stapedectomy 2007    Dr. Ezzard Standing, left ear  . Laparoscopic ovarian cystectomy 2001  . Thyroid nodule removed 1997  . Total abdominal hysterectomy w/ bilateral salpingoophorectomy     left    Family History  Problem Relation Age of Onset  . Breast  cancer Mother   . Ovarian cancer Mother   . Diabetes Mother   . Stomach cancer Maternal Grandmother   . Colon cancer Maternal Grandmother   . Diabetes Maternal Grandmother   . Pancreatic cancer Father   . Prostate cancer Father   . Colon cancer Father   . Diabetes Father   . Heart disease Father   . Irritable bowel syndrome Father   . Kidney disease Father   . Heart disease      both grandmothers  . Bipolar disorder Daughter     History  Substance Use Topics  . Smoking status: Former Smoker    Types: Cigarettes  . Smokeless tobacco: Not on file     Comment: quit 51yrs ago  . Alcohol Use: No    Allergies:  Allergies  Allergen Reactions  . Onion Anaphylaxis  . Shellfish Allergy Anaphylaxis and Swelling  . Amoxicillin-Pot Clavulanate Nausea And Vomiting  . Cefuroxime Nausea And Vomiting  . Cephalexin Other (See Comments)     gi upset  . Ciprofloxacin Other (See Comments)     Fevers  . Clarithromycin Other (See Comments)     gi upset  . Clarithromycin Nausea And Vomiting  . Doxycycline Other (See Comments)  gi upset  . Other Other (See Comments)    Steroids Went crazy  . Prednisone Other (See Comments)     delusions  . Shrimp Flavor Other (See Comments)    unknown  . Sulfonamide Derivatives Other (See Comments)    vaginal blisters  . Tramadol Hcl Other (See Comments)     "makes me nuts"    Prescriptions prior to admission  Medication Sig Dispense Refill  . acetaminophen (TYLENOL) 500 MG tablet Take 1,000 mg by mouth every 6 (six) hours as needed. For pain      . Ascorbic Acid (VITAMIN C PO) Take 1 tablet by mouth daily.      . Ascorbic Acid (VITAMIN C) 100 MG tablet Take 100 mg by mouth daily.      Marland Kitchen BENADRYL 25 MG tablet Take 1 tablet (25 mg total) by mouth every 6 (six) hours as needed for itching.  20 tablet  0  . benzonatate (TESSALON) 100 MG capsule Take 1 capsule (100 mg total) by mouth every 8 (eight) hours.  21 capsule  0  . cholecalciferol  (VITAMIN D) 1000 UNITS tablet Take 1,000 Units by mouth daily.      . cyclobenzaprine (FLEXERIL) 5 MG tablet       . FOLIC ACID PO Take 1 tablet by mouth daily.      Marland Kitchen guaiFENesin-codeine 100-10 MG/5ML syrup Take 5 mLs by mouth 3 (three) times daily as needed for cough.  120 mL  0  . HYDROcodone-acetaminophen (NORCO/VICODIN) 5-325 MG per tablet       . levothyroxine (SYNTHROID, LEVOTHROID) 25 MCG tablet Take 1 tablet (25 mcg total) by mouth daily.  31 tablet  11  . meloxicam (MOBIC) 7.5 MG tablet       . Norethindrone-Ethinyl Estradiol-Fe Biphas (LO LOESTRIN FE) 1 MG-10 MCG / 10 MCG tablet Take 1 tablet by mouth daily.  3 Package  4  . Omega-3 Fatty Acids (FISH OIL PO) Take 2 capsules by mouth daily.      . ondansetron (ZOFRAN) 4 MG tablet Take 1 tablet (4 mg total) by mouth every 6 (six) hours.  12 tablet  0  . vitamin B-12 (CYANOCOBALAMIN) 100 MCG tablet Take 50 mcg by mouth daily.        ROS All negative unless otherwise noted in HPI Physical Exam   Blood pressure 129/83, pulse 86, temperature 99.2 F (37.3 C), temperature source Oral, resp. rate 18, height 5' (1.524 m), weight 192 lb 2 oz (87.147 kg), last menstrual period 11/28/2003.  Physical Exam  Constitutional: She is oriented to person, place, and time. She appears well-developed and well-nourished. No distress.  HENT:  Head: Normocephalic and atraumatic.  Cardiovascular: Normal rate, regular rhythm and normal heart sounds.   Respiratory: Effort normal and breath sounds normal. No respiratory distress.  GI: Soft. Bowel sounds are normal. She exhibits no distension and no mass. There is tenderness (moderate right lower quadrant tenderness to palpation). There is no rebound and no guarding.  Neurological: She is alert and oriented to person, place, and time.  Skin: Skin is warm and dry. No erythema.  Psychiatric: She has a normal mood and affect.   Results for orders placed during the hospital encounter of 10/03/12 (from the  past 24 hour(s))  URINALYSIS, ROUTINE W REFLEX MICROSCOPIC     Status: Normal   Collection Time   10/03/12  2:30 PM      Component Value Range   Color, Urine YELLOW  YELLOW  APPearance CLEAR  CLEAR   Specific Gravity, Urine 1.020  1.005 - 1.030   pH 5.5  5.0 - 8.0   Glucose, UA NEGATIVE  NEGATIVE mg/dL   Hgb urine dipstick NEGATIVE  NEGATIVE   Bilirubin Urine NEGATIVE  NEGATIVE   Ketones, ur NEGATIVE  NEGATIVE mg/dL   Protein, ur NEGATIVE  NEGATIVE mg/dL   Urobilinogen, UA 0.2  0.0 - 1.0 mg/dL   Nitrite NEGATIVE  NEGATIVE   Leukocytes, UA NEGATIVE  NEGATIVE  POCT PREGNANCY, URINE     Status: Normal   Collection Time   10/03/12  3:10 PM      Component Value Range   Preg Test, Ur NEGATIVE  NEGATIVE  CBC     Status: Abnormal   Collection Time   10/03/12  5:18 PM      Component Value Range   WBC 11.6 (*) 4.0 - 10.5 K/uL   RBC 4.28  3.87 - 5.11 MIL/uL   Hemoglobin 13.1  12.0 - 15.0 g/dL   HCT 82.9  56.2 - 13.0 %   MCV 91.6  78.0 - 100.0 fL   MCH 30.6  26.0 - 34.0 pg   MCHC 33.4  30.0 - 36.0 g/dL   RDW 86.5  78.4 - 69.6 %   Platelets 276  150 - 400 K/uL    *RADIOLOGY REPORT*   Clinical Data: 42 year old female with right pelvic pain. History  of hysterectomy and left oophorectomy.   TRANSABDOMINAL AND TRANSVAGINAL ULTRASOUND OF PELVIS   Technique: Both transabdominal and transvaginal ultrasound  examinations of the pelvis were performed. Transabdominal technique  was performed for global imaging of the pelvis including uterus,  ovaries, adnexal regions, and pelvic cul-de-sac.   It was necessary to proceed with endovaginal exam following the  transabdominal exam to visualize the right ovary.   Comparison: 12/05/2009 ultrasound.   Findings:  Uterus: The uterus is not visualized compatible with hysterectomy.   Right ovary: The right ovary measures 1.8 x 1.7 x 1.8 cm. A  simple cyst measuring 1.4 x 1.6 x 1.3 cm is identified.   Left ovary: The left ovary is not  identified compatible with  oophorectomy.   Other findings: A trace amount of free fluid is identified.  There is no evidence of adnexal mass.   IMPRESSION:  1.4 x 1.6 x 1.3 cm simple appearing right ovarian cyst.  Trace amount of free fluid which may be physiologic.  Status post hysterectomy and left oophorectomy.   Original Report Authenticated By: Harmon Pier, M.D.  MAU Course  Procedures None  MDM Discussed patient with Dr. Emelda Fear. He feels comfortable sending patient home. Low likelihood of appendicitis based on physical exam, patient symptoms and only slightly elevated WBCs.  The patient has taken percocet before without any adverse reaction.  Assessment and Plan  A: Right simple ovarian cyst measuring 1.4 x 1.6 x 1.3 cm  P: Discharge home Rx for percocet given to patient. Discussed that patient may take advil for break-through pain if necessary, but to avoid additional tylenol Patient will be contacted to follow-up in clinic in 2-3 weeks. She is a current patient.  Patient may return to MAU as needed or if symptoms worsen  Freddi Starr, PA-C 10/03/2012, 4:39 PM

## 2012-10-04 NOTE — MAU Provider Note (Signed)
Attestation of Attending Supervision of Advanced Practitioner: Evaluation and management procedures were performed by the PA/NP/CNM/OB Fellow under my supervision/collaboration. Chart reviewed and agree with management and plan.  Jinx Gilden V 10/04/2012 12:05 AM

## 2012-10-22 ENCOUNTER — Encounter: Payer: BC Managed Care – PPO | Admitting: Obstetrics & Gynecology

## 2012-10-23 ENCOUNTER — Inpatient Hospital Stay (HOSPITAL_COMMUNITY)
Admission: AD | Admit: 2012-10-23 | Discharge: 2012-10-23 | Disposition: A | Discharge status: Home / Self Care | Payer: BC Managed Care – PPO | Source: Ambulatory Visit | Attending: Obstetrics & Gynecology | Admitting: Obstetrics & Gynecology

## 2012-10-23 ENCOUNTER — Encounter (HOSPITAL_COMMUNITY): Payer: Self-pay | Admitting: *Deleted

## 2012-10-23 ENCOUNTER — Inpatient Hospital Stay (HOSPITAL_COMMUNITY): Payer: BC Managed Care – PPO

## 2012-10-23 DIAGNOSIS — R1031 Right lower quadrant pain: Secondary | ICD-10-CM | POA: Insufficient documentation

## 2012-10-23 DIAGNOSIS — K219 Gastro-esophageal reflux disease without esophagitis: Secondary | ICD-10-CM | POA: Insufficient documentation

## 2012-10-23 DIAGNOSIS — E039 Hypothyroidism, unspecified: Secondary | ICD-10-CM | POA: Insufficient documentation

## 2012-10-23 DIAGNOSIS — K469 Unspecified abdominal hernia without obstruction or gangrene: Secondary | ICD-10-CM

## 2012-10-23 DIAGNOSIS — R109 Unspecified abdominal pain: Secondary | ICD-10-CM

## 2012-10-23 LAB — URINALYSIS, ROUTINE W REFLEX MICROSCOPIC
Bilirubin Urine: NEGATIVE
Leukocytes, UA: NEGATIVE
Nitrite: NEGATIVE
Specific Gravity, Urine: 1.02 (ref 1.005–1.030)
Urobilinogen, UA: 0.2 mg/dL (ref 0.0–1.0)
pH: 5.5 (ref 5.0–8.0)

## 2012-10-23 LAB — CBC WITH DIFFERENTIAL/PLATELET
Basophils Relative: 0 % (ref 0–1)
Eosinophils Absolute: 0.2 10*3/uL (ref 0.0–0.7)
Hemoglobin: 13.4 g/dL (ref 12.0–15.0)
Lymphs Abs: 3.7 10*3/uL (ref 0.7–4.0)
MCH: 30.9 pg (ref 26.0–34.0)
Monocytes Relative: 6 % (ref 3–12)
Neutro Abs: 5.5 10*3/uL (ref 1.7–7.7)
Neutrophils Relative %: 55 % (ref 43–77)
Platelets: 249 10*3/uL (ref 150–400)
RBC: 4.33 MIL/uL (ref 3.87–5.11)

## 2012-10-23 MED ORDER — OXYCODONE-ACETAMINOPHEN 5-325 MG PO TABS: 2.0000 | ORAL_TABLET | ORAL | Status: DC | PRN

## 2012-10-23 MED ORDER — SODIUM CHLORIDE 0.9 % IJ SOLN
INTRAMUSCULAR | Status: AC
  Filled 2012-10-23: qty 6

## 2012-10-23 MED ORDER — IOHEXOL 300 MG/ML  SOLN: 50.0000 mL | INTRAMUSCULAR | Status: DC

## 2012-10-23 MED ORDER — SODIUM CHLORIDE 0.9 % IJ SOLN
INTRAMUSCULAR | Status: AC
  Filled 2012-10-23: qty 3

## 2012-10-23 MED ORDER — IOHEXOL 300 MG/ML  SOLN
100.0000 mL | Freq: Once | INTRAMUSCULAR | Status: AC | PRN
  Administered 2012-10-23: 100 mL via INTRAVENOUS

## 2012-10-23 MED ORDER — IOHEXOL 300 MG/ML  SOLN: 50.0000 mL | INTRAMUSCULAR | Status: AC

## 2012-10-23 MED ORDER — OXYCODONE-ACETAMINOPHEN 5-325 MG PO TABS
1.0000 | ORAL_TABLET | Freq: Four times a day (QID) | ORAL | Status: DC | PRN
  Administered 2012-10-23: 2 via ORAL
  Filled 2012-10-23: qty 2

## 2012-10-23 NOTE — MAU Provider Note (Signed)
CC: Abdominal Pain    First Provider Initiated Contact with Patient 10/23/12 1429      HPI Donna Davis is a 42 y.o. [id number]  who presents with onset of severe RLQ pain last night. She states she thinks it is a ruptured right ovarian cyst. She was seen here 2 weeks ago with RLQ pain and diagnosed with small (1.5cm) simple cyst rt ovary. She has taken ibuprofen and Percocet continually over the past 2 weeks. Slight nausea at times, no vomiting/constipation/diarrhea. Has had multiple abdominal surgeries including hysterectomy and left oophorectomy. Not sexually active x past several months.    Past Medical History  Diagnosis Date  . GERD (gastroesophageal reflux disease)   . Hypothyroidism   . IBS (irritable bowel syndrome)   . Anxiety   . Hearing difficulty     b/l, 2/2 congenital rubella s/p stapedectomy. Using hearing aid  . Headache 04/2005    h/o headache in past that raised question of psuedotumor s/p therapeutic LP with an opening preassure of 18 cm H2O   . Hepatic cyst     6 mm on CT done done in 05/2005  . Family history of malignant neoplasm of gastrointestinal tract     OB History    Grav Para Term Preterm Abortions TAB SAB Ect Mult Living   3 2   1 1    2      # Outc Date GA Lbr Len/2nd Wgt Sex Del Anes PTL Lv   1 PAR            2 PAR            3 TAB               Past Surgical History  Procedure Date  . Abdominal hysterectomy     partial hysterecotmy 2003 for endoemtriosis with residula right ovaey. Multiple GYN surgeries for chronic pelvic pain  . Cesarean section 1994  . Stapedectomy 2007    Dr. Ezzard Standing, left ear  . Laparoscopic ovarian cystectomy 2001  . Thyroid nodule removed 1997  . Total abdominal hysterectomy w/ bilateral salpingoophorectomy     left    History   Social History  . Marital Status: Married    Spouse Name: N/A    Number of Children: 2  . Years of Education: N/A   Occupational History  . student    Social History Main Topics  .  Smoking status: Former Smoker    Types: Cigarettes  . Smokeless tobacco: Not on file     Comment: quit 37yrs ago  . Alcohol Use: No  . Drug Use: No  . Sexually Active: Yes   Other Topics Concern  . Not on file   Social History Narrative   Current smoker, not using alcohol or drugs at this timeLives with husband and 2 children    No current facility-administered medications on file prior to encounter.   Current Outpatient Prescriptions on File Prior to Encounter  Medication Sig Dispense Refill  . Ascorbic Acid (VITAMIN C) 100 MG tablet Take 100 mg by mouth daily.      . cholecalciferol (VITAMIN D) 1000 UNITS tablet Take 1,000 Units by mouth daily.      Marland Kitchen FOLIC ACID PO Take 1 tablet by mouth daily.      Marland Kitchen levothyroxine (SYNTHROID, LEVOTHROID) 25 MCG tablet Take 1 tablet (25 mcg total) by mouth daily.  31 tablet  11  . Norethindrone-Ethinyl Estradiol-Fe Biphas (LO LOESTRIN FE) 1 MG-10 MCG /  10 MCG tablet Take 1 tablet by mouth daily.  3 Package  4  . Omega-3 Fatty Acids (FISH OIL PO) Take 2 capsules by mouth daily.      Marland Kitchen oxyCODONE-acetaminophen (PERCOCET/ROXICET) 5-325 MG per tablet Take 1-2 tablets by mouth every 6 (six) hours as needed for pain.  15 tablet  0  . vitamin B-12 (CYANOCOBALAMIN) 100 MCG tablet Take 50 mcg by mouth daily.        Allergies  Allergen Reactions  . Onion Anaphylaxis  . Shellfish Allergy Anaphylaxis and Swelling  . Amoxicillin-Pot Clavulanate Nausea And Vomiting  . Cefuroxime Nausea And Vomiting  . Cephalexin Other (See Comments)     gi upset  . Ciprofloxacin Other (See Comments)     Fevers  . Clarithromycin Other (See Comments)     gi upset  . Clarithromycin Nausea And Vomiting    Pt is OK with azithromycin  . Doxycycline Other (See Comments)     gi upset  . Other Other (See Comments)    Steroids Went crazy  . Prednisone Other (See Comments)     delusions  . Shrimp Flavor Other (See Comments)    unknown  . Sulfonamide Derivatives Other (See  Comments)    vaginal blisters  . Tramadol Hcl Other (See Comments)     "makes me nuts"    ROS Pertinent items in HPI. Denies urinary or vaginitis sx.   PHYSICAL EXAM Filed Vitals:   10/23/12 1449  BP:   Pulse:   Temp: 100 F (37.8 C)  Resp:    General: Obese female in no acute distress Cardiovascular: Normal rate Respiratory: Normal effort Abdomen: Soft, obese, quite tender localized to RLQ at McBurney's point, voluntary guarding, no rebound Back: No CVAT Extremities: No edema Neurologic: Alert and oriented  LAB RESULTS Results for orders placed during the hospital encounter of 10/23/12 (from the past 24 hour(s))  URINALYSIS, ROUTINE W REFLEX MICROSCOPIC     Status: Normal   Collection Time   10/23/12  1:55 PM      Component Value Range   Color, Urine YELLOW  YELLOW   APPearance CLEAR  CLEAR   Specific Gravity, Urine 1.020  1.005 - 1.030   pH 5.5  5.0 - 8.0   Glucose, UA NEGATIVE  NEGATIVE mg/dL   Hgb urine dipstick NEGATIVE  NEGATIVE   Bilirubin Urine NEGATIVE  NEGATIVE   Ketones, ur NEGATIVE  NEGATIVE mg/dL   Protein, ur NEGATIVE  NEGATIVE mg/dL   Urobilinogen, UA 0.2  0.0 - 1.0 mg/dL   Nitrite NEGATIVE  NEGATIVE   Leukocytes, UA NEGATIVE  NEGATIVE  CBC WITH DIFFERENTIAL     Status: Abnormal   Collection Time   10/23/12  3:25 PM      Component Value Range   WBC 10.0  4.0 - 10.5 K/uL   RBC 4.33  3.87 - 5.11 MIL/uL   Hemoglobin 13.4  12.0 - 15.0 g/dL   HCT 16.1 (*) 09.6 - 04.5 %   MCV 111.1 (*) 78.0 - 100.0 fL   MCH 30.9  26.0 - 34.0 pg   MCHC 27.9 (*) 30.0 - 36.0 g/dL   RDW 40.9  81.1 - 91.4 %   Platelets 249  150 - 400 K/uL   Neutrophils Relative 55  43 - 77 %   Neutro Abs 5.5  1.7 - 7.7 K/uL   Lymphocytes Relative 37  12 - 46 %   Lymphs Abs 3.7  0.7 - 4.0 K/uL   Monocytes  Relative 6  3 - 12 %   Monocytes Absolute 0.6  0.1 - 1.0 K/uL   Eosinophils Relative 2  0 - 5 %   Eosinophils Absolute 0.2  0.0 - 0.7 K/uL   Basophils Relative 0  0 - 1 %   Basophils  Absolute 0.0  0.0 - 0.1 K/uL    IMAGING US Transvaginal Non-ob  10/03/2012  *RADIOLOGY REPORT*  Clinical Data: 41 year old female with right pelvic pain. History of hysterectomy and left oophorectomy.  TRANSABDOMINAL AND TRANSVAGINAL ULTRASOUND OF PELVIS Technique:  Both transabdominal and transvaginal ultrasound examinations of the pelvis were performed. Transabdominal technique was performed for global imaging of the pelvis including uterus, ovaries, adnexal regions, and pelvic cul-de-sac.  It was necessary to proceed with endovaginal exam following the transabdominal exam to visualize the right ovary.  Comparison:  12/05/2009 ultrasound.  Findings:  Uterus: The uterus is not visualized compatible with hysterectomy.  Right ovary:  The right ovary measures 1.8 x 1.7 x 1.8 cm.  A simple cyst measuring 1.4 x 1.6 x 1.3 cm is identified.  Left ovary: The left ovary is not identified compatible with oophorectomy.  Other findings: A trace amount of free fluid is identified. There is no evidence of adnexal mass.  IMPRESSION: 1.4 x 1.6 x 1.3 cm simple appearing right ovarian cyst.  Trace amount of free fluid which may be physiologic.  Status post hysterectomy and left oophorectomy.   Original Report Authenticated By: Harmon Pier, M.D.    US Pelvis Complete  10/03/2012  *RADIOLOGY REPORT*  Clinical Data: 42 year old female with right pelvic pain.  History of hysterectomy and left oophorectomy.  TRANSABDOMINAL AND TRANSVAGINAL ULTRASOUND OF PELVIS Technique:  Both transabdominal and transvaginal ultrasound examinations of the pelvis were performed. Transabdominal technique was performed for global imaging of the pelvis including uterus, ovaries, adnexal regions, and pelvic cul-de-sac.  It was necessary to proceed with endovaginal exam following the transabdominal exam to visualize the right ovary.  Comparison:  12/05/2009 ultrasound.  Findings:  Uterus: The uterus is not visualized compatible with hysterectomy.  Right  ovary:  The right ovary measures 1.8 x 1.7 x 1.8 cm.  A simple cyst measuring 1.4 x 1.6 x 1.3 cm is identified.  Left ovary: The left ovary is not identified compatible with oophorectomy.  Other findings: A trace amount of free fluid is identified. There is no evidence of adnexal mass.  IMPRESSION: 1.4 x 1.6 x 1.3 cm simple appearing right ovarian cyst.  Trace amount of free fluid which may be physiologic.  Status post hysterectomy and left oophorectomy.   Original Report Authenticated By: Harmon Pier, M.D.     MAU COURSE CT abd and pelvis done  ASSESSMENT  No diagnosis found.  PLAN C/W Dr. Debroah Loop Discharge home. See AVS for patient education.    Medication List     As of 10/23/2012  7:18 PM    ASK your doctor about these medications         cholecalciferol 1000 UNITS tablet   Commonly known as: VITAMIN D   Take 1,000 Units by mouth daily.      FISH OIL PO   Take 2 capsules by mouth daily.      FOLIC ACID PO   Take 1 tablet by mouth daily.      levothyroxine 25 MCG tablet   Commonly known as: SYNTHROID, LEVOTHROID   Take 1 tablet (25 mcg total) by mouth daily.      Norethindrone-Ethinyl Estradiol-Fe Biphas 1 MG-10  MCG / 10 MCG tablet   Commonly known as: LO LOESTRIN FE   Take 1 tablet by mouth daily.      oxyCODONE-acetaminophen 5-325 MG per tablet   Commonly known as: PERCOCET/ROXICET   Take 1-2 tablets by mouth every 6 (six) hours as needed for pain.      vitamin B-12 100 MCG tablet   Commonly known as: CYANOCOBALAMIN   Take 50 mcg by mouth daily.      vitamin C 100 MG tablet   Take 100 mg by mouth daily.           Danae Orleans, CNM 10/23/2012 2:52 PM  Awaiting CT result. Care as soon and I hope Damian Leavell, FNP at 2000 RADIOLOGY REPORT*  Clinical Data: Right lower quadrant pain. Question appendicitis.  CT ABDOMEN AND PELVIS WITH CONTRAST  Technique: Multidetector CT imaging of the abdomen and pelvis was  performed following the standard protocol during bolus   administration of intravenous contrast.  Contrast: OMNIPAQUE IOHEXOL 300 MG/ML SOLN  Comparison: CT abdomen and pelvis 12/26/2007.  Findings: Mild dependent atelectasis is seen. No pleural or  pericardial effusion.  The gallbladder, liver, spleen, adrenal glands, pancreas, kidneys  and biliary tree appear normal.  The appendix appears normal. The stomach and small and large bowel  appear normal. Several small right lower quadrant lymph nodes are  seen and not markedly changed. The patient is status post  hysterectomy. No bony abnormality is identified.  IMPRESSION:  Negative for appendicitis. No acute finding.   Assessment:  Abdominal pain  Plan:  CT scan = normal   Discussed with Dr. Debroah Loop   Patient to continue ibuprofen and pain medication   Follow up with PCP   Patient has follow up in GYN Clinic

## 2012-10-23 NOTE — MAU Note (Signed)
Some nausea, denies vomiting, constipation or diarrhea.  No problems with urination.

## 2012-10-23 NOTE — MAU Note (Signed)
Dx with an ovarian cyst.  Has  appt at GYN clinic.  Pain got worse last night, feels like being stabbed with a hot knife.

## 2012-11-03 ENCOUNTER — Ambulatory Visit: Payer: BC Managed Care – PPO | Admitting: Internal Medicine

## 2012-11-06 ENCOUNTER — Ambulatory Visit (INDEPENDENT_AMBULATORY_CARE_PROVIDER_SITE_OTHER): Payer: BC Managed Care – PPO | Admitting: Obstetrics & Gynecology

## 2012-11-06 ENCOUNTER — Encounter: Payer: Self-pay | Admitting: Obstetrics & Gynecology

## 2012-11-06 VITALS — BP 133/89 | HR 89 | Temp 99.4°F | Wt 189.5 lb

## 2012-11-06 DIAGNOSIS — Z708 Other sex counseling: Secondary | ICD-10-CM

## 2012-11-06 LAB — FOLLICLE STIMULATING HORMONE: FSH: 26.6 m[IU]/mL

## 2012-11-06 NOTE — Progress Notes (Signed)
Subjective:     Patient ID: Donna Davis, female   DOB: June 03, 1971, 42 y.o.   MRN: [id number]  HPI Pt presents with request for STI screening.  She was seen in the MAU and dx's with ovarian cyst.  She was not tested at the time and later discovered that her spouse has been unfaithful and she wants a full STI screen.  Pt denies sx currently.     Pt also c/o vasomotor sx.  She was told that her last remaining ovary does not work.    Review of Systems     Objective:   Physical Exam BP 133/89  Pulse 89  Temp(Src) 99.4 F (37.4 C) (Oral)  Wt 189 lb 8 oz (85.957 kg)  BMI 37.01 kg/m2  LMP 11/28/2003 GU: EGBUS: no lesions Vagina: no blood in vault; no abnormal discharge; cuff well healed        Assessment:     STI counseling  Vasomotor sx- possible early menopause     Plan:     Cx: GC and chlmydia Serum: HIV, PRP & hepatitis panel FSH F/u prn Pt encouraged to sign up for [employer] [facility]. Harraway-Smith, M.D., [place] Core

## 2012-11-06 NOTE — Addendum Note (Signed)
Addended by: Faythe Casa on: 11/06/2012 04:31 PM   Modules accepted: Orders

## 2012-11-06 NOTE — Patient Instructions (Signed)
Sexually Transmitted Disease  Sexually transmitted disease (STD) refers to any infection that is passed from person to person during sexual activity. This may happen by way of saliva, semen, blood, vaginal mucus, or urine. Common STDs include:   Gonorrhea.   Chlamydia.   Syphilis.   HIV/AIDS.   Genital herpes.   Hepatitis B and C.   Trichomonas.   Human papillomavirus (HPV).   Pubic lice.  CAUSES   An STD may be spread by bacteria, virus, or parasite. A person can get an STD by:   Sexual intercourse with an infected person.   Sharing sex toys with an infected person.   Sharing needles with an infected person.   Having intimate contact with the genitals, mouth, or rectal areas of an infected person.  SYMPTOMS   Some people may not have any symptoms, but they can still pass the infection to others. Different STDs have different symptoms. Symptoms include:   Painful or bloody urination.   Pain in the pelvis, abdomen, vagina, anus, throat, or eyes.   Skin rash, itching, irritation, growths, or sores (lesions). These usually occur in the genital or anal area.   Abnormal vaginal discharge.   Penile discharge in men.   Soft, flesh-colored skin growths in the genital or anal area.   Fever.   Pain or bleeding during sexual intercourse.   Swollen glands in the groin area.   Yellow skin and eyes (jaundice). This is seen with hepatitis.  DIAGNOSIS   To make a diagnosis, your caregiver may:   Take a medical history.   Perform a physical exam.   Take a specimen (culture) to be examined.   Examine a sample of discharge under a microscope.   Perform blood tests.   Perform a Pap test, if this applies.   Perform a colposcopy.   Perform a laparoscopy.  TREATMENT    Chlamydia, gonorrhea, trichomonas, and syphilis can be cured with antibiotic medicine.   Genital herpes, hepatitis, and HIV can be treated, but not cured, with prescribed medicines. The medicines will lessen the symptoms.   Genital warts  from HPV can be treated with medicine or by freezing, burning (electrocautery), or surgery. Warts may come back.   HPV is a virus and cannot be cured with medicine or surgery.However, abnormal areas may be followed very closely by your caregiver and may be removed from the cervix, vagina, or vulva through office procedures or surgery.  If your diagnosis is confirmed, your recent sexual partners need treatment. This is true even if they are symptom-free or have a negative culture or evaluation. They should not have sex until their caregiver says it is okay.  HOME CARE INSTRUCTIONS   All sexual partners should be informed, tested, and treated for all STDs.   Take your antibiotics as directed. Finish them even if you start to feel better.   Only take over-the-counter or prescription medicines for pain, discomfort, or fever as directed by your caregiver.   Rest.   Eat a balanced diet and drink enough fluids to keep your urine clear or pale yellow.   Do not have sex until treatment is completed and you have followed up with your caregiver. STDs should be checked after treatment.   Keep all follow-up appointments, Pap tests, and blood tests as directed by your caregiver.   Only use latex condoms and water-soluble lubricants during sexual activity. Do not use petroleum jelly or oils.   Avoid alcohol and illegal drugs.   Get vaccinated   for HPV and hepatitis. If you have not received these vaccines in the past, talk to your caregiver about whether one or both might be right for you.   Avoid risky sex practices that can break the skin.  The only way to avoid getting an STD is to avoid all sexual activity.Latex condoms and dental dams (for oral sex) will help lessen the risk of getting an STD, but will not completely eliminate the risk.  SEEK MEDICAL CARE IF:    You have a fever.   You have any new or worsening symptoms.  Document Released: 11/24/2002 Document Revised: 11/26/2011 Document Reviewed:  12/01/2010  [employer] Patient Information 2013 [employer].

## 2012-11-07 ENCOUNTER — Encounter: Payer: Self-pay | Admitting: Internal Medicine

## 2012-11-07 LAB — HEPATITIS PANEL, ACUTE
HCV Ab: NEGATIVE
Hep A IgM: NEGATIVE

## 2012-11-12 ENCOUNTER — Encounter: Payer: Self-pay | Admitting: Obstetrics & Gynecology

## 2012-11-12 ENCOUNTER — Telehealth: Payer: Self-pay | Admitting: General Practice

## 2012-11-12 NOTE — Telephone Encounter (Signed)
Message copied by Kathee Delton on Wed Nov 12, 2012  9:04 AM ------      Message from: Willodean Rosenthal      Created: Tue Nov 11, 2012  2:59 PM       Please call pt and notify her that her entire STI panel is negative.            Thx,      clh-S  ------

## 2012-11-12 NOTE — Telephone Encounter (Deleted)
Message copied by Jill Side on Wed Nov 12, 2012 11:58 AM ------      Message from: Willodean Rosenthal      Created: Tue Nov 11, 2012  2:59 PM       Please call pt and notify her that her entire STI panel is negative.            Thx,      clh-S  ------

## 2012-11-12 NOTE — Telephone Encounter (Signed)
Called pt and left message on her personal voice mail that all of her lab test results were negative- meaning that she does not have any sexually transmitted infections. She may call back if she has additional questions.

## 2012-11-14 ENCOUNTER — Encounter: Payer: Self-pay | Admitting: Internal Medicine

## 2012-11-18 ENCOUNTER — Other Ambulatory Visit: Payer: Self-pay | Admitting: Internal Medicine

## 2012-11-18 ENCOUNTER — Other Ambulatory Visit: Payer: Self-pay | Admitting: *Deleted

## 2012-11-18 MED ORDER — ALPRAZOLAM 0.25 MG PO TABS: 0.2500 mg | ORAL_TABLET | Freq: Every evening | ORAL | Status: DC | PRN

## 2012-11-19 NOTE — Telephone Encounter (Signed)
Called to pharm 

## 2012-12-01 ENCOUNTER — Emergency Department (HOSPITAL_COMMUNITY): Payer: BC Managed Care – PPO

## 2012-12-01 ENCOUNTER — Emergency Department (HOSPITAL_COMMUNITY)
Admission: EM | Admit: 2012-12-01 | Discharge: 2012-12-01 | Disposition: A | Discharge status: Home / Self Care | Payer: BC Managed Care – PPO | Attending: Emergency Medicine | Admitting: Emergency Medicine

## 2012-12-01 ENCOUNTER — Encounter (HOSPITAL_COMMUNITY): Payer: Self-pay | Admitting: *Deleted

## 2012-12-01 DIAGNOSIS — Z8679 Personal history of other diseases of the circulatory system: Secondary | ICD-10-CM | POA: Insufficient documentation

## 2012-12-01 DIAGNOSIS — Z800 Family history of malignant neoplasm of digestive organs: Secondary | ICD-10-CM | POA: Insufficient documentation

## 2012-12-01 DIAGNOSIS — H9190 Unspecified hearing loss, unspecified ear: Secondary | ICD-10-CM | POA: Insufficient documentation

## 2012-12-01 DIAGNOSIS — R220 Localized swelling, mass and lump, head: Secondary | ICD-10-CM | POA: Insufficient documentation

## 2012-12-01 DIAGNOSIS — K219 Gastro-esophageal reflux disease without esophagitis: Secondary | ICD-10-CM | POA: Insufficient documentation

## 2012-12-01 DIAGNOSIS — Z87891 Personal history of nicotine dependence: Secondary | ICD-10-CM | POA: Insufficient documentation

## 2012-12-01 DIAGNOSIS — Z79899 Other long term (current) drug therapy: Secondary | ICD-10-CM | POA: Insufficient documentation

## 2012-12-01 DIAGNOSIS — E039 Hypothyroidism, unspecified: Secondary | ICD-10-CM | POA: Insufficient documentation

## 2012-12-01 DIAGNOSIS — H538 Other visual disturbances: Secondary | ICD-10-CM | POA: Insufficient documentation

## 2012-12-01 DIAGNOSIS — Z8719 Personal history of other diseases of the digestive system: Secondary | ICD-10-CM | POA: Insufficient documentation

## 2012-12-01 DIAGNOSIS — F411 Generalized anxiety disorder: Secondary | ICD-10-CM | POA: Insufficient documentation

## 2012-12-01 MED ORDER — IBUPROFEN 400 MG PO TABS
600.0000 mg | ORAL_TABLET | Freq: Once | ORAL | Status: AC
  Administered 2012-12-01: 600 mg via ORAL
  Filled 2012-12-01: qty 1

## 2012-12-01 MED ORDER — HYDROCODONE-ACETAMINOPHEN 5-325 MG PO TABS
1.0000 | ORAL_TABLET | Freq: Once | ORAL | Status: AC
  Administered 2012-12-01: 1 via ORAL
  Filled 2012-12-01: qty 1

## 2012-12-01 MED ORDER — IOHEXOL 300 MG/ML  SOLN
80.0000 mL | Freq: Once | INTRAMUSCULAR | Status: AC | PRN
  Administered 2012-12-01: 80 mL via INTRAVENOUS

## 2012-12-01 NOTE — ED Notes (Signed)
Report taken from Audrey, RN.  

## 2012-12-01 NOTE — ED Notes (Signed)
CT called to inquiry about how soon transport would come and get pt for scan; was informed that "there are a few people in front of her"; RN notified

## 2012-12-01 NOTE — ED Notes (Addendum)
Sign language interpreter at bedside for pt; pt alert and mentating appropriately

## 2012-12-01 NOTE — ED Notes (Addendum)
Pt alert and mentating appropriately upon d/c; pt given d/c teaching and follow up care instructions; pt verbalizes understanding of d/c teaching and follow up care; pt denies need for further questions; pt ambulatory leaving ED with d/c paperwork; pt leaving with husband; pt instructed not to drive, pts husband endorses he will taking pt home.

## 2012-12-01 NOTE — ED Notes (Signed)
Deaf interpreter in.

## 2012-12-01 NOTE — ED Notes (Signed)
Called [place] in [place] and informed that pt ready for transport to [place]

## 2012-12-01 NOTE — ED Provider Notes (Addendum)
History  This chart was scribed for Suzi Roots, MD by Bennett Scrape, ED Scribe. This patient was seen in room TR06C/TR06C and the patient's care was started at 4:56 PM.  CSN: [id number]  Arrival date & time 12/01/12  1632   First MD Initiated Contact with Patient 12/01/12 1656      Chief Complaint  Patient presents with  . Facial Swelling     The history is provided by the patient. A language interpreter was used Oncologist ).    Donna Davis is a 42 y.o. female who presents to the Emergency Department complaining of 2 days of right cheek and periorbital pain described as pressure and burning with associated mild swelling. States at times vision seems diffusely blurry, but states vision currently at baseline - states needs contacts/glasses but doesn't have with her.  She denies any injury or fall. She states that she originally thought that the symptoms were from crying or from being fitted for contacts and glasses last week but she became more concerned when the pain increased today. The eye was checked by [facility] today but she was told to go to the ED. She reports taking Advil and Tylenol with mild improvement until today. She denies fever and discharge as associated symptoms. No purulent drainage from eye. No fb sensation. No headache. No neck pain or stiffness. No erythema. No fever or chills.      Past Medical History  Diagnosis Date  . GERD (gastroesophageal reflux disease)   . Hypothyroidism   . IBS (irritable bowel syndrome)   . Anxiety   . Hearing difficulty     b/l, 2/2 congenital rubella s/p stapedectomy. Using hearing aid  . Headache 04/2005    h/o headache in past that raised question of psuedotumor s/p therapeutic LP with an opening preassure of 18 cm H2O   . Hepatic cyst     6 mm on CT done done in 05/2005  . Family history of malignant neoplasm of gastrointestinal tract     Past Surgical History  Procedure Laterality Date  . Abdominal  hysterectomy      partial hysterecotmy 2003 for endoemtriosis with residula right ovaey. Multiple GYN surgeries for chronic pelvic pain  . Cesarean section  1994  . Stapedectomy  2007    Dr. Ezzard Standing, left ear  . Laparoscopic ovarian cystectomy  2001  . Thyroid nodule removed  1997  . Total abdominal hysterectomy w/ bilateral salpingoophorectomy      left  . Abdominal adhesion surgery      removal from bowel    Family History  Problem Relation Age of Onset  . Breast cancer Mother   . Ovarian cancer Mother   . Diabetes Mother   . Stomach cancer Maternal Grandmother   . Colon cancer Maternal Grandmother   . Diabetes Maternal Grandmother   . Pancreatic cancer Father   . Prostate cancer Father   . Colon cancer Father   . Diabetes Father   . Heart disease Father   . Irritable bowel syndrome Father   . Kidney disease Father   . Heart disease      both grandmothers  . Bipolar disorder Daughter     History  Substance Use Topics  . Smoking status: Former Smoker    Types: Cigarettes  . Smokeless tobacco: Not on file     Comment: quit 68yrs ago  . Alcohol Use: No    OB History   Grav Para Term Preterm Abortions TAB  SAB Ect Mult Living   3 2   1 1    2       Review of Systems  Constitutional: Negative for fever and chills.  HENT: Negative for ear pain, congestion, sore throat and rhinorrhea.   Eyes: Positive for pain. Negative for photophobia, discharge and redness.  Respiratory: Negative for cough and shortness of breath.   Cardiovascular: Negative for chest pain.  Gastrointestinal: Negative for nausea and vomiting.  Genitourinary: Negative for flank pain.  Musculoskeletal: Negative for back pain.  Skin: Negative for rash.  Neurological: Negative for headaches.  Psychiatric/Behavioral: Negative for confusion.    Allergies  Onion; Shellfish allergy; Amoxicillin-pot clavulanate; Cefuroxime; Cephalexin; Ciprofloxacin; Clarithromycin; Clarithromycin; Doxycycline; Other;  Prednisone; Shrimp flavor; Sulfonamide derivatives; and Tramadol hcl  Home Medications   Current Outpatient Rx  Name  Route  Sig  Dispense  Refill  . ALPRAZolam (XANAX) 0.25 MG tablet   Oral   Take 1 tablet (0.25 mg total) by mouth at bedtime as needed for anxiety.   30 tablet   1   . Ascorbic Acid (VITAMIN C) 100 MG tablet   Oral   Take 100 mg by mouth daily.         . cholecalciferol (VITAMIN D) 1000 UNITS tablet   Oral   Take 1,000 Units by mouth daily.         Marland Kitchen FOLIC ACID PO   Oral   Take 1 tablet by mouth daily.         Marland Kitchen levothyroxine (SYNTHROID, LEVOTHROID) 25 MCG tablet   Oral   Take 1 tablet (25 mcg total) by mouth daily.   31 tablet   11   . Norethindrone-Ethinyl Estradiol-Fe Biphas (LO LOESTRIN FE) 1 MG-10 MCG / 10 MCG tablet   Oral   Take 1 tablet by mouth daily.   3 Package   4     Rx faxed to [facility] per pt requ ...   . Omega-3 Fatty Acids (FISH OIL PO)   Oral   Take 2 capsules by mouth daily.         Marland Kitchen oxyCODONE-acetaminophen (PERCOCET/ROXICET) 5-325 MG per tablet   Oral   Take 1-2 tablets by mouth every 6 (six) hours as needed for pain.   15 tablet   0   . oxyCODONE-acetaminophen (PERCOCET/ROXICET) 5-325 MG per tablet   Oral   Take 2 tablets by mouth every 4 (four) hours as needed for pain.   15 tablet   0   . vitamin B-12 (CYANOCOBALAMIN) 100 MCG tablet   Oral   Take 50 mcg by mouth daily.           Triage Vitals: BP 130/84  Pulse 95  Temp(Src) 99.1 F (37.3 C) (Oral)  Resp 18  SpO2 97%  LMP 11/28/2003  Physical Exam  Nursing note and vitals reviewed. Constitutional: She appears well-developed and well-nourished. No distress.  HENT:  Head: Normocephalic and atraumatic.  Nose: Nose normal.  Mouth/Throat: Oropharynx is clear and moist.  ?mild right periorbital swellin/+ tenderness with minimal associated ecchymosis to superior aspect right cheek.   Eyes: Conjunctivae and EOM are normal. Pupils are  equal, round, and reactive to light. Right eye exhibits no discharge. Left eye exhibits no discharge.  Conj not injected. No pain w eom. Pupils 3-4 mm, briskly reactive. No corneal clouding. No proptosis. Funduscopic exam unremarkable.   Neck: Normal range of motion. Neck supple. No tracheal deviation present.  No stiffness or rigidity  Cardiovascular: Normal rate, regular rhythm, normal heart sounds and intact distal pulses.   Pulmonary/Chest: Effort normal and breath sounds normal. No respiratory distress.  Musculoskeletal: Normal range of motion. She exhibits no edema and no tenderness.  Lymphadenopathy:    She has no cervical adenopathy.  Neurological: She is alert.  Alert, speech clear. Communicates via reading lips and sign interpreter. Motor intact bil. Steady gait.   Skin: Skin is warm and dry. No rash noted.  Psychiatric: She has a normal mood and affect. Her behavior is normal.    ED Course  Procedures (including critical care time)  DIAGNOSTIC STUDIES: Oxygen Saturation is 97% on room air, adequate by my interpretation.    COORDINATION OF CARE: 5:19 PM-Discussed treatment plan which includes CT scan of maxillofacial with pt at bedside and pt agreed to plan.   Ct Orbits W/cm  12/01/2012  *RADIOLOGY REPORT*  Clinical Data: Pressure behind right eye.  CT ORBITS WITH CONTRAST  Technique:  Multidetector CT imaging of the orbits was performed following the bolus administration of intravenous contrast.  Contrast: 80mL OMNIPAQUE IOHEXOL 300 MG/ML  SOLN  Comparison: 04/19/2008 MR  Findings: No preseptal or postseptal inflammatory process. Symmetric appearance of the extraocular muscles.  Globes appear to be grossly intact. Symmetric appearance of the superior ophthalmic veins and cavernous sinus region.  Partially empty sella.  The patient has been worked up for pseudotumor cerebri per notes.  Visualized paranasal sinuses, mastoid air cells and middle ear cavities are clear.  IMPRESSION:  No preseptal or postseptal inflammatory process.  Visualized paranasal sinuses, mastoid air cells and middle ear cavities are clear.   Original Report Authenticated By: Lacy Duverney, M.D.       MDM  I personally performed the services described in this documentation, which was scribed in my presence. The recorded information has been reviewed and is accurate.  Motrin, vicodin for pain  (no meds pta, has ride/does not have to drive)  Ct.  Pt updated on delay for ct, multiple cts pending, single tech.  Ct neg acute.  Given faint/mild bruising noted to lower periorbital, ?unrecalled minor trauma.  No fever. No orbital/periorb cellulitis.   Pt stable for d/c. Will refer to close pcp f/u.     Suzi Roots, MD 12/01/12 1610  Suzi Roots, MD 12/01/12 2200

## 2012-12-01 NOTE — ED Notes (Signed)
Pt is here with right eye distal swelling and blurry vision for 2 days.  No injury .  Pt is deaf and interpreter called

## 2012-12-01 NOTE — ED Notes (Signed)
Started 2 days ago with "pressure" around right eye. Today has redness and swelling under right eye.

## 2012-12-02 ENCOUNTER — Ambulatory Visit (INDEPENDENT_AMBULATORY_CARE_PROVIDER_SITE_OTHER): Payer: BC Managed Care – PPO | Admitting: Internal Medicine

## 2012-12-02 ENCOUNTER — Other Ambulatory Visit: Payer: Self-pay | Admitting: Internal Medicine

## 2012-12-02 ENCOUNTER — Encounter: Payer: Self-pay | Admitting: Radiation Oncology

## 2012-12-02 VITALS — BP 144/86 | HR 84 | Temp 99.0°F | Ht 61.0 in | Wt 191.2 lb

## 2012-12-02 DIAGNOSIS — G43909 Migraine, unspecified, not intractable, without status migrainosus: Secondary | ICD-10-CM | POA: Insufficient documentation

## 2012-12-02 HISTORY — DX: Migraine, unspecified, not intractable, without status migrainosus: G43.909

## 2012-12-02 MED ORDER — KETOROLAC TROMETHAMINE 60 MG/2ML IM SOLN
60.0000 mg | Freq: Once | INTRAMUSCULAR | Status: AC
  Administered 2012-12-02: 60 mg via INTRAMUSCULAR

## 2012-12-02 MED ORDER — SUMATRIPTAN SUCCINATE 100 MG PO TABS: ORAL_TABLET | ORAL | Status: DC

## 2012-12-02 NOTE — Assessment & Plan Note (Signed)
Her presentation today is consistent with migraine.  CT scan done in the ED of the orbits showed no sinus disease or other medical disease of the orbits or surrounding tissues.  She has no signs and is too young for temporal arteritis.  This coupled with the history of migraine makes this likely the cause of her headache and pain.  We will give her a Toradol 60 mg shot today in the clinic and send her with a prescription for Sumatriptan 100 mg tablets.  We will start with 1/2 tablet for initial therapy but she can easily go up to 1 full tablet if needed.

## 2012-12-02 NOTE — Progress Notes (Signed)
Subjective:   Patient ID: Donna Davis female   DOB: 05/16/1971 42 y.o.   MRN: [id number]  HPI: [name] is a 42 y.o. woman who presents to clinic today complaining of right sided eye pain and headache for the last 3 days.  She states that Friday she noted that she was starting to get pressure behind the right eye.  She had gone to [facility] to get her vision checked and was told that she needed glasses or contacts.  She worked with the staff there and states that she went through 15 different sets of trial lenses to try to get the right one.  She initially thought the pressure was secondary to that.  Over the weekend it continued to get worse and she noted some mild swelling around the eye.  She also states that she had been crying a lot over the last few days because her daughter just left for basic training.  This continued to bother her so she presented to the ED yesterday.  In the ED they did a CT of the orbits and noted no signs of cellulitis, change in the ocular muscles, or sinus disease.  She just returned from a visit with [facility] where she states they dilated her eye and tested for glaucoma or other disease of the eye and she was told it was negative.  Today she states that the pain and pressure is behind the eye and moves to the back of her head on the right side and is of a throbbing nature.  She has tried cold compresses as well as tylenol and ibuprofen which did help mildly but not much and the ibuprofen has been causing upset stomach.  She endorses photophobia but denies fevers, chills, drainage from the eye, nasal drainage, itching in the eye, fullness in the ears, sore throat, sick contacts, or cough.  She has a history of migraine headaches, almost daily prior to her daughters birth 44 years ago, and since then has only had 3-4 migraines that she can identify.  She does not remember the medication that she was on at that time.    Past Medical History  Diagnosis Date   . GERD (gastroesophageal reflux disease)   . Hypothyroidism   . IBS (irritable bowel syndrome)   . Anxiety   . Hearing difficulty     b/l, 2/2 congenital rubella s/p stapedectomy. Using hearing aid  . Headache 04/2005    h/o headache in past that raised question of psuedotumor s/p therapeutic LP with an opening preassure of 18 cm H2O   . Hepatic cyst     6 mm on CT done done in 05/2005  . Family history of malignant neoplasm of gastrointestinal tract    Current Outpatient Prescriptions  Medication Sig Dispense Refill  . Ascorbic Acid (VITAMIN C) 100 MG tablet Take 100 mg by mouth daily.      . cholecalciferol (VITAMIN D) 1000 UNITS tablet Take 1,000 Units by mouth daily.      Marland Kitchen FOLIC ACID PO Take 1 tablet by mouth daily.      Marland Kitchen levothyroxine (SYNTHROID, LEVOTHROID) 25 MCG tablet Take 1 tablet (25 mcg total) by mouth daily.  31 tablet  11  . Norethindrone-Ethinyl Estradiol-Fe Biphas (LO LOESTRIN FE) 1 MG-10 MCG / 10 MCG tablet Take 1 tablet by mouth daily.  3 Package  4  . Omega-3 Fatty Acids (FISH OIL PO) Take 2 capsules by mouth daily.      . vitamin  B-12 (CYANOCOBALAMIN) 100 MCG tablet Take 50 mcg by mouth daily.       No current facility-administered medications for this visit.   Family History  Problem Relation Age of Onset  . Breast cancer Mother   . Ovarian cancer Mother   . Diabetes Mother   . Stomach cancer Maternal Grandmother   . Colon cancer Maternal Grandmother   . Diabetes Maternal Grandmother   . Pancreatic cancer Father   . Prostate cancer Father   . Colon cancer Father   . Diabetes Father   . Heart disease Father   . Irritable bowel syndrome Father   . Kidney disease Father   . Heart disease      both grandmothers  . Bipolar disorder Daughter    History   Social History  . Marital Status: Married    Spouse Name: N/A    Number of Children: 2  . Years of Education: N/A   Occupational History  . student    Social History Main Topics  . Smoking  status: Former Smoker    Types: Cigarettes  . Smokeless tobacco: None     Comment: quit 86yrs ago  . Alcohol Use: No  . Drug Use: No  . Sexually Active: Yes   Other Topics Concern  . None   Social History Narrative   Current smoker, not using alcohol or drugs at this time   Lives with husband and 2 children   Review of Systems: Constitutional: Denies fever, chills, diaphoresis, appetite change and fatigue.  HEENT: Positive for photophobia.  Denies eye pain, redness, ear pain, congestion, sore throat, rhinorrhea, sneezing, mouth sores, trouble swallowing, neck pain, neck stiffness and tinnitus.   Respiratory: Denies SOB, DOE, cough, chest tightness,  and wheezing.   Cardiovascular: Denies chest pain, palpitations and leg swelling.  Gastrointestinal: Denies nausea, vomiting, abdominal pain, diarrhea, constipation, blood in stool and abdominal distention.  Genitourinary: Denies dysuria, urgency, frequency, hematuria, flank pain and difficulty urinating.  Musculoskeletal: Denies myalgias, back pain, joint swelling, arthralgias and gait problem.  Skin: Denies pallor, rash and wound.  Neurological: Denies dizziness, seizures, syncope, weakness, light-headedness, numbness and headaches.  Hematological: Denies adenopathy. Easy bruising, personal or family bleeding history  Psychiatric/Behavioral: Denies suicidal ideation, mood changes, confusion, nervousness, sleep disturbance and agitation  Objective:  Physical Exam: Filed Vitals:   12/02/12 1603  BP: 144/86  Pulse: 84  Temp: 99 F (37.2 C)  TempSrc: Oral  Height: 5\' 1"  (1.549 m)  Weight: 191 lb 3.2 oz (86.728 kg)  SpO2: 99%   Constitutional: Vital signs reviewed.  Patient is a well-developed and well-nourished woman in mild acute distress from pain and cooperative with exam. Alert and oriented x3.  Head: Normocephalic and atraumatic Ear: TM normal bilaterally Mouth: no erythema or exudates, MMM Eyes: pupils enlarged at 8 mm  symmetric PERRL, EOMI, conjunctivae normal, No scleral icterus.  mild pain with all eye movements on the right.  No visual field loss.   Neck: Supple, Trachea midline normal ROM, No JVD, mass, thyromegaly, or carotid bruit present.  Cardiovascular: RRR, S1 normal, S2 normal, no MRG, pulses symmetric and intact bilaterally Pulmonary/Chest: CTAB, no wheezes, rales, or rhonchi Abdominal: Soft. Non-tender, non-distended, bowel sounds are normal, no masses, organomegaly, or guarding present.  GU: no CVA tenderness Musculoskeletal: No joint deformities, erythema, or stiffness, ROM full and no nontender Hematology: no cervical, inginal, or axillary adenopathy.  Neurological: A&O x3, Strength is normal and symmetric bilaterally, cranial nerve II-XII are grossly intact, no  focal motor deficit, sensory intact to light touch bilaterally.  Skin: Warm, dry and intact. No rash, cyanosis, or clubbing.  Psychiatric: anxious mood and affect. speech and behavior is normal. Judgment and thought content normal. Cognition and memory are normal.   Assessment & Plan:

## 2012-12-02 NOTE — Patient Instructions (Signed)
1.  The new medication is Sumatriptan 100 mg tablets.  At the onset of the headache, take 1/2 tablet.  If the headache does not get better within 30-60 minutes you can take the other half.  2.  Follow up with your PCP in about 4 weeks to see how you are doing.  Migraine Headache A migraine headache is an intense, throbbing pain on one or both sides of your head. A migraine can last for 30 minutes to several hours. CAUSES  The exact cause of a migraine headache is not always known. However, a migraine may be caused when nerves in the brain become irritated and release chemicals that cause inflammation. This causes pain. SYMPTOMS  Pain on one or both sides of your head.  Pulsating or throbbing pain.  Severe pain that prevents daily activities.  Pain that is aggravated by any physical activity.  Nausea, vomiting, or both.  Dizziness.  Pain with exposure to bright lights, loud noises, or activity.  General sensitivity to bright lights, loud noises, or smells. Before you get a migraine, you may get warning signs that a migraine is coming (aura). An aura may include:  Seeing flashing lights.  Seeing bright spots, halos, or zig-zag lines.  Having tunnel vision or blurred vision.  Having feelings of numbness or tingling.  Having trouble talking.  Having muscle weakness. MIGRAINE TRIGGERS  Alcohol.  Smoking.  Stress.  Menstruation.  Aged cheeses.  Foods or drinks that contain nitrates, glutamate, aspartame, or tyramine.  Lack of sleep.  Chocolate.  Caffeine.  Hunger.  Physical exertion.  Fatigue.  Medicines used to treat chest pain (nitroglycerine), birth control pills, estrogen, and some blood pressure medicines. DIAGNOSIS  A migraine headache is often diagnosed based on:  Symptoms.  Physical examination.  A CT scan or MRI of your head. TREATMENT Medicines may be given for pain and nausea. Medicines can also be given to help prevent recurrent  migraines.  HOME CARE INSTRUCTIONS  Only take over-the-counter or prescription medicines for pain or discomfort as directed by your caregiver. The use of long-term narcotics is not recommended.  Lie down in a dark, quiet room when you have a migraine.  Keep a journal to find out what may trigger your migraine headaches. For example, write down:  What you eat and drink.  How much sleep you get.  Any change to your diet or medicines.  Limit alcohol consumption.  Quit smoking if you smoke.  Get 7 to 9 hours of sleep, or as recommended by your caregiver.  Limit stress.  Keep lights dim if bright lights bother you and make your migraines worse. SEEK IMMEDIATE MEDICAL CARE IF:   Your migraine becomes severe.  You have a fever.  You have a stiff neck.  You have vision loss.  You have muscular weakness or loss of muscle control.  You start losing your balance or have trouble walking.  You feel faint or pass out.  You have severe symptoms that are different from your first symptoms. MAKE SURE YOU:   Understand these instructions.  Will watch your condition.  Will get help right away if you are not doing well or get worse. Document Released: 09/03/2005 Document Revised: 11/26/2011 Document Reviewed: 08/24/2011 [facility] Patient Information 2013 [place], [place].

## 2012-12-08 ENCOUNTER — Other Ambulatory Visit: Payer: Self-pay | Admitting: Internal Medicine

## 2012-12-08 ENCOUNTER — Other Ambulatory Visit: Payer: Self-pay | Admitting: *Deleted

## 2012-12-08 DIAGNOSIS — G43909 Migraine, unspecified, not intractable, without status migrainosus: Secondary | ICD-10-CM

## 2012-12-09 NOTE — Telephone Encounter (Signed)
She was given 9 tablets only 6 days prior to the refill request which means she is likely not taking the medicine correctly or safely.  If she is having headaches daily and the medicine helps she needs a medication to help prevent the headaches instead of treating them when she has them.

## 2012-12-10 ENCOUNTER — Ambulatory Visit: Payer: BC Managed Care – PPO | Admitting: Internal Medicine

## 2012-12-11 ENCOUNTER — Ambulatory Visit (INDEPENDENT_AMBULATORY_CARE_PROVIDER_SITE_OTHER): Payer: BC Managed Care – PPO | Admitting: Internal Medicine

## 2012-12-11 ENCOUNTER — Encounter: Payer: Self-pay | Admitting: Internal Medicine

## 2012-12-11 VITALS — BP 136/91 | HR 82 | Temp 97.3°F | Ht 61.0 in | Wt 192.0 lb

## 2012-12-11 DIAGNOSIS — G43909 Migraine, unspecified, not intractable, without status migrainosus: Secondary | ICD-10-CM

## 2012-12-11 MED ORDER — SUMATRIPTAN SUCCINATE 100 MG PO TABS: ORAL_TABLET | ORAL | Status: DC

## 2012-12-11 MED ORDER — PROPRANOLOL HCL ER 80 MG PO CP24: 80.0000 mg | ORAL_CAPSULE | Freq: Every day | ORAL | Status: DC

## 2012-12-11 NOTE — Patient Instructions (Addendum)
General Instructions: Follow up in 1 month  Review the information below    Treatment Goals:  Goals (1 Years of Data) as of 12/11/12   None      Progress Toward Treatment Goals:    Self Care Goals & Plans:  Self Care Goal 12/11/2012  Manage my medications take my medicines as prescribed; bring my medications to every visit       Care Management & Community Referrals:  Referral 12/11/2012  Referrals made to community resources (No Data)       Propranolol tablets What is this medicine? PROPRANOLOL (proe PRAN oh lole) is a beta-blocker. Beta-blockers reduce the workload on the heart and help it to beat more regularly. This medicine is used to treat high blood pressure, to control irregular heart rhythms (arrhythmias) and to relieve chest pain caused by angina. It may also be helpful after a heart attack. This medicine is also used to prevent migraine headaches, relieve uncontrollable shaking (tremors), and help certain problems related to the thyroid gland and adrenal gland. This medicine may be used for other purposes; ask your health care provider or pharmacist if you have questions. What should I tell my health care provider before I take this medicine? They need to know if you have any of these conditions: -circulation problems or blood vessel disease -diabetes -history of heart attack or heart disease, vasospastic angina -kidney disease -liver disease -lung or breathing disease, like asthma or emphysema -pheochromocytoma -slow heart rate -thyroid disease -an unusual or allergic reaction to propranolol, other beta-blockers, medicines, foods, dyes, or preservatives -pregnant or trying to get pregnant -breast-feeding How should I use this medicine? Take this medicine by mouth with a glass of water. Follow the directions on the prescription label. Take your doses at regular intervals. Do not take your medicine more often than directed. Do not stop taking except on your  the advice of your doctor or health care professional. Talk to your pediatrician regarding the use of this medicine in children. Special care may be needed. Overdosage: If you think you have taken too much of this medicine contact a poison control center or emergency room at once. NOTE: This medicine is only for you. Do not share this medicine with others. What if I miss a dose? If you miss a dose, take it as soon as you can. If it is almost time for your next dose, take only that dose. Do not take double or extra doses. What may interact with this medicine? Do not take this medicine with any of the following medications: -feverfew -phenothiazines like chlorpromazine, mesoridazine, prochlorperazine, thioridazine -sotalol This medicine may also interact with the following medications: -aluminum hydroxide gel -antipyrine -barbiturates like phenobarbital -cimetidine -ciprofloxacin -diazepam -fluconazole -haloperidol -isoniazid -medicines for cholesterol like cholestyramine or colestipol -medicines to control heart rhythm -medicines for high blood pressure -medicines for HIV -medicines for mental depression -medicines for migraine headache like almotriptan, eletriptan, frovatriptan, naratriptan, rizatriptan, sumatriptan, zolmitriptan -NSAIDs, medicines for pain and inflammation, like ibuprofen or naproxen -phenytoin -rifampin -teniposide -theophylline -thyroid medicines -tolbutamide -warfarin -zileuton This list may not describe all possible interactions. Give your health care provider a list of all the medicines, herbs, non-prescription drugs, or dietary supplements you use. Also tell them if you smoke, drink alcohol, or use illegal drugs. Some items may interact with your medicine. What should I watch for while using this medicine? Visit your doctor or health care professional for regular check ups. Check your blood pressure and pulse rate regularly. Ask  your health care  professional what your blood pressure and pulse rate should be, and when you should contact them. You may get drowsy or dizzy. Do not drive, use machinery, or do anything that needs mental alertness until you know how this drug affects you. Do not stand or sit up quickly, especially if you are an older patient. This reduces the risk of dizzy or fainting spells. Alcohol can make you more drowsy and dizzy. Avoid alcoholic drinks. This medicine can affect blood sugar levels. If you have diabetes, check with your doctor or health care professional before you change your diet or the dose of your diabetic medicine. Do not treat yourself for coughs, colds, or pain while you are taking this medicine without asking your doctor or health care professional for advice. Some ingredients may increase your blood pressure. What side effects may I notice from receiving this medicine? Side effects that you should report to your doctor or health care professional as soon as possible: -allergic reactions like skin rash, itching or hives, swelling of the face, lips, or tongue -breathing problems -changes in blood sugar -cold hands or feet -difficulty sleeping, nightmares -dry peeling skin -hallucinations -muscle cramps or weakness -slow heart rate -swelling of the legs and ankles -vomiting Side effects that usually do not require medical attention (report to your doctor or health care professional if they continue or are bothersome): -change in sex drive or performance -diarrhea -dry sore eyes -hair loss -nausea -weak or tired This list may not describe all possible side effects. Call your doctor for medical advice about side effects. You may report side effects to FDA at 1-800-FDA-1088. Where should I keep my medicine? Keep out of the reach of children. Store at room temperature between 15 and 30 degrees C (59 and 86 degrees F). Protect from light. Throw away any unused medicine after the expiration  date. NOTE: This sheet is a summary. It may not cover all possible information. If you have questions about this medicine, talk to your doctor, pharmacist, or health care provider.  2012, [employer]. (04/05/2008 3:03:47 PM) Sumatriptan tablets What is this medicine? SUMATRIPTAN (soo ma TRIP tan) is used to treat migraines with or without aura. An aura is a strange feeling or visual disturbance that warns you of an attack. It is not used to prevent migraines. This medicine may be used for other purposes; ask your health care provider or pharmacist if you have questions. What should I tell my health care provider before I take this medicine? They need to know if you have any of these conditions: -bowel disease or colitis -diabetes -family history of heart disease -fast or irregular heart beat -heart or blood vessel disease, angina (chest pain), or previous heart attack -high blood pressure -high cholesterol -history of stroke, transient ischemic attacks (TIAs or mini-strokes), or intracranial bleeding -kidney or liver disease -overweight -poor circulation -postmenopausal or surgical removal of uterus and ovaries -Raynaud's disease -seizure disorder -an unusual or allergic reaction to sumatriptan, other medicines, foods, dyes, or preservatives -pregnant or trying to get pregnant -breast-feeding How should I use this medicine? Take this medicine by mouth with a glass of water. Follow the directions on the prescription label. This medicine is taken at the first symptoms of a migraine. It is not for everyday use. If your migraine headache returns after one dose, you can take another dose as directed. You must leave at least 2 hours between doses, and do not take more than 100 mg as  a single dose. Do not take more than 200 mg total in any 24 hour period. If there is no improvement at all after the first dose, do not take a second dose without talking to your doctor or health care  professional. Do not take your medicine more often than directed. Talk to your pediatrician regarding the use of this medicine in children. Special care may be needed. Overdosage: If you think you have taken too much of this medicine contact a poison control center or emergency room at once. NOTE: This medicine is only for you. Do not share this medicine with others. What if I miss a dose? This does not apply; this medicine is not for regular use. What may interact with this medicine? Do not take this medicine with any of the following medicines: -amphetamine or cocaine -dihydroergotamine, ergotamine, ergoloid mesylates, methysergide, or ergot-type medication - do not take within 24 hours of taking sumatriptan -feverfew -MAOIs like Carbex, Eldepryl, Marplan, Nardil, and Parnate - do not take sumatriptan within 2 weeks of stopping MAOI therapy -other migraine medicines like almotriptan, eletriptan, naratriptan, rizatriptan, zolmitriptan - do not take within 24 hours of taking sumatriptan -tryptophan This medicine may also interact with the following medications: -lithium -medicines for mental depression, anxiety or mood problems -medicines for weight loss such as dexfenfluramine, dextroamphetamine, fenfluramine, or sibutramine -St. John's wort This list may not describe all possible interactions. Give your health care provider a list of all the medicines, herbs, non-prescription drugs, or dietary supplements you use. Also tell them if you smoke, drink alcohol, or use illegal drugs. Some items may interact with your medicine. What should I watch for while using this medicine? Only take this medicine for a migraine headache. Take it if you get warning symptoms or at the start of a migraine attack. It is not for regular use to prevent migraine attacks. You may get drowsy or dizzy. Do not drive, use machinery, or do anything that needs mental alertness until you know how this medicine affects you.  To reduce dizzy or fainting spells, do not sit or stand up quickly, especially if you are an older patient. Alcohol can increase drowsiness, dizziness and flushing. Avoid alcoholic drinks. Smoking cigarettes may increase the risk of heart-related side effects from using this medicine. What side effects may I notice from receiving this medicine? Side effects that you should report to your doctor or health care professional as soon as possible: -allergic reactions like skin rash, itching or hives, swelling of the face, lips, or tongue -breathing problems -changes in vision -chest or throat pain, tightness -fast, slow, or irregular heart beat -hallucinations -increased or decreased blood pressure -problems with balance, talking, walking -seizures -severe stomach pain and cramping, bloody diarrhea -tingling, pain, or numbness in the face, hands or feet Side effects that usually do not require medical attention (report to your doctor or health care professional if they continue or are bothersome): -drowsiness -feeling warm, flushing, or redness of the face -muscle pain or cramps -nausea, vomiting, diarrhea or stomach upset -weak or tired This list may not describe all possible side effects. Call your doctor for medical advice about side effects. You may report side effects to FDA at 1-800-FDA-1088. Where should I keep my medicine? Keep out of the reach of children. Store at room temperature between 2 and 30 degrees C (36 and 86 degrees F). Throw away any unused medicine after the expiration date. NOTE: This sheet is a summary. It may not cover all possible  information. If you have questions about this medicine, talk to your doctor, pharmacist, or health care provider.  2012, [employer] Standard. (11/10/2007 6:27:14 PM) Headaches, Frequently Asked Questions MIGRAINE HEADACHES Q: What is migraine? What causes it? How can I treat it? A: Generally, migraine headaches begin as a dull ache.  Then they develop into a constant, throbbing, and pulsating pain. You may experience pain at the temples. You may experience pain at the front or back of one or both sides of the head. The pain is usually accompanied by a combination of:  Nausea.  Vomiting.  Sensitivity to light and noise. Some people (about 15%) experience an aura (see below) before an attack. The cause of migraine is believed to be chemical reactions in the brain. Treatment for migraine may include over-the-counter or prescription medications. It may also include self-help techniques. These include relaxation training and biofeedback.  Q: What is an aura? A: About 15% of people with migraine get an "aura". This is a sign of neurological symptoms that occur before a migraine headache. You may see wavy or jagged lines, dots, or flashing lights. You might experience tunnel vision or blind spots in one or both eyes. The aura can include visual or auditory hallucinations (something imagined). It may include disruptions in smell (such as strange odors), taste or touch. Other symptoms include:  Numbness.  A "pins and needles" sensation.  Difficulty in recalling or speaking the correct word. These neurological events may last as long as 60 minutes. These symptoms will fade as the headache begins. Q: What is a trigger? A: Certain physical or environmental factors can lead to or "trigger" a migraine. These include:  Foods.  Hormonal changes.  Weather.  Stress. It is important to remember that triggers are different for everyone. To help prevent migraine attacks, you need to figure out which triggers affect you. Keep a headache diary. This is a good way to track triggers. The diary will help you talk to your healthcare professional about your condition. Q: Does weather affect migraines? A: Bright sunshine, hot, humid conditions, and drastic changes in barometric pressure may lead to, or "trigger," a migraine attack in some  people. But studies have shown that weather does not act as a trigger for everyone with migraines. Q: What is the link between migraine and hormones? A: Hormones start and regulate many of your body's functions. Hormones keep your body in balance within a constantly changing environment. The levels of hormones in your body are unbalanced at times. Examples are during menstruation, pregnancy, or menopause. That can lead to a migraine attack. In fact, about three quarters of all women with migraine report that their attacks are related to the menstrual cycle.  Q: Is there an increased risk of stroke for migraine sufferers? A: The likelihood of a migraine attack causing a stroke is very remote. That is not to say that migraine sufferers cannot have a stroke associated with their migraines. In persons under age 5, the most common associated factor for stroke is migraine headache. But over the course of a person's normal life span, the occurrence of migraine headache may actually be associated with a reduced risk of dying from cerebrovascular disease due to stroke.  Q: What are acute medications for migraine? A: Acute medications are used to treat the pain of the headache after it has started. Examples over-the-counter medications, NSAIDs, ergots, and triptans.  Q: What are the triptans? A: Triptans are the newest class of abortive medications. They are specifically  targeted to treat migraine. Triptans are vasoconstrictors. They moderate some chemical reactions in the brain. The triptans work on receptors in your brain. Triptans help to restore the balance of a neurotransmitter called serotonin. Fluctuations in levels of serotonin are thought to be a main cause of migraine.  Q: Are over-the-counter medications for migraine effective? A: Over-the-counter, or "OTC," medications may be effective in relieving mild to moderate pain and associated symptoms of migraine. But you should see your caregiver before  beginning any treatment regimen for migraine.  Q: What are preventive medications for migraine? A: Preventive medications for migraine are sometimes referred to as "prophylactic" treatments. They are used to reduce the frequency, severity, and length of migraine attacks. Examples of preventive medications include antiepileptic medications, antidepressants, beta-blockers, calcium channel blockers, and NSAIDs (nonsteroidal anti-inflammatory drugs). Q: Why are anticonvulsants used to treat migraine? A: During the past few years, there has been an increased interest in antiepileptic drugs for the prevention of migraine. They are sometimes referred to as "anticonvulsants". Both epilepsy and migraine may be caused by similar reactions in the brain.  Q: Why are antidepressants used to treat migraine? A: Antidepressants are typically used to treat people with depression. They may reduce migraine frequency by regulating chemical levels, such as serotonin, in the brain.  Q: What alternative therapies are used to treat migraine? A: The term "alternative therapies" is often used to describe treatments considered outside the scope of conventional Western medicine. Examples of alternative therapy include acupuncture, acupressure, and yoga. Another common alternative treatment is herbal therapy. Some herbs are believed to relieve headache pain. Always discuss alternative therapies with your caregiver before proceeding. Some herbal products contain arsenic and other toxins. TENSION HEADACHES Q: What is a tension-type headache? What causes it? How can I treat it? A: Tension-type headaches occur randomly. They are often the result of temporary stress, anxiety, fatigue, or anger. Symptoms include soreness in your temples, a tightening band-like sensation around your head (a "vice-like" ache). Symptoms can also include a pulling feeling, pressure sensations, and contracting head and neck muscles. The headache begins in your  forehead, temples, or the back of your head and neck. Treatment for tension-type headache may include over-the-counter or prescription medications. Treatment may also include self-help techniques such as relaxation training and biofeedback. CLUSTER HEADACHES Q: What is a cluster headache? What causes it? How can I treat it? A: Cluster headache gets its name because the attacks come in groups. The pain arrives with little, if any, warning. It is usually on one side of the head. A tearing or bloodshot eye and a runny nose on the same side of the headache may also accompany the pain. Cluster headaches are believed to be caused by chemical reactions in the brain. They have been described as the most severe and intense of any headache type. Treatment for cluster headache includes prescription medication and oxygen. SINUS HEADACHES Q: What is a sinus headache? What causes it? How can I treat it? A: When a cavity in the bones of the face and skull (a sinus) becomes inflamed, the inflammation will cause localized pain. This condition is usually the result of an allergic reaction, a tumor, or an infection. If your headache is caused by a sinus blockage, such as an infection, you will probably have a fever. An x-ray will confirm a sinus blockage. Your caregiver's treatment might include antibiotics for the infection, as well as antihistamines or decongestants.  REBOUND HEADACHES Q: What is a rebound headache? What causes  it? How can I treat it? A: A pattern of taking acute headache medications too often can lead to a condition known as "rebound headache." A pattern of taking too much headache medication includes taking it more than 2 days per week or in excessive amounts. That means more than the label or a caregiver advises. With rebound headaches, your medications not only stop relieving pain, they actually begin to cause headaches. Doctors treat rebound headache by tapering the medication that is being overused.  Sometimes your caregiver will gradually substitute a different type of treatment or medication. Stopping may be a challenge. Regularly overusing a medication increases the potential for serious side effects. Consult a caregiver if you regularly use headache medications more than 2 days per week or more than the label advises. ADDITIONAL QUESTIONS AND ANSWERS Q: What is biofeedback? A: Biofeedback is a self-help treatment. Biofeedback uses special equipment to monitor your body's involuntary physical responses. Biofeedback monitors:  Breathing.  Pulse.  Heart rate.  Temperature.  Muscle tension.  Brain activity. Biofeedback helps you refine and perfect your relaxation exercises. You learn to control the physical responses that are related to stress. Once the technique has been mastered, you do not need the equipment any more. Q: Are headaches hereditary? A: Four out of five (80%) of people that suffer report a family history of migraine. Scientists are not sure if this is genetic or a family predisposition. Despite the uncertainty, a child has a 50% chance of having migraine if one parent suffers. The child has a 75% chance if both parents suffer.  Q: Can children get headaches? A: By the time they reach high school, most young people have experienced some type of headache. Many safe and effective approaches or medications can prevent a headache from occurring or stop it after it has begun.  Q: What type of doctor should I see to diagnose and treat my headache? A: Start with your primary caregiver. Discuss his or her experience and approach to headaches. Discuss methods of classification, diagnosis, and treatment. Your caregiver may decide to recommend you to a headache specialist, depending upon your symptoms or other physical conditions. Having diabetes, allergies, etc., may require a more comprehensive and inclusive approach to your headache. The National Head[employer] Foundation will provide,  upon request, a list of [facility] physician members in your state. Document Released: 11/24/2003 Document Revised: 11/26/2011 Document Reviewed: 05/03/2008 [facility] Patient Information 2013 [place], [place].  Migraine Headache A migraine headache is an intense, throbbing pain on one or both sides of your head. A migraine can last for 30 minutes to several hours. CAUSES  The exact cause of a migraine headache is not always known. However, a migraine may be caused when nerves in the brain become irritated and release chemicals that cause inflammation. This causes pain. SYMPTOMS  Pain on one or both sides of your head.  Pulsating or throbbing pain.  Severe pain that prevents daily activities.  Pain that is aggravated by any physical activity.  Nausea, vomiting, or both.  Dizziness.  Pain with exposure to bright lights, loud noises, or activity.  General sensitivity to bright lights, loud noises, or smells. Before you get a migraine, you may get warning signs that a migraine is coming (aura). An aura may include:  Seeing flashing lights.  Seeing bright spots, halos, or zig-zag lines.  Having tunnel vision or blurred vision.  Having feelings of numbness or tingling.  Having trouble talking.  Having muscle weakness. MIGRAINE TRIGGERS  Alcohol.  Smoking.  Stress.  Menstruation.  Aged cheeses.  Foods or drinks that contain nitrates, glutamate, aspartame, or tyramine.  Lack of sleep.  Chocolate.  Caffeine.  Hunger.  Physical exertion.  Fatigue.  Medicines used to treat chest pain (nitroglycerine), birth control pills, estrogen, and some blood pressure medicines. DIAGNOSIS  A migraine headache is often diagnosed based on:  Symptoms.  Physical examination.  A CT scan or MRI of your head. TREATMENT Medicines may be given for pain and nausea. Medicines can also be given to help prevent recurrent migraines.  HOME CARE INSTRUCTIONS  Only take over-the-counter or  prescription medicines for pain or discomfort as directed by your caregiver. The use of long-term narcotics is not recommended.  Lie down in a dark, quiet room when you have a migraine.  Keep a journal to find out what may trigger your migraine headaches. For example, write down:  What you eat and drink.  How much sleep you get.  Any change to your diet or medicines.  Limit alcohol consumption.  Quit smoking if you smoke.  Get 7 to 9 hours of sleep, or as recommended by your caregiver.  Limit stress.  Keep lights dim if bright lights bother you and make your migraines worse. SEEK IMMEDIATE MEDICAL CARE IF:   Your migraine becomes severe.  You have a fever.  You have a stiff neck.  You have vision loss.  You have muscular weakness or loss of muscle control.  You start losing your balance or have trouble walking.  You feel faint or pass out.  You have severe symptoms that are different from your first symptoms. MAKE SURE YOU:   Understand these instructions.  Will watch your condition.  Will get help right away if you are not doing well or get worse. Document Released: 09/03/2005 Document Revised: 11/26/2011 Document Reviewed: 08/24/2011 [facility] Patient Information 2013 [place], [place].

## 2012-12-11 NOTE — Progress Notes (Signed)
Subjective:    Patient ID: Donna Davis, female    DOB: August 05, 1971, 42 y.o.   MRN: [id number]  HPI Comments: 42 y.o presents for f/u for migraine h/a on 12/11/12.  She was seen earlier this month in clinic.  She had a CT orbits 12/01/12 which was negative.  She has been having migraines since before her pregnancy with her daughter (who is now 38 y.o) with only 3 total up until this point. Now for the last 3 weeks she has had h/a daily, constant, worsening.  Sensation is throbbing pressure like. 20/10 pain scale.  She denies any trauma or fall.  When h/a first started she states her right eye was black and blue as though she had trauma but she did not.  H/a distribution is right eye (behind her right eye) and on the right side of her head though she feels as though the h/a is starting to impact the left side of her head now and the left side of her head feels weird.  She reports increased stressors in her life (i.e she is a Consulting civil engineer, her daughter left for the [employer], her husband is cheating, her son moved out).  Associated symptoms with h/a include blurry vision, decreased focus (on schoolwork, ability to read), denies lightheadedness or dizziness, she has vomited for 3 days, +decreased sleep, +confusion (with classwork and with sign language), eyes watering, +photophobia.  She denies motor weakness with h/as.  She was given Imitrex at her last visit which helped her h/a but she took all 9 pills in 1 week.  She states with the Imitrex her h/a never completely went away but it helped.   Imitrex also helped with her blurry vision due to h/a's.  Since running out of Imitrex she has been taking Advil alternating with Tylenol daily not exceeding the daily doses of both medications.  These medications do not help with her h/a.  She is unsure of triggers to her h/a.  She has decreased her caffeine intake no longer drinking coffee or soda.  She knows she needs glasses and has seen two eye  doctors recently who state that do not feel comfortable giving her glasses until her h/a are more controlled.       Review of Systems  HENT: Negative for congestion and sinus pressure.   Eyes: Positive for photophobia and visual disturbance.  Gastrointestinal: Positive for vomiting.  Neurological: Positive for headaches. Negative for dizziness, weakness and light-headedness.  Psychiatric/Behavioral: Positive for sleep disturbance and decreased concentration.       Objective:   Physical Exam  Nursing note and vitals reviewed. Constitutional: She is oriented to person, place, and time. Vital signs are normal. She appears well-developed and well-nourished. She is cooperative. No distress.  Patient there with translator who uses sign language due to the patient being deaf  HENT:  Head: Normocephalic and atraumatic.  Mouth/Throat: Oropharynx is clear and moist and mucous membranes are normal. No oropharyngeal exudate.  Eyes: Conjunctivae are normal. Pupils are equal, round, and reactive to light. Right eye exhibits no discharge. Left eye exhibits no discharge. No scleral icterus.    perrl b/l   Cardiovascular: Normal rate and normal heart sounds.   No murmur heard. Pulmonary/Chest: Effort normal. No respiratory distress. She has no wheezes.  Abdominal: There is no tenderness.  Neurological: She is alert and oriented to person, place, and time. She has normal strength. No cranial nerve deficit. Coordination and gait normal.  No deficits  except Decreased sensation face V1 and V2 distributions   Neurologically intact   Skin: Skin is warm, dry and intact. No rash noted. She is not diaphoretic.  Psychiatric: She has a normal mood and affect. Her speech is normal and behavior is normal. Judgment and thought content normal. Cognition and memory are normal.          Assessment & Plan:  F/u as scheduled at least in 1 month

## 2012-12-14 ENCOUNTER — Encounter: Payer: Self-pay | Admitting: Internal Medicine

## 2012-12-14 NOTE — Assessment & Plan Note (Signed)
Will start Propranolol 80 mg qd for prophylaxis. Patient declines to take Amitryptiline RTC 1 month.  Dose of propranolol can be titrated up Rx refill Imitrex but patient cant have this medication filled until 12/2012

## 2012-12-29 ENCOUNTER — Encounter: Payer: BC Managed Care – PPO | Admitting: Internal Medicine

## 2012-12-29 ENCOUNTER — Telehealth: Payer: Self-pay | Admitting: *Deleted

## 2012-12-29 NOTE — Telephone Encounter (Signed)
A user error has taken place: encounter opened in error, closed for administrative reasons.Donna Spittle Cassady4/14/20144:10 PM .

## 2013-01-07 ENCOUNTER — Encounter: Payer: Self-pay | Admitting: Internal Medicine

## 2013-01-07 ENCOUNTER — Ambulatory Visit (INDEPENDENT_AMBULATORY_CARE_PROVIDER_SITE_OTHER): Payer: BC Managed Care – PPO | Admitting: Internal Medicine

## 2013-01-07 VITALS — BP 120/78 | HR 90 | Temp 98.5°F | Ht 62.5 in | Wt 194.1 lb

## 2013-01-07 DIAGNOSIS — G43909 Migraine, unspecified, not intractable, without status migrainosus: Secondary | ICD-10-CM

## 2013-01-07 MED ORDER — TOPIRAMATE 25 MG PO TABS: 25.0000 mg | ORAL_TABLET | Freq: Every day | ORAL | Status: DC

## 2013-01-07 NOTE — Assessment & Plan Note (Signed)
Pertinent Data Reviewed: CT Orbit, Right (12/01/2012) - No preseptal or postseptal inflammatory process. Visualized paranasal sinuses, mastoid air cells and middle ear cavities are clear. Original Report Authenticated By: Lacy Duverney, M.D.  Lab Results  Component Value Date   TSH 2.87 04/21/2012    Assessment: Cause of this patient's daily headaches is unclear, however is concerning that she is having new onset severe headaches with severity up to greater than 10/10 and was associated nausea, blurriness of vision. May be related to her vision changes requiring recent adjustment of her prescription within the past few months  - as this also somewhat correlates with timeframe of when she's had less intense symptoms. During her last visit on 3/27, she was started on propranolol with an attempt for migraine prophylaxis. However, was intolerant to this medication. Unfortunately, although she has had ED evaluation on 3/18, CT head was ordered at that time.  I do think that given the new onset migraines with greater than 10 out of 10 severity, that she does need to have CT scan of the head to evaluate for any types of bleed or mass lesions. Additionally, I think she would benefit from migraine prophylaxis medications. Has been intolerant to propranolol and Elavil in the past. We will attempt Topomax and see if she is able to tolerate this medication (may also help with weight loss, which she desires). The patient was specifically informed to avoid the Imitrex and Topomax until her CT scan is performed, so that we can ensure that there is no evidence of stroke - she expresses verbal understanding of this issue.  Plan:      CT Head   Continue when necessary Tylenol for now.   Will start Topamax after CT scan results, and also will resume Imitrex when necessary after these results are obtained   The patient confirms understanding to hold the Topamax and Imitrex until CT scan results.  The patient was  informed of the red flag symptoms that should prompt her immediate return for reevaluation - such as, but not limited to the following: new focal weakness, slurred speech, difficulty swallowing, vision loss or vision changes, increased severity of her pain. She verbally expresses understanding of the information provided.

## 2013-01-07 NOTE — Progress Notes (Signed)
Patient: Donna Davis   MRN: [id number]  DOB: 20-Dec-1970  PCP: Inez Catalina, MD   Subjective:    CC: Follow-up and Headache   HPI: [name] is a 42 y.o. female with a PMHx as outlined below, who presented to clinic today with sign-language interpretor for the following:  1) Headaches - Patient describes that in 11/2012, she had a 4 week constant migraine, for which she was seen in the ED on 3/18, with CT orbit negative for eye abnormality. CT head was not ordered at that time. She was seen in clinic on 3/18, started to imitrex, and took it daily for 9 days, with some improvement but without resolution of symptoms. On 3/27 sx persistent, therefore, the patient returned to clinic for reevaluation at which time she was started on Propranolol - however, pt states she took one dose and then had significant lethargy and feeling crazy with sx lasting for 72 hours. Therefore, she discontinued the medication after one dose.  Today, the patient indicates that after the 4 week migraine course, she had resolution of migraine symptoms. However, she continues to have daily continuous, diffuse, pressure-like headaches that occur over her entire head (distinct from her migraines which start behind her right eye and involve right side of her face and head). Currently, the pain is rated a 5/10 in severity and can be > 10/10 at its worst.  Aggravating factors: perfume, but specifically NOT aggravated by light, sound, stress, activity (distinct from migraines that do have light sensitivity), alleviating factors: acetaminophen helps some. denies recent trauma, MVA, falls, increased life stressors. Family history: no known family members with significant headaches. Admits to associated nausea, tinnitus (worsening from her baseline) and blurriness of vision (was seen by eye doctor within last 3 months - and was given a new prescription). Denies associated none, abdominal pain, ataxia, photophobia and phonophobia.      Review of Systems: Per HPI.   Current Outpatient Medications: Medication Sig  . ALPRAZolam (XANAX) 0.25 MG tablet Take 0.25 mg by mouth daily as needed.  . Ascorbic Acid (VITAMIN C) 100 MG tablet Take 100 mg by mouth daily.  Marland Kitchen FOLIC ACID PO Take 1 tablet by mouth daily.  Marland Kitchen levothyroxine (SYNTHROID, LEVOTHROID) 25 MCG tablet Take 1 tablet (25 mcg total) by mouth daily.  . Norethindrone-Ethinyl Estradiol-Fe Biphas (LO LOESTRIN FE) 1 MG-10 MCG / 10 MCG tablet Take 1 tablet by mouth daily.  Melene Muller ON 01/08/2013] SUMAtriptan (IMITREX) 100 MG tablet TAKE 1/2 TABLET AT THE ONSET OF HEADACHE. IF NOT IMPROVED AFTER 30 MINUTES TAKE THE SECOND HALF    Allergies  Allergen Reactions  . Onion Anaphylaxis  . Shellfish Allergy Anaphylaxis and Swelling  . Amoxicillin-Pot Clavulanate Nausea And Vomiting  . Cefuroxime Nausea And Vomiting  . Cephalexin Other (See Comments)     gi upset  . Ciprofloxacin Other (See Comments)     Fevers  . Clarithromycin Other (See Comments)     gi upset  . Clarithromycin Nausea And Vomiting    Pt is OK with azithromycin  . Doxycycline Other (See Comments)     gi upset  . Other Other (See Comments)    Steroids Went crazy  . Prednisone Other (See Comments)     delusions  . Propranolol Other (See Comments)    Fatigue and makes her "feel crazy", refuses to take.  . Shrimp Flavor Other (See Comments)    unknown  . Sulfonamide Derivatives Other (See Comments)  vaginal blisters  . Tramadol Hcl Other (See Comments)     "makes me nuts"    Past Medical History  Diagnosis Date  . GERD (gastroesophageal reflux disease)   . Hypothyroidism   . IBS (irritable bowel syndrome)   . Anxiety   . Hearing difficulty     b/l, 2/2 congenital rubella s/p stapedectomy. Using hearing aid  . Headache 04/2005    h/o headache in past that raised question of psuedotumor s/p therapeutic LP with an opening preassure of 18 cm H2O   . Hepatic cyst     6 mm on CT done done in  05/2005  . Family history of malignant neoplasm of gastrointestinal tract      Objective:    Physical Exam: Filed Vitals:   01/07/13 0907  BP: 120/78  Pulse: 90  Temp: 98.5 F (36.9 C)    General Exam:   General: Vital signs reviewed and noted. Well-developed, well-nourished, in no acute distress; alert, appropriate and cooperative throughout examination.  Head: Normocephalic, atraumatic.  Eyes: No signs of anemia or jaundince.  Nose: Mucous membranes moist, not inflammed, nonerythematous.  Throat: Oropharynx nonerythematous, no exudate appreciated.   Neck: No deformities, masses, or tenderness noted. Supple, no JVD.  Lungs:  Normal respiratory effort. Clear to auscultation BL without crackles or wheezes.  Heart: RRR. S1 and S2 normal without gallop, murmur, or rubs.  Abdomen:  BS normoactive. Soft, Nondistended, non-tender.  No masses or organomegaly.  Extremities: No pretibial edema.    Neurologic Exam:   Mental Status: Alert, oriented, thought content appropriate.  Speech fluent without evidence of aphasia. Able to follow 3 step commands without difficulty.  Cranial Nerves:   II: Visual fields grossly intact.  III/IV/VI: Extraocular movements intact.  Pupils reactive bilaterally.  V/VII: Smile symmetric. facial light touch sensation normal bilaterally.  VIII: Grossly intact.  IX/X: Normal gag.  XI: Bilateral shoulder shrug normal.  XII: Midline tongue extension normal.  Motor:  5/5 bilaterally with normal tone and bulk  Sensory:  Light touch intact throughout, bilaterally    Assessment/ Plan:   The patient's case and plan of care was discussed with attending physician, Dr. Debe Coder.

## 2013-01-07 NOTE — Patient Instructions (Signed)
General Instructions:  Please follow-up at the clinic in 2 weeks, at which time we will reevaluate your headaches and chronic medical issues - OR, please follow-up in the clinic sooner if needed.  There have been changes in your medications:  STOP propranolol  START Topomax for your headaches - only start after I have called you about CT scan results.   Get your CT scan done as soon as you can.  If you have been started on new medication(s), and you develop symptoms concerning for allergic reaction, including, but not limited to, throat closing, tongue swelling, rash, please stop the medication immediately and call the clinic at [phone], and go to the ER.  If you are diabetic, please bring your meter to your next visit.  If symptoms worsen, or new symptoms arise, please call the clinic or go to the ER.  PLEASE BRING ALL OF YOUR MEDICATIONS  IN A BAG TO YOUR NEXT APPOINTMENT   Treatment Goals:  Goals (1 Years of Data) as of 01/07/13   None      Progress Toward Treatment Goals:    Self Care Goals & Plans:  Self Care Goal 12/11/2012  Manage my medications take my medicines as prescribed; bring my medications to every visit       Care Management & Community Referrals:  Referral 12/11/2012  Referrals made to community resources (No Data)        Topiramate tablets What is this medicine? TOPIRAMATE (toe PYRE a mate) is used to treat seizures in adults or children with epilepsy. It is also used for the prevention of migraine headaches. This medicine may be used for other purposes; ask your health care provider or pharmacist if you have questions. What should I tell my health care provider before I take this medicine? They need to know if you have any of these conditions: -cirrhosis of the liver or liver disease -diarrhea -glaucoma -kidney stones or kidney disease -lung disease like asthma, obstructive pulmonary disease, emphysema -metabolic acidosis -on a ketogenic  diet -schedule for surgery or a procedure -suicidal thoughts, plans, or attempt; a previous suicide attempt by you or a family member -an unusual or allergic reaction to topiramate, other medicines, foods, dyes, or preservatives -pregnant or trying to get pregnant -breast-feeding How should I use this medicine? Take this medicine by mouth with a glass of water. Follow the directions on the prescription label. Do not crush or chew. You may take this medicine with meals. Take your medicine at regular intervals. Do not take it more often than directed. Talk to your pediatrician regarding the use of this medicine in children. Special care may be needed. While this drug may be prescribed for children as young as 31 years of age for various seizure disorders, precautions do apply. Overdosage: If you think you have taken too much of this medicine contact a poison control center or emergency room at once. NOTE: This medicine is only for you. Do not share this medicine with others. What if I miss a dose? If you miss a dose, take it as soon as you can. If it is almost time for your next dose, take only that dose. Do not take double or extra doses. What may interact with this medicine? Do not take this medicine with any of the following medications: -probenecid This medicine may also interact with the following medications: -acetazolamide -amitriptyline -dichlorphenamide -digoxin -hydrochlorothiazide -lithium -medicines for pain, sleep, or muscle relaxation -methazolamide -other seizure or epilepsy medicines -pioglitazone -risperidone This  list may not describe all possible interactions. Give your health care provider a list of all the medicines, herbs, non-prescription drugs, or dietary supplements you use. Also tell them if you smoke, drink alcohol, or use illegal drugs. Some items may interact with your medicine. What should I watch for while using this medicine? Visit your doctor or health  care professional for regular checks on your progress. Do not stop taking this medicine suddenly. This increases the risk of seizures if you are using this medicine to control epilepsy. Wear a medical identification bracelet or chain to say you have epilepsy or seizures, and carry a card that lists all your medicines. This medicine can decrease sweating and increase your body temperature. Watch for signs of deceased sweating or fever, especially in children. Avoid extreme heat, hot baths, and saunas. Be careful about exercising, especially in hot weather. Contact your health care provider right away if you notice a fever or decrease in sweating. You should drink plenty of fluids while taking this medicine. If you have had kidney stones in the past, this will help to reduce your chances of forming kidney stones. If you have stomach pain, with nausea or vomiting and yellowing of your eyes or skin, call your doctor immediately. You may get drowsy, dizzy, or have blurred vision. Do not drive, use machinery, or do anything that needs mental alertness until you know how this medicine affects you. To reduce dizziness, do not sit or stand up quickly, especially if you are an older patient. Alcohol can increase drowsiness and dizziness. Avoid alcoholic drinks. If you notice blurred vision, eye pain, or other eye problems, seek medical attention at once for an eye exam. The use of this medicine may increase the chance of suicidal thoughts or actions. Pay special attention to how you are responding while on this medicine. Any worsening of mood, or thoughts of suicide or dying should be reported to your health care professional right away. This medicine may increase the chance of developing metabolic acidosis. If left untreated, this can cause kidney stones, bone disease, or slowed growth in children. Symptoms include breathing fast, fatigue, loss of appetite, irregular heartbeat, or loss of consciousness. Call your  doctor immediately if you experience any of these side effects. Also, tell your doctor about any surgery you plan on having while taking this medicine since this may increase your risk for metabolic acidosis. Birth control pills may not work properly while you are taking this medicine. Talk to your doctor about using an extra method of birth control. Women who become pregnant while using this medicine may enroll in the [place] American Antiepileptic Drug Pregnancy Registry by calling [phone]. This registry collects information about the safety of antiepileptic drug use during pregnancy. What side effects may I notice from receiving this medicine? Side effects that you should report to your doctor or health care professional as soon as possible: -allergic reactions like skin rash, itching or hives, swelling of the face, lips, or tongue -decreased sweating and/or rise in body temperature -depression -difficulty breathing, fast or irregular breathing patterns -difficulty speaking -difficulty walking or controlling muscle movements -hearing impairment -redness, blistering, peeling or loosening of the skin, including inside the mouth -tingling, pain or numbness in the hands or feet -unusually weak or tired -worsening of mood, thoughts or actions of suicide or dying Side effects that usually do not require medical attention (report to your doctor or health care professional if they continue or are bothersome): -altered taste -back pain,  joint or muscle aches and pains -diarrhea, or constipation -headache -loss of appetite -nausea -stomach upset, indigestion -tremors This list may not describe all possible side effects. Call your doctor for medical advice about side effects. You may report side effects to FDA at 1-800-FDA-1088. Where should I keep my medicine? Keep out of the reach of children. Store at room temperature between 15 and 30 degrees C (59 and 86 degrees F) in a tightly closed  container. Protect from moisture. Throw away any unused medicine after the expiration date. NOTE: This sheet is a summary. It may not cover all possible information. If you have questions about this medicine, talk to your doctor, pharmacist, or health care provider.  2012, [employer] Standard. (10/30/2010 7:46:49 PM)

## 2013-01-09 ENCOUNTER — Telehealth: Payer: Self-pay | Admitting: *Deleted

## 2013-01-09 ENCOUNTER — Encounter (HOSPITAL_COMMUNITY): Payer: Self-pay | Admitting: Family Medicine

## 2013-01-09 ENCOUNTER — Emergency Department (HOSPITAL_COMMUNITY): Payer: BC Managed Care – PPO

## 2013-01-09 ENCOUNTER — Emergency Department (HOSPITAL_COMMUNITY)
Admission: EM | Admit: 2013-01-09 | Discharge: 2013-01-10 | Disposition: A | Discharge status: Home / Self Care | Payer: BC Managed Care – PPO | Attending: Emergency Medicine | Admitting: Emergency Medicine

## 2013-01-09 DIAGNOSIS — Z8669 Personal history of other diseases of the nervous system and sense organs: Secondary | ICD-10-CM | POA: Insufficient documentation

## 2013-01-09 DIAGNOSIS — K219 Gastro-esophageal reflux disease without esophagitis: Secondary | ICD-10-CM | POA: Insufficient documentation

## 2013-01-09 DIAGNOSIS — R112 Nausea with vomiting, unspecified: Secondary | ICD-10-CM | POA: Insufficient documentation

## 2013-01-09 DIAGNOSIS — E039 Hypothyroidism, unspecified: Secondary | ICD-10-CM | POA: Insufficient documentation

## 2013-01-09 DIAGNOSIS — Z87891 Personal history of nicotine dependence: Secondary | ICD-10-CM | POA: Insufficient documentation

## 2013-01-09 DIAGNOSIS — Z79899 Other long term (current) drug therapy: Secondary | ICD-10-CM | POA: Insufficient documentation

## 2013-01-09 DIAGNOSIS — F411 Generalized anxiety disorder: Secondary | ICD-10-CM | POA: Insufficient documentation

## 2013-01-09 DIAGNOSIS — Z8719 Personal history of other diseases of the digestive system: Secondary | ICD-10-CM | POA: Insufficient documentation

## 2013-01-09 DIAGNOSIS — Z90710 Acquired absence of both cervix and uterus: Secondary | ICD-10-CM | POA: Insufficient documentation

## 2013-01-09 DIAGNOSIS — R51 Headache: Secondary | ICD-10-CM

## 2013-01-09 LAB — CBC WITH DIFFERENTIAL/PLATELET
Eosinophils Absolute: 0.3 10*3/uL (ref 0.0–0.7)
Lymphocytes Relative: 47 % — ABNORMAL HIGH (ref 12–46)
Lymphs Abs: 4.2 10*3/uL — ABNORMAL HIGH (ref 0.7–4.0)
MCH: 31 pg (ref 26.0–34.0)
Neutro Abs: 3.8 10*3/uL (ref 1.7–7.7)
Neutrophils Relative %: 42 % — ABNORMAL LOW (ref 43–77)
Platelets: 257 10*3/uL (ref 150–400)
RBC: 4.48 MIL/uL (ref 3.87–5.11)
WBC: 8.9 10*3/uL (ref 4.0–10.5)

## 2013-01-09 LAB — BASIC METABOLIC PANEL
GFR calc non Af Amer: >90 mL/min (ref >90)
Glucose, Bld: 94 mg/dL (ref 70–99)
Potassium: 4 mEq/L (ref 3.5–5.1)
Sodium: 140 mEq/L (ref 135–145)

## 2013-01-09 MED ORDER — DIPHENHYDRAMINE HCL 50 MG/ML IJ SOLN
25.0000 mg | Freq: Once | INTRAMUSCULAR | Status: AC
  Administered 2013-01-09: 25 mg via INTRAVENOUS
  Filled 2013-01-09: qty 1

## 2013-01-09 MED ORDER — PROCHLORPERAZINE EDISYLATE 5 MG/ML IJ SOLN
10.0000 mg | Freq: Once | INTRAMUSCULAR | Status: AC
  Administered 2013-01-09: 10 mg via INTRAVENOUS
  Filled 2013-01-09: qty 2

## 2013-01-09 MED ORDER — LORAZEPAM 1 MG PO TABS
1.0000 mg | ORAL_TABLET | Freq: Once | ORAL | Status: DC
  Filled 2013-01-09: qty 1

## 2013-01-09 MED ORDER — SODIUM CHLORIDE 0.9 % IV BOLUS (SEPSIS)
500.0000 mL | Freq: Once | INTRAVENOUS | Status: AC
  Administered 2013-01-09: 500 mL via INTRAVENOUS

## 2013-01-09 MED ORDER — KETOROLAC TROMETHAMINE 30 MG/ML IJ SOLN
30.0000 mg | Freq: Once | INTRAMUSCULAR | Status: AC
  Administered 2013-01-09: 30 mg via INTRAVENOUS
  Filled 2013-01-09 (×2): qty 1

## 2013-01-09 MED ORDER — LORAZEPAM 2 MG/ML IJ SOLN
1.0000 mg | Freq: Once | INTRAMUSCULAR | Status: AC
  Administered 2013-01-09: 1 mg via INTRAVENOUS
  Filled 2013-01-09: qty 1

## 2013-01-09 NOTE — ED Notes (Signed)
Pt to CT via w/c

## 2013-01-09 NOTE — Telephone Encounter (Signed)
  INTERNAL MEDICINE RESIDENCY PROGRAM After-Hours Telephone Call    Reason for call:   I received a call from [name] at 4:45 PM, 01/09/2013 indicating that she is having worsening of her headache symptoms - they are 10/10 diffuse pain over her head at this point. She has attempted OTC tylenol without relief. She     Assessment / Plan / Recommendations:   The patient has had new onset 10/10 migraines over last month. She was seen in the ED, unfortunately CT head was not done. We have attempted to arrange this as an outpatient - however, her insurance requires prior authorization - which are still waiting for. Given that she is having new onset very severe migraines, she needs this study.  I called pt through interpretor service and informed her of this. She initially was hesitant to go to ED --> however, then agreed.   She will inform the ED provider of my request to get CT Head.  As she is having worsening of her pain, shes needs to be evaluated in the ED - WITH CT HEAD TO RULE OUT BLEED OR MASS EFFECT.   May also benefit from migraine cocktail (if she is able to tolerate).  If CT head negative any indication of stroke --> can likely go ahead and resume the imitrex PRN and start the Topamax that she has been prescribed.    Priscella Mann, DO   01/09/2013, 4:42 PM

## 2013-01-09 NOTE — ED Notes (Signed)
Interpreter called

## 2013-01-09 NOTE — Telephone Encounter (Signed)
Pt called to see when CT would be.  She has waited several days and has not heard from [place]. I talked with Lela and We need to get certified before we schedule.  Pt informed. Pt also c/o increase pain with headaches.  Tylenol is not helping.  Is there anything else that she can take? Pt # [id number]

## 2013-01-09 NOTE — ED Provider Notes (Signed)
History     CSN: [id number]  Arrival date & time 01/09/13  1755   First MD Initiated Contact with Patient 01/09/13 2234      Chief Complaint  Patient presents with  . Headache    (Consider location/radiation/quality/duration/timing/severity/associated sxs/prior treatment) HPI Comments: Patient presents to the ER for evaluation of headache. Patient has been having intermittent headaches for almost one month. The headaches have been frequent and were somewhat improved with Imitrex but did not completely resolve. Patient has been seen in the ER as well as the internal medicine clinic multiple times. She has not had complete relief. Patient reports a throbbing headache which is moderate to severe. It is worsened by bright lights. She has associated nausea and vomiting.  Patient is a 42 y.o. female presenting with headaches.  Headache Associated symptoms: no fever     Past Medical History  Diagnosis Date  . GERD (gastroesophageal reflux disease)   . Hypothyroidism   . IBS (irritable bowel syndrome)   . Anxiety   . Hearing difficulty     b/l, 2/2 congenital rubella s/p stapedectomy. Using hearing aid  . Headache 04/2005    h/o headache in past that raised question of psuedotumor s/p therapeutic LP with an opening preassure of 18 cm H2O   . Hepatic cyst     6 mm on CT done done in 05/2005  . Family history of malignant neoplasm of gastrointestinal tract     Past Surgical History  Procedure Laterality Date  . Abdominal hysterectomy      partial hysterecotmy 2003 for endoemtriosis with residula right ovaey. Multiple GYN surgeries for chronic pelvic pain  . Cesarean section  1994  . Stapedectomy  2007    Dr. Ezzard Standing, left ear  . Laparoscopic ovarian cystectomy  2001  . Thyroid nodule removed  1997  . Total abdominal hysterectomy w/ bilateral salpingoophorectomy      left  . Abdominal adhesion surgery      removal from bowel  . Joint replacement      Family History  Problem  Relation Age of Onset  . Breast cancer Mother   . Ovarian cancer Mother   . Diabetes Mother   . Stomach cancer Maternal Grandmother   . Colon cancer Maternal Grandmother   . Diabetes Maternal Grandmother   . Pancreatic cancer Father   . Prostate cancer Father   . Colon cancer Father   . Diabetes Father   . Heart disease Father   . Irritable bowel syndrome Father   . Kidney disease Father   . Heart disease      both grandmothers  . Bipolar disorder Daughter     History  Substance Use Topics  . Smoking status: Former Smoker    Types: Cigarettes  . Smokeless tobacco: Not on file     Comment: quit 55yrs ago  . Alcohol Use: No    OB History   Grav Para Term Preterm Abortions TAB SAB Ect Mult Living   3 2   1 1    2       Review of Systems  Constitutional: Negative for fever.  Neurological: Positive for headaches.  All other systems reviewed and are negative.    Allergies  Onion; Shellfish allergy; Amoxicillin-pot clavulanate; Cefuroxime; Cephalexin; Ciprofloxacin; Clarithromycin; Clarithromycin; Doxycycline; Other; Prednisone; Propranolol; Shrimp flavor; Sulfonamide derivatives; and Tramadol hcl  Home Medications   Current Outpatient Rx  Name  Route  Sig  Dispense  Refill  . acetaminophen (TYLENOL) 500 MG tablet  Oral   Take 500 mg by mouth every 6 (six) hours as needed for pain. For pain/headache         . ALPRAZolam (XANAX) 0.25 MG tablet   Oral   Take 0.125-0.25 mg by mouth daily as needed. For anxiety         . Ascorbic Acid (VITAMIN C) 100 MG tablet   Oral   Take 100 mg by mouth daily.         Marland Kitchen FOLIC ACID PO   Oral   Take 1 tablet by mouth daily. OTC Folic Acid         . levothyroxine (SYNTHROID, LEVOTHROID) 25 MCG tablet   Oral   Take 1 tablet (25 mcg total) by mouth daily.   31 tablet   11   . Norethindrone-Ethinyl Estradiol-Fe Biphas (LO LOESTRIN FE) 1 MG-10 MCG / 10 MCG tablet   Oral   Take 1 tablet by mouth daily.   3 Package    4     Rx faxed to [facility] per pt requ ...   . ranitidine (ZANTAC) 150 MG tablet   Oral   Take 150 mg by mouth daily as needed for heartburn. For heartburn         . SUMAtriptan (IMITREX) 100 MG tablet   Oral   Take 50-100 mg by mouth See admin instructions. TAKE 1/2 TABLET AT THE ONSET OF HEADACHE. IF NOT IMPROVED AFTER 30 MINUTES TAKE THE SECOND HALF         . topiramate (TOPAMAX) 25 MG tablet   Oral   Take 1 tablet (25 mg total) by mouth at bedtime.   30 tablet   0     BP 137/76  Pulse 98  Temp(Src) 99.8 F (37.7 C) (Oral)  Resp 22  SpO2 96%  LMP 12/26/2012  Physical Exam  Constitutional: She is oriented to person, place, and time. She appears well-developed and well-nourished. No distress.  HENT:  Head: Normocephalic and atraumatic.  Nose: Nose normal.  Mouth/Throat: Oropharynx is clear and moist and mucous membranes are normal.  Eyes: Conjunctivae and EOM are normal. Pupils are equal, round, and reactive to light.  Neck: Normal range of motion. Neck supple.  Cardiovascular: Normal rate, regular rhythm, S1 normal and S2 normal.  Exam reveals no gallop and no friction rub.   No murmur heard. Pulmonary/Chest: Effort normal and breath sounds normal. No respiratory distress. She exhibits no tenderness.  Abdominal: Soft. Normal appearance and bowel sounds are normal. There is no hepatosplenomegaly. There is no tenderness. There is no rebound, no guarding, no tenderness at McBurney's point and negative Murphy's sign. No hernia.  Musculoskeletal: Normal range of motion.  Neurological: She is alert and oriented to person, place, and time. She has normal strength. No cranial nerve deficit or sensory deficit. Coordination normal. GCS eye subscore is 4. GCS verbal subscore is 5. GCS motor subscore is 6.  Skin: Skin is warm, dry and intact. No rash noted. No cyanosis.  Psychiatric: She has a normal mood and affect. Her speech is normal and behavior is normal.  Thought content normal.    ED Course  Procedures (including critical care time)  Labs Reviewed  CBC WITH DIFFERENTIAL  BASIC METABOLIC PANEL   Ct Head Wo Contrast  01/09/2013  *RADIOLOGY REPORT*  Clinical Data: Headaches since 08:00 a.m.  History of migraines. Previous history of surgery to the left ear.  CT HEAD WITHOUT CONTRAST  Technique:  Contiguous axial images  were obtained from the base of the skull through the vertex without contrast.  Comparison: CT orbital and facial bones 12/01/2012.  CT head 07/12/2007.  Findings: The ventricles and sulci are symmetrical without significant effacement, displacement, or dilatation. No mass effect or midline shift. No abnormal extra-axial fluid collections. The grey-white matter junction is distinct. Basal cisterns are not effaced. No acute intracranial hemorrhage. No depressed skull fractures.  Visualized paranasal sinuses and mastoid air cells are not opacified.  No significant changes since the previous study.  IMPRESSION: No acute intracranial abnormalities.   Original Report Authenticated By: Burman Nieves, M.D.      Diagnosis: Headache, possibly migraine    MDM  This presents to the ER for evaluation of headache. She has been having frequent headache for nearly one month. Patient has been seen in the ER for this. Her initial presentation included by this and swelling around her right eye with a headache. This has resolved, the patient is now still experiencing frequent headaches. She has been put on multiple medications but her Dr., including Imitrex. She has had some improvement. Patient comes in tonight because of worsening headache. Her examination is unremarkable. Neuro exam is normal. There is no facial swelling or redness. Based on the patient's presentation a persistent headache the headache with symptoms of redness and swelling around the face, cavernous sinus thrombosis is considered. Head CT today did not show any evidence. Patient  will require MRI and MRV. Testing not available at this time of night. Case discussed with internal medicine resident, Dr. Clyde Lundborg. He will ensure that patient gets MRI/MRV.   Patient administered Toradol, Compazine and Benadryl for the headache. She had some restlessness after the medications. This might of been secondary to Compazine, patient now states that she has had some similar symptoms after taking Benadryl many years ago. Patient given Ativan for the symptoms.     Gilda Crease, MD 01/10/13 [phone]

## 2013-01-09 NOTE — ED Notes (Signed)
Pt up to desk with interpreter. Mentions order for head CT from Dr. Criselda Peaches downstairs in [facility], old order from 4/23 noted in system, CT not aware of order, Dr. Manus Gunning EDP made aware, new order received. Pt & CT updated.

## 2013-01-09 NOTE — ED Notes (Signed)
Per pt sts 3 days of HA. sts she was told to come here by her do tor for CT scan and to hold Imitrex if the HA has gotten worse.

## 2013-01-10 ENCOUNTER — Telehealth: Payer: Self-pay | Admitting: Internal Medicine

## 2013-01-10 MED ORDER — MORPHINE SULFATE 4 MG/ML IJ SOLN
4.0000 mg | Freq: Once | INTRAMUSCULAR | Status: AC
  Administered 2013-01-10: 4 mg via INTRAVENOUS
  Filled 2013-01-10: qty 1

## 2013-01-10 NOTE — Telephone Encounter (Signed)
I received a call from Dr. Blinda Leatherwood in ED, asking me to ensure that [name] will get MRI/MRV to rule out cavernous sins thrombosis. Patient was evaluated in ED because of headache on the night of 01/09/13. She is currently being treated with Sumatriptan presumably for migraine headache, but without significant improvement. Patient has swelling around her right eye per Dr. Bing Plume examination. Dr. Blinda Leatherwood thinks that patient may have cavernous sinus thrombosis. Head CT done in ED did not show any evidence. Dr. Blinda Leatherwood said clearly that I do not need to evaluate patient in ED, but he strongly suggested and asked me to ensure that patient will get MRI and MRV as outpatient.  I sent a message to front desk and asked for a follow up appointment with her PCP, or any other resident if her PCP is not available in early next week.   Lorretta Harp, MD PGY2, Internal Medicine Teaching Service Pager: [phone]

## 2013-01-10 NOTE — ED Notes (Addendum)
Pt sleeping. RN in to take VS. Pt woke up, states pain is now 8/10 down from 10/10

## 2013-01-10 NOTE — ED Notes (Signed)
Pt reporting anxiety from medication. MD made aware. Orders to follow.

## 2013-01-12 ENCOUNTER — Encounter: Payer: Self-pay | Admitting: Internal Medicine

## 2013-01-12 ENCOUNTER — Other Ambulatory Visit: Payer: Self-pay | Admitting: Internal Medicine

## 2013-01-12 DIAGNOSIS — G43909 Migraine, unspecified, not intractable, without status migrainosus: Secondary | ICD-10-CM

## 2013-01-13 ENCOUNTER — Other Ambulatory Visit: Payer: Self-pay | Admitting: Internal Medicine

## 2013-01-13 ENCOUNTER — Encounter: Payer: BC Managed Care – PPO | Admitting: Internal Medicine

## 2013-01-13 ENCOUNTER — Encounter: Payer: Self-pay | Admitting: *Deleted

## 2013-01-13 DIAGNOSIS — G43909 Migraine, unspecified, not intractable, without status migrainosus: Secondary | ICD-10-CM

## 2013-01-14 ENCOUNTER — Encounter: Payer: Self-pay | Admitting: Internal Medicine

## 2013-01-15 ENCOUNTER — Other Ambulatory Visit: Payer: Self-pay | Admitting: Internal Medicine

## 2013-01-15 MED ORDER — HYDROCODONE-ACETAMINOPHEN 5-325 MG PO TABS: 1.0000 | ORAL_TABLET | ORAL | Status: DC | PRN

## 2013-01-17 ENCOUNTER — Ambulatory Visit
Admission: RE | Admit: 2013-01-17 | Discharge: 2013-01-17 | Disposition: A | Discharge status: Home / Self Care | Payer: BC Managed Care – PPO | Source: Ambulatory Visit | Attending: Internal Medicine | Admitting: Internal Medicine

## 2013-01-17 ENCOUNTER — Other Ambulatory Visit: Payer: BC Managed Care – PPO

## 2013-01-17 DIAGNOSIS — G43909 Migraine, unspecified, not intractable, without status migrainosus: Secondary | ICD-10-CM

## 2013-01-17 MED ORDER — GADOBENATE DIMEGLUMINE 529 MG/ML IV SOLN
18.0000 mL | Freq: Once | INTRAVENOUS | Status: AC | PRN
  Administered 2013-01-17: 18 mL via INTRAVENOUS

## 2013-01-19 ENCOUNTER — Encounter: Payer: Self-pay | Admitting: Internal Medicine

## 2013-01-19 ENCOUNTER — Other Ambulatory Visit: Payer: BC Managed Care – PPO

## 2013-01-19 ENCOUNTER — Ambulatory Visit (INDEPENDENT_AMBULATORY_CARE_PROVIDER_SITE_OTHER): Payer: BC Managed Care – PPO | Admitting: Internal Medicine

## 2013-01-19 VITALS — BP 122/81 | HR 86 | Temp 98.9°F | Wt 193.4 lb

## 2013-01-19 DIAGNOSIS — E039 Hypothyroidism, unspecified: Secondary | ICD-10-CM

## 2013-01-19 DIAGNOSIS — G43909 Migraine, unspecified, not intractable, without status migrainosus: Secondary | ICD-10-CM

## 2013-01-19 DIAGNOSIS — N643 Galactorrhea not associated with childbirth: Secondary | ICD-10-CM

## 2013-01-19 NOTE — Progress Notes (Signed)
Subjective:    Patient ID: Donna Davis, female    DOB: 1971-08-23, 42 y.o.   MRN: [id number]  Donna Davis comes in for follow up of headaches/migraines.   HPI  Donna Davis has been having migraines since about March.  These have been difficult to control.  She has presented to the ED and had multiple imaging studies including a CT head and MRI/MRA.  The MRI/MRA revealed a partial empty sella, which does not seem to be new.  Today she reports that she has also had bilateral milky galactorrhea and severe hot flashes since the headache started.  The HA was so bad that she neglected to mention these other symptoms to the other providers.  She notes that the galactorrhea is expressible, non tender, not associated with new breast mass or axillary lumps/bumps.  She says that it looks like breast milk.  She also has hot flashes which are severe.  She   Donna Davis reports that she has developed galactorrhea and hot flashes since the migraines started.  She notes that the hot flashes are so bad that she has to be standing in front of the air vent.  All day, at night also with sweating.  Wet the bed sheets.    Previous history of migraines prior to having her daughter.  They got a lot worse with the pregnancy.  Then improved after her pregnancy.  She maybe had another episode in the many years since then.    Hysterectomy 7 years ago with left oophorectomy.  2 years ago, she was told that her right ovary "stopped working."  Previously she has had hot flashes with sweats, only at night, tolerable.   She has also been told back in the past that she had a microadenoma in the pituitary gland.   The MRI/MRA appeared normal with an empty sella, we discussed the results.    She reports a MMG from this year was normal.   Review of Systems  Constitutional: Positive for activity change and unexpected weight change. Negative for fever.       She has been exercising, but gaining weight  HENT: Positive for hearing loss.      She is deaf  Respiratory: Negative for chest tightness and shortness of breath.   Cardiovascular: Positive for palpitations. Negative for leg swelling.  Gastrointestinal: Negative for abdominal distention.       Decreased appetite, gaining weight  Endocrine: Positive for heat intolerance.  Genitourinary: Negative for vaginal discharge and vaginal pain.  Skin: Negative for rash.  Neurological: Positive for headaches.  Psychiatric/Behavioral: Negative for confusion and decreased concentration.       Objective:   Physical Exam  Constitutional: She is oriented to person, place, and time. She appears well-developed and well-nourished. No distress.  Sign language interpreter in the room  HENT:  Head: Normocephalic and atraumatic.  Mouth/Throat: Oropharynx is clear and moist. No oropharyngeal exudate.  Neck: Trachea normal. Neck supple. No mass and no thyromegaly present.  Cardiovascular: Normal rate, regular rhythm and normal heart sounds.   No murmur heard. Pulmonary/Chest: Effort normal and breath sounds normal. No respiratory distress. She has no wheezes. Right breast exhibits nipple discharge. Right breast exhibits no inverted nipple, no mass, no skin change and no tenderness. Left breast exhibits nipple discharge. Left breast exhibits no inverted nipple, no mass, no skin change and no tenderness. Breasts are symmetrical.  + milky discharge from both breasts, guaic negative bilaterally.   Abdominal: Soft. Bowel sounds are normal.  Musculoskeletal: She exhibits no edema and no tenderness.  Neurological: She is alert and oriented to person, place, and time.  Skin: Skin is warm and dry. No rash noted. No erythema.  Psychiatric: She has a normal mood and affect. Her behavior is normal.  She is somewhat frustrated with her symptoms    Labs: prolactin, TSH, free T4, BMET for today,       Assessment & Plan:  RTC in mid June (4-6 weeks) for follow up, will call with lab results.

## 2013-01-19 NOTE — Patient Instructions (Addendum)
Please schedule a follow up visit within the next 4-6 weeks.   For your medications:   Please bring all of your pill  Bottles with you to each visit.  This will help make sure that we have an up to date list of all the medications you are taking.  Please also bring any over the counter herbal medications you are taking (not including advil, tylenol, etc.)  Please increase your Topamax to 25mg  twice a day for your headaches.   You will have lab work done today, I will call you with the results.  I am concerned that you have a Pituitary Tumor - you have been told previously that you have a microadenoma of the pituitary, which is a type of tumor (growth).  The lab results should help determine this.  Below is some information about the pituitary and what symptoms a tumor (growth) could cause.  Please let me know if you have any change or worsening of your symptoms.   Thank you!  Pituitary Tumors Pituitary tumors are abnormal growths found in the pituitary gland. This is a Designer, jewellery. It is about the size of a dime and is located in the center of the brain. It makes hormones that affect growth and the functions of other glands in the body. Most pituitary tumors are benign. This means they are non-cancerous. They grow slowly and do not spread to other parts of the body. A pituitary tumor may make the pituitary gland produce too many hormones. This can cause other problems in the body. Tumors that make hormones are called functioning tumors. Those that do not make hormones are called non-functioning tumors. Certain pituitary tumors cause Cushing's disease. This disease causes fat to build up in the face, back and chest. The arms and legs become thin. Other pituitary tumors can cause acromegaly which is a condition in which the hands, feet and face are larger than normal. Another type of tumor can cause breasts to make milk even though there is no pregnancy.   SYMPTOMS  Symptoms may  include:  Headaches.  Vision problems.  Nausea and vomiting.  Any of the problems caused by the production of too many hormones, such as:  Infertility or loss of menstrual periods in women.  Abnormal growth.  High blood pressure.  Heat or cold intolerance.  Other skin and body changes.   Document Released: 08/24/2002 Document Revised: 11/26/2011 Document Reviewed: 08/31/2008 [facility] Patient Information 2013 [place], [place].

## 2013-01-19 NOTE — Progress Notes (Signed)
INTERNAL MEDICINE TEACHING ATTENDING ADDENDUM - Inez Catalina, MD: I reviewed with the resident Dr. Saralyn Pilar, Ms. Kunzler's  medical history, physical examination, diagnosis and results of tests and treatment and I agree with the patient's care as documented.

## 2013-01-19 NOTE — Assessment & Plan Note (Signed)
She is on therapy, but with her worsening symptoms, will recheck TSH/T4 today.

## 2013-01-19 NOTE — Assessment & Plan Note (Addendum)
Associated symptoms include hot flashes, weight gain, decreased appetite and prolonged migraine.   MRI results noted a partial empty sella, previous from 2006 revealed an arachnoid hemorrhage into the sella.  An MRI in early 2001 revealed a partial empty sella at that time too.  A repeat MRI in 12/1999 could not rule out a microadenoma of the pituitary.  Most of these symptoms have been concerning for pseudotumor cerebri.   She has had an [facility] 3 years ago which was in the post menopausal range, checked in February of this year and decreased to 26.6, which is still in the post menopausal range.    The migraine/headache does make me concerned for peudotumor cerebri, however with the concomitant galactorrhea and hot flashes, will start with an endocrine work up for prolactinemia.  Blood work today will be prolactin, FSH, TSH, T4, BMET.  If these do not give some insight, will proceed with LP for opening pressure.

## 2013-01-20 LAB — BASIC METABOLIC PANEL
CO2: 22 mEq/L (ref 19–32)
Calcium: 9.1 mg/dL (ref 8.4–10.5)
Creat: 0.65 mg/dL (ref 0.50–1.10)
Glucose, Bld: 75 mg/dL (ref 70–99)
Sodium: 138 mEq/L (ref 135–145)

## 2013-01-20 LAB — FOLLICLE STIMULATING HORMONE: FSH: 28.3 m[IU]/mL

## 2013-01-20 LAB — PROLACTIN: Prolactin: 10.5 ng/mL

## 2013-01-21 ENCOUNTER — Encounter: Payer: Self-pay | Admitting: Internal Medicine

## 2013-01-21 NOTE — Assessment & Plan Note (Signed)
Migraine improved, further work up continuing as noted above.

## 2013-01-23 ENCOUNTER — Encounter: Payer: BC Managed Care – PPO | Admitting: Internal Medicine

## 2013-01-30 ENCOUNTER — Encounter: Payer: Self-pay | Admitting: Internal Medicine

## 2013-01-30 ENCOUNTER — Other Ambulatory Visit: Payer: Self-pay | Admitting: Internal Medicine

## 2013-01-30 ENCOUNTER — Other Ambulatory Visit: Payer: Self-pay | Admitting: *Deleted

## 2013-01-30 MED ORDER — TOPIRAMATE 25 MG PO TABS: 25.0000 mg | ORAL_TABLET | Freq: Two times a day (BID) | ORAL | Status: DC

## 2013-01-30 MED ORDER — TOPIRAMATE 25 MG PO TABS: 25.0000 mg | ORAL_TABLET | Freq: Every day | ORAL | Status: DC

## 2013-01-30 NOTE — Telephone Encounter (Signed)
Called to pharm 

## 2013-01-31 ENCOUNTER — Other Ambulatory Visit: Payer: Self-pay | Admitting: Internal Medicine

## 2013-02-02 ENCOUNTER — Other Ambulatory Visit: Payer: Self-pay | Admitting: Internal Medicine

## 2013-02-02 ENCOUNTER — Telehealth: Payer: Self-pay | Admitting: *Deleted

## 2013-02-02 ENCOUNTER — Other Ambulatory Visit (INDEPENDENT_AMBULATORY_CARE_PROVIDER_SITE_OTHER): Payer: Self-pay

## 2013-02-02 ENCOUNTER — Encounter: Payer: Self-pay | Admitting: *Deleted

## 2013-02-02 ENCOUNTER — Encounter: Payer: Self-pay | Admitting: Internal Medicine

## 2013-02-02 DIAGNOSIS — N643 Galactorrhea not associated with childbirth: Secondary | ICD-10-CM

## 2013-02-02 MED ORDER — TOPIRAMATE 25 MG PO TABS: 25.0000 mg | ORAL_TABLET | Freq: Two times a day (BID) | ORAL | Status: DC

## 2013-02-02 NOTE — Telephone Encounter (Signed)
Message copied by Hassan Buckler on Mon Feb 02, 2013  1:53 PM ------      Message from: Debe Coder B      Created: Mon Feb 02, 2013  1:08 PM      Regarding: Schedule for breast ultrasound       Hi [name] -             I have put in an order for Ms. Kincade to have bilateral breast ultrasounds for her galactorrhea.  Could you schedule this for her?  Also, she can only be contacted by video phone (numbers available in previous email contacts with her) or by My chart message for the scheduled time.  I am about to My chart message her, I will attach you to it, to inform her about this plan.             Thanks!!            EBM ------

## 2013-02-02 NOTE — Telephone Encounter (Signed)
I called [facility] on [address]; appt has been scheduled for Breast U/S Friday 02/06/13 @ 1000AM. E-mail sent to pt via My Chart.

## 2013-02-02 NOTE — Telephone Encounter (Signed)
Topamax 25mg   2 times daily rx called to [facility].

## 2013-02-02 NOTE — Telephone Encounter (Signed)
[name] -   Can you make sure this updated Rx gets sent to Ms. Mcarthy's pharmacy?   Thank you  EBM

## 2013-02-03 ENCOUNTER — Telehealth: Payer: Self-pay | Admitting: *Deleted

## 2013-02-03 NOTE — Telephone Encounter (Signed)
Error

## 2013-02-04 ENCOUNTER — Other Ambulatory Visit: Payer: Self-pay | Admitting: Internal Medicine

## 2013-03-09 ENCOUNTER — Encounter: Payer: Self-pay | Admitting: Internal Medicine

## 2013-03-09 ENCOUNTER — Ambulatory Visit (INDEPENDENT_AMBULATORY_CARE_PROVIDER_SITE_OTHER): Payer: BC Managed Care – PPO | Admitting: Internal Medicine

## 2013-03-09 VITALS — BP 132/82 | HR 87 | Temp 98.2°F | Wt 177.6 lb

## 2013-03-09 DIAGNOSIS — F411 Generalized anxiety disorder: Secondary | ICD-10-CM

## 2013-03-09 DIAGNOSIS — Z0000 Encounter for general adult medical examination without abnormal findings: Secondary | ICD-10-CM

## 2013-03-09 DIAGNOSIS — G43909 Migraine, unspecified, not intractable, without status migrainosus: Secondary | ICD-10-CM

## 2013-03-09 DIAGNOSIS — N643 Galactorrhea not associated with childbirth: Secondary | ICD-10-CM

## 2013-03-09 DIAGNOSIS — E039 Hypothyroidism, unspecified: Secondary | ICD-10-CM

## 2013-03-09 MED ORDER — LEVOTHYROXINE SODIUM 25 MCG PO TABS: 25.0000 ug | ORAL_TABLET | Freq: Every day | ORAL | Status: DC

## 2013-03-09 NOTE — Assessment & Plan Note (Addendum)
Bilateral discharge with negative guaic is usually "normal".  Medical work up has been negative to date.   Lab work including TSH, FSH, proloactin normal.  MRI unchanged from previous (please see report).  Mammogram and breast ultrasound with evaluation of ducts were normal.  Guaiac done on expressed milk was negative.  Next on differential would be medications.  She is on a BCP.  I advised her to attempt stopping this medication (she is on for post menopausal symptoms s/p hysterectomy), however, she refused stopping due to effects of the menopausal symptoms.  I further advised her to see her Gynecologist and discuss an alternate therapy to see if the galactorrhea would improve.

## 2013-03-09 NOTE — Addendum Note (Signed)
Addended by: Debe Coder B on: 03/09/2013 02:54 PM   Modules accepted: Orders

## 2013-03-09 NOTE — Patient Instructions (Addendum)
Please schedule a follow up visit on 04/06/13, please stop by the front desk to make this appointment.   For your medications:   Please bring all of your pill  Bottles with you to each visit.  This will help make sure that we have an up to date list of all the medications you are taking.  Please also bring any over the counter herbal medications you are taking (not including advil, tylenol, etc.)  Please continue taking your medications except alprazolam, which you have stopped on your own.    Also, please wean yourself off of Topamax, as it seems to be decreasing your focus.  Please decrease to 1 tablet once a day (at night) for 2 weeks and then stop.   Your synthroid will be filled for you.   Please make an appointment with your Gynecologist to discuss possibly changing your birth control pills at it might be contributing to your galactorrhea (nipple discharge).  Your work up with imaging and labs have shown no reason for you to be having this issue and your medications may be the culprit.    Please email me with any questions or changes that you notice.   Thank you!

## 2013-03-09 NOTE — Assessment & Plan Note (Signed)
Her anxiety is not well controlled.  She is having palpitations and appears very anxious on exam.  She has multiple ongoing stressors.  She has stopped her BZD on her own because it was making her too emotional.  She was not taking a high dose (0.125 - 0.25) so stopping without taper should be okay.  We discussed other options including an SSRI, however, she refused as she does not want to increase her side effects.  I think an SSRI may not be a good choice due to her galactorrhea as well.   Will continue to monitor.  Advised her that she can email the clinic through mychart at any time if she should need to.

## 2013-03-09 NOTE — Progress Notes (Signed)
Subjective:    Patient ID: Donna Davis, female    DOB: 08/02/1971, 42 y.o.   MRN: [id number]  F/U headaches.   HPI  Donna Davis is a 42yo woman with h/o anxiety, depression, GERD, hypothyroidism and migraines who presents for follow up of headache and galactorrhea.    Work up to date has been negative including mammogram, ultrasound with duct evaluation which did not reveal a source of the galactorrhea.  She has also had a lab evaluation including TSH, FSH, prolactin, BMP which were all normal. Breast discharge was evaluated and was guaiac negative.  She had no palpable breast masses or axillary lymph nodes on exam.  She has been initiated on topamax for her migraines along with abortive imitrex.   Today, Donna Davis reports that she continues to have galactorrhea, very small amounts, which is whitish in color.  This has not changed.  She has been on her BCP for about 4 years and no changes there, she is not on an SSRI.  She continues to have daily headaches for which she takes tylenol as needed (usually twice per day).   She has chronic tinnitus and difficulty concentrating.  She also reports blurry vision.  She has taken herself off of her alprazolam due to her feeling that it makes her more emotional.  She would also like to discuss stopping the topamax because it makes her thinking fuzzy and she has to start back to school in the fall.   She has a significant amount of psychosocial stress right now as she is in the process of being separated from her husband.  She lives in a very nice house, he has suggested that they live in separate bedrooms, but she is not happy about this situation.  She, however, does not want to move to more affordable housing.  She is very concerned that if she proceeds with divorce, she would not receive alimony.  These stressors are the main thing that Donna Davis wants to discuss today.  She has signed up for [facility] for therapy, which she will start this week.  She is having more  palpitations since stopping her benzodiazepine.   She is requesting a refill on synthroid.   She is not smoking, she quit 5 years ago.    Review of Systems  Constitutional: Positive for appetite change. Negative for fever and activity change.  HENT: Positive for tinnitus (chronic). Negative for sore throat and trouble swallowing.   Eyes: Positive for photophobia (with headache) and visual disturbance (blurry).  Respiratory: Negative for cough and shortness of breath.   Cardiovascular: Positive for palpitations (since being off alprazolam). Negative for chest pain and leg swelling.  Gastrointestinal: Negative for diarrhea and constipation.  Genitourinary: Negative for dysuria and difficulty urinating.  Skin: Negative for rash and wound.  Neurological: Positive for headaches. Negative for facial asymmetry.       Feeling like she can't focus, decreased concentration  Hematological: Negative for adenopathy.  Psychiatric/Behavioral: Positive for decreased concentration. Negative for confusion.       Objective:   Physical Exam  Nursing note and vitals reviewed. Constitutional: She is oriented to person, place, and time. She appears well-developed and well-nourished. No distress.  HENT:  Head: Normocephalic and atraumatic.  Mouth/Throat: Oropharynx is clear and moist. No oropharyngeal exudate.  Eyes: Conjunctivae, EOM and lids are normal. No scleral icterus.  Fundoscopic exam:      The right eye shows no papilledema (unable to see fundus). The right eye shows  red reflex.       The left eye shows no papilledema (unable to see fundus).  Cardiovascular: Normal rate, regular rhythm and normal heart sounds.   Pulmonary/Chest: Effort normal and breath sounds normal. No respiratory distress. She has no wheezes.  Abdominal: Soft. Bowel sounds are normal.  Neurological: She is alert and oriented to person, place, and time.  Skin: Skin is warm and dry.  Psychiatric: Thought content normal. Her  mood appears anxious. Her affect is angry. Her affect is not inappropriate. She is not aggressive and not hyperactive. She exhibits a depressed mood.    No labs today      Assessment & Plan:  Follow up in 4 weeks.

## 2013-03-09 NOTE — Assessment & Plan Note (Signed)
Continues to have daily headache, but are improved compared to previous.  She is taking regular tylenol, so these may be partly rebound in nature due to the chronic analgesic use.  She does want to be off the topamax.  I advised she wean off of this medication and see if her "fuzzy headed" feeling improves.    She has not tolerated beta blocker, so next therapy would be possibly a TCA (this could maybe help with depression as well?).  Will discuss further when I next see her.    I have also been concerned for pseudotumor cerebri.  Eye exam from March 18 of this year noted a normal fundus.  We discussed LP at length and Ms. Karabin refused this procedure (has needed blood patch in the past).  Will continue to keep this entity on differential while treating for migraines/rebound headache.

## 2013-03-09 NOTE — Assessment & Plan Note (Signed)
Last TSH 1.39, T4 1.14.   Continue synthroid at current dose.

## 2013-03-11 ENCOUNTER — Encounter: Payer: Self-pay | Admitting: Internal Medicine

## 2013-03-12 ENCOUNTER — Other Ambulatory Visit: Payer: Self-pay | Admitting: *Deleted

## 2013-03-12 NOTE — Telephone Encounter (Signed)
Done

## 2013-03-14 ENCOUNTER — Emergency Department (HOSPITAL_COMMUNITY): Payer: BC Managed Care – PPO

## 2013-03-14 ENCOUNTER — Encounter (HOSPITAL_COMMUNITY): Payer: Self-pay | Admitting: *Deleted

## 2013-03-14 ENCOUNTER — Emergency Department (HOSPITAL_COMMUNITY)
Admission: EM | Admit: 2013-03-14 | Discharge: 2013-03-14 | Disposition: A | Discharge status: Home / Self Care | Payer: BC Managed Care – PPO | Attending: Emergency Medicine | Admitting: Emergency Medicine

## 2013-03-14 DIAGNOSIS — Z888 Allergy status to other drugs, medicaments and biological substances status: Secondary | ICD-10-CM | POA: Insufficient documentation

## 2013-03-14 DIAGNOSIS — Z881 Allergy status to other antibiotic agents status: Secondary | ICD-10-CM | POA: Insufficient documentation

## 2013-03-14 DIAGNOSIS — Z87891 Personal history of nicotine dependence: Secondary | ICD-10-CM | POA: Insufficient documentation

## 2013-03-14 DIAGNOSIS — Z79899 Other long term (current) drug therapy: Secondary | ICD-10-CM | POA: Insufficient documentation

## 2013-03-14 DIAGNOSIS — M545 Low back pain, unspecified: Secondary | ICD-10-CM | POA: Insufficient documentation

## 2013-03-14 DIAGNOSIS — Z800 Family history of malignant neoplasm of digestive organs: Secondary | ICD-10-CM | POA: Insufficient documentation

## 2013-03-14 DIAGNOSIS — Z882 Allergy status to sulfonamides status: Secondary | ICD-10-CM | POA: Insufficient documentation

## 2013-03-14 DIAGNOSIS — Z8679 Personal history of other diseases of the circulatory system: Secondary | ICD-10-CM | POA: Insufficient documentation

## 2013-03-14 DIAGNOSIS — R110 Nausea: Secondary | ICD-10-CM | POA: Insufficient documentation

## 2013-03-14 DIAGNOSIS — H9190 Unspecified hearing loss, unspecified ear: Secondary | ICD-10-CM | POA: Insufficient documentation

## 2013-03-14 DIAGNOSIS — R109 Unspecified abdominal pain: Secondary | ICD-10-CM | POA: Insufficient documentation

## 2013-03-14 DIAGNOSIS — Z8719 Personal history of other diseases of the digestive system: Secondary | ICD-10-CM | POA: Insufficient documentation

## 2013-03-14 DIAGNOSIS — F411 Generalized anxiety disorder: Secondary | ICD-10-CM | POA: Insufficient documentation

## 2013-03-14 DIAGNOSIS — M549 Dorsalgia, unspecified: Secondary | ICD-10-CM

## 2013-03-14 DIAGNOSIS — E039 Hypothyroidism, unspecified: Secondary | ICD-10-CM | POA: Insufficient documentation

## 2013-03-14 LAB — URINALYSIS, ROUTINE W REFLEX MICROSCOPIC
Bilirubin Urine: NEGATIVE
Glucose, UA: NEGATIVE mg/dL
Ketones, ur: NEGATIVE mg/dL
Leukocytes, UA: NEGATIVE
Protein, ur: NEGATIVE mg/dL
pH: 6 (ref 5.0–8.0)

## 2013-03-14 MED ORDER — SODIUM CHLORIDE 0.9 % IV SOLN
INTRAVENOUS | Status: DC
  Administered 2013-03-14: 1000 mL via INTRAVENOUS

## 2013-03-14 MED ORDER — OXYCODONE-ACETAMINOPHEN 5-325 MG PO TABS: 2.0000 | ORAL_TABLET | ORAL | Status: DC | PRN

## 2013-03-14 MED ORDER — MORPHINE SULFATE 4 MG/ML IJ SOLN
4.0000 mg | Freq: Once | INTRAMUSCULAR | Status: AC
  Administered 2013-03-14: 4 mg via INTRAVENOUS
  Filled 2013-03-14: qty 1

## 2013-03-14 MED ORDER — ONDANSETRON HCL 4 MG/2ML IJ SOLN
4.0000 mg | Freq: Once | INTRAMUSCULAR | Status: AC
  Administered 2013-03-14: 4 mg via INTRAVENOUS
  Filled 2013-03-14: qty 2

## 2013-03-14 NOTE — ED Provider Notes (Signed)
History    CSN: [id number] Arrival date & time 03/14/13  1316  First MD Initiated Contact with Patient 03/14/13 1323     Chief complaint left flank pain  (Consider location/radiation/quality/duration/timing/severity/associated sxs/prior Treatment) HPI  Patient states yesterday afternoon she had acute onset of left flank/left upper quadrant pain that started after she bent over to pick up something and she stood back up. She states the pain is constant but waxes and wanes. She states it is knifelike and throbs when she moves. She states any type of movement makes it feel worse. She has had nausea without vomiting. She denies fever or hematuria. She states she took a muscle relaxer without improvement. She does also report decreased urinary output. She denies any cough or shortness of breath. She states she's never had this before.  Pt is hearing impaired and lip reads    PCP Dr Criselda Peaches [facility]  Past Medical History  Diagnosis Date  . GERD (gastroesophageal reflux disease)   . Hypothyroidism   . IBS (irritable bowel syndrome)   . Anxiety   . Hearing difficulty     b/l, 2/2 congenital rubella s/p stapedectomy. Using hearing aid  . Headache(784.0) 04/2005    h/o headache in past that raised question of psuedotumor s/p therapeutic LP with an opening preassure of 18 cm H2O   . Hepatic cyst     6 mm on CT done done in 05/2005  . Family history of malignant neoplasm of gastrointestinal tract    Past Surgical History  Procedure Laterality Date  . Abdominal hysterectomy      partial hysterecotmy 2003 for endoemtriosis with residula right ovaey. Multiple GYN surgeries for chronic pelvic pain  . Cesarean section  1994  . Stapedectomy  2007    Dr. Ezzard Standing, left ear  . Laparoscopic ovarian cystectomy  2001  . Thyroid nodule removed  1997  . Total abdominal hysterectomy w/ bilateral salpingoophorectomy      left  . Abdominal adhesion surgery      removal from bowel  . Joint replacement       Family History  Problem Relation Age of Onset  . Breast cancer Mother   . Ovarian cancer Mother   . Diabetes Mother   . Stomach cancer Maternal Grandmother   . Colon cancer Maternal Grandmother   . Diabetes Maternal Grandmother   . Pancreatic cancer Father   . Prostate cancer Father   . Colon cancer Father   . Diabetes Father   . Heart disease Father   . Irritable bowel syndrome Father   . Kidney disease Father   . Heart disease      both grandmothers  . Bipolar disorder Daughter    History  Substance Use Topics  . Smoking status: Former Smoker    Types: Cigarettes  . Smokeless tobacco: Not on file     Comment: quit 75yrs ago  . Alcohol Use: No  college student Quit smoking 5 years ago   OB History   Grav Para Term Preterm Abortions TAB SAB Ect Mult Living   3 2   1 1    2      Review of Systems  All other systems reviewed and are negative.    Allergies  Onion; Shellfish allergy; Amoxicillin-pot clavulanate; Cefuroxime; Cephalexin; Ciprofloxacin; Clarithromycin; Clarithromycin; Doxycycline; Other; Prednisone; Propranolol; Shrimp flavor; Sulfonamide derivatives; and Tramadol hcl  Home Medications   Current Outpatient Rx  Name  Route  Sig  Dispense  Refill  . acetaminophen (  TYLENOL) 500 MG tablet   Oral   Take 500 mg by mouth every 6 (six) hours as needed for pain.         . cyclobenzaprine (FLEXERIL) 5 MG tablet   Oral   Take 5 mg by mouth 3 (three) times daily as needed for muscle spasms.         . FOLIC ACID PO   Oral   Take 1 tablet by mouth daily. OTC Folic Acid         . ibuprofen (ADVIL,MOTRIN) 200 MG tablet   Oral   Take 200 mg by mouth every 6 (six) hours as needed for pain.         Marland Kitchen levothyroxine (SYNTHROID, LEVOTHROID) 25 MCG tablet   Oral   Take 1 tablet (25 mcg total) by mouth daily.   31 tablet   11   . Norethindrone-Ethinyl Estradiol-Fe Biphas (LO LOESTRIN FE) 1 MG-10 MCG / 10 MCG tablet   Oral   Take 1 tablet by mouth  daily.   3 Package   4     Rx faxed to [facility] per pt requ ...   . topiramate (TOPAMAX) 25 MG tablet   Oral   Take 1 tablet (25 mg total) by mouth 2 (two) times daily.   60 tablet   3   . SUMAtriptan (IMITREX) 100 MG tablet   Oral   Take 50-100 mg by mouth See admin instructions. TAKE 1/2 TABLET AT THE ONSET OF HEADACHE. IF NOT IMPROVED AFTER 30 MINUTES TAKE THE SECOND HALF          BP 118/72  Pulse 104  Temp(Src) 99.2 F (37.3 C) (Oral)  Resp 18  Ht 5' (1.524 m)  Wt 177 lb (80.287 kg)  BMI 34.57 kg/m2  SpO2 100%  LMP 12/26/2012  Vital signs normal except low grade tach  Physical Exam  Nursing note and vitals reviewed. Constitutional: She is oriented to person, place, and time. She appears well-developed and well-nourished.  Non-toxic appearance. She does not appear ill. No distress.  Pt lip reads  HENT:  Head: Normocephalic and atraumatic.  Right Ear: External ear normal.  Left Ear: External ear normal.  Nose: Nose normal. No mucosal edema or rhinorrhea.  Mouth/Throat: Oropharynx is clear and moist and mucous membranes are normal. No dental abscesses or edematous.  Eyes: Conjunctivae and EOM are normal. Pupils are equal, round, and reactive to light.  Neck: Normal range of motion and full passive range of motion without pain. Neck supple.  Cardiovascular: Normal rate, regular rhythm and normal heart sounds.  Exam reveals no gallop and no friction rub.   No murmur heard. Pulmonary/Chest: Effort normal and breath sounds normal. No respiratory distress. She has no wheezes. She has no rhonchi. She has no rales. She exhibits no tenderness and no crepitus.  Abdominal: Soft. Normal appearance and bowel sounds are normal. She exhibits no distension. There is tenderness. There is no rebound and no guarding.    Tender in LUQ and in her left flank area  Musculoskeletal: Normal range of motion. She exhibits no edema and no tenderness.       Back:  Moves  all extremities well.   Neurological: She is alert and oriented to person, place, and time. She has normal strength. No cranial nerve deficit.  Skin: Skin is warm, dry and intact. No rash noted. No erythema. No pallor.  Psychiatric: She has a normal mood and affect. Her speech is normal  and behavior is normal. Her mood appears not anxious.    ED Course  Procedures (including critical care time)  Medications  0.9 %  sodium chloride infusion (1,000 mLs Intravenous New Bag/Given 03/14/13 1448)  morphine 4 MG/ML injection 4 mg (4 mg Intravenous Given 03/14/13 1449)  ondansetron (ZOFRAN) injection 4 mg (4 mg Intravenous Given 03/14/13 1449)   16:30 Dr Fredderick Phenix to get results of CT scan and disposition patient.    Results for orders placed during the hospital encounter of 03/14/13  URINALYSIS, ROUTINE W REFLEX MICROSCOPIC      Result Value Range   Color, Urine YELLOW  YELLOW   APPearance CLEAR  CLEAR   Specific Gravity, Urine 1.006  1.005 - 1.030   pH 6.0  5.0 - 8.0   Glucose, UA NEGATIVE  NEGATIVE mg/dL   Hgb urine dipstick NEGATIVE  NEGATIVE   Bilirubin Urine NEGATIVE  NEGATIVE   Ketones, ur NEGATIVE  NEGATIVE mg/dL   Protein, ur NEGATIVE  NEGATIVE mg/dL   Urobilinogen, UA 0.2  0.0 - 1.0 mg/dL   Nitrite NEGATIVE  NEGATIVE   Leukocytes, UA NEGATIVE  NEGATIVE   Laboratory interpretation all normal     No results found. AP CT pending.   1. Left flank pain     Devoria Albe, MD, FACEP    MDM    Ward Givens, MD 03/15/13 [phone]

## 2013-03-14 NOTE — ED Provider Notes (Signed)
Patient's urinalysis did not show any signs of infection. Her abdominal CT did not show any renal stones. On reexamine the patient she has palpable tenderness primarily along the lumbar spine musculature on the left. I feel her pain is likely musculoskeletal. She has no significant abdominal tenderness. She was given a prescription for Percocet and she also has Flexeril at home to use. She was advised to followup with her primary care physician.  Rolan Bucco, MD 03/14/13 [phone]

## 2013-03-14 NOTE — ED Notes (Signed)
Per patient she thought she had pulled a muscle and took a muscle relaxer with no relief. Patient denies pain with urination and more pain with movement and slight pain when at rest. Patient denies F/V/D and reports nausea with pain.

## 2013-03-17 ENCOUNTER — Ambulatory Visit (INDEPENDENT_AMBULATORY_CARE_PROVIDER_SITE_OTHER): Payer: BC Managed Care – PPO | Admitting: Internal Medicine

## 2013-03-17 ENCOUNTER — Encounter: Payer: Self-pay | Admitting: Internal Medicine

## 2013-03-17 ENCOUNTER — Telehealth: Payer: Self-pay | Admitting: *Deleted

## 2013-03-17 VITALS — BP 117/79 | HR 94 | Temp 99.7°F | Wt 177.0 lb

## 2013-03-17 DIAGNOSIS — B029 Zoster without complications: Secondary | ICD-10-CM

## 2013-03-17 HISTORY — DX: Zoster without complications: B02.9

## 2013-03-17 MED ORDER — VALACYCLOVIR HCL 1 G PO TABS: 1000.0000 mg | ORAL_TABLET | Freq: Three times a day (TID) | ORAL | Status: AC

## 2013-03-17 MED ORDER — OXYCODONE-ACETAMINOPHEN 5-325 MG PO TABS: 2.0000 | ORAL_TABLET | ORAL | Status: DC | PRN

## 2013-03-17 NOTE — Telephone Encounter (Signed)
Pt came to [facility] this AM - has rash on left side of back above waist since 03/16/13 AM - painful, itches and burns. Went to ER over weekend - thought it was pulled muscle. Appt given today  03/17/13 2:45PM with Dr Clyde Lundborg. Stanton Kidney Eathen Budreau RN 03/17/13 11AM

## 2013-03-17 NOTE — Progress Notes (Signed)
Patient ID: Donna Davis, female   DOB: Dec 26, 1970, 42 y.o.   MRN: [id number] Subjective:   Patient ID: Donna Davis female   DOB: [birth year] 42 y.o.   MRN: [id number]  CC: Acute visit. HPI:  [name] is a 42 y.o. lady with past medical history as outlined below, who present for an acute visit today.  Patient reports that she started having pain in the left flank area since 03/13/13. The pain is burning and itchy. The pain is 10 out of 10 in severity and nonradiating. It is aggravated by touching or scratching the skin over that area. It is alleviated by pain medication (Percocet). She visited emergency department on 6/28. She had CT abdomen/pelvis in ED which was negative for kidney stone. She was diagnosed with a muscle straining and was treated with Percocet.   Patient noticed rashes over the same area on 6/30. The rashes were initially blister like, filled with water per patient. The rashes are itchy and burning. Since the pain has not improved, she come to the clinic for acute visit today.   Patient does not have fever or chills. She had an negative HIV Ab test on February of 2014.  Denies fever, chills, fatigue, headaches,  cough, chest pain, SOB,  diarrhea, constipation, dysuria, urgency, frequency, hematuria.   Past Medical History  Diagnosis Date  . GERD (gastroesophageal reflux disease)   . Hypothyroidism   . IBS (irritable bowel syndrome)   . Anxiety   . Hearing difficulty     b/l, 2/2 congenital rubella s/p stapedectomy. Using hearing aid  . Headache(784.0) 04/2005    h/o headache in past that raised question of psuedotumor s/p therapeutic LP with an opening preassure of 18 cm H2O   . Hepatic cyst     6 mm on CT done done in 05/2005  . Family history of malignant neoplasm of gastrointestinal tract    Current Outpatient Prescriptions  Medication Sig Dispense Refill  . acetaminophen (TYLENOL) 500 MG tablet Take 500 mg by mouth every 6 (six) hours as needed for pain.      .  cyclobenzaprine (FLEXERIL) 5 MG tablet Take 5 mg by mouth 3 (three) times daily as needed for muscle spasms.      . FOLIC ACID PO Take 1 tablet by mouth daily. OTC Folic Acid      . ibuprofen (ADVIL,MOTRIN) 200 MG tablet Take 200 mg by mouth every 6 (six) hours as needed for pain.      Marland Kitchen levothyroxine (SYNTHROID, LEVOTHROID) 25 MCG tablet Take 1 tablet (25 mcg total) by mouth daily.  31 tablet  11  . Norethindrone-Ethinyl Estradiol-Fe Biphas (LO LOESTRIN FE) 1 MG-10 MCG / 10 MCG tablet Take 1 tablet by mouth daily.  3 Package  4  . oxyCODONE-acetaminophen (PERCOCET) 5-325 MG per tablet Take 2 tablets by mouth every 4 (four) hours as needed for pain.  40 tablet  0  . SUMAtriptan (IMITREX) 100 MG tablet Take 50-100 mg by mouth See admin instructions. TAKE 1/2 TABLET AT THE ONSET OF HEADACHE. IF NOT IMPROVED AFTER 30 MINUTES TAKE THE SECOND HALF      . topiramate (TOPAMAX) 25 MG tablet Take 1 tablet (25 mg total) by mouth 2 (two) times daily.  60 tablet  3  . valACYclovir (VALTREX) 1000 MG tablet Take 1 tablet (1,000 mg total) by mouth 3 (three) times daily.  21 tablet  0   No current facility-administered medications for this visit.   Family  History  Problem Relation Age of Onset  . Breast cancer Mother   . Ovarian cancer Mother   . Diabetes Mother   . Stomach cancer Maternal Grandmother   . Colon cancer Maternal Grandmother   . Diabetes Maternal Grandmother   . Pancreatic cancer Father   . Prostate cancer Father   . Colon cancer Father   . Diabetes Father   . Heart disease Father   . Irritable bowel syndrome Father   . Kidney disease Father   . Heart disease      both grandmothers  . Bipolar disorder Daughter    History   Social History  . Marital Status: Married    Spouse Name: N/A    Number of Children: 2  . Years of Education: N/A   Occupational History  . student    Social History Main Topics  . Smoking status: Former Smoker    Types: Cigarettes  . Smokeless tobacco:  Not on file     Comment: quit 47yrs ago  . Alcohol Use: No  . Drug Use: No  . Sexually Active: Yes   Other Topics Concern  . Not on file   Social History Narrative   Current smoker, not using alcohol or drugs at this time   Lives with husband and 2 children    Review of Systems: Full 14-point review of systems otherwise negative. See HPI.   Objective:  Physical Exam: Filed Vitals:   03/17/13 1556  BP: 117/79  Pulse: 94  Temp: 99.7 F (37.6 C)  TempSrc: Oral  Weight: 177 lb (80.287 kg)  SpO2: 94%   Constitutional: Vital signs reviewed.  Patient is a well-developed and well-nourished, in no acute distress and cooperative with exam.   HEENT:  Head: Normocephalic and atraumatic Ear: TM normal bilaterally Mouth: no erythema or exudates, MMM Eyes: PERRL, EOMI, conjunctivae normal, No scleral icterus.  Neck: Supple, Trachea midline normal ROM, No JVD, mass, thyromegaly, or carotid bruit present. No lymph node enlargement. Cardiovascular: RRR, S1 normal, S2 normal, no MRG, pulses symmetric and intact bilaterally Pulmonary/Chest: CTAB, no wheezes, rales, or rhonchi Abdominal: Soft. Non-tender, non-distended, bowel sounds are normal, no masses, organomegaly, or guarding present.  GU: no CVA tenderness Musculoskeletal: No joint deformities, erythema, or stiffness, ROM full and non-tender Hematology: no cervical, inginal, or axillary adenopathy.  Neurological: A&O x3, Strength is normal and symmetric bilaterally, cranial nerve II-XII are grossly intact. Skin: There are some ruptured blisters rashes over the left flank area. There is significant tenderness over the skin in left flank area.  Psychiatric: Normal mood and affect. speech and behavior is normal. Judgment and thought content normal. Cognition and memory are normal.   Assessment & Plan:

## 2013-03-17 NOTE — Patient Instructions (Signed)
1. Please take valacyclovir 1000 mg three times a day for 7 days.  2. Please take all medications as prescribed.  3. If you have worsening of your symptoms or new symptoms arise, please call the clinic ([phone]), or go to the ER immediately if symptoms are severe.  Shingles Shingles (herpes zoster) is an infection that is caused by the same virus that causes chickenpox (varicella). The infection causes a painful skin rash and fluid-filled blisters, which eventually break open, crust over, and heal. It may occur in any area of the body, but it usually affects only one side of the body or face. The pain of shingles usually lasts about 1 month. However, some people with shingles may develop long-term (chronic) pain in the affected area of the body. Shingles often occurs many years after the person had chickenpox. It is more common:  In people older than 50 years.  In people with weakened immune systems, such as those with HIV, AIDS, or cancer.  In people taking medicines that weaken the immune system, such as transplant medicines.  In people under great stress. CAUSES  Shingles is caused by the varicella zoster virus (VZV), which also causes chickenpox. After a person is infected with the virus, it can remain in the person's body for years in an inactive state (dormant). To cause shingles, the virus reactivates and breaks out as an infection in a nerve root. The virus can be spread from person to person (contagious) through contact with open blisters of the shingles rash. It will only spread to people who have not had chickenpox. When these people are exposed to the virus, they may develop chickenpox. They will not develop shingles. Once the blisters scab over, the person is no longer contagious and cannot spread the virus to others. SYMPTOMS  Shingles shows up in stages. The initial symptoms may be pain, itching, and tingling in an area of the skin. This pain is usually described as burning,  stabbing, or throbbing.In a few days or weeks, a painful red rash will appear in the area where the pain, itching, and tingling were felt. The rash is usually on one side of the body in a band or belt-like pattern. Then, the rash usually turns into fluid-filled blisters. They will scab over and dry up in approximately 2 3 weeks. Flu-like symptoms may also occur with the initial symptoms, the rash, or the blisters. These may include:  Fever.  Chills.  Headache.  Upset stomach. DIAGNOSIS  Your caregiver will perform a skin exam to diagnose shingles. Skin scrapings or fluid samples may also be taken from the blisters. This sample will be examined under a microscope or sent to a lab for further testing. TREATMENT  There is no specific cure for shingles. Your caregiver will likely prescribe medicines to help you manage the pain, recover faster, and avoid long-term problems. This may include antiviral drugs, anti-inflammatory drugs, and pain medicines. HOME CARE INSTRUCTIONS   Take a cool bath or apply cool compresses to the area of the rash or blisters as directed. This may help with the pain and itching.   Only take over-the-counter or prescription medicines as directed by your caregiver.   Rest as directed by your caregiver.  Keep your rash and blisters clean with mild soap and cool water or as directed by your caregiver.  Do not pick your blisters or scratch your rash. Apply an anti-itch cream or numbing creams to the affected area as directed by your caregiver.  Keep  your shingles rash covered with a loose bandage (dressing).  Avoid skin contact with:  Babies.   Pregnant women.   Children with eczema.   Elderly people with transplants.   People with chronic illnesses, such as leukemia or AIDS.   Wear loose-fitting clothing to help ease the pain of material rubbing against the rash.  Keep all follow-up appointments with your caregiver.If the area involved is on your  face, you may receive a referral for follow-up to a specialist, such as an eye doctor (ophthalmologist) or an ear, nose, and throat (ENT) doctor. Keeping all follow-up appointments will help you avoid eye complications, chronic pain, or disability.  SEEK IMMEDIATE MEDICAL CARE IF:   You have facial pain, pain around the eye area, or loss of feeling on one side of your face.  You have ear pain or ringing in your ear.  You have loss of taste.  Your pain is not relieved with prescribed medicines.   Your redness or swelling spreads.   You have more pain and swelling.  Your condition is worsening or has changed.   You have a feveror persistent symptoms for more than 2 3 days.  You have a fever and your symptoms suddenly get worse. MAKE SURE YOU:  Understand these instructions.  Will watch your condition.  Will get help right away if you are not doing well or get worse. Document Released: 09/03/2005 Document Revised: 05/28/2012 Document Reviewed: 04/17/2012 [facility] Patient Information 2014 [place], [place].

## 2013-03-17 NOTE — Assessment & Plan Note (Signed)
Patient's symptoms and physical examination are consistent with shingles. Currently patient does not have fever or chills. Hemodynamically stable. She is not immunocompromised.  -will treat with valacyclovir 1000 mg tid for 7 days -will treat the patient with Percocet.

## 2013-03-18 NOTE — Progress Notes (Signed)
Case discussed with Dr. Niu at the time of the visit.  We reviewed the resident's history and exam and pertinent patient test results.  I agree with the assessment, diagnosis, and plan of care documented in the resident's note.    

## 2013-04-06 ENCOUNTER — Ambulatory Visit (INDEPENDENT_AMBULATORY_CARE_PROVIDER_SITE_OTHER): Payer: BC Managed Care – PPO | Admitting: Internal Medicine

## 2013-04-06 ENCOUNTER — Encounter: Payer: Self-pay | Admitting: Internal Medicine

## 2013-04-06 VITALS — BP 129/78 | HR 97 | Temp 97.7°F | Ht 61.5 in | Wt 174.6 lb

## 2013-04-06 DIAGNOSIS — B029 Zoster without complications: Secondary | ICD-10-CM

## 2013-04-06 DIAGNOSIS — N643 Galactorrhea not associated with childbirth: Secondary | ICD-10-CM

## 2013-04-06 DIAGNOSIS — G43909 Migraine, unspecified, not intractable, without status migrainosus: Secondary | ICD-10-CM

## 2013-04-06 LAB — CBC WITH DIFFERENTIAL/PLATELET
Basophils Absolute: 0 10*3/uL (ref 0.0–0.1)
Basophils Relative: 0 % (ref 0–1)
Eosinophils Absolute: 0.1 10*3/uL (ref 0.0–0.7)
Eosinophils Relative: 2 % (ref 0–5)
HCT: 42 % (ref 36.0–46.0)
Hemoglobin: 14.5 g/dL (ref 12.0–15.0)
MCH: 31.2 pg (ref 26.0–34.0)
MCHC: 34.5 g/dL (ref 30.0–36.0)
MCV: 90.3 fL (ref 78.0–100.0)
Monocytes Absolute: 0.4 10*3/uL (ref 0.1–1.0)
Monocytes Relative: 5 % (ref 3–12)
RDW: 13.7 % (ref 11.5–15.5)

## 2013-04-06 MED ORDER — MELOXICAM 7.5 MG PO TABS: 7.5000 mg | ORAL_TABLET | Freq: Every day | ORAL | Status: DC

## 2013-04-06 MED ORDER — AMITRIPTYLINE HCL 50 MG PO TABS: 50.0000 mg | ORAL_TABLET | Freq: Every day | ORAL | Status: DC

## 2013-04-06 NOTE — Assessment & Plan Note (Signed)
She will see her gynecologist Thursday to discuss possible changes to medication.  Her work up to date has been non revealing.

## 2013-04-06 NOTE — Assessment & Plan Note (Signed)
Migraines are improved.   Due to the concurrent shingles, will start low dose amitriptyline as well, this could help with migraine prophylaxis as well.

## 2013-04-06 NOTE — Assessment & Plan Note (Addendum)
She has now had recurrent shingles in multiple dermatomes.   Will check a CBC with diff for possible malignancy and HIV viral load and screen due to recent infidelity in her husband.  I have a low suspicion for either of these, as stress itself can trigger shingles.  However, her presentation is abnormal.    For pain control, will attempt amitriptyline.  She does not like narcotics as they make her too sleepy and cannot tolerate tramadol.  Will try mobic for a short course.  Her renal function is okay.

## 2013-04-06 NOTE — Progress Notes (Signed)
Subjective:    Patient ID: Donna Davis, female    DOB: 03/28/1971, 42 y.o.   MRN: [id number]  4 week follow up for headache  HPI  Ms. Metzger is a 42yo woman with deafness who presents for follow up of headaches.  She also has been struggling with galactorrhea, work up has been unrevealing as to the cause.  She is on an OCP.  I suggested at last visit that she discuss with her GYN doctor re: stopping this medication.  She has an appointment this Thursday.  I gave her documentation re: our work up in this clinic to show to those physicians.    Today she reports that her headaches from before are improved.  However, she has a new outbreak of shingles which came up about a week ago.  She was seen in our clinic for shingles on the left flank on 03/17/13.  These improved with valtrex.  However, about a week ago she began having similar, but worse, pain on the right flank which erupted into vesicles.  She was seen in urgent care and diagnosed with shingles there as well.  The vesicles have been improving with valtrex, but the pain is not.  She is concerned about the recurrent shingles.  She has been having tension type headaches due to the pain from the shingles.    She notes that her stress levels at home have improved and her husband agreed to go to counseling.  He is cheating on her however, and she is not sure he will participate fully in counseling.    She has lost weight and I congratulated her on this success.   Her medications are updated in epic.  The "fuzzy headed" feeling from topamax has resolved.   Current Outpatient Prescriptions on File Prior to Visit  Medication Sig Dispense Refill  . acetaminophen (TYLENOL) 500 MG tablet Take 500 mg by mouth every 6 (six) hours as needed for pain.      . cyclobenzaprine (FLEXERIL) 5 MG tablet Take 5 mg by mouth 3 (three) times daily as needed for muscle spasms.      . FOLIC ACID PO Take 1 tablet by mouth daily. OTC Folic Acid      . ibuprofen  (ADVIL,MOTRIN) 200 MG tablet Take 200 mg by mouth every 6 (six) hours as needed for pain.      Marland Kitchen levothyroxine (SYNTHROID, LEVOTHROID) 25 MCG tablet Take 1 tablet (25 mcg total) by mouth daily.  31 tablet  11  . Norethindrone-Ethinyl Estradiol-Fe Biphas (LO LOESTRIN FE) 1 MG-10 MCG / 10 MCG tablet Take 1 tablet by mouth daily.  3 Package  4  . oxyCODONE-acetaminophen (PERCOCET) 5-325 MG per tablet Take 2 tablets by mouth every 4 (four) hours as needed for pain.  40 tablet  0  . SUMAtriptan (IMITREX) 100 MG tablet Take 50-100 mg by mouth See admin instructions. TAKE 1/2 TABLET AT THE ONSET OF HEADACHE. IF NOT IMPROVED AFTER 30 MINUTES TAKE THE SECOND HALF      . topiramate (TOPAMAX) 25 MG tablet Take 1 tablet (25 mg total) by mouth 2 (two) times daily.  60 tablet  3     Review of Systems  Constitutional: Negative for fever and chills.  Respiratory: Negative for cough and shortness of breath.   Cardiovascular: Negative for chest pain and leg swelling.  Gastrointestinal: Negative for abdominal pain, diarrhea and constipation.  Genitourinary: Negative for dysuria and difficulty urinating.  Skin: Positive for rash (shingles).       +  burning shooting pains at site of rash, crusted lesions.   Neurological: Positive for headaches. Negative for dizziness.  Hematological: Does not bruise/bleed easily.  Psychiatric/Behavioral: Negative for confusion and decreased concentration.       Objective:   Physical Exam  Constitutional: She is oriented to person, place, and time. She appears well-developed and well-nourished. No distress.  HENT:  Head: Normocephalic and atraumatic.  Mouth/Throat: No oropharyngeal exudate.  Eyes: Conjunctivae are normal. Pupils are equal, round, and reactive to light. No scleral icterus.  Neck: Neck supple.  Cardiovascular: Normal rate, regular rhythm, normal heart sounds and intact distal pulses.   Pulmonary/Chest: Effort normal and breath sounds normal. No respiratory  distress. She has no wheezes.  Abdominal: Soft. Bowel sounds are normal. She exhibits no distension.  Musculoskeletal: She exhibits no edema and no tenderness.  Lymphadenopathy:    She has no cervical adenopathy.  Neurological: She is alert and oriented to person, place, and time.  Skin: Skin is warm and dry. No rash noted. No erythema.  Psychiatric: She has a normal mood and affect. Her behavior is normal.    Labs: CBC, HIV screen and viral load.       Assessment & Plan:  RTC in 3-4 months.

## 2013-04-06 NOTE — Patient Instructions (Addendum)
Please schedule a follow up visit within the next 3-4 months.  Please contact me earlier if any new information is provided by your gynecologist   For your medications:   Please bring all of your pill  Bottles with you to each visit.  This will help make sure that we have an up to date list of all the medications you are taking.  Please also bring any over the counter herbal medications you are taking (not including advil, tylenol, etc.)  Please continue taking all of your medications as prescribed.   Please start taking amitriptyline 50mg  at bedtime.  This will help with your shingles pain and should help with your migraines.    I am checking some blood work today to look for any reason why your immune system may not be functioning well.  I will email you the results.    Thank you!

## 2013-04-07 ENCOUNTER — Encounter: Payer: Self-pay | Admitting: Internal Medicine

## 2013-04-07 LAB — HIV-1 RNA QUANT-NO REFLEX-BLD: HIV-1 RNA Quant, Log: <1.3 {Log} (ref <1.30)

## 2013-04-08 ENCOUNTER — Encounter: Payer: Self-pay | Admitting: Internal Medicine

## 2013-04-08 ENCOUNTER — Ambulatory Visit (INDEPENDENT_AMBULATORY_CARE_PROVIDER_SITE_OTHER): Payer: BC Managed Care – PPO | Admitting: Internal Medicine

## 2013-04-08 VITALS — BP 121/77 | HR 99 | Temp 98.2°F | Ht 61.5 in | Wt 176.4 lb

## 2013-04-08 DIAGNOSIS — B029 Zoster without complications: Secondary | ICD-10-CM

## 2013-04-08 DIAGNOSIS — R000 Tachycardia, unspecified: Secondary | ICD-10-CM

## 2013-04-08 DIAGNOSIS — T43015A Adverse effect of tricyclic antidepressants, initial encounter: Secondary | ICD-10-CM | POA: Insufficient documentation

## 2013-04-08 DIAGNOSIS — F411 Generalized anxiety disorder: Secondary | ICD-10-CM

## 2013-04-08 DIAGNOSIS — T43205A Adverse effect of unspecified antidepressants, initial encounter: Secondary | ICD-10-CM

## 2013-04-08 HISTORY — DX: Adverse effect of tricyclic antidepressants, initial encounter: T43.015A

## 2013-04-08 MED ORDER — HYDROCODONE-ACETAMINOPHEN 5-325 MG PO TABS: 1.0000 | ORAL_TABLET | Freq: Four times a day (QID) | ORAL | Status: DC | PRN

## 2013-04-08 MED ORDER — VALACYCLOVIR HCL 1 G PO TABS: 1000.0000 mg | ORAL_TABLET | Freq: Three times a day (TID) | ORAL | Status: DC

## 2013-04-08 NOTE — Progress Notes (Signed)
  Subjective:    Patient ID: Donna Davis, female    DOB: Oct 29, 1970, 42 y.o.   MRN: [id number]  HPI Donna Davis is a 42yo woman with deafness and migraines who presents for an acute visit for medication side effects.  On 04/06/13 she was started on low-dose amitriptyline 50mg  daily for pain control during a recurrent shingles infection. 1 hour after taking the medication she developed heart palpitations and severe anxiety. She began hallucinating that her "television was moving around the room and the characters had come to life." She also noted some blurry vision. The experience was very traumatic for her as she was unable to sign at that time, and therefore could not communicate. Her son noted that her words were slurred. Denies skin vasodilation, hyperthermia, urinary retention, changes in her bowel habits, seizures, or headache. Her symptoms lasted for about 24 hours. After the first 50mg  dose, she stopped taking the amitriptyline secondary to these symptoms. Today she feels back to normal except for some mild anxiety. Vital signs are stable with a blood pressure of 121/77 and pulse rate 99. She is afebrile.  She continues to feel pain in the area of her zoster infection which is the right flank. She states that the Mobic has not helped. She has multiple allergies to pain medications including tramadol. However, she notes that Norco has helped her in the past and not made her too drowsy. She is traveling to [place] this weekend to see her daughter graduate from [employer] school and would like to make sure her pain is well controlled without making her sedated. Her husband will be driving.    Review of Systems  Constitutional: Negative for fever and chills.  HENT: Positive for hearing loss.   Eyes: Negative for visual disturbance.  Respiratory: Negative for cough, chest tightness and shortness of breath.   Cardiovascular: Negative for chest pain and palpitations.  Gastrointestinal: Negative for  nausea, abdominal pain, diarrhea and constipation.  Genitourinary: Negative for dysuria and difficulty urinating.  Musculoskeletal: Negative for gait problem.  Skin: Positive for rash.  Neurological: Negative for dizziness, seizures, weakness, light-headedness, numbness and headaches.  Psychiatric/Behavioral: Negative for agitation. The patient is nervous/anxious.        Objective:   Physical Exam  Constitutional: She is oriented to person, place, and time. She appears well-developed and well-nourished. No distress.  Eyes: Pupils are equal, round, and reactive to light.  No mydriasis.  Cardiovascular: Normal rate, regular rhythm and normal heart sounds.   Pulmonary/Chest: Effort normal and breath sounds normal.  Musculoskeletal: Normal range of motion.  Neurological: She is alert and oriented to person, place, and time. She has normal strength and normal reflexes. She displays normal reflexes. No cranial nerve deficit or sensory deficit. She displays a negative Romberg sign. Coordination and gait normal.  Normal neurologic exam.  Skin: Bruising and rash (right flank) noted. Rash is vesicular.     Distribution drawn. Appears to be dermatomal; it is somewhat wide and could represent involvement of 2 dermatomes. It is tender to the touch. Some bruising is also noted in the region.  Psychiatric: She has a normal mood and affect.          Assessment & Plan:

## 2013-04-08 NOTE — Assessment & Plan Note (Addendum)
This is her second episode of shingles in the past month, on the opposite side of her body, which is highly unusual. HIV test negative. CBC with differential did not suggest malignancy. - Urgent referral to dermatology for evaluation and biopsy prior to resolution of the rash. Her appointment is with [facility] dermatology on July 25 at 3 PM. Sign language interpreter was arranged. Will followup their recommendations. - Norco 5 mg prescribed for pain control. Patient was instructed to cut the pill in half if this dose is too sedating. - After discussion with Dr. Orvan Falconer of infectious disease, we have prescribed her an additional week of Valtrex. - Followup in one to 2 weeks.

## 2013-04-08 NOTE — Assessment & Plan Note (Addendum)
Her described symptoms including tachycardia, anxiety, hallucinations, and visual disturbance likely represent a reaction to the anticholinergic effects of amitriptyline. Onset was within 1 hour of starting the medication, and complete symptomatic resolution occurred 24 hours later. Also on the differential is herpes zoster encephalitis. However, her normal neurologic exam, lack of headache or seizures, and the close relationship of her symptoms to taking amitriptyline make this diagnosis less likely. - I have discontinued amitriptyline and added it to her allergy list. - Patient instructed to please call the office if her symptoms return, including hallucinations, anxiety, bad headache, or any changes in mental status as this could represent encephalitis.

## 2013-04-08 NOTE — Patient Instructions (Addendum)
Thank you for your visit - We believe your symptoms were the result of a drug reaction. I have added amitriptyline to your list of drug allergies. - I have prescribed Vicodin 5 mg for your pain control instead. You may take half a pill if this dose is too sedating. - Please call the office if your symptoms return, including hallucinations, anxiety, bad headache, and any changes in your mental status. Very rarely the shingles virus can cause an infection in your brain, and we want to be vigilant for this. - It is unusual for a person to get 2 episodes of shingles back to back, and in different locations. For this reason we would like you to see a dermatologist as soon as possible for evaluation and a skin biopsy. Please go to your appointment on July 25 at 3 PM. Since the doctor may take a biopsy, and this can be somewhat painful, you may want to take some of your pain medication prior to the visit, as long as you have a ride. - I have prescribed another week of Valtrex so that you have extra antiviral coverage. This was suggested by the infectious disease doctor we talked to on the phone. - Please followup with [place] in clinic in one to 2 weeks after you return from [place].

## 2013-04-09 ENCOUNTER — Encounter: Payer: Self-pay | Admitting: Family Medicine

## 2013-04-09 ENCOUNTER — Ambulatory Visit (INDEPENDENT_AMBULATORY_CARE_PROVIDER_SITE_OTHER): Payer: BC Managed Care – PPO | Admitting: Family Medicine

## 2013-04-09 ENCOUNTER — Telehealth: Payer: Self-pay | Admitting: Internal Medicine

## 2013-04-09 ENCOUNTER — Encounter: Payer: Self-pay | Admitting: Internal Medicine

## 2013-04-09 ENCOUNTER — Encounter: Payer: Self-pay | Admitting: *Deleted

## 2013-04-09 VITALS — BP 111/82 | HR 85 | Temp 99.0°F | Ht 60.5 in | Wt 172.6 lb

## 2013-04-09 DIAGNOSIS — N643 Galactorrhea not associated with childbirth: Secondary | ICD-10-CM

## 2013-04-09 MED ORDER — ESTRADIOL 1 MG PO TABS: 1.0000 mg | ORAL_TABLET | Freq: Every day | ORAL | Status: DC

## 2013-04-09 NOTE — Progress Notes (Signed)
Patient ID: Donna Davis, female   DOB: 1971-04-12, 42 y.o.   MRN: [id number]  S: 42 y.o. [id number] non-pregnant female referred from PCP for milky discharge from breasts x 8 months. Bilateral. Fluid is white and smells like breast milk per patient. Sometimes notes wetness and staining of bra. Can express milk and sometimes noted coming from nipples without stimulation. Random, no apparent stimulus.  Last delivery was 19 years ago, and she breastfed her daughter. She is s/p total hysterectomy about 9 years ago. Had one ovary removed 11 years ago and was told the other ovary was not functioning 5 years ago. She has hypothyroidism for past 12 years, on Synthroid for about 5 years, 25 mcg. Has been on LoLoestrin for several years, started by Dr. Okey Dupre, for perimenopausal symptoms - hot flashes, sweats, mood swings. Tried a different pill initially but it stopped working and she switched to current OCP. Now she is wondering if the LoLoestrin may be causing her galactorrhea.  She also was having severe daily headaches. Tried Amitripyline but couldn't tolerate. Now on Topamax for past few months and seems to be working. Has had MRI/MRA of brain with no signs of tumor or lesion. No vision changes. LMP 8 or 9 years ago. No vaginal discharge or abdominal pain.   Her mood and hot flashes are currently very well controlled. She is starting school in 2 weeks and does not want to stop HRT altogether - is worried about the effect of any change on her ability to concentrated on school work.  Past Medical History  Diagnosis Date  . GERD (gastroesophageal reflux disease)   . Hypothyroidism   . IBS (irritable bowel syndrome)   . Anxiety   . Hearing difficulty     b/l, 2/2 congenital rubella s/p stapedectomy. Using hearing aid  . Headache(784.0) 04/2005    h/o headache in past that raised question of psuedotumor s/p therapeutic LP with an opening preassure of 18 cm H2O   . Hepatic cyst     6 mm on CT done done in 05/2005   . Family history of malignant neoplasm of gastrointestinal tract   . Shingles 03/2013   Past Surgical History  Procedure Laterality Date  . Abdominal hysterectomy      partial hysterecotmy 2003 for endoemtriosis with residula right ovaey. Multiple GYN surgeries for chronic pelvic pain  . Cesarean section  1994  . Stapedectomy  2007    Dr. Ezzard Standing, left ear  . Laparoscopic ovarian cystectomy  2001  . Thyroid nodule removed  1997  . Total abdominal hysterectomy w/ bilateral salpingoophorectomy      left  . Abdominal adhesion surgery      removal from bowel  . Joint replacement     SOCIAL:  Does not smoke  Family History  Problem Relation Age of Onset  . Breast cancer Mother   . Ovarian cancer Mother   . Diabetes Mother   . Stomach cancer Maternal Grandmother   . Colon cancer Maternal Grandmother   . Diabetes Maternal Grandmother   . Pancreatic cancer Father   . Prostate cancer Father   . Colon cancer Father   . Diabetes Father   . Heart disease Father   . Irritable bowel syndrome Father   . Kidney disease Father   . Heart disease      both grandmothers  . Bipolar disorder Daughter    Review of Systems  Constitutional: Positive for weight loss (25 pounds since starting Topamax). Negative for  fever and chills.  Eyes: Negative for blurred vision and double vision.  Respiratory: Negative for shortness of breath.   Cardiovascular: Negative for chest pain and palpitations.  Gastrointestinal: Negative for nausea, vomiting, abdominal pain, diarrhea and constipation.  Genitourinary: Negative for dysuria.  Neurological: Negative for dizziness, focal weakness and weakness.    O Filed Vitals:   04/09/13 1335  BP: 111/82  Pulse: 85  Temp: 99 F (37.2 C)   GEN:  WNWD, no distress HEENT:  NCAT, EOMI, conjunctiva clear BREASTS:  No nipple deformities or skin changes. No lumps or masses. One or two drops of milky white fluid expressed from nipple with pressure on each  side. CV: RRR, no murmur RESP:  CTAB ABD:  Soft, non-tender, no guarding or rebound, normal bowel sounds EXTREM:  Warm, well perfused, no edema or tenderness NEURO:  Alert, oriented, no focal deficits GU:  Deferred  RESULTS Prolactin level:  10.5 TSH: 1.390 FSH:  28.3  Mammogram: normal Breast Ultrasound:  Normal MRI/MRA brain: Normal except for partially empty sella  A/P 42 y.o. [id number] with galactorrhea - Normal prolactin, TSH, FSH, MRI head, mammogram and breast ultrasound - Minimal leakage - Change from combined OCP to estradiol 1 mg daily. No uterus/endometrium so unopposed estrogen not a problem. - Pt advised to call if she does not tolerate the change in hormones and we will either adjust the dose of restart her LoLoestrin.  - F/U in 3 months otherwise. Can consider other treatment for mood swings and hot flashes if needed

## 2013-04-09 NOTE — Patient Instructions (Addendum)
Hormonal Therapy for Women, Frequently Asked Questions WHAT IS HORMONE THERAPY? Hormone therapy (HT), estrogen and progesterone, provides women with the female hormones that decrease and are lost as women get older. When the hormone estrogen is given alone, it is usually referred to as "ERT." When the hormone progesterone is combined with estrogen, it is generally called "HT." Previously this was known as hormone replacement therapy (HRT). Estrogen is a female hormone that brings about changes in various organs in the body. Progesterone is a female hormone that prepares the uterus for a pregnancy each month. During the change-over to menopause ("perimenopause") these hormone levels start to decrease. This causes many uncomfortable symptoms (see below). When the ovaries stop producing estrogen and progesterone, menstrual periods come to an end. At this point, the woman has experienced menopause. Menopause is complete when a woman misses 12 consecutive menstrual periods. WHAT ARE THE BENEFITS OF HORMONE THERAPY? Hormone therapy has been used to relieve the short-term symptoms of menopause. These include:  Hot flashes.  Depression.  Memory loss.  Correcting irregular menstrual periods.  Night sweats.  Tiredness.  Mood disturbances.  Thinning of scalp hair.  Disturbed sleep.  Vaginal dryness.  Painful intercourse.  Loss of breast tissue. Evidence shows that HT may be helpful in preventing colon cancer and bone loss (osteoporosis). WHAT ARE THE SHORT-TERM RISKS OF HORMONE THERAPY?  Some women report side effects from taking Hormone Therapy, including:  Feeling sick to stomach (nausea).  Fluid retention.  Swollen breasts.  Acne, when taking HT with progesterone.  Unusual vaginal discharge and bleeding (if the uterus is present).  Headaches.  Some women think HT will make them gain weight. Research now shows this is not true. Some women do gain weight during menopause, but this  is because their metabolism slows down as they age. They also may not be increasing their amount or level of physical activity as they get older.  Short-term benefits or side effects should become noticeable within days, weeks, or sometimes months after treatment begins. LONG-TERM RISKS These will not be easily noticeable for each individual woman. There are many factors involved that can contribute to long-term risks and side effects. CANCER There is concern that HT can increase the risk of some cancers, including endometrial cancer (lining of the uterus), breast, and certain (but not all) ovarian or cervix cancers, such as endometriod ovarian cancer.  When estrogen is taken alone, it raises the risk of endometrial cancer, if the uterus is still present. Adding progestin with estrogen (HT) can greatly reduce this risk. Progestin is added to prevent the overgrowth (hyperplasia) of cells in the uterine lining. Women who still have an intact uterus are generally given this combined therapy and should not take estrogen hormone alone without progesterone. HT with estrogen and progestin has been linked to an increased risk of invasive breast cancer. Women who use estrogen plus progestin for four years or longer are more likely to develop breast cancer than women who have not used them for as long. This indicates that the therapy may have a cumulative effect. The decision to take HT should be based on an overall look at the risk and benefits, and how they fit with your personal and genetic health profile. Conditions that increase the underlying risk of developing breast cancer include:  Family history of breast cancer.  Early age of the first menstrual period (menarche).  Late age of child bearing.  High fat diet.  Late menopause.  Obesity.  Increased breast density   on mammograms.  Certain non-cancerous (benign) breast lesions.  Excessive use of alcohol.  Extensive radiation exposure to the  chest. These factors need to be considered when deciding to take HT. If you are currently taking HT and have concerns, talk with your caregiver as soon as possible.  BREAST DENSITY Taking both estrogen and progestin also can affect a woman's breast density. Increased breast density from HT makes it hard for a radiologist to read some special breast x-rays (mammograms). This leads to the need for follow-up mammograms, ultrasound or MRI (magnetic resonance imaging), or taking breast tissue samples that are surgically removed (biopsies). Increased density also is a concern because studies have shown that women age 45 and older, whose mammograms show at least 75 percent dense tissue, are at increased risk for breast cancer. However, it is not known if increased breast density due to HT carries the same risk for breast cancer as having naturally dense breasts. About 25 percent of women who use combined HT have an increase in breast density on their mammograms. This is compared to about 8 percent of women taking estrogen alone. One study showed that stopping HT for about 2 weeks before having a mammogram improved the readability of the mammogram. But further research is needed to confirm the usefulness of this approach. HEART DISEASE In the past, taking HT (estrogen plus progestin) was thought to help protect women against heart disease. However, recent findings show that taking HT poses more risks than benefits. HT could increase a woman's risk for:  Heart disease.  Stroke.  Blood clot in the lung (pulmonary embolism).  Breast cancer.  Blood clots in the legs. Women who have gone through menopause should not be given HT to prevent heart disease and other chronic conditions.  Women who have gone through menopause and who have heart disease, may have a greater risk of another cardiac event (like heart attack) after starting HT, at least in the short-term. For women who have had strokes, their risk for  having another stroke goes up when they start taking HT. Hormones are not recommended for women with heart disease or for women who have had a stroke. If you have gone through menopause, talk with your caregiver about whether hormones are right for you. You can check the National Women's Health Information Center website (www.womenshealth.gov) for updates on postmenopausal hormone therapy. OTHER RISKS INCLUDE:  Developing high blood pressure.  Developing gallbladder disease.  Women with a fibroid non-cancerous tumor on the uterus may develop pain, bleeding or increase growth of the fibroid. If you are taking HT, watch for signs of trouble. These include:  Abnormal bleeding.  Breast lumps, bloody discharge or red/painful breasts.  Shortness of breath.  Dizziness.  Abdominal pain.  Severe headaches.  Pain in your calves or chest. Report these signs to your caregiver right away. Also, talk with your caregiver about how often you should have an exam. DOES THE DURATION OF TAKING HT AFFECT BREAST CANCER RISK? The relationship between a woman's risk of developing breast cancer and the length of time that she receives HT is not clear. Some women take HT for only a few years until the worst of their menopausal symptoms have passed. Others have taken it for 10 years or more. Some researchers believe that there is little or no increased risk of breast cancer associated with short-term use of either HT with estrogen alone or estrogen combined with progestin. But long-term use is linked to an increased risk. Women on   HT should continue to do monthly self breast exams and get their mammograms as recommended by their caregiver. WHY IS MENOPAUSAL HORMONE THERAPY USED IN SPITE OF THE CANCER RISK? The known benefits of HT can improve the quality of life for many women, by reducing uncomfortable symptoms, as mentioned above. There also is evidence that HT helps prevent and treats osteoporosis. There is  preliminary evidence that it can help prevent other problems associated with age, including colon cancer. The addition of progestin to the treatment has greatly reduced the risk of uterine cancer. ARE THERE OTHER DRUG THERAPIES KNOWN TO TREAT CONDITIONS RELATED TO MENOPAUSE? A class of antidepressant drugs called Selective Serotonin Reuptake Inhibitors (SSRIs) are effective in treating menopause-related symptoms of depression or mood changes. Vitamin E and Clonidine (drug typically used for high blood pressure) can help reduce hot flashes. To prevent osteoporosis, women who are at high risk for bone loss may be given drugs such as bisphosphonates, alendronate, raloxifene, calcium with vitamin D, calcitonin, and prescription medicines such as fasomax or boneva. Lastly, a class of cholesterol-lowering drugs called HMG-CoA-reductase inhibitors (statins) are proven to be effective for reducing risk of heart disease. They are also being explored to prevent osteoporosis. No alternatives to estrogen exist for prevention of colon cancer  a disease for which early evidence suggests HT may be beneficial. WHO SHOULD NOT USE HT?  HT is often not recommended for women who have any of the following conditions:  Vaginal bleeding of an unknown cause.  Suspected breast cancer or history of breast cancer.  History of endometrial or uterine cancer.  Chronic disease of the liver.  History of heart disease.  History of blood clots in the veins or legs or in the lung (venous thrombosis). This includes women who have had thrombosis or blood clots during pregnancy or when taking birth control pills. Although the risk of blood clots in women is very low, HT increases the risk.  Severe or uncontrolled high blood pressure.  Anyone who may be pregnant. HOW CAN I SORT THROUGH THE BENEFITS AND RISKS TO MAKE A GOOD DECISION ABOUT WHETHER OR NOT TO USE POSTMENOPAUSAL HORMONE THERAPY? Here are several helpful points,  summarizing the findings of the Women's Health Initiative (WHI) study:  First, it is important to know that because the study involved healthy women, only a small number of them had either a negative or positive effect from estrogen plus progestin therapy. The percentages describe what would happen to a whole population, not necessarily to any individual woman. Second, remember that percentages are not fate. Whether expressing risks or benefits, a percentage does not mean you will develop a disease. Many factors affect that likelihood, including:  Your lifestyle.  Environmental factors.  Heredity.  Your personal medical history. Realize that most treatments carry risks and benefits. No one can make a treatment choice for you. Talk with your caregiver and decide what is best for your health and quality of life. Begin by finding out your family history and your personal risk profile for:  Heart disease.  Stroke.  Breast cancer.  Osteoporosis.  Colorectal cancer.  Blood clots.  Other medical conditions. Document Released: 06/02/2003 Document Revised: 11/26/2011 Document Reviewed: 07/04/2009 [employer] Patient Information 2014 [employer].  

## 2013-04-09 NOTE — Telephone Encounter (Signed)
Patient is scheduled with [facility] dermatology on Friday, July 25 at 3 PM. When I called the office to sign out the patient, they noted that they could not arrange an ASL interpreter for her at the last minute because their office manager was on vacation. I communicated with the patient via text message about the issue. She is bringing her husband who can help interpret, and she will supplement with speech reading and written communication. [facility] dermatology office is aware.

## 2013-04-10 ENCOUNTER — Other Ambulatory Visit: Payer: Self-pay | Admitting: Surgery

## 2013-04-10 NOTE — Progress Notes (Signed)
I saw patient and discussed her care with Dr. Cater at the time of the visit.  We reviewed the resident's history and exam and pertinent patient test results.  I agree with the assessment, diagnosis, and plan of care documented in the resident's note. 

## 2013-04-22 ENCOUNTER — Telehealth: Payer: Self-pay | Admitting: *Deleted

## 2013-04-22 NOTE — Telephone Encounter (Signed)
Pt called and left detailed message stating that she has tried the estradiol and it is not working for her as well as is making her feel bad. She reports feeling dizzy, light- headed and is emotional. She has stopped taking the estradiol and has gone back to [employer]. She also requested that the doctor be informed of her change in medication regimen. I called pt and left message that she may continue the LoLoestrin as this was an option that Dr. Thad Ranger had discussed in the event that the estradiol was not effective. I also sent message to Dr. Macon Large for review.

## 2013-04-25 NOTE — Telephone Encounter (Signed)
Patient may restart her LoLoestrin for now, she can also come in for discussion of other agents to manage her vasomotor symptoms.  Jaynie Collins, MD, FACOG Attending Obstetrician & Gynecologist Faculty Practice, [facility] [place]

## 2013-05-04 ENCOUNTER — Encounter: Payer: Self-pay | Admitting: Internal Medicine

## 2013-05-14 ENCOUNTER — Emergency Department (HOSPITAL_COMMUNITY)
Admission: EM | Admit: 2013-05-14 | Discharge: 2013-05-14 | Disposition: A | Discharge status: Home / Self Care | Payer: BC Managed Care – PPO | Attending: Emergency Medicine | Admitting: Emergency Medicine

## 2013-05-14 ENCOUNTER — Encounter (HOSPITAL_COMMUNITY): Payer: Self-pay | Admitting: *Deleted

## 2013-05-14 ENCOUNTER — Emergency Department (HOSPITAL_COMMUNITY): Payer: BC Managed Care – PPO

## 2013-05-14 DIAGNOSIS — IMO0002 Reserved for concepts with insufficient information to code with codable children: Secondary | ICD-10-CM | POA: Insufficient documentation

## 2013-05-14 DIAGNOSIS — Y9389 Activity, other specified: Secondary | ICD-10-CM | POA: Insufficient documentation

## 2013-05-14 DIAGNOSIS — M549 Dorsalgia, unspecified: Secondary | ICD-10-CM

## 2013-05-14 DIAGNOSIS — Z79899 Other long term (current) drug therapy: Secondary | ICD-10-CM | POA: Insufficient documentation

## 2013-05-14 DIAGNOSIS — Z791 Long term (current) use of non-steroidal anti-inflammatories (NSAID): Secondary | ICD-10-CM | POA: Insufficient documentation

## 2013-05-14 DIAGNOSIS — Y92410 Unspecified street and highway as the place of occurrence of the external cause: Secondary | ICD-10-CM | POA: Insufficient documentation

## 2013-05-14 DIAGNOSIS — E039 Hypothyroidism, unspecified: Secondary | ICD-10-CM | POA: Insufficient documentation

## 2013-05-14 DIAGNOSIS — R0789 Other chest pain: Secondary | ICD-10-CM

## 2013-05-14 DIAGNOSIS — Z87891 Personal history of nicotine dependence: Secondary | ICD-10-CM | POA: Insufficient documentation

## 2013-05-14 DIAGNOSIS — Z8659 Personal history of other mental and behavioral disorders: Secondary | ICD-10-CM | POA: Insufficient documentation

## 2013-05-14 DIAGNOSIS — Z8619 Personal history of other infectious and parasitic diseases: Secondary | ICD-10-CM | POA: Insufficient documentation

## 2013-05-14 DIAGNOSIS — Z8719 Personal history of other diseases of the digestive system: Secondary | ICD-10-CM | POA: Insufficient documentation

## 2013-05-14 DIAGNOSIS — S298XXA Other specified injuries of thorax, initial encounter: Secondary | ICD-10-CM | POA: Insufficient documentation

## 2013-05-14 DIAGNOSIS — Z8669 Personal history of other diseases of the nervous system and sense organs: Secondary | ICD-10-CM | POA: Insufficient documentation

## 2013-05-14 MED ORDER — IBUPROFEN 800 MG PO TABS: 800.0000 mg | ORAL_TABLET | Freq: Three times a day (TID) | ORAL | Status: DC | PRN

## 2013-05-14 MED ORDER — DIAZEPAM 5 MG PO TABS: 5.0000 mg | ORAL_TABLET | Freq: Three times a day (TID) | ORAL | Status: DC | PRN

## 2013-05-14 MED ORDER — HYDROCODONE-ACETAMINOPHEN 5-325 MG PO TABS: 1.0000 | ORAL_TABLET | Freq: Four times a day (QID) | ORAL | Status: DC | PRN

## 2013-05-14 NOTE — ED Notes (Signed)
Interpreter at bedside.

## 2013-05-14 NOTE — ED Notes (Signed)
Sign language interpreter called for
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
## 2013-05-14 NOTE — ED Notes (Signed)
Had [place], [place], contact hearing impaired interpreter.

## 2013-05-18 NOTE — ED Provider Notes (Signed)
Medical screening examination/treatment/procedure(s) were performed by non-physician practitioner and as supervising physician I was immediately available for consultation/collaboration.  Toy Baker, MD 05/18/13 2100

## 2013-05-22 ENCOUNTER — Encounter: Payer: Self-pay | Admitting: Internal Medicine

## 2013-06-12 ENCOUNTER — Encounter (HOSPITAL_COMMUNITY): Payer: Self-pay | Admitting: Adult Health

## 2013-06-12 ENCOUNTER — Emergency Department (HOSPITAL_COMMUNITY): Payer: BC Managed Care – PPO

## 2013-06-12 DIAGNOSIS — Z87891 Personal history of nicotine dependence: Secondary | ICD-10-CM | POA: Insufficient documentation

## 2013-06-12 DIAGNOSIS — R110 Nausea: Secondary | ICD-10-CM | POA: Insufficient documentation

## 2013-06-12 DIAGNOSIS — Z79899 Other long term (current) drug therapy: Secondary | ICD-10-CM | POA: Insufficient documentation

## 2013-06-12 DIAGNOSIS — R109 Unspecified abdominal pain: Secondary | ICD-10-CM | POA: Insufficient documentation

## 2013-06-12 DIAGNOSIS — Z8719 Personal history of other diseases of the digestive system: Secondary | ICD-10-CM | POA: Insufficient documentation

## 2013-06-12 DIAGNOSIS — Z8619 Personal history of other infectious and parasitic diseases: Secondary | ICD-10-CM | POA: Insufficient documentation

## 2013-06-12 DIAGNOSIS — E039 Hypothyroidism, unspecified: Secondary | ICD-10-CM | POA: Insufficient documentation

## 2013-06-12 DIAGNOSIS — K8020 Calculus of gallbladder without cholecystitis without obstruction: Secondary | ICD-10-CM | POA: Insufficient documentation

## 2013-06-12 DIAGNOSIS — Z8669 Personal history of other diseases of the nervous system and sense organs: Secondary | ICD-10-CM | POA: Insufficient documentation

## 2013-06-12 DIAGNOSIS — Z8659 Personal history of other mental and behavioral disorders: Secondary | ICD-10-CM | POA: Insufficient documentation

## 2013-06-12 LAB — BASIC METABOLIC PANEL
Calcium: 9.2 mg/dL (ref 8.4–10.5)
Creatinine, Ser: 0.74 mg/dL (ref 0.50–1.10)
GFR calc Af Amer: >90 mL/min (ref >90)
GFR calc non Af Amer: >90 mL/min (ref >90)

## 2013-06-12 LAB — CBC
MCH: 32.5 pg (ref 26.0–34.0)
MCHC: 35 g/dL (ref 30.0–36.0)
MCV: 92.9 fL (ref 78.0–100.0)
Platelets: 289 10*3/uL (ref 150–400)
RDW: 12.3 % (ref 11.5–15.5)

## 2013-06-12 LAB — POCT I-STAT TROPONIN I: Troponin i, poc: 0 ng/mL (ref 0.00–0.08)

## 2013-06-12 NOTE — ED Notes (Addendum)
Presents with sharp bilateral constant chest pain began at 21:20 tonight associated with dizziness, SOB, nausea. Pain radiates into right jaw and back. Nothing makes pain better and deep breathing makes pain worse. Pain rated 10/10. Pt is deaf.

## 2013-06-13 ENCOUNTER — Emergency Department (HOSPITAL_COMMUNITY): Payer: BC Managed Care – PPO

## 2013-06-13 ENCOUNTER — Emergency Department (HOSPITAL_COMMUNITY)
Admission: EM | Admit: 2013-06-13 | Discharge: 2013-06-13 | Disposition: A | Discharge status: Home / Self Care | Payer: BC Managed Care – PPO | Attending: Emergency Medicine | Admitting: Emergency Medicine

## 2013-06-13 DIAGNOSIS — K8050 Calculus of bile duct without cholangitis or cholecystitis without obstruction: Secondary | ICD-10-CM

## 2013-06-13 LAB — HEPATIC FUNCTION PANEL
ALT: 16 U/L (ref 0–35)
Alkaline Phosphatase: 54 U/L (ref 39–117)
Total Protein: 7.2 g/dL (ref 6.0–8.3)

## 2013-06-13 LAB — URINALYSIS, ROUTINE W REFLEX MICROSCOPIC
Bilirubin Urine: NEGATIVE
Hgb urine dipstick: NEGATIVE
Specific Gravity, Urine: 1.009 (ref 1.005–1.030)
Urobilinogen, UA: 1 mg/dL (ref 0.0–1.0)

## 2013-06-13 MED ORDER — IOHEXOL 350 MG/ML SOLN
80.0000 mL | Freq: Once | INTRAVENOUS | Status: AC | PRN
  Administered 2013-06-13: 80 mL via INTRAVENOUS

## 2013-06-13 MED ORDER — HYDROMORPHONE HCL PF 1 MG/ML IJ SOLN
1.0000 mg | Freq: Once | INTRAMUSCULAR | Status: AC
  Administered 2013-06-13: 1 mg via INTRAVENOUS
  Filled 2013-06-13: qty 1

## 2013-06-13 MED ORDER — METOCLOPRAMIDE HCL 10 MG PO TABS: 10.0000 mg | ORAL_TABLET | Freq: Four times a day (QID) | ORAL | Status: DC | PRN

## 2013-06-13 MED ORDER — ONDANSETRON HCL 4 MG/2ML IJ SOLN
4.0000 mg | Freq: Once | INTRAMUSCULAR | Status: AC
  Administered 2013-06-13: 4 mg via INTRAVENOUS
  Filled 2013-06-13: qty 2

## 2013-06-13 MED ORDER — SODIUM CHLORIDE 0.9 % IV BOLUS (SEPSIS)
1000.0000 mL | Freq: Once | INTRAVENOUS | Status: AC
  Administered 2013-06-13: 1000 mL via INTRAVENOUS

## 2013-06-13 MED ORDER — OXYCODONE-ACETAMINOPHEN 5-325 MG PO TABS: 2.0000 | ORAL_TABLET | ORAL | Status: DC | PRN

## 2013-06-13 NOTE — ED Provider Notes (Signed)
CSN: [id number]     Arrival date & time 06/12/13  2214 History   First MD Initiated Contact with Patient 06/13/13 0150     Chief Complaint  Patient presents with  . Chest Pain   (Consider location/radiation/quality/duration/timing/severity/associated sxs/prior Treatment) The history is provided by the patient. A language interpreter was used (sign language interpreter).  [name] is a 42 y.o. female hearing-impaired, reflux, anxiety here presenting with chest pain and abdominal pain. Epigastric pain started around 9:30 PM yesterday. It radiates to the right upper quadrant and also to bilateral breast. Associated with some nausea and diaphoresis. Denies any urinary symptoms. She said his worse when she takes a deep breath as well. Denies any fevers or chills.   Past Medical History  Diagnosis Date  . GERD (gastroesophageal reflux disease)   . Hypothyroidism   . IBS (irritable bowel syndrome)   . Anxiety   . Hearing difficulty     b/l, 2/2 congenital rubella s/p stapedectomy. Using hearing aid  . Headache(784.0) 04/2005    h/o headache in past that raised question of psuedotumor s/p therapeutic LP with an opening preassure of 18 cm H2O   . Hepatic cyst     6 mm on CT done done in 05/2005  . Family history of malignant neoplasm of gastrointestinal tract   . Shingles 03/2013   Past Surgical History  Procedure Laterality Date  . Abdominal hysterectomy      partial hysterecotmy 2003 for endoemtriosis with residula right ovaey. Multiple GYN surgeries for chronic pelvic pain  . Cesarean section  1994  . Stapedectomy  2007    Dr. Ezzard Standing, left ear  . Laparoscopic ovarian cystectomy  2001  . Thyroid nodule removed  1997  . Total abdominal hysterectomy w/ bilateral salpingoophorectomy      left  . Abdominal adhesion surgery      removal from bowel  . Joint replacement     Family History  Problem Relation Age of Onset  . Breast cancer Mother   . Ovarian cancer Mother   . Diabetes  Mother   . Stomach cancer Maternal Grandmother   . Colon cancer Maternal Grandmother   . Diabetes Maternal Grandmother   . Pancreatic cancer Father   . Prostate cancer Father   . Colon cancer Father   . Diabetes Father   . Heart disease Father   . Irritable bowel syndrome Father   . Kidney disease Father   . Heart disease      both grandmothers  . Bipolar disorder Daughter    History  Substance Use Topics  . Smoking status: Former Smoker    Types: Cigarettes  . Smokeless tobacco: Never Used     Comment: quit 67yrs ago  . Alcohol Use: No   OB History   Grav Para Term Preterm Abortions TAB SAB Ect Mult Living   3 2 2  1 1    2      Review of Systems  Cardiovascular: Positive for chest pain.  Gastrointestinal: Positive for nausea and abdominal pain.  All other systems reviewed and are negative.    Allergies  Onion; Shellfish allergy; Amitriptyline; Amoxicillin-pot clavulanate; Cefuroxime; Cephalexin; Ciprofloxacin; Clarithromycin; Clarithromycin; Doxycycline; Other; Prednisone; Propranolol; Shrimp flavor; Sulfonamide derivatives; and Tramadol hcl  Home Medications   Current Outpatient Rx  Name  Route  Sig  Dispense  Refill  . Ascorbic Acid (VITAMIN C PO)   Oral   Take 1 tablet by mouth 2 (two) times daily.         Marland Kitchen  levothyroxine (SYNTHROID, LEVOTHROID) 25 MCG tablet   Oral   Take 1 tablet (25 mcg total) by mouth daily.   31 tablet   11   . Norethindrone-Ethinyl Estradiol-Fe Biphas (LO LOESTRIN FE) 1 MG-10 MCG / 10 MCG tablet   Oral   Take 1 tablet by mouth daily.   3 Package   4     Rx faxed to [facility] per pt requ ...    BP 104/79  Pulse 82  Temp(Src) 98.3 F (36.8 C) (Oral)  Resp 12  Wt 172 lb 6 oz (78.189 kg)  BMI 33.1 kg/m2  SpO2 99%  LMP 12/26/2012 Physical Exam  Nursing note and vitals reviewed. Constitutional: She is oriented to person, place, and time. She appears well-developed.  Uncomfortable   HENT:  Head:  Normocephalic.  Mouth/Throat: Oropharynx is clear and moist.  Eyes: Conjunctivae are normal. Pupils are equal, round, and reactive to light.  Neck: Normal range of motion. Neck supple.  Cardiovascular: Normal rate, regular rhythm and normal heart sounds.   Pulmonary/Chest: Effort normal and breath sounds normal. No respiratory distress. She has no wheezes. She has no rales. She exhibits no tenderness.  Abdominal: Soft.  + RUQ tenderness, ? Eulah Pont. Mild R CVAT. + epigastric tenderness as well.   Musculoskeletal: Normal range of motion. She exhibits no edema and no tenderness.  Neurological: She is alert and oriented to person, place, and time.  Skin: Skin is warm and dry.  Psychiatric: She has a normal mood and affect. Her behavior is normal. Judgment and thought content normal.    ED Course  Procedures (including critical care time) Labs Review Labs Reviewed  D-DIMER, QUANTITATIVE - Abnormal; Notable for the following:    D-Dimer, Quant 1.50 (*)    All other components within normal limits  LIPASE, BLOOD - Abnormal; Notable for the following:    Lipase 80 (*)    All other components within normal limits  CBC  BASIC METABOLIC PANEL  URINALYSIS, ROUTINE W REFLEX MICROSCOPIC  HEPATIC FUNCTION PANEL  POCT I-STAT TROPONIN I   Imaging Review Dg Chest 2 View  06/12/2013   CLINICAL DATA:  Chest and back pain. Ex-smoker.  EXAM: CHEST  2 VIEW  COMPARISON:  05/14/2013  FINDINGS: Pectus excavatum deformity. Midline trachea. Normal heart size and mediastinal contours. Mild S-shaped thoracic spine curvature. No pleural effusion or pneumothorax. Clear lungs.  IMPRESSION: No acute cardiopulmonary disease.   Electronically Signed   By: Jeronimo Greaves   On: 06/12/2013 23:38   Ct Angio Chest Pe W/cm &/or Wo Cm  06/13/2013   *RADIOLOGY REPORT*  Clinical Data: Chest pain  CT ANGIOGRAPHY CHEST  Technique:  Multidetector CT imaging of the chest using the standard protocol during bolus administration of  intravenous contrast. Multiplanar reconstructed images including MIPs were obtained and reviewed to evaluate the vascular anatomy.  Contrast: 80mL OMNIPAQUE IOHEXOL 350 MG/ML SOLN  Comparison: Radiograph performed earlier on the same day  Findings: There are no pathologically enlarged mediastinal, hilar, or axillary lymph nodes.  Aorta and great vessels are within normal limits.  Heart size within normal limits.  No pericardial effusion.  The pulmonary arterial tree is well opacified.  No filling defects to suggest acute pulmonary embolus are identified.  Reformatted imaging confirms these findings.  The lungs are clear without evidence of pneumothorax, airspace consolidation, pulmonary edema, or pleural effusion.  No pulmonary nodule or mass is identified.  The visualized portions of the upper abdomen are within normal  limits.  No acute osseous abnormality is seen.  IMPRESSION: No CT evidence of acute pulmonary embolism.   Original Report Authenticated By: Rise Mu, M.D.   US Abdomen Complete  06/13/2013   CLINICAL DATA:  Right upper quadrant abdominal pain. Back pain.  EXAM: ULTRASOUND ABDOMEN COMPLETE  COMPARISON:  03/14/2013 CT.  Ultrasound 02/29/2012.  FINDINGS: Gallbladder  8 mm gallstone. Borderline gallbladder distention, without wall thickening or pericholecystic fluid. Sonographic Murphy's sign was not elicited.  Common bile duct  Diameter: Upper normal to minimally dilated. 6-8 mm. Marland Kitchen Upper normal 6 mm in this age group.  Liver  No focal lesion identified. Within normal limits in parenchymal echogenicity.  IVC  No abnormality visualized.  Pancreas  Visualized portion unremarkable.  Spleen  Size and appearance within normal limits.  Right Kidney  Length: 11.1 cm echogenicity within normal limits. No mass or hydronephrosis visualized.  Left Kidney  Length: 12.1 cm. Echogenicity within normal limits. No mass or hydronephrosis visualized.  Abdominal aorta  No aneurysm visualized.  IMPRESSION: 1.  Cholelithiasis without acute cholecystitis. 2. Common bile duct upper normal to minimally dilated for age. Correlate with bilirubin levels. If these are elevated, consider further characterization with contrast-enhanced CT or MRCP. Marland Kitchen   Electronically Signed   By: Jeronimo Greaves   On: 06/13/2013 03:58    Date: 06/13/2013  Rate: 95  Rhythm: normal sinus rhythm  QRS Axis: normal  Intervals: normal  ST/T Wave abnormalities: nonspecific ST changes  Conduction Disutrbances:none  Narrative Interpretation:   Old EKG Reviewed: none available    MDM  No diagnosis found. Donna Davis is a 42 y.o. female here with epigastric pain, chest pain. Consider pancreatitis vs cholecystitis vs kidney stone vs PE and less likely to be ACS. Will get labs, lipase, trop x 1, d-dimer. Will get RUQ [place] and UA.   6:34 AM RUQ showed cholelithiasis no cholecystitis. LFTs unremarkable. D-dimer elevated but CT angio chest showed no PE. Pain improved. Will d/c home with pain meds and surgery f/u.       Richardean Canal, MD 06/13/13 [phone]

## 2013-06-13 NOTE — ED Notes (Signed)
Patient states that about 930pm she started with pain to the epigastric area and it radiated to the breasts bilaterally.  Since she has ben waiting her right breast is the only one hurting.  +diaphoresis and nausea.  Took Advil 800mg  and Gas X prior to coming to the ED

## 2013-06-18 ENCOUNTER — Encounter: Payer: Self-pay | Admitting: Internal Medicine

## 2013-06-18 ENCOUNTER — Other Ambulatory Visit: Payer: Self-pay | Admitting: *Deleted

## 2013-06-18 MED ORDER — METOCLOPRAMIDE HCL 10 MG PO TABS: 10.0000 mg | ORAL_TABLET | Freq: Four times a day (QID) | ORAL | Status: DC | PRN

## 2013-06-18 NOTE — Telephone Encounter (Signed)
Called to pharm 

## 2013-06-18 NOTE — Telephone Encounter (Signed)
I have sent a note to the patient to inquire about her symptoms prior to refilling, fyi.

## 2013-06-24 ENCOUNTER — Encounter (INDEPENDENT_AMBULATORY_CARE_PROVIDER_SITE_OTHER): Payer: Self-pay | Admitting: Surgery

## 2013-06-24 ENCOUNTER — Ambulatory Visit (INDEPENDENT_AMBULATORY_CARE_PROVIDER_SITE_OTHER): Payer: BC Managed Care – PPO | Admitting: Surgery

## 2013-06-24 VITALS — BP 121/77 | HR 68 | Temp 98.6°F | Resp 14 | Ht 60.0 in | Wt 177.4 lb

## 2013-06-24 DIAGNOSIS — K8020 Calculus of gallbladder without cholecystitis without obstruction: Secondary | ICD-10-CM

## 2013-06-24 MED ORDER — OXYCODONE-ACETAMINOPHEN 5-325 MG PO TABS: 2.0000 | ORAL_TABLET | ORAL | Status: DC | PRN

## 2013-06-24 NOTE — Progress Notes (Signed)
Patient ID: Donna Davis, female   DOB: October 11, 1970, 42 y.o.   MRN: [id number]  Chief Complaint  Patient presents with  . Abdominal Pain    gallbladder    HPI Donna Davis is a 42 y.o. female.   HPI This is a very pleasant female who is hearing impaired. She is referred by the emergency department for symptomatic cholelithiasis. She recently had an attack of severe epigastric abdominal pain hurting up into her chest. She had nausea but no vomiting. She is still having some discomfort but it is much less. She has had similar attacks in the past. The pain is sharp and moderate in intensity. Bowel movements are normal. Past Medical History  Diagnosis Date  . GERD (gastroesophageal reflux disease)   . Hypothyroidism   . IBS (irritable bowel syndrome)   . Anxiety   . Hearing difficulty     b/l, 2/2 congenital rubella s/p stapedectomy. Using hearing aid  . Headache(784.0) 04/2005    h/o headache in past that raised question of psuedotumor s/p therapeutic LP with an opening preassure of 18 cm H2O   . Hepatic cyst     6 mm on CT done done in 05/2005  . Family history of malignant neoplasm of gastrointestinal tract   . Shingles 03/2013    Past Surgical History  Procedure Laterality Date  . Abdominal hysterectomy      partial hysterecotmy 2003 for endoemtriosis with residula right ovaey. Multiple GYN surgeries for chronic pelvic pain  . Cesarean section  1994  . Stapedectomy  2007    Dr. Ezzard Standing, left ear  . Laparoscopic ovarian cystectomy  2001  . Thyroid nodule removed  1997  . Total abdominal hysterectomy w/ bilateral salpingoophorectomy      left  . Abdominal adhesion surgery      removal from bowel  . Joint replacement      Family History  Problem Relation Age of Onset  . Breast cancer Mother   . Ovarian cancer Mother   . Diabetes Mother   . Stomach cancer Maternal Grandmother   . Colon cancer Maternal Grandmother   . Diabetes Maternal Grandmother   . Pancreatic cancer Father    . Prostate cancer Father   . Colon cancer Father   . Diabetes Father   . Heart disease Father   . Irritable bowel syndrome Father   . Kidney disease Father   . Heart disease      both grandmothers  . Bipolar disorder Daughter     Social History History  Substance Use Topics  . Smoking status: Former Smoker    Types: Cigarettes  . Smokeless tobacco: Never Used     Comment: quit 29yrs ago  . Alcohol Use: No    Allergies  Allergen Reactions  . Onion Anaphylaxis  . Shellfish Allergy Anaphylaxis and Swelling  . Amitriptyline Anxiety, Palpitations and Other (See Comments)    Hallucinations  . Amoxicillin-Pot Clavulanate Nausea And Vomiting    With high dose, low dose ok  . Cefuroxime Nausea And Vomiting  . Cephalexin Other (See Comments)     gi upset  . Ciprofloxacin Other (See Comments)     Fevers  . Clarithromycin Other (See Comments)     gi upset  . Clarithromycin Nausea And Vomiting    Pt is OK with azithromycin  . Doxycycline Other (See Comments)     gi upset  . Other Other (See Comments)    Steroids Went crazy  . Prednisone Other (  See Comments)     delusions  . Propranolol Other (See Comments)    Fatigue and makes her "feel crazy", refuses to take.  . Shrimp Flavor Other (See Comments)    unknown  . Sulfonamide Derivatives Other (See Comments)    vaginal blisters  . Tramadol Hcl Other (See Comments)     "makes me nuts"    Current Outpatient Prescriptions  Medication Sig Dispense Refill  . Ascorbic Acid (VITAMIN C PO) Take 1 tablet by mouth 2 (two) times daily.      Marland Kitchen levothyroxine (SYNTHROID, LEVOTHROID) 25 MCG tablet Take 1 tablet (25 mcg total) by mouth daily.  31 tablet  11  . metoCLOPramide (REGLAN) 10 MG tablet Take 1 tablet (10 mg total) by mouth every 6 (six) hours as needed (nausea/headache).  30 tablet  0  . Norethindrone-Ethinyl Estradiol-Fe Biphas (LO LOESTRIN FE) 1 MG-10 MCG / 10 MCG tablet Take 1 tablet by mouth daily.  3 Package  4  .  oxyCODONE-acetaminophen (PERCOCET) 5-325 MG per tablet Take 2 tablets by mouth every 4 (four) hours as needed for pain.  10 tablet  0   No current facility-administered medications for this visit.    Review of Systems Review of Systems  Constitutional: Negative for fever, chills and unexpected weight change.  HENT: Positive for hearing loss. Negative for congestion, sore throat, trouble swallowing and voice change.   Eyes: Negative for visual disturbance.  Respiratory: Negative for cough and wheezing.   Cardiovascular: Negative for chest pain, palpitations and leg swelling.  Gastrointestinal: Positive for nausea and abdominal pain. Negative for vomiting, diarrhea, constipation, blood in stool, abdominal distention and anal bleeding.  Genitourinary: Negative for hematuria, vaginal bleeding and difficulty urinating.  Musculoskeletal: Negative for arthralgias.  Skin: Negative for rash and wound.  Neurological: Negative for seizures, syncope and headaches.  Hematological: Negative for adenopathy. Does not bruise/bleed easily.  Psychiatric/Behavioral: Negative for confusion.    Blood pressure 121/77, pulse 68, temperature 98.6 F (37 C), temperature source Temporal, resp. rate 14, height 5' (1.524 m), weight 177 lb 6.4 oz (80.468 kg), last menstrual period 12/26/2012.  Physical Exam Physical Exam  Constitutional: She is oriented to person, place, and time. She appears well-developed and well-nourished. No distress.  HENT:  Head: Normocephalic and atraumatic.  Right Ear: External ear normal.  Left Ear: External ear normal.  Nose: Nose normal.  Mouth/Throat: Oropharynx is clear and moist. No oropharyngeal exudate.  Eyes: Conjunctivae are normal. Pupils are equal, round, and reactive to light. Right eye exhibits no discharge. Left eye exhibits no discharge.  Neck: Normal range of motion. Neck supple. No tracheal deviation present.  Cardiovascular: Normal rate, regular rhythm, normal heart  sounds and intact distal pulses.   No murmur heard. Pulmonary/Chest: Effort normal and breath sounds normal. No respiratory distress. She has no wheezes.  Abdominal: Soft. Bowel sounds are normal. There is tenderness. There is guarding.  Mild tenderness with guarding in the right upper quadrant  Musculoskeletal: Normal range of motion. She exhibits no edema and no tenderness.  Lymphadenopathy:    She has no cervical adenopathy.  Neurological: She is alert and oriented to person, place, and time.  Skin: Skin is warm and dry. No rash noted. She is not diaphoretic. No erythema.  Psychiatric: Her behavior is normal. Judgment normal.    Data Reviewed She has liver function tests which are normal. Her lipase was elevated. Her ultrasound showed a slightly distended gallbladder with a gallstone a slightly prominent common bile  duct  Assessment    Symptomatic cholelithiasis     Plan    Laparoscopic cholecystectomy with cholangiogram is recommended. I discussed this with her in detail through an interpreter. I gave her literature regarding the surgery. I discussed the risks which includes but is not limited to bleeding, infection, injury to other structures, bile leak, bile duct injury, need to convert to an open procedure, et Karie Soda. I also discussed postop recovery. Surgery will be scheduled as soon as possible        Dorea Duff A 06/24/2013, 1:58 PM

## 2013-06-25 ENCOUNTER — Encounter (HOSPITAL_COMMUNITY): Payer: Self-pay | Admitting: *Deleted

## 2013-06-25 NOTE — H&P (Signed)
Patient ID: Donna Davis, female DOB: 16-Feb-1971, 42 y.o. MRN: [id number]  Chief Complaint   Patient presents with   .  Abdominal Pain     gallbladder   HPI  Donna Davis is a 42 y.o. female.  HPI  This is a very pleasant female who is hearing impaired. She is referred by the emergency department for symptomatic cholelithiasis. She recently had an attack of severe epigastric abdominal pain hurting up into her chest. She had nausea but no vomiting. She is still having some discomfort but it is much less. She has had similar attacks in the past. The pain is sharp and moderate in intensity. Bowel movements are normal.  Past Medical History   Diagnosis  Date   .  GERD (gastroesophageal reflux disease)    .  Hypothyroidism    .  IBS (irritable bowel syndrome)    .  Anxiety    .  Hearing difficulty      b/l, 2/2 congenital rubella s/p stapedectomy. Using hearing aid   .  Headache(784.0)  04/2005     h/o headache in past that raised question of psuedotumor s/p therapeutic LP with an opening preassure of 18 cm H2O   .  Hepatic cyst      6 mm on CT done done in 05/2005   .  Family history of malignant neoplasm of gastrointestinal tract    .  Shingles  03/2013    Past Surgical History   Procedure  Laterality  Date   .  Abdominal hysterectomy       partial hysterecotmy 2003 for endoemtriosis with residula right ovaey. Multiple GYN surgeries for chronic pelvic pain   .  Cesarean section   1994   .  Stapedectomy   2007     Dr. Ezzard Standing, left ear   .  Laparoscopic ovarian cystectomy   2001   .  Thyroid nodule removed   1997   .  Total abdominal hysterectomy w/ bilateral salpingoophorectomy       left   .  Abdominal adhesion surgery       removal from bowel   .  Joint replacement      Family History   Problem  Relation  Age of Onset   .  Breast cancer  Mother    .  Ovarian cancer  Mother    .  Diabetes  Mother    .  Stomach cancer  Maternal Grandmother    .  Colon cancer  Maternal Grandmother     .  Diabetes  Maternal Grandmother    .  Pancreatic cancer  Father    .  Prostate cancer  Father    .  Colon cancer  Father    .  Diabetes  Father    .  Heart disease  Father    .  Irritable bowel syndrome  Father    .  Kidney disease  Father    .  Heart disease       both grandmothers   .  Bipolar disorder  Daughter    Social History  History   Substance Use Topics   .  Smoking status:  Former Smoker     Types:  Cigarettes   .  Smokeless tobacco:  Never Used      Comment: quit 80yrs ago   .  Alcohol Use:  No    Allergies   Allergen  Reactions   .  Onion  Anaphylaxis   .  Shellfish Allergy  Anaphylaxis and Swelling   .  Amitriptyline  Anxiety, Palpitations and Other (See Comments)     Hallucinations   .  Amoxicillin-Pot Clavulanate  Nausea And Vomiting     With high dose, low dose ok   .  Cefuroxime  Nausea And Vomiting   .  Cephalexin  Other (See Comments)     gi upset   .  Ciprofloxacin  Other (See Comments)     Fevers   .  Clarithromycin  Other (See Comments)     gi upset   .  Clarithromycin  Nausea And Vomiting     Pt is OK with azithromycin   .  Doxycycline  Other (See Comments)     gi upset   .  Other  Other (See Comments)     Steroids  Went crazy   .  Prednisone  Other (See Comments)     delusions   .  Propranolol  Other (See Comments)     Fatigue and makes her "feel crazy", refuses to take.   .  Shrimp Flavor  Other (See Comments)     unknown   .  Sulfonamide Derivatives  Other (See Comments)     vaginal blisters   .  Tramadol Hcl  Other (See Comments)     "makes me nuts"    Current Outpatient Prescriptions   Medication  Sig  Dispense  Refill   .  Ascorbic Acid (VITAMIN C PO)  Take 1 tablet by mouth 2 (two) times daily.     Marland Kitchen  levothyroxine (SYNTHROID, LEVOTHROID) 25 MCG tablet  Take 1 tablet (25 mcg total) by mouth daily.  31 tablet  11   .  metoCLOPramide (REGLAN) 10 MG tablet  Take 1 tablet (10 mg total) by mouth every 6 (six) hours as needed  (nausea/headache).  30 tablet  0   .  Norethindrone-Ethinyl Estradiol-Fe Biphas (LO LOESTRIN FE) 1 MG-10 MCG / 10 MCG tablet  Take 1 tablet by mouth daily.  3 Package  4   .  oxyCODONE-acetaminophen (PERCOCET) 5-325 MG per tablet  Take 2 tablets by mouth every 4 (four) hours as needed for pain.  10 tablet  0    No current facility-administered medications for this visit.   Review of Systems  Review of Systems  Constitutional: Negative for fever, chills and unexpected weight change.  HENT: Positive for hearing loss. Negative for congestion, sore throat, trouble swallowing and voice change.  Eyes: Negative for visual disturbance.  Respiratory: Negative for cough and wheezing.  Cardiovascular: Negative for chest pain, palpitations and leg swelling.  Gastrointestinal: Positive for nausea and abdominal pain. Negative for vomiting, diarrhea, constipation, blood in stool, abdominal distention and anal bleeding.  Genitourinary: Negative for hematuria, vaginal bleeding and difficulty urinating.  Musculoskeletal: Negative for arthralgias.  Skin: Negative for rash and wound.  Neurological: Negative for seizures, syncope and headaches.  Hematological: Negative for adenopathy. Does not bruise/bleed easily.  Psychiatric/Behavioral: Negative for confusion.  Blood pressure 121/77, pulse 68, temperature 98.6 F (37 C), temperature source Temporal, resp. rate 14, height 5' (1.524 m), weight 177 lb 6.4 oz (80.468 kg), last menstrual period 12/26/2012.  Physical Exam  Physical Exam  Constitutional: She is oriented to person, place, and time. She appears well-developed and well-nourished. No distress.  HENT:  Head: Normocephalic and atraumatic.  Right Ear: External ear normal.  Left Ear: External ear normal.  Nose: Nose normal.  Mouth/Throat: Oropharynx is clear and moist. No oropharyngeal  exudate.  Eyes: Conjunctivae are normal. Pupils are equal, round, and reactive to light. Right eye exhibits no  discharge. Left eye exhibits no discharge.  Neck: Normal range of motion. Neck supple. No tracheal deviation present.  Cardiovascular: Normal rate, regular rhythm, normal heart sounds and intact distal pulses.  No murmur heard.  Pulmonary/Chest: Effort normal and breath sounds normal. No respiratory distress. She has no wheezes.  Abdominal: Soft. Bowel sounds are normal. There is tenderness. There is guarding.  Mild tenderness with guarding in the right upper quadrant  Musculoskeletal: Normal range of motion. She exhibits no edema and no tenderness.  Lymphadenopathy:  She has no cervical adenopathy.  Neurological: She is alert and oriented to person, place, and time.  Skin: Skin is warm and dry. No rash noted. She is not diaphoretic. No erythema.  Psychiatric: Her behavior is normal. Judgment normal.  Data Reviewed  She has liver function tests which are normal. Her lipase was elevated. Her ultrasound showed a slightly distended gallbladder with a gallstone a slightly prominent common bile duct  Assessment  Symptomatic cholelithiasis  Plan  Laparoscopic cholecystectomy with cholangiogram is recommended. I discussed this with her in detail through an interpreter. I gave her literature regarding the surgery. I discussed the risks which includes but is not limited to bleeding, infection, injury to other structures, bile leak, bile duct injury, need to convert to an open procedure, et Karie Soda. I also discussed postop recovery. Surgery will be scheduled as soon as possible

## 2013-06-26 ENCOUNTER — Ambulatory Visit (HOSPITAL_COMMUNITY): Payer: BC Managed Care – PPO | Admitting: Anesthesiology

## 2013-06-26 ENCOUNTER — Encounter (HOSPITAL_COMMUNITY): Payer: Self-pay | Admitting: *Deleted

## 2013-06-26 ENCOUNTER — Encounter (HOSPITAL_COMMUNITY)
Admission: RE | Disposition: A | Discharge status: Home / Self Care | Payer: Self-pay | Source: Ambulatory Visit | Attending: Surgery

## 2013-06-26 ENCOUNTER — Encounter (HOSPITAL_COMMUNITY): Payer: BC Managed Care – PPO | Admitting: Anesthesiology

## 2013-06-26 ENCOUNTER — Ambulatory Visit (HOSPITAL_COMMUNITY)
Admission: RE | Admit: 2013-06-26 | Discharge: 2013-06-26 | Disposition: A | Discharge status: Home / Self Care | Payer: BC Managed Care – PPO | Source: Ambulatory Visit | Attending: Surgery | Admitting: Surgery

## 2013-06-26 ENCOUNTER — Ambulatory Visit (HOSPITAL_COMMUNITY): Payer: BC Managed Care – PPO

## 2013-06-26 DIAGNOSIS — K8020 Calculus of gallbladder without cholecystitis without obstruction: Secondary | ICD-10-CM

## 2013-06-26 DIAGNOSIS — K8064 Calculus of gallbladder and bile duct with chronic cholecystitis without obstruction: Secondary | ICD-10-CM | POA: Insufficient documentation

## 2013-06-26 DIAGNOSIS — K8010 Calculus of gallbladder with chronic cholecystitis without obstruction: Secondary | ICD-10-CM

## 2013-06-26 DIAGNOSIS — L299 Pruritus, unspecified: Secondary | ICD-10-CM | POA: Insufficient documentation

## 2013-06-26 DIAGNOSIS — K8060 Calculus of gallbladder and bile duct with cholecystitis, unspecified, without obstruction: Secondary | ICD-10-CM | POA: Insufficient documentation

## 2013-06-26 DIAGNOSIS — K219 Gastro-esophageal reflux disease without esophagitis: Secondary | ICD-10-CM | POA: Insufficient documentation

## 2013-06-26 HISTORY — PX: CHOLECYSTECTOMY: SHX55

## 2013-06-26 SURGERY — LAPAROSCOPIC CHOLECYSTECTOMY WITH INTRAOPERATIVE CHOLANGIOGRAM
Anesthesia: General | Site: Abdomen | Wound class: Clean Contaminated

## 2013-06-26 MED ORDER — FENTANYL CITRATE 0.05 MG/ML IJ SOLN
INTRAMUSCULAR | Status: DC | PRN
  Administered 2013-06-26: 50 ug via INTRAVENOUS
  Administered 2013-06-26: 100 ug via INTRAVENOUS
  Administered 2013-06-26: 50 ug via INTRAVENOUS

## 2013-06-26 MED ORDER — RINGERS IRRIGATION IR SOLN
Status: DC | PRN
  Administered 2013-06-26: 300 mL

## 2013-06-26 MED ORDER — NEOSTIGMINE METHYLSULFATE 1 MG/ML IJ SOLN
INTRAMUSCULAR | Status: DC | PRN
  Administered 2013-06-26: 3.5 mg via INTRAVENOUS

## 2013-06-26 MED ORDER — HYDROMORPHONE HCL PF 1 MG/ML IJ SOLN
INTRAMUSCULAR | Status: DC | PRN
  Administered 2013-06-26 (×2): 1 mg via INTRAVENOUS

## 2013-06-26 MED ORDER — LACTATED RINGERS IV SOLN
INTRAVENOUS | Status: DC
  Administered 2013-06-26: 1000 mL via INTRAVENOUS

## 2013-06-26 MED ORDER — HYDROMORPHONE HCL PF 1 MG/ML IJ SOLN
0.2500 mg | INTRAMUSCULAR | Status: DC | PRN
  Administered 2013-06-26 (×4): 0.5 mg via INTRAVENOUS

## 2013-06-26 MED ORDER — OXYCODONE HCL 5 MG PO TABS
5.0000 mg | ORAL_TABLET | ORAL | Status: DC | PRN
  Administered 2013-06-26 (×2): 5 mg via ORAL
  Filled 2013-06-26 (×2): qty 1

## 2013-06-26 MED ORDER — VANCOMYCIN HCL IN DEXTROSE 1-5 GM/200ML-% IV SOLN
INTRAVENOUS | Status: AC
  Filled 2013-06-26: qty 200

## 2013-06-26 MED ORDER — ROCURONIUM BROMIDE 100 MG/10ML IV SOLN
INTRAVENOUS | Status: DC | PRN
  Administered 2013-06-26: 20 mg via INTRAVENOUS

## 2013-06-26 MED ORDER — OXYCODONE-ACETAMINOPHEN 5-325 MG PO TABS: 2.0000 | ORAL_TABLET | ORAL | Status: DC | PRN

## 2013-06-26 MED ORDER — HYDROMORPHONE HCL PF 1 MG/ML IJ SOLN
INTRAMUSCULAR | Status: AC
  Filled 2013-06-26: qty 1

## 2013-06-26 MED ORDER — BUPIVACAINE HCL (PF) 0.5 % IJ SOLN
INTRAMUSCULAR | Status: DC | PRN
  Administered 2013-06-26: 20 mL

## 2013-06-26 MED ORDER — SODIUM CHLORIDE 0.9 % IR SOLN
Status: DC | PRN
  Administered 2013-06-26: 100 mL

## 2013-06-26 MED ORDER — ONDANSETRON HCL 4 MG/2ML IJ SOLN
INTRAMUSCULAR | Status: DC | PRN
  Administered 2013-06-26: 4 mg via INTRAMUSCULAR

## 2013-06-26 MED ORDER — VANCOMYCIN HCL IN DEXTROSE 1-5 GM/200ML-% IV SOLN
1000.0000 mg | INTRAVENOUS | Status: AC
  Administered 2013-06-26: 1000 mg via INTRAVENOUS

## 2013-06-26 MED ORDER — DIATRIZOATE MEGLUMINE 30 % UR SOLN
URETHRAL | Status: DC | PRN
  Administered 2013-06-26: 11 mL

## 2013-06-26 MED ORDER — SUCCINYLCHOLINE CHLORIDE 20 MG/ML IJ SOLN
INTRAMUSCULAR | Status: DC | PRN
  Administered 2013-06-26: 100 mg via INTRAVENOUS

## 2013-06-26 MED ORDER — MIDAZOLAM HCL 5 MG/5ML IJ SOLN
INTRAMUSCULAR | Status: DC | PRN
  Administered 2013-06-26: 2 mg via INTRAVENOUS

## 2013-06-26 MED ORDER — LIDOCAINE HCL (CARDIAC) 20 MG/ML IV SOLN
INTRAVENOUS | Status: DC | PRN
  Administered 2013-06-26: 60 mg via INTRAVENOUS

## 2013-06-26 MED ORDER — BUPIVACAINE-EPINEPHRINE 0.25% -1:200000 IJ SOLN
INTRAMUSCULAR | Status: AC
  Filled 2013-06-26: qty 1

## 2013-06-26 MED ORDER — GLYCOPYRROLATE 0.2 MG/ML IJ SOLN
INTRAMUSCULAR | Status: DC | PRN
  Administered 2013-06-26: .5 mg via INTRAVENOUS

## 2013-06-26 MED ORDER — PROPOFOL 10 MG/ML IV BOLUS
INTRAVENOUS | Status: DC | PRN
  Administered 2013-06-26: 150 mg via INTRAVENOUS

## 2013-06-26 MED ORDER — LACTATED RINGERS IV SOLN: INTRAVENOUS | Status: DC

## 2013-06-26 MED ORDER — LACTATED RINGERS IV SOLN
INTRAVENOUS | Status: DC | PRN
  Administered 2013-06-26 (×2): via INTRAVENOUS

## 2013-06-26 SURGICAL SUPPLY — 35 items
APL SKNCLS STERI-STRIP NONHPOA (GAUZE/BANDAGES/DRESSINGS)
APPLIER CLIP 5 13 M/L LIGAMAX5 (MISCELLANEOUS) ×2
APR CLP MED LRG 5 ANG JAW (MISCELLANEOUS) ×1
BAG SPEC RTRVL LRG 6X4 10 (ENDOMECHANICALS) ×1
BANDAGE ADHESIVE 1X3 (GAUZE/BANDAGES/DRESSINGS) ×8 IMPLANT
BENZOIN TINCTURE PRP APPL 2/3 (GAUZE/BANDAGES/DRESSINGS) ×1 IMPLANT
CANISTER SUCTION 2500CC (MISCELLANEOUS) ×2 IMPLANT
CHLORAPREP W/TINT 26ML (MISCELLANEOUS) ×2 IMPLANT
CLIP APPLIE 5 13 M/L LIGAMAX5 (MISCELLANEOUS) ×1 IMPLANT
CLOTH BEACON ORANGE TIMEOUT ST (SAFETY) ×1 IMPLANT
COVER MAYO STAND STRL (DRAPES) ×2 IMPLANT
DECANTER SPIKE VIAL GLASS SM (MISCELLANEOUS) ×2 IMPLANT
DRAPE C-ARM 42X120 X-RAY (DRAPES) ×2 IMPLANT
DRAPE LAPAROSCOPIC ABDOMINAL (DRAPES) ×2 IMPLANT
DRAPE UTILITY XL STRL (DRAPES) ×2 IMPLANT
ELECT REM PT RETURN 9FT ADLT (ELECTROSURGICAL) ×2
ELECTRODE REM PT RTRN 9FT ADLT (ELECTROSURGICAL) ×1 IMPLANT
GLOVE SURG SIGNA 7.5 PF LTX (GLOVE) ×2 IMPLANT
GOWN STRL REIN XL XLG (GOWN DISPOSABLE) ×6 IMPLANT
HEMOSTAT SURGICEL 4X8 (HEMOSTASIS) IMPLANT
KIT BASIN OR (CUSTOM PROCEDURE TRAY) ×2 IMPLANT
NS IRRIG 1000ML POUR BTL (IV SOLUTION) IMPLANT
POUCH SPECIMEN RETRIEVAL 10MM (ENDOMECHANICALS) ×2 IMPLANT
SET CHOLANGIOGRAPH MIX (MISCELLANEOUS) ×1 IMPLANT
SET IRRIG TUBING LAPAROSCOPIC (IRRIGATION / IRRIGATOR) ×2 IMPLANT
SOLUTION ANTI FOG 6CC (MISCELLANEOUS) ×2 IMPLANT
STRIP CLOSURE SKIN 1/2X4 (GAUZE/BANDAGES/DRESSINGS) ×2 IMPLANT
SUT MNCRL AB 4-0 PS2 18 (SUTURE) ×2 IMPLANT
TOWEL OR 17X26 10 PK STRL BLUE (TOWEL DISPOSABLE) ×2 IMPLANT
TOWEL OR NON WOVEN STRL DISP B (DISPOSABLE) ×2 IMPLANT
TRAY LAP CHOLE (CUSTOM PROCEDURE TRAY) ×2 IMPLANT
TROCAR BLADELESS OPT 5 75 (ENDOMECHANICALS) ×2 IMPLANT
TROCAR SLEEVE XCEL 5X75 (ENDOMECHANICALS) ×4 IMPLANT
TROCAR XCEL BLUNT TIP 100MML (ENDOMECHANICALS) ×2 IMPLANT
TUBING INSUFFLATION 10FT LAP (TUBING) ×2 IMPLANT

## 2013-06-26 NOTE — Anesthesia Preprocedure Evaluation (Addendum)
Anesthesia Evaluation  Patient identified by MRN, date of birth, ID band Patient awake    Reviewed: Allergy & Precautions, H&P , NPO status , Patient's Chart, lab work & pertinent test results  Airway Mallampati: II TM Distance: >3 FB Neck ROM: full    Dental no notable dental hx. (+) Teeth Intact and Dental Advisory Given   Pulmonary neg pulmonary ROS,  breath sounds clear to auscultation  Pulmonary exam normal       Cardiovascular Exercise Tolerance: Good negative cardio ROS  Rhythm:regular Rate:Normal     Neuro/Psych  Headaches, Anxiety Depression deaf negative neurological ROS  negative psych ROS   GI/Hepatic negative GI ROS, Neg liver ROS, GERD-  ,  Endo/Other  negative endocrine ROSHypothyroidism   Renal/GU negative Renal ROS  negative genitourinary   Musculoskeletal   Abdominal   Peds  Hematology negative hematology ROS (+)   Anesthesia Other Findings   Reproductive/Obstetrics negative OB ROS                          Anesthesia Physical Anesthesia Plan  ASA: II  Anesthesia Plan: General   Post-op Pain Management:    Induction: Intravenous  Airway Management Planned: Oral ETT  Additional Equipment:   Intra-op Plan:   Post-operative Plan: Extubation in OR  Informed Consent: I have reviewed the patients History and Physical, chart, labs and discussed the procedure including the risks, benefits and alternatives for the proposed anesthesia with the patient or authorized representative who has indicated his/her understanding and acceptance.   Dental Advisory Given  Plan Discussed with: CRNA and Surgeon  Anesthesia Plan Comments:         Anesthesia Quick Evaluation

## 2013-06-26 NOTE — Transfer of Care (Signed)
Immediate Anesthesia Transfer of Care Note  Patient: Donna Davis  Procedure(s) Performed: Procedure(s): LAPAROSCOPIC CHOLECYSTECTOMY WITH ATTEMPTED INTRAOPERATIVE CHOLANGIOGRAM (N/A)  Patient Location: PACU  Anesthesia Type:General  Level of Consciousness: awake, alert  and oriented  Airway & Oxygen Therapy: Patient Spontanous Breathing and Patient connected to face mask oxygen  Post-op Assessment: Report given to PACU RN and Post -op Vital signs reviewed and stable  Post vital signs: Reviewed and stable  Complications: No apparent anesthesia complications

## 2013-06-26 NOTE — Anesthesia Postprocedure Evaluation (Signed)
  Anesthesia Post-op Note  Patient: Donna Davis  Procedure(s) Performed: Procedure(s) (LRB): LAPAROSCOPIC CHOLECYSTECTOMY WITH ATTEMPTED INTRAOPERATIVE CHOLANGIOGRAM (N/A)  Patient Location: PACU  Anesthesia Type: General  Level of Consciousness: awake and alert   Airway and Oxygen Therapy: Patient Spontanous Breathing  Post-op Pain: mild  Post-op Assessment: Post-op Vital signs reviewed, Patient's Cardiovascular Status Stable, Respiratory Function Stable, Patent Airway and No signs of Nausea or vomiting  Last Vitals:  Filed Vitals:   06/26/13 1200  BP: 98/58  Pulse: 66  Temp:   Resp: 16    Post-op Vital Signs: stable   Complications: No apparent anesthesia complications

## 2013-06-26 NOTE — Interval H&P Note (Signed)
History and Physical Interval Note: no change in H and P  06/26/2013 9:53 AM  Donna Davis  has presented today for surgery, with the diagnosis of gallstones  The various methods of treatment have been discussed with the patient and family. After consideration of risks, benefits and other options for treatment, the patient has consented to  Procedure(s): LAPAROSCOPIC CHOLECYSTECTOMY WITH INTRAOPERATIVE CHOLANGIOGRAM (N/A) as a surgical intervention .  The patient's history has been reviewed, patient examined, no change in status, stable for surgery.  I have reviewed the patient's chart and labs.  Questions were answered to the patient's satisfaction.     Mercades Bajaj A

## 2013-06-26 NOTE — Preoperative (Signed)
Beta Blockers   Reason not to administer Beta Blockers:Not Applicable 

## 2013-06-26 NOTE — Progress Notes (Signed)
Pt stated she did one hibaclens bath d/t she starting itching after her first bath.  She is itching while in short stay but she states it is tolerable.

## 2013-06-26 NOTE — Op Note (Signed)
Laparoscopic Cholecystectomy Procedure Note  Indications: This patient presents with symptomatic gallbladder disease and will undergo laparoscopic cholecystectomy.  Pre-operative Diagnosis: Calculus of gallbladder and bile duct with other cholecystitis without mention of obstruction  Post-operative Diagnosis: Same  Surgeon: Abigail Miyamoto A   Assistants: 0  Anesthesia: General endotracheal anesthesia  ASA Class: 1  Procedure Details  The patient was seen again in the Holding Room. The risks, benefits, complications, treatment options, and expected outcomes were discussed with the patient. The possibilities of reaction to medication, pulmonary aspiration, perforation of viscus, bleeding, recurrent infection, finding a normal gallbladder, the need for additional procedures, failure to diagnose a condition, the possible need to convert to an open procedure, and creating a complication requiring transfusion or operation were discussed with the patient. The likelihood of improving the patient's symptoms with return to their baseline status is good.  The patient and/or family concurred with the proposed plan, giving informed consent. The site of surgery properly noted. The patient was taken to Operating Room, identified as Donna Davis and the procedure verified as Laparoscopic Cholecystectomy with Intraoperative Cholangiogram. A Time Out was held and the above information confirmed.  Prior to the induction of general anesthesia, antibiotic prophylaxis was administered. General endotracheal anesthesia was then administered and tolerated well. After the induction, the abdomen was prepped with Chloraprep and draped in sterile fashion. The patient was positioned in the supine position.  Local anesthetic agent was injected into the skin near the umbilicus and an incision made. We dissected down to the abdominal fascia with blunt dissection.  The fascia was incised vertically and we entered the  peritoneal cavity bluntly.  A pursestring suture of 0-Vicryl was placed around the fascial opening.  The Hasson cannula was inserted and secured with the stay suture.  Pneumoperitoneum was then created with CO2 and tolerated well without any adverse changes in the patient's vital signs. An 11-mm port was placed in the subxiphoid position.  Two 5-mm ports were placed in the right upper quadrant. All skin incisions were infiltrated with a local anesthetic agent before making the incision and placing the trocars.   We positioned the patient in reverse Trendelenburg, tilted slightly to the patient's left.  The gallbladder was identified, the fundus grasped and retracted cephalad. Adhesions were lysed bluntly and with the electrocautery where indicated, taking care not to injure any adjacent organs or viscus. The infundibulum was grasped and retracted laterally, exposing the peritoneum overlying the triangle of Calot. This was then divided and exposed in a blunt fashion. The cystic duct was clearly identified and bluntly dissected circumferentially. A critical view of the cystic duct and cystic artery was obtained.  I attempted a cholangiogram through a cholangiocath placed under direct vision in the RUQ.  The catheter crimped twice and wound not flow so the cholangiogram was aborted.  The cystic duct was then ligated with clips and divided. The cystic artery was, dissected free, ligated with clips and divided as well.   The gallbladder was dissected from the liver bed in retrograde fashion with the electrocautery. The gallbladder was removed and placed in an Endocatch sac. The liver bed was irrigated and inspected. Hemostasis was achieved with the electrocautery. Copious irrigation was utilized and was repeatedly aspirated until clear.  The gallbladder and Endocatch sac were then removed through the umbilical port site.  The pursestring suture was used to close the umbilical fascia.    We again inspected the right  upper quadrant for hemostasis.  Pneumoperitoneum was released as  we removed the trocars.  4-0 Monocryl was used to close the skin.   Benzoin, steri-strips, and clean dressings were applied. The patient was then extubated and brought to the recovery room in stable condition. Instrument, sponge, and needle counts were correct at closure and at the conclusion of the case.   Findings: Chronic Cholecystitis with Cholelithiasis Unable to do a cholangiogram  Estimated Blood Loss: Minimal         Drains: 0         Specimens: Gallbladder           Complications: None; patient tolerated the procedure well.         Disposition: PACU - hemodynamically stable.         Condition: stable

## 2013-06-29 ENCOUNTER — Encounter (HOSPITAL_COMMUNITY): Payer: Self-pay | Admitting: Surgery

## 2013-06-30 ENCOUNTER — Ambulatory Visit (INDEPENDENT_AMBULATORY_CARE_PROVIDER_SITE_OTHER): Payer: BC Managed Care – PPO | Admitting: General Surgery

## 2013-06-30 ENCOUNTER — Encounter (INDEPENDENT_AMBULATORY_CARE_PROVIDER_SITE_OTHER): Payer: Self-pay | Admitting: General Surgery

## 2013-06-30 ENCOUNTER — Other Ambulatory Visit (INDEPENDENT_AMBULATORY_CARE_PROVIDER_SITE_OTHER): Payer: Self-pay | Admitting: General Surgery

## 2013-06-30 VITALS — BP 112/72 | HR 94 | Temp 97.6°F | Ht 61.0 in | Wt 177.6 lb

## 2013-06-30 DIAGNOSIS — G8918 Other acute postprocedural pain: Secondary | ICD-10-CM

## 2013-06-30 DIAGNOSIS — K8020 Calculus of gallbladder without cholecystitis without obstruction: Secondary | ICD-10-CM

## 2013-06-30 LAB — CBC WITH DIFFERENTIAL/PLATELET
Basophils Relative: 0 % (ref 0–1)
Eosinophils Absolute: 0.1 10*3/uL (ref 0.0–0.7)
Eosinophils Relative: 1 % (ref 0–5)
HCT: 38.4 % (ref 36.0–46.0)
Hemoglobin: 13.1 g/dL (ref 12.0–15.0)
MCH: 31.3 pg (ref 26.0–34.0)
MCHC: 34.1 g/dL (ref 30.0–36.0)
MCV: 91.6 fL (ref 78.0–100.0)
Monocytes Absolute: 0.7 10*3/uL (ref 0.1–1.0)
Monocytes Relative: 6 % (ref 3–12)
Neutro Abs: 7.4 10*3/uL (ref 1.7–7.7)

## 2013-06-30 LAB — COMPREHENSIVE METABOLIC PANEL
ALT: 21 U/L (ref 0–35)
AST: 19 U/L (ref 0–37)
Alkaline Phosphatase: 66 U/L (ref 39–117)
BUN: 4 mg/dL — ABNORMAL LOW (ref 6–23)
CO2: 27 mEq/L (ref 19–32)
Calcium: 10 mg/dL (ref 8.4–10.5)
Creat: 0.71 mg/dL (ref 0.50–1.10)
Sodium: 137 mEq/L (ref 135–145)
Total Bilirubin: 0.5 mg/dL (ref 0.3–1.2)

## 2013-06-30 NOTE — Patient Instructions (Signed)
Get bloodwork and CT scan/.  Take miralax.  Call tomorrow if no improvement.    We will let you know if labs are abnormal.

## 2013-07-01 ENCOUNTER — Ambulatory Visit
Admission: RE | Admit: 2013-07-01 | Discharge: 2013-07-01 | Disposition: A | Discharge status: Home / Self Care | Payer: Medicare Other | Source: Ambulatory Visit | Attending: General Surgery | Admitting: General Surgery

## 2013-07-01 ENCOUNTER — Telehealth (INDEPENDENT_AMBULATORY_CARE_PROVIDER_SITE_OTHER): Payer: Self-pay | Admitting: *Deleted

## 2013-07-01 DIAGNOSIS — G8918 Other acute postprocedural pain: Secondary | ICD-10-CM

## 2013-07-01 MED ORDER — IOHEXOL 300 MG/ML  SOLN
125.0000 mL | Freq: Once | INTRAMUSCULAR | Status: AC | PRN
  Administered 2013-07-01: 125 mL via INTRAVENOUS

## 2013-07-01 NOTE — Telephone Encounter (Signed)
LMOM for pts husband to return my call.  Pt is scheduled for CT at [facility] on 10/15 with an arrival time of 4:15pm.  No solid foods 4 hours prior.  Drink 1st bottle of contrast at 2:30 and 2nd bottle at 3:30.

## 2013-07-01 NOTE — Progress Notes (Signed)
HISTORY: Patient's 42 year old female who is approximately 5 days status post laparoscopic cholecystectomy.  She had a lot of pain the morning after surgery but this started to improve the next day or 2. However she has to her to have increasing nausea and vomiting. The epigastric pain and she is having is getting worse. She has not felt like eating hardly anything today and has noticed that her urinary output has gone down. She denies any jaundice or itching. She is not having bloating. She has had some difficulty having a bowel movement.    EXAM: General:  Alert and oriented.   Incision:  abd soft, non distended, approp tender at incisions, benign.     PATHOLOGY: Gallbladder - MILD CHRONIC CHOLECYSTITIS. - CHOLELITHIASIS PRESENT. - NO TUMOR SEEN.   ASSESSMENT AND PLAN:   Symptomatic cholelithiasis Patient having significant abdominal discomfort but benign abdominal exam.  Due to the level of discomfort and the fact that she is worsening, I am ordering CBC, CMET, and CT scan of abdomen and pelvis with by mouth and IV contrast.  She should follow with Dr. Magnus Ivan.        Maudry Diego, MD Surgical Oncology, General & Endocrine Surgery [facility], Esmeralda Links, MD Inez Catalina, MD

## 2013-07-01 NOTE — Assessment & Plan Note (Signed)
Patient having significant abdominal discomfort but benign abdominal exam.  Due to the level of discomfort and the fact that she is worsening, I am ordering CBC, CMET, and CT scan of abdomen and pelvis with by mouth and IV contrast.  She should follow with Dr. Magnus Ivan.

## 2013-07-02 ENCOUNTER — Telehealth (INDEPENDENT_AMBULATORY_CARE_PROVIDER_SITE_OTHER): Payer: Self-pay | Admitting: General Surgery

## 2013-07-02 ENCOUNTER — Telehealth (INDEPENDENT_AMBULATORY_CARE_PROVIDER_SITE_OTHER): Payer: Self-pay

## 2013-07-02 NOTE — Telephone Encounter (Signed)
I called Donna Davis back to let her know what the CT results was and she stated that she had not had a BM for 3 days , and she did not understand why she was hurting now, so I told her that she need to get her bowels moving that will make feel a lot better. Then she stated that she felt like she had a fever, but didn't have anything in her house to take her temp, she felt cold , and she still had the pain. I asked her if she had been around anybody who had been sick, she stated she did not know, so I offer her the urgent office, she stated that our office had to call the interpreter for her, so I called the number that we have in the system and they stated that they did not have anyone who could come out to the office today for her to be seen in the urgent office. I told her that she maybe coming down with a cold and that she may need to see her PCP, then she stated that she will wait to see Dr Magnus Ivan next week. I told her that there is someone always on call here at the office if she needs [place]. Then she stated on me about who I talked to her on the phone, that I kept saying to tell her ( in a third person) and I told her that I was sorry that I was just reading what we have in our Epic system for her that she was deaf and the system did not tell me that she had video phone with a interpreter, and I told her that my Husband is Deaf, and we don't have video phones here in the office. But I told her that if she needs [place] that we are here for her

## 2013-07-02 NOTE — Telephone Encounter (Signed)
Pt was seen in urge with Dr Donell Beers on Tuesday and she ordered Ct and labs on patient. She is calling for results. They have not been reviewed by Dr Donell Beers yet. I see that there is no abdominal abscess or fluid collection. Is there anything else I need to tell patient regarding results?

## 2013-07-02 NOTE — Telephone Encounter (Signed)
LMOM> please relay msg from Dr Magnus Ivan below when she calls back

## 2013-07-02 NOTE — Telephone Encounter (Signed)
i reviewed.  No abnormalities are seen.  Labs also normal.   If her pain persists for more than a day more, will have to get a HIDA scan to make sure there is not a bile leak. She needs to drink plenty of fluids

## 2013-07-06 ENCOUNTER — Telehealth (INDEPENDENT_AMBULATORY_CARE_PROVIDER_SITE_OTHER): Payer: Self-pay | Admitting: *Deleted

## 2013-07-06 NOTE — Telephone Encounter (Signed)
Patient called to ask for a refill of pain medication.  Patient states that she really wants to same prescription since it was helping with her pain.  Explained that Dr. Magnus Ivan will be in office tomorrow morning so I can send a message to him and Pattricia Boss to see what he finds to be appropriate for the patient then we can call her back to let her know.  Patient states understanding and agreeable with this plan at this time.

## 2013-07-07 ENCOUNTER — Telehealth (INDEPENDENT_AMBULATORY_CARE_PROVIDER_SITE_OTHER): Payer: Self-pay | Admitting: General Surgery

## 2013-07-07 NOTE — Telephone Encounter (Signed)
LMOM with the interpreter to let Mrs Dondlinger that she has a written Rx for Percocet 5/325 here at the office at the front desk

## 2013-07-08 ENCOUNTER — Telehealth (INDEPENDENT_AMBULATORY_CARE_PROVIDER_SITE_OTHER): Payer: Self-pay | Admitting: General Surgery

## 2013-07-08 NOTE — Telephone Encounter (Signed)
LMOM for patient to call back and ask for Donna Davis 

## 2013-07-09 ENCOUNTER — Encounter (INDEPENDENT_AMBULATORY_CARE_PROVIDER_SITE_OTHER): Payer: Self-pay | Admitting: General Surgery

## 2013-07-09 ENCOUNTER — Ambulatory Visit (INDEPENDENT_AMBULATORY_CARE_PROVIDER_SITE_OTHER): Payer: BC Managed Care – PPO | Admitting: Surgery

## 2013-07-09 ENCOUNTER — Encounter (INDEPENDENT_AMBULATORY_CARE_PROVIDER_SITE_OTHER): Payer: Self-pay | Admitting: Surgery

## 2013-07-09 VITALS — BP 120/76 | HR 72 | Temp 98.4°F | Resp 16 | Ht 60.0 in | Wt 176.0 lb

## 2013-07-09 DIAGNOSIS — Z09 Encounter for follow-up examination after completed treatment for conditions other than malignant neoplasm: Secondary | ICD-10-CM

## 2013-07-09 MED ORDER — HYDROCODONE-ACETAMINOPHEN 5-325 MG PO TABS: 1.0000 | ORAL_TABLET | Freq: Four times a day (QID) | ORAL | Status: DC | PRN

## 2013-07-09 NOTE — Progress Notes (Signed)
Subjective:     Patient ID: Donna Davis, female   DOB: February 21, 1971, 41 y.o.   MRN: [id number]  HPI She is here for another postoperative visit. Her discomfort is much less. She denies fevers. She is eating well and moving her bowels well  Review of Systems     Objective:   Physical Exam Her abdomen is soft and nontender. There is mild discomfort on the abdominal wall. Her CAT scan was unremarkable and her labs were normal    Assessment:     Patient stable postop     Plan:     She may resume normal activity. I renewed her hydrocodone. I will see her back as needed

## 2013-07-20 ENCOUNTER — Encounter: Payer: Self-pay | Admitting: Internal Medicine

## 2013-07-20 ENCOUNTER — Ambulatory Visit (INDEPENDENT_AMBULATORY_CARE_PROVIDER_SITE_OTHER): Payer: BC Managed Care – PPO | Admitting: Internal Medicine

## 2013-07-20 ENCOUNTER — Ambulatory Visit (HOSPITAL_COMMUNITY)
Admission: RE | Admit: 2013-07-20 | Discharge: 2013-07-20 | Disposition: A | Discharge status: Home / Self Care | Payer: BC Managed Care – PPO | Source: Ambulatory Visit | Attending: Internal Medicine | Admitting: Internal Medicine

## 2013-07-20 VITALS — BP 134/81 | HR 94 | Temp 98.1°F | Ht 61.5 in | Wt 180.5 lb

## 2013-07-20 DIAGNOSIS — Z9889 Other specified postprocedural states: Secondary | ICD-10-CM

## 2013-07-20 DIAGNOSIS — J209 Acute bronchitis, unspecified: Secondary | ICD-10-CM | POA: Insufficient documentation

## 2013-07-20 DIAGNOSIS — R0602 Shortness of breath: Secondary | ICD-10-CM | POA: Insufficient documentation

## 2013-07-20 DIAGNOSIS — R079 Chest pain, unspecified: Secondary | ICD-10-CM | POA: Insufficient documentation

## 2013-07-20 DIAGNOSIS — E039 Hypothyroidism, unspecified: Secondary | ICD-10-CM

## 2013-07-20 DIAGNOSIS — K8020 Calculus of gallbladder without cholecystitis without obstruction: Secondary | ICD-10-CM

## 2013-07-20 DIAGNOSIS — N643 Galactorrhea not associated with childbirth: Secondary | ICD-10-CM

## 2013-07-20 DIAGNOSIS — Z0000 Encounter for general adult medical examination without abnormal findings: Secondary | ICD-10-CM

## 2013-07-20 DIAGNOSIS — G43909 Migraine, unspecified, not intractable, without status migrainosus: Secondary | ICD-10-CM

## 2013-07-20 MED ORDER — AZITHROMYCIN 250 MG PO TABS: ORAL_TABLET | ORAL | Status: DC

## 2013-07-20 MED ORDER — PHENYLEPHRINE-CHLORPHEN-DM 3.5-1-3 MG/ML PO LIQD
1.0000 mL | Freq: Four times a day (QID) | ORAL | Status: DC | PRN
Start: 2013-07-20 — End: 2013-09-29

## 2013-07-20 MED ORDER — ALBUTEROL SULFATE HFA 108 (90 BASE) MCG/ACT IN AERS: 2.0000 | INHALATION_SPRAY | Freq: Four times a day (QID) | RESPIRATORY_TRACT | Status: DC | PRN

## 2013-07-20 NOTE — Progress Notes (Signed)
  Subjective:    Patient ID: Donna Davis, female    DOB: May 01, 1971, 42 y.o.   MRN: [id number]  CC:   HPI  Donna Davis is a 42yo woman with PMH of migraines, GERD, hypothyroidism, galactorrhea and recent cholecystectomy who presents for follow up.    Donna Davis reports a cough X 3 weeks.  Reports subjective fever at home, taking tylenol, afebrile in clinic.  She reports that she always gets bronchitis after surgery due to intubation.  Yellow and dark green.  No sinus pressure, eye pain. + rhinorrhea.  + wheezing and worse coughing with taking deep breaths.  No chest pain.  Has been treated in the past with tessalon perles, albuterol and zpack in the past.    Her Headaches vary.  Right now with one and due to cough and problems sleeping.  Stopped topomax due to "fuzzy" feeling and difficulty concentrating.  Her headaches do not seem to be severe, but she is not sure at the moment due to her recent illness.  She is not interested in trying anything new at this time.  She would like to discuss later.   BAck on her old birth control pill, she reports trying the estrogen only tablet for about 3 weeks, then going back on her old one.  No more galactorrhea noted.   A little sore after surgery, but wounds are healing well.    Not smoking.    Review of Systems  Constitutional: Positive for fever (subjective). Negative for chills and fatigue.  HENT: Positive for rhinorrhea. Negative for ear discharge, ear pain, sinus pressure, sneezing and sore throat.   Eyes: Negative for pain and redness.  Respiratory: Positive for cough (with some scant sputum), shortness of breath (with cough) and wheezing.   Cardiovascular: Negative for chest pain and leg swelling.  Gastrointestinal: Positive for abdominal pain (with some movements due to healing scars). Negative for diarrhea and constipation.  Genitourinary: Negative for dysuria and difficulty urinating.  Musculoskeletal: Negative for back pain, gait problem and  myalgias.  Skin: Negative for rash and wound.  Neurological: Positive for headaches.       Objective:     Physical Exam  Vitals reviewed. Constitutional: She is oriented to person, place, and time. She appears well-developed and well-nourished. No distress.  Coughing, dry  HENT:  Head: Normocephalic and atraumatic.  Mouth/Throat: No oropharyngeal exudate.  Eyes: Conjunctivae are normal. No scleral icterus.  Neck: Neck supple.  Cardiovascular: Normal rate, regular rhythm and normal heart sounds.   No murmur heard. Pulmonary/Chest: Effort normal.  No apparent rales or rhonchi, no wheezing noted.  Significant coughing with taking a deep breath.  Abdominal: Soft. Bowel sounds are normal.  Surgical wounds healing well  Musculoskeletal: She exhibits no edema and no tenderness.  Lymphadenopathy:    She has no cervical adenopathy.  Neurological: She is alert and oriented to person, place, and time.  Skin: Skin is warm and dry. No erythema.  Psychiatric: She has a normal mood and affect. Her behavior is normal.    No labs today, consider checking TSH at next visit.   CXR today      Assessment & Plan:  RTC in 2-3 months.

## 2013-07-20 NOTE — Assessment & Plan Note (Signed)
Symptoms vary.  She is not interested in further prophylactic therapy at this time, will discuss at next visit.  Next option would be valproate as she has not tolerated amitriptyline, propranolol or topomax.

## 2013-07-20 NOTE — Addendum Note (Signed)
Addended by: Debe Coder B on: 07/20/2013 09:21 AM   Modules accepted: Orders

## 2013-07-20 NOTE — Assessment & Plan Note (Signed)
TSH WNL in May of this year, check at next visit for follow up.

## 2013-07-20 NOTE — Patient Instructions (Signed)
Donna Davis - You likely have a mild case of bronchitis.  I will send you home with Azithromycin (Zpack) and an albuterol inhaler.  Please take over the counter cough medicine (Mucinex) as needed.  You will also have a chest xray to make sure you are not developing pneumonia.   Please scheduled a follow up visit in the next 2-3 months after you have recovered from the bronchitis.   Please get your flu shot either at school or at [facility] and send me an email to let me know you had it done.   Please call with any questions.    Bronchitis Bronchitis is the body's way of reacting to injury and/or infection (inflammation) of the bronchi. Bronchi are the air tubes that extend from the windpipe into the lungs. If the inflammation becomes severe, it may cause shortness of breath. CAUSES  Inflammation may be caused by:  A virus.  Germs (bacteria).  Dust.  Allergens.  Pollutants and many other irritants. The cells lining the bronchial tree are covered with tiny hairs (cilia). These constantly beat upward, away from the lungs, toward the mouth. This keeps the lungs free of pollutants. When these cells become too irritated and are unable to do their job, mucus begins to develop. This causes the characteristic cough of bronchitis. The cough clears the lungs when the cilia are unable to do their job. Without either of these protective mechanisms, the mucus would settle in the lungs. Then you would develop pneumonia. Smoking is a common cause of bronchitis and can contribute to pneumonia. Stopping this habit is the single most important thing you can do to help yourself. TREATMENT   Your caregiver may prescribe an antibiotic if the cough is caused by bacteria. Also, medicines that open up your airways make it easier to breathe. Your caregiver may also recommend or prescribe an expectorant. It will loosen the mucus to be coughed up. Only take over-the-counter or prescription medicines for pain, discomfort, or  fever as directed by your caregiver.  Removing whatever causes the problem (smoking, for example) is critical to preventing the problem from getting worse.  Cough suppressants may be prescribed for relief of cough symptoms.  Inhaled medicines may be prescribed to help with symptoms now and to help prevent problems from returning.  For those with recurrent (chronic) bronchitis, there may be a need for steroid medicines. SEEK IMMEDIATE MEDICAL CARE IF:   During treatment, you develop more pus-like mucus (purulent sputum).  You have a fever.  Your baby is older than 3 months with a rectal temperature of 102 F (38.9 C) or higher.  Your baby is 35 months old or younger with a rectal temperature of 100.4 F (38 C) or higher.  You become progressively more ill.  You have increased difficulty breathing, wheezing, or shortness of breath. It is necessary to seek immediate medical care if you are elderly or sick from any other disease. MAKE SURE YOU:   Understand these instructions.  Will watch your condition.  Will get help right away if you are not doing well or get worse. Document Released: 09/03/2005 Document Revised: 11/26/2011 Document Reviewed: 07/13/2008 [facility] Patient Information 2014 [place], [place].

## 2013-07-20 NOTE — Assessment & Plan Note (Signed)
Due to prolonged nature of the cough and sputum production, will obtain CXR and treat with Zpack, albuterol and cough medicine.   She is to call the clinic if not improving in the next week.

## 2013-07-20 NOTE — Assessment & Plan Note (Signed)
Reported as resolved.

## 2013-07-20 NOTE — Assessment & Plan Note (Signed)
MMG - Normal this year, 2014 Pelvic - hysterectomy, no cervix.  Sees OBGYN Flu - due - defer until after Abx.   DEXA - at 42yo

## 2013-07-20 NOTE — Assessment & Plan Note (Signed)
S/P surgical intervention.  Doing well post op.  Surgical sites clean and dry.

## 2013-07-21 ENCOUNTER — Encounter: Payer: Self-pay | Admitting: Internal Medicine

## 2013-07-21 MED ORDER — HYDROCOD POLST-CHLORPHEN POLST 10-8 MG/5ML PO LQCR: 5.0000 mL | Freq: Two times a day (BID) | ORAL | Status: DC | PRN

## 2013-07-22 MED ORDER — HYDROCOD POLST-CHLORPHEN POLST 10-8 MG/5ML PO LQCR: 5.0000 mL | Freq: Two times a day (BID) | ORAL | Status: DC | PRN

## 2013-07-22 NOTE — Addendum Note (Signed)
Addended by: Aletta Edouard on: 07/22/2013 11:59 AM   Modules accepted: Orders

## 2013-07-23 ENCOUNTER — Other Ambulatory Visit: Payer: Self-pay

## 2013-09-18 ENCOUNTER — Telehealth: Payer: Self-pay | Admitting: *Deleted

## 2013-09-18 ENCOUNTER — Other Ambulatory Visit: Payer: Self-pay | Admitting: Internal Medicine

## 2013-09-18 MED ORDER — NORETHIN-ETH ESTRAD-FE BIPHAS 1 MG-10 MCG / 10 MCG PO TABS: 1.0000 | ORAL_TABLET | Freq: Every day | ORAL | Status: DC

## 2013-09-18 NOTE — Telephone Encounter (Signed)
I filled it, can you fax please?    Thanks

## 2013-09-18 NOTE — Telephone Encounter (Signed)
Pt request a one month supply.  I added the Christie.

## 2013-09-21 NOTE — Telephone Encounter (Signed)
Rx faxed

## 2013-09-24 ENCOUNTER — Telehealth: Payer: Self-pay | Admitting: *Deleted

## 2013-09-24 DIAGNOSIS — N643 Galactorrhea not associated with childbirth: Secondary | ICD-10-CM

## 2013-09-24 DIAGNOSIS — N951 Menopausal and female climacteric states: Secondary | ICD-10-CM

## 2013-09-24 MED ORDER — NORETHIN-ETH ESTRAD-FE BIPHAS 1 MG-10 MCG / 10 MCG PO TABS: 1.0000 | ORAL_TABLET | Freq: Every day | ORAL | Status: DC

## 2013-09-24 NOTE — Telephone Encounter (Signed)
Florenda called thru [employer] and left a message she needs a refill on her loloestrin. She states that [facility] has called and faxed , but  No one has answered.  She states [facility] can't fill this prescription and she only has 2 weeks left and doesn't want to run out.   Requests a call back and may leave a message.  Per chart review lolestrin was approved by Dr. Harolyn Rutherford in July, and recently order sent by [place]  To [facility] for one month- but per Ramonica unable to be filled there. Called [place] thru [employer] and left message we got her message and were calling back, and will [facility]ill [facility]. Call back if any other issues.

## 2013-09-28 ENCOUNTER — Telehealth: Payer: Self-pay | Admitting: *Deleted

## 2013-09-28 DIAGNOSIS — N951 Menopausal and female climacteric states: Secondary | ICD-10-CM

## 2013-09-28 MED ORDER — NORETHIN-ETH ESTRAD-FE BIPHAS 1 MG-10 MCG / 10 MCG PO TABS: 1.0000 | ORAL_TABLET | Freq: Every day | ORAL | Status: DC

## 2013-09-28 NOTE — Telephone Encounter (Signed)
Patient called and left message that her medicine needs to be called in to [facility]. Per note in epic it was [facility], she would like it to go to [facility] instead. She can't afford it at [facility]. Rx resent to [facility].

## 2013-09-29 ENCOUNTER — Emergency Department (HOSPITAL_COMMUNITY): Payer: Medicare Other

## 2013-09-29 ENCOUNTER — Encounter (HOSPITAL_COMMUNITY): Payer: Self-pay | Admitting: Emergency Medicine

## 2013-09-29 ENCOUNTER — Emergency Department (HOSPITAL_COMMUNITY)
Admission: EM | Admit: 2013-09-29 | Discharge: 2013-09-29 | Disposition: A | Discharge status: Home / Self Care | Payer: Medicare Other | Attending: Emergency Medicine | Admitting: Emergency Medicine

## 2013-09-29 DIAGNOSIS — Z8619 Personal history of other infectious and parasitic diseases: Secondary | ICD-10-CM | POA: Insufficient documentation

## 2013-09-29 DIAGNOSIS — Z79899 Other long term (current) drug therapy: Secondary | ICD-10-CM | POA: Insufficient documentation

## 2013-09-29 DIAGNOSIS — Z8719 Personal history of other diseases of the digestive system: Secondary | ICD-10-CM | POA: Insufficient documentation

## 2013-09-29 DIAGNOSIS — R110 Nausea: Secondary | ICD-10-CM | POA: Insufficient documentation

## 2013-09-29 DIAGNOSIS — Z8659 Personal history of other mental and behavioral disorders: Secondary | ICD-10-CM | POA: Insufficient documentation

## 2013-09-29 DIAGNOSIS — Z880 Allergy status to penicillin: Secondary | ICD-10-CM | POA: Insufficient documentation

## 2013-09-29 DIAGNOSIS — E039 Hypothyroidism, unspecified: Secondary | ICD-10-CM | POA: Insufficient documentation

## 2013-09-29 DIAGNOSIS — R350 Frequency of micturition: Secondary | ICD-10-CM | POA: Insufficient documentation

## 2013-09-29 DIAGNOSIS — N949 Unspecified condition associated with female genital organs and menstrual cycle: Secondary | ICD-10-CM | POA: Diagnosis not present

## 2013-09-29 DIAGNOSIS — R1031 Right lower quadrant pain: Secondary | ICD-10-CM | POA: Diagnosis not present

## 2013-09-29 DIAGNOSIS — Z87891 Personal history of nicotine dependence: Secondary | ICD-10-CM | POA: Insufficient documentation

## 2013-09-29 DIAGNOSIS — R109 Unspecified abdominal pain: Secondary | ICD-10-CM | POA: Diagnosis not present

## 2013-09-29 DIAGNOSIS — M549 Dorsalgia, unspecified: Secondary | ICD-10-CM | POA: Insufficient documentation

## 2013-09-29 DIAGNOSIS — Z3202 Encounter for pregnancy test, result negative: Secondary | ICD-10-CM | POA: Insufficient documentation

## 2013-09-29 DIAGNOSIS — R509 Fever, unspecified: Secondary | ICD-10-CM | POA: Insufficient documentation

## 2013-09-29 DIAGNOSIS — Z9889 Other specified postprocedural states: Secondary | ICD-10-CM | POA: Diagnosis not present

## 2013-09-29 DIAGNOSIS — Z9089 Acquired absence of other organs: Secondary | ICD-10-CM | POA: Insufficient documentation

## 2013-09-29 DIAGNOSIS — H9190 Unspecified hearing loss, unspecified ear: Secondary | ICD-10-CM | POA: Insufficient documentation

## 2013-09-29 DIAGNOSIS — Z90710 Acquired absence of both cervix and uterus: Secondary | ICD-10-CM | POA: Insufficient documentation

## 2013-09-29 LAB — CBC WITH DIFFERENTIAL/PLATELET
Basophils Absolute: 0 10*3/uL (ref 0.0–0.1)
Basophils Relative: 0 % (ref 0–1)
EOS PCT: 2 % (ref 0–5)
Eosinophils Absolute: 0.2 10*3/uL (ref 0.0–0.7)
HCT: 40.1 % (ref 36.0–46.0)
HEMOGLOBIN: 13.8 g/dL (ref 12.0–15.0)
LYMPHS ABS: 4 10*3/uL (ref 0.7–4.0)
LYMPHS PCT: 39 % (ref 12–46)
MCH: 31.3 pg (ref 26.0–34.0)
MCHC: 34.4 g/dL (ref 30.0–36.0)
MCV: 90.9 fL (ref 78.0–100.0)
MONO ABS: 0.5 10*3/uL (ref 0.1–1.0)
MONOS PCT: 5 % (ref 3–12)
Neutro Abs: 5.4 10*3/uL (ref 1.7–7.7)
Neutrophils Relative %: 53 % (ref 43–77)
Platelets: 294 10*3/uL (ref 150–400)
RBC: 4.41 MIL/uL (ref 3.87–5.11)
RDW: 12.8 % (ref 11.5–15.5)
WBC: 10.1 10*3/uL (ref 4.0–10.5)

## 2013-09-29 LAB — URINALYSIS, ROUTINE W REFLEX MICROSCOPIC
Bilirubin Urine: NEGATIVE
Glucose, UA: NEGATIVE mg/dL
HGB URINE DIPSTICK: NEGATIVE
KETONES UR: NEGATIVE mg/dL
LEUKOCYTES UA: NEGATIVE
Nitrite: NEGATIVE
PROTEIN: NEGATIVE mg/dL
Specific Gravity, Urine: 1.009 (ref 1.005–1.030)
UROBILINOGEN UA: 0.2 mg/dL (ref 0.0–1.0)
pH: 7.5 (ref 5.0–8.0)

## 2013-09-29 LAB — COMPREHENSIVE METABOLIC PANEL
ALT: 14 U/L (ref 0–35)
AST: 16 U/L (ref 0–37)
Albumin: 4 g/dL (ref 3.5–5.2)
Alkaline Phosphatase: 56 U/L (ref 39–117)
BILIRUBIN TOTAL: 0.2 mg/dL — AB (ref 0.3–1.2)
BUN: 14 mg/dL (ref 6–23)
CHLORIDE: 102 meq/L (ref 96–112)
CO2: 25 meq/L (ref 19–32)
CREATININE: 0.79 mg/dL (ref 0.50–1.10)
Calcium: 9.9 mg/dL (ref 8.4–10.5)
GLUCOSE: 84 mg/dL (ref 70–99)
Potassium: 4.1 mEq/L (ref 3.7–5.3)
Sodium: 141 mEq/L (ref 137–147)
Total Protein: 7.6 g/dL (ref 6.0–8.3)

## 2013-09-29 LAB — PREGNANCY, URINE: Preg Test, Ur: NEGATIVE

## 2013-09-29 MED ORDER — ONDANSETRON HCL 4 MG/2ML IJ SOLN
4.0000 mg | Freq: Once | INTRAMUSCULAR | Status: AC
  Administered 2013-09-29: 4 mg via INTRAVENOUS
  Filled 2013-09-29: qty 2

## 2013-09-29 MED ORDER — IOHEXOL 300 MG/ML  SOLN
50.0000 mL | Freq: Once | INTRAMUSCULAR | Status: AC | PRN
  Administered 2013-09-29: 50 mL via ORAL

## 2013-09-29 MED ORDER — ONDANSETRON 4 MG PO TBDP: 4.0000 mg | ORAL_TABLET | Freq: Three times a day (TID) | ORAL | Status: DC | PRN

## 2013-09-29 MED ORDER — SODIUM CHLORIDE 0.9 % IV SOLN
Freq: Once | INTRAVENOUS | Status: AC
  Administered 2013-09-29: 20:00:00 via INTRAVENOUS

## 2013-09-29 MED ORDER — OXYCODONE-ACETAMINOPHEN 5-325 MG PO TABS: 2.0000 | ORAL_TABLET | ORAL | Status: DC | PRN

## 2013-09-29 MED ORDER — PROMETHAZINE HCL 25 MG RE SUPP: 25.0000 mg | Freq: Four times a day (QID) | RECTAL | Status: DC | PRN

## 2013-09-29 MED ORDER — IOHEXOL 300 MG/ML  SOLN
100.0000 mL | Freq: Once | INTRAMUSCULAR | Status: AC | PRN
  Administered 2013-09-29: 100 mL via INTRAVENOUS

## 2013-09-29 MED ORDER — HYDROMORPHONE HCL PF 1 MG/ML IJ SOLN
1.0000 mg | Freq: Once | INTRAMUSCULAR | Status: AC
  Administered 2013-09-29: 1 mg via INTRAVENOUS
  Filled 2013-09-29: qty 1

## 2013-09-29 MED ORDER — MORPHINE SULFATE 4 MG/ML IJ SOLN
4.0000 mg | Freq: Once | INTRAMUSCULAR | Status: DC
Start: 2013-09-29 — End: 2013-09-29

## 2013-09-29 NOTE — ED Notes (Signed)
Pt sts in the case she has appendicitis and will need surgery she wants to have interpretor, otherwise she does not need one.

## 2013-09-29 NOTE — Discharge Instructions (Signed)
Call for a follow up appointment with a Family or Primary Care Provider.  °Return if Symptoms worsen.   °Take medication as prescribed.  ° °

## 2013-09-29 NOTE — ED Provider Notes (Signed)
CSN: [id number]     Arrival date & time 09/29/13  1746 History   First MD Initiated Contact with Patient 09/29/13 1821     Chief Complaint  Patient presents with  . Abdominal Pain   (Consider location/radiation/quality/duration/timing/severity/associated sxs/prior Treatment) HPI Comments: Donna Davis is a 43 year-old female with a past medical history of hearing impairment, GERD, IBS, presenting the Emergency Department with a chief complaint of worsening RLQ pain for 1 week.  She reports intermittent sharp pain with movement for 1 week lasting 5 minutes that self resolved. She reports an abrupt onset of constant pain today while walking on [employer] campus.  She reports 10/10 constant pain and pressure of her RLQ with radiation into her back.  She reports nausea due to pain without emesis.  She denies constipation, chronic diarrhea since colectomy (06/26/2013).  Last BM this morning without blood or pus.  She reports subjective fevers over the past 3 days.  She reports increase in urinary frequency today denies dysuria or hematuria.  She denies vaginal bleeding or abnormal discharge.  Abdominal surgeries: Hysterectomy, Left  oophorectomy, C-section, Ovarian cystectomy, removal of bdominal adhesion, cholecystectomy.  LMP; 8 years ago PCP: Gilles Chiquito, MD    Patient is a 43 y.o. female presenting with abdominal pain. The history is provided by the patient and medical records. No language interpreter was used.  Abdominal Pain Associated symptoms: chills, fever and nausea   Associated symptoms: no constipation, no diarrhea, no hematuria and no vomiting     Past Medical History  Diagnosis Date  . GERD (gastroesophageal reflux disease)   . Hypothyroidism   . IBS (irritable bowel syndrome)   . Anxiety   . Hearing difficulty     b/l, 2/2 congenital rubella s/p stapedectomy. Using hearing aid  . Headache(784.0) 04/2005    h/o headache in past that raised question of psuedotumor s/p therapeutic  LP with an opening preassure of 18 cm H2O   . Hepatic cyst     6 mm on CT done done in 05/2005  . Family history of malignant neoplasm of gastrointestinal tract   . Shingles 03/2013   Past Surgical History  Procedure Laterality Date  . Abdominal hysterectomy      partial hysterecotmy 2003 for endoemtriosis with residula right ovaey. Multiple GYN surgeries for chronic pelvic pain  . Cesarean section  1994  . Stapedectomy  2007    Dr. Lucia Gaskins, left ear  . Laparoscopic ovarian cystectomy  2001  . Thyroid nodule removed  1997  . Total abdominal hysterectomy w/ bilateral salpingoophorectomy      left  . Abdominal adhesion surgery      removal from bowel  . Joint replacement    . Cholecystectomy N/A 06/26/2013    Procedure: LAPAROSCOPIC CHOLECYSTECTOMY WITH ATTEMPTED INTRAOPERATIVE CHOLANGIOGRAM;  Surgeon: Harl Bowie, MD;  Location: [facility];  Service: General;  Laterality: N/A;   Family History  Problem Relation Age of Onset  . Breast cancer Mother   . Ovarian cancer Mother   . Diabetes Mother   . Stomach cancer Maternal Grandmother   . Colon cancer Maternal Grandmother   . Diabetes Maternal Grandmother   . Pancreatic cancer Father   . Prostate cancer Father   . Colon cancer Father   . Diabetes Father   . Heart disease Father   . Irritable bowel syndrome Father   . Kidney disease Father   . Heart disease      both grandmothers  . Bipolar disorder  Daughter    History  Substance Use Topics  . Smoking status: Former Smoker    Types: Cigarettes  . Smokeless tobacco: Never Used     Comment: quit 1yrs ago  . Alcohol Use: No   OB History   Grav Para Term Preterm Abortions TAB SAB Ect Mult Living   3 2 2  1 1    2      Review of Systems  Constitutional: Positive for fever and chills.  Gastrointestinal: Positive for nausea and abdominal pain. Negative for vomiting, diarrhea, constipation, blood in stool and abdominal distention.  Genitourinary: Positive for frequency.  Negative for urgency and hematuria.  Musculoskeletal: Positive for back pain.    Allergies  Onion; Shellfish allergy; Amitriptyline; Amoxicillin-pot clavulanate; Cefuroxime; Cephalexin; Ciprofloxacin; Clarithromycin; Doxycycline; Prednisone; Propranolol; Shrimp flavor; Sulfonamide derivatives; and Benadryl  Home Medications   Current Outpatient Rx  Name  Route  Sig  Dispense  Refill  . ascorbic acid (VITAMIN C) 1000 MG tablet   Oral   Take 1,000 mg by mouth 2 (two) times daily.         Marland Kitchen levothyroxine (SYNTHROID, LEVOTHROID) 25 MCG tablet   Oral   Take 1 tablet (25 mcg total) by mouth daily.   31 tablet   11   . Norethindrone-Ethinyl Estradiol-Fe Biphas (LO LOESTRIN FE) 1 MG-10 MCG / 10 MCG tablet   Oral   Take 1 tablet by mouth daily.   1 Package   3   . Omega-3 Fatty Acids (FISH OIL) 1000 MG CAPS   Oral   Take 2,000 mg by mouth 2 (two) times daily.         . ondansetron (ZOFRAN ODT) 4 MG disintegrating tablet   Oral   Take 1 tablet (4 mg total) by mouth every 8 (eight) hours as needed for nausea or vomiting.   10 tablet   0   . oxyCODONE-acetaminophen (PERCOCET/ROXICET) 5-325 MG per tablet   Oral   Take 2 tablets by mouth every 4 (four) hours as needed for severe pain. With food.   10 tablet   0   . promethazine (PHENERGAN) 25 MG suppository   Rectal   Place 1 suppository (25 mg total) rectally every 6 (six) hours as needed for nausea or vomiting.   12 each   0    BP 105/51  Pulse 74  Temp(Src) 98.2 F (36.8 C) (Oral)  Resp 18  SpO2 96%  LMP 12/26/2004 Physical Exam  Nursing note and vitals reviewed. Constitutional: She is oriented to person, place, and time. She appears well-developed and well-nourished.  Appears uncomfortable.  HENT:  Head: Normocephalic and atraumatic.  Eyes: Pupils are equal, round, and reactive to light. No scleral icterus.  Neck: Neck supple.  Cardiovascular: Normal rate and regular rhythm.   Pulmonary/Chest: Effort normal  and breath sounds normal. No respiratory distress. She has no wheezes. She has no rales.  Abdominal: Soft. Bowel sounds are increased. There is tenderness in the right lower quadrant. There is rebound, guarding and tenderness at McBurney's point. There is negative Murphy's sign.  Positive psoas. Positive Rovsing's sign. Moderate to severe tenderness RLQ.  Genitourinary: Right adnexum displays tenderness. Left adnexum displays no tenderness.  Mild Right adnexa tenderness.  Neurological: She is alert and oriented to person, place, and time.  Skin: Skin is warm and dry.  Psychiatric: She has a normal mood and affect.    ED Course  Procedures (including critical care time) Labs Review Labs Reviewed  COMPREHENSIVE METABOLIC  PANEL - Abnormal; Notable for the following:    Total Bilirubin 0.2 (*)    All other components within normal limits  CBC WITH DIFFERENTIAL  PREGNANCY, URINE  URINALYSIS, ROUTINE W REFLEX MICROSCOPIC   Imaging Review US Transvaginal Non-ob  09/29/2013   CLINICAL DATA:  Rule out torsion.  Right lower quadrant pain.  EXAM: TRANSABDOMINAL AND TRANSVAGINAL ULTRASOUND OF PELVIS  TECHNIQUE: Both transabdominal and transvaginal ultrasound examinations of the pelvis were performed. Transabdominal technique was performed for global imaging of the pelvis including uterus, ovaries, adnexal regions, and pelvic cul-de-sac. It was necessary to proceed with endovaginal exam following the transabdominal exam to visualize the ovaries.  COMPARISON:  10/03/2012  FINDINGS: Uterus  Surgically absent.  Right ovary  Not visualized. It is reassuring that there was no enlarged ovary or adnexal mass on the preceding CT.  Left ovary  Surgically absent  Other findings  No free fluid.  IMPRESSION: 1. Nonvisualization of the right ovary. It is reassuring that there was no ovarian enlargement on preceding CT. 2. Surgically absent uterus and left ovary.   Electronically Signed   By: Jorje Guild M.D.   On:  09/29/2013 22:25   US Pelvis Complete  09/29/2013   CLINICAL DATA:  Rule out torsion.  Right lower quadrant pain.  EXAM: TRANSABDOMINAL AND TRANSVAGINAL ULTRASOUND OF PELVIS  TECHNIQUE: Both transabdominal and transvaginal ultrasound examinations of the pelvis were performed. Transabdominal technique was performed for global imaging of the pelvis including uterus, ovaries, adnexal regions, and pelvic cul-de-sac. It was necessary to proceed with endovaginal exam following the transabdominal exam to visualize the ovaries.  COMPARISON:  10/03/2012  FINDINGS: Uterus  Surgically absent.  Right ovary  Not visualized. It is reassuring that there was no enlarged ovary or adnexal mass on the preceding CT.  Left ovary  Surgically absent  Other findings  No free fluid.  IMPRESSION: 1. Nonvisualization of the right ovary. It is reassuring that there was no ovarian enlargement on preceding CT. 2. Surgically absent uterus and left ovary.   Electronically Signed   By: Jorje Guild M.D.   On: 09/29/2013 22:25   Ct Abdomen Pelvis W Contrast  09/29/2013   CLINICAL DATA:  Right lower quadrant abdominal pain which began approximately 1 week ago, much worse today. Prior history of ovarian cyst. Surgical history includes abdominal hysterectomy and bilateral salpingo-oophorectomy, cholecystectomy.  EXAM: CT ABDOMEN AND PELVIS WITH CONTRAST  TECHNIQUE: Multidetector CT imaging of the abdomen and pelvis was performed using the standard protocol following bolus administration of intravenous contrast.  CONTRAST:  126mL OMNIPAQUE IOHEXOL 300 MG/ML IV. Oral contrast was also administered.  COMPARISON:  06/21/2013, 03/14/2013, 10/23/2012, 12/26/2007.  FINDINGS: Normal-appearing appendix in the mid pelvis, adjacent to the urinary bladder. Moderate stool burden throughout normal appearing colon. Normal appearing stomach. Diverticulum arising from the medial descending duodenum. Small bowel otherwise normal in appearance. No ascites.   Normal appearing liver, spleen, pancreas, adrenal glands, and kidneys. Gallbladder surgically absent. No unexpected biliary ductal dilation. Bile duct enters the duodenum at or very near the diverticulum. No visible aortoiliofemoral atherosclerosis. Widely patent visceral arteries. No significant lymphadenopathy.  Uterus and ovaries surgically absent. No adnexal masses or free pelvic fluid. Urinary bladder well distended and normal in appearance. Very small phleboliths low in both sides of the pelvis.  Bone window images unremarkable. Visualized lung bases clear apart from the expected dependent atelectasis posteriorly. Heart size upper normal.  IMPRESSION: 1. No acute abnormalities involving the abdomen or pelvis. Moderate  stool burden. 2. Diverticulum arising from the medial descending duodenum, at or near the insertion of the common bile duct. No biliary ductal dilation.   Electronically Signed   By: Evangeline Dakin M.D.   On: 09/29/2013 20:52    EKG Interpretation   None       MDM   1. Right lower quadrant abdominal pain   Pt with RLQ pain worsening over the past day.Will rule out appendicitis, if CT negative with [place] for ovarian torsion. Re-eval: Patient reports pain improvement but reports pain 8/10. Re-eval: Re-eval patient reports symptoms improvement. Discussed CT findings and plan for [place]. US-unable to find right ovary. Discussed patient history, condition, and labs with Dr. Regenia Skeeter. Dr. Regenia Skeeter consulted OB/GYN for recommendations given right ovary was not identified on CT or [place], recommenced follow up out-patient.  Discussed lab results, imaging results, and treatment plan with the patient. Return precautions given. Reports understanding and no other concerns at this time.  Patient is stable for discharge at this time.   Meds given in ED:  Medications  ondansetron (ZOFRAN) injection 4 mg (4 mg Intravenous Given 09/29/13 1941)  HYDROmorphone (DILAUDID) injection 1 mg (1 mg Intravenous  Given 09/29/13 1941)  0.9 %  sodium chloride infusion ( Intravenous Stopped 09/29/13 2339)  iohexol (OMNIPAQUE) 300 MG/ML solution 50 mL (50 mLs Oral Contrast Given 09/29/13 1940)  HYDROmorphone (DILAUDID) injection 1 mg (1 mg Intravenous Given 09/29/13 2003)  iohexol (OMNIPAQUE) 300 MG/ML solution 100 mL (100 mLs Intravenous Contrast Given 09/29/13 2032)  HYDROmorphone (DILAUDID) injection 1 mg (1 mg Intravenous Given 09/29/13 2259)  ondansetron (ZOFRAN) injection 4 mg (4 mg Intravenous Given 09/29/13 2303)    Discharge Medication List as of 09/29/2013 11:25 PM    START taking these medications   Details  ondansetron (ZOFRAN ODT) 4 MG disintegrating tablet Take 1 tablet (4 mg total) by mouth every 8 (eight) hours as needed for nausea or vomiting., Starting 09/29/2013, Until Discontinued, Print    oxyCODONE-acetaminophen (PERCOCET/ROXICET) 5-325 MG per tablet Take 2 tablets by mouth every 4 (four) hours as needed for severe pain. With food., Starting 09/29/2013, Until Discontinued, Print    promethazine (PHENERGAN) 25 MG suppository Place 1 suppository (25 mg total) rectally every 6 (six) hours as needed for nausea or vomiting., Starting 09/29/2013, Until Discontinued, Print          Lorrine Kin, PA-C 09/30/13 2106

## 2013-09-29 NOTE — ED Notes (Signed)
Pt reports right lower abdominal pain, sts it's been going on for about a week, today however it got much worse, sts it's stabbing pain that radiates to her leg. Pt sts unsure if it's ovarian cyst, sts has hx of ovarian cysts and only has partial right ovary after hysterectomy. Sts left ovary surgically removed d/t recurrent cysts. Pt is deaf, can read lips.

## 2013-10-02 NOTE — ED Provider Notes (Signed)
Medical screening examination/treatment/procedure(s) were conducted as a shared visit with non-physician practitioner(s) and myself.  I personally evaluated the patient during the encounter.  EKG Interpretation   None       Patient with acute worsening of RLQ pain. Improved with pain control here. No appy. U/s unable to find her right ovary, which patient confirms has not been removed. I discussed the [facility] Dr Gala Romney, who is reassured by not finding the ovary as it is unlikely to be torsion if it is not large. Not seen on CT either. Patient's pain has improved here, and with all this, torsion is felt to be less likely. Will encourage her to f/u with GYN as soon as possible.  Ephraim Hamburger, MD 10/02/13 [phone]

## 2013-10-19 ENCOUNTER — Telehealth: Payer: Self-pay

## 2013-10-19 ENCOUNTER — Other Ambulatory Visit: Payer: Self-pay

## 2013-10-19 DIAGNOSIS — N951 Menopausal and female climacteric states: Secondary | ICD-10-CM

## 2013-10-19 MED ORDER — NORETHIN-ETH ESTRAD-FE BIPHAS 1 MG-10 MCG / 10 MCG PO TABS: 1.0000 | ORAL_TABLET | Freq: Every day | ORAL | Status: DC

## 2013-10-19 NOTE — Telephone Encounter (Signed)
Pt. Called stating she has called multiple times and would like her Loestrin refilled and to call [facility] at [phone]. After chart review it appears prescription had been sent to pharmacy on 09/28/12. Called pharmacy, pharmacist stating pt. Was requesting to have prescription for 3 months supply at a time. Ordered prescription so that pt. Can pick up 3 months supply, with one refill as pt. Will need to return for an annual in July. Called pt. With interpreter services as she is deaf, pt. Not available, left message stating prescription was refilled and I spoke with her pharmacist, she should have no problem picking it up. Call clinic with questions.

## 2013-10-28 ENCOUNTER — Ambulatory Visit (INDEPENDENT_AMBULATORY_CARE_PROVIDER_SITE_OTHER): Payer: Medicare Other | Admitting: Obstetrics & Gynecology

## 2013-10-28 DIAGNOSIS — N951 Menopausal and female climacteric states: Secondary | ICD-10-CM

## 2013-10-28 MED ORDER — ESTRADIOL 1 MG PO TABS: 1.0000 mg | ORAL_TABLET | Freq: Every day | ORAL | Status: DC

## 2013-10-28 NOTE — Progress Notes (Signed)
Patient ID: Donna Davis, female   DOB: 07-Oct-1970, 43 y.o.   MRN: [id number] Patient elects not to be seen by provider today but needs refill of Estrace 1 mg daily.  Woodroe Mode, MD

## 2013-10-28 NOTE — Progress Notes (Signed)
Pt. Here today as ED referred her to [place] after right ovary was not visualized on ultrasound. Discussed with pt. That Dr. Roselie Awkward does not believe there is a GYN issue causing her RLQ pain; it is not an issue that her ovary was not visualized and it does not mean it was not there. Recommend following up with a PCP. Pt. Visibly frustrated with care. Offered for Dr. Roselie Awkward to still see pt. Pt. Refused and said there was no reason to. Pt. Also voiced frustration with having difficulty getting prescription for Lo estrin filled last month. Throughly apologized for the mis-communication that had occurred between her pharmacy and our office. Pt. States she can not afford the Loestrin at this time and wishes to continue to take the Estradiol until she gets insurance to cover the Loestrin; pt. Does not like the Estradiol but states it is the only thing that will stop her from having mood swings now. Dr. Roselie Awkward agreed to order the Estradiol. Estradiol prescribed with refills and sent to pt.'s pharmacy. Pt. Left and states she will see her PCP.

## 2013-10-29 ENCOUNTER — Ambulatory Visit (INDEPENDENT_AMBULATORY_CARE_PROVIDER_SITE_OTHER): Payer: BC Managed Care – PPO | Admitting: Internal Medicine

## 2013-10-29 ENCOUNTER — Telehealth: Payer: Self-pay | Admitting: *Deleted

## 2013-10-29 ENCOUNTER — Encounter: Payer: Self-pay | Admitting: Internal Medicine

## 2013-10-29 VITALS — BP 132/81 | HR 98 | Temp 98.7°F | Wt 202.2 lb

## 2013-10-29 DIAGNOSIS — K589 Irritable bowel syndrome without diarrhea: Secondary | ICD-10-CM

## 2013-10-29 DIAGNOSIS — G8929 Other chronic pain: Secondary | ICD-10-CM

## 2013-10-29 DIAGNOSIS — R1031 Right lower quadrant pain: Secondary | ICD-10-CM

## 2013-10-29 MED ORDER — PROMETHAZINE HCL 25 MG RE SUPP: 25.0000 mg | Freq: Four times a day (QID) | RECTAL | Status: DC | PRN

## 2013-10-29 MED ORDER — PROMETHAZINE HCL 12.5 MG PO TABS: 12.5000 mg | ORAL_TABLET | Freq: Four times a day (QID) | ORAL | Status: DC | PRN

## 2013-10-29 NOTE — Assessment & Plan Note (Addendum)
Patient presents w/ a 2 month hx of RLQ pain w/ associated loose stools, BRBPR, nausea. Patient has a known hx of chronic abdominal pain as well as IBS. Since her loose stools began after her cholecystectomy, I believe her diarrhea is a result of this surgery. Patient has had normal colonoscopy just under 2 years ago, so I do not believe the current episode represents CRC as this process is usually more indolent in nature. She may have some hemorrhoids or an AVM causing her bleeding. However, her bleeding has not been significant to cause an anemia on her last CBC 09/29/2013. She also has hx of normal EGD in 2008. Right ovarian cyst should also be considered as it appears that patient still has her R ovary. Patient was recently evaluated by OBGYN and after they performed CT abd/pelvis and a transvaginal US, they did not believe her abd pain was related to GU tract. Patient's current episode is definitely atypical for IBS-type symptoms, but this is also a consideration as patient has hx of this dx. I do not believe patient's RLQ pain represents chronic appendicitis as patient's CT scan did not reveal signs of acute or chronic inflammation. IBD is also very unlikely as patient had normal colonic biopsies in 2013. Patient's CT scan done in January 2015 did reveal a duodenal diverticulum, which may be contributing to her pain, though this is less likely. Overall, I do not have much else to offer this patient. I do not seen any reason to repeat lab work or any imaging today. She is known to the GI clinic and is currently uninsured. Patient does not need another referral from our clinic to be seen by GI, so she will plan to schedule an appointment with GI.

## 2013-10-29 NOTE — Progress Notes (Signed)
Patient ID: Donna Davis, female   DOB: [birth year], 43 y.o.   MRN: [id number] HPI The patient is a 43 y.o. female with a history of migraines, GERD, hypothyroidism, galactorrhea, IBS, anxiety/depression, hysterectomy who presents today for an acute visit.  Patient reports that she has had ongoing RLQ pain x 2 months that has not been worsening, but has been constant over this time period. Nothing seems to improve the pain, but she does note that having bowel movements makes her pain worse. She denies any rectal/anal pain. She has had loose stools 3-4 times daily since her cholecystectomy a few months ago. She also reports noticing small amount of blood in her stool at least once per day. The blood is mixed in with the stool and sometimes there is a small amount in the toilet bowl (blood does not fill the toilet bowl). She notes having nausea intermittently that is made worse by eating and has vomited a total of 5 times since onset of this pain 2 months ago. She has been eating well during this time period. She reports having intermittent chills and took her temperature last week and temp was 102. No fever in the last week. Denies dysuria, back pain, vaginal discharge, hematuria. She has been married for 41yrs and has been monogamous with her husband over this time period. Of note, she was seen by OBGYN 09/29/2013 at which time she had normal CMP, CBC, UA. Transvaginal US at this visit was significant for surgically removed uterus and L ovary with a nonvisualized R ovary (I am unclear if her R ovary was removed during her TAH or not). CT abd/pelvis at this visit was significant for only duodenal diverticulum and moderate stool burden. Patient had colonoscopy in 2013, which was wnl. EGD in 2008 was unrevealing.   ROS: General: +F/C, ?decreased appetite, but eating well  Skin: no rash HEENT: no blurry vision, HA, sore throat Pulm: no dyspnea, coughing, wheezing CV: no chest pain, palpitations, shortness of  breath Abd: see HPI GU: no dysuria, hematuria, polyuria Ext: no arthralgias, myalgias Neuro: no weakness, numbness, or tingling  Filed Vitals:   10/29/13 1435  BP: 132/81  Pulse: 98  Temp: 98.7 F (37.1 C)  SpO2 97% r/a Wt 202lbs (gained 20lbs since 11/3)  Physical Exam General: anxious appearing, alert, cooperative HEENT: pupils equal round and reactive to light, vision grossly intact, oropharynx clear and non-erythematous, MMM Neck: supple Lungs: clear to ascultation bilaterally, normal work of respiration Heart: regular rate and rhythm, no murmurs, gallops, or rubs Abdomen: soft, moderate TTP over RLQ, otherwise non-tender; no rebound tenderness; non-distended, normal bowel sounds Extremities: warm extremities b/l, no pedal edema Neurologic: alert & oriented X3, cranial nerves II-XII grossly intact, strength grossly intact, sensation intact to light touch  Current Outpatient Prescriptions on File Prior to Visit  Medication Sig Dispense Refill  . ascorbic acid (VITAMIN C) 1000 MG tablet Take 1,000 mg by mouth 2 (two) times daily.      Marland Kitchen estradiol (ESTRACE) 1 MG tablet Take 1 tablet (1 mg total) by mouth daily.  30 tablet  6  . levothyroxine (SYNTHROID, LEVOTHROID) 25 MCG tablet Take 1 tablet (25 mcg total) by mouth daily.  31 tablet  11  . Norethindrone-Ethinyl Estradiol-Fe Biphas (LO LOESTRIN FE) 1 MG-10 MCG / 10 MCG tablet Take 1 tablet by mouth daily.  3 Package  1  . Omega-3 Fatty Acids (FISH OIL) 1000 MG CAPS Take 2,000 mg by mouth 2 (two) times daily.      Marland Kitchen  ondansetron (ZOFRAN ODT) 4 MG disintegrating tablet Take 1 tablet (4 mg total) by mouth every 8 (eight) hours as needed for nausea or vomiting.  10 tablet  0  . oxyCODONE-acetaminophen (PERCOCET/ROXICET) 5-325 MG per tablet Take 2 tablets by mouth every 4 (four) hours as needed for severe pain. With food.  10 tablet  0  . promethazine (PHENERGAN) 25 MG suppository Place 1 suppository (25 mg total) rectally every 6 (six)  hours as needed for nausea or vomiting.  12 each  0   No current facility-administered medications on file prior to visit.    Assessment/Plan

## 2013-10-29 NOTE — Progress Notes (Signed)
INTERNAL MEDICINE TEACHING ATTENDING ADDENDUM - Dominic Pea, DO: I reviewed with the resident Dr. Mechele Claude, at the time of visit,  the medical history, physical examination, diagnosis and results of tests and treatment and I agree with the patient's care as documented.

## 2013-10-29 NOTE — Patient Instructions (Signed)
Thank you for your visit.  Please follow up with your GI doctor for further work up for your abdominal pain.  I refilled your phenergan today.  Follow up with your pcp as needed.

## 2013-10-29 NOTE — Telephone Encounter (Signed)
Pt states she cannot afford the price of over 100.00 of lo loestrin 1/ 10 at this time, i called [facility] [address] and they have junel 1/20- generic that they can sell to her at 25.00, are you willing to change this for one month until her insurance picks up again next month, if so please send electronically to [facility] [address].

## 2013-11-02 NOTE — Telephone Encounter (Signed)
I don't prescribe this medication for her, her OB/Gyn does.  Would prefer that provider to continue to prescribe.  Thanks.

## 2013-12-17 ENCOUNTER — Encounter: Payer: Self-pay | Admitting: Internal Medicine

## 2013-12-17 ENCOUNTER — Other Ambulatory Visit: Payer: Self-pay | Admitting: Internal Medicine

## 2013-12-17 DIAGNOSIS — G43909 Migraine, unspecified, not intractable, without status migrainosus: Secondary | ICD-10-CM

## 2013-12-17 MED ORDER — SUMATRIPTAN SUCCINATE 100 MG PO TABS: ORAL_TABLET | ORAL | Status: DC

## 2014-01-06 ENCOUNTER — Encounter (HOSPITAL_COMMUNITY): Payer: Self-pay | Admitting: Emergency Medicine

## 2014-01-06 ENCOUNTER — Emergency Department (HOSPITAL_COMMUNITY)
Admission: EM | Admit: 2014-01-06 | Discharge: 2014-01-06 | Disposition: A | Discharge status: Home / Self Care | Payer: Medicare Other | Attending: Emergency Medicine | Admitting: Emergency Medicine

## 2014-01-06 ENCOUNTER — Telehealth: Payer: Self-pay | Admitting: *Deleted

## 2014-01-06 ENCOUNTER — Emergency Department (HOSPITAL_COMMUNITY): Payer: Medicare Other

## 2014-01-06 DIAGNOSIS — Z8659 Personal history of other mental and behavioral disorders: Secondary | ICD-10-CM | POA: Insufficient documentation

## 2014-01-06 DIAGNOSIS — Y9389 Activity, other specified: Secondary | ICD-10-CM | POA: Insufficient documentation

## 2014-01-06 DIAGNOSIS — H9190 Unspecified hearing loss, unspecified ear: Secondary | ICD-10-CM | POA: Insufficient documentation

## 2014-01-06 DIAGNOSIS — Y929 Unspecified place or not applicable: Secondary | ICD-10-CM | POA: Insufficient documentation

## 2014-01-06 DIAGNOSIS — S4980XA Other specified injuries of shoulder and upper arm, unspecified arm, initial encounter: Secondary | ICD-10-CM | POA: Diagnosis not present

## 2014-01-06 DIAGNOSIS — Z87891 Personal history of nicotine dependence: Secondary | ICD-10-CM | POA: Insufficient documentation

## 2014-01-06 DIAGNOSIS — E039 Hypothyroidism, unspecified: Secondary | ICD-10-CM | POA: Insufficient documentation

## 2014-01-06 DIAGNOSIS — S4992XA Unspecified injury of left shoulder and upper arm, initial encounter: Secondary | ICD-10-CM

## 2014-01-06 DIAGNOSIS — X500XXA Overexertion from strenuous movement or load, initial encounter: Secondary | ICD-10-CM | POA: Insufficient documentation

## 2014-01-06 DIAGNOSIS — S46909A Unspecified injury of unspecified muscle, fascia and tendon at shoulder and upper arm level, unspecified arm, initial encounter: Secondary | ICD-10-CM | POA: Insufficient documentation

## 2014-01-06 DIAGNOSIS — M25519 Pain in unspecified shoulder: Secondary | ICD-10-CM | POA: Diagnosis not present

## 2014-01-06 DIAGNOSIS — Z880 Allergy status to penicillin: Secondary | ICD-10-CM | POA: Insufficient documentation

## 2014-01-06 DIAGNOSIS — Z8719 Personal history of other diseases of the digestive system: Secondary | ICD-10-CM | POA: Insufficient documentation

## 2014-01-06 DIAGNOSIS — Z79899 Other long term (current) drug therapy: Secondary | ICD-10-CM | POA: Insufficient documentation

## 2014-01-06 DIAGNOSIS — M6281 Muscle weakness (generalized): Secondary | ICD-10-CM | POA: Insufficient documentation

## 2014-01-06 DIAGNOSIS — Z8619 Personal history of other infectious and parasitic diseases: Secondary | ICD-10-CM | POA: Insufficient documentation

## 2014-01-06 MED ORDER — OXYCODONE-ACETAMINOPHEN 5-325 MG PO TABS
2.0000 | ORAL_TABLET | Freq: Once | ORAL | Status: AC
  Administered 2014-01-06: 2 via ORAL
  Filled 2014-01-06: qty 2

## 2014-01-06 MED ORDER — OXYCODONE-ACETAMINOPHEN 5-325 MG PO TABS: 1.0000 | ORAL_TABLET | Freq: Four times a day (QID) | ORAL | Status: DC | PRN

## 2014-01-06 NOTE — ED Notes (Signed)
PA at bedside.

## 2014-01-06 NOTE — Discharge Instructions (Signed)
Acromioclavicular Injuries °The AC (acromioclavicular) joint is the joint in the shoulder where the collarbone (clavicle) meets the shoulder blade (scapula). The part of the shoulder blade connected to the collarbone is called the acromion. Common problems with and treatments for the AC joint are detailed below. °ARTHRITIS °Arthritis occurs when the joint has been injured and the smooth padding between the joints (cartilage) is lost. This is the wear and tear seen in most joints of the body if they have been overused. This causes the joint to produce pain and swelling which is worse with activity.  °AC JOINT SEPARATION °AC joint separation means that the ligaments connecting the acromion of the shoulder blade and collarbone have been damaged, and the two bones no longer line up. AC separations can be anywhere from mild to severe, and are "graded" depending upon which ligaments are torn and how badly they are torn. °· Grade I Injury: the least damage is done, and the AC joint still lines up. °· Grade II Injury: damage to the ligaments which reinforce the AC joint. In a Grade II injury, these ligaments are stretched but not entirely torn. When stressed, the AC joint becomes painful and unstable. °· Grade III Injury: AC and secondary ligaments are completely torn, and the collarbone is no longer attached to the shoulder blade. This results in deformity; a prominence of the end of the clavicle. °AC JOINT FRACTURE °AC joint fracture means that there has been a break in the bones of the AC joint, usually the end of the clavicle. °TREATMENT °TREATMENT OF AC ARTHRITIS °· There is currently no way to replace the cartilage damaged by arthritis. The best way to improve the condition is to decrease the activities which aggravate the problem. Application of ice to the joint helps decrease pain and soreness (inflammation). The use of non-steroidal anti-inflammatory medication is helpful. °· If less conservative measures do not  work, then cortisone shots (injections) may be used. These are anti-inflammatories; they decrease the soreness in the joint and swelling. °· If non-surgical measures fail, surgery may be recommended. The procedure is generally removal of a portion of the end of the clavicle. This is the part of the collarbone closest to your acromion which is stabilized with ligaments to the acromion of the shoulder blade. This surgery may be performed using a tube-like instrument with a light (arthroscope) for looking into a joint. It may also be performed as an open surgery through a small incision by the surgeon. Most patients will have good range of motion within 6 weeks and may return to all activity including sports by 8-12 weeks, barring complications. °TREATMENT OF AN AC SEPARATION °· The initial treatment is to decrease pain. This is best accomplished by immobilizing the arm in a sling and placing an ice pack to the shoulder for 20 to 30 minutes every 2 hours as needed. As the pain starts to subside, it is important to begin moving the fingers, wrist, elbow and eventually the shoulder in order to prevent a stiff or "frozen" shoulder. Instruction on when and how much to move the shoulder will be provided by your caregiver. The length of time needed to regain full motion and function depends on the amount or grade of the injury. Recovery from a Grade I AC separation usually takes 10 to 14 days, whereas a Grade III may take 6 to 8 weeks. °· Grade I and II separations usually do not require surgery. Even Grade III injuries usually allow return to full   I and II separations usually do not require surgery. Even Grade III injuries usually allow return to full activity with few restrictions. Treatment is also based on the activity demands of the injured shoulder. For example, a high level quarterback with an injured throwing arm will receive more aggressive treatment than someone with a desk job who rarely uses his/her arm for strenuous activities. In some cases, a painful lump may persist which could require a later surgery. Surgery  can be very successful, but the benefits must be weighed against the potential risks.  TREATMENT OF AN AC JOINT FRACTURE  Fracture treatment depends on the type of fracture. Sometimes a splint or sling may be all that is required. Other times surgery may be required for repair. This is more frequently the case when the ligaments supporting the clavicle are completely torn. Your caregiver will help you with these decisions and together you can decide what will be the best treatment.  HOME CARE INSTRUCTIONS    Apply ice to the injury for 15-20 minutes each hour while awake for 2 days. Put the ice in a plastic bag and place a towel between the bag of ice and skin.   If a sling has been applied, wear it constantly for as long as directed by your caregiver, even at night. The sling or splint can be removed for bathing or showering or as directed. Be sure to keep the shoulder in the same place as when the sling is on. Do not lift the arm.   If a figure-of-eight splint has been applied it should be tightened gently by another person every day. Tighten it enough to keep the shoulders held back. Allow enough room to place the index finger between the body and strap. Loosen the splint immediately if there is numbness or tingling in the hands.   Take over-the-counter or prescription medicines for pain, discomfort or fever as directed by your caregiver.   If you or your child has received a follow up appointment, it is very important to keep that appointment in order to avoid long term complications, chronic pain or disability.  SEEK MEDICAL CARE IF:    The pain is not relieved with medications.   There is increased swelling or discoloration that continues to get worse rather than better.   You or your child has been unable to follow up as instructed.   There is progressive numbness and tingling in the arm, forearm or hand.  SEEK IMMEDIATE MEDICAL CARE IF:    The arm is numb, cold or pale.   There is increasing pain  in the hand, forearm or fingers.  MAKE SURE YOU:    Understand these instructions.   Will watch your condition.   Will get help right away if you are not doing well or get worse.  Document Released: 06/13/2005 Document Revised: 11/26/2011 Document Reviewed: 12/06/2008  [employer] Patient Information 2014 [employer]

## 2014-01-06 NOTE — ED Provider Notes (Signed)
CSN: [id number]     Arrival date & time 01/06/14  1546 History  This chart was scribed for non-physician practitioner Delos Haring, PA-C working with Juanda Crumble B. Karle Starch, MD by Zettie Pho, ED Scribe. This patient was seen in room TR10C/TR10C and the patient's care was started at 5:40 PM.    Chief Complaint  Patient presents with  . Shoulder Pain   The history is provided by the patient. A language interpreter was used.   HPI Comments: Donna Davis is a 43 y.o. female who presents to the Emergency Department complaining of a constant, gradual onset pain to the left shoulder that she states has been ongoing for the past 3 weeks, but patient reports that she went to lift a heavy book-bag yesterday and heard a pop in the area. She states that the pain has been significantly and progressively worsening since then and is exacerbated with movement. Patient reports associated swelling to the shoulder with paresthesias and mild weakness to the left hand and fingers onset yesterday. She reports taking ibuprofen and applying ice to the area at home without significant relief. She reports a history of rotator cuff tear in the right shoulder and states that her current symptoms feel similar. She states that she has an orthopedist in the area. Patient has allergies to amitriptyline, amoxicillin, Cefuroxime, cephalexin, ciprofloxacin, clarithromycin, doxycycline, prednisone, propranolol, sulfonamide derivatives, and Benadryl. Patient has a history of GERD, IBS, hypothyroidism, hepatic cyst, and shingles.   Past Medical History  Diagnosis Date  . GERD (gastroesophageal reflux disease)   . Hypothyroidism   . IBS (irritable bowel syndrome)   . Anxiety   . Hearing difficulty     b/l, 2/2 congenital rubella s/p stapedectomy. Using hearing aid  . Headache(784.0) 04/2005    h/o headache in past that raised question of psuedotumor s/p therapeutic LP with an opening preassure of 18 cm H2O   . Hepatic cyst     6 mm on  CT done done in 05/2005  . Family history of malignant neoplasm of gastrointestinal tract   . Shingles 03/2013   Past Surgical History  Procedure Laterality Date  . Abdominal hysterectomy      partial hysterecotmy 2003 for endoemtriosis with residula right ovaey. Multiple GYN surgeries for chronic pelvic pain  . Cesarean section  1994  . Stapedectomy  2007    Dr. Lucia Gaskins, left ear  . Laparoscopic ovarian cystectomy  2001  . Thyroid nodule removed  1997  . Total abdominal hysterectomy w/ bilateral salpingoophorectomy      left  . Abdominal adhesion surgery      removal from bowel  . Joint replacement    . Cholecystectomy N/A 06/26/2013    Procedure: LAPAROSCOPIC CHOLECYSTECTOMY WITH ATTEMPTED INTRAOPERATIVE CHOLANGIOGRAM;  Surgeon: Harl Bowie, MD;  Location: [facility];  Service: General;  Laterality: N/A;   Family History  Problem Relation Age of Onset  . Breast cancer Mother   . Ovarian cancer Mother   . Diabetes Mother   . Stomach cancer Maternal Grandmother   . Colon cancer Maternal Grandmother   . Diabetes Maternal Grandmother   . Pancreatic cancer Father   . Prostate cancer Father   . Colon cancer Father   . Diabetes Father   . Heart disease Father   . Irritable bowel syndrome Father   . Kidney disease Father   . Heart disease      both grandmothers  . Bipolar disorder Daughter    History  Substance Use Topics  .  Smoking status: Former Smoker    Types: Cigarettes  . Smokeless tobacco: Never Used     Comment: quit 79yrs ago  . Alcohol Use: No   OB History   Grav Para Term Preterm Abortions TAB SAB Ect Mult Living   3 2 2  1 1    2      Review of Systems  Musculoskeletal: Positive for arthralgias and joint swelling.  Neurological: Positive for weakness.       Paresthesias   All other systems reviewed and are negative.     Allergies  Onion; Shellfish allergy; Amitriptyline; Amoxicillin-pot clavulanate; Cefuroxime; Cephalexin; Ciprofloxacin;  Clarithromycin; Doxycycline; Prednisone; Propranolol; Shrimp flavor; Sulfonamide derivatives; and Benadryl  Home Medications   Prior to Admission medications   Medication Sig Start Date End Date Taking? Authorizing Provider  ascorbic acid (VITAMIN C) 1000 MG tablet Take 1,000 mg by mouth 2 (two) times daily.    Historical Provider, MD  estradiol (ESTRACE) 1 MG tablet Take 1 tablet (1 mg total) by mouth daily. 10/28/13   Woodroe Mode, MD  levothyroxine (SYNTHROID, LEVOTHROID) 25 MCG tablet Take 1 tablet (25 mcg total) by mouth daily. 03/09/13   Sid Falcon, MD  Norethindrone-Ethinyl Estradiol-Fe Biphas (LO LOESTRIN FE) 1 MG-10 MCG / 10 MCG tablet Take 1 tablet by mouth daily. 10/19/13   Lavonia Drafts, MD  Omega-3 Fatty Acids (FISH OIL) 1000 MG CAPS Take 2,000 mg by mouth 2 (two) times daily.    Historical Provider, MD  ondansetron (ZOFRAN ODT) 4 MG disintegrating tablet Take 1 tablet (4 mg total) by mouth every 8 (eight) hours as needed for nausea or vomiting. 09/29/13   Lauren Burnetta Sabin, PA-C  oxyCODONE-acetaminophen (PERCOCET/ROXICET) 5-325 MG per tablet Take 2 tablets by mouth every 4 (four) hours as needed for severe pain. With food. 09/29/13   Lauren Burnetta Sabin, PA-C  promethazine (PHENERGAN) 12.5 MG tablet Take 1 tablet (12.5 mg total) by mouth every 6 (six) hours as needed for nausea or vomiting. 10/29/13   Rebecca Eaton, MD  SUMAtriptan (IMITREX) 100 MG tablet TAKE 1/2 TABLET AT THE ONSET OF HEADACHE, IF NOT IMPROVED AFTER 30 MINUTES TAKE THE SECOND HALF 12/17/13   Karren Cobble, MD   Triage Vitals: BP 136/91  Pulse 102  Temp(Src) 99.6 F (37.6 C) (Oral)  Resp 18  SpO2 98%  LMP 12/26/2004  Physical Exam  Nursing note and vitals reviewed. Constitutional: She is oriented to person, place, and time. She appears well-developed and well-nourished. No distress.  HENT:  Head: Normocephalic and atraumatic.  Eyes: Conjunctivae are normal.  Neck: Normal range of motion. Neck  supple.  Pulmonary/Chest: Effort normal. No respiratory distress.  Abdominal: She exhibits no distension.  Musculoskeletal: Normal range of motion. She exhibits tenderness.  Tenderness over the left AC joint. 2+ radial pulse. Normal sensation to all 5 fingers. Grip strength is symmetrical. Decreased range of motion of the shoulder due to pain. No obvious deformities or signs of injury. Skin is warm, moist.   Neurological: She is alert and oriented to person, place, and time.  Skin: Skin is warm and dry.  Psychiatric: She has a normal mood and affect. Her behavior is normal.    ED Course  Procedures (including critical care time)  DIAGNOSTIC STUDIES: Oxygen Saturation is 98% on room air, normal by my interpretation.    COORDINATION OF CARE: 5:30 PM- Ordered Percocet.   5:45 PM- She states that the pain was alleviated after she received the Percocet. Discussed that  x-ray results were negative for fracture or dislocation. Will discharge patient with pain medication, a shoulder sling, and an orthopaedic referral to follow up with tomorrow. Advised patient of further symptomatic care at home. Discussed treatment plan with patient at bedside and patient verbalized agreement.     Labs Review Labs Reviewed - No data to display  Imaging Review Dg Shoulder Left  01/06/2014   CLINICAL DATA:  Shoulder pain  EXAM: LEFT SHOULDER - 2+ VIEW  COMPARISON:  None.  FINDINGS: There is no evidence of fracture or dislocation. There is no evidence of arthropathy or other focal bone abnormality. Soft tissues are unremarkable.  IMPRESSION: No acute abnormality noted.   Electronically Signed   By: Inez Catalina M.D.   On: 01/06/2014 16:44     EKG Interpretation None      MDM   Final diagnoses:  Injury of shoulder, left    43 y.o.Melisia P Sendejo's evaluation in the Emergency Department is complete. It has been determined that no acute conditions requiring further emergency intervention are present at this  time. The patient/guardian have been advised of the diagnosis and plan. We have discussed signs and symptoms that warrant return to the ED, such as changes or worsening in symptoms.  Vital signs are stable at discharge. Filed Vitals:   01/06/14 1800  BP: 125/78  Pulse: 88  Temp: 98 F (36.7 C)  Resp: 16    Patient/guardian has voiced understanding and agreed to follow-up with the PCP or specialist.  I personally performed the services described in this documentation, which was scribed in my presence. The recorded information has been reviewed and is accurate.    Linus Mako, PA-C 01/07/14 1404

## 2014-01-06 NOTE — ED Notes (Signed)
Pt presents to department for evaluation of L shoulder pain. States she felt something "pop" when she lifted book bag yesterday. Now states tingling and numbness to L hand/fingers. 9/10 pain at the time. Pt is alert and oriented x4.

## 2014-01-06 NOTE — Telephone Encounter (Signed)
Pt walked in to clinic and needs to be seen now. Past 3 weeks pain left shoulder. Recently heard a popping sound in that area while trying to position backpack. C/o of  hurting down left arm to two fingers. Pt needs to sign 01/07/14. No opening in clinic today - pt decided to go to ER.Hilda Blades Rjay Revolorio RN 01/06/14 3:45PM

## 2014-01-07 NOTE — ED Provider Notes (Signed)
Medical screening examination/treatment/procedure(s) were performed by non-physician practitioner and as supervising physician I was immediately available for consultation/collaboration.   EKG Interpretation None        Dorinda Stehr B. Papa [place]field, MD 01/07/14 1543 

## 2014-01-15 DIAGNOSIS — M67919 Unspecified disorder of synovium and tendon, unspecified shoulder: Secondary | ICD-10-CM | POA: Diagnosis not present

## 2014-01-15 DIAGNOSIS — M9981 Other biomechanical lesions of cervical region: Secondary | ICD-10-CM | POA: Diagnosis not present

## 2014-01-15 DIAGNOSIS — M999 Biomechanical lesion, unspecified: Secondary | ICD-10-CM | POA: Diagnosis not present

## 2014-01-27 ENCOUNTER — Encounter: Payer: Self-pay | Admitting: Internal Medicine

## 2014-01-28 ENCOUNTER — Other Ambulatory Visit: Payer: Self-pay | Admitting: Oncology

## 2014-01-28 ENCOUNTER — Other Ambulatory Visit: Payer: Self-pay | Admitting: Internal Medicine

## 2014-01-28 MED ORDER — ALPRAZOLAM 0.25 MG PO TABS: 0.2500 mg | ORAL_TABLET | Freq: Every evening | ORAL | Status: DC | PRN

## 2014-01-28 NOTE — Progress Notes (Signed)
Called to pharm 

## 2014-02-04 DIAGNOSIS — M999 Biomechanical lesion, unspecified: Secondary | ICD-10-CM | POA: Diagnosis not present

## 2014-02-04 DIAGNOSIS — M9981 Other biomechanical lesions of cervical region: Secondary | ICD-10-CM | POA: Diagnosis not present

## 2014-02-13 ENCOUNTER — Encounter (HOSPITAL_COMMUNITY): Payer: Self-pay | Admitting: Emergency Medicine

## 2014-02-13 ENCOUNTER — Emergency Department (HOSPITAL_COMMUNITY)
Admission: EM | Admit: 2014-02-13 | Discharge: 2014-02-13 | Disposition: A | Discharge status: Home / Self Care | Payer: Medicare Other | Attending: Emergency Medicine | Admitting: Emergency Medicine

## 2014-02-13 DIAGNOSIS — Z9889 Other specified postprocedural states: Secondary | ICD-10-CM | POA: Insufficient documentation

## 2014-02-13 DIAGNOSIS — K219 Gastro-esophageal reflux disease without esophagitis: Secondary | ICD-10-CM | POA: Insufficient documentation

## 2014-02-13 DIAGNOSIS — R197 Diarrhea, unspecified: Secondary | ICD-10-CM | POA: Diagnosis not present

## 2014-02-13 DIAGNOSIS — R109 Unspecified abdominal pain: Secondary | ICD-10-CM | POA: Diagnosis not present

## 2014-02-13 DIAGNOSIS — Z87891 Personal history of nicotine dependence: Secondary | ICD-10-CM | POA: Insufficient documentation

## 2014-02-13 DIAGNOSIS — Z90710 Acquired absence of both cervix and uterus: Secondary | ICD-10-CM | POA: Insufficient documentation

## 2014-02-13 DIAGNOSIS — Z79899 Other long term (current) drug therapy: Secondary | ICD-10-CM | POA: Insufficient documentation

## 2014-02-13 DIAGNOSIS — Z9089 Acquired absence of other organs: Secondary | ICD-10-CM | POA: Insufficient documentation

## 2014-02-13 DIAGNOSIS — E039 Hypothyroidism, unspecified: Secondary | ICD-10-CM | POA: Insufficient documentation

## 2014-02-13 DIAGNOSIS — K589 Irritable bowel syndrome without diarrhea: Secondary | ICD-10-CM | POA: Diagnosis not present

## 2014-02-13 DIAGNOSIS — H9190 Unspecified hearing loss, unspecified ear: Secondary | ICD-10-CM | POA: Insufficient documentation

## 2014-02-13 DIAGNOSIS — F411 Generalized anxiety disorder: Secondary | ICD-10-CM | POA: Insufficient documentation

## 2014-02-13 DIAGNOSIS — Z8619 Personal history of other infectious and parasitic diseases: Secondary | ICD-10-CM | POA: Insufficient documentation

## 2014-02-13 DIAGNOSIS — Z789 Other specified health status: Secondary | ICD-10-CM | POA: Insufficient documentation

## 2014-02-13 DIAGNOSIS — Z880 Allergy status to penicillin: Secondary | ICD-10-CM | POA: Insufficient documentation

## 2014-02-13 LAB — COMPREHENSIVE METABOLIC PANEL
ALBUMIN: 3.8 g/dL (ref 3.5–5.2)
ALT: 20 U/L (ref 0–35)
AST: 39 U/L — ABNORMAL HIGH (ref 0–37)
Alkaline Phosphatase: 60 U/L (ref 39–117)
BUN: 9 mg/dL (ref 6–23)
CO2: 23 mEq/L (ref 19–32)
Calcium: 9.4 mg/dL (ref 8.4–10.5)
Chloride: 101 mEq/L (ref 96–112)
Creatinine, Ser: 0.61 mg/dL (ref 0.50–1.10)
GFR calc Af Amer: >90 mL/min (ref >90)
GFR calc non Af Amer: >90 mL/min (ref >90)
Glucose, Bld: 76 mg/dL (ref 70–99)
Potassium: 4.6 mEq/L (ref 3.7–5.3)
Sodium: 140 mEq/L (ref 137–147)
Total Protein: 7.8 g/dL (ref 6.0–8.3)

## 2014-02-13 LAB — CBC
HEMATOCRIT: 43.2 % (ref 36.0–46.0)
Hemoglobin: 14.7 g/dL (ref 12.0–15.0)
MCH: 31.6 pg (ref 26.0–34.0)
MCHC: 34 g/dL (ref 30.0–36.0)
MCV: 92.9 fL (ref 78.0–100.0)
Platelets: 329 10*3/uL (ref 150–400)
RBC: 4.65 MIL/uL (ref 3.87–5.11)
RDW: 12.4 % (ref 11.5–15.5)
WBC: 9.8 10*3/uL (ref 4.0–10.5)

## 2014-02-13 LAB — LIPASE, BLOOD: LIPASE: 50 U/L (ref 11–59)

## 2014-02-13 MED ORDER — SODIUM CHLORIDE 0.9 % IV BOLUS (SEPSIS)
1000.0000 mL | Freq: Once | INTRAVENOUS | Status: AC
  Administered 2014-02-13: 1000 mL via INTRAVENOUS

## 2014-02-13 MED ORDER — HYDROMORPHONE HCL PF 1 MG/ML IJ SOLN
1.0000 mg | Freq: Once | INTRAMUSCULAR | Status: AC
  Administered 2014-02-13: 1 mg via INTRAVENOUS
  Filled 2014-02-13: qty 1

## 2014-02-13 MED ORDER — ONDANSETRON HCL 4 MG/2ML IJ SOLN
4.0000 mg | Freq: Once | INTRAMUSCULAR | Status: AC
  Administered 2014-02-13: 4 mg via INTRAVENOUS
  Filled 2014-02-13: qty 2

## 2014-02-13 NOTE — ED Provider Notes (Signed)
CSN: [id number]     Arrival date & time 02/13/14  1900 History   First MD Initiated Contact with Patient 02/13/14 1910     Chief Complaint  Patient presents with  . Abdominal Pain     (Consider location/radiation/quality/duration/timing/severity/associated sxs/prior Treatment) Patient is a 43 y.o. female presenting with abdominal pain. The history is provided by the patient. A language interpreter was used (sign language interpreter used).  Abdominal Pain Associated symptoms: diarrhea and nausea   Associated symptoms: no chest pain, no chills, no cough, no dysuria, no fever, no shortness of breath, no sore throat, no vaginal bleeding, no vaginal discharge and no vomiting   pt w hx cholecystectomy, hx IBS, c/o right mid to upper abd pain and diarrhea in the past few days. Diarrhea w few episodes loose to watery bm each day for the past few days, not bloody. Nausea. No vomiting. Pain moderate, crampy, constant. No specific exacerbating or alleviating factors. Hx IBS, but says these symptoms are more prolonged than typical. Denies dysuria or gu c/o. Making normal amt urine. No pelvic pain or vaginal discharge or bleeding. Hx hysterectomy. No fever or chills. No chest pain. No cough or uri c/o. No sob.      Past Medical History  Diagnosis Date  . GERD (gastroesophageal reflux disease)   . Hypothyroidism   . IBS (irritable bowel syndrome)   . Anxiety   . Hearing difficulty     b/l, 2/2 congenital rubella s/p stapedectomy. Using hearing aid  . Headache(784.0) 04/2005    h/o headache in past that raised question of psuedotumor s/p therapeutic LP with an opening preassure of 18 cm H2O   . Hepatic cyst     6 mm on CT done done in 05/2005  . Family history of malignant neoplasm of gastrointestinal tract   . Shingles 03/2013   Past Surgical History  Procedure Laterality Date  . Abdominal hysterectomy      partial hysterecotmy 2003 for endoemtriosis with residula right ovaey. Multiple GYN  surgeries for chronic pelvic pain  . Cesarean section  1994  . Stapedectomy  2007    Dr. Lucia Gaskins, left ear  . Laparoscopic ovarian cystectomy  2001  . Thyroid nodule removed  1997  . Total abdominal hysterectomy w/ bilateral salpingoophorectomy      left  . Abdominal adhesion surgery      removal from bowel  . Joint replacement    . Cholecystectomy N/A 06/26/2013    Procedure: LAPAROSCOPIC CHOLECYSTECTOMY WITH ATTEMPTED INTRAOPERATIVE CHOLANGIOGRAM;  Surgeon: Harl Bowie, MD;  Location: [facility];  Service: General;  Laterality: N/A;   Family History  Problem Relation Age of Onset  . Breast cancer Mother   . Ovarian cancer Mother   . Diabetes Mother   . Stomach cancer Maternal Grandmother   . Colon cancer Maternal Grandmother   . Diabetes Maternal Grandmother   . Pancreatic cancer Father   . Prostate cancer Father   . Colon cancer Father   . Diabetes Father   . Heart disease Father   . Irritable bowel syndrome Father   . Kidney disease Father   . Heart disease      both grandmothers  . Bipolar disorder Daughter    History  Substance Use Topics  . Smoking status: Former Smoker    Types: Cigarettes  . Smokeless tobacco: Never Used     Comment: quit 24yrs ago  . Alcohol Use: No   OB History   Grav Para Term Preterm  Abortions TAB SAB Ect Mult Living   3 2 2  1 1    2      Review of Systems  Constitutional: Negative for fever and chills.  HENT: Negative for sore throat.   Eyes: Negative for redness.  Respiratory: Negative for cough and shortness of breath.   Cardiovascular: Negative for chest pain, palpitations and leg swelling.  Gastrointestinal: Positive for nausea, abdominal pain and diarrhea. Negative for vomiting.  Genitourinary: Negative for dysuria, flank pain, vaginal bleeding and vaginal discharge.  Musculoskeletal: Negative for back pain and neck pain.  Skin: Negative for rash.  Neurological: Negative for headaches.  Hematological: Does not bruise/bleed  easily.  Psychiatric/Behavioral: Negative for confusion.      Allergies  Onion; Shellfish allergy; Amitriptyline; Amoxicillin-pot clavulanate; Cefuroxime; Cephalexin; Ciprofloxacin; Clarithromycin; Doxycycline; Prednisone; Propranolol; Shrimp flavor; Sulfonamide derivatives; and Benadryl  Home Medications   Prior to Admission medications   Medication Sig Start Date End Date Taking? Authorizing Provider  ALPRAZolam (XANAX) 0.25 MG tablet Take 1 tablet (0.25 mg total) by mouth at bedtime as needed for anxiety. 01/28/14  Yes Bartholomew Crews, MD  ascorbic acid (VITAMIN C) 1000 MG tablet Take 1,000 mg by mouth 2 (two) times daily.   Yes Historical Provider, MD  Calcium Carbonate Antacid (ANTACID PO) Take 1 tablet by mouth daily as needed (heartburn).   Yes Historical Provider, MD  levothyroxine (SYNTHROID, LEVOTHROID) 25 MCG tablet Take 1 tablet (25 mcg total) by mouth daily. 03/09/13  Yes Sid Falcon, MD  Norethindrone-Ethinyl Estradiol-Fe Biphas (LO LOESTRIN FE) 1 MG-10 MCG / 10 MCG tablet Take 1 tablet by mouth daily. 10/19/13  Yes Lavonia Drafts, MD  Omega-3 Fatty Acids (FISH OIL) 1000 MG CAPS Take 2,000 mg by mouth 2 (two) times daily.   Yes Historical Provider, MD  SUMAtriptan (IMITREX) 100 MG tablet Take 100 mg by mouth every 2 (two) hours as needed for migraine or headache. May repeat in 2 hours if headache persists or recurs.   Yes Historical Provider, MD   BP 129/68  Pulse 87  Temp(Src) 99.1 F (37.3 C) (Oral)  Resp 18  SpO2 100%  LMP 12/26/2004 Physical Exam  Nursing note and vitals reviewed. Constitutional: She appears well-developed and well-nourished. No distress.  HENT:  Nose: Nose normal.  Mouth/Throat: Oropharynx is clear and moist.  Eyes: Conjunctivae are normal. No scleral icterus.  Neck: Neck supple. No tracheal deviation present.  Cardiovascular: Normal rate, regular rhythm, normal heart sounds and intact distal pulses.   Pulmonary/Chest: Effort normal  and breath sounds normal. No respiratory distress.  Abdominal: Soft. Normal appearance and bowel sounds are normal. She exhibits no distension and no mass. There is no tenderness. There is no rebound and no guarding.  Genitourinary:  No cva tenderness  Musculoskeletal: She exhibits no edema.  Neurological: She is alert.  Skin: Skin is warm and dry. No rash noted. She is not diaphoretic.  Psychiatric: She has a normal mood and affect.    ED Course  Procedures (including critical care time) Labs Review Results for orders placed during the hospital encounter of 02/13/14  CBC      Result Value Ref Range   WBC 9.8  4.0 - 10.5 K/uL   RBC 4.65  3.87 - 5.11 MIL/uL   Hemoglobin 14.7  12.0 - 15.0 g/dL   HCT 43.2  36.0 - 46.0 %   MCV 92.9  78.0 - 100.0 fL   MCH 31.6  26.0 - 34.0 pg   MCHC 34.0  30.0 - 36.0 g/dL   RDW 12.4  11.5 - 15.5 %   Platelets 329  150 - 400 K/uL  COMPREHENSIVE METABOLIC PANEL      Result Value Ref Range   Sodium 140  137 - 147 mEq/L   Potassium 4.6  3.7 - 5.3 mEq/L   Chloride 101  96 - 112 mEq/L   CO2 23  19 - 32 mEq/L   Glucose, Bld 76  70 - 99 mg/dL   BUN 9  6 - 23 mg/dL   Creatinine, Ser 0.61  0.50 - 1.10 mg/dL   Calcium 9.4  8.4 - 10.5 mg/dL   Total Protein 7.8  6.0 - 8.3 g/dL   Albumin 3.8  3.5 - 5.2 g/dL   AST 39 (*) 0 - 37 U/L   ALT 20  0 - 35 U/L   Alkaline Phosphatase 60  39 - 117 U/L   Total Bilirubin <0.2 (*) 0.3 - 1.2 mg/dL   GFR calc non Af Amer >90  >90 mL/min   GFR calc Af Amer >90  >90 mL/min  LIPASE, BLOOD      Result Value Ref Range   Lipase 50  11 - 59 U/L       MDM  Iv ns bolus. Dilaudid 1 mg iv. zofran iv.   Labs.  Reviewed nursing notes and prior charts for additional history.   Recheck pt feels much improved.  No episodes nvd during ED stay.  abd soft nt.  Pt tolerating po fluids.  Pt appears stable for d/c.       Mirna Mires, MD 02/13/14 2314

## 2014-02-13 NOTE — ED Notes (Signed)
The pt is hearing impaired.  She reads lips  Slowly.  intrepreter has been called

## 2014-02-13 NOTE — Discharge Instructions (Signed)
Rest. Drink plenty of fluids. Follow up with primary care doctor this Monday if symptoms fail to improve/resolve. Return to ER if worse, new symptoms, worsening or severe abdominal pain, persistent vomiting, fevers, other concern.  You were given pain medication in the ER - no driving for the next 6 hours.    Abdominal Pain, Adult Many things can cause abdominal pain. Usually, abdominal pain is not caused by a disease and will improve without treatment. It can often be observed and treated at home. Your health care provider will do a physical exam and possibly order blood tests and X-rays to help determine the seriousness of your pain. However, in many cases, more time must pass before a clear cause of the pain can be found. Before that point, your health care provider may not know if you need more testing or further treatment. HOME CARE INSTRUCTIONS  Monitor your abdominal pain for any changes. The following actions may help to alleviate any discomfort you are experiencing:  Only take over-the-counter or prescription medicines as directed by your health care provider.  Do not take laxatives unless directed to do so by your health care provider.  Try a clear liquid diet (broth, tea, or water) as directed by your health care provider. Slowly move to a bland diet as tolerated. SEEK MEDICAL CARE IF:  You have unexplained abdominal pain.  You have abdominal pain associated with nausea or diarrhea.  You have pain when you urinate or have a bowel movement.  You experience abdominal pain that wakes you in the night.  You have abdominal pain that is worsened or improved by eating food.  You have abdominal pain that is worsened with eating fatty foods. SEEK IMMEDIATE MEDICAL CARE IF:   Your pain does not go away within 2 hours.  You have a fever.  You keep throwing up (vomiting).  Your pain is felt only in portions of the abdomen, such as the right side or the left lower portion of the  abdomen.  You pass bloody or black tarry stools. MAKE SURE YOU:  Understand these instructions.   Will watch your condition.   Will get help right away if you are not doing well or get worse.  Document Released: 06/13/2005 Document Revised: 06/24/2013 Document Reviewed: 05/13/2013 [facility] Patient Information 2014 [place].    Diarrhea Diarrhea is frequent loose and watery bowel movements. It can cause you to feel weak and dehydrated. Dehydration can cause you to become tired and thirsty, have a dry mouth, and have decreased urination that often is dark yellow. Diarrhea is a sign of another problem, most often an infection that will not last long. In most cases, diarrhea typically lasts 2 3 days. However, it can last longer if it is a sign of something more serious. It is important to treat your diarrhea as directed by your caregive to lessen or prevent future episodes of diarrhea. CAUSES  Some common causes include:  Gastrointestinal infections caused by viruses, bacteria, or parasites.  Food poisoning or food allergies.  Certain medicines, such as antibiotics, chemotherapy, and laxatives.  Artificial sweeteners and fructose.  Digestive disorders. HOME CARE INSTRUCTIONS  Ensure adequate fluid intake (hydration): have 1 cup (8 oz) of fluid for each diarrhea episode. Avoid fluids that contain simple sugars or sports drinks, fruit juices, whole milk products, and sodas. Your urine should be clear or pale yellow if you are drinking enough fluids. Hydrate with an oral rehydration solution that you can purchase at pharmacies, retail  stores, and online. You can prepare an oral rehydration solution at home by mixing the following ingredients together:    tsp table salt.   tsp baking soda.   tsp salt substitute containing potassium chloride.  1  tablespoons sugar.  1 L (34 oz) of water.  Certain foods and beverages may increase the speed at which food moves through the  gastrointestinal (GI) tract. These foods and beverages should be avoided and include:  Caffeinated and alcoholic beverages.  High-fiber foods, such as raw fruits and vegetables, nuts, seeds, and whole grain breads and cereals.  Foods and beverages sweetened with sugar alcohols, such as xylitol, sorbitol, and mannitol.  Some foods may be well tolerated and may help thicken stool including:  Starchy foods, such as rice, toast, pasta, low-sugar cereal, oatmeal, grits, baked potatoes, crackers, and bagels.  Bananas.  Applesauce.  Add probiotic-rich foods to help increase healthy bacteria in the GI tract, such as yogurt and fermented milk products.  Wash your hands well after each diarrhea episode.  Only take over-the-counter or prescription medicines as directed by your caregiver.  Take a warm bath to relieve any burning or pain from frequent diarrhea episodes. SEEK IMMEDIATE MEDICAL CARE IF:   You are unable to keep fluids down.  You have persistent vomiting.  You have blood in your stool, or your stools are black and tarry.  You do not urinate in 6 8 hours, or there is only a small amount of very dark urine.  You have abdominal pain that increases or localizes.  You have weakness, dizziness, confusion, or lightheadedness.  You have a severe headache.  Your diarrhea gets worse or does not get better.  You have a fever or persistent symptoms for more than 2 3 days.  You have a fever and your symptoms suddenly get worse. MAKE SURE YOU:   Understand these instructions.  Will watch your condition.  Will get help right away if you are not doing well or get worse. Document Released: 08/24/2002 Document Revised: 08/20/2012 Document Reviewed: 05/11/2012 [facility] Patient Information 2014 [place], [place].    Irritable Bowel Syndrome Irritable Bowel Syndrome (IBS) is caused by a disturbance of normal bowel function. Other terms used are spastic colon, mucous colitis, and  irritable colon. It does not require surgery, nor does it lead to cancer. There is no cure for IBS. But with proper diet, stress reduction, and medication, you will find that your problems (symptoms) will gradually disappear or improve. IBS is a common digestive disorder. It usually appears in late adolescence or early adulthood. Women develop it twice as often as men. CAUSES  After food has been digested and absorbed in the small intestine, waste material is moved into the colon (large intestine). In the colon, water and salts are absorbed from the undigested products coming from the small intestine. The remaining residue, or fecal material, is held for elimination. Under normal circumstances, gentle, rhythmic contractions on the bowel walls push the fecal material along the colon towards the rectum. In IBS, however, these contractions are irregular and poorly coordinated. The fecal material is either retained too long, resulting in constipation, or expelled too soon, producing diarrhea. SYMPTOMS  The most common symptom of IBS is pain. It is typically in the lower left side of the belly (abdomen). But it may occur anywhere in the abdomen. It can be felt as heartburn, backache, or even as a dull pain in the arms or shoulders. The pain comes from excessive bowel-muscle spasms and  from the buildup of gas and fecal material in the colon. This pain:  Can range from sharp belly (abdominal) cramps to a dull, continuous ache.  Usually worsens soon after eating.  Is typically relieved by having a bowel movement or passing gas. Abdominal pain is usually accompanied by constipation. But it may also produce diarrhea. The diarrhea typically occurs right after a meal or upon arising in the morning. The stools are typically soft and watery. They are often flecked with secretions (mucus). Other symptoms of IBS include:  Bloating.  Loss of appetite.  Heartburn.  Feeling sick to your stomach  (nausea).  Belching  Vomiting  Gas. IBS may also cause a number of symptoms that are unrelated to the digestive system:  Fatigue.  Headaches.  Anxiety  Shortness of breath  Difficulty in concentrating.  Dizziness. These symptoms tend to come and go. DIAGNOSIS  The symptoms of IBS closely mimic the symptoms of other, more serious digestive disorders. So your caregiver may wish to perform a variety of additional tests to exclude these disorders. He/she wants to be certain of learning what is wrong (diagnosis). The nature and purpose of each test will be explained to you. TREATMENT A number of medications are available to help correct bowel function and/or relieve bowel spasms and abdominal pain. Among the drugs available are:  Mild, non-irritating laxatives for severe constipation and to help restore normal bowel habits.  Specific anti-diarrheal medications to treat severe or prolonged diarrhea.  Anti-spasmodic agents to relieve intestinal cramps.  Your caregiver may also decide to treat you with a mild tranquilizer or sedative during unusually stressful periods in your life. The important thing to remember is that if any drug is prescribed for you, make sure that you take it exactly as directed. Make sure that your caregiver knows how well it worked for you. HOME CARE INSTRUCTIONS   Avoid foods that are high in fat or oils. Some examples EML:JQGBE cream, butter, frankfurters, sausage, and other fatty meats.  Avoid foods that have a laxative effect, such as fruit, fruit juice, and dairy products.  Cut out carbonated drinks, chewing gum, and "gassy" foods, such as beans and cabbage. This may help relieve bloating and belching.  Bran taken with plenty of liquids may help relieve constipation.  Keep track of what foods seem to trigger your symptoms.  Avoid emotionally charged situations or circumstances that produce anxiety.  Start or continue exercising.  Get plenty of  rest and sleep. MAKE SURE YOU:   Understand these instructions.  Will watch your condition.  Will get help right away if you are not doing well or get worse. Document Released: 09/03/2005 Document Revised: 11/26/2011 Document Reviewed: 04/23/2008 [facility] Patient Information 2014 [place].

## 2014-02-13 NOTE — ED Notes (Signed)
Dr. Steinl at bedside 

## 2014-02-13 NOTE — ED Notes (Signed)
The pt is c/o abd pain intermittently since Tuesday with nausea and vomiting.  Rt upper abd pain her gb has been removed   lmp none

## 2014-02-13 NOTE — ED Notes (Signed)
Pt states that she has had her gallbladder removed, and that she has hx of IBS, but this does not feel like a flare up.

## 2014-02-13 NOTE — ED Notes (Signed)
MD at bedside. 

## 2014-02-13 NOTE — ED Notes (Signed)
Pt brought back to room with family in tow; pt getting undressed and into a gown at this time; Vicente Males, RN aware

## 2014-02-17 ENCOUNTER — Ambulatory Visit (INDEPENDENT_AMBULATORY_CARE_PROVIDER_SITE_OTHER): Payer: Medicare Other | Admitting: Internal Medicine

## 2014-02-17 ENCOUNTER — Encounter: Payer: Self-pay | Admitting: Internal Medicine

## 2014-02-17 VITALS — BP 130/78 | HR 86 | Temp 98.4°F | Resp 20 | Ht 61.0 in | Wt 209.6 lb

## 2014-02-17 DIAGNOSIS — R52 Pain, unspecified: Secondary | ICD-10-CM | POA: Diagnosis not present

## 2014-02-17 DIAGNOSIS — R1084 Generalized abdominal pain: Secondary | ICD-10-CM

## 2014-02-17 DIAGNOSIS — G43909 Migraine, unspecified, not intractable, without status migrainosus: Secondary | ICD-10-CM

## 2014-02-17 DIAGNOSIS — F411 Generalized anxiety disorder: Secondary | ICD-10-CM

## 2014-02-17 DIAGNOSIS — E039 Hypothyroidism, unspecified: Secondary | ICD-10-CM

## 2014-02-17 DIAGNOSIS — R197 Diarrhea, unspecified: Secondary | ICD-10-CM | POA: Diagnosis not present

## 2014-02-17 DIAGNOSIS — Z0000 Encounter for general adult medical examination without abnormal findings: Secondary | ICD-10-CM | POA: Diagnosis not present

## 2014-02-17 DIAGNOSIS — M9981 Other biomechanical lesions of cervical region: Secondary | ICD-10-CM | POA: Diagnosis not present

## 2014-02-17 DIAGNOSIS — E669 Obesity, unspecified: Secondary | ICD-10-CM | POA: Diagnosis not present

## 2014-02-17 DIAGNOSIS — M999 Biomechanical lesion, unspecified: Secondary | ICD-10-CM | POA: Diagnosis not present

## 2014-02-17 LAB — COMPLETE METABOLIC PANEL WITH GFR
ALT: 17 U/L (ref 0–35)
AST: 19 U/L (ref 0–37)
Albumin: 4.1 g/dL (ref 3.5–5.2)
Alkaline Phosphatase: 46 U/L (ref 39–117)
BILIRUBIN TOTAL: 0.5 mg/dL (ref 0.2–1.2)
BUN: 7 mg/dL (ref 6–23)
CO2: 24 meq/L (ref 19–32)
CREATININE: 0.68 mg/dL (ref 0.50–1.10)
Calcium: 9 mg/dL (ref 8.4–10.5)
Chloride: 105 mEq/L (ref 96–112)
GFR, Est Non African American: >89 mL/min
Glucose, Bld: 75 mg/dL (ref 70–99)
Potassium: 3.9 mEq/L (ref 3.5–5.3)
Sodium: 139 mEq/L (ref 135–145)
Total Protein: 6.7 g/dL (ref 6.0–8.3)

## 2014-02-17 LAB — TSH: TSH: 4.006 u[IU]/mL (ref 0.350–4.500)

## 2014-02-17 LAB — LIPASE: LIPASE: 29 U/L (ref 0–75)

## 2014-02-17 NOTE — Progress Notes (Signed)
Subjective:    Patient ID: Donna Davis, female    DOB: June 27, 1971, 43 y.o.   MRN: [id number]  HPI Donna Davis is a 43yo woman with PMH of Migraine, anxiety, hypothyroidism.  Since last being seen she had an injury to her shoulder for which she was seen in the ED.  She further had an episode of N/V/D for which she was seen in UC, given fluids and pain medication and symptoms improved.   Donna Davis continues to have symptoms of diarrhea, nausea and abdominal pain.  The diarrhea started last week on Tuesday and became "explosive" on Friday.  The diarrhea came on with any oral intake and also when she was not eating.  The episodes have slowed down now, but at its worst she had about 16 BM per day.  Associated symptoms include urgency, nausea.  The stool is brown to yellow in color.  She previously could not even drink water but this has improved.  Further associated symptoms include cramping abdominal pain and early on some fatigue.  The abdominal pain is in 2 parts, she has sharp pain in her RUQ and epigastrium and cramping pain all over when she needs to have a BM.  The nausea is present in the middle of her belly and improved with zofran.  She has not noticed any blood in the stool and she has no rectal pain.  She has a history of cholecystectomy.  She does not work with children, has not travelled.  She does have well water, but no one else in the family is sick.  She has not had fever, chills or sweats.    Her anxiety has worsened since last being seen and she has restarted taking very low dose xanax.  Stressors include marital issues for which she is in counseling; her daughter is in the [employer] and may soon be deployed and her mother has recently been diagnosed with cancer which may be metastatic.    3 migraines since she was last seen.  Due to stress.  Imitrex seems to help somewhat.    She is interested in losing weight, she has noticed a decrease in activity recently and inability to work out for  more than 5 minutes without being short of breath.   Sometimes her SOB comes on at rest and these seem to be associated with stress/anxiety.  She does have some LE swelling, however, this has been somewhat chronic.  The SOB and decreased exercise tolerance started between 3-4 months ago.   She is not smoking  Current Outpatient Prescriptions on File Prior to Visit  Medication Sig Dispense Refill  . ALPRAZolam (XANAX) 0.25 MG tablet Take 1 tablet (0.25 mg total) by mouth at bedtime as needed for anxiety.  30 tablet  0  . ascorbic acid (VITAMIN C) 1000 MG tablet Take 1,000 mg by mouth 2 (two) times daily.      . Calcium Carbonate Antacid (ANTACID PO) Take 1 tablet by mouth daily as needed (heartburn).      Marland Kitchen levothyroxine (SYNTHROID, LEVOTHROID) 25 MCG tablet Take 1 tablet (25 mcg total) by mouth daily.  31 tablet  11  . Norethindrone-Ethinyl Estradiol-Fe Biphas (LO LOESTRIN FE) 1 MG-10 MCG / 10 MCG tablet Take 1 tablet by mouth daily.  3 Package  1  . Omega-3 Fatty Acids (FISH OIL) 1000 MG CAPS Take 2,000 mg by mouth 2 (two) times daily.      . SUMAtriptan (IMITREX) 100 MG tablet Take 100 mg  by mouth every 2 (two) hours as needed for migraine or headache. May repeat in 2 hours if headache persists or recurs.       No current facility-administered medications on file prior to visit.    Review of Systems  Constitutional: Positive for activity change. Negative for fever and diaphoresis.  Eyes: Negative for pain and redness.  Respiratory: Positive for shortness of breath. Negative for cough and chest tightness.   Gastrointestinal: Positive for nausea, vomiting, abdominal pain and diarrhea. Negative for constipation and blood in stool.  Genitourinary: Negative for dysuria and difficulty urinating.  Musculoskeletal: Negative for arthralgias and back pain.  Skin: Negative for rash and wound.  Neurological: Positive for headaches. Negative for dizziness, seizures and light-headedness.        Deafness, interpreter in room       Objective:   Physical Exam  Constitutional: She is oriented to person, place, and time. She appears well-developed and well-nourished. No distress.  HENT:  Head: Normocephalic and atraumatic.  Eyes: Right eye exhibits no discharge. Left eye exhibits no discharge. No scleral icterus.  Cardiovascular: Normal rate and regular rhythm.   No murmur heard. Pulmonary/Chest: Effort normal and breath sounds normal. No respiratory distress. She has no wheezes.  Abdominal: Soft. Bowel sounds are normal. She exhibits no distension. There is tenderness (RUQ sharp pain with palpation and some dulll pain in the epigastrium). There is no rebound and no guarding.  Musculoskeletal: She exhibits no edema.  Neurological: She is alert and oriented to person, place, and time.  Skin: Skin is warm and dry. She is not diaphoretic. No erythema.  Psychiatric: She has a normal mood and affect. Her behavior is normal.    Labs from ED reviewed.   Send stool sample today      Assessment & Plan:  Please see problem oriented charting.

## 2014-02-17 NOTE — Patient Instructions (Signed)
Please schedule a follow up visit within the next 2 months, I will email you with the results of the lab tests we have ordered today.   For your medications:   Please bring all of your pill  Bottles with you to each visit.  This will help make sure that we have an up to date list of all the medications you are taking.  Please also bring any over the counter herbal medications you are taking (not including advil, tylenol, etc.)  Please continue taking all of your medications as prescribed.  Please call me if your xanax starts to cause side effects as before or is not effective.    For your diarrhea: Please bring in a stool sample when you are able for [place] to check for any infection that may be causing your symptoms.    For your interest in weight loss, we will start with having you see our dietician and getting on a weight loss plan.    Thank you!

## 2014-02-18 DIAGNOSIS — E669 Obesity, unspecified: Secondary | ICD-10-CM | POA: Insufficient documentation

## 2014-02-18 HISTORY — DX: Obesity, unspecified: E66.9

## 2014-02-18 LAB — FECAL LACTOFERRIN, QUANT: Lactoferrin: NEGATIVE

## 2014-02-18 NOTE — Assessment & Plan Note (Signed)
>>  ASSESSMENT AND PLAN FOR OBESITY (BMI 30-39.9) WRITTEN ON 02/18/2014  9:43 AM BY MULLEN, EMILY B, MD  She desires weight loss, would like to try diet/exercise first.  Will send her to dietician.

## 2014-02-18 NOTE — Assessment & Plan Note (Signed)
MMG - Normal 2014 - she is due and I reminded her to schedule.  Pelvic - hysterectomy, no cervix.  Sees OBGYN Flu vaccine seasonally.   DEXA - at 43yo

## 2014-02-18 NOTE — Assessment & Plan Note (Signed)
Stable on imitrex.  We have tried multiple options for long term control and she has not been able to tolerate beta blockers, amitriptyline or topomax.

## 2014-02-18 NOTE — Assessment & Plan Note (Signed)
She has had diarrhea now for about 1 week.  The symptoms seem to be improving.  She does not have any systemic symptoms that are concerning (not dehydrated, no hypotension).  Her labs in the ED were okay with a very mildly elevated AST.  Her repeat labs have returned to normal, renal function is normal.  Stool lactoferrin is neg and a culture is pending, however, I do not have a strong sense that this is infectious.  Possibly related to severe anxiety, however, she could also have had an acute episodes of HAV (given the mild bump in LFTs) or food poisoning which is now improving.  This could also be a worsening of her previously diagnosed IBS.  If cultures are negative will continue to monitor for now.  If she continues to have symptoms > 2-4 weeks, will check for chronic causes of diarrhea.  If her abdominal pain persists, will consider [place] of abdomen for possible retained gall duct stone (she is s/p cholecystectomy).

## 2014-02-18 NOTE — Assessment & Plan Note (Signed)
Recently worsened due to life stressors.  She is in counseling.  She has good results with 0.25 of xanax for sleep and occasionally for stress during the day.

## 2014-02-18 NOTE — Assessment & Plan Note (Signed)
She desires weight loss, would like to try diet/exercise first.  Will send her to dietician.

## 2014-02-18 NOTE — Assessment & Plan Note (Signed)
She is on levothyroxine and doing well on current dose of 91mcg/day.  TSH checked today is 4, which is WNL.  Will continue to monitor as needed.  This is unlikely to be a cause of her diarrhea (overtreatment could cause diarrhea).

## 2014-02-21 LAB — STOOL CULTURE

## 2014-02-23 ENCOUNTER — Encounter: Payer: Self-pay | Admitting: Internal Medicine

## 2014-02-25 ENCOUNTER — Ambulatory Visit (INDEPENDENT_AMBULATORY_CARE_PROVIDER_SITE_OTHER): Payer: Medicare Other | Admitting: Dietician

## 2014-02-25 ENCOUNTER — Other Ambulatory Visit: Payer: Self-pay | Admitting: Internal Medicine

## 2014-02-25 DIAGNOSIS — E669 Obesity, unspecified: Secondary | ICD-10-CM

## 2014-02-25 NOTE — Patient Instructions (Addendum)
Please make an appointment for 2 weeks at the front desk.  The changes we you said you want to try to help you loose weight:   Eating smaller meals, Cereal and milk for breakfast, half a plate for lunch etc then adding in small snack three times a day in between meals.  Snacks- Activia or choose from list.   Keep moving as much as you can every day!

## 2014-02-25 NOTE — Progress Notes (Signed)
Medical Nutrition Therapy:  Appt start time: 1550 end time:  1630.  Assessment:  Primary concerns today: Weight management.  Patient reports weight gain roughly 100# in the last 10 years. Has attributed some weight gain to hormone replacement therapy.  Patient reports has tried food logs in the past and has not helped and did not want to retry using food records.  Class schedules has made meal times vary and exercise limited.  Monitors weight several times a week.  Difficulty staying asleep periodically.   Usual eating pattern includes 3 meals and snacks throughout day Frequent foods include baked fish, chicken, pork, cereal, low-fat milk.  Avoided foods include onions and shrimp.   24-hr recall: summer time  (Up at  AM) 9 or 10am B ( AM)-  Cereal with low fat-milk  L ( PM)- leftovers from the night before which include baked fish, chicken, or pork. Frozen vegetables and water   D ( PM)- same as lunch. Includes baked potato or bread,  Snk - throughout day but will drink an 8-12oz soda or sweet tea  Usual physical activity includes walking no more than 2 miles or riding bike with husband roughly 2 miles.  Progress Towards Goal(s):  In progress.   Nutritional Diagnosis:  NB-1.4 Self-monitoring deficit As related to caloric intake and portion sizes.  As evidenced by weight gain.    Intervention:  Nutrition educations for smaller portions and include snacks between meals.  Monitoring/Evaluation:  Dietary intake, exercise, and body weight in 2 week(s).

## 2014-03-03 ENCOUNTER — Other Ambulatory Visit: Payer: Self-pay | Admitting: Internal Medicine

## 2014-03-04 ENCOUNTER — Other Ambulatory Visit: Payer: Self-pay | Admitting: *Deleted

## 2014-03-04 MED ORDER — ALPRAZOLAM 0.25 MG PO TABS: 0.2500 mg | ORAL_TABLET | Freq: Every evening | ORAL | Status: DC | PRN

## 2014-03-04 NOTE — Telephone Encounter (Signed)
Xanax rx called to [facility].

## 2014-03-11 ENCOUNTER — Encounter: Payer: Medicare Other | Admitting: Dietician

## 2014-03-11 ENCOUNTER — Encounter: Payer: Self-pay | Admitting: Internal Medicine

## 2014-03-11 DIAGNOSIS — M999 Biomechanical lesion, unspecified: Secondary | ICD-10-CM | POA: Diagnosis not present

## 2014-03-11 DIAGNOSIS — M9981 Other biomechanical lesions of cervical region: Secondary | ICD-10-CM | POA: Diagnosis not present

## 2014-03-16 ENCOUNTER — Encounter: Payer: Self-pay | Admitting: Internal Medicine

## 2014-03-17 ENCOUNTER — Other Ambulatory Visit: Payer: Self-pay | Admitting: *Deleted

## 2014-03-17 ENCOUNTER — Ambulatory Visit (INDEPENDENT_AMBULATORY_CARE_PROVIDER_SITE_OTHER): Payer: Medicare Other | Admitting: Internal Medicine

## 2014-03-17 ENCOUNTER — Encounter: Payer: Self-pay | Admitting: Internal Medicine

## 2014-03-17 VITALS — BP 119/79 | HR 98 | Temp 98.4°F | Ht 61.0 in | Wt 214.5 lb

## 2014-03-17 DIAGNOSIS — M999 Biomechanical lesion, unspecified: Secondary | ICD-10-CM | POA: Diagnosis not present

## 2014-03-17 DIAGNOSIS — G43909 Migraine, unspecified, not intractable, without status migrainosus: Secondary | ICD-10-CM

## 2014-03-17 DIAGNOSIS — M9981 Other biomechanical lesions of cervical region: Secondary | ICD-10-CM | POA: Diagnosis not present

## 2014-03-17 DIAGNOSIS — R110 Nausea: Secondary | ICD-10-CM

## 2014-03-17 MED ORDER — PROMETHAZINE HCL 25 MG PO TABS
25.0000 mg | ORAL_TABLET | Freq: Once | ORAL | Status: AC
  Administered 2014-03-17: 25 mg via ORAL

## 2014-03-17 MED ORDER — BUTALBITAL-APAP-CAFFEINE 50-325-40 MG PO TABS: 1.0000 | ORAL_TABLET | Freq: Four times a day (QID) | ORAL | Status: DC | PRN

## 2014-03-17 MED ORDER — KETOROLAC TROMETHAMINE 30 MG/ML IJ SOLN
30.0000 mg | Freq: Once | INTRAMUSCULAR | Status: AC
  Administered 2014-03-17: 30 mg via INTRAMUSCULAR

## 2014-03-17 NOTE — Progress Notes (Signed)
Subjective:   Patient ID: Donna Davis female   DOB: 02-12-1971 43 y.o.   MRN: [id number]  HPI: [name] is a 43 y.o. 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  Patient didn't bring her hearing aid today and she understands by lip reading.   Patient reports that she has been struggling with "migraine" since Saturday. She took one and a half tablet of imitrex on saturday and then the other half an Imatrex Sunday. It helped "some" but did not eliminate the headache as in the past. She thinks that this migraine has added symptoms of muscle and spine soreness. The head aches are mainly located on the right side of the head, extending from front to back, worsened by moving the head, cough or sneeze slightly relieved by imitrex. She also reports that has nausea and vomiting occasionally. She denies photophobia, diplopia or blurred vision. She denies any weakness of the extremities, tingling/numbness of there extremities. She denies any runny nose. She reports that her mom is having a surgery in the hospital today and also states that she is stressed out lately.  She denies any other complaints during this office visit.  Past Medical History  Diagnosis Date  . GERD (gastroesophageal reflux disease)   . Hypothyroidism   . IBS (irritable bowel syndrome)   . Anxiety   . Hearing difficulty     b/l, 2/2 congenital rubella s/p stapedectomy. Using hearing aid  . Headache(784.0) 04/2005    h/o headache in past that raised question of psuedotumor s/p therapeutic LP with an opening preassure of 18 cm H2O   . Hepatic cyst     6 mm on CT done done in 05/2005  . Family history of malignant neoplasm of gastrointestinal tract   . Shingles 03/2013   Current Outpatient Prescriptions  Medication Sig Dispense Refill  . ALPRAZolam (XANAX) 0.25 MG tablet Take  1 tablet (0.25 mg total) by mouth at bedtime as needed for anxiety.  30 tablet  1  . ascorbic acid (VITAMIN C) 1000 MG tablet Take 1,000 mg by mouth 2 (two) times daily.      . Calcium Carbonate Antacid (ANTACID PO) Take 1 tablet by mouth daily as needed (heartburn).      Marland Kitchen levothyroxine (SYNTHROID, LEVOTHROID) 25 MCG tablet Take 1 tablet (25 mcg total) by mouth daily.  31 tablet  11  . Norethindrone-Ethinyl Estradiol-Fe Biphas (LO LOESTRIN FE) 1 MG-10 MCG / 10 MCG tablet Take 1 tablet by mouth daily.  3 Package  1  . Omega-3 Fatty Acids (FISH OIL) 1000 MG CAPS Take 2,000 mg by mouth 2 (two) times daily.      . SUMAtriptan (IMITREX) 100 MG tablet Take 100 mg by mouth every 2 (two) hours as needed for migraine or headache. May repeat in 2 hours if headache persists or recurs.       No current facility-administered medications for this visit.   Family History  Problem Relation Age of Onset  . Breast cancer Mother   . Ovarian cancer Mother   . Diabetes Mother   . Stomach cancer Maternal Grandmother   . Colon cancer Maternal Grandmother   . Diabetes Maternal Grandmother   . Pancreatic cancer Father   . Prostate cancer Father   . Colon cancer Father   . Diabetes Father   . Heart disease  Father   . Irritable bowel syndrome Father   . Kidney disease Father   . Heart disease      both grandmothers  . Bipolar disorder Daughter    History   Social History  . Marital Status: Married    Spouse Name: N/A    Number of Children: 2  . Years of Education: N/A   Occupational History  . student    Social History Main Topics  . Smoking status: Former Smoker    Types: Cigarettes    Quit date: 02/18/2008  . Smokeless tobacco: Never Used     Comment: quit 65yrs ago  . Alcohol Use: No  . Drug Use: No  . Sexual Activity: Yes    Birth Control/ Protection: Surgical   Other Topics Concern  . None   Social History Narrative   Current smoker, not using alcohol or drugs at this time   Lives  with husband and 2 children   Review of Systems: Pertinent items are noted in HPI. Objective:  Physical Exam: Filed Vitals:   03/17/14 1114  BP: 119/79  Pulse: 98  Temp: 98.4 F (36.9 C)  TempSrc: Oral  Height: 5\' 1"  (1.549 m)  Weight: 214 lb 8 oz (97.297 kg)  SpO2: 97%   Constitutional: Vital signs reviewed.   Patient is a well-developed and well-nourished and is in mild distress secondary to headache and cooperative with exam. Patient is deaf and didn't bring her hearing aid, but understands conversation by lip reading.  Head: Normocephalic and atraumatic Ear: TM normal bilaterally. Mild cerumen noted in left ear. Nose: No erythema or drainage noted.  Turbinates normal Mouth: no erythema or exudates, MMM Eyes: EOMI intact and pupils equal and reactive. Neck: Supple. No rigidity noted. Cardiovascular: RRR, S1 normal, S2 normal, no MRG Pulmonary/Chest: normal respiratory effort, CTAB, no wheezes, rales, or rhonchi Neurological: A&O x3, Strength is normal and symmetric bilaterally. CN III, IV, VI are intact.   Assessment & Plan:

## 2014-03-17 NOTE — Patient Instructions (Signed)
If the head ache returns/ persists, please call the clinic number. If it is after hours, you can talk to the on-call resident. Take Imitrex as advised before. Migraine Headache A migraine headache is an intense, throbbing pain on one or both sides of your head. A migraine can last for 30 minutes to several hours. CAUSES  The exact cause of a migraine headache is not always known. However, a migraine may be caused when nerves in the brain become irritated and release chemicals that cause inflammation. This causes pain. Certain things may also trigger migraines, such as:  Alcohol.  Smoking.  Stress.  Menstruation.  Aged cheeses.  Foods or drinks that contain nitrates, glutamate, aspartame, or tyramine.  Lack of sleep.  Chocolate.  Caffeine.  Hunger.  Physical exertion.  Fatigue.  Medicines used to treat chest pain (nitroglycerine), birth control pills, estrogen, and some blood pressure medicines. SIGNS AND SYMPTOMS  Pain on one or both sides of your head.  Pulsating or throbbing pain.  Severe pain that prevents daily activities.  Pain that is aggravated by any physical activity.  Nausea, vomiting, or both.  Dizziness.  Pain with exposure to bright lights, loud noises, or activity.  General sensitivity to bright lights, loud noises, or smells. Before you get a migraine, you may get warning signs that a migraine is coming (aura). An aura may include:  Seeing flashing lights.  Seeing bright spots, halos, or zig-zag lines.  Having tunnel vision or blurred vision.  Having feelings of numbness or tingling.  Having trouble talking.  Having muscle weakness. DIAGNOSIS  A migraine headache is often diagnosed based on:  Symptoms.  Physical exam.  A CT scan or MRI of your head. These imaging tests cannot diagnose migraines, but they can help rule out other causes of headaches. TREATMENT Medicines may be given for pain and nausea. Medicines can also be given  to help prevent recurrent migraines.  HOME CARE INSTRUCTIONS  Only take over-the-counter or prescription medicines for pain or discomfort as directed by your health care provider. The use of long-term narcotics is not recommended.  Lie down in a dark, quiet room when you have a migraine.  Keep a journal to find out what may trigger your migraine headaches. For example, write down:  What you eat and drink.  How much sleep you get.  Any change to your diet or medicines.  Limit alcohol consumption.  Quit smoking if you smoke.  Get 7-9 hours of sleep, or as recommended by your health care provider.  Limit stress.  Keep lights dim if bright lights bother you and make your migraines worse. SEEK IMMEDIATE MEDICAL CARE IF:   Your migraine becomes severe.  You have a fever.  You have a stiff neck.  You have vision loss.  You have muscular weakness or loss of muscle control.  You start losing your balance or have trouble walking.  You feel faint or pass out.  You have severe symptoms that are different from your first symptoms. MAKE SURE YOU:   Understand these instructions.  Will watch your condition.  Will get help right away if you are not doing well or get worse. Document Released: 09/03/2005 Document Revised: 06/24/2013 Document Reviewed: 05/11/2013 [facility] Patient Information 2015 [place], [place]. This information is not intended to replace advice given to you by your health care provider. Make sure you discuss any questions you have with your health care provider.

## 2014-03-17 NOTE — Assessment & Plan Note (Signed)
Clinically, symptoms suggestive of an acute attack of common migraine headache without aura. Differentials include tension type headache (Patient reports anxiety, domestic stress, ?inadequate rest). Symptoms resolved partially with abortive therapy with Triptans (Sumatriptan 100 mg). Unable to repeat the dose of Sumatriptan as the recommended maximal dose per day is 200 mg/day. Patient is currently not on any prophylactic therapy as she was unable to tolerate several prophylactic therapies in the past such as - Topiramate, Venlafaxine, Amitriptyline, Propranolol etc. Discussed with the attending regarding further management.  Plans: Toradol 30 mg IM X 1 in the clinic. Prochlorperazine 25 mg po x 1 in the clinic. Recommended patient to call me around 4 PM if the headache does not resolve.  Addendum: Patient called around 4 pm and stated that the nausea and vomiting resolved and headache improved slightly but not completely. Called the provided mobile number and spoke to her husband. Start Butalbital-Acetaminophen-Caffeine 50-325-40 mg po q6prn for headache. Prescription called in to the pharmacy listed on her account. Recommended to start it tonight and to give me a call in the morning if it does not get better.

## 2014-03-18 ENCOUNTER — Encounter: Payer: Self-pay | Admitting: Internal Medicine

## 2014-03-18 ENCOUNTER — Other Ambulatory Visit: Payer: Self-pay | Admitting: Internal Medicine

## 2014-03-18 DIAGNOSIS — M999 Biomechanical lesion, unspecified: Secondary | ICD-10-CM | POA: Diagnosis not present

## 2014-03-18 DIAGNOSIS — M9981 Other biomechanical lesions of cervical region: Secondary | ICD-10-CM | POA: Diagnosis not present

## 2014-03-18 NOTE — Progress Notes (Signed)
Case discussed with Dr. Boggala at the time of the visit.  We reviewed the resident's history and exam and pertinent patient test results.  I agree with the assessment, diagnosis, and plan of care documented in the resident's note. 

## 2014-03-22 ENCOUNTER — Telehealth: Payer: Self-pay | Admitting: *Deleted

## 2014-03-22 DIAGNOSIS — N951 Menopausal and female climacteric states: Secondary | ICD-10-CM

## 2014-03-22 NOTE — Telephone Encounter (Addendum)
Voice message left by [place] requesting refill for Loloestrin fe 1/10 gets 3 packs at a time and Dr. Is Harraway-Smith.

## 2014-03-24 DIAGNOSIS — M9981 Other biomechanical lesions of cervical region: Secondary | ICD-10-CM | POA: Diagnosis not present

## 2014-03-24 DIAGNOSIS — M999 Biomechanical lesion, unspecified: Secondary | ICD-10-CM | POA: Diagnosis not present

## 2014-03-24 MED ORDER — NORETHIN-ETH ESTRAD-FE BIPHAS 1 MG-10 MCG / 10 MCG PO TABS: 1.0000 | ORAL_TABLET | Freq: Every day | ORAL | Status: DC

## 2014-03-24 NOTE — Telephone Encounter (Signed)
Per GYN protocol. Rx sent to pharmacy.

## 2014-03-30 ENCOUNTER — Other Ambulatory Visit: Payer: Self-pay | Admitting: Family Medicine

## 2014-03-30 DIAGNOSIS — N951 Menopausal and female climacteric states: Secondary | ICD-10-CM

## 2014-03-31 NOTE — Telephone Encounter (Signed)
Called pts pharmacy @ [phone] and informed them she would need to contact her PCP for a refill.  Pharmacy agreed.

## 2014-04-05 ENCOUNTER — Other Ambulatory Visit: Payer: Self-pay | Admitting: Internal Medicine

## 2014-04-13 ENCOUNTER — Encounter: Payer: Self-pay | Admitting: Internal Medicine

## 2014-04-13 DIAGNOSIS — R0609 Other forms of dyspnea: Principal | ICD-10-CM

## 2014-04-14 ENCOUNTER — Other Ambulatory Visit: Payer: Self-pay | Admitting: Internal Medicine

## 2014-04-15 DIAGNOSIS — M999 Biomechanical lesion, unspecified: Secondary | ICD-10-CM | POA: Diagnosis not present

## 2014-04-15 DIAGNOSIS — M9981 Other biomechanical lesions of cervical region: Secondary | ICD-10-CM | POA: Diagnosis not present

## 2014-04-16 DIAGNOSIS — R0609 Other forms of dyspnea: Principal | ICD-10-CM | POA: Insufficient documentation

## 2014-04-16 NOTE — Telephone Encounter (Signed)
[name] - - -  I have ordered PFTs for Donna Davis, can you coordinate scheduling these for her please?   Thank you!

## 2014-05-05 ENCOUNTER — Encounter: Payer: Self-pay | Admitting: Internal Medicine

## 2014-05-12 ENCOUNTER — Encounter (HOSPITAL_COMMUNITY): Payer: Medicare Other

## 2014-05-12 ENCOUNTER — Ambulatory Visit (HOSPITAL_COMMUNITY)
Admission: RE | Admit: 2014-05-12 | Discharge: 2014-05-12 | Disposition: A | Discharge status: Home / Self Care | Payer: Medicare Other | Source: Ambulatory Visit | Attending: Internal Medicine | Admitting: Internal Medicine

## 2014-05-12 DIAGNOSIS — R0609 Other forms of dyspnea: Secondary | ICD-10-CM | POA: Diagnosis not present

## 2014-05-12 DIAGNOSIS — R0989 Other specified symptoms and signs involving the circulatory and respiratory systems: Principal | ICD-10-CM | POA: Insufficient documentation

## 2014-05-12 LAB — PULMONARY FUNCTION TEST
FEF 25-75 Post: 0.81 L/sec
FEF 25-75 Pre: 2.19 L/sec
FEF2575-%CHANGE-POST: -62 %
FEF2575-%Pred-Post: 28 %
FEF2575-%Pred-Pre: 76 %
FEV1-%Change-Post: -37 %
FEV1-%PRED-POST: 43 %
FEV1-%PRED-PRE: 70 %
FEV1-POST: 1.23 L
FEV1-PRE: 1.97 L
FEV1FVC-%CHANGE-POST: -36 %
FEV1FVC-%PRED-PRE: 99 %
FEV6-%CHANGE-POST: -1 %
FEV6-%PRED-PRE: 71 %
FEV6-%Pred-Post: 70 %
FEV6-Post: 2.4 L
FEV6-Pre: 2.44 L
FEV6FVC-%Pred-Post: 102 %
FEV6FVC-%Pred-Pre: 102 %
FVC-%CHANGE-POST: -1 %
FVC-%Pred-Post: 68 %
FVC-%Pred-Pre: 69 %
FVC-PRE: 2.44 L
FVC-Post: 2.4 L
POST FEV1/FVC RATIO: 51 %
PRE FEV6/FVC RATIO: 100 %
Post FEV6/FVC ratio: 100 %
Pre FEV1/FVC ratio: 81 %
RV % PRED: 45 %
RV: 0.75 L
TLC % PRED: 66 %
TLC: 3.27 L

## 2014-05-12 MED ORDER — ALBUTEROL SULFATE (2.5 MG/3ML) 0.083% IN NEBU
2.5000 mg | INHALATION_SOLUTION | Freq: Once | RESPIRATORY_TRACT | Status: AC
  Administered 2014-05-12: 2.5 mg via RESPIRATORY_TRACT

## 2014-05-17 ENCOUNTER — Encounter: Payer: Self-pay | Admitting: Internal Medicine

## 2014-05-17 ENCOUNTER — Other Ambulatory Visit: Payer: Self-pay | Admitting: Internal Medicine

## 2014-05-17 DIAGNOSIS — R0609 Other forms of dyspnea: Principal | ICD-10-CM

## 2014-05-19 DIAGNOSIS — M999 Biomechanical lesion, unspecified: Secondary | ICD-10-CM | POA: Diagnosis not present

## 2014-05-19 DIAGNOSIS — M9981 Other biomechanical lesions of cervical region: Secondary | ICD-10-CM | POA: Diagnosis not present

## 2014-05-20 ENCOUNTER — Emergency Department (HOSPITAL_COMMUNITY)
Admission: EM | Admit: 2014-05-20 | Discharge: 2014-05-21 | Disposition: A | Discharge status: Home / Self Care | Payer: Medicare Other | Attending: Emergency Medicine | Admitting: Emergency Medicine

## 2014-05-20 ENCOUNTER — Encounter (HOSPITAL_COMMUNITY): Payer: Self-pay | Admitting: Emergency Medicine

## 2014-05-20 DIAGNOSIS — Z79899 Other long term (current) drug therapy: Secondary | ICD-10-CM | POA: Diagnosis not present

## 2014-05-20 DIAGNOSIS — E039 Hypothyroidism, unspecified: Secondary | ICD-10-CM | POA: Diagnosis not present

## 2014-05-20 DIAGNOSIS — R51 Headache: Secondary | ICD-10-CM | POA: Diagnosis not present

## 2014-05-20 DIAGNOSIS — R110 Nausea: Secondary | ICD-10-CM | POA: Diagnosis not present

## 2014-05-20 DIAGNOSIS — K219 Gastro-esophageal reflux disease without esophagitis: Secondary | ICD-10-CM | POA: Insufficient documentation

## 2014-05-20 DIAGNOSIS — H53149 Visual discomfort, unspecified: Secondary | ICD-10-CM | POA: Diagnosis not present

## 2014-05-20 DIAGNOSIS — M9981 Other biomechanical lesions of cervical region: Secondary | ICD-10-CM | POA: Diagnosis not present

## 2014-05-20 DIAGNOSIS — Z880 Allergy status to penicillin: Secondary | ICD-10-CM | POA: Insufficient documentation

## 2014-05-20 DIAGNOSIS — Z8659 Personal history of other mental and behavioral disorders: Secondary | ICD-10-CM | POA: Diagnosis not present

## 2014-05-20 DIAGNOSIS — H9190 Unspecified hearing loss, unspecified ear: Secondary | ICD-10-CM | POA: Diagnosis not present

## 2014-05-20 DIAGNOSIS — Z87891 Personal history of nicotine dependence: Secondary | ICD-10-CM | POA: Insufficient documentation

## 2014-05-20 DIAGNOSIS — M999 Biomechanical lesion, unspecified: Secondary | ICD-10-CM | POA: Diagnosis not present

## 2014-05-20 MED ORDER — SODIUM CHLORIDE 0.9 % IV BOLUS (SEPSIS)
1000.0000 mL | Freq: Once | INTRAVENOUS | Status: AC
  Administered 2014-05-20: 1000 mL via INTRAVENOUS

## 2014-05-20 MED ORDER — DIPHENHYDRAMINE HCL 50 MG/ML IJ SOLN
25.0000 mg | Freq: Once | INTRAMUSCULAR | Status: DC
  Filled 2014-05-20: qty 1

## 2014-05-20 MED ORDER — KETOROLAC TROMETHAMINE 30 MG/ML IJ SOLN
30.0000 mg | Freq: Once | INTRAMUSCULAR | Status: AC
  Administered 2014-05-20: 30 mg via INTRAMUSCULAR
  Filled 2014-05-20: qty 1

## 2014-05-20 MED ORDER — BUTALBITAL-APAP-CAFFEINE 50-325-40 MG PO TABS
1.0000 | ORAL_TABLET | Freq: Once | ORAL | Status: DC
  Filled 2014-05-20: qty 1

## 2014-05-20 MED ORDER — PROMETHAZINE HCL 25 MG PO TABS
25.0000 mg | ORAL_TABLET | ORAL | Status: AC
  Administered 2014-05-20: 25 mg via ORAL
  Filled 2014-05-20: qty 1

## 2014-05-20 MED ORDER — METOCLOPRAMIDE HCL 5 MG/ML IJ SOLN
10.0000 mg | INTRAMUSCULAR | Status: DC
  Filled 2014-05-20: qty 2

## 2014-05-20 NOTE — ED Notes (Signed)
Pt complains of a migraine that started yesterday, she took an imitrex and it went away until this morning, she states the pain is on her right side

## 2014-05-20 NOTE — ED Provider Notes (Signed)
CSN: [id number]     Arrival date & time 05/20/14  1954 History   First MD Initiated Contact with Patient 05/20/14 2058     Chief Complaint  Patient presents with  . Migraine     (Consider location/radiation/quality/duration/timing/severity/associated sxs/prior Treatment) HPI Comments: Donna Davis is a 27% year-old female with a past medical history of hearing impairment, GERD, IBS, presenting the Emergency Department with a chief complaint of right-sided headache for 2 days. The patient reports gradual onset of right-sided headache.  The patient reports taking 1/2 of Imitrex with resolution of symptoms. However, the patient reports upon waking this morning return of right-sided discomfort after bending over. She reports similar to previous headaches. She reports nausea without emesis, she reports right eye photophobia, denies visual disturbance. Denies fever, chills.  The history is provided by the patient. A language interpreter was used.    Past Medical History  Diagnosis Date  . GERD (gastroesophageal reflux disease)   . Hypothyroidism   . IBS (irritable bowel syndrome)   . Anxiety   . Hearing difficulty     b/l, 2/2 congenital rubella s/p stapedectomy. Using hearing aid  . Headache(784.0) 04/2005    h/o headache in past that raised question of psuedotumor s/p therapeutic LP with an opening preassure of 18 cm H2O   . Hepatic cyst     6 mm on CT done done in 05/2005  . Family history of malignant neoplasm of gastrointestinal tract   . Shingles 03/2013   Past Surgical History  Procedure Laterality Date  . Abdominal hysterectomy      partial hysterecotmy 2003 for endoemtriosis with residula right ovaey. Multiple GYN surgeries for chronic pelvic pain  . Cesarean section  1994  . Stapedectomy  2007    Dr. Lucia Gaskins, left ear  . Laparoscopic ovarian cystectomy  2001  . Thyroid nodule removed  1997  . Total abdominal hysterectomy w/ bilateral salpingoophorectomy      left  . Abdominal  adhesion surgery      removal from bowel  . Joint replacement    . Cholecystectomy N/A 06/26/2013    Procedure: LAPAROSCOPIC CHOLECYSTECTOMY WITH ATTEMPTED INTRAOPERATIVE CHOLANGIOGRAM;  Surgeon: Harl Bowie, MD;  Location: [facility];  Service: General;  Laterality: N/A;   Family History  Problem Relation Age of Onset  . Breast cancer Mother   . Ovarian cancer Mother   . Diabetes Mother   . Stomach cancer Maternal Grandmother   . Colon cancer Maternal Grandmother   . Diabetes Maternal Grandmother   . Pancreatic cancer Father   . Prostate cancer Father   . Colon cancer Father   . Diabetes Father   . Heart disease Father   . Irritable bowel syndrome Father   . Kidney disease Father   . Heart disease      both grandmothers  . Bipolar disorder Daughter    History  Substance Use Topics  . Smoking status: Former Smoker    Types: Cigarettes    Quit date: 02/18/2008  . Smokeless tobacco: Never Used     Comment: quit 20yrs ago  . Alcohol Use: No   OB History   Grav Para Term Preterm Abortions TAB SAB Ect Mult Living   3 2 2  1 1    2      Review of Systems  Constitutional: Negative for fever and chills.  Eyes: Positive for photophobia. Negative for visual disturbance.  Gastrointestinal: Positive for nausea. Negative for vomiting, abdominal pain and diarrhea.  Neurological: Positive for headaches. Negative for syncope, weakness and numbness.      Allergies  Onion; Shellfish allergy; Amitriptyline; Amoxicillin-pot clavulanate; Cefuroxime; Cephalexin; Ciprofloxacin; Clarithromycin; Doxycycline; Prednisone; Propranolol; Shrimp flavor; Sulfonamide derivatives; and Benadryl  Home Medications   Prior to Admission medications   Medication Sig Start Date End Date Taking? Authorizing Provider  ALPRAZolam (XANAX) 0.25 MG tablet Take 1 tablet (0.25 mg total) by mouth at bedtime as needed for anxiety. 03/04/14   Sid Falcon, MD  ascorbic acid (VITAMIN C) 1000 MG tablet Take  1,000 mg by mouth 2 (two) times daily.    Historical Provider, MD  butalbital-acetaminophen-caffeine (FIORICET) 50-325-40 MG per tablet Take 1 tablet by mouth every 6 (six) hours as needed for headache. 03/17/14 03/17/15  Malena Catholic, MD  Calcium Carbonate Antacid (ANTACID PO) Take 1 tablet by mouth daily as needed (heartburn).    Historical Provider, MD  levothyroxine (SYNTHROID, LEVOTHROID) 25 MCG tablet TAKE 1 TABLET BY MOUTH DAILY    Sid Falcon, MD  Norethindrone-Ethinyl Estradiol-Fe Biphas (LO LOESTRIN FE) 1 MG-10 MCG / 10 MCG tablet Take 1 tablet by mouth daily. 03/24/14   Kassie Mends, MD  Omega-3 Fatty Acids (FISH OIL) 1000 MG CAPS Take 2,000 mg by mouth 2 (two) times daily.    Historical Provider, MD  SUMAtriptan (IMITREX) 100 MG tablet Take 100 mg by mouth every 2 (two) hours as needed for migraine or headache. May repeat in 2 hours if headache persists or recurs.    Historical Provider, MD   BP 142/88  Pulse 98  Temp(Src) 99.3 F (37.4 C) (Oral)  Resp 16  SpO2 97%  LMP 12/26/2004 Physical Exam  Nursing note and vitals reviewed. Constitutional: She is oriented to person, place, and time. She appears well-developed and well-nourished. No distress.  HENT:  Head: Normocephalic and atraumatic.  Eyes: EOM are normal. Pupils are equal, round, and reactive to light.  Neck: Neck supple.  Cardiovascular: Normal rate and regular rhythm.   Pulmonary/Chest: Effort normal and breath sounds normal. She has no wheezes. She has no rales.  Neurological: She is alert and oriented to person, place, and time.  Speech is clear and goal oriented, follows commands Cranial nerves III -VII, IX-XII grossly intact, CN VIII not tested due 2 history of deafness. no facial droop Normal strength in upper and lower extremities bilaterally, strong and equal grip strength Sensation normal to light and sharp touch Moves all 4 extremities without ataxia, coordination intact Normal finger to nose and  rapid alternating movements No pronator drift  Skin: Skin is warm and dry. She is not diaphoretic.  Psychiatric: She has a normal mood and affect. Her behavior is normal.    ED Course  Procedures (including critical care time) Labs Review Labs Reviewed - No data to display  Imaging Review No results found.   EKG Interpretation None      MDM   Final diagnoses:  Headache(784.0)   Patient presents with headache, similar to previous, afebrile, no neurologic deficits, full range of motion to neck. Presentation is like pts typical HA and non concerning for [facility], ICH, Meningitis, or temporal arteritis. Reevaluation patient reports one episodes of vomiting after medication, R.N. notified provider patient declined fioricet. EMR shows patient has had Benadryl in the past, Benadryl and Reglan ordered. Benadryl and Reglan with her not given to the patient do to adverse reaction. Imitrex injection ordered. 1:29 AM Re-evaluation pt reports no relief with medication. She reports myalgias and feeling like  head is on fire. Will obtain labs, reglan ordered, no benadryl given pt adverse reaction. Pt care assumed by Hazel Sams, PA-C at shift change. Plan re-evaluate and likely discharge home.  Meds given in ED:  Medications  butalbital-acetaminophen-caffeine (FIORICET, ESGIC) 50-325-40 MG per tablet 1 tablet (1 tablet Oral Not Given 05/20/14 2205)  ketorolac (TORADOL) 30 MG/ML injection 30 mg (30 mg Intramuscular Given 05/20/14 2203)  promethazine (PHENERGAN) tablet 25 mg (25 mg Oral Given 05/20/14 2202)  sodium chloride 0.9 % bolus 1,000 mL (1,000 mLs Intravenous New Bag/Given 05/20/14 2358)  SUMAtriptan (IMITREX) injection 6 mg (6 mg Subcutaneous Given 05/21/14 0054)    New Prescriptions   No medications on file       Harvie Heck, PA-C 05/21/14 1527

## 2014-05-21 LAB — BASIC METABOLIC PANEL
ANION GAP: 13 (ref 5–15)
BUN: 9 mg/dL (ref 6–23)
CO2: 21 mEq/L (ref 19–32)
CREATININE: 0.7 mg/dL (ref 0.50–1.10)
Calcium: 8.8 mg/dL (ref 8.4–10.5)
Chloride: 106 mEq/L (ref 96–112)
GFR calc Af Amer: >90 mL/min (ref >90)
GFR calc non Af Amer: >90 mL/min (ref >90)
Glucose, Bld: 95 mg/dL (ref 70–99)
Potassium: 4 mEq/L (ref 3.7–5.3)
Sodium: 140 mEq/L (ref 137–147)

## 2014-05-21 LAB — CBC WITH DIFFERENTIAL/PLATELET
BASOS ABS: 0 10*3/uL (ref 0.0–0.1)
Basophils Relative: 0 % (ref 0–1)
Eosinophils Absolute: 0.2 10*3/uL (ref 0.0–0.7)
Eosinophils Relative: 2 % (ref 0–5)
HCT: 38.4 % (ref 36.0–46.0)
HEMOGLOBIN: 13 g/dL (ref 12.0–15.0)
LYMPHS PCT: 39 % (ref 12–46)
Lymphs Abs: 3.8 10*3/uL (ref 0.7–4.0)
MCH: 30.2 pg (ref 26.0–34.0)
MCHC: 33.9 g/dL (ref 30.0–36.0)
MCV: 89.1 fL (ref 78.0–100.0)
MONO ABS: 0.6 10*3/uL (ref 0.1–1.0)
Monocytes Relative: 6 % (ref 3–12)
Neutro Abs: 5 10*3/uL (ref 1.7–7.7)
Neutrophils Relative %: 53 % (ref 43–77)
Platelets: 267 10*3/uL (ref 150–400)
RBC: 4.31 MIL/uL (ref 3.87–5.11)
RDW: 12.1 % (ref 11.5–15.5)
WBC: 9.6 10*3/uL (ref 4.0–10.5)

## 2014-05-21 MED ORDER — SODIUM CHLORIDE 0.9 % IV BOLUS (SEPSIS): 500.0000 mL | Freq: Once | INTRAVENOUS | Status: DC

## 2014-05-21 MED ORDER — SUMATRIPTAN SUCCINATE 6 MG/0.5ML ~~LOC~~ SOLN
6.0000 mg | Freq: Once | SUBCUTANEOUS | Status: AC
  Administered 2014-05-21: 6 mg via SUBCUTANEOUS
  Filled 2014-05-21: qty 0.5

## 2014-05-21 MED ORDER — METOCLOPRAMIDE HCL 5 MG/ML IJ SOLN: 10.0000 mg | INTRAMUSCULAR | Status: DC

## 2014-05-21 NOTE — Discharge Instructions (Signed)
Please followup with primary care provider or neurology specialist for continued evaluation and treatment of your headache. Return any time for changing or worsening symptoms.     General Headache Without Cause A headache is pain or discomfort felt around the head or neck area. The specific cause of a headache may not be found. There are many causes and types of headaches. A few common ones are:  Tension headaches.  Migraine headaches.  Cluster headaches.  Chronic daily headaches. HOME CARE INSTRUCTIONS   Keep all follow-up appointments with your caregiver or any specialist referral.  Only take over-the-counter or prescription medicines for pain or discomfort as directed by your caregiver.  Lie down in a dark, quiet room when you have a headache.  Keep a headache journal to find out what may trigger your migraine headaches. For example, write down:  What you eat and drink.  How much sleep you get.  Any change to your diet or medicines.  Try massage or other relaxation techniques.  Put ice packs or heat on the head and neck. Use these 3 to 4 times per day for 15 to 20 minutes each time, or as needed.  Limit stress.  Sit up straight, and do not tense your muscles.  Quit smoking if you smoke.  Limit alcohol use.  Decrease the amount of caffeine you drink, or stop drinking caffeine.  Eat and sleep on a regular schedule.  Get 7 to 9 hours of sleep, or as recommended by your caregiver.  Keep lights dim if bright lights bother you and make your headaches worse. SEEK MEDICAL CARE IF:   You have problems with the medicines you were prescribed.  Your medicines are not working.  You have a change from the usual headache.  You have nausea or vomiting. SEEK IMMEDIATE MEDICAL CARE IF:   Your headache becomes severe.  You have a fever.  You have a stiff neck.  You have loss of vision.  You have muscular weakness or loss of muscle control.  You start losing your  balance or have trouble walking.  You feel faint or pass out.  You have severe symptoms that are different from your first symptoms. MAKE SURE YOU:   Understand these instructions.  Will watch your condition.  Will get help right away if you are not doing well or get worse. Document Released: 09/03/2005 Document Revised: 11/26/2011 Document Reviewed: 09/19/2011 [facility] Patient Information 2015 [place], [place]. This information is not intended to replace advice given to you by your health care provider. Make sure you discuss any questions you have with your health care provider.

## 2014-05-21 NOTE — ED Provider Notes (Signed)
Medical screening examination/treatment/procedure(s) were performed by non-physician practitioner and as supervising physician I was immediately available for consultation/collaboration.   EKG Interpretation None       Virgel Manifold, MD 05/21/14 [phone]

## 2014-05-21 NOTE — ED Provider Notes (Signed)
Donna Davis S 1:00AM patient discussed and signout. Patient with history of migraine headaches presenting with similar type headache. Headache not improved with medicines at home. I am injection of Imitrex given however patient has worsening headache as well as new pain and ache in the body. Additional medications ordered as well as basic labs. Plan to followup on patient's condition and reassess.  2:15 AM patient has refused additional medications from the nurse. She continues to feel poorly without any improvement of headache. She is requesting to leave at this time. She does not wish to have any further treatment. Patient was dissatisfied with her visit unhappy that she is feeling worse. I did try to offer her additional medications to try to improve her symptoms and apologize for her long stay. She still did not wish to stay. She was advised to return any time if she changes her mind. Also given strict return precautions if headache worsens or symptoms change.    Martie Lee, PA-C 05/21/14 0221

## 2014-05-23 NOTE — ED Provider Notes (Signed)
Medical screening examination/treatment/procedure(s) were performed by non-physician practitioner and as supervising physician I was immediately available for consultation/collaboration.  Leota Jacobsen, MD 05/23/14 [phone]

## 2014-05-25 ENCOUNTER — Encounter: Payer: Self-pay | Admitting: Internal Medicine

## 2014-05-25 ENCOUNTER — Other Ambulatory Visit: Payer: Self-pay | Admitting: *Deleted

## 2014-05-25 MED ORDER — ALPRAZOLAM 0.25 MG PO TABS: 0.2500 mg | ORAL_TABLET | Freq: Every evening | ORAL | Status: DC | PRN

## 2014-05-26 DIAGNOSIS — M9981 Other biomechanical lesions of cervical region: Secondary | ICD-10-CM | POA: Diagnosis not present

## 2014-05-26 DIAGNOSIS — M999 Biomechanical lesion, unspecified: Secondary | ICD-10-CM | POA: Diagnosis not present

## 2014-05-26 NOTE — Telephone Encounter (Signed)
Called to pharm 

## 2014-05-28 DIAGNOSIS — M9981 Other biomechanical lesions of cervical region: Secondary | ICD-10-CM | POA: Diagnosis not present

## 2014-05-28 DIAGNOSIS — M999 Biomechanical lesion, unspecified: Secondary | ICD-10-CM | POA: Diagnosis not present

## 2014-06-03 ENCOUNTER — Encounter: Payer: Self-pay | Admitting: *Deleted

## 2014-06-18 DIAGNOSIS — M5033 Other cervical disc degeneration, cervicothoracic region: Secondary | ICD-10-CM | POA: Diagnosis not present

## 2014-06-18 DIAGNOSIS — M531 Cervicobrachial syndrome: Secondary | ICD-10-CM | POA: Diagnosis not present

## 2014-06-18 DIAGNOSIS — M9901 Segmental and somatic dysfunction of cervical region: Secondary | ICD-10-CM | POA: Diagnosis not present

## 2014-06-18 DIAGNOSIS — R202 Paresthesia of skin: Secondary | ICD-10-CM | POA: Diagnosis not present

## 2014-06-23 ENCOUNTER — Telehealth: Payer: Self-pay | Admitting: *Deleted

## 2014-06-23 DIAGNOSIS — H66003 Acute suppurative otitis media without spontaneous rupture of ear drum, bilateral: Secondary | ICD-10-CM | POA: Diagnosis not present

## 2014-06-23 DIAGNOSIS — N951 Menopausal and female climacteric states: Secondary | ICD-10-CM

## 2014-06-23 DIAGNOSIS — R05 Cough: Secondary | ICD-10-CM | POA: Diagnosis not present

## 2014-06-23 NOTE — Telephone Encounter (Signed)
Called Donna Davis thru video relay service- left  A message for her to either leave [place] a message or have her pharmacy to contact us regarding which medicine she needs the refill for( either estradiol or lo-estrin.

## 2014-06-23 NOTE — Telephone Encounter (Signed)
Meribeth left a message thru a video relay service stating she is calling about needing a refill - states can you call [facility] at [phone] about a refill. If you have questions may call me but I will be in and out of classes today.

## 2014-06-25 NOTE — Telephone Encounter (Signed)
Called Donna Davis with [employer] and left another message telling her to call us and leave a message of what medicine specifically that she needs a refill, that we cannot call [facility].

## 2014-06-28 MED ORDER — NORETHIN-ETH ESTRAD-FE BIPHAS 1 MG-10 MCG / 10 MCG PO TABS
1.0000 | ORAL_TABLET | Freq: Every day | ORAL | Status: DC
Start: 2014-06-28 — End: 2015-04-28

## 2014-06-28 NOTE — Telephone Encounter (Signed)
Shadawn left another message thru relay that she goes to [facility] and both [facility] and she have called several times . Also she said we have called her for name of rx, but states she left that, but will leave it again- wants refill on lo-loestrin for 3 month supply.  Has been out a week now.   Per review patient last seen in our clinic 03/2013 for galactorrhea, night sweats, hot flashes prescribed estradiol until gets insurance then loloestrin which worked better for her.  Had appointment 2/ 2015 but did not see doctor to frustration over ovary issue.  Per Dr. Harolyn Rutherford may have one year refill, but needs to have annual exam with primary care or [place] and primary care can manage lo- loestrin since [place] no longer has a uterus .  Called in refill to [facility] and called Brylie and left a message we sent in a refill. Please schedule yearly exam with pcp or [place] and call us if questions.

## 2014-07-07 DIAGNOSIS — M9901 Segmental and somatic dysfunction of cervical region: Secondary | ICD-10-CM | POA: Diagnosis not present

## 2014-07-07 DIAGNOSIS — M531 Cervicobrachial syndrome: Secondary | ICD-10-CM | POA: Diagnosis not present

## 2014-07-07 DIAGNOSIS — R202 Paresthesia of skin: Secondary | ICD-10-CM | POA: Diagnosis not present

## 2014-07-07 DIAGNOSIS — M5033 Other cervical disc degeneration, cervicothoracic region: Secondary | ICD-10-CM | POA: Diagnosis not present

## 2014-07-19 ENCOUNTER — Encounter (HOSPITAL_COMMUNITY): Payer: Self-pay | Admitting: Emergency Medicine

## 2014-08-06 DIAGNOSIS — M9901 Segmental and somatic dysfunction of cervical region: Secondary | ICD-10-CM | POA: Diagnosis not present

## 2014-08-06 DIAGNOSIS — M5033 Other cervical disc degeneration, cervicothoracic region: Secondary | ICD-10-CM | POA: Diagnosis not present

## 2014-08-06 DIAGNOSIS — M531 Cervicobrachial syndrome: Secondary | ICD-10-CM | POA: Diagnosis not present

## 2014-08-06 DIAGNOSIS — R202 Paresthesia of skin: Secondary | ICD-10-CM | POA: Diagnosis not present

## 2014-08-16 DIAGNOSIS — M531 Cervicobrachial syndrome: Secondary | ICD-10-CM | POA: Diagnosis not present

## 2014-08-16 DIAGNOSIS — M9901 Segmental and somatic dysfunction of cervical region: Secondary | ICD-10-CM | POA: Diagnosis not present

## 2014-08-16 DIAGNOSIS — R202 Paresthesia of skin: Secondary | ICD-10-CM | POA: Diagnosis not present

## 2014-08-16 DIAGNOSIS — M5033 Other cervical disc degeneration, cervicothoracic region: Secondary | ICD-10-CM | POA: Diagnosis not present

## 2014-08-25 DIAGNOSIS — R202 Paresthesia of skin: Secondary | ICD-10-CM | POA: Diagnosis not present

## 2014-08-25 DIAGNOSIS — M531 Cervicobrachial syndrome: Secondary | ICD-10-CM | POA: Diagnosis not present

## 2014-08-25 DIAGNOSIS — M5033 Other cervical disc degeneration, cervicothoracic region: Secondary | ICD-10-CM | POA: Diagnosis not present

## 2014-08-25 DIAGNOSIS — M9901 Segmental and somatic dysfunction of cervical region: Secondary | ICD-10-CM | POA: Diagnosis not present

## 2014-08-26 DIAGNOSIS — M9901 Segmental and somatic dysfunction of cervical region: Secondary | ICD-10-CM | POA: Diagnosis not present

## 2014-08-26 DIAGNOSIS — M5033 Other cervical disc degeneration, cervicothoracic region: Secondary | ICD-10-CM | POA: Diagnosis not present

## 2014-08-26 DIAGNOSIS — M531 Cervicobrachial syndrome: Secondary | ICD-10-CM | POA: Diagnosis not present

## 2014-08-26 DIAGNOSIS — R202 Paresthesia of skin: Secondary | ICD-10-CM | POA: Diagnosis not present

## 2014-08-31 DIAGNOSIS — M5033 Other cervical disc degeneration, cervicothoracic region: Secondary | ICD-10-CM | POA: Diagnosis not present

## 2014-08-31 DIAGNOSIS — M9901 Segmental and somatic dysfunction of cervical region: Secondary | ICD-10-CM | POA: Diagnosis not present

## 2014-08-31 DIAGNOSIS — M531 Cervicobrachial syndrome: Secondary | ICD-10-CM | POA: Diagnosis not present

## 2014-08-31 DIAGNOSIS — R202 Paresthesia of skin: Secondary | ICD-10-CM | POA: Diagnosis not present

## 2014-09-07 DIAGNOSIS — M5033 Other cervical disc degeneration, cervicothoracic region: Secondary | ICD-10-CM | POA: Diagnosis not present

## 2014-09-07 DIAGNOSIS — R202 Paresthesia of skin: Secondary | ICD-10-CM | POA: Diagnosis not present

## 2014-09-07 DIAGNOSIS — M531 Cervicobrachial syndrome: Secondary | ICD-10-CM | POA: Diagnosis not present

## 2014-09-07 DIAGNOSIS — M9901 Segmental and somatic dysfunction of cervical region: Secondary | ICD-10-CM | POA: Diagnosis not present

## 2014-09-08 DIAGNOSIS — M531 Cervicobrachial syndrome: Secondary | ICD-10-CM | POA: Diagnosis not present

## 2014-09-08 DIAGNOSIS — M5033 Other cervical disc degeneration, cervicothoracic region: Secondary | ICD-10-CM | POA: Diagnosis not present

## 2014-09-08 DIAGNOSIS — M9901 Segmental and somatic dysfunction of cervical region: Secondary | ICD-10-CM | POA: Diagnosis not present

## 2014-09-08 DIAGNOSIS — R202 Paresthesia of skin: Secondary | ICD-10-CM | POA: Diagnosis not present

## 2014-09-16 ENCOUNTER — Encounter (HOSPITAL_COMMUNITY): Payer: Self-pay | Admitting: Emergency Medicine

## 2014-09-16 ENCOUNTER — Emergency Department (INDEPENDENT_AMBULATORY_CARE_PROVIDER_SITE_OTHER)
Admission: EM | Admit: 2014-09-16 | Discharge: 2014-09-16 | Disposition: A | Discharge status: Home / Self Care | Payer: Medicare Other | Source: Home / Self Care | Attending: Emergency Medicine | Admitting: Emergency Medicine

## 2014-09-16 DIAGNOSIS — J019 Acute sinusitis, unspecified: Secondary | ICD-10-CM

## 2014-09-16 DIAGNOSIS — M5033 Other cervical disc degeneration, cervicothoracic region: Secondary | ICD-10-CM | POA: Diagnosis not present

## 2014-09-16 DIAGNOSIS — J209 Acute bronchitis, unspecified: Secondary | ICD-10-CM

## 2014-09-16 DIAGNOSIS — M9901 Segmental and somatic dysfunction of cervical region: Secondary | ICD-10-CM | POA: Diagnosis not present

## 2014-09-16 DIAGNOSIS — R202 Paresthesia of skin: Secondary | ICD-10-CM | POA: Diagnosis not present

## 2014-09-16 DIAGNOSIS — M531 Cervicobrachial syndrome: Secondary | ICD-10-CM | POA: Diagnosis not present

## 2014-09-16 MED ORDER — AZITHROMYCIN 250 MG PO TABS: ORAL_TABLET | ORAL | Status: DC

## 2014-09-16 MED ORDER — TRAMADOL HCL 50 MG PO TABS: ORAL_TABLET | ORAL | Status: DC

## 2014-09-16 MED ORDER — HYDROCODONE-ACETAMINOPHEN 5-325 MG PO TABS
ORAL_TABLET | ORAL | Status: DC
Start: 2014-09-16 — End: 2014-11-11

## 2014-09-16 NOTE — Discharge Instructions (Signed)
Acute Bronchitis Bronchitis is inflammation of the airways that extend from the windpipe into the lungs (bronchi). The inflammation often causes mucus to develop. This leads to a cough, which is the most common symptom of bronchitis.  In acute bronchitis, the condition usually develops suddenly and goes away over time, usually in a couple weeks. Smoking, allergies, and asthma can make bronchitis worse. Repeated episodes of bronchitis may cause further lung problems.  CAUSES Acute bronchitis is most often caused by the same virus that causes a cold. The virus can spread from person to person (contagious) through coughing, sneezing, and touching contaminated objects. SIGNS AND SYMPTOMS   Cough.   Fever.   Coughing up mucus.   Body aches.   Chest congestion.   Chills.   Shortness of breath.   Sore throat.  DIAGNOSIS  Acute bronchitis is usually diagnosed through a physical exam. Your health care provider will also ask you questions about your medical history. Tests, such as chest X-rays, are sometimes done to rule out other conditions.  TREATMENT  Acute bronchitis usually goes away in a couple weeks. Oftentimes, no medical treatment is necessary. Medicines are sometimes given for relief of fever or cough. Antibiotic medicines are usually not needed but may be prescribed in certain situations. In some cases, an inhaler may be recommended to help reduce shortness of breath and control the cough. A cool mist vaporizer may also be used to help thin bronchial secretions and make it easier to clear the chest.  HOME CARE INSTRUCTIONS  Get plenty of rest.   Drink enough fluids to keep your urine clear or pale yellow (unless you have a medical condition that requires fluid restriction). Increasing fluids may help thin your respiratory secretions (sputum) and reduce chest congestion, and it will prevent dehydration.   Take medicines only as directed by your health care provider.  If  you were prescribed an antibiotic medicine, finish it all even if you start to feel better.  Avoid smoking and secondhand smoke. Exposure to cigarette smoke or irritating chemicals will make bronchitis worse. If you are a smoker, consider using nicotine gum or skin patches to help control withdrawal symptoms. Quitting smoking will help your lungs heal faster.   Reduce the chances of another bout of acute bronchitis by washing your hands frequently, avoiding people with cold symptoms, and trying not to touch your hands to your mouth, nose, or eyes.   Keep all follow-up visits as directed by your health care provider.  SEEK MEDICAL CARE IF: Your symptoms do not improve after 1 week of treatment.  SEEK IMMEDIATE MEDICAL CARE IF:  You develop an increased fever or chills.   You have chest pain.   You have severe shortness of breath.  You have bloody sputum.   You develop dehydration.  You faint or repeatedly feel like you are going to pass out.  You develop repeated vomiting.  You develop a severe headache. MAKE SURE YOU:   Understand these instructions.  Will watch your condition.  Will get help right away if you are not doing well or get worse. Document Released: 10/11/2004 Document Revised: 01/18/2014 Document Reviewed: 02/24/2013 ExitCare Patient Information 2015 [employer]. This information is not intended to replace advice given to you by your health care provider. Make sure you discuss any questions you have with your health care provider. Sinusitis Sinusitis is redness, soreness, and inflammation of the paranasal sinuses. Paranasal sinuses are air pockets within the bones of your face (beneath the   eyes, the middle of the forehead, or above the eyes). In healthy paranasal sinuses, mucus is able to drain out, and air is able to circulate through them by way of your nose. However, when your paranasal sinuses are inflamed, mucus and air can become trapped. This can  allow bacteria and other germs to grow and cause infection. Sinusitis can develop quickly and last only a short time (acute) or continue over a long period (chronic). Sinusitis that lasts for more than 12 weeks is considered chronic.  CAUSES  Causes of sinusitis include:  Allergies.  Structural abnormalities, such as displacement of the cartilage that separates your nostrils (deviated septum), which can decrease the air flow through your nose and sinuses and affect sinus drainage.  Functional abnormalities, such as when the small hairs (cilia) that line your sinuses and help remove mucus do not work properly or are not present. SIGNS AND SYMPTOMS  Symptoms of acute and chronic sinusitis are the same. The primary symptoms are pain and pressure around the affected sinuses. Other symptoms include:  Upper toothache.  Earache.  Headache.  Bad breath.  Decreased sense of smell and taste.  A cough, which worsens when you are lying flat.  Fatigue.  Fever.  Thick drainage from your nose, which often is green and may contain pus (purulent).  Swelling and warmth over the affected sinuses. DIAGNOSIS  Your health care provider will perform a physical exam. During the exam, your health care provider may:  Look in your nose for signs of abnormal growths in your nostrils (nasal polyps).  Tap over the affected sinus to check for signs of infection.  View the inside of your sinuses (endoscopy) using an imaging device that has a light attached (endoscope). If your health care provider suspects that you have chronic sinusitis, one or more of the following tests may be recommended:  Allergy tests.  Nasal culture. A sample of mucus is taken from your nose, sent to a lab, and screened for bacteria.  Nasal cytology. A sample of mucus is taken from your nose and examined by your health care provider to determine if your sinusitis is related to an allergy. TREATMENT  Most cases of acute  sinusitis are related to a viral infection and will resolve on their own within 10 days. Sometimes medicines are prescribed to help relieve symptoms (pain medicine, decongestants, nasal steroid sprays, or saline sprays).  However, for sinusitis related to a bacterial infection, your health care provider will prescribe antibiotic medicines. These are medicines that will help kill the bacteria causing the infection.  Rarely, sinusitis is caused by a fungal infection. In theses cases, your health care provider will prescribe antifungal medicine. For some cases of chronic sinusitis, surgery is needed. Generally, these are cases in which sinusitis recurs more than 3 times per year, despite other treatments. HOME CARE INSTRUCTIONS   Drink plenty of water. Water helps thin the mucus so your sinuses can drain more easily.  Use a humidifier.  Inhale steam 3 to 4 times a day (for example, sit in the bathroom with the shower running).  Apply a warm, moist washcloth to your face 3 to 4 times a day, or as directed by your health care provider.  Use saline nasal sprays to help moisten and clean your sinuses.  Take medicines only as directed by your health care provider.  If you were prescribed either an antibiotic or antifungal medicine, finish it all even if you start to feel better. SEEK IMMEDIATE MEDICAL   CARE IF:  You have increasing pain or severe headaches.  You have nausea, vomiting, or drowsiness.  You have swelling around your face.  You have vision problems.  You have a stiff neck.  You have difficulty breathing. MAKE SURE YOU:   Understand these instructions.  Will watch your condition.  Will get help right away if you are not doing well or get worse. Document Released: 09/03/2005 Document Revised: 01/18/2014 Document Reviewed: 09/18/2011 ExitCare Patient Information 2015 [employer]. This information is not intended to replace advice given to you by your health care provider.  Make sure you discuss any questions you have with your health care provider.  

## 2014-09-16 NOTE — ED Provider Notes (Signed)
   Chief Complaint   URI and Otalgia   History of Present Illness   Hero P Paternostro is a 43 year old female who is hearing impaired and history is obtained with the help of a [place] interpreter. She presents with a one-week history of cough productive yellow to green to brown sputum, chest tightness, and wheezing. She has a history of asthma and is on albuterol and Flovent. She also complains of pain in her right ear, temperature at home to 101.5, postnasal drip, sore throat, hoarseness, and nausea. She denies any vomiting or diarrhea. No sore throat.  Review of Systems   Other than as noted above, the patient denies any of the following symptoms: Systemic:  No fevers, chills, sweats, or myalgias. Eye:  No redness or discharge. ENT:  No ear pain, headache, nasal congestion, drainage, sinus pressure, or sore throat. Neck:  No neck pain, stiffness, or swollen glands. Lungs:  No cough, sputum production, hemoptysis, wheezing, chest tightness, shortness of breath or chest pain. GI:  No abdominal pain, nausea, vomiting or diarrhea.  [place]   Past medical history, family history, social history, meds, and allergies were reviewed.   Physical exam   Vital signs:  BP 145/91 mmHg  Pulse 105  Temp(Src) 98.7 F (37.1 C) (Oral)  Resp 22  SpO2 97%  LMP 12/26/2004 General:  Alert and oriented.  In no distress.  Skin warm and dry. She is unable to speak above a whisper. Eye:  No conjunctival injection or drainage. Lids were normal. ENT:  TMs and canals were normal, without erythema or inflammation.  Nasal mucosa was clear and uncongested, without drainage.  Mucous membranes were moist.  Pharynx was clear with no exudate or drainage.  There were no oral ulcerations or lesions. Neck:  Supple, no adenopathy, tenderness or mass. Lungs:  No respiratory distress.  Lungs were clear to auscultation, without wheezes, rales or rhonchi.  Breath sounds were clear and equal bilaterally.  Heart:  Regular  rhythm, without gallops, murmers or rubs. Skin:  Clear, warm, and dry, without rash or lesions.  Assessment     The primary encounter diagnosis was Acute sinusitis, recurrence not specified, unspecified location. A diagnosis of Acute bronchitis, unspecified organism was also pertinent to this visit.  Plan    1.  Meds:  The following meds were prescribed:   Discharge Medication List as of 09/16/2014  2:38 PM    START taking these medications   Details  azithromycin (ZITHROMAX Z-PAK) 250 MG tablet Take as directed., Normal    HYDROcodone-acetaminophen (NORCO/VICODIN) 5-325 MG per tablet 1 to 2 tabs every 4 to 6 hours as needed for pain., Print    traMADol (ULTRAM) 50 MG tablet 1 to 2 tablets every 8 hours as needed for cough, Print        2.  Patient Education/Counseling:  The patient was given appropriate handouts, self care instructions, and instructed in symptomatic relief.  Instructed to get extra fluids and extra rest.    3.  Follow up:  The patient was told to follow up here if no better in 3 to 4 days, or sooner if becoming worse in any way, and given some red flag symptoms such as increasing fever, difficulty breathing, chest pain, or persistent vomiting which would prompt immediate return.       Harden Mo, MD 09/16/14 [phone]

## 2014-09-16 NOTE — ED Notes (Signed)
C/o  Productive deep cough with yellow sputum.  Chest tightness.  Denies sob.   Low grade fever.   Pain in right ear.  Fullness in left ear.  Body aches.   Nausea.  Denies vomiting and diarrhea.   Mild relief in symptoms with otc medications.     Symptoms present x 1 wk.

## 2014-09-21 DIAGNOSIS — M9901 Segmental and somatic dysfunction of cervical region: Secondary | ICD-10-CM | POA: Diagnosis not present

## 2014-09-21 DIAGNOSIS — M5033 Other cervical disc degeneration, cervicothoracic region: Secondary | ICD-10-CM | POA: Diagnosis not present

## 2014-09-21 DIAGNOSIS — R202 Paresthesia of skin: Secondary | ICD-10-CM | POA: Diagnosis not present

## 2014-09-21 DIAGNOSIS — M531 Cervicobrachial syndrome: Secondary | ICD-10-CM | POA: Diagnosis not present

## 2014-09-24 DIAGNOSIS — M5033 Other cervical disc degeneration, cervicothoracic region: Secondary | ICD-10-CM | POA: Diagnosis not present

## 2014-09-24 DIAGNOSIS — M9901 Segmental and somatic dysfunction of cervical region: Secondary | ICD-10-CM | POA: Diagnosis not present

## 2014-09-24 DIAGNOSIS — R202 Paresthesia of skin: Secondary | ICD-10-CM | POA: Diagnosis not present

## 2014-09-24 DIAGNOSIS — M531 Cervicobrachial syndrome: Secondary | ICD-10-CM | POA: Diagnosis not present

## 2014-10-02 ENCOUNTER — Emergency Department (INDEPENDENT_AMBULATORY_CARE_PROVIDER_SITE_OTHER)
Admission: EM | Admit: 2014-10-02 | Discharge: 2014-10-02 | Disposition: A | Discharge status: Home / Self Care | Payer: Medicare Other | Source: Home / Self Care | Attending: Emergency Medicine | Admitting: Emergency Medicine

## 2014-10-02 ENCOUNTER — Encounter (HOSPITAL_COMMUNITY): Payer: Self-pay | Admitting: Emergency Medicine

## 2014-10-02 DIAGNOSIS — J329 Chronic sinusitis, unspecified: Secondary | ICD-10-CM

## 2014-10-02 MED ORDER — CLINDAMYCIN HCL 300 MG PO CAPS: 300.0000 mg | ORAL_CAPSULE | Freq: Four times a day (QID) | ORAL | Status: DC

## 2014-10-02 MED ORDER — HYDROCODONE-ACETAMINOPHEN 5-325 MG PO TABS: ORAL_TABLET | ORAL | Status: DC

## 2014-10-02 NOTE — ED Provider Notes (Signed)
Chief Complaint   URI   History of Present Illness   Donna Davis is a 44 year old hearing impaired female who was here in December for the same symptoms. She states that been going on since Thanksgiving. She is here today with a sign language interpreter. She finished 2 [employer] in December. She continues to have nasal congestion, headache, sinus pressure, and pain behind her right ear. She's also had dry cough, intermittent subjective fevers, and sore throat.  Review of Systems   Other than as noted above, the patient denies any of the following symptoms: Systemic:  No fevers, chills, sweats, or myalgias. Eye:  No redness or discharge. ENT:  No ear pain, headache, nasal congestion, drainage, sinus pressure, or sore throat. Neck:  No neck pain, stiffness, or swollen glands. Lungs:  No cough, sputum production, hemoptysis, wheezing, chest tightness, shortness of breath or chest pain. GI:  No abdominal pain, nausea, vomiting or diarrhea.  [place]   Past medical history, family history, social history, meds, and allergies were reviewed. She has many drug allergies and sensitivities. They are listed in her chart. Current meds include estrogen, Synthroid, and Xanax.  Physical exam   Vital signs:  BP 141/78 mmHg  Pulse 91  Temp(Src) 99 F (37.2 C) (Oral)  Resp 20  SpO2 96%  LMP 12/26/2004 General:  Alert and oriented.  In no distress.  Skin warm and dry. Eye:  No conjunctival injection or drainage. Lids were normal. ENT:  TMs and canals were normal, without erythema or inflammation.  Nasal mucosa was clear and uncongested, without drainage.  Mucous membranes were moist.  Pharynx was clear with no exudate or drainage.  There were no oral ulcerations or lesions. There was no swelling or erythema in the right mastoid area where the patient states she's tender. There was mild tenderness to palpation. Neck:  Supple, no adenopathy, tenderness or mass. Lungs:  No respiratory distress.  Lungs  were clear to auscultation, without wheezes, rales or rhonchi.  Breath sounds were clear and equal bilaterally.  Heart:  Regular rhythm, without gallops, murmers or rubs. Skin:  Clear, warm, and dry, without rash or lesions.    Assessment     The encounter diagnosis was Sinusitis, unspecified chronicity, unspecified location.  Mastoiditis would be distinctly unlikely with a normal ear examination. Her canal and TM are completely normal. This may be all due to sinusitis. I have suggested clindamycin, since that's about the only antibiotic that she has not reacted to that we haven't tried yet. I will also suggested she follow-up with her ENT doctor.  Plan    1.  Meds:  The following meds were prescribed:   Discharge Medication List as of 10/02/2014  2:28 PM    START taking these medications   Details  clindamycin (CLEOCIN) 300 MG capsule Take 1 capsule (300 mg total) by mouth 4 (four) times daily., Starting 10/02/2014, Until Discontinued, Normal    !! HYDROcodone-acetaminophen (NORCO/VICODIN) 5-325 MG per tablet 1 to 2 tabs every 4 to 6 hours as needed for pain., Print     !! - Potential duplicate medications found. Please discuss with provider.      2.  Patient Education/Counseling:  The patient was given appropriate handouts, self care instructions, and instructed in symptomatic relief.  Instructed to get extra fluids and extra rest.    3.  Follow up:  The patient was told to follow up here if no better in 3 to 4 days, or sooner if becoming  worse in any way, and given some red flag symptoms such as increasing fever, difficulty breathing, chest pain, or persistent vomiting which would prompt immediate return.       Harden Mo, MD 10/02/14 [phone]

## 2014-10-02 NOTE — Discharge Instructions (Signed)

## 2014-10-10 ENCOUNTER — Encounter: Payer: Self-pay | Admitting: Internal Medicine

## 2014-10-11 ENCOUNTER — Other Ambulatory Visit: Payer: Self-pay | Admitting: *Deleted

## 2014-10-11 DIAGNOSIS — R202 Paresthesia of skin: Secondary | ICD-10-CM | POA: Diagnosis not present

## 2014-10-11 DIAGNOSIS — M9901 Segmental and somatic dysfunction of cervical region: Secondary | ICD-10-CM | POA: Diagnosis not present

## 2014-10-11 DIAGNOSIS — M5033 Other cervical disc degeneration, cervicothoracic region: Secondary | ICD-10-CM | POA: Diagnosis not present

## 2014-10-11 DIAGNOSIS — M531 Cervicobrachial syndrome: Secondary | ICD-10-CM | POA: Diagnosis not present

## 2014-10-11 MED ORDER — ALPRAZOLAM 0.25 MG PO TABS: 0.2500 mg | ORAL_TABLET | Freq: Every evening | ORAL | Status: DC | PRN

## 2014-10-12 DIAGNOSIS — J018 Other acute sinusitis: Secondary | ICD-10-CM | POA: Diagnosis not present

## 2014-10-13 NOTE — Telephone Encounter (Signed)
Called to pharm 

## 2014-10-22 DIAGNOSIS — M5033 Other cervical disc degeneration, cervicothoracic region: Secondary | ICD-10-CM | POA: Diagnosis not present

## 2014-10-22 DIAGNOSIS — R202 Paresthesia of skin: Secondary | ICD-10-CM | POA: Diagnosis not present

## 2014-10-22 DIAGNOSIS — M531 Cervicobrachial syndrome: Secondary | ICD-10-CM | POA: Diagnosis not present

## 2014-10-22 DIAGNOSIS — M9901 Segmental and somatic dysfunction of cervical region: Secondary | ICD-10-CM | POA: Diagnosis not present

## 2014-11-11 ENCOUNTER — Encounter (HOSPITAL_COMMUNITY): Payer: Self-pay | Admitting: Emergency Medicine

## 2014-11-11 ENCOUNTER — Emergency Department (HOSPITAL_COMMUNITY)
Admission: EM | Admit: 2014-11-11 | Discharge: 2014-11-11 | Disposition: A | Discharge status: Home / Self Care | Payer: Medicare Other

## 2014-11-11 ENCOUNTER — Emergency Department (HOSPITAL_COMMUNITY): Payer: Medicare Other

## 2014-11-11 ENCOUNTER — Emergency Department (HOSPITAL_COMMUNITY)
Admission: EM | Admit: 2014-11-11 | Discharge: 2014-11-11 | Disposition: A | Discharge status: Home / Self Care | Payer: Medicare Other | Attending: Emergency Medicine | Admitting: Emergency Medicine

## 2014-11-11 DIAGNOSIS — W001XXA Fall from stairs and steps due to ice and snow, initial encounter: Secondary | ICD-10-CM | POA: Diagnosis not present

## 2014-11-11 DIAGNOSIS — Y9389 Activity, other specified: Secondary | ICD-10-CM | POA: Insufficient documentation

## 2014-11-11 DIAGNOSIS — Z7951 Long term (current) use of inhaled steroids: Secondary | ICD-10-CM | POA: Diagnosis not present

## 2014-11-11 DIAGNOSIS — Z793 Long term (current) use of hormonal contraceptives: Secondary | ICD-10-CM | POA: Insufficient documentation

## 2014-11-11 DIAGNOSIS — S29002A Unspecified injury of muscle and tendon of back wall of thorax, initial encounter: Secondary | ICD-10-CM | POA: Diagnosis not present

## 2014-11-11 DIAGNOSIS — F419 Anxiety disorder, unspecified: Secondary | ICD-10-CM | POA: Insufficient documentation

## 2014-11-11 DIAGNOSIS — M549 Dorsalgia, unspecified: Secondary | ICD-10-CM | POA: Diagnosis not present

## 2014-11-11 DIAGNOSIS — Z8619 Personal history of other infectious and parasitic diseases: Secondary | ICD-10-CM | POA: Diagnosis not present

## 2014-11-11 DIAGNOSIS — Z87891 Personal history of nicotine dependence: Secondary | ICD-10-CM | POA: Diagnosis not present

## 2014-11-11 DIAGNOSIS — Z8719 Personal history of other diseases of the digestive system: Secondary | ICD-10-CM | POA: Insufficient documentation

## 2014-11-11 DIAGNOSIS — S299XXA Unspecified injury of thorax, initial encounter: Secondary | ICD-10-CM | POA: Diagnosis not present

## 2014-11-11 DIAGNOSIS — S20211A Contusion of right front wall of thorax, initial encounter: Secondary | ICD-10-CM | POA: Diagnosis not present

## 2014-11-11 DIAGNOSIS — Z880 Allergy status to penicillin: Secondary | ICD-10-CM | POA: Insufficient documentation

## 2014-11-11 DIAGNOSIS — Z79899 Other long term (current) drug therapy: Secondary | ICD-10-CM | POA: Diagnosis not present

## 2014-11-11 DIAGNOSIS — M546 Pain in thoracic spine: Secondary | ICD-10-CM

## 2014-11-11 DIAGNOSIS — Z792 Long term (current) use of antibiotics: Secondary | ICD-10-CM | POA: Diagnosis not present

## 2014-11-11 DIAGNOSIS — Z8669 Personal history of other diseases of the nervous system and sense organs: Secondary | ICD-10-CM | POA: Diagnosis not present

## 2014-11-11 DIAGNOSIS — Y9289 Other specified places as the place of occurrence of the external cause: Secondary | ICD-10-CM | POA: Diagnosis not present

## 2014-11-11 DIAGNOSIS — S29001A Unspecified injury of muscle and tendon of front wall of thorax, initial encounter: Secondary | ICD-10-CM | POA: Diagnosis present

## 2014-11-11 DIAGNOSIS — Y998 Other external cause status: Secondary | ICD-10-CM | POA: Insufficient documentation

## 2014-11-11 DIAGNOSIS — E039 Hypothyroidism, unspecified: Secondary | ICD-10-CM | POA: Diagnosis not present

## 2014-11-11 MED ORDER — NAPROXEN 500 MG PO TABS
500.0000 mg | ORAL_TABLET | Freq: Two times a day (BID) | ORAL | Status: DC
Start: 2014-11-11 — End: 2015-02-09

## 2014-11-11 MED ORDER — METHOCARBAMOL 500 MG PO TABS: 1000.0000 mg | ORAL_TABLET | Freq: Four times a day (QID) | ORAL | Status: DC

## 2014-11-11 MED ORDER — HYDROCODONE-ACETAMINOPHEN 5-325 MG PO TABS: ORAL_TABLET | ORAL | Status: DC

## 2014-11-11 NOTE — ED Notes (Addendum)
Pt st's she fell down her steps 2 weeks ago during the snow and ice.  Pt st's she has had continued pain in mid upper back radiating into ribs.  Pt is deaf but reads lips

## 2014-11-11 NOTE — Discharge Instructions (Signed)
Please read and follow all provided instructions.  Your diagnoses today include:  1. Rib contusion, right, initial encounter   2. Right-sided thoracic back pain     Tests performed today include:  Vital signs - see below for your results today  X-ray of ribs, middle back - no broken bones  Medications prescribed:   Vicodin (hydrocodone/acetaminophen) - narcotic pain medication  DO NOT drive or perform any activities that require you to be awake and alert because this medicine can make you drowsy. BE VERY CAREFUL not to take multiple medicines containing Tylenol (also called acetaminophen). Doing so can lead to an overdose which can damage your liver and cause liver failure and possibly death.   Robaxin (methocarbamol) - muscle relaxer medication  DO NOT drive or perform any activities that require you to be awake and alert because this medicine can make you drowsy.    Naproxen - anti-inflammatory pain medication  Do not exceed 500mg  naproxen every 12 hours, take with food  You have been prescribed an anti-inflammatory medication or NSAID. Take with food. Take smallest effective dose for the shortest duration needed for your pain. Stop taking if you experience stomach pain or vomiting.    Take any prescribed medications only as directed.  Home care instructions:   Follow any educational materials contained in this packet  Please rest, use ice or heat on your back for the next several days  Do not lift, push, pull anything more than 10 pounds for the next week  Follow-up instructions: Please follow-up with your primary care provider in the next 1 week for further evaluation of your symptoms.   Return instructions:  SEEK IMMEDIATE MEDICAL ATTENTION IF YOU HAVE:  New numbness, tingling, weakness, or problem with the use of your arms or legs  Severe back pain not relieved with medications  Loss control of your bowels or bladder  Increasing pain in any areas of the  body (such as chest or abdominal pain)  Shortness of breath, dizziness, or fainting.   Worsening nausea (feeling sick to your stomach), vomiting, fever, or sweats  Any other emergent concerns regarding your health   Additional Information:  Your vital signs today were: BP 138/91 mmHg   Pulse 56   Temp(Src) 98.5 F (36.9 C) (Oral)   Resp 16   SpO2 98%   LMP 12/26/2004 If your blood pressure (BP) was elevated above 135/85 this visit, please have this repeated by your doctor within one month. --------------

## 2014-11-11 NOTE — ED Notes (Signed)
No answer x2 

## 2014-11-11 NOTE — ED Notes (Signed)
Pt off unit with xray 

## 2014-11-11 NOTE — ED Notes (Signed)
No answer x 4  

## 2014-11-11 NOTE — ED Provider Notes (Signed)
CSN: [id number]     Arrival date & time 11/11/14  1720 History  This chart was scribed for non-physician practitioner, Carlisle Cater, working with Evelina Bucy, MD by Molli Posey, ED Scribe. This patient was seen in room TR08C/TR08C and the patient's care was started at 5:59 PM.    Chief Complaint  Patient presents with  . Back Pain   The history is provided by the patient. No language interpreter was used.   HPI Comments: Donna Davis is a 44 y.o. female with a history of deafness and shingles who presents to the Emergency Department complaining of lower to mid back pain that started 2 weeks ago. Pt states that she slipped and fell down 5 stairs from snow 2 weeks ago. She states she injured her right shoulder, right hip and lower to mid back pain during the fall. Pt reports her shoulder and hip pain resolved with rest and ibuprofen but her mid back pain has not improved. She states that she has been taking ibuprofen which has failed to relieve her back pain. Pt reports that certain positions and sneezing worsens her pain. She reports no alleviating factors at this time. No red flag s/s of back pain.   Past Medical History  Diagnosis Date  . GERD (gastroesophageal reflux disease)   . Hypothyroidism   . IBS (irritable bowel syndrome)   . Anxiety   . Hearing difficulty     b/l, 2/2 congenital rubella s/p stapedectomy. Using hearing aid  . Headache(784.0) 04/2005    h/o headache in past that raised question of psuedotumor s/p therapeutic LP with an opening preassure of 18 cm H2O   . Hepatic cyst     6 mm on CT done done in 05/2005  . Family history of malignant neoplasm of gastrointestinal tract   . Shingles 03/2013   Past Surgical History  Procedure Laterality Date  . Abdominal hysterectomy      partial hysterecotmy 2003 for endoemtriosis with residula right ovaey. Multiple GYN surgeries for chronic pelvic pain  . Cesarean section  1994  . Stapedectomy  2007    Dr. Lucia Gaskins, left ear   . Laparoscopic ovarian cystectomy  2001  . Thyroid nodule removed  1997  . Total abdominal hysterectomy w/ bilateral salpingoophorectomy      left  . Abdominal adhesion surgery      removal from bowel  . Joint replacement    . Cholecystectomy N/A 06/26/2013    Procedure: LAPAROSCOPIC CHOLECYSTECTOMY WITH ATTEMPTED INTRAOPERATIVE CHOLANGIOGRAM;  Surgeon: Harl Bowie, MD;  Location: [facility];  Service: General;  Laterality: N/A;   Family History  Problem Relation Age of Onset  . Breast cancer Mother   . Ovarian cancer Mother   . Diabetes Mother   . Stomach cancer Maternal Grandmother   . Colon cancer Maternal Grandmother   . Diabetes Maternal Grandmother   . Pancreatic cancer Father   . Prostate cancer Father   . Colon cancer Father   . Diabetes Father   . Heart disease Father   . Irritable bowel syndrome Father   . Kidney disease Father   . Heart disease      both grandmothers  . Bipolar disorder Daughter    History  Substance Use Topics  . Smoking status: Former Smoker    Types: Cigarettes    Quit date: 02/18/2008  . Smokeless tobacco: Never Used     Comment: quit 64yrs ago  . Alcohol Use: No   OB History  Gravida Para Term Preterm AB TAB SAB Ectopic Multiple Living   3 2 2  1 1    2      Review of Systems  Constitutional: Negative for fever and unexpected weight change.  Gastrointestinal: Negative for constipation.       Negative for fecal incontinence.   Genitourinary: Negative for dysuria, hematuria, flank pain, vaginal bleeding, vaginal discharge and pelvic pain.       Negative for urinary incontinence or retention.  Musculoskeletal: Positive for myalgias and back pain.  Neurological: Negative for weakness and numbness.       Denies saddle paresthesias.   Allergies  Onion; Shellfish allergy; Amitriptyline; Amoxicillin-pot clavulanate; Cefuroxime; Cephalexin; Ciprofloxacin; Clarithromycin; Doxycycline; Prednisone; Propranolol; Shrimp flavor; Sulfonamide  derivatives; and Benadryl  Home Medications   Prior to Admission medications   Medication Sig Start Date End Date Taking? Authorizing Provider  albuterol (PROVENTIL HFA;VENTOLIN HFA) 108 (90 BASE) MCG/ACT inhaler Inhale into the lungs every 6 (six) hours as needed for wheezing or shortness of breath.    Historical Provider, MD  ALPRAZolam Duanne Moron) 0.25 MG tablet Take 1 tablet (0.25 mg total) by mouth at bedtime as needed for anxiety. 10/11/14   Sid Falcon, MD  Ascorbic Acid (VITAMIN C ER PO) Take by mouth.    Historical Provider, MD  azithromycin (ZITHROMAX Z-PAK) 250 MG tablet Take as directed. 09/16/14   Harden Mo, MD  clindamycin (CLEOCIN) 300 MG capsule Take 1 capsule (300 mg total) by mouth 4 (four) times daily. 10/02/14   Harden Mo, MD  fluticasone (FLOVENT HFA) 110 MCG/ACT inhaler Inhale into the lungs 2 (two) times daily.    Historical Provider, MD  HYDROcodone-acetaminophen (NORCO/VICODIN) 5-325 MG per tablet 1 to 2 tabs every 4 to 6 hours as needed for pain. 09/16/14   Harden Mo, MD  HYDROcodone-acetaminophen (NORCO/VICODIN) 5-325 MG per tablet 1 to 2 tabs every 4 to 6 hours as needed for pain. 10/02/14   Harden Mo, MD  levothyroxine (SYNTHROID, LEVOTHROID) 25 MCG tablet Take 25 mcg by mouth daily before breakfast.    Historical Provider, MD  Norethindrone-Ethinyl Estradiol-Fe Biphas (LO LOESTRIN FE) 1 MG-10 MCG / 10 MCG tablet Take 1 tablet by mouth daily. 06/28/14   Osborne Oman, MD  SUMAtriptan (IMITREX) 100 MG tablet Take 100 mg by mouth every 2 (two) hours as needed for migraine or headache. May repeat in 2 hours if headache persists or recurs.    Historical Provider, MD  traMADol (ULTRAM) 50 MG tablet 1 to 2 tablets every 8 hours as needed for cough 09/16/14   Harden Mo, MD   BP 138/91 mmHg  Pulse 56  Temp(Src) 98.5 F (36.9 C) (Oral)  Resp 16  SpO2 98%  LMP 12/26/2004 Physical Exam  Constitutional: She is oriented to person, place, and time.  She appears well-developed and well-nourished.  HENT:  Head: Normocephalic and atraumatic.  Eyes: Conjunctivae are normal. Right eye exhibits no discharge. Left eye exhibits no discharge.  Neck: Normal range of motion. Neck supple. No tracheal deviation present.  Cardiovascular: Normal rate.   Pulmonary/Chest: Effort normal. No respiratory distress.  Abdominal: Soft. She exhibits no distension. There is no tenderness. There is no CVA tenderness.  Musculoskeletal: Normal range of motion.       Cervical back: Normal.       Thoracic back: She exhibits tenderness and bony tenderness. She exhibits normal range of motion, no swelling and no edema.  Lumbar back: Normal.       Back:  No step-off noted with palpation of spine.   Neurological: She is alert and oriented to person, place, and time. She has normal strength and normal reflexes. No sensory deficit.  5/5 strength in entire lower extremities bilaterally. No sensation deficit.   Skin: Skin is warm and dry. No rash noted.  Psychiatric: She has a normal mood and affect. Her behavior is normal.  Nursing note and vitals reviewed.   ED Course  Procedures  DIAGNOSTIC STUDIES: Oxygen Saturation is 98% on RA, normal by my interpretation.    COORDINATION OF CARE: 6:03 PM Discussed treatment plan with pt at bedside and pt agreed to plan.   Labs Review Labs Reviewed - No data to display  Imaging Review Dg Ribs Unilateral W/chest Right  11/11/2014   CLINICAL DATA:  44 year old female who [place]. Continued thoracic back pain radiating to the posterior ribs. Initial encounter.  EXAM: RIGHT RIBS AND CHEST - 3+ VIEW  COMPARISON:  Chest radiographs 07/20/2013.  FINDINGS: Stable lung volumes. Stable cardiac size and mediastinal contours. Visualized tracheal air column is within normal limits. No pneumothorax or pleural effusion identified. No confluent pulmonary opacity.  Bone mineralization is within normal  limits. Oblique views of the right ribs. Rib marker placed at the posterior right eleventh rib level. Lower rib detail is mildly degraded by body habitus. No displaced right rib fracture identified. Stable cholecystectomy clips. Stable Mild thoracic scoliosis. No acute osseous abnormality identified.  IMPRESSION: No displaced right rib fracture identified. No acute cardiopulmonary abnormality.   Electronically Signed   By: Genevie Ann M.D.   On: 11/11/2014 19:09   Dg Thoracic Spine 2 View  11/11/2014   CLINICAL DATA:  Fall 2 weeks ago with persistent back pain, initial encounter  EXAM: THORACIC SPINE - 2 VIEW  COMPARISON:  None.  FINDINGS: There is no evidence of thoracic spine fracture. Alignment is normal. No other significant bone abnormalities are identified.  IMPRESSION: No acute abnormality noted.   Electronically Signed   By: Inez Catalina M.D.   On: 11/11/2014 19:05     EKG Interpretation None       Patient seen and examined. Work-up initiated.   Vital signs reviewed and are as follows: Filed Vitals:   11/11/14 1731  BP: 138/91  Pulse: 56  Temp: 98.5 F (36.9 C)  Resp: 16   No red flag s/s of low back pain. Pt informed of x-ray results. Patient was counseled on back pain precautions and told to do activity as tolerated but do not lift, push, or pull heavy objects more than 10 pounds for the next week.  Patient counseled to use ice or heat on back for no longer than 15 minutes every hour.   Patient prescribed muscle relaxer and counseled on proper use of muscle relaxant medication.    Patient prescribed narcotic pain medicine and counseled on proper use of narcotic pain medications. Counseled not to combine this medication with others containing tylenol.   Urged patient not to drink alcohol, drive, or perform any other activities that requires focus while taking either of these medications.  Patient urged to follow-up with PCP if pain does not improve with treatment and rest or if  pain becomes recurrent. Urged to return with worsening severe pain, loss of bowel or bladder control, trouble walking.   The patient verbalizes understanding and agrees with the plan.  MDM   Final diagnoses:  Rib contusion, right, initial encounter  Right-sided thoracic back pain   Patient with back pain and posterior rib pain after fall 2 weeks ago. Imaging negative. No neurological deficits. Patient is ambulatory. No warning symptoms of back pain including: fecal incontinence, urinary retention or overflow incontinence, night sweats, waking from sleep with back pain, unexplained fevers or weight loss, h/o cancer, IVDU, recent trauma. No concern for cauda equina, epidural abscess, or other serious cause of back pain. Conservative measures such as rest, ice/heat and pain medicine indicated with PCP follow-up if no improvement with conservative management.    I personally performed the services described in this documentation, which was scribed in my presence. The recorded information has been reviewed and is accurate.      Carlisle Cater, PA-C 11/11/14 1920  Evelina Bucy, MD 11/11/14 [phone]

## 2014-11-26 DIAGNOSIS — R202 Paresthesia of skin: Secondary | ICD-10-CM | POA: Diagnosis not present

## 2014-11-26 DIAGNOSIS — M5033 Other cervical disc degeneration, cervicothoracic region: Secondary | ICD-10-CM | POA: Diagnosis not present

## 2014-11-26 DIAGNOSIS — M9901 Segmental and somatic dysfunction of cervical region: Secondary | ICD-10-CM | POA: Diagnosis not present

## 2014-11-26 DIAGNOSIS — M531 Cervicobrachial syndrome: Secondary | ICD-10-CM | POA: Diagnosis not present

## 2014-12-31 DIAGNOSIS — R202 Paresthesia of skin: Secondary | ICD-10-CM | POA: Diagnosis not present

## 2014-12-31 DIAGNOSIS — M9901 Segmental and somatic dysfunction of cervical region: Secondary | ICD-10-CM | POA: Diagnosis not present

## 2014-12-31 DIAGNOSIS — M531 Cervicobrachial syndrome: Secondary | ICD-10-CM | POA: Diagnosis not present

## 2014-12-31 DIAGNOSIS — M5033 Other cervical disc degeneration, cervicothoracic region: Secondary | ICD-10-CM | POA: Diagnosis not present

## 2015-01-12 ENCOUNTER — Encounter (HOSPITAL_COMMUNITY): Payer: Self-pay

## 2015-01-12 ENCOUNTER — Emergency Department (INDEPENDENT_AMBULATORY_CARE_PROVIDER_SITE_OTHER)
Admission: EM | Admit: 2015-01-12 | Discharge: 2015-01-12 | Disposition: A | Discharge status: Home / Self Care | Payer: Medicare Other | Source: Home / Self Care | Attending: Family Medicine | Admitting: Family Medicine

## 2015-01-12 ENCOUNTER — Encounter: Payer: Self-pay | Admitting: Internal Medicine

## 2015-01-12 ENCOUNTER — Other Ambulatory Visit: Payer: Self-pay | Admitting: Internal Medicine

## 2015-01-12 DIAGNOSIS — J069 Acute upper respiratory infection, unspecified: Secondary | ICD-10-CM

## 2015-01-12 DIAGNOSIS — R05 Cough: Secondary | ICD-10-CM

## 2015-01-12 DIAGNOSIS — R059 Cough, unspecified: Secondary | ICD-10-CM

## 2015-01-12 MED ORDER — PREDNISONE 10 MG PO TABS: 20.0000 mg | ORAL_TABLET | Freq: Every day | ORAL | Status: DC

## 2015-01-12 MED ORDER — IPRATROPIUM BROMIDE 0.06 % NA SOLN: 2.0000 | Freq: Four times a day (QID) | NASAL | Status: DC

## 2015-01-12 MED ORDER — AZITHROMYCIN 250 MG PO TABS: ORAL_TABLET | ORAL | Status: DC

## 2015-01-12 MED ORDER — GUAIFENESIN-CODEINE 100-10 MG/5ML PO SOLN: 5.0000 mL | Freq: Three times a day (TID) | ORAL | Status: DC | PRN

## 2015-01-12 MED ORDER — BENZONATATE 100 MG PO CAPS: 100.0000 mg | ORAL_CAPSULE | Freq: Three times a day (TID) | ORAL | Status: DC | PRN

## 2015-01-12 NOTE — Discharge Instructions (Signed)
Upper Respiratory Infection, Adult An upper respiratory infection (URI) is also sometimes known as the common cold. The upper respiratory tract includes the nose, sinuses, throat, trachea, and bronchi. Bronchi are the airways leading to the lungs. Most people improve within 1 week, but symptoms can last up to 2 weeks. A residual cough may last even longer.  CAUSES Many different viruses can infect the tissues lining the upper respiratory tract. The tissues become irritated and inflamed and often become very moist. Mucus production is also common. A cold is contagious. You can easily spread the virus to others by oral contact. This includes kissing, sharing a glass, coughing, or sneezing. Touching your mouth or nose and then touching a surface, which is then touched by another person, can also spread the virus. SYMPTOMS  Symptoms typically develop 1 to 3 days after you come in contact with a cold virus. Symptoms vary from person to person. They may include:  Runny nose.  Sneezing.  Nasal congestion.  Sinus irritation.  Sore throat.  Loss of voice (laryngitis).  Cough.  Fatigue.  Muscle aches.  Loss of appetite.  Headache.  Low-grade fever. DIAGNOSIS  You might diagnose your own cold based on familiar symptoms, since most people get a cold 2 to 3 times a year. Your caregiver can confirm this based on your exam. Most importantly, your caregiver can check that your symptoms are not due to another disease such as strep throat, sinusitis, pneumonia, asthma, or epiglottitis. Blood tests, throat tests, and X-rays are not necessary to diagnose a common cold, but they may sometimes be helpful in excluding other more serious diseases. Your caregiver will decide if any further tests are required. RISKS AND COMPLICATIONS  You may be at risk for a more severe case of the common cold if you smoke cigarettes, have chronic heart disease (such as heart failure) or lung disease (such as asthma), or if  you have a weakened immune system. The very young and very old are also at risk for more serious infections. Bacterial sinusitis, middle ear infections, and bacterial pneumonia can complicate the common cold. The common cold can worsen asthma and chronic obstructive pulmonary disease (COPD). Sometimes, these complications can require emergency medical care and may be life-threatening. PREVENTION  The best way to protect against getting a cold is to practice good hygiene. Avoid oral or hand contact with people with cold symptoms. Wash your hands often if contact occurs. There is no clear evidence that vitamin C, vitamin E, echinacea, or exercise reduces the chance of developing a cold. However, it is always recommended to get plenty of rest and practice good nutrition. TREATMENT  Treatment is directed at relieving symptoms. There is no cure. Antibiotics are not effective, because the infection is caused by a virus, not by bacteria. Treatment may include:  Increased fluid intake. Sports drinks offer valuable electrolytes, sugars, and fluids.  Breathing heated mist or steam (vaporizer or shower).  Eating chicken soup or other clear broths, and maintaining good nutrition.  Getting plenty of rest.  Using gargles or lozenges for comfort.  Controlling fevers with ibuprofen or acetaminophen as directed by your caregiver.  Increasing usage of your inhaler if you have asthma. Zinc gel and zinc lozenges, taken in the first 24 hours of the common cold, can shorten the duration and lessen the severity of symptoms. Pain medicines may help with fever, muscle aches, and throat pain. A variety of non-prescription medicines are available to treat congestion and runny nose. Your caregiver   can make recommendations and may suggest nasal or lung inhalers for other symptoms.  HOME CARE INSTRUCTIONS   Only take over-the-counter or prescription medicines for pain, discomfort, or fever as directed by your  caregiver.  Use a warm mist humidifier or inhale steam from a shower to increase air moisture. This may keep secretions moist and make it easier to breathe.  Drink enough water and fluids to keep your urine clear or pale yellow.  Rest as needed.  Return to work when your temperature has returned to normal or as your caregiver advises. You may need to stay home longer to avoid infecting others. You can also use a face mask and careful hand washing to prevent spread of the virus. SEEK MEDICAL CARE IF:   After the first few days, you feel you are getting worse rather than better.  You need your caregiver's advice about medicines to control symptoms.  You develop chills, worsening shortness of breath, or brown or red sputum. These may be signs of pneumonia.  You develop yellow or brown nasal discharge or pain in the face, especially when you bend forward. These may be signs of sinusitis.  You develop a fever, swollen neck glands, pain with swallowing, or white areas in the back of your throat. These may be signs of strep throat. SEEK IMMEDIATE MEDICAL CARE IF:   You have a fever.  You develop severe or persistent headache, ear pain, sinus pain, or chest pain.  You develop wheezing, a prolonged cough, cough up blood, or have a change in your usual mucus (if you have chronic lung disease).  You develop sore muscles or a stiff neck. Document Released: 02/27/2001 Document Revised: 11/26/2011 Document Reviewed: 12/09/2013 Exit[employer] Patient Information 2015 [employer]. This information is not intended to replace advice given to you by your health care provider. Make sure you discuss any questions you have with your health care provider.  

## 2015-01-12 NOTE — ED Notes (Signed)
Patient declined school note, Rx, or copy of instructions after discussion of same w L Presson , PS. We wer advised to have them shredded. Was not provided w any new rx

## 2015-01-12 NOTE — ED Provider Notes (Signed)
CSN: [id number]     Arrival date & time 01/12/15  1215 History   First MD Initiated Contact with Patient 01/12/15 1442     Chief Complaint  Patient presents with  . Cough  . Fever  . Otalgia   (Consider location/radiation/quality/duration/timing/severity/associated sxs/prior Treatment) HPI Comments: PCP: [facility] Nonsmoker Student at [employer], Flonase and Qvar at home  Patient is a 44 y.o. female presenting with URI. The history is provided by the patient. The history is limited by a language barrier. A language interpreter was used.  URI Presenting symptoms: congestion, cough, ear pain, fatigue, fever, rhinorrhea and sore throat   Presenting symptoms: no facial pain   Severity:  Moderate Onset quality:  Gradual Duration:  3 days Timing:  Constant Progression:  Unchanged Chronicity:  New Associated symptoms comment:  +general malaise   Past Medical History  Diagnosis Date  . GERD (gastroesophageal reflux disease)   . Hypothyroidism   . IBS (irritable bowel syndrome)   . Anxiety   . Hearing difficulty     b/l, 2/2 congenital rubella s/p stapedectomy. Using hearing aid  . Headache(784.0) 04/2005    h/o headache in past that raised question of psuedotumor s/p therapeutic LP with an opening preassure of 18 cm H2O   . Hepatic cyst     6 mm on CT done done in 05/2005  . Family history of malignant neoplasm of gastrointestinal tract   . Shingles 03/2013   Past Surgical History  Procedure Laterality Date  . Abdominal hysterectomy      partial hysterecotmy 2003 for endoemtriosis with residula right ovaey. Multiple GYN surgeries for chronic pelvic pain  . Cesarean section  1994  . Stapedectomy  2007    Dr. Lucia Gaskins, left ear  . Laparoscopic ovarian cystectomy  2001  . Thyroid nodule removed  1997  . Total abdominal hysterectomy w/ bilateral salpingoophorectomy      left  . Abdominal adhesion surgery      removal from bowel  . Joint replacement    . Cholecystectomy  N/A 06/26/2013    Procedure: LAPAROSCOPIC CHOLECYSTECTOMY WITH ATTEMPTED INTRAOPERATIVE CHOLANGIOGRAM;  Surgeon: Harl Bowie, MD;  Location: [facility];  Service: General;  Laterality: N/A;   Family History  Problem Relation Age of Onset  . Breast cancer Mother   . Ovarian cancer Mother   . Diabetes Mother   . Stomach cancer Maternal Grandmother   . Colon cancer Maternal Grandmother   . Diabetes Maternal Grandmother   . Pancreatic cancer Father   . Prostate cancer Father   . Colon cancer Father   . Diabetes Father   . Heart disease Father   . Irritable bowel syndrome Father   . Kidney disease Father   . Heart disease      both grandmothers  . Bipolar disorder Daughter    History  Substance Use Topics  . Smoking status: Former Smoker    Types: Cigarettes    Quit date: 02/18/2008  . Smokeless tobacco: Never Used     Comment: quit 47yrs ago  . Alcohol Use: No   OB History    Gravida Para Term Preterm AB TAB SAB Ectopic Multiple Living   3 2 2  1 1    2      Review of Systems  Constitutional: Positive for fever and fatigue.  HENT: Positive for congestion, ear pain, rhinorrhea and sore throat.   Respiratory: Positive for cough.   All other systems reviewed and are negative.  Allergies  Onion; Shellfish allergy; Amitriptyline; Amoxicillin-pot clavulanate; Cefuroxime; Cephalexin; Ciprofloxacin; Clarithromycin; Doxycycline; Propranolol; Shrimp flavor; Sulfonamide derivatives; and Benadryl  Home Medications   Prior to Admission medications   Medication Sig Start Date End Date Taking? Authorizing Provider  albuterol (PROVENTIL HFA;VENTOLIN HFA) 108 (90 BASE) MCG/ACT inhaler Inhale into the lungs every 6 (six) hours as needed for wheezing or shortness of breath.    Historical Provider, MD  ALPRAZolam Duanne Moron) 0.25 MG tablet Take 1 tablet (0.25 mg total) by mouth at bedtime as needed for anxiety. 10/11/14   Sid Falcon, MD  Ascorbic Acid (VITAMIN C ER PO) Take by mouth.     Historical Provider, MD  azithromycin (ZITHROMAX Z-PAK) 250 MG tablet Take as directed. 09/16/14   Harden Mo, MD  benzonatate (TESSALON) 100 MG capsule Take 1 capsule (100 mg total) by mouth 3 (three) times daily as needed for cough. 01/12/15   Audelia Hives Noe Pittsley, PA  clindamycin (CLEOCIN) 300 MG capsule Take 1 capsule (300 mg total) by mouth 4 (four) times daily. 10/02/14   Harden Mo, MD  fluticasone (FLOVENT HFA) 110 MCG/ACT inhaler Inhale into the lungs 2 (two) times daily.    Historical Provider, MD  HYDROcodone-acetaminophen (NORCO/VICODIN) 5-325 MG per tablet Take 1-2 tablets every 6 hours as needed for severe pain 11/11/14   Carlisle Cater, PA-C  ipratropium (ATROVENT) 0.06 % nasal spray Place 2 sprays into both nostrils 4 (four) times daily. For nasal congestion 01/12/15   Lutricia Feil, PA  levothyroxine (SYNTHROID, LEVOTHROID) 25 MCG tablet Take 25 mcg by mouth daily before breakfast.    Historical Provider, MD  methocarbamol (ROBAXIN) 500 MG tablet Take 2 tablets (1,000 mg total) by mouth 4 (four) times daily. 11/11/14   Carlisle Cater, PA-C  naproxen (NAPROSYN) 500 MG tablet Take 1 tablet (500 mg total) by mouth 2 (two) times daily. 11/11/14   Carlisle Cater, PA-C  Norethindrone-Ethinyl Estradiol-Fe Biphas (LO LOESTRIN FE) 1 MG-10 MCG / 10 MCG tablet Take 1 tablet by mouth daily. 06/28/14   Osborne Oman, MD  predniSONE (DELTASONE) 10 MG tablet Take 2 tablets (20 mg total) by mouth daily. X 3 days 01/12/15   Lutricia Feil, PA  SUMAtriptan (IMITREX) 100 MG tablet Take 100 mg by mouth every 2 (two) hours as needed for migraine or headache. May repeat in 2 hours if headache persists or recurs.    Historical Provider, MD  traMADol (ULTRAM) 50 MG tablet 1 to 2 tablets every 8 hours as needed for cough 09/16/14   Harden Mo, MD   BP 145/105 mmHg  Pulse 90  Temp(Src) 98.6 F (37 C) (Oral)  Resp 16  SpO2 97%  LMP 12/26/2004 Physical Exam  Constitutional: She is  oriented to person, place, and time. She appears well-developed and well-nourished. No distress.  HENT:  Head: Normocephalic and atraumatic.  Right Ear: Hearing, tympanic membrane, external ear and ear canal normal.  Left Ear: Hearing, tympanic membrane, external ear and ear canal normal.  Nose: Nose normal.  Mouth/Throat: Uvula is midline and mucous membranes are normal. Posterior oropharyngeal erythema present. No oropharyngeal exudate, posterior oropharyngeal edema or tonsillar abscesses.  Eyes: Conjunctivae are normal.  Neck: Normal range of motion. Neck supple.  Cardiovascular: Normal rate, regular rhythm and normal heart sounds.   Pulmonary/Chest: Effort normal and breath sounds normal. No stridor. No respiratory distress. She has no wheezes.  Musculoskeletal: Normal range of motion.  Lymphadenopathy:    She has  no cervical adenopathy.  Neurological: She is alert and oriented to person, place, and time.  Skin: Skin is warm and dry.  Psychiatric: She has a normal mood and affect. Her behavior is normal.  Nursing note and vitals reviewed.   ED Course  Procedures (including critical care time) Labs Review Labs Reviewed - No data to display  Imaging Review No results found.   MDM   1. URI (upper respiratory infection)   Tessalon 3 day course of prednisone Atrovent nasal spray Fluids, tylenol, rest Expect improvement over next 5-7 days If not, follow up with PCP   Lutricia Feil, PA 01/12/15 [phone]

## 2015-01-12 NOTE — ED Notes (Addendum)
C/o 3 day duration of cough, fever, ea pain. Minimal relief w OTC medications. Barking type cough noted. History, assessment clopleted w aide of sign language interpreter

## 2015-01-12 NOTE — Progress Notes (Signed)
Donna Davis walks in to schedule an appointment for assessment of a severe cough.  She states that she has had a cough productive of yellow/green sputum X 4 days associated with sore throat, general malaise, and chest soreness and headache (with the coughing).  She denies any fevers, shakes, or chills.  She denies a history of asthma, has not smoked in years, denies any reflux symptoms, and is unsure if she has had any sick contacts recently as she is a Ship broker at [employer].  She has had no post-tussive syncope. She denies any recent new medications. She does not believe she has had a recent Tdap immunization.  Exam reveals a WDWN deaf woman who communicates well by reading lips and via writing on paper.  Her oropharynx is erythematous but w/o exudates or adenopathy.  Lungs are clear w/o wheezes.  She has a very loud whooping cough that is frequent and occurs in paroxysms, but non-productive while in the office.  She likely has a viral URI as the cause of her cough, but it is debilitating and I am struck by the whooping and paroxysmal nature.  For this reason, I will include whooping cough in the differential.  She will provide a sputum for Bordetella culture and PCR and bring to her formal appointment/evaluation tomorrow which is scheduled for 9:45 AM.  Once a specimen has been obtained she will start azithromycin (Z-pack).  She was also provided a cough syrup with codeine in hopes of providing her some symptomatic relief for her cough associated chest discomfort and headache and allow her to get some sleep.  At follow-up a Tdap should be considered.

## 2015-01-12 NOTE — ED Notes (Signed)
Patient has concerns about the tessalon. C/o this Rx does not work for her

## 2015-01-13 ENCOUNTER — Ambulatory Visit (INDEPENDENT_AMBULATORY_CARE_PROVIDER_SITE_OTHER): Payer: Medicare Other | Admitting: Internal Medicine

## 2015-01-13 ENCOUNTER — Encounter: Payer: Self-pay | Admitting: Internal Medicine

## 2015-01-13 VITALS — BP 133/86 | HR 94 | Temp 98.8°F | Ht 61.0 in | Wt 217.4 lb

## 2015-01-13 DIAGNOSIS — J209 Acute bronchitis, unspecified: Secondary | ICD-10-CM

## 2015-01-13 DIAGNOSIS — R05 Cough: Secondary | ICD-10-CM

## 2015-01-13 DIAGNOSIS — R059 Cough, unspecified: Secondary | ICD-10-CM

## 2015-01-13 NOTE — Progress Notes (Signed)
Patient ID: Donna Davis, female   DOB: 1971/01/24, 44 y.o.   MRN: [id number]   Subjective:   HPI: Donna Davis is a 44 y.o. woman with past medical history below presents for reevaluation of her severe cough. She was seen yesterday as a walk-in by Dr. Eppie Gibson, who felt that her cough could possibly be due to whooping cough. He recommended that she returns today for formal evaluation and also turn his in a sputum sample for Bordetella pertusis culture and PCR before she starts Azithromycin. She has the pills but she has not started taking it yet. She states that it has been difficult to obtain a good sputum sample. All she can get is mostly light sputum mixed with saliva.  The cough has persisted despite the codeine cough syrup, which was prescribed yesterday. She has also been using Tylenol and she feels that she does have some on and off fevers but she has not checked her body temperature. Her cough symptoms have been going on for 5 days and they are associated with sore throat, general malaise and chest soreness and headaches mostly associated with coughing episodes. She denies shakes or chills. She has no history of asthma or acid reflux. She is not a smoker. She had an episode of a similar whooping cough several years ago and she is unsure whether she has received Tdap immunization. Prior to her visit yesterday, she had been to urgent care where she was been prescribed Prednisone with a diagnosis of bronchitis. She did not fill this rx.  Donna Davis is deaf and comes in with an interpreter who was present in the room throughout the entire visit.   Past Medical History  Diagnosis Date  . GERD (gastroesophageal reflux disease)   . Hypothyroidism   . IBS (irritable bowel syndrome)   . Anxiety   . Hearing difficulty     b/l, 2/2 congenital rubella s/p stapedectomy. Using hearing aid  . Headache(784.0) 04/2005    h/o headache in past that raised question of psuedotumor s/p therapeutic LP with an  opening preassure of 18 cm H2O   . Hepatic cyst     6 mm on CT done done in 05/2005  . Family history of malignant neoplasm of gastrointestinal tract   . Shingles 03/2013    ROS: CVS: No chest pain, palpitations and leg swelling.  GI: No abdominal pain, nausea, vomiting, bloody stools GU: No dysuria, frequency, hematuria, or flank pain.  MSK: No myalgias, back pain, joint swelling, arthralgias  Psych: No depression symptoms. No SI or SA.    Objective:  Physical Exam: Filed Vitals:   01/13/15 1001  BP: 133/86  Pulse: 94  Temp: 98.8 F (37.1 C)  TempSrc: Oral  Height: 5\' 1"  (1.549 m)  Weight: 217 lb 6.4 oz (98.612 kg)  SpO2: 99%   General: She is coughing a lot with hacking cough. Well nourished. Modere acute distress.  HEENT: Normal oral mucosa. MMM. Oropharynx is clear without exudates. No erythema.  Lungs: CTA bilaterally. No wheezing. Heart: tachycardia, RRR; no extra sounds or murmurs  Abdomen: Non-distended, normal bowel sounds, soft, nontender; no hepatosplenomegaly  Extremities: No pedal edema. No joint swelling or tenderness. Neurologic: Normal EOM,  Alert and oriented x3. No obvious neurologic/cranial nerve deficits.  Assessment & Plan:  Discussed case with Dr Lynnae January. See problem based charting for assessment and plan.

## 2015-01-13 NOTE — Assessment & Plan Note (Signed)
Assessment: Most likely diagnosis is whooping cough versus viral bronchitis. She is afebrile and her oropharynx and lung exam is normal. However, she does have tachycardia of 94. She picked up her prescription of Azithromycin but she hasn't started taking it. She is taking her codeine syrup, though it hasn't helped much. She brings in her sputum sample.   Plan: 1. Labs/imaging: Sputum Bordetella pertussis PCR and culture. We will contact her with results. 2. Therapy: Start taking Azithromycin, 2 pills today, followed by one pill daily for 4 days. Continue with Codeine syrup. I also recommended a mixture of hot tea, honey, ginger and lemon juice 3. Follow up: Provided her with written information on return precautions and home care for her severe cough. Recommended that she follows up in 7 days to see how she is doing. She will also need a Tdap immunization once this episode has resolved.

## 2015-01-13 NOTE — Patient Instructions (Signed)
General Instructions: Please take you antibiotics as prescribed yesterday  Pleas come back in 7 days to see how you are doing   Thank you for bringing your medicines today. This helps [place] keep you safe from mistakes.   Progress Toward Treatment Goals:  No flowsheet data found.  Self Care Goals & Plans:  Self Care Goal 12/11/2012  Manage my medications take my medicines as prescribed; bring my medications to every visit    No flowsheet data found.   Care Management & Community Referrals:  Referral 03/09/2013  Referrals made to community resources none      Pertussis Pertussis (whooping cough) is an infection that causes severe and sudden coughing attacks. CAUSES  Pertussis is caused by bacteria. It is very contagious and spreads to others by the droplets sprayed in the air when an infected person talks, coughs, and sneezes. You may have caught pertussis from inhaling these droplets or from touching a surface where the droplets fell and then touching your mouth or nose. SIGNS AND SYMPTOMS  Early during this infection, symptoms of pertussis are similar to those of the common cold. They include a runny nose, low fever, mild cough, and red, watery eyes. After 1-2 weeks the cold symptoms get better, but the cough worsens and severe and sudden coughing attacks frequently develop. During these attacks people may cough so hard that vomiting occurs. Over the next month to 6 weeks, the cough starts to get better, but it may take as long as 6 months for the cough to go away completely. DIAGNOSIS  Your health care provider will perform a physical exam. The health care provider may take a mucus sample from the nose and throat and a blood sample to help confirm the diagnosis. The health care provider may also take a chest X-ray. TREATMENT  Antibiotic medicines are usually prescribed for this infection. Starting antibiotics quickly may help shorten the illness and make it less contagious.  Antibiotics may also be prescribed for everyone living in the same household. Immunization may be recommended for those in the household at risk of developing pertussis. At-risk groups include:  Infants.  Those who have not had their full course of pertussis immunizations.  Those who were immunized but have not had their recent booster shot. Mild coughing may continue for months after the infection is treated from the remaining soreness and inflammation in the lungs. HOME CARE INSTRUCTIONS   If you were prescribed an antibiotic medicine, finish it all even if you start to feel better.  Do not take cough medicine unless prescribed by your health care provider. Coughing is a protective mechanism that helps keep colored mucus (sputum) and secretions from clogging breathing passages.  Stay away from those who are at risk of developing pertussis for the first 5 days of antibiotic treatment. If no antibiotics are prescribed, stay at home for the first 3 weeks you are coughing.  Do not go to work until you have been treated with antibiotics for 5 days. If no antibiotics are prescribed, do not go to work for the first 3 weeks you are coughing. Inform your workplace that you were diagnosed with pertussis.  Wash your hands often. Those living in the same household should also wash their hands often to avoid spreading the infection.  Avoid substances that may irritate the lungs, such as smoke, aerosols, or fumes. These substances may worsen your coughing.  If you are having a coughing attack:  Raise the head of your mattress to help  clear sputum more easily and improve breathing.  Sit upright.  Use a cool mist humidifier at home to increase air moisture. This will soothe your cough and help loosen sputum. Do not use hot steam.  Rest as much as possible. Normal activity may be gradually resumed.  Drink enough fluids to keep your urine clear or pale yellow. PREVENTION  Pertussis can be prevented  with a vaccine and later booster shots. The pertussis vaccine is usually given during childhood. Adults who were not previously vaccinated should be vaccinated as soon as possible. Adults who were previously vaccinated should talk to their health care providers about the need for a booster shot because immunity from the vaccine decreases over time. All of the following persons should consider receiving a booster dose of pertussis, which is combined with tetanus and diphtheria (Tdap) vaccine:  Pregnant women during each pregnancy, preferably at 27-36 weeks of pregnancy (gestation).  All persons who have or will have close contact with an infant aged less than 12 months. Infants are at highest risk for life-threatening complications from pertussis.  All health care personnel. SEEK MEDICAL CARE IF:   You have persistent vomiting.  You are not able to eat or drink fluids.  You do not seem to be improving.  You have a fever. SEEK IMMEDIATE MEDICAL CARE IF:   Your face turns red or blue during a coughing attack.  You become unconscious after a coughing attack, even if only for a few moments.  Your breathing stops for a period of time (apnea).  You are restless or cannot sleep.  You are listless or sleeping too much. MAKE SURE YOU:  Understand these instructions.   Will watch your condition.   Will get help right away if you are not doing well or get worse. Document Released: 12/29/2012 Document Revised: 01/18/2014 Document Reviewed: 12/29/2012 [facility] Patient Information 2015 [place], [place]. This information is not intended to replace advice given to you by your health care provider. Make sure you discuss any questions you have with your health care provider.

## 2015-01-13 NOTE — Addendum Note (Signed)
Addended by: Orson Gear on: 01/13/2015 11:59 AM   Modules accepted: Orders

## 2015-01-13 NOTE — Addendum Note (Signed)
Addended by: Truddie Crumble on: 01/13/2015 11:22 AM   Modules accepted: Orders

## 2015-01-14 LAB — BORDETELLA PERTUSSIS PCR
B parapertussis, DNA: NEGATIVE
B pertussis, DNA: NEGATIVE

## 2015-01-14 NOTE — Progress Notes (Signed)
Internal Medicine Clinic Attending  Case discussed with Dr. Kazibwe soon after the resident saw the patient.  We reviewed the resident's history and exam and pertinent patient test results.  I agree with the assessment, diagnosis, and plan of care documented in the resident's note. 

## 2015-01-25 DIAGNOSIS — R202 Paresthesia of skin: Secondary | ICD-10-CM | POA: Diagnosis not present

## 2015-01-25 DIAGNOSIS — M9901 Segmental and somatic dysfunction of cervical region: Secondary | ICD-10-CM | POA: Diagnosis not present

## 2015-01-25 DIAGNOSIS — M5033 Other cervical disc degeneration, cervicothoracic region: Secondary | ICD-10-CM | POA: Diagnosis not present

## 2015-01-25 DIAGNOSIS — M531 Cervicobrachial syndrome: Secondary | ICD-10-CM | POA: Diagnosis not present

## 2015-01-28 ENCOUNTER — Encounter: Payer: Self-pay | Admitting: Internal Medicine

## 2015-01-31 ENCOUNTER — Encounter: Payer: Self-pay | Admitting: Internal Medicine

## 2015-01-31 ENCOUNTER — Ambulatory Visit: Payer: Medicare Other | Admitting: Internal Medicine

## 2015-02-09 ENCOUNTER — Emergency Department (HOSPITAL_COMMUNITY)
Admission: EM | Admit: 2015-02-09 | Discharge: 2015-02-09 | Disposition: A | Discharge status: Home / Self Care | Payer: Medicare Other | Attending: Emergency Medicine | Admitting: Emergency Medicine

## 2015-02-09 ENCOUNTER — Encounter (HOSPITAL_COMMUNITY): Payer: Self-pay | Admitting: *Deleted

## 2015-02-09 ENCOUNTER — Emergency Department (HOSPITAL_COMMUNITY): Payer: Medicare Other

## 2015-02-09 DIAGNOSIS — Z79899 Other long term (current) drug therapy: Secondary | ICD-10-CM | POA: Diagnosis not present

## 2015-02-09 DIAGNOSIS — S8991XA Unspecified injury of right lower leg, initial encounter: Secondary | ICD-10-CM | POA: Diagnosis not present

## 2015-02-09 DIAGNOSIS — F419 Anxiety disorder, unspecified: Secondary | ICD-10-CM | POA: Diagnosis not present

## 2015-02-09 DIAGNOSIS — Y929 Unspecified place or not applicable: Secondary | ICD-10-CM | POA: Insufficient documentation

## 2015-02-09 DIAGNOSIS — H9190 Unspecified hearing loss, unspecified ear: Secondary | ICD-10-CM | POA: Diagnosis not present

## 2015-02-09 DIAGNOSIS — Z880 Allergy status to penicillin: Secondary | ICD-10-CM | POA: Insufficient documentation

## 2015-02-09 DIAGNOSIS — Z8619 Personal history of other infectious and parasitic diseases: Secondary | ICD-10-CM | POA: Diagnosis not present

## 2015-02-09 DIAGNOSIS — Z87891 Personal history of nicotine dependence: Secondary | ICD-10-CM | POA: Insufficient documentation

## 2015-02-09 DIAGNOSIS — Y939 Activity, unspecified: Secondary | ICD-10-CM | POA: Diagnosis not present

## 2015-02-09 DIAGNOSIS — Z791 Long term (current) use of non-steroidal anti-inflammatories (NSAID): Secondary | ICD-10-CM | POA: Diagnosis not present

## 2015-02-09 DIAGNOSIS — E039 Hypothyroidism, unspecified: Secondary | ICD-10-CM | POA: Diagnosis not present

## 2015-02-09 DIAGNOSIS — Y999 Unspecified external cause status: Secondary | ICD-10-CM | POA: Insufficient documentation

## 2015-02-09 DIAGNOSIS — Z8719 Personal history of other diseases of the digestive system: Secondary | ICD-10-CM | POA: Diagnosis not present

## 2015-02-09 DIAGNOSIS — M25561 Pain in right knee: Secondary | ICD-10-CM | POA: Diagnosis not present

## 2015-02-09 DIAGNOSIS — R52 Pain, unspecified: Secondary | ICD-10-CM

## 2015-02-09 DIAGNOSIS — W541XXA Struck by dog, initial encounter: Secondary | ICD-10-CM | POA: Diagnosis not present

## 2015-02-09 DIAGNOSIS — Z7951 Long term (current) use of inhaled steroids: Secondary | ICD-10-CM | POA: Diagnosis not present

## 2015-02-09 MED ORDER — OXYCODONE-ACETAMINOPHEN 5-325 MG PO TABS: 1.0000 | ORAL_TABLET | ORAL | Status: DC | PRN

## 2015-02-09 MED ORDER — ONDANSETRON 4 MG PO TBDP
4.0000 mg | ORAL_TABLET | Freq: Once | ORAL | Status: AC
  Administered 2015-02-09: 4 mg via ORAL
  Filled 2015-02-09: qty 1

## 2015-02-09 MED ORDER — NAPROXEN 375 MG PO TABS: 375.0000 mg | ORAL_TABLET | Freq: Two times a day (BID) | ORAL | Status: DC

## 2015-02-09 MED ORDER — OXYCODONE-ACETAMINOPHEN 5-325 MG PO TABS
2.0000 | ORAL_TABLET | Freq: Once | ORAL | Status: AC
  Administered 2015-02-09: 2 via ORAL
  Filled 2015-02-09: qty 2

## 2015-02-09 NOTE — Discharge Instructions (Signed)

## 2015-02-09 NOTE — ED Notes (Signed)
Patient transported to X-ray 

## 2015-02-09 NOTE — ED Notes (Signed)
The pts 70 lb dog ran into her rt lower leg earlier tonight.  The leg was not in good alighment.  Knee swollen painful lower leg also  lmp none

## 2015-02-09 NOTE — ED Provider Notes (Signed)
CSN: [id number]     Arrival date & time 02/09/15  2120 History  This chart was scribed for non-physician practitioner, Margarita Mail, PA-C working with Ernestina Patches, MD by Hansel Feinstein, ED scribe. This patient was seen in room TR08C/TR08C and the patient's care was started at 10:38 PM    Chief Complaint  Patient presents with  . Leg Injury   The history is provided by the patient and a relative. No language interpreter was used.   HPI Comments: Donna Davis is a 44 y.o. female who presents to the Emergency Department complaining of an injury sustained to the medial aspect of her right leg when a large dog ran into her with full force earlier this evening. She reports that the force from the impact caused her to fall from a standing position and landed on her left side. Per pt, her feet were planted firmly on the ground and her knees were most likely locked prior to impact. She reports the pain onset immediately after the incident and worsened gradually over the course of 15 minutes.  She also notes edema in the right knee and calf as associated Sx. She denies LOC or head injury.   Past Medical History  Diagnosis Date  . GERD (gastroesophageal reflux disease)   . Hypothyroidism   . IBS (irritable bowel syndrome)   . Anxiety   . Hearing difficulty     b/l, 2/2 congenital rubella s/p stapedectomy. Using hearing aid  . Headache(784.0) 04/2005    h/o headache in past that raised question of psuedotumor s/p therapeutic LP with an opening preassure of 18 cm H2O   . Hepatic cyst     6 mm on CT done done in 05/2005  . Family history of malignant neoplasm of gastrointestinal tract   . Shingles 03/2013   Past Surgical History  Procedure Laterality Date  . Abdominal hysterectomy      partial hysterecotmy 2003 for endoemtriosis with residula right ovaey. Multiple GYN surgeries for chronic pelvic pain  . Cesarean section  1994  . Stapedectomy  2007    Dr. Lucia Gaskins, left ear  . Laparoscopic ovarian  cystectomy  2001  . Thyroid nodule removed  1997  . Total abdominal hysterectomy w/ bilateral salpingoophorectomy      left  . Abdominal adhesion surgery      removal from bowel  . Joint replacement    . Cholecystectomy N/A 06/26/2013    Procedure: LAPAROSCOPIC CHOLECYSTECTOMY WITH ATTEMPTED INTRAOPERATIVE CHOLANGIOGRAM;  Surgeon: Harl Bowie, MD;  Location: [facility];  Service: General;  Laterality: N/A;   Family History  Problem Relation Age of Onset  . Breast cancer Mother   . Ovarian cancer Mother   . Diabetes Mother   . Stomach cancer Maternal Grandmother   . Colon cancer Maternal Grandmother   . Diabetes Maternal Grandmother   . Pancreatic cancer Father   . Prostate cancer Father   . Colon cancer Father   . Diabetes Father   . Heart disease Father   . Irritable bowel syndrome Father   . Kidney disease Father   . Heart disease      both grandmothers  . Bipolar disorder Daughter    History  Substance Use Topics  . Smoking status: Former Smoker    Types: Cigarettes    Quit date: 02/18/2008  . Smokeless tobacco: Never Used     Comment: quit 98yrs ago  . Alcohol Use: No   OB History    Gravida Para  Term Preterm AB TAB SAB Ectopic Multiple Living   3 2 2  1 1    2      Review of Systems  Musculoskeletal: Positive for joint swelling and arthralgias.  Skin: Negative for wound.      Allergies  Onion; Shellfish allergy; Amitriptyline; Amoxicillin-pot clavulanate; Cefuroxime; Cephalexin; Ciprofloxacin; Clarithromycin; Doxycycline; Propranolol; Shrimp flavor; Sulfonamide derivatives; and Benadryl  Home Medications   Prior to Admission medications   Medication Sig Start Date End Date Taking? Authorizing Provider  albuterol (PROVENTIL HFA;VENTOLIN HFA) 108 (90 BASE) MCG/ACT inhaler Inhale into the lungs every 6 (six) hours as needed for wheezing or shortness of breath.    Historical Provider, MD  ALPRAZolam Duanne Moron) 0.25 MG tablet Take 1 tablet (0.25 mg total) by  mouth at bedtime as needed for anxiety. 10/11/14   Sid Falcon, MD  Ascorbic Acid (VITAMIN C ER PO) Take by mouth.    Historical Provider, MD  azithromycin (ZITHROMAX Z-PAK) 250 MG tablet 2 tablets on day 1 and then 1 tablet on days 2-5. 01/12/15   Oval Linsey, MD  benzonatate (TESSALON) 100 MG capsule Take 1 capsule (100 mg total) by mouth 3 (three) times daily as needed for cough. Patient not taking: Reported on 01/13/2015 01/12/15   Audelia Hives Presson, PA  fluticasone (FLOVENT HFA) 110 MCG/ACT inhaler Inhale into the lungs 2 (two) times daily.    Historical Provider, MD  guaiFENesin-codeine 100-10 MG/5ML syrup Take 5 mLs by mouth 3 (three) times daily as needed for cough. 01/12/15   Oval Linsey, MD  HYDROcodone-acetaminophen (NORCO/VICODIN) 5-325 MG per tablet Take 1-2 tablets every 6 hours as needed for severe pain 11/11/14   Carlisle Cater, PA-C  ipratropium (ATROVENT) 0.06 % nasal spray Place 2 sprays into both nostrils 4 (four) times daily. For nasal congestion 01/12/15   Lutricia Feil, PA  levothyroxine (SYNTHROID, LEVOTHROID) 25 MCG tablet Take 25 mcg by mouth daily before breakfast.    Historical Provider, MD  methocarbamol (ROBAXIN) 500 MG tablet Take 2 tablets (1,000 mg total) by mouth 4 (four) times daily. 11/11/14   Carlisle Cater, PA-C  naproxen (NAPROSYN) 500 MG tablet Take 1 tablet (500 mg total) by mouth 2 (two) times daily. 11/11/14   Carlisle Cater, PA-C  Norethindrone-Ethinyl Estradiol-Fe Biphas (LO LOESTRIN FE) 1 MG-10 MCG / 10 MCG tablet Take 1 tablet by mouth daily. 06/28/14   Osborne Oman, MD  SUMAtriptan (IMITREX) 100 MG tablet Take 100 mg by mouth every 2 (two) hours as needed for migraine or headache. May repeat in 2 hours if headache persists or recurs.    Historical Provider, MD  traMADol (ULTRAM) 50 MG tablet 1 to 2 tablets every 8 hours as needed for cough 09/16/14   Harden Mo, MD   BP 149/77 mmHg  Pulse 89  Temp(Src) 99.2 F (37.3 C) (Oral)   Resp 18  SpO2 97%  LMP 12/26/2004 Physical Exam  Constitutional: She is oriented to person, place, and time. She appears well-developed and well-nourished. No distress.  HENT:  Head: Normocephalic and atraumatic.  Eyes: Conjunctivae and EOM are normal.  Neck: Neck supple.  Cardiovascular: Normal rate.   Pulmonary/Chest: Breath sounds normal. No respiratory distress.  Musculoskeletal:  Exquisite TTP of the medial side of the right leg. There is mild swelling. Ligaments are stable. Pain with any ROM. Strength reduced because of pain. Pain worsened by flexion.   Neurological: She is alert and oriented to person, place, and time.  Skin: Skin  is warm.  Psychiatric: Her behavior is normal.  Nursing note and vitals reviewed.   ED Course  Procedures (including critical care time) DIAGNOSTIC STUDIES: Oxygen Saturation is 97% on RA, normal by my interpretation.    COORDINATION OF CARE: 10:50 PM Discussed XR findings and treatment plan with pt at bedside which includes the RICE method and referral to an Orthopedist. Pt agreed to plan.   Labs Review Labs Reviewed - No data to display  Imaging Review Dg Tibia/fibula Right  02/09/2015   CLINICAL DATA:  Patient fell after being hit by dog  EXAM: RIGHT TIBIA AND FIBULA - 2 VIEW  COMPARISON:  None.  FINDINGS: Frontal and lateral views were obtained. There is no fracture or dislocation. No abnormal periosteal reaction. There is slight narrowing of the medial compartment of the knee joint.  IMPRESSION: Slight narrowing medial compartment knee joint. No fracture or dislocation.   Electronically Signed   By: Lowella Grip III M.D.   On: 02/09/2015 22:01   Dg Knee Complete 4 Views Right  02/09/2015   CLINICAL DATA:  Pain following fall after being hit by dog  EXAM: RIGHT KNEE - COMPLETE 4+ VIEW  COMPARISON:  None.  FINDINGS: Frontal, lateral, and bilateral oblique views were obtained. There is no fracture or dislocation. No effusion. There is mild  narrowing medially. Other joint spaces appear intact.  IMPRESSION: Mild narrowing medially.  No fracture or effusion.   Electronically Signed   By: Lowella Grip III M.D.   On: 02/09/2015 21:58     EKG Interpretation None     MDM   Final diagnoses:  Knee injury, right, initial encounter    Patient X-Ray negative for obvious fracture or dislocation. Pain managed in ED. Pt advised to follow up with orthopedics if symptoms persist for possibility of missed fracture diagnosis. Patient given brace while in ED, conservative therapy recommended and discussed. Patient will be dc home & is agreeable with above plan.  I personally performed the services described in this documentation, which was scribed in my presence. The recorded information has been reviewed and is accurate.       Margarita Mail, PA-C 02/15/15 1945  Ernestina Patches, MD 02/17/15 [phone]

## 2015-02-15 DIAGNOSIS — M238X1 Other internal derangements of right knee: Secondary | ICD-10-CM | POA: Diagnosis not present

## 2015-02-15 DIAGNOSIS — M25561 Pain in right knee: Secondary | ICD-10-CM | POA: Diagnosis not present

## 2015-02-24 DIAGNOSIS — M238X1 Other internal derangements of right knee: Secondary | ICD-10-CM | POA: Diagnosis not present

## 2015-03-02 DIAGNOSIS — M6749 Ganglion, multiple sites: Secondary | ICD-10-CM | POA: Diagnosis not present

## 2015-03-02 DIAGNOSIS — S8391XA Sprain of unspecified site of right knee, initial encounter: Secondary | ICD-10-CM | POA: Diagnosis not present

## 2015-03-02 DIAGNOSIS — M67461 Ganglion, right knee: Secondary | ICD-10-CM | POA: Diagnosis not present

## 2015-03-15 DIAGNOSIS — S8391XD Sprain of unspecified site of right knee, subsequent encounter: Secondary | ICD-10-CM | POA: Diagnosis not present

## 2015-03-15 DIAGNOSIS — M9901 Segmental and somatic dysfunction of cervical region: Secondary | ICD-10-CM | POA: Diagnosis not present

## 2015-03-15 DIAGNOSIS — M5033 Other cervical disc degeneration, cervicothoracic region: Secondary | ICD-10-CM | POA: Diagnosis not present

## 2015-03-15 DIAGNOSIS — M531 Cervicobrachial syndrome: Secondary | ICD-10-CM | POA: Diagnosis not present

## 2015-03-15 DIAGNOSIS — R202 Paresthesia of skin: Secondary | ICD-10-CM | POA: Diagnosis not present

## 2015-03-18 HISTORY — PX: COCHLEAR IMPLANT: SUR684

## 2015-04-04 DIAGNOSIS — M25561 Pain in right knee: Secondary | ICD-10-CM | POA: Diagnosis not present

## 2015-04-04 DIAGNOSIS — M6749 Ganglion, multiple sites: Secondary | ICD-10-CM | POA: Diagnosis not present

## 2015-04-07 DIAGNOSIS — S8391XD Sprain of unspecified site of right knee, subsequent encounter: Secondary | ICD-10-CM | POA: Diagnosis not present

## 2015-04-11 ENCOUNTER — Other Ambulatory Visit: Payer: Self-pay | Admitting: Internal Medicine

## 2015-04-23 ENCOUNTER — Encounter: Payer: Self-pay | Admitting: Internal Medicine

## 2015-04-25 ENCOUNTER — Other Ambulatory Visit: Payer: Self-pay | Admitting: *Deleted

## 2015-04-25 DIAGNOSIS — N951 Menopausal and female climacteric states: Secondary | ICD-10-CM

## 2015-04-25 MED ORDER — ALPRAZOLAM 0.25 MG PO TABS: 0.2500 mg | ORAL_TABLET | Freq: Every evening | ORAL | Status: DC | PRN

## 2015-04-25 NOTE — Telephone Encounter (Signed)
Called home phone number - an interpreter help with message on Special phone hookup - pt needs an appt for BCP.

## 2015-04-25 NOTE — Telephone Encounter (Signed)
Xanax generic was called in to pharmacy.

## 2015-04-28 ENCOUNTER — Telehealth: Payer: Self-pay | Admitting: *Deleted

## 2015-04-28 DIAGNOSIS — N951 Menopausal and female climacteric states: Secondary | ICD-10-CM

## 2015-04-28 MED ORDER — NORETHIN-ETH ESTRAD-FE BIPHAS 1 MG-10 MCG / 10 MCG PO TABS
1.0000 | ORAL_TABLET | Freq: Every day | ORAL | Status: DC
Start: 2015-04-28 — End: 2015-06-09

## 2015-04-28 NOTE — Telephone Encounter (Signed)
Pt stopped by clinic requesting refill on her LoLoEstrin. I refilled med for 3 months and advised patient to make appointment for followup.

## 2015-05-02 DIAGNOSIS — S8391XA Sprain of unspecified site of right knee, initial encounter: Secondary | ICD-10-CM | POA: Diagnosis not present

## 2015-05-02 DIAGNOSIS — M238X1 Other internal derangements of right knee: Secondary | ICD-10-CM | POA: Diagnosis not present

## 2015-05-02 DIAGNOSIS — M67461 Ganglion, right knee: Secondary | ICD-10-CM | POA: Diagnosis not present

## 2015-05-02 DIAGNOSIS — M25561 Pain in right knee: Secondary | ICD-10-CM | POA: Diagnosis not present

## 2015-05-24 ENCOUNTER — Other Ambulatory Visit: Payer: Self-pay | Admitting: Internal Medicine

## 2015-05-24 ENCOUNTER — Other Ambulatory Visit: Payer: Self-pay | Admitting: *Deleted

## 2015-05-24 MED ORDER — ALPRAZOLAM 0.25 MG PO TABS: 0.2500 mg | ORAL_TABLET | Freq: Every evening | ORAL | Status: DC | PRN

## 2015-05-24 NOTE — Telephone Encounter (Signed)
Rx called into pharmacy. Pt aware.  

## 2015-06-09 ENCOUNTER — Encounter: Payer: Self-pay | Admitting: Obstetrics & Gynecology

## 2015-06-09 ENCOUNTER — Ambulatory Visit (INDEPENDENT_AMBULATORY_CARE_PROVIDER_SITE_OTHER): Payer: Medicare Other | Admitting: Obstetrics & Gynecology

## 2015-06-09 VITALS — BP 149/86 | HR 83 | Ht 60.0 in | Wt 210.6 lb

## 2015-06-09 DIAGNOSIS — Z01419 Encounter for gynecological examination (general) (routine) without abnormal findings: Secondary | ICD-10-CM | POA: Diagnosis not present

## 2015-06-09 DIAGNOSIS — N951 Menopausal and female climacteric states: Secondary | ICD-10-CM | POA: Diagnosis not present

## 2015-06-09 MED ORDER — ESTRADIOL 1 MG PO TABS: 1.0000 mg | ORAL_TABLET | Freq: Every day | ORAL | Status: DC

## 2015-06-09 NOTE — Progress Notes (Signed)
GYNECOLOGY CLINIC ANNUAL PREVENTATIVE CARE ENCOUNTER NOTE  Subjective:   Donna Davis is a 44 y.o. [phone] female here for a routine annual gynecologic exam.  Current complaints: menopausal symptoms.   Denies abnormal vaginal bleeding, discharge, pelvic pain, problems with intercourse or other gynecologic concerns.    Gynecologic History Patient's last menstrual period was 12/26/2004. Contraception: status post hysterectomy Last mammogram: 06/09/12 at [facility]. Results were: normal  Obstetric History OB History  Gravida Para Term Preterm AB SAB TAB Ectopic Multiple Living  3 2 2  1  1   2     # Outcome Date GA Lbr Len/2nd Weight Sex Delivery Anes PTL Lv  3 TAB           2 Term     F CS-Unspec     1 Term     M Vag-Spont         Past Medical History  Diagnosis Date  . GERD (gastroesophageal reflux disease)   . Hypothyroidism   . IBS (irritable bowel syndrome)   . Anxiety   . Hearing difficulty     b/l, 2/2 congenital rubella s/p stapedectomy. Using hearing aid  . Headache(784.0) 04/2005    h/o headache in past that raised question of psuedotumor s/p therapeutic LP with an opening preassure of 18 cm H2O   . Hepatic cyst     6 mm on CT done done in 05/2005  . Family history of malignant neoplasm of gastrointestinal tract   . Shingles 03/2013    Past Surgical History  Procedure Laterality Date  . Total abdominal hysterectomy  2005    Endometriosis with residual right ovary. Multiple GYN surgeries for chronic pelvic pain; had LSO in past  . Cesarean section  1994  . Stapedectomy  2007    Dr. Lucia Gaskins, left ear  . Laparoscopic ovarian cystectomy  2001  . Thyroid nodule removed  1997  . Abdominal adhesion surgery      removal from bowel  . Joint replacement    . Cholecystectomy N/A 06/26/2013    Procedure: LAPAROSCOPIC CHOLECYSTECTOMY WITH ATTEMPTED INTRAOPERATIVE CHOLANGIOGRAM;  Surgeon: Harl Bowie, MD;  Location: [facility];  Service: General;  Laterality: N/A;  .  Salpingoophorectomy Left     Current Outpatient Prescriptions on File Prior to Visit  Medication Sig Dispense Refill  . albuterol (PROVENTIL HFA;VENTOLIN HFA) 108 (90 BASE) MCG/ACT inhaler Inhale into the lungs every 6 (six) hours as needed for wheezing or shortness of breath.    . ALPRAZolam (XANAX) 0.25 MG tablet Take 1 tablet (0.25 mg total) by mouth at bedtime as needed for anxiety. 30 tablet 2  . Ascorbic Acid (VITAMIN C ER PO) Take by mouth.    . fluticasone (FLOVENT HFA) 110 MCG/ACT inhaler Inhale into the lungs 2 (two) times daily.    Marland Kitchen ipratropium (ATROVENT) 0.06 % nasal spray Place 2 sprays into both nostrils 4 (four) times daily. For nasal congestion 15 mL 0  . levothyroxine (SYNTHROID, LEVOTHROID) 25 MCG tablet TAKE 1 TABLET BY MOUTH DAILY 31 tablet 11  . Norethindrone-Ethinyl Estradiol-Fe Biphas (LO LOESTRIN FE) 1 MG-10 MCG / 10 MCG tablet Take 1 tablet by mouth daily. 3 Package 3  . benzonatate (TESSALON) 100 MG capsule Take 1 capsule (100 mg total) by mouth 3 (three) times daily as needed for cough. (Patient not taking: Reported on 01/13/2015) 21 capsule 0  . methocarbamol (ROBAXIN) 500 MG tablet Take 2 tablets (1,000 mg total) by mouth 4 (four) times  daily. (Patient not taking: Reported on 06/09/2015) 20 tablet 0  . naproxen (NAPROSYN) 375 MG tablet Take 1 tablet (375 mg total) by mouth 2 (two) times daily. (Patient not taking: Reported on 06/09/2015) 20 tablet 0  . oxyCODONE-acetaminophen (PERCOCET) 5-325 MG per tablet Take 1-2 tablets by mouth every 4 (four) hours as needed. (Patient not taking: Reported on 06/09/2015) 20 tablet 0  . SUMAtriptan (IMITREX) 100 MG tablet Take 100 mg by mouth every 2 (two) hours as needed for migraine or headache. May repeat in 2 hours if headache persists or recurs.     No current facility-administered medications on file prior to visit.    Allergies  Allergen Reactions  . Onion Anaphylaxis  . Shellfish Allergy Anaphylaxis and Swelling  .  Amitriptyline Anxiety, Palpitations and Other (See Comments)    Hallucinations  . Amoxicillin-Pot Clavulanate Nausea And Vomiting    With high dose, low dose ok  . Cefuroxime Nausea And Vomiting  . Cephalexin Other (See Comments)     gi upset  . Ciprofloxacin Other (See Comments)     Fevers  . Clarithromycin Nausea And Vomiting    Pt is OK with azithromycin  . Doxycycline Other (See Comments)     gi upset  . Propranolol Other (See Comments)    Fatigue and makes her "feel crazy", refuses to take.  . Shrimp Flavor Other (See Comments)    unknown  . Sulfonamide Derivatives Other (See Comments)    vaginal blisters  . Benadryl [Diphenhydramine Hcl (Sleep)] Anxiety    Extreme agitation    Social History   Social History  . Marital Status: Married    Spouse Name: N/A  . Number of Children: 2  . Years of Education: N/A   Occupational History  . student    Social History Main Topics  . Smoking status: Former Smoker    Types: Cigarettes    Quit date: 02/18/2008  . Smokeless tobacco: Never Used     Comment: quit 44yrs ago  . Alcohol Use: No  . Drug Use: No  . Sexual Activity: Yes    Birth Control/ Protection: Surgical   Other Topics Concern  . Not on file   Social History Narrative   Current smoker, not using alcohol or drugs at this time   Lives with husband and 2 children    Family History  Problem Relation Age of Onset  . Breast cancer Mother   . Ovarian cancer Mother   . Diabetes Mother   . Stomach cancer Maternal Grandmother   . Colon cancer Maternal Grandmother   . Diabetes Maternal Grandmother   . Pancreatic cancer Father   . Prostate cancer Father   . Colon cancer Father   . Diabetes Father   . Heart disease Father   . Irritable bowel syndrome Father   . Kidney disease Father   . Heart disease      both grandmothers  . Bipolar disorder Daughter     The following portions of the patient's history were reviewed and updated as appropriate: allergies,  current medications, past family history, past medical history, past social history, past surgical history and problem list.  Review of Systems Pertinent items are noted in HPI.   Objective:  BP 149/86 mmHg  Pulse 83  Ht 5' (1.524 m)  Wt 210 lb 9.6 oz (95.528 kg)  BMI 41.13 kg/m2  LMP 12/26/2004 CONSTITUTIONAL: Well-developed, well-nourished female in no acute distress.  HENT:  Normocephalic, atraumatic, External right and left  ear normal. Oropharynx is clear and moist EYES: Conjunctivae and EOM are normal. Pupils are equal, round, and reactive to light. No scleral icterus.  NECK: Normal range of motion, supple, no masses.  Normal thyroid.  SKIN: Skin is warm and dry. No rash noted. Not diaphoretic. No erythema. No pallor. Lexa: Alert and oriented to person, place, and time. Normal reflexes, muscle tone coordination. No cranial nerve deficit noted. PSYCHIATRIC: Normal mood and affect. Normal behavior. Normal judgment and thought content. CARDIOVASCULAR: Normal heart rate noted, regular rhythm RESPIRATORY: Clear to auscultation bilaterally. Effort and breath sounds normal, no problems with respiration noted. BREASTS: Symmetric in size. No masses, skin changes, nipple drainage, or lymphadenopathy. ABDOMEN: Soft, normal bowel sounds, no distention noted.  No tenderness, rebound or guarding.  PELVIC: Normal appearing external genitalia; normal appearing vaginal mucosa and cuff  No abnormal discharge noted.  No right adnexal tenderness. MUSCULOSKELETAL: Normal range of motion. No tenderness.  No cyanosis, clubbing, or edema.  2+ distal pulses.   Assessment:  Annual gynecologic examination Menopausal problems   Plan:  Mammogram scheduled Switched from Lo-Loestrin to Estradiol 1 mg daily (was on Estradiol 10 years ago) but feels that combined hormonal therapy helped more with her mood. Will observe response to estradiol; if mood worsens will go back to Lo-Loestrin. Routine  preventative health maintenance measures emphasized. Please refer to After Visit Summary for other counseling recommendations.    Verita Schneiders, MD, [place] Attending Obstetrician & Gynecologist, [place] for [facility]

## 2015-06-12 ENCOUNTER — Emergency Department (HOSPITAL_COMMUNITY)
Admission: EM | Admit: 2015-06-12 | Discharge: 2015-06-12 | Disposition: A | Discharge status: Home / Self Care | Payer: Medicare Other | Attending: Emergency Medicine | Admitting: Emergency Medicine

## 2015-06-12 ENCOUNTER — Emergency Department (HOSPITAL_COMMUNITY): Payer: Medicare Other

## 2015-06-12 ENCOUNTER — Encounter (HOSPITAL_COMMUNITY): Payer: Self-pay

## 2015-06-12 DIAGNOSIS — Z79899 Other long term (current) drug therapy: Secondary | ICD-10-CM | POA: Insufficient documentation

## 2015-06-12 DIAGNOSIS — K219 Gastro-esophageal reflux disease without esophagitis: Secondary | ICD-10-CM | POA: Diagnosis not present

## 2015-06-12 DIAGNOSIS — R1031 Right lower quadrant pain: Secondary | ICD-10-CM

## 2015-06-12 DIAGNOSIS — F419 Anxiety disorder, unspecified: Secondary | ICD-10-CM | POA: Diagnosis not present

## 2015-06-12 DIAGNOSIS — K5900 Constipation, unspecified: Secondary | ICD-10-CM

## 2015-06-12 DIAGNOSIS — E039 Hypothyroidism, unspecified: Secondary | ICD-10-CM | POA: Insufficient documentation

## 2015-06-12 DIAGNOSIS — R112 Nausea with vomiting, unspecified: Secondary | ICD-10-CM | POA: Diagnosis not present

## 2015-06-12 DIAGNOSIS — Z87891 Personal history of nicotine dependence: Secondary | ICD-10-CM | POA: Insufficient documentation

## 2015-06-12 DIAGNOSIS — Z880 Allergy status to penicillin: Secondary | ICD-10-CM | POA: Insufficient documentation

## 2015-06-12 DIAGNOSIS — Z7951 Long term (current) use of inhaled steroids: Secondary | ICD-10-CM | POA: Insufficient documentation

## 2015-06-12 DIAGNOSIS — E876 Hypokalemia: Secondary | ICD-10-CM | POA: Diagnosis not present

## 2015-06-12 DIAGNOSIS — Z8619 Personal history of other infectious and parasitic diseases: Secondary | ICD-10-CM | POA: Insufficient documentation

## 2015-06-12 LAB — CBC WITH DIFFERENTIAL/PLATELET
BASOS PCT: 2 %
Basophils Absolute: 0.2 10*3/uL — ABNORMAL HIGH (ref 0.0–0.1)
EOS ABS: 0.2 10*3/uL (ref 0.0–0.7)
EOS PCT: 2 %
HCT: 43.2 % (ref 36.0–46.0)
Hemoglobin: 14.2 g/dL (ref 12.0–15.0)
LYMPHS ABS: 2.9 10*3/uL (ref 0.7–4.0)
Lymphocytes Relative: 29 %
MCH: 30.6 pg (ref 26.0–34.0)
MCHC: 32.9 g/dL (ref 30.0–36.0)
MCV: 93.1 fL (ref 78.0–100.0)
Monocytes Absolute: 0.6 10*3/uL (ref 0.1–1.0)
Monocytes Relative: 6 %
Neutro Abs: 6.2 10*3/uL (ref 1.7–7.7)
Neutrophils Relative %: 61 %
PLATELETS: 294 10*3/uL (ref 150–400)
RBC: 4.64 MIL/uL (ref 3.87–5.11)
RDW: 12.4 % (ref 11.5–15.5)
WBC: 10 10*3/uL (ref 4.0–10.5)

## 2015-06-12 LAB — COMPREHENSIVE METABOLIC PANEL
ALK PHOS: 64 U/L (ref 38–126)
ALT: 19 U/L (ref 14–54)
ANION GAP: 13 (ref 5–15)
AST: 27 U/L (ref 15–41)
Albumin: 3.8 g/dL (ref 3.5–5.0)
BUN: 7 mg/dL (ref 6–20)
CALCIUM: 9 mg/dL (ref 8.9–10.3)
CO2: 20 mmol/L — ABNORMAL LOW (ref 22–32)
CREATININE: 0.77 mg/dL (ref 0.44–1.00)
Chloride: 103 mmol/L (ref 101–111)
Glucose, Bld: 77 mg/dL (ref 65–99)
Potassium: 3.3 mmol/L — ABNORMAL LOW (ref 3.5–5.1)
Sodium: 136 mmol/L (ref 135–145)
TOTAL PROTEIN: 7.2 g/dL (ref 6.5–8.1)
Total Bilirubin: 0.4 mg/dL (ref 0.3–1.2)

## 2015-06-12 LAB — URINALYSIS, ROUTINE W REFLEX MICROSCOPIC
BILIRUBIN URINE: NEGATIVE
Glucose, UA: NEGATIVE mg/dL
HGB URINE DIPSTICK: NEGATIVE
KETONES UR: NEGATIVE mg/dL
Leukocytes, UA: NEGATIVE
Nitrite: NEGATIVE
PROTEIN: NEGATIVE mg/dL
UROBILINOGEN UA: 0.2 mg/dL (ref 0.0–1.0)
pH: 5.5 (ref 5.0–8.0)

## 2015-06-12 LAB — LIPASE, BLOOD: Lipase: 29 U/L (ref 22–51)

## 2015-06-12 MED ORDER — SODIUM CHLORIDE 0.9 % IV BOLUS (SEPSIS)
1000.0000 mL | Freq: Once | INTRAVENOUS | Status: AC
Start: 2015-06-12 — End: 2015-06-12
  Administered 2015-06-12: 1000 mL via INTRAVENOUS

## 2015-06-12 MED ORDER — MORPHINE SULFATE (PF) 4 MG/ML IV SOLN
4.0000 mg | Freq: Once | INTRAVENOUS | Status: AC
  Administered 2015-06-12: 4 mg via INTRAVENOUS
  Filled 2015-06-12: qty 1

## 2015-06-12 MED ORDER — POLYETHYLENE GLYCOL 3350 17 G PO PACK: 17.0000 g | PACK | Freq: Two times a day (BID) | ORAL | Status: DC

## 2015-06-12 MED ORDER — IOHEXOL 300 MG/ML  SOLN
100.0000 mL | Freq: Once | INTRAMUSCULAR | Status: AC | PRN
  Administered 2015-06-12: 100 mL via INTRAVENOUS

## 2015-06-12 MED ORDER — IOHEXOL 300 MG/ML  SOLN
25.0000 mL | Freq: Once | INTRAMUSCULAR | Status: DC | PRN
  Administered 2015-06-12: 25 mL via ORAL
  Filled 2015-06-12: qty 30

## 2015-06-12 MED ORDER — ONDANSETRON HCL 4 MG/2ML IJ SOLN
4.0000 mg | Freq: Once | INTRAMUSCULAR | Status: AC
  Administered 2015-06-12: 4 mg via INTRAVENOUS
  Filled 2015-06-12: qty 2

## 2015-06-12 MED ORDER — ONDANSETRON HCL 8 MG PO TABS
8.0000 mg | ORAL_TABLET | Freq: Three times a day (TID) | ORAL | Status: DC | PRN
Start: 2015-06-12 — End: 2015-12-19

## 2015-06-12 NOTE — ED Notes (Signed)
Pt reports RLQ abdominal pain and vomiting onset last night, pain is getting worse. LBM yesterday but hard.

## 2015-06-12 NOTE — ED Notes (Signed)
Pt is hearing impaired, husband at bedside with sign language.

## 2015-06-12 NOTE — ED Provider Notes (Signed)
CSN: [id number]     Arrival date & time 06/12/15  1631 History   First MD Initiated Contact with Patient 06/12/15 1712     Chief Complaint  Patient presents with  . Abdominal Pain     (Consider location/radiation/quality/duration/timing/severity/associated sxs/prior Treatment) HPI Comments: Donna Davis is a 44 y.o. female with a PMHx of hearing impairment, GERD, hypothyroidism, hepatic cyst, IBS, and shingles, with a PSHx of total abd hysterectomy, ovarian cystectomy, abdominal adhesion surgery, and cholecystectomy, who presents to the ED with complaints of right lower abdominal pain since last night. She describes the pain as 10/10 right lower and upper abdominal pain which is constant tight and squeezing, nonradiating, worse with movement, and unrelieved with Tylenol. Associated symptoms include chills, nausea, and 4 episodes of nonbloody nonbilious emesis. Initially she thought that her pain was related to eating poorly recently and being more stressed out, but the pain has gradually worsened. She had a hard bowel movement this morning, and her last meal was earlier today when she ate half a biscuit and some ginger ale.  She denies any fevers, chest pain, shortness of breath, diarrhea, constipation, obstipation, melena, hematochezia, hematemesis, dysuria, hematuria, vaginal bleeding or discharge, numbness, tingling, weakness, recent travel, sick contacts, suspicious food intake, alcohol use, or NSAID use.   Patient is a 44 y.o. female presenting with abdominal pain. The history is provided by the patient. No language interpreter was used.  Abdominal Pain Pain location:  RLQ and RUQ Pain quality: squeezing   Pain radiates to:  Does not radiate Pain severity:  Severe Onset quality:  Gradual Duration:  1 day Timing:  Constant Progression:  Worsening Chronicity:  New Context: diet changes   Context: not recent travel, not sick contacts and not suspicious food intake   Relieved by:   Nothing Worsened by:  Movement Ineffective treatments:  Acetaminophen Associated symptoms: chills, nausea and vomiting   Associated symptoms: no chest pain, no constipation, no diarrhea, no dysuria, no fever, no flatus, no hematemesis, no hematochezia, no hematuria, no melena, no shortness of breath, no vaginal bleeding and no vaginal discharge   Risk factors: multiple surgeries and obesity     Past Medical History  Diagnosis Date  . GERD (gastroesophageal reflux disease)   . Hypothyroidism   . IBS (irritable bowel syndrome)   . Anxiety   . Hearing difficulty     b/l, 2/2 congenital rubella s/p stapedectomy. Using hearing aid  . Headache(784.0) 04/2005    h/o headache in past that raised question of psuedotumor s/p therapeutic LP with an opening preassure of 18 cm H2O   . Hepatic cyst     6 mm on CT done done in 05/2005  . Family history of malignant neoplasm of gastrointestinal tract   . Shingles 03/2013   Past Surgical History  Procedure Laterality Date  . Total abdominal hysterectomy  2005    Endometriosis with residual right ovary. Multiple GYN surgeries for chronic pelvic pain; had LSO in past  . Cesarean section  1994  . Stapedectomy  2007    Dr. Lucia Gaskins, left ear  . Laparoscopic ovarian cystectomy  2001  . Thyroid nodule removed  1997  . Abdominal adhesion surgery      removal from bowel  . Joint replacement    . Cholecystectomy N/A 06/26/2013    Procedure: LAPAROSCOPIC CHOLECYSTECTOMY WITH ATTEMPTED INTRAOPERATIVE CHOLANGIOGRAM;  Surgeon: Harl Bowie, MD;  Location: [facility];  Service: General;  Laterality: N/A;  . Salpingoophorectomy Left  Family History  Problem Relation Age of Onset  . Breast cancer Mother   . Ovarian cancer Mother   . Diabetes Mother   . Stomach cancer Maternal Grandmother   . Colon cancer Maternal Grandmother   . Diabetes Maternal Grandmother   . Pancreatic cancer Father   . Prostate cancer Father   . Colon cancer Father   . Diabetes  Father   . Heart disease Father   . Irritable bowel syndrome Father   . Kidney disease Father   . Heart disease      both grandmothers  . Bipolar disorder Daughter    Social History  Substance Use Topics  . Smoking status: Former Smoker    Types: Cigarettes    Quit date: 02/18/2008  . Smokeless tobacco: Never Used     Comment: quit 93yrs ago  . Alcohol Use: No   OB History    Gravida Para Term Preterm AB TAB SAB Ectopic Multiple Living   3 2 2  1 1    2      Review of Systems  Constitutional: Positive for chills. Negative for fever.  Respiratory: Negative for shortness of breath.   Cardiovascular: Negative for chest pain.  Gastrointestinal: Positive for nausea, vomiting and abdominal pain. Negative for diarrhea, constipation, blood in stool, melena, hematochezia, flatus and hematemesis.  Genitourinary: Negative for dysuria, hematuria, flank pain, vaginal bleeding and vaginal discharge.  Musculoskeletal: Negative for myalgias and arthralgias.  Skin: Negative for color change.  Allergic/Immunologic: Negative for immunocompromised state.  Neurological: Negative for weakness and numbness.  Psychiatric/Behavioral: Negative for confusion.   10 Systems reviewed and are negative for acute change except as noted in the HPI.    Allergies  Onion; Shellfish allergy; Amitriptyline; Amoxicillin-pot clavulanate; Cefuroxime; Cephalexin; Ciprofloxacin; Clarithromycin; Doxycycline; Propranolol; Shrimp flavor; Sulfonamide derivatives; and Benadryl  Home Medications   Prior to Admission medications   Medication Sig Start Date End Date Taking? Authorizing Provider  albuterol (PROVENTIL HFA;VENTOLIN HFA) 108 (90 BASE) MCG/ACT inhaler Inhale into the lungs every 6 (six) hours as needed for wheezing or shortness of breath.    Historical Provider, MD  ALPRAZolam Duanne Moron) 0.25 MG tablet Take 1 tablet (0.25 mg total) by mouth at bedtime as needed for anxiety. 05/24/15   Sid Falcon, MD  Ascorbic  Acid (VITAMIN C ER PO) Take by mouth.    Historical Provider, MD  benzonatate (TESSALON) 100 MG capsule Take 1 capsule (100 mg total) by mouth 3 (three) times daily as needed for cough. Patient not taking: Reported on 01/13/2015 01/12/15   Lutricia Feil, PA  estradiol (ESTRACE) 1 MG tablet Take 1 tablet (1 mg total) by mouth daily. 06/09/15   Osborne Oman, MD  fluticasone (FLOVENT HFA) 110 MCG/ACT inhaler Inhale into the lungs 2 (two) times daily.    Historical Provider, MD  ibuprofen (ADVIL,MOTRIN) 600 MG tablet Take 600 mg by mouth every 6 (six) hours as needed.    Historical Provider, MD  ipratropium (ATROVENT) 0.06 % nasal spray Place 2 sprays into both nostrils 4 (four) times daily. For nasal congestion 01/12/15   Lutricia Feil, PA  levothyroxine (SYNTHROID, LEVOTHROID) 25 MCG tablet TAKE 1 TABLET BY MOUTH DAILY 04/11/15   Sid Falcon, MD  methocarbamol (ROBAXIN) 500 MG tablet Take 2 tablets (1,000 mg total) by mouth 4 (four) times daily. Patient not taking: Reported on 06/09/2015 11/11/14   Carlisle Cater, PA-C  mometasone (NASONEX) 50 MCG/ACT nasal spray Place 2 sprays into the  nose daily.    Historical Provider, MD  naproxen (NAPROSYN) 375 MG tablet Take 1 tablet (375 mg total) by mouth 2 (two) times daily. Patient not taking: Reported on 06/09/2015 02/09/15   Margarita Mail, PA-C  oxyCODONE-acetaminophen (PERCOCET) 5-325 MG per tablet Take 1-2 tablets by mouth every 4 (four) hours as needed. Patient not taking: Reported on 06/09/2015 02/09/15   Margarita Mail, PA-C  SUMAtriptan (IMITREX) 100 MG tablet Take 100 mg by mouth every 2 (two) hours as needed for migraine or headache. May repeat in 2 hours if headache persists or recurs.    Historical Provider, MD   BP 142/83 mmHg  Pulse 94  Temp(Src) 98.8 F (37.1 C) (Oral)  Resp 16  Ht 5' (1.524 m)  Wt 208 lb (94.348 kg)  BMI 40.62 kg/m2  SpO2 98%  LMP 12/26/2004   Physical Exam  Constitutional: She is oriented to person,  place, and time. Vital signs are normal. She appears well-developed and well-nourished.  Non-toxic appearance. She appears distressed (uncomfortable).  Afebrile, nontoxic, appears uncomfortable  HENT:  Head: Normocephalic and atraumatic.  Mouth/Throat: Oropharynx is clear and moist and mucous membranes are normal.  Hearing impairment at baseline  Eyes: Conjunctivae and EOM are normal. Right eye exhibits no discharge. Left eye exhibits no discharge.  Neck: Normal range of motion. Neck supple.  Cardiovascular: Normal rate, regular rhythm, normal heart sounds and intact distal pulses.  Exam reveals no gallop and no friction rub.   No murmur heard. Pulmonary/Chest: Effort normal and breath sounds normal. No respiratory distress. She has no decreased breath sounds. She has no wheezes. She has no rhonchi. She has no rales.  Abdominal: Soft. Normal appearance and bowel sounds are normal. She exhibits no distension. There is tenderness in the right upper quadrant and right lower quadrant. There is tenderness at McBurney's point. There is no rigidity, no rebound, no guarding, no CVA tenderness and negative Murphy's sign.    Soft, nondistended, +BS throughout, with diffuse R sided abdominal TTP in RUQ and RLQ near mcburney's point, no r/g/r, neg murphy's, no CVA TTP   Musculoskeletal: Normal range of motion.  Neurological: She is alert and oriented to person, place, and time. She has normal strength. No sensory deficit.  Skin: Skin is warm, dry and intact. No rash noted.  Psychiatric: She has a normal mood and affect.  Nursing note and vitals reviewed.   ED Course  Procedures (including critical care time) Labs Review Labs Reviewed  COMPREHENSIVE METABOLIC PANEL - Abnormal; Notable for the following:    Potassium 3.3 (*)    CO2 20 (*)    All other components within normal limits  URINALYSIS, ROUTINE W REFLEX MICROSCOPIC (NOT AT [facility]) - Abnormal; Notable for the following:    Specific Gravity,  Urine >1.046 (*)    All other components within normal limits  CBC WITH DIFFERENTIAL/PLATELET - Abnormal; Notable for the following:    Basophils Absolute 0.2 (*)    All other components within normal limits  LIPASE, BLOOD  POC URINE PREG, ED    Imaging Review Ct Abdomen Pelvis W Contrast  06/12/2015   CLINICAL DATA:  Right side abdominal pain radiating into the right lower quadrant with nausea and vomiting since the night of 06/11/2015.  EXAM: CT ABDOMEN AND PELVIS WITH CONTRAST  TECHNIQUE: Multidetector CT imaging of the abdomen and pelvis was performed using the standard protocol following bolus administration of intravenous contrast.  CONTRAST:  100 mL OMNIPAQUE IOHEXOL 300 MG/ML  SOLN  COMPARISON:  CT abdomen and pelvis 09/29/2013.  FINDINGS: The lung bases are clear.  No pleural or pericardial effusion.  The patient is status post cholecystectomy. The liver, spleen, adrenal glands, pancreas and kidneys all appear normal.  The appendix is normal in appearance. The stomach and small and large bowel appear normal with a duodenal diverticulum noted. There is no lymphadenopathy or fluid. No bony abnormality identified.  IMPRESSION: Negative for appendicitis.  Negative exam.   Electronically Signed   By: Inge Rise M.D.   On: 06/12/2015 19:09   I have personally reviewed and evaluated these images and lab results as part of my medical decision-making.   EKG Interpretation None      MDM   Final diagnoses:  Right lower quadrant abdominal pain  Non-intractable vomiting with nausea, vomiting of unspecified type  Constipation, unspecified constipation type  Hypokalemia    44 y.o. female here with right lower quadrant abdominal pain and vomiting that began last night. On exam diffuse right-sided upper and lower abdominal tenderness, non-peritoneal, with good bowel sounds throughout. Appears uncomfortable. Will obtain labs and CT abd/pelvis to r/o appendicitis. No reproductive organs,  doubt need for pelvic exam. Will give pain and nausea meds, will reassess shortly.   9:30 PM U/A clear. CBC w/diff unremarkable. CMP with mildly low K at 3.3, doubt need for repletion. CT abd/pelvis with no signs of appendicitis or any other findings, some diverticula without evidence of diverticulitis. Moderate stool burden upon my review. Pt feeling much better, will d/c home with miralax, instructed to take twice daily until regular BMs achieved, then decrease and titrate to maintain daily soft stools. Discussed mag citrate option if she gets desperate to have a BM. Will send home with zofran, pt tolerating PO well here. F/up with PCP in 1wk. I explained the diagnosis and have given explicit precautions to return to the ER including for any other new or worsening symptoms. The patient understands and accepts the medical plan as it's been dictated and I have answered their questions. Discharge instructions concerning home care and prescriptions have been given. The patient is STABLE and is discharged to home in good condition.  BP 142/83 mmHg  Pulse 94  Temp(Src) 98.8 F (37.1 C) (Oral)  Resp 16  Ht 5' (1.524 m)  Wt 208 lb (94.348 kg)  BMI 40.62 kg/m2  SpO2 98%  LMP 12/26/2004  Meds ordered this encounter  Medications  . sodium chloride 0.9 % bolus 1,000 mL    Sig:   . morphine 4 MG/ML injection 4 mg    Sig:   . ondansetron (ZOFRAN) injection 4 mg    Sig:   . iohexol (OMNIPAQUE) 300 MG/ML solution 25 mL    Sig:   . iohexol (OMNIPAQUE) 300 MG/ML solution 100 mL    Sig:   . morphine 4 MG/ML injection 4 mg    Sig:   . ondansetron (ZOFRAN) 8 MG tablet    Sig: Take 1 tablet (8 mg total) by mouth every 8 (eight) hours as needed for nausea or vomiting.    Dispense:  10 tablet    Refill:  0    Order Specific Question:  Supervising Provider    Answer:  Sabra Heck, BRIAN [[id number]]  . polyethylene glycol (MIRALAX / GLYCOLAX) packet    Sig: Take 17 g by mouth 2 (two) times daily. Take 17g PO BID  until you have daily soft stools, then reduce to once daily or every other day to maintain daily soft  stools    Dispense:  14 each    Refill:  0    Order Specific Question:  Supervising Provider    Answer:  Noemi Chapel [[id number]]     Mercedes Camprubi-Soms, PA-C 06/12/15 2144  Harvel Quale, MD 06/18/15 1558

## 2015-06-12 NOTE — Discharge Instructions (Signed)
Your abdominal pain is likely related to constipation. Use miralax as directed, using it twice daily until you have daily soft stools, then reduce to once daily or every other day to maintain daily soft stools. You may consider using magnesium citrate if you want to stimulate stool production, but this might make you have cramping. Use zofran as directed as needed for nausea/vomiting. Stay well hydrated with plenty of water. Increase your fiber intake in your diet. Follow up with your regular doctor in 1 week for recheck of symptoms. Return to the ER for changes or worsening symptoms.  Abdominal (belly) pain can be caused by many things. Your caregiver performed an examination and possibly ordered blood/urine tests and imaging (CT scan, x-rays, ultrasound). Many cases can be observed and treated at home after initial evaluation in the emergency department. Even though you are being discharged home, abdominal pain can be unpredictable. Therefore, you need a repeated exam if your pain does not resolve, returns, or worsens. Most patients with abdominal pain don't have to be admitted to the hospital or have surgery, but serious problems like appendicitis and gallbladder attacks can start out as nonspecific pain. Many abdominal conditions cannot be diagnosed in one visit, so follow-up evaluations are very important. SEEK IMMEDIATE MEDICAL ATTENTION IF YOU DEVELOP ANY OF THE FOLLOWING SYMPTOMS:  The pain does not go away or becomes severe.   A temperature above 101 develops.   Repeated vomiting occurs (multiple episodes).   The pain becomes localized to portions of the abdomen. The right side could possibly be appendicitis. In an adult, the left lower portion of the abdomen could be colitis or diverticulitis.   Blood is being passed in stools or vomit (bright red or black tarry stools).   Return also if you develop chest pain, difficulty breathing, dizziness or fainting, or become confused, poorly  responsive, or inconsolable (young children).  The constipation stays for more than 4 days.   There is belly (abdominal) or rectal pain.   You do not seem to be getting better.     Abdominal Pain Many things can cause belly (abdominal) pain. Most times, the belly pain is not dangerous. Many cases of belly pain can be watched and treated at home. HOME CARE   Do not take medicines that help you go poop (laxatives) unless told to by your doctor.  Only take medicine as told by your doctor.  Eat or drink as told by your doctor. Your doctor will tell you if you should be on a special diet. GET HELP IF:  You do not know what is causing your belly pain.  You have belly pain while you are sick to your stomach (nauseous) or have runny poop (diarrhea).  You have pain while you pee or poop.  Your belly pain wakes you up at night.  You have belly pain that gets worse or better when you eat.  You have belly pain that gets worse when you eat fatty foods.  You have a fever. GET HELP RIGHT AWAY IF:   The pain does not go away within 2 hours.  You keep throwing up (vomiting).  The pain changes and is only in the right or left part of the belly.  You have bloody or tarry looking poop. MAKE SURE YOU:   Understand these instructions.  Will watch your condition.  Will get help right away if you are not doing well or get worse. Document Released: 02/20/2008 Document Revised: 09/08/2013 Document Reviewed: 05/13/2013 [employer]  Patient Information 2015 [place]. This information is not intended to replace advice given to you by your health care provider. Make sure you discuss any questions you have with your health care provider.  Constipation Constipation is when a person:  Poops (has a bowel movement) less than 3 times a week.  Has a hard time pooping.  Has poop that is dry, hard, or bigger than normal. HOME CARE   Eat foods with a lot of fiber in them. This includes  fruits, vegetables, beans, and whole grains such as brown rice.  Avoid fatty foods and foods with a lot of sugar. This includes french fries, hamburgers, cookies, candy, and soda.  If you are not getting enough fiber from food, take products with added fiber in them (supplements).  Drink enough fluid to keep your pee (urine) clear or pale yellow.  Exercise on a regular basis, or as told by your doctor.  Go to the restroom when you feel like you need to poop. Do not hold it.  Only take medicine as told by your doctor. Do not take medicines that help you poop (laxatives) without talking to your doctor first. GET HELP RIGHT AWAY IF:   You have bright red blood in your poop (stool).  Your constipation lasts more than 4 days or gets worse.  You have belly (abdominal) or butt (rectal) pain.  You have thin poop (as thin as a pencil).  You lose weight, and it cannot be explained. MAKE SURE YOU:   Understand these instructions.  Will watch your condition.  Will get help right away if you are not doing well or get worse. Document Released: 02/20/2008 Document Revised: 09/08/2013 Document Reviewed: 06/15/2013 [facility] Patient Information 2015 [place], [place]. This information is not intended to replace advice given to you by your health care provider. Make sure you discuss any questions you have with your health care provider.  Nausea and Vomiting Nausea is a sick feeling that often comes before throwing up (vomiting). Vomiting is a reflex where stomach contents come out of your mouth. Vomiting can cause severe loss of body fluids (dehydration). Children and elderly adults can become dehydrated quickly, especially if they also have diarrhea. Nausea and vomiting are symptoms of a condition or disease. It is important to find the cause of your symptoms. CAUSES   Direct irritation of the stomach lining. This irritation can result from increased acid production (gastroesophageal reflux  disease), infection, food poisoning, taking certain medicines (such as nonsteroidal anti-inflammatory drugs), alcohol use, or tobacco use.  Signals from the brain.These signals could be caused by a headache, heat exposure, an inner ear disturbance, increased pressure in the brain from injury, infection, a tumor, or a concussion, pain, emotional stimulus, or metabolic problems.  An obstruction in the gastrointestinal tract (bowel obstruction).  Illnesses such as diabetes, hepatitis, gallbladder problems, appendicitis, kidney problems, cancer, sepsis, atypical symptoms of a heart attack, or eating disorders.  Medical treatments such as chemotherapy and radiation.  Receiving medicine that makes you sleep (general anesthetic) during surgery. DIAGNOSIS Your caregiver may ask for tests to be done if the problems do not improve after a few days. Tests may also be done if symptoms are severe or if the reason for the nausea and vomiting is not clear. Tests may include:  Urine tests.  Blood tests.  Stool tests.  Cultures (to look for evidence of infection).  X-rays or other imaging studies. Test results can help your caregiver make decisions about treatment or  the need for additional tests. TREATMENT You need to stay well hydrated. Drink frequently but in small amounts.You may wish to drink water, sports drinks, clear broth, or eat frozen ice pops or gelatin dessert to help stay hydrated.When you eat, eating slowly may help prevent nausea.There are also some antinausea medicines that may help prevent nausea. HOME CARE INSTRUCTIONS   Take all medicine as directed by your caregiver.  If you do not have an appetite, do not force yourself to eat. However, you must continue to drink fluids.  If you have an appetite, eat a normal diet unless your caregiver tells you differently.  Eat a variety of complex carbohydrates (rice, wheat, potatoes, bread), lean meats, yogurt, fruits, and  vegetables.  Avoid high-fat foods because they are more difficult to digest.  Drink enough water and fluids to keep your urine clear or pale yellow.  If you are dehydrated, ask your caregiver for specific rehydration instructions. Signs of dehydration may include:  Severe thirst.  Dry lips and mouth.  Dizziness.  Dark urine.  Decreasing urine frequency and amount.  Confusion.  Rapid breathing or pulse. SEEK IMMEDIATE MEDICAL CARE IF:   You have blood or brown flecks (like coffee grounds) in your vomit.  You have black or bloody stools.  You have a severe headache or stiff neck.  You are confused.  You have severe abdominal pain.  You have chest pain or trouble breathing.  You do not urinate at least once every 8 hours.  You develop cold or clammy skin.  You continue to vomit for longer than 24 to 48 hours.  You have a fever. MAKE SURE YOU:   Understand these instructions.  Will watch your condition.  Will get help right away if you are not doing well or get worse. Document Released: 09/03/2005 Document Revised: 11/26/2011 Document Reviewed: 01/31/2011 [facility] Patient Information 2015 [place], [place]. This information is not intended to replace advice given to you by your health care provider. Make sure you discuss any questions you have with your health care provider.  Hypokalemia Hypokalemia means that the amount of potassium in the blood is lower than normal.Potassium is a chemical, called an electrolyte, that helps regulate the amount of fluid in the body. It also stimulates muscle contraction and helps nerves function properly.Most of the body's potassium is inside of cells, and only a very small amount is in the blood. Because the amount in the blood is so small, minor changes can be life-threatening. CAUSES  Antibiotics.  Diarrhea or vomiting.  Using laxatives too much, which can cause diarrhea.  Chronic kidney disease.  Water pills  (diuretics).  Eating disorders (bulimia).  Low magnesium level.  Sweating a lot. SIGNS AND SYMPTOMS  Weakness.  Constipation.  Fatigue.  Muscle cramps.  Mental confusion.  Skipped heartbeats or irregular heartbeat (palpitations).  Tingling or numbness. DIAGNOSIS  Your health care provider can diagnose hypokalemia with blood tests. In addition to checking your potassium level, your health care provider may also check other lab tests. TREATMENT Hypokalemia can be treated with potassium supplements taken by mouth or adjustments in your current medicines. If your potassium level is very low, you may need to get potassium through a vein (IV) and be monitored in the hospital. A diet high in potassium is also helpful. Foods high in potassium are:  Nuts, such as peanuts and pistachios.  Seeds, such as sunflower seeds and pumpkin seeds.  Peas, lentils, and lima beans.  Whole grain and bran cereals  and breads.  Fresh fruit and vegetables, such as apricots, avocado, bananas, cantaloupe, kiwi, oranges, tomatoes, asparagus, and potatoes.  Orange and tomato juices.  Red meats.  Fruit yogurt. HOME CARE INSTRUCTIONS  Take all medicines as prescribed by your health care provider.  Maintain a healthy diet by including nutritious food, such as fruits, vegetables, nuts, whole grains, and lean meats.  If you are taking a laxative, be sure to follow the directions on the label. SEEK MEDICAL CARE IF:  Your weakness gets worse.  You feel your heart pounding or racing.  You are vomiting or having diarrhea.  You are diabetic and having trouble keeping your blood glucose in the normal range. SEEK IMMEDIATE MEDICAL CARE IF:  You have chest pain, shortness of breath, or dizziness.  You are vomiting or having diarrhea for more than 2 days.  You faint. MAKE SURE YOU:   Understand these instructions.  Will watch your condition.  Will get help right away if you are not doing  well or get worse. Document Released: 09/03/2005 Document Revised: 06/24/2013 Document Reviewed: 03/06/2013 [facility] Patient Information 2015 [place], [place]. This information is not intended to replace advice given to you by your health care provider. Make sure you discuss any questions you have with your health care provider.  Potassium Content of Foods Potassium is a mineral found in many foods and drinks. It helps keep fluids and minerals balanced in your body and affects how steadily your heart beats. Potassium also helps control your blood pressure and keep your muscles and nervous system healthy. Certain health conditions and medicines may change the balance of potassium in your body. When this happens, you can help balance your level of potassium through the foods that you do or do not eat. Your health care provider or dietitian may recommend an amount of potassium that you should have each day. The following lists of foods provide the amount of potassium (in parentheses) per serving in each item. HIGH IN POTASSIUM  The following foods and beverages have 200 mg or more of potassium per serving:  Apricots, 2 raw or 5 dry (200 mg).  Artichoke, 1 medium (345 mg).  Avocado, raw,  each (245 mg).  Banana, 1 medium (425 mg).  Beans, lima, or baked beans, canned,  cup (280 mg).  Beans, white, canned,  cup (595 mg).  Beef roast, 3 oz (320 mg).  Beef, ground, 3 oz (270 mg).  Beets, raw or cooked,  cup (260 mg).  Bran muffin, 2 oz (300 mg).  Broccoli,  cup (230 mg).  Brussels sprouts,  cup (250 mg).  Cantaloupe,  cup (215 mg).  Cereal, 100% bran,  cup (200-400 mg).  Cheeseburger, single, fast food, 1 each (225-400 mg).  Chicken, 3 oz (220 mg).  Clams, canned, 3 oz (535 mg).  Crab, 3 oz (225 mg).  Dates, 5 each (270 mg).  Dried beans and peas,  cup (300-475 mg).  Figs, dried, 2 each (260 mg).  Fish: halibut, tuna, cod, snapper, 3 oz (480 mg).  Fish: salmon,  haddock, swordfish, perch, 3 oz (300 mg).  Fish, tuna, canned 3 oz (200 mg).  [place] fries, fast food, 3 oz (470 mg).  Granola with fruit and nuts,  cup (200 mg).  Grapefruit juice,  cup (200 mg).  Greens, beet,  cup (655 mg).  Honeydew melon,  cup (200 mg).  Kale, raw, 1 cup (300 mg).  Kiwi, 1 medium (240 mg).  Kohlrabi, rutabaga, parsnips,  cup (280 mg).  Lentils,  cup (365 mg).  Mango, 1 each (325 mg).  Milk, chocolate, 1 cup (420 mg).  Milk: nonfat, low-fat, whole, buttermilk, 1 cup (350-380 mg).  Molasses, 1 Tbsp (295 mg).  Mushrooms,  cup (280) mg.  Nectarine, 1 each (275 mg).  Nuts: almonds, peanuts, hazelnuts, [place], cashew, mixed, 1 oz (200 mg).  Nuts, pistachios, 1 oz (295 mg).  Orange, 1 each (240 mg).  Orange juice,  cup (235 mg).  Papaya, medium,  fruit (390 mg).  Peanut butter, chunky, 2 Tbsp (240 mg).  Peanut butter, smooth, 2 Tbsp (210 mg).  Pear, 1 medium (200 mg).  Pomegranate, 1 whole (400 mg).  Pomegranate juice,  cup (215 mg).  Pork, 3 oz (350 mg).  Potato chips, salted, 1 oz (465 mg).  Potato, baked with skin, 1 medium (925 mg).  Potatoes, boiled,  cup (255 mg).  Potatoes, mashed,  cup (330 mg).  Prune juice,  cup (370 mg).  Prunes, 5 each (305 mg).  Pudding, chocolate,  cup (230 mg).  Pumpkin, canned,  cup (250 mg).  Raisins, seedless,  cup (270 mg).  Seeds, sunflower or pumpkin, 1 oz (240 mg).  Soy milk, 1 cup (300 mg).  Spinach,  cup (420 mg).  Spinach, canned,  cup (370 mg).  Sweet potato, baked with skin, 1 medium (450 mg).  Swiss chard,  cup (480 mg).  Tomato or vegetable juice,  cup (275 mg).  Tomato sauce or puree,  cup (400-550 mg).  Tomato, raw, 1 medium (290 mg).  Tomatoes, canned,  cup (200-300 mg).  [place], 3 oz (250 mg).  Wheat germ, 1 oz (250 mg).  Winter squash,  cup (250 mg).  Yogurt, plain or fruited, 6 oz (260-435 mg).  Zucchini,  cup (220  mg). MODERATE IN POTASSIUM The following foods and beverages have 50-200 mg of potassium per serving:  Apple, 1 each (150 mg).  Apple juice,  cup (150 mg).  Applesauce,  cup (90 mg).  Apricot nectar,  cup (140 mg).  Asparagus, small spears,  cup or 6 spears (155 mg).  Bagel, cinnamon raisin, 1 each (130 mg).  Bagel, egg or plain, 4 in., 1 each (70 mg).  Beans, green,  cup (90 mg).  Beans, yellow,  cup (190 mg).  Beer, regular, 12 oz (100 mg).  Beets, canned,  cup (125 mg).  Blackberries,  cup (115 mg).  Blueberries,  cup (60 mg).  Bread, whole wheat, 1 slice (70 mg).  Broccoli, raw,  cup (145 mg).  Cabbage,  cup (150 mg).  Carrots, cooked or raw,  cup (180 mg).  Cauliflower, raw,  cup (150 mg).  Celery, raw,  cup (155 mg).  Cereal, bran flakes, cup (120-150 mg).  Cheese, cottage,  cup (110 mg).  Cherries, 10 each (150 mg).  Chocolate, 1 oz bar (165 mg).  Coffee, brewed 6 oz (90 mg).  Corn,  cup or 1 ear (195 mg).  Cucumbers,  cup (80 mg).  Egg, large, 1 each (60 mg).  Eggplant,  cup (60 mg).  Endive, raw, cup (80 mg).  English muffin, 1 each (65 mg).  Fish, orange roughy, 3 oz (150 mg).  Frankfurter, beef or pork, 1 each (75 mg).  Fruit cocktail,  cup (115 mg).  Grape juice,  cup (170 mg).  Grapefruit,  fruit (175 mg).  Grapes,  cup (155 mg).  Greens: kale, turnip, collard,  cup (110-150 mg).  Ice cream or frozen yogurt, chocolate,  cup (175 mg).  Ice  cream or frozen yogurt, vanilla,  cup (120-150 mg).  Lemons, limes, 1 each (80 mg).  Lettuce, all types, 1 cup (100 mg).  Mixed vegetables,  cup (150 mg).  Mushrooms, raw,  cup (110 mg).  Nuts: walnuts, pecans, or macadamia, 1 oz (125 mg).  Oatmeal,  cup (80 mg).  Okra,  cup (110 mg).  Onions, raw,  cup (120 mg).  Peach, 1 each (185 mg).  Peaches, canned,  cup (120 mg).  Pears, canned,  cup (120 mg).  Peas, green, frozen,  cup (90  mg).  Peppers, green,  cup (130 mg).  Peppers, red,  cup (160 mg).  Pineapple juice,  cup (165 mg).  Pineapple, fresh or canned,  cup (100 mg).  Plums, 1 each (105 mg).  Pudding, vanilla,  cup (150 mg).  Raspberries,  cup (90 mg).  Rhubarb,  cup (115 mg).  Rice, wild,  cup (80 mg).  Shrimp, 3 oz (155 mg).  Spinach, raw, 1 cup (170 mg).  Strawberries,  cup (125 mg).  Summer squash  cup (175-200 mg).  Swiss chard, raw, 1 cup (135 mg).  Tangerines, 1 each (140 mg).  Tea, brewed, 6 oz (65 mg).  Turnips,  cup (140 mg).  Watermelon,  cup (85 mg).  Wine, red, table, 5 oz (180 mg).  Wine, white, table, 5 oz (100 mg). LOW IN POTASSIUM The following foods and beverages have less than 50 mg of potassium per serving.  Bread, white, 1 slice (30 mg).  Carbonated beverages, 12 oz (less than 5 mg).  Cheese, 1 oz (20-30 mg).  Cranberries,  cup (45 mg).  Cranberry juice cocktail,  cup (20 mg).  Fats and oils, 1 Tbsp (less than 5 mg).  Hummus, 1 Tbsp (32 mg).  Nectar: papaya, mango, or pear,  cup (35 mg).  Rice, white or brown,  cup (50 mg).  Spaghetti or macaroni,  cup cooked (30 mg).  Tortilla, flour or corn, 1 each (50 mg).  Waffle, 4 in., 1 each (50 mg).  Water chestnuts,  cup (40 mg). Document Released: 04/17/2005 Document Revised: 09/08/2013 Document Reviewed: 07/31/2013 [facility] Patient Information 2015 [place], [place]. This information is not intended to replace advice given to you by your health care provider. Make sure you discuss any questions you have with your health care provider.

## 2015-06-12 NOTE — ED Notes (Signed)
Pt & family aware that a sign language interpretor will be arriving at 19:00

## 2015-06-22 DIAGNOSIS — M5032 Other cervical disc degeneration, mid-cervical region, unspecified level: Secondary | ICD-10-CM | POA: Diagnosis not present

## 2015-06-22 DIAGNOSIS — G44209 Tension-type headache, unspecified, not intractable: Secondary | ICD-10-CM | POA: Diagnosis not present

## 2015-06-22 DIAGNOSIS — M5031 Other cervical disc degeneration,  high cervical region: Secondary | ICD-10-CM | POA: Diagnosis not present

## 2015-06-22 DIAGNOSIS — M9901 Segmental and somatic dysfunction of cervical region: Secondary | ICD-10-CM | POA: Diagnosis not present

## 2015-07-04 DIAGNOSIS — M67461 Ganglion, right knee: Secondary | ICD-10-CM | POA: Diagnosis not present

## 2015-07-04 DIAGNOSIS — S8391XD Sprain of unspecified site of right knee, subsequent encounter: Secondary | ICD-10-CM | POA: Diagnosis not present

## 2015-07-04 DIAGNOSIS — M25561 Pain in right knee: Secondary | ICD-10-CM | POA: Diagnosis not present

## 2015-07-04 DIAGNOSIS — M238X1 Other internal derangements of right knee: Secondary | ICD-10-CM | POA: Diagnosis not present

## 2015-07-12 DIAGNOSIS — M659 Synovitis and tenosynovitis, unspecified: Secondary | ICD-10-CM | POA: Diagnosis not present

## 2015-07-12 DIAGNOSIS — M6751 Plica syndrome, right knee: Secondary | ICD-10-CM | POA: Diagnosis not present

## 2015-07-12 DIAGNOSIS — G8918 Other acute postprocedural pain: Secondary | ICD-10-CM | POA: Diagnosis not present

## 2015-07-20 DIAGNOSIS — G44209 Tension-type headache, unspecified, not intractable: Secondary | ICD-10-CM | POA: Diagnosis not present

## 2015-07-20 DIAGNOSIS — M5032 Other cervical disc degeneration, mid-cervical region, unspecified level: Secondary | ICD-10-CM | POA: Diagnosis not present

## 2015-07-20 DIAGNOSIS — M9901 Segmental and somatic dysfunction of cervical region: Secondary | ICD-10-CM | POA: Diagnosis not present

## 2015-07-20 DIAGNOSIS — M5031 Other cervical disc degeneration,  high cervical region: Secondary | ICD-10-CM | POA: Diagnosis not present

## 2015-07-21 DIAGNOSIS — Z4789 Encounter for other orthopedic aftercare: Secondary | ICD-10-CM | POA: Diagnosis not present

## 2015-07-25 ENCOUNTER — Other Ambulatory Visit: Payer: Self-pay | Admitting: Internal Medicine

## 2015-07-27 NOTE — Telephone Encounter (Signed)
Have not seen in well over a year.  Will refill X 1 month and then she will need to make an appointment either with me or with resident in clinic for further refills.   Please call and have her make an appointment.

## 2015-07-28 NOTE — Telephone Encounter (Signed)
Rx called in, sent msg to front desk pool to schedule pt with you or resident.

## 2015-07-29 DIAGNOSIS — M6751 Plica syndrome, right knee: Secondary | ICD-10-CM | POA: Diagnosis not present

## 2015-08-03 DIAGNOSIS — M5031 Other cervical disc degeneration,  high cervical region: Secondary | ICD-10-CM | POA: Diagnosis not present

## 2015-08-03 DIAGNOSIS — M9901 Segmental and somatic dysfunction of cervical region: Secondary | ICD-10-CM | POA: Diagnosis not present

## 2015-08-03 DIAGNOSIS — M5032 Other cervical disc degeneration, mid-cervical region, unspecified level: Secondary | ICD-10-CM | POA: Diagnosis not present

## 2015-08-03 DIAGNOSIS — G44209 Tension-type headache, unspecified, not intractable: Secondary | ICD-10-CM | POA: Diagnosis not present

## 2015-08-05 DIAGNOSIS — G44209 Tension-type headache, unspecified, not intractable: Secondary | ICD-10-CM | POA: Diagnosis not present

## 2015-08-05 DIAGNOSIS — M5031 Other cervical disc degeneration,  high cervical region: Secondary | ICD-10-CM | POA: Diagnosis not present

## 2015-08-05 DIAGNOSIS — M9901 Segmental and somatic dysfunction of cervical region: Secondary | ICD-10-CM | POA: Diagnosis not present

## 2015-08-05 DIAGNOSIS — M5032 Other cervical disc degeneration, mid-cervical region, unspecified level: Secondary | ICD-10-CM | POA: Diagnosis not present

## 2015-08-19 DIAGNOSIS — M6751 Plica syndrome, right knee: Secondary | ICD-10-CM | POA: Diagnosis not present

## 2015-08-22 ENCOUNTER — Other Ambulatory Visit: Payer: Self-pay | Admitting: Internal Medicine

## 2015-08-22 ENCOUNTER — Encounter: Payer: Self-pay | Admitting: Internal Medicine

## 2015-08-22 DIAGNOSIS — Z4789 Encounter for other orthopedic aftercare: Secondary | ICD-10-CM | POA: Diagnosis not present

## 2015-08-22 MED ORDER — ALPRAZOLAM 0.25 MG PO TABS: 0.2500 mg | ORAL_TABLET | Freq: Every evening | ORAL | Status: DC | PRN

## 2015-08-22 NOTE — Telephone Encounter (Signed)
Called to pharm, added note must keep appt for future refills

## 2015-08-22 NOTE — Telephone Encounter (Signed)
Will send in, no refills.  If she does not come to appointment, cannot refill further and will start weaning.  Have not seen patient in > 1 year.

## 2015-08-24 ENCOUNTER — Encounter: Payer: Medicare Other | Admitting: Internal Medicine

## 2015-08-26 ENCOUNTER — Other Ambulatory Visit: Payer: Self-pay | Admitting: Internal Medicine

## 2015-08-26 ENCOUNTER — Encounter: Payer: Self-pay | Admitting: Internal Medicine

## 2015-08-26 ENCOUNTER — Ambulatory Visit (INDEPENDENT_AMBULATORY_CARE_PROVIDER_SITE_OTHER): Payer: Medicare Other | Admitting: Internal Medicine

## 2015-08-26 VITALS — BP 138/85 | HR 90 | Temp 98.3°F | Ht 61.0 in | Wt 214.1 lb

## 2015-08-26 DIAGNOSIS — Z803 Family history of malignant neoplasm of breast: Secondary | ICD-10-CM

## 2015-08-26 DIAGNOSIS — Z0000 Encounter for general adult medical examination without abnormal findings: Secondary | ICD-10-CM

## 2015-08-26 DIAGNOSIS — X58XXXA Exposure to other specified factors, initial encounter: Secondary | ICD-10-CM | POA: Diagnosis not present

## 2015-08-26 DIAGNOSIS — N6489 Other specified disorders of breast: Secondary | ICD-10-CM | POA: Insufficient documentation

## 2015-08-26 DIAGNOSIS — N644 Mastodynia: Secondary | ICD-10-CM

## 2015-08-26 DIAGNOSIS — S2000XA Contusion of breast, unspecified breast, initial encounter: Secondary | ICD-10-CM

## 2015-08-26 DIAGNOSIS — Z87891 Personal history of nicotine dependence: Secondary | ICD-10-CM

## 2015-08-26 DIAGNOSIS — S2001XA Contusion of right breast, initial encounter: Secondary | ICD-10-CM

## 2015-08-26 DIAGNOSIS — M6751 Plica syndrome, right knee: Secondary | ICD-10-CM | POA: Diagnosis not present

## 2015-08-26 HISTORY — DX: Family history of malignant neoplasm of breast: Z80.3

## 2015-08-26 MED ORDER — TRAMADOL HCL 50 MG PO TABS
50.0000 mg | ORAL_TABLET | Freq: Four times a day (QID) | ORAL | Status: DC | PRN
Start: 2015-08-26 — End: 2015-09-07

## 2015-08-26 MED ORDER — ACETAMINOPHEN 500 MG PO TABS: 1000.0000 mg | ORAL_TABLET | Freq: Three times a day (TID) | ORAL | Status: AC | PRN

## 2015-08-26 NOTE — Assessment & Plan Note (Signed)
Her mother was diagnosed with breast cancer in her 30s. No other family members have been diagnosed with breast nor ovarian cancer. She is unaware of any genetic testing

## 2015-08-26 NOTE — Progress Notes (Signed)
Internal Medicine Clinic Attending  I saw and evaluated the patient.  I personally confirmed the key portions of the history and exam documented by Dr. Flores and I reviewed pertinent patient test results.  The assessment, diagnosis, and plan were formulated together and I agree with the documentation in the resident's note.  

## 2015-08-26 NOTE — Assessment & Plan Note (Signed)
The combination of her breast pain arising after using crutches along with the exam findings of a simple bruise without an underlying mass makes me believe this is most likely traumatic injury with possible underlying fat necrosis, and less likely breast cancer. However, her mother was diagnosed with breast cancer in her 40s, so we will get a mammogram. Her last mammogram 2 years ago was normal. For pain, I have prescribed Tylenol; she reports hematochezia when taking NSAIDs, so I have advised her to avoid these.

## 2015-08-26 NOTE — Assessment & Plan Note (Signed)
Per prior note, her last mammogram was 2 years ago. She is 44 years old, so annual mammograms are recommended. I placed referral for mammogram to evaluate her breast bruise and keep her up-to-date on screening.

## 2015-08-26 NOTE — Progress Notes (Signed)
Patient ID: Donna Davis, female   DOB: August 13, 1971, 44 y.o.   MRN: [id number]   Subjective:   Patient ID: Donna Davis female   DOB: 17-Jun-1971 44 y.o.   MRN: [id number]  HPI: [name] is a 44 y.o. deaf lady with a history of hypothyroidism, anxiety, and numerous abdominal surgeries presenting with a painful right breast.  In mid October, she had a knee surgery and use crutches until early November. About a week after she stopped using crutches, she began to notice pain in her right breast. She first noticed a dime-sized bruise, that has enlarged to about the size of a half-dollar. She thinks she feels a mass under there as well. She denies any bumps in her axilla, nipple discharge, nipple retraction. Notably, her mother was diagnosed with breast cancer in her 54s. There is no other family history of breast nor ovarian cancer.  Please see the assessment and plan for the status of the patient's chronic medical problems.   Past Medical History  Diagnosis Date  . GERD (gastroesophageal reflux disease)   . Hypothyroidism   . IBS (irritable bowel syndrome)   . Anxiety   . Hearing difficulty     b/l, 2/2 congenital rubella s/p stapedectomy. Using hearing aid  . Headache(784.0) 04/2005    h/o headache in past that raised question of psuedotumor s/p therapeutic LP with an opening preassure of 18 cm H2O   . Hepatic cyst     6 mm on CT done done in 05/2005  . Family history of malignant neoplasm of gastrointestinal tract   . Shingles 03/2013   Current Outpatient Prescriptions  Medication Sig Dispense Refill  . albuterol (PROVENTIL HFA;VENTOLIN HFA) 108 (90 BASE) MCG/ACT inhaler Inhale into the lungs every 6 (six) hours as needed for wheezing or shortness of breath.    . ALPRAZolam (XANAX) 0.25 MG tablet Take 1 tablet (0.25 mg total) by mouth at bedtime as needed. 30 tablet 0  . Ascorbic Acid (VITAMIN C PO) Take 1 tablet by mouth daily.    . benzonatate (TESSALON) 100 MG capsule Take 1 capsule  (100 mg total) by mouth 3 (three) times daily as needed for cough. (Patient not taking: Reported on 01/13/2015) 21 capsule 0  . Cholecalciferol (VITAMIN D PO) Take 1 tablet by mouth daily.    Marland Kitchen estradiol (ESTRACE) 1 MG tablet Take 1 tablet (1 mg total) by mouth daily. 30 tablet 10  . ipratropium (ATROVENT) 0.06 % nasal spray Place 2 sprays into both nostrils 4 (four) times daily. For nasal congestion 15 mL 0  . levothyroxine (SYNTHROID, LEVOTHROID) 25 MCG tablet TAKE 1 TABLET BY MOUTH DAILY 31 tablet 11  . methocarbamol (ROBAXIN) 500 MG tablet Take 2 tablets (1,000 mg total) by mouth 4 (four) times daily. (Patient not taking: Reported on 06/09/2015) 20 tablet 0  . naproxen (NAPROSYN) 375 MG tablet Take 1 tablet (375 mg total) by mouth 2 (two) times daily. (Patient not taking: Reported on 06/09/2015) 20 tablet 0  . ondansetron (ZOFRAN) 8 MG tablet Take 1 tablet (8 mg total) by mouth every 8 (eight) hours as needed for nausea or vomiting. 10 tablet 0  . OVER THE COUNTER MEDICATION Take 1 tablet by mouth daily. SlimQuick    . oxyCODONE-acetaminophen (PERCOCET) 5-325 MG per tablet Take 1-2 tablets by mouth every 4 (four) hours as needed. (Patient not taking: Reported on 06/09/2015) 20 tablet 0  . polyethylene glycol (MIRALAX / GLYCOLAX) packet Take 17 g by mouth  2 (two) times daily. Take 17g PO BID until you have daily soft stools, then reduce to once daily or every other day to maintain daily soft stools 14 each 0  . promethazine (PHENERGAN) 25 MG tablet Take 25 mg by mouth every 6 (six) hours as needed for nausea or vomiting.     No current facility-administered medications for this visit.   Family History  Problem Relation Age of Onset  . Breast cancer Mother   . Ovarian cancer Mother   . Diabetes Mother   . Stomach cancer Maternal Grandmother   . Colon cancer Maternal Grandmother   . Diabetes Maternal Grandmother   . Pancreatic cancer Father   . Prostate cancer Father   . Colon cancer Father     . Diabetes Father   . Heart disease Father   . Irritable bowel syndrome Father   . Kidney disease Father   . Heart disease      both grandmothers  . Bipolar disorder Daughter    Social History   Social History  . Marital Status: Married    Spouse Name: N/A  . Number of Children: 2  . Years of Education: N/A   Occupational History  . student    Social History Main Topics  . Smoking status: Former Smoker    Types: Cigarettes    Quit date: 02/18/2008  . Smokeless tobacco: Never Used     Comment: quit 49yrs ago  . Alcohol Use: 0.0 oz/week    0 Standard drinks or equivalent per week     Comment: Rarely.  . Drug Use: No  . Sexual Activity: Yes    Birth Control/ Protection: Surgical   Other Topics Concern  . None   Social History Narrative   Current smoker, not using alcohol or drugs at this time   Lives with husband and 2 children   Review of Systems  Constitutional: Positive for malaise/fatigue. Negative for fever and chills.  Musculoskeletal: Negative for myalgias.       Right breast pain  Skin: Negative for itching and rash.   Objective:  Physical Exam: Filed Vitals:   08/26/15 1038  BP: 138/85  Pulse: 90  Temp: 98.3 F (36.8 C)  TempSrc: Oral  Height: 5\' 1"  (1.549 m)  Weight: 214 lb 1.6 oz (97.115 kg)  SpO2: 99%   General: resting in chair comfortably, appropriately conversational HEENT: no scleral icterus, extra-ocular muscles intact Cardiac: regular rate and rhythm, no rubs, murmurs or gallops Pulm: breathing well, clear to auscultation bilaterally Abd: bowel sounds normal, soft, nondistended, non-tender Ext: warm and well perfused, without pedal edema Lymph: no cervical or supraclavicular lymphadenopathy Breast: 5cm from the border of the areola at 8 o'clock, there is a 4cm round ecchymosis, tender to the touch, without any underlying masses. There are no other appreciable masses in either of the other breasts, no axillary lymphadnopathy, no nipple  discharge Skin: no rash, hair, or nail changes Neuro: alert and oriented X3, cranial nerves II-XII grossly intact, moving all extremities well  Assessment & Plan:   Please see problem-based charting for my assessment and plan.

## 2015-08-26 NOTE — Patient Instructions (Addendum)
Mrs. Donna Davis,  It was a pleasure to meet you today. I'm sorry you've been in so much pain.  It appears you have a large, painful bruise on your right breast. I didn't feel any masses in the area that would suggest cancer. However, since you have a family history of breast cancer, I think we should be safe rather than sorry, and go ahead and get a mammogram done.  I have prescribed you high-dose Tylenol. This won't affect your gastrointestinal bleeding. If her pain continues to get worse, and/or your bruise continues to enlarge, don't hesitate to make another appointment with [place]. It doesn't look infected to warrant antibiotics right now, but it may become superinfected in the future.  Take care, have a most Merry Christmas, and let us know if there is anything else we can do for you. Dr. Melburn Hake

## 2015-08-30 ENCOUNTER — Encounter: Payer: Self-pay | Admitting: Internal Medicine

## 2015-08-31 ENCOUNTER — Ambulatory Visit
Admission: RE | Admit: 2015-08-31 | Discharge: 2015-08-31 | Disposition: A | Discharge status: Home / Self Care | Payer: Medicare Other | Source: Ambulatory Visit | Attending: Student in an Organized Health Care Education/Training Program | Admitting: Student in an Organized Health Care Education/Training Program

## 2015-08-31 DIAGNOSIS — Z0000 Encounter for general adult medical examination without abnormal findings: Secondary | ICD-10-CM

## 2015-08-31 DIAGNOSIS — M9901 Segmental and somatic dysfunction of cervical region: Secondary | ICD-10-CM | POA: Diagnosis not present

## 2015-08-31 DIAGNOSIS — Z803 Family history of malignant neoplasm of breast: Secondary | ICD-10-CM

## 2015-08-31 DIAGNOSIS — N6011 Diffuse cystic mastopathy of right breast: Secondary | ICD-10-CM | POA: Diagnosis not present

## 2015-08-31 DIAGNOSIS — N6489 Other specified disorders of breast: Secondary | ICD-10-CM

## 2015-08-31 DIAGNOSIS — M9904 Segmental and somatic dysfunction of sacral region: Secondary | ICD-10-CM | POA: Diagnosis not present

## 2015-08-31 DIAGNOSIS — N641 Fat necrosis of breast: Secondary | ICD-10-CM | POA: Diagnosis not present

## 2015-08-31 DIAGNOSIS — S2000XA Contusion of breast, unspecified breast, initial encounter: Secondary | ICD-10-CM

## 2015-08-31 DIAGNOSIS — M9903 Segmental and somatic dysfunction of lumbar region: Secondary | ICD-10-CM | POA: Diagnosis not present

## 2015-08-31 DIAGNOSIS — M5136 Other intervertebral disc degeneration, lumbar region: Secondary | ICD-10-CM | POA: Diagnosis not present

## 2015-09-02 DIAGNOSIS — M6751 Plica syndrome, right knee: Secondary | ICD-10-CM | POA: Diagnosis not present

## 2015-09-05 DIAGNOSIS — M9904 Segmental and somatic dysfunction of sacral region: Secondary | ICD-10-CM | POA: Diagnosis not present

## 2015-09-05 DIAGNOSIS — M5136 Other intervertebral disc degeneration, lumbar region: Secondary | ICD-10-CM | POA: Diagnosis not present

## 2015-09-05 DIAGNOSIS — M9901 Segmental and somatic dysfunction of cervical region: Secondary | ICD-10-CM | POA: Diagnosis not present

## 2015-09-05 DIAGNOSIS — M9903 Segmental and somatic dysfunction of lumbar region: Secondary | ICD-10-CM | POA: Diagnosis not present

## 2015-09-07 ENCOUNTER — Encounter: Payer: Self-pay | Admitting: Internal Medicine

## 2015-09-07 ENCOUNTER — Ambulatory Visit (INDEPENDENT_AMBULATORY_CARE_PROVIDER_SITE_OTHER): Payer: Medicare Other | Admitting: Internal Medicine

## 2015-09-07 VITALS — BP 129/57 | HR 80 | Temp 98.4°F | Ht 61.0 in | Wt 211.9 lb

## 2015-09-07 DIAGNOSIS — F411 Generalized anxiety disorder: Secondary | ICD-10-CM | POA: Diagnosis not present

## 2015-09-07 DIAGNOSIS — G43009 Migraine without aura, not intractable, without status migrainosus: Secondary | ICD-10-CM

## 2015-09-07 DIAGNOSIS — E038 Other specified hypothyroidism: Secondary | ICD-10-CM | POA: Diagnosis not present

## 2015-09-07 DIAGNOSIS — Z79899 Other long term (current) drug therapy: Secondary | ICD-10-CM

## 2015-09-07 DIAGNOSIS — M9901 Segmental and somatic dysfunction of cervical region: Secondary | ICD-10-CM | POA: Diagnosis not present

## 2015-09-07 DIAGNOSIS — K582 Mixed irritable bowel syndrome: Secondary | ICD-10-CM | POA: Diagnosis not present

## 2015-09-07 DIAGNOSIS — N6489 Other specified disorders of breast: Secondary | ICD-10-CM | POA: Diagnosis not present

## 2015-09-07 DIAGNOSIS — K219 Gastro-esophageal reflux disease without esophagitis: Secondary | ICD-10-CM

## 2015-09-07 DIAGNOSIS — M5136 Other intervertebral disc degeneration, lumbar region: Secondary | ICD-10-CM | POA: Diagnosis not present

## 2015-09-07 DIAGNOSIS — G43909 Migraine, unspecified, not intractable, without status migrainosus: Secondary | ICD-10-CM

## 2015-09-07 DIAGNOSIS — R1031 Right lower quadrant pain: Secondary | ICD-10-CM

## 2015-09-07 DIAGNOSIS — S2000XA Contusion of breast, unspecified breast, initial encounter: Secondary | ICD-10-CM

## 2015-09-07 DIAGNOSIS — G8929 Other chronic pain: Secondary | ICD-10-CM | POA: Diagnosis not present

## 2015-09-07 DIAGNOSIS — E039 Hypothyroidism, unspecified: Secondary | ICD-10-CM | POA: Diagnosis not present

## 2015-09-07 DIAGNOSIS — M9903 Segmental and somatic dysfunction of lumbar region: Secondary | ICD-10-CM | POA: Diagnosis not present

## 2015-09-07 DIAGNOSIS — M9902 Segmental and somatic dysfunction of thoracic region: Secondary | ICD-10-CM | POA: Diagnosis not present

## 2015-09-07 MED ORDER — TRAMADOL HCL 50 MG PO TABS: 50.0000 mg | ORAL_TABLET | Freq: Four times a day (QID) | ORAL | Status: DC | PRN

## 2015-09-07 NOTE — Assessment & Plan Note (Signed)
She has been struggling with IBS, GERD and chronic abdominal pain for a while.  She has had a gynecology work up and that group did not think she had a pelvic pathology.  She knows that when stressed, she has constipation and when the stress goes away she has diarrhea.  She has tried different medications without success.  She is not interested in seeing the GI doctor and feels she should just "live with" the symptoms.  She is hopeful that if her anxiety is better controlled, she will also have improvement in these symptoms.

## 2015-09-07 NOTE — Assessment & Plan Note (Signed)
She reports great improvement in her migraines with tramadol.  She has tried topamax and other medications in the past without success.  She interested in having tramadol as an abortive to see if it continues to help.   Tramadol 50mg  q6hours PRN migraine #30.   I advised her to take 2 tablets at the onset of a migraine and that she could repeat 1 tablet after 4 hours if no improvement in headache.

## 2015-09-07 NOTE — Assessment & Plan Note (Signed)
This is improving.  Her pain is almost resolved.  Mammogram did not show a malignancy.  Continue to monitor.

## 2015-09-07 NOTE — Assessment & Plan Note (Signed)
I think she suffers from stress induced anxiety.  She has an interesting life circumstance which makes her feel isolated and not a part of either a hearing culture in which she grew up or a deaf culture of which she is a part now.  Her family not signing and making limited attempts to include her in conversations is a big barrier to this.  She is following with a psychologist and psychiatrist and she is hopeful that this will help.  She is also graduating school in May which she thinks will take a lot of the pressure off of her.    Plan Continue following with Psychiatry and Therapy.  Continue Buspar and PRN Xanax.

## 2015-09-07 NOTE — Assessment & Plan Note (Signed)
Only acute symptom she reports today is fatigue.  This could be explained by an under treated thyroid.  She is on synthroid.   Plan:  Check TSH Continue synthroid at current dose for now.

## 2015-09-07 NOTE — Progress Notes (Signed)
Subjective:    Patient ID: Donna Davis, female    DOB: 26-Jun-1971, 44 y.o.   MRN: [id number]  CC: follow up for anxiety/depression, hypothyroidism  HPI  Donna Davis is a 44yo woman with PMH of migraines, anxiety/depression, hypothyroidism who presents for follow up.  I have not seen her since June of 2015.  She is deaf and uses an interpreter who is present in room with her.    Donna Davis reports that she is doing pretty well.  She notes that she is beginning to have sinus congestion, rhinorrhea and a dry cough which she thinks is a pending viral illness. She does not have any fever or chills.  She reports getting this regularly in the winter.   Anxiety and depression are "out of control."  She is seeing a psychologist Dr. Channing Mutters and a Psychiatrist Dr. B (long name she cannot remember) at [facility].  She has recently started on buspar, but does not feel that she is seeing the benefit right now.  We had a very good discussion about the sources of her stress.  She is married, but has had stress in the marriage because her husband does not sign.  She notes that she was not born deaf, but hearing impaired, so she learned to sign late.  Her family for the most part does not sign, but she has friends who do, so she feels like she does not "fit" into either hearing culture or deaf culture.  This causes a lot of conflict and stress.  She is also not looking forward to the holidays because she feels very isolated when no one else signs with her.  She reports that having a therapist again is helpful.    Her breast bruise is improved. It is now just a patch of slightly different colored skin.  She has some tenderness and wearing a sports bra is helping.    Tramadol for brusing really helped her migraines.  She reports that it is a "miracle drug."  She has been tried on many therapies in the past for long term control without being able to tolerate them.  She reports this is the first medication that has knocked her  migraine out in less than 24 hours.    She brought in her medications for me to review.   Review of Systems  Constitutional: Positive for fatigue (chronic). Negative for fever, chills and activity change.  HENT: Positive for congestion, hearing loss, postnasal drip, rhinorrhea, sinus pressure and sore throat.   Respiratory: Positive for cough. Negative for shortness of breath and wheezing.   Gastrointestinal: Positive for diarrhea and constipation. Negative for blood in stool and anal bleeding.  Musculoskeletal: Negative for back pain and arthralgias.  Neurological: Positive for headaches. Negative for dizziness and light-headedness.  Psychiatric/Behavioral: Positive for dysphoric mood. Negative for behavioral problems and agitation. The patient is nervous/anxious.        Objective:   Physical Exam  Constitutional: She is oriented to person, place, and time. She appears well-developed and well-nourished. No distress.  HENT:  Head: Normocephalic and atraumatic.  Nose: Mucosal edema and rhinorrhea present. No nose lacerations. Right sinus exhibits maxillary sinus tenderness. Left sinus exhibits maxillary sinus tenderness.  Mouth/Throat: No oropharyngeal exudate.  Neck: Neck supple.  Cardiovascular: Normal rate and regular rhythm.   Pulmonary/Chest: Effort normal and breath sounds normal. No respiratory distress. She has no wheezes.  Abdominal: Soft. Bowel sounds are normal.  Lymphadenopathy:    She has no cervical  adenopathy.  Neurological: She is alert and oriented to person, place, and time.  Psychiatric: She has a normal mood and affect. Her behavior is normal.  She has somewhat pressured speech, signs and verbalizes   BMET, TSH     Assessment & Plan:  RTC in 6 months

## 2015-09-07 NOTE — Patient Instructions (Signed)
[name] - -   It was very good to see you today.   For your migraines, we will do a trial of tramadol since it seemed to work so well for you.   I would suggest, at the onset of a migraine, take 2 tramadol 50mg  tablets.  If the headache is still present in 4-6 hours, you can take one more tablet.    Let me know if this works better for you!  Please schedule a follow up appointment with me in 6 months, sooner if needed.    Thank you  Tramadol tablets What is this medicine? TRAMADOL (TRA ma dole) is a pain reliever. It is used to treat moderate to severe pain in adults. This medicine may be used for other purposes; ask your health care provider or pharmacist if you have questions. What should I tell my health care provider before I take this medicine? They need to know if you have any of these conditions: -brain tumor -depression -drug abuse or addiction -head injury -if you frequently drink alcohol containing drinks -kidney disease or trouble passing urine -liver disease -lung disease, asthma, or breathing problems -seizures or epilepsy -suicidal thoughts, plans, or attempt; a previous suicide attempt by you or a family member -an unusual or allergic reaction to tramadol, codeine, other medicines, foods, dyes, or preservatives -pregnant or trying to get pregnant -breast-feeding How should I use this medicine? Take this medicine by mouth with a full glass of water. Follow the directions on the prescription label. If the medicine upsets your stomach, take it with food or milk. Do not take more medicine than you are told to take. Talk to your pediatrician regarding the use of this medicine in children. Special care may be needed. Overdosage: If you think you have taken too much of this medicine contact a poison control center or emergency room at once. NOTE: This medicine is only for you. Do not share this medicine with others. What if I miss a dose? If you miss a dose, take it as  soon as you can. If it is almost time for your next dose, take only that dose. Do not take double or extra doses. What may interact with this medicine? Do not take this medicine with any of the following medications: -MAOIs like Carbex, Eldepryl, Marplan, Nardil, and Parnate This medicine may also interact with the following medications: -alcohol or medicines that contain alcohol -antihistamines -benzodiazepines -bupropion -carbamazepine or oxcarbazepine -clozapine -cyclobenzaprine -digoxin -furazolidone -linezolid -medicines for depression, anxiety, or psychotic disturbances -medicines for migraine headache like almotriptan, eletriptan, frovatriptan, naratriptan, rizatriptan, sumatriptan, zolmitriptan -medicines for pain like pentazocine, buprenorphine, butorphanol, meperidine, nalbuphine, and propoxyphene -medicines for sleep -muscle relaxants -naltrexone -phenobarbital -phenothiazines like perphenazine, thioridazine, chlorpromazine, mesoridazine, fluphenazine, prochlorperazine, promazine, and trifluoperazine -procarbazine -warfarin This list may not describe all possible interactions. Give your health care provider a list of all the medicines, herbs, non-prescription drugs, or dietary supplements you use. Also tell them if you smoke, drink alcohol, or use illegal drugs. Some items may interact with your medicine. What should I watch for while using this medicine? Tell your doctor or health care professional if your pain does not go away, if it gets worse, or if you have new or a different type of pain. You may develop tolerance to the medicine. Tolerance means that you will need a higher dose of the medicine for pain relief. Tolerance is normal and is expected if you take this medicine for a long time. Do not  suddenly stop taking your medicine because you may develop a severe reaction. Your body becomes used to the medicine. This does NOT mean you are addicted. Addiction is a behavior  related to getting and using a drug for a non-medical reason. If you have pain, you have a medical reason to take pain medicine. Your doctor will tell you how much medicine to take. If your doctor wants you to stop the medicine, the dose will be slowly lowered over time to avoid any side effects. You may get drowsy or dizzy. Do not drive, use machinery, or do anything that needs mental alertness until you know how this medicine affects you. Do not stand or sit up quickly, especially if you are an older patient. This reduces the risk of dizzy or fainting spells. Alcohol can increase or decrease the effects of this medicine. Avoid alcoholic drinks. You may have constipation. Try to have a bowel movement at least every 2 to 3 days. If you do not have a bowel movement for 3 days, call your doctor or health care professional. Your mouth may get dry. Chewing sugarless gum or sucking hard candy, and drinking plenty of water may help. Contact your doctor if the problem does not go away or is severe. What side effects may I notice from receiving this medicine? Side effects that you should report to your doctor or health care professional as soon as possible: -allergic reactions like skin rash, itching or hives, swelling of the face, lips, or tongue -breathing difficulties, wheezing -confusion -itching -light headedness or fainting spells -redness, blistering, peeling or loosening of the skin, including inside the mouth -seizures Side effects that usually do not require medical attention (report to your doctor or health care professional if they continue or are bothersome): -constipation -dizziness -drowsiness -headache -nausea, vomiting This list may not describe all possible side effects. Call your doctor for medical advice about side effects. You may report side effects to FDA at 1-800-FDA-1088. Where should I keep my medicine? Keep out of the reach of children. This medicine may cause accidental  overdose and death if it taken by other adults, children, or pets. Mix any unused medicine with a substance like cat litter or coffee grounds. Then throw the medicine away in a sealed container like a sealed bag or a coffee can with a lid. Do not use the medicine after the expiration date. Store at room temperature between 15 and 30 degrees C (59 and 86 degrees F). NOTE: This sheet is a summary. It may not cover all possible information. If you have questions about this medicine, talk to your doctor, pharmacist, or health care provider.    2016, Elsevier/Gold Standard. (2013-10-30 15:42:09)

## 2015-09-07 NOTE — Assessment & Plan Note (Signed)
This is related to her IBS.  Please see that problem for more information.

## 2015-09-07 NOTE — Assessment & Plan Note (Signed)
This is mostly related to her IBS.  Please see that problem.

## 2015-09-08 DIAGNOSIS — M9901 Segmental and somatic dysfunction of cervical region: Secondary | ICD-10-CM | POA: Diagnosis not present

## 2015-09-08 DIAGNOSIS — M9903 Segmental and somatic dysfunction of lumbar region: Secondary | ICD-10-CM | POA: Diagnosis not present

## 2015-09-08 DIAGNOSIS — M5136 Other intervertebral disc degeneration, lumbar region: Secondary | ICD-10-CM | POA: Diagnosis not present

## 2015-09-08 DIAGNOSIS — M9902 Segmental and somatic dysfunction of thoracic region: Secondary | ICD-10-CM | POA: Diagnosis not present

## 2015-09-08 LAB — BMP8+ANION GAP
ANION GAP: 14 mmol/L (ref 10.0–18.0)
BUN/Creatinine Ratio: 16 (ref 9–23)
BUN: 11 mg/dL (ref 6–24)
CALCIUM: 9.6 mg/dL (ref 8.7–10.2)
CO2: 24 mmol/L (ref 18–29)
Chloride: 99 mmol/L (ref 96–106)
Creatinine, Ser: 0.68 mg/dL (ref 0.57–1.00)
GFR calc Af Amer: 121 mL/min/{1.73_m2} (ref >59)
GFR, EST NON AFRICAN AMERICAN: 105 mL/min/{1.73_m2} (ref >59)
Glucose: 84 mg/dL (ref 65–99)
POTASSIUM: 4.6 mmol/L (ref 3.5–5.2)
SODIUM: 137 mmol/L (ref 134–144)

## 2015-09-08 LAB — TSH: TSH: 2 u[IU]/mL (ref 0.450–4.500)

## 2015-09-14 ENCOUNTER — Encounter: Payer: Self-pay | Admitting: Internal Medicine

## 2015-09-22 ENCOUNTER — Other Ambulatory Visit: Payer: Self-pay | Admitting: Internal Medicine

## 2015-09-22 MED ORDER — ALPRAZOLAM 0.25 MG PO TABS: 0.2500 mg | ORAL_TABLET | Freq: Every evening | ORAL | Status: DC | PRN

## 2015-09-22 NOTE — Telephone Encounter (Signed)
Called to pharm 

## 2015-09-29 DIAGNOSIS — M9902 Segmental and somatic dysfunction of thoracic region: Secondary | ICD-10-CM | POA: Diagnosis not present

## 2015-09-29 DIAGNOSIS — M5136 Other intervertebral disc degeneration, lumbar region: Secondary | ICD-10-CM | POA: Diagnosis not present

## 2015-09-29 DIAGNOSIS — M9901 Segmental and somatic dysfunction of cervical region: Secondary | ICD-10-CM | POA: Diagnosis not present

## 2015-09-29 DIAGNOSIS — M9903 Segmental and somatic dysfunction of lumbar region: Secondary | ICD-10-CM | POA: Diagnosis not present

## 2015-09-30 ENCOUNTER — Encounter (HOSPITAL_COMMUNITY): Payer: Self-pay | Admitting: Emergency Medicine

## 2015-09-30 ENCOUNTER — Emergency Department (HOSPITAL_COMMUNITY)
Admission: EM | Admit: 2015-09-30 | Discharge: 2015-09-30 | Disposition: A | Discharge status: Home / Self Care | Payer: Medicare Other | Attending: Emergency Medicine | Admitting: Emergency Medicine

## 2015-09-30 DIAGNOSIS — E039 Hypothyroidism, unspecified: Secondary | ICD-10-CM | POA: Insufficient documentation

## 2015-09-30 DIAGNOSIS — F419 Anxiety disorder, unspecified: Secondary | ICD-10-CM | POA: Diagnosis not present

## 2015-09-30 DIAGNOSIS — R21 Rash and other nonspecific skin eruption: Secondary | ICD-10-CM | POA: Diagnosis present

## 2015-09-30 DIAGNOSIS — Z880 Allergy status to penicillin: Secondary | ICD-10-CM | POA: Insufficient documentation

## 2015-09-30 DIAGNOSIS — H527 Unspecified disorder of refraction: Secondary | ICD-10-CM | POA: Diagnosis not present

## 2015-09-30 DIAGNOSIS — Z79899 Other long term (current) drug therapy: Secondary | ICD-10-CM | POA: Insufficient documentation

## 2015-09-30 DIAGNOSIS — Z87891 Personal history of nicotine dependence: Secondary | ICD-10-CM | POA: Diagnosis not present

## 2015-09-30 DIAGNOSIS — Z8619 Personal history of other infectious and parasitic diseases: Secondary | ICD-10-CM | POA: Diagnosis not present

## 2015-09-30 DIAGNOSIS — Z8719 Personal history of other diseases of the digestive system: Secondary | ICD-10-CM | POA: Diagnosis not present

## 2015-09-30 DIAGNOSIS — H9190 Unspecified hearing loss, unspecified ear: Secondary | ICD-10-CM | POA: Insufficient documentation

## 2015-09-30 DIAGNOSIS — H539 Unspecified visual disturbance: Secondary | ICD-10-CM | POA: Diagnosis not present

## 2015-09-30 DIAGNOSIS — L089 Local infection of the skin and subcutaneous tissue, unspecified: Secondary | ICD-10-CM | POA: Insufficient documentation

## 2015-09-30 DIAGNOSIS — L03211 Cellulitis of face: Secondary | ICD-10-CM | POA: Diagnosis not present

## 2015-09-30 MED ORDER — FLUORESCEIN SODIUM 1 MG OP STRP: 1.0000 | ORAL_STRIP | Freq: Once | OPHTHALMIC | Status: DC

## 2015-09-30 MED ORDER — HYDROCODONE-ACETAMINOPHEN 5-325 MG PO TABS: 1.0000 | ORAL_TABLET | Freq: Once | ORAL | Status: DC

## 2015-09-30 MED ORDER — CLINDAMYCIN HCL 300 MG PO CAPS: 300.0000 mg | ORAL_CAPSULE | Freq: Four times a day (QID) | ORAL | Status: DC

## 2015-09-30 NOTE — Discharge Instructions (Signed)
Drive to the eye doctor office immediately for your follow up appointment.   Cellulitis    Cellulitis is an infection of the skin and the tissue beneath it. The infected area is usually red and tender. Cellulitis occurs most often in the arms and lower legs.  CAUSES  Cellulitis is caused by bacteria that enter the skin through cracks or cuts in the skin. The most common types of bacteria that cause cellulitis are staphylococci and streptococci.  SIGNS AND SYMPTOMS  Redness and warmth.  Swelling.  Tenderness or pain.  Fever. DIAGNOSIS  Your health care provider can usually determine what is wrong based on a physical exam. Blood tests may also be done.  TREATMENT  Treatment usually involves taking an antibiotic medicine.  HOME CARE INSTRUCTIONS  Take your antibiotic medicine as directed by your health care provider. Finish the antibiotic even if you start to feel better.  Keep the infected arm or leg elevated to reduce swelling.  Apply a warm cloth to the affected area up to 4 times per day to relieve pain.  Take medicines only as directed by your health care provider.  Keep all follow-up visits as directed by your health care provider. SEEK MEDICAL CARE IF:  You notice red streaks coming from the infected area.  Your red area gets larger or turns dark in color.  Your bone or joint underneath the infected area becomes painful after the skin has healed.  Your infection returns in the same area or another area.  You notice a swollen bump in the infected area.  You develop new symptoms.  You have a fever. SEEK IMMEDIATE MEDICAL CARE IF:  You feel very sleepy.  You develop vomiting or diarrhea.  You have a general ill feeling (malaise) with muscle aches and pains. This information is not intended to replace advice given to you by your health care provider. Make sure you discuss any questions you have with your health care provider.  Document Released: 06/13/2005 Document Revised:  05/25/2015 Document Reviewed: 11/19/2011  Elsevier Interactive Patient Education [employer].

## 2015-09-30 NOTE — ED Notes (Signed)
Notes from the previous chart are supposed to be transferred from wrong account by registration.  Registration advised not to redo notes.

## 2015-09-30 NOTE — ED Provider Notes (Signed)
CSN: [id number]     Arrival date & time 09/30/15  1227 History  By signing my name below, I, Soijett Blue, attest that this documentation has been prepared under the direction and in the presence of Josephina Gip, PA-C Electronically Signed: Soijett Blue, ED Scribe. 09/30/2015. 12:11 PM.   Chief Complaint  Patient presents with  . Rash   The history is provided by the patient. No language interpreter was used.   Donna Davis is a 45 y.o. female who presents to the Emergency Department complaining of rash to left sided face onset 2 days. The area is dime sized and below her eye. She states that it is painful to touch and seems to be getting redder. She reports that now the entire left side of her cheek is tender to touch. She describes associated burning sensation over the rash. She notes that this morning she woke and the rash was seeping clear fluid. She also complains of left cheek swelling and blurred vision. She notes that she has not tried any medications for the relief of her symptoms. She has not had any sick contacts. Denies fevers, chills, loss of vision, headache, ear pain, eye pain, sore throat, neck pain, abdominal pain, nausea, vomiting or any other symptoms.   Of note, pt is deaf and speaks by sign language and lip reading. An ASL interpreter was used  Past Medical History  Diagnosis Date  . GERD (gastroesophageal reflux disease)   . Hypothyroidism   . IBS (irritable bowel syndrome)   . Anxiety   . Hearing difficulty     b/l, 2/2 congenital rubella s/p stapedectomy. Using hearing aid  . Headache(784.0) 04/2005    h/o headache in past that raised question of psuedotumor s/p therapeutic LP with an opening preassure of 18 cm H2O   . Hepatic cyst     6 mm on CT done done in 05/2005  . Family history of malignant neoplasm of gastrointestinal tract   . Shingles 03/2013   Past Surgical History  Procedure Laterality Date  . Total abdominal hysterectomy  2005    Endometriosis with  residual right ovary. Multiple GYN surgeries for chronic pelvic pain; had LSO in past  . Cesarean section  1994  . Stapedectomy  2007    Dr. Lucia Gaskins, left ear  . Laparoscopic ovarian cystectomy  2001  . Thyroid nodule removed  1997  . Abdominal adhesion surgery      removal from bowel  . Joint replacement    . Cholecystectomy N/A 06/26/2013    Procedure: LAPAROSCOPIC CHOLECYSTECTOMY WITH ATTEMPTED INTRAOPERATIVE CHOLANGIOGRAM;  Surgeon: Harl Bowie, MD;  Location: [facility];  Service: General;  Laterality: N/A;  . Salpingoophorectomy Left    Family History  Problem Relation Age of Onset  . Breast cancer Mother   . Ovarian cancer Mother   . Diabetes Mother   . Stomach cancer Maternal Grandmother   . Colon cancer Maternal Grandmother   . Diabetes Maternal Grandmother   . Pancreatic cancer Father   . Prostate cancer Father   . Colon cancer Father   . Diabetes Father   . Heart disease Father   . Irritable bowel syndrome Father   . Kidney disease Father   . Heart disease      both grandmothers  . Bipolar disorder Daughter    Social History  Substance Use Topics  . Smoking status: Former Smoker    Types: Cigarettes    Quit date: 02/18/2008  . Smokeless tobacco: Never  Used     Comment: quit 74yrs ago  . Alcohol Use: 0.0 oz/week    0 Standard drinks or equivalent per week     Comment: Rarely.   OB History    Gravida Para Term Preterm AB TAB SAB Ectopic Multiple Living   3 2 2  1 1    2      Review of Systems  HENT: Positive for facial swelling.   Eyes: Positive for visual disturbance.  Skin: Positive for rash.  All other systems reviewed and are negative.     Allergies  Onion; Shellfish allergy; Amitriptyline; Amoxicillin-pot clavulanate; Cefuroxime; Cephalexin; Ciprofloxacin; Clarithromycin; Doxycycline; Propranolol; Sulfonamide derivatives; Benadryl; and Shrimp flavor  Home Medications   Prior to Admission medications   Medication Sig Start Date End Date  Taking? Authorizing Provider  acetaminophen (TYLENOL) 500 MG tablet Take 2 tablets (1,000 mg total) by mouth every 8 (eight) hours as needed for moderate pain. 08/26/15 08/25/16  Loleta Chance, MD  albuterol (PROVENTIL HFA;VENTOLIN HFA) 108 (90 BASE) MCG/ACT inhaler Inhale into the lungs every 6 (six) hours as needed for wheezing or shortness of breath.    Historical Provider, MD  ALPRAZolam Duanne Moron) 0.25 MG tablet Take 1 tablet (0.25 mg total) by mouth at bedtime as needed. 09/22/15   Sid Falcon, MD  Ascorbic Acid (VITAMIN C PO) Take 1 tablet by mouth daily.    Historical Provider, MD  benzonatate (TESSALON) 100 MG capsule Take 1 capsule (100 mg total) by mouth 3 (three) times daily as needed for cough. Patient not taking: Reported on 01/13/2015 01/12/15   Lutricia Feil, PA  Cholecalciferol (VITAMIN D PO) Take 1 tablet by mouth daily.    Historical Provider, MD  clindamycin (CLEOCIN) 300 MG capsule Take 1 capsule (300 mg total) by mouth 4 (four) times daily. 09/30/15   Cricket Goodlin, PA-C  estradiol (ESTRACE) 1 MG tablet Take 1 tablet (1 mg total) by mouth daily. 06/09/15   Osborne Oman, MD  ipratropium (ATROVENT) 0.06 % nasal spray Place 2 sprays into both nostrils 4 (four) times daily. For nasal congestion 01/12/15   Lutricia Feil, PA  levothyroxine (SYNTHROID, LEVOTHROID) 25 MCG tablet TAKE 1 TABLET BY MOUTH DAILY 04/11/15   Sid Falcon, MD  ondansetron (ZOFRAN) 8 MG tablet Take 1 tablet (8 mg total) by mouth every 8 (eight) hours as needed for nausea or vomiting. 06/12/15   Mercedes Camprubi-Soms, PA-C  OVER THE COUNTER MEDICATION Take 1 tablet by mouth daily. Harvey Provider, MD  polyethylene glycol (MIRALAX / GLYCOLAX) packet Take 17 g by mouth 2 (two) times daily. Take 17g PO BID until you have daily soft stools, then reduce to once daily or every other day to maintain daily soft stools 06/12/15   Mercedes Camprubi-Soms, PA-C  promethazine (PHENERGAN) 25 MG  tablet Take 25 mg by mouth every 6 (six) hours as needed for nausea or vomiting.    Historical Provider, MD  traMADol (ULTRAM) 50 MG tablet Take 1 tablet (50 mg total) by mouth every 6 (six) hours as needed. 09/07/15 09/06/16  Sid Falcon, MD   BP 155/91 mmHg  Pulse 78  Temp(Src) 98.3 F (36.8 C) (Oral)  Resp 18  SpO2 99%  LMP 12/26/2004 Physical Exam  Constitutional: She appears well-developed and well-nourished. No distress.  HENT:  Head: Normocephalic and atraumatic.    Right Ear: Tympanic membrane and ear canal normal.  Left Ear: Tympanic membrane and ear canal normal.  Small, 2 cm area of erythema noted to left cheek. Some serous drainage from the area without definite vesicles. No crusting. No streaking. No induration or fluctuance. No desquamation. Entire left cheek is exquisitely TTP. No significant swelling of left cheek when compared to right. No vesicles, erythema or tenderness on ear exam.   Eyes: Conjunctivae and EOM are normal. Pupils are equal, round, and reactive to light. Right eye exhibits no discharge. Left eye exhibits no discharge. No scleral icterus.  Slit lamp exam:      The left eye shows no corneal abrasion, no corneal flare, no corneal ulcer and no fluorescein uptake.  On fluorescein exam, no dendritic lesions noted. No conjunctival injection. PERRL. EOM without pain.     Visual Acuity  Right Eye Distance: 20/50 Left Eye Distance: 20/100 Bilateral Distance: 20/40   Neck: Normal range of motion.  Cardiovascular: Normal rate and regular rhythm.   Pulmonary/Chest: Effort normal. No respiratory distress.  Musculoskeletal: Normal range of motion.  Neurological: She is alert. Coordination normal.  Skin: Skin is warm and dry.  Psychiatric: She has a normal mood and affect. Her behavior is normal.  Nursing note and vitals reviewed.   ED Course  Procedures (including critical care time) DIAGNOSTIC STUDIES: Oxygen Saturation is 99% on RA, nl by my  interpretation.    COORDINATION OF CARE: 12:10 PM Discussed treatment plan with pt at bedside and pt agreed to plan.    Labs Review Labs Reviewed - No data to display  Imaging Review No results found.    EKG Interpretation None      MDM   Final diagnoses:  Infection, face   45 year old female presenting with rash to the left cheek. VSS. Small patch of erythema noted to left cheek with serous drainage. No vesicles or crusting. No desquamation. No obvious abscess. Left cheek extremely TTP. No dendritic lesions or involvement of the left ear. Visual acuity of left eye diminished to 20/100. Discussed case with Dr. Roderic Palau who evaluated pt personally. Question shingles with ocular involvement vs superficial skin infection. Consulted ophtho (Dr. Arlina Robes). Patient is to leave ED and drive to [place] [facility] for evaluation. Will discharge with abx for superficial skin infection that pt will take if ophtho does not believe she has shingles. Also discussed follow up with her PCP in 3-4 days for skin recheck. Return precautions given in discharge paperwork and discussed with pt at bedside. Pt stable for discharge  I personally performed the services described in this documentation, which was scribed in my presence. The recorded information has been reviewed and is accurate.    Josephina Gip, PA-C 09/30/15 1746  Milton Ferguson, MD 10/01/15 [phone]

## 2015-09-30 NOTE — ED Notes (Signed)
POV, c/o of facial rash X 2days with burning and swelling, no other complaints, ambulatory and in NAD

## 2015-09-30 NOTE — ED Notes (Signed)
Original acuity 3, because of needing interpreter, etc.

## 2015-10-06 ENCOUNTER — Encounter: Payer: Self-pay | Admitting: Internal Medicine

## 2015-10-06 ENCOUNTER — Ambulatory Visit (INDEPENDENT_AMBULATORY_CARE_PROVIDER_SITE_OTHER): Payer: Medicare Other | Admitting: Internal Medicine

## 2015-10-06 VITALS — BP 146/90 | HR 90 | Temp 98.8°F | Ht 61.0 in | Wt 210.0 lb

## 2015-10-06 DIAGNOSIS — L03211 Cellulitis of face: Secondary | ICD-10-CM | POA: Insufficient documentation

## 2015-10-06 DIAGNOSIS — L539 Erythematous condition, unspecified: Secondary | ICD-10-CM

## 2015-10-06 MED ORDER — HYDROCODONE-ACETAMINOPHEN 7.5-325 MG PO TABS: 1.0000 | ORAL_TABLET | Freq: Four times a day (QID) | ORAL | Status: DC | PRN

## 2015-10-06 MED ORDER — DOXYCYCLINE HYCLATE 100 MG PO TABS: 100.0000 mg | ORAL_TABLET | Freq: Two times a day (BID) | ORAL | Status: DC

## 2015-10-06 MED ORDER — VALACYCLOVIR HCL 1 G PO TABS: 1000.0000 mg | ORAL_TABLET | Freq: Three times a day (TID) | ORAL | Status: AC

## 2015-10-06 NOTE — Patient Instructions (Signed)
General Instructions:  I want you to take Doxycycline 100mg  twice a day for 10 days  You can also take Valacyclovir 1g three times a day for 7 days in case this could be shingles but I doubt that.  Please bring your medicines with you each time you come to clinic.  Medicines may include prescription medications, over-the-counter medications, herbal remedies, eye drops, vitamins, or other pills.   Progress Toward Treatment Goals:  No flowsheet data found.  Self Care Goals & Plans:  Self Care Goal 12/11/2012  Manage my medications take my medicines as prescribed; bring my medications to every visit    No flowsheet data found.   Care Management & Community Referrals:  Referral 03/09/2013  Referrals made to community resources none

## 2015-10-06 NOTE — Progress Notes (Signed)
[facility] Subjective:   Patient ID: Donna Davis female   DOB: 1971-08-19 45 y.o.   MRN: [id number]  HPI: Donna Davis is a 45 y.o. female with a PMH detailed below who presents for ER follow up of a painful facial rash.  She was seen about 1 week ago in the emergency department for redness and pain on her left cheek.  She was noted to have some decreased visual acuity on the left eye too.  She was discharged with clindamycin and instructed to follow up with opthalmology.  She did follow up with opthalmology was was told there is nothing wrong with her eye that the decreased visual acuity is due to surrounding pressure. She reports she has been taking the clindamycin as directed and felt that the swelling, redness and pain was decreasing until this morning when she noticed that it spread more.  She has not had any fever or chills but has been taking tylenol.  She reports severe pain in her cheek and left ear.   She has previous had shingles but not in the trigeminal region.  She has not seen in vesicles.    Past Medical History  Diagnosis Date  . GERD (gastroesophageal reflux disease)   . Hypothyroidism   . IBS (irritable bowel syndrome)   . Anxiety   . Hearing difficulty     b/l, 2/2 congenital rubella s/p stapedectomy. Using hearing aid  . Headache(784.0) 04/2005    h/o headache in past that raised question of psuedotumor s/p therapeutic LP with an opening preassure of 18 cm H2O   . Hepatic cyst     6 mm on CT done done in 05/2005  . Family history of malignant neoplasm of gastrointestinal tract   . Shingles 03/2013   Current Outpatient Prescriptions  Medication Sig Dispense Refill  . acetaminophen (TYLENOL) 500 MG tablet Take 2 tablets (1,000 mg total) by mouth every 8 (eight) hours as needed for moderate pain. 100 tablet 2  . albuterol (PROVENTIL HFA;VENTOLIN HFA) 108 (90 BASE) MCG/ACT inhaler Inhale into the lungs every 6 (six) hours as needed for wheezing  or shortness of breath.    . ALPRAZolam (XANAX) 0.25 MG tablet Take 1 tablet (0.25 mg total) by mouth at bedtime as needed. 30 tablet 5  . Ascorbic Acid (VITAMIN C PO) Take 1 tablet by mouth daily.    . benzonatate (TESSALON) 100 MG capsule Take 1 capsule (100 mg total) by mouth 3 (three) times daily as needed for cough. (Patient not taking: Reported on 01/13/2015) 21 capsule 0  . Cholecalciferol (VITAMIN D PO) Take 1 tablet by mouth daily.    . clindamycin (CLEOCIN) 300 MG capsule Take 1 capsule (300 mg total) by mouth 4 (four) times daily. 28 capsule 0  . doxycycline (VIBRA-TABS) 100 MG tablet Take 1 tablet (100 mg total) by mouth 2 (two) times daily. 20 tablet 0  . estradiol (ESTRACE) 1 MG tablet Take 1 tablet (1 mg total) by mouth daily. 30 tablet 10  . HYDROcodone-acetaminophen (NORCO) 7.5-325 MG tablet Take 1 tablet by mouth every 6 (six) hours as needed for moderate pain. 30 tablet 0  . ipratropium (ATROVENT) 0.06 % nasal spray Place 2 sprays into both nostrils 4 (four) times daily. For nasal congestion 15 mL 0  . levothyroxine (SYNTHROID, LEVOTHROID) 25 MCG tablet TAKE 1 TABLET BY MOUTH DAILY 31 tablet 11  . ondansetron (ZOFRAN) 8 MG tablet Take 1 tablet (8 mg total) by mouth  every 8 (eight) hours as needed for nausea or vomiting. 10 tablet 0  . OVER THE COUNTER MEDICATION Take 1 tablet by mouth daily. SlimQuick    . polyethylene glycol (MIRALAX / GLYCOLAX) packet Take 17 g by mouth 2 (two) times daily. Take 17g PO BID until you have daily soft stools, then reduce to once daily or every other day to maintain daily soft stools 14 each 0  . promethazine (PHENERGAN) 25 MG tablet Take 25 mg by mouth every 6 (six) hours as needed for nausea or vomiting.    . traMADol (ULTRAM) 50 MG tablet Take 1 tablet (50 mg total) by mouth every 6 (six) hours as needed. 30 tablet 0  . valACYclovir (VALTREX) 1000 MG tablet Take 1 tablet (1,000 mg total) by mouth 3 (three) times daily. 21 tablet 0   No current  facility-administered medications for this visit.   Family History  Problem Relation Age of Onset  . Breast cancer Mother   . Ovarian cancer Mother   . Diabetes Mother   . Stomach cancer Maternal Grandmother   . Colon cancer Maternal Grandmother   . Diabetes Maternal Grandmother   . Pancreatic cancer Father   . Prostate cancer Father   . Colon cancer Father   . Diabetes Father   . Heart disease Father   . Irritable bowel syndrome Father   . Kidney disease Father   . Heart disease      both grandmothers  . Bipolar disorder Daughter    Social History   Social History  . Marital Status: Married    Spouse Name: N/A  . Number of Children: 2  . Years of Education: N/A   Occupational History  . student    Social History Main Topics  . Smoking status: Former Smoker    Types: Cigarettes    Quit date: 02/18/2008  . Smokeless tobacco: Never Used     Comment: quit 34yrs ago  . Alcohol Use: 0.0 oz/week    0 Standard drinks or equivalent per week     Comment: Rarely.  . Drug Use: No  . Sexual Activity: Not Asked   Other Topics Concern  . None   Social History Narrative   Current smoker, not using alcohol or drugs at this time   Lives with husband and 2 children   Review of Systems: Review of Systems  Constitutional: Negative for fever and chills.  HENT: Positive for ear pain. Negative for congestion, ear discharge, nosebleeds and sore throat.   Eyes: Positive for blurred vision (left eye). Negative for double vision and photophobia.  Skin: Positive for rash. Negative for itching.  Neurological: Positive for headaches. Negative for tingling, sensory change and focal weakness.     Objective:  Physical Exam: Filed Vitals:   10/06/15 1621  BP: 146/90  Pulse: 90  Temp: 98.8 F (37.1 C)  TempSrc: Oral  Height: 5\' 1"  (1.549 m)  Weight: 210 lb (95.255 kg)  SpO2: 100%  Physical Exam  HENT:  Right Ear: Hearing, tympanic membrane, external ear and ear canal normal.    Left Ear: Hearing, tympanic membrane, external ear and ear canal normal.  Mouth/Throat: Oropharynx is clear and moist.  Eyes: Pupils are equal, round, and reactive to light.  Skin:  She has a red beefy erythema of her left lower face from underneath the eye socket to the nasal fold to the corner of her lips.  There are no vesicles present, no areas of hard induration, the area  is painful to the touch and is warm.  Nursing note and vitals reviewed.     Assessment & Plan:  Case discussed and patient seen with Dr. Clement Sayres  Erythema of face A: Erythema of face, suspect Bacterial infection (staph) versus less likely herpes zoster  P: - Clindamycin via local antibiogram is 76% effective at treating MRSA.  She has a sulfa allergy and cannot tolerate bactrim.  She does admit some GI upset with Doxycycline but feels she can tolerate it and doxy is 93% effective via local antibiogram.  Will prescribe 10 day course of doxycyline and have patient follow up in 1 week. - I do not see any vesicles charasteric of zoster however she does have a history of zoster and the rash is one sided.  It appears to cross the V2 to V3 dermatone and is very painful (possibly more neuropathic).  I have told Donna Davis that I feel this is more likely a bacterial process but it is hard to fully rule out zoster as a potential cause.  I have provided her a 7 day Rx of Valacyclovir for treatment.  She is concerned about cost of the medication and reports she wants to give the doxycycline a chance first. - Rx Hydrocodone for acute pain.    Medications Ordered Meds ordered this encounter  Medications  . doxycycline (VIBRA-TABS) 100 MG tablet    Sig: Take 1 tablet (100 mg total) by mouth 2 (two) times daily.    Dispense:  20 tablet    Refill:  0  . valACYclovir (VALTREX) 1000 MG tablet    Sig: Take 1 tablet (1,000 mg total) by mouth 3 (three) times daily.    Dispense:  21 tablet    Refill:  0  . HYDROcodone-acetaminophen  (NORCO) 7.5-325 MG tablet    Sig: Take 1 tablet by mouth every 6 (six) hours as needed for moderate pain.    Dispense:  30 tablet    Refill:  0   Other Orders No orders of the defined types were placed in this encounter.   Follow Up: Return in about 1 week (around 10/13/2015).

## 2015-10-07 NOTE — Progress Notes (Signed)
Internal Medicine Clinic Attending  I saw and evaluated the patient.  I personally confirmed the key portions of the history and exam documented by Dr. Hoffman and I reviewed pertinent patient test results.  The assessment, diagnosis, and plan were formulated together and I agree with the documentation in the resident's note.      

## 2015-10-07 NOTE — Assessment & Plan Note (Signed)
A: Erythema of face, suspect Bacterial infection (staph) versus less likely herpes zoster  P: - Clindamycin via local antibiogram is 76% effective at treating MRSA.  She has a sulfa allergy and cannot tolerate bactrim.  She does admit some GI upset with Doxycycline but feels she can tolerate it and doxy is 93% effective via local antibiogram.  Will prescribe 10 day course of doxycyline and have patient follow up in 1 week. - I do not see any vesicles charasteric of zoster however she does have a history of zoster and the rash is one sided.  It appears to cross the V2 to V3 dermatone and is very painful (possibly more neuropathic).  I have told Ms alfredo that I feel this is more likely a bacterial process but it is hard to fully rule out zoster as a potential cause.  I have provided her a 7 day Rx of Valacyclovir for treatment.  She is concerned about cost of the medication and reports she wants to give the doxycycline a chance first. - Rx Hydrocodone for acute pain.

## 2015-10-12 DIAGNOSIS — Z4789 Encounter for other orthopedic aftercare: Secondary | ICD-10-CM | POA: Diagnosis not present

## 2015-10-14 DIAGNOSIS — M9901 Segmental and somatic dysfunction of cervical region: Secondary | ICD-10-CM | POA: Diagnosis not present

## 2015-10-14 DIAGNOSIS — M5136 Other intervertebral disc degeneration, lumbar region: Secondary | ICD-10-CM | POA: Diagnosis not present

## 2015-10-14 DIAGNOSIS — M9902 Segmental and somatic dysfunction of thoracic region: Secondary | ICD-10-CM | POA: Diagnosis not present

## 2015-10-14 DIAGNOSIS — M9903 Segmental and somatic dysfunction of lumbar region: Secondary | ICD-10-CM | POA: Diagnosis not present

## 2015-10-14 DIAGNOSIS — L03211 Cellulitis of face: Secondary | ICD-10-CM | POA: Diagnosis not present

## 2015-10-18 ENCOUNTER — Encounter: Payer: Self-pay | Admitting: Internal Medicine

## 2015-10-19 ENCOUNTER — Telehealth: Payer: Self-pay | Admitting: *Deleted

## 2015-10-19 NOTE — Telephone Encounter (Signed)
-----   Message from Sid Falcon, MD sent at 10/18/2015  4:36 PM EST ----- Holley Raring - -  Can you contact Ms. Pustejovsky tomorrow to get her scheduled for an appointment.  She is deaf and needs to be contacted through a special service, I am not sure how to do that.  We can work on it tmw morning  Thanks! EBM

## 2015-10-19 NOTE — Telephone Encounter (Signed)
Pt called - no answer; left message per interpreter services on pt's telephone line to call and schedule an appt if her facial infection has not resolved.

## 2015-10-20 DIAGNOSIS — M5136 Other intervertebral disc degeneration, lumbar region: Secondary | ICD-10-CM | POA: Diagnosis not present

## 2015-10-20 DIAGNOSIS — M9901 Segmental and somatic dysfunction of cervical region: Secondary | ICD-10-CM | POA: Diagnosis not present

## 2015-10-20 DIAGNOSIS — M9902 Segmental and somatic dysfunction of thoracic region: Secondary | ICD-10-CM | POA: Diagnosis not present

## 2015-10-20 DIAGNOSIS — M9903 Segmental and somatic dysfunction of lumbar region: Secondary | ICD-10-CM | POA: Diagnosis not present

## 2015-10-21 DIAGNOSIS — L03211 Cellulitis of face: Secondary | ICD-10-CM | POA: Diagnosis not present

## 2015-10-22 ENCOUNTER — Encounter: Payer: Self-pay | Admitting: Internal Medicine

## 2015-10-25 NOTE — Telephone Encounter (Signed)
Thank you :)

## 2015-11-02 ENCOUNTER — Encounter: Payer: Self-pay | Admitting: Internal Medicine

## 2015-11-02 DIAGNOSIS — M9902 Segmental and somatic dysfunction of thoracic region: Secondary | ICD-10-CM | POA: Diagnosis not present

## 2015-11-02 DIAGNOSIS — M9901 Segmental and somatic dysfunction of cervical region: Secondary | ICD-10-CM | POA: Diagnosis not present

## 2015-11-02 DIAGNOSIS — M5136 Other intervertebral disc degeneration, lumbar region: Secondary | ICD-10-CM | POA: Diagnosis not present

## 2015-11-02 DIAGNOSIS — M9903 Segmental and somatic dysfunction of lumbar region: Secondary | ICD-10-CM | POA: Diagnosis not present

## 2015-11-04 ENCOUNTER — Encounter: Payer: Self-pay | Admitting: Internal Medicine

## 2015-11-04 ENCOUNTER — Other Ambulatory Visit: Payer: Self-pay | Admitting: Internal Medicine

## 2015-11-04 DIAGNOSIS — N6489 Other specified disorders of breast: Secondary | ICD-10-CM

## 2015-11-04 DIAGNOSIS — S2000XA Contusion of breast, unspecified breast, initial encounter: Secondary | ICD-10-CM

## 2015-11-05 MED ORDER — TRAMADOL HCL 50 MG PO TABS: 50.0000 mg | ORAL_TABLET | Freq: Four times a day (QID) | ORAL | Status: DC | PRN

## 2015-11-07 NOTE — Telephone Encounter (Signed)
Called to pharm 

## 2015-11-10 ENCOUNTER — Encounter: Payer: Self-pay | Admitting: Internal Medicine

## 2015-11-10 ENCOUNTER — Ambulatory Visit (INDEPENDENT_AMBULATORY_CARE_PROVIDER_SITE_OTHER): Payer: Medicare Other | Admitting: Internal Medicine

## 2015-11-10 VITALS — BP 133/87 | HR 96 | Temp 98.5°F | Ht 61.0 in | Wt 211.6 lb

## 2015-11-10 DIAGNOSIS — B9689 Other specified bacterial agents as the cause of diseases classified elsewhere: Secondary | ICD-10-CM | POA: Diagnosis not present

## 2015-11-10 DIAGNOSIS — H9193 Unspecified hearing loss, bilateral: Secondary | ICD-10-CM | POA: Insufficient documentation

## 2015-11-10 DIAGNOSIS — L539 Erythematous condition, unspecified: Secondary | ICD-10-CM | POA: Diagnosis not present

## 2015-11-10 DIAGNOSIS — Z87891 Personal history of nicotine dependence: Secondary | ICD-10-CM

## 2015-11-10 DIAGNOSIS — L03211 Cellulitis of face: Secondary | ICD-10-CM | POA: Diagnosis not present

## 2015-11-10 MED ORDER — CIPROFLOXACIN HCL 500 MG PO TABS: 500.0000 mg | ORAL_TABLET | Freq: Two times a day (BID) | ORAL | Status: AC

## 2015-11-10 MED ORDER — PROMETHAZINE HCL 25 MG PO TABS: 25.0000 mg | ORAL_TABLET | Freq: Four times a day (QID) | ORAL | Status: DC | PRN

## 2015-11-10 MED ORDER — HYDROCODONE-ACETAMINOPHEN 7.5-325 MG PO TABS: 1.0000 | ORAL_TABLET | Freq: Four times a day (QID) | ORAL | Status: DC | PRN

## 2015-11-10 NOTE — Assessment & Plan Note (Signed)
HPI: She has chronic bilateral hearing loss, cochlear implants had previously been discussed but she was too nervous,  She recnetly decided she would like to be referred to [facility] audiology program for evaluation and to discuss the implants  A: Chronic bilateral hearing loss  P: Referral to audiology

## 2015-11-10 NOTE — Assessment & Plan Note (Addendum)
A: Cellulitis of face, failure to resolve after extended course of doxycycline  P: -Change antibiotics to Cipro for 1 week to expand coverage to gram negative orgainisms, I discussed with her the allergy to Cipro causeing "fevers" she actually reported that it causes nausea but will be ok if she has phenergan, - I cleaned the area with an alcohol swab and then obtained a superficial swab culture of the area, I was no able to appreciate much drainage and the swab may not be revealing. - I want her to follow up in 1 week to assess the response and determine if longer course is needed. If she gets worse she needs to be seen sooner -Refilled Hydrocodone-APAP for severe pain

## 2015-11-10 NOTE — Progress Notes (Signed)
Medicine attending: I personally interviewed and briefly examined this patient on the day of the patient visit and reviewed pertinent clinical ,laboratory, and radiographic data  with resident physician Dr. Joni Reining and we discussed a management plan. Lady with unexplained cellulitis left malar area with only partial response to clindamycin, then doxycycline, with rapid recurrence when antibiotics stopped. No oropharnygeal pathology. No areas of fluctuance but exquisitely tender. Scant non purulent drainage at superior border.  We will get a routine culture. Begin another course of antibiotics with Cipro to provide gram negative coverage. Referral to surgery if no prompt response.

## 2015-11-10 NOTE — Patient Instructions (Signed)
General Instructions:  I am prescribing Cipro 500mg  twice a day, please take this for one week then come back to be seen.  I want to see you sooner if this gets worse  Please bring your medicines with you each time you come to clinic.  Medicines may include prescription medications, over-the-counter medications, herbal remedies, eye drops, vitamins, or other pills.   Progress Toward Treatment Goals:  No flowsheet data found.  Self Care Goals & Plans:  Self Care Goal 12/11/2012  Manage my medications take my medicines as prescribed; bring my medications to every visit    No flowsheet data found.   Care Management & Community Referrals:  Referral 03/09/2013  Referrals made to community resources none

## 2015-11-10 NOTE — Progress Notes (Signed)
[facility] Subjective:   Patient ID: Donna Davis female   DOB: July 24, 1971 45 y.o.   MRN: [id number]  HPI: Donna Davis is a 45 y.o. female with a PMH detailed below who presents for reevaluation of her facial cellulitis,  Since I last saw her 1 month ago she did take the 10 day course of doxycycline which she reports improved the redness swelling and her vision returned to normal. She followed up with her opthamologist who noted that it did not resolve completely and he have her an additional 5 day course of doxycycline which she notes also helped.  However it still did not resolve so she completed the remainder of the clindamycin that she had from her initial ED visit.  She notes since she finished the antibiotics the area has again started to swelling, redness has started spreading again and her pain has returned.  She does reports some clear discharge from the area but never any purulent discharge.  She denies any fever or chills.  She feels like her vision may be getting worse again.   Past Medical History  Diagnosis Date  . GERD (gastroesophageal reflux disease)   . Hypothyroidism   . IBS (irritable bowel syndrome)   . Anxiety   . Hearing difficulty     b/l, 2/2 congenital rubella s/p stapedectomy. Using hearing aid  . Headache(784.0) 04/2005    h/o headache in past that raised question of psuedotumor s/p therapeutic LP with an opening preassure of 18 cm H2O   . Hepatic cyst     6 mm on CT done done in 05/2005  . Family history of malignant neoplasm of gastrointestinal tract   . Shingles 03/2013   Current Outpatient Prescriptions  Medication Sig Dispense Refill  . acetaminophen (TYLENOL) 500 MG tablet Take 2 tablets (1,000 mg total) by mouth every 8 (eight) hours as needed for moderate pain. 100 tablet 2  . albuterol (PROVENTIL HFA;VENTOLIN HFA) 108 (90 BASE) MCG/ACT inhaler Inhale into the lungs every 6 (six) hours as needed for wheezing or shortness of  breath.    . ALPRAZolam (XANAX) 0.25 MG tablet Take 1 tablet (0.25 mg total) by mouth at bedtime as needed. 30 tablet 5  . Ascorbic Acid (VITAMIN C PO) Take 1 tablet by mouth daily.    . benzonatate (TESSALON) 100 MG capsule Take 1 capsule (100 mg total) by mouth 3 (three) times daily as needed for cough. (Patient not taking: Reported on 01/13/2015) 21 capsule 0  . Cholecalciferol (VITAMIN D PO) Take 1 tablet by mouth daily.    . ciprofloxacin (CIPRO) 500 MG tablet Take 1 tablet (500 mg total) by mouth 2 (two) times daily. 14 tablet 0  . estradiol (ESTRACE) 1 MG tablet Take 1 tablet (1 mg total) by mouth daily. 30 tablet 10  . HYDROcodone-acetaminophen (NORCO) 7.5-325 MG tablet Take 1 tablet by mouth every 6 (six) hours as needed for moderate pain. 30 tablet 0  . ipratropium (ATROVENT) 0.06 % nasal spray Place 2 sprays into both nostrils 4 (four) times daily. For nasal congestion 15 mL 0  . levothyroxine (SYNTHROID, LEVOTHROID) 25 MCG tablet TAKE 1 TABLET BY MOUTH DAILY 31 tablet 11  . ondansetron (ZOFRAN) 8 MG tablet Take 1 tablet (8 mg total) by mouth every 8 (eight) hours as needed for nausea or vomiting. 10 tablet 0  . OVER THE COUNTER MEDICATION Take 1 tablet by mouth daily. SlimQuick    . polyethylene glycol (MIRALAX /  GLYCOLAX) packet Take 17 g by mouth 2 (two) times daily. Take 17g PO BID until you have daily soft stools, then reduce to once daily or every other day to maintain daily soft stools 14 each 0  . promethazine (PHENERGAN) 25 MG tablet Take 1 tablet (25 mg total) by mouth every 6 (six) hours as needed for nausea or vomiting. 30 tablet 0  . traMADol (ULTRAM) 50 MG tablet Take 1 tablet (50 mg total) by mouth every 6 (six) hours as needed. 30 tablet 0   No current facility-administered medications for this visit.   Family History  Problem Relation Age of Onset  . Breast cancer Mother   . Ovarian cancer Mother   . Diabetes Mother   . Stomach cancer Maternal Grandmother   . Colon  cancer Maternal Grandmother   . Diabetes Maternal Grandmother   . Pancreatic cancer Father   . Prostate cancer Father   . Colon cancer Father   . Diabetes Father   . Heart disease Father   . Irritable bowel syndrome Father   . Kidney disease Father   . Heart disease      both grandmothers  . Bipolar disorder Daughter    Social History   Social History  . Marital Status: Married    Spouse Name: N/A  . Number of Children: 2  . Years of Education: N/A   Occupational History  . student    Social History Main Topics  . Smoking status: Former Smoker    Types: Cigarettes    Quit date: 02/18/2008  . Smokeless tobacco: Never Used     Comment: quit 41yrs ago  . Alcohol Use: 0.0 oz/week    0 Standard drinks or equivalent per week     Comment: Rarely.  . Drug Use: No  . Sexual Activity: Not Asked   Other Topics Concern  . None   Social History Narrative   Current smoker, not using alcohol or drugs at this time   Lives with husband and 2 children   Review of Systems: Review of Systems  Constitutional: Negative for fever, chills and malaise/fatigue.  Eyes: Positive for blurred vision.  Respiratory: Negative for cough.   Cardiovascular: Negative for chest pain.  Neurological: Negative for headaches.     Objective:  Physical Exam: Filed Vitals:   11/10/15 1349  BP: 133/87  Pulse: 96  Temp: 98.5 F (36.9 C)  TempSrc: Oral  Height: 5\' 1"  (1.549 m)  Weight: 211 lb 9.6 oz (95.981 kg)  SpO2: 100%  Physical Exam  Constitutional: She is well-developed, well-nourished, and in no distress.  Eyes: Conjunctivae and EOM are normal. Pupils are equal, round, and reactive to light.  Skin:  Erythema of the left aspect of her cheek that is slightly regressed from previous evaluation.  The area is very tender to palpation and indurated without clear fluid pocket.  Nursing note and vitals reviewed.   Assessment & Plan:  Case discussed with and patient seen by Dr.  Beryle Beams  Cellulitis, face A: Cellulitis of face, failure to resolve after extended course of doxycycline  P: -Change antibiotics to Cipro for 1 week to expand coverage to gram negative orgainisms, I discussed with her the allergy to Cipro causeing "fevers" she actually reported that it causes nausea but will be ok if she has phenergan, - I cleaned the area with an alcohol swab and then obtained a superficial swab culture of the area, I was no able to appreciate much drainage and the  swab may not be revealing. - I want her to follow up in 1 week to assess the response and determine if longer course is needed. If she gets worse she needs to be seen sooner -Refilled Hydrocodone-APAP for severe pain   Bilateral hearing loss HPI: She has chronic bilateral hearing loss, cochlear implants had previously been discussed but she was too nervous,  She recnetly decided she would like to be referred to [facility] audiology program for evaluation and to discuss the implants  A: Chronic bilateral hearing loss  P: Referral to audiology   Medications Ordered Meds ordered this encounter  Medications  . HYDROcodone-acetaminophen (NORCO) 7.5-325 MG tablet    Sig: Take 1 tablet by mouth every 6 (six) hours as needed for moderate pain.    Dispense:  30 tablet    Refill:  0  . ciprofloxacin (CIPRO) 500 MG tablet    Sig: Take 1 tablet (500 mg total) by mouth 2 (two) times daily.    Dispense:  14 tablet    Refill:  0  . promethazine (PHENERGAN) 25 MG tablet    Sig: Take 1 tablet (25 mg total) by mouth every 6 (six) hours as needed for nausea or vomiting.    Dispense:  30 tablet    Refill:  0   Other Orders Orders Placed This Encounter  Procedures  . Culture, Wound    Order Specific Question:  Source    Answer:  swab of left cheek  . Ambulatory referral to Audiology    Referral Priority:  Routine    Referral Type:  Audiology Exam    Referral Reason:  Specialty Services Required     Number of Visits Requested:  1   Follow Up: Return in about 1 week (around 11/17/2015).

## 2015-11-13 LAB — WOUND CULTURE: Organism ID, Bacteria: NONE SEEN

## 2015-11-16 ENCOUNTER — Encounter: Payer: Self-pay | Admitting: Internal Medicine

## 2015-11-17 ENCOUNTER — Ambulatory Visit (INDEPENDENT_AMBULATORY_CARE_PROVIDER_SITE_OTHER): Payer: Medicare Other | Admitting: Internal Medicine

## 2015-11-17 ENCOUNTER — Encounter: Payer: Self-pay | Admitting: Internal Medicine

## 2015-11-17 VITALS — BP 139/75 | HR 86 | Temp 98.2°F | Resp 18 | Ht 60.0 in | Wt 209.1 lb

## 2015-11-17 DIAGNOSIS — B9689 Other specified bacterial agents as the cause of diseases classified elsewhere: Secondary | ICD-10-CM | POA: Diagnosis not present

## 2015-11-17 DIAGNOSIS — L03211 Cellulitis of face: Secondary | ICD-10-CM | POA: Diagnosis not present

## 2015-11-17 MED ORDER — CIPROFLOXACIN HCL 500 MG PO TABS: 500.0000 mg | ORAL_TABLET | Freq: Two times a day (BID) | ORAL | Status: AC

## 2015-11-17 NOTE — Progress Notes (Signed)
   Subjective:    Patient ID: Donna Davis, female    DOB: [birth year], 45 y.o.   MRN: [id number]  HPI  45 yo female with b/l hearing loss, anxiety, GERD, IBS, hypothyroidism, obesity, recent facial cellulitis present for cellulitis follow up. Accompanied by her sign language interpretor.   Presented on January with redness and pain on left cheek, along with decreased visual acuity on left eye. Was put on clindamycin, then later was switched to Doxy 10 day course starting 1/19. Was seen for follow up on 2/23. At that visit her cellulitis did not resolve so her abx was switched to Cipro for 1 week to expand coverage for G - organisms.   Today her area of cellulitis has regressed to just upper face. Has some pain and swelling. Has clear drainage (not currently). Has some subjective chills at home, has been taking tylenol.    Review of Systems  Constitutional: Positive for chills. Negative for fever.  HENT: Negative for congestion, sore throat and voice change.   Respiratory: Negative for cough, chest tightness and shortness of breath.   Cardiovascular: Negative for chest pain, palpitations and leg swelling.  Gastrointestinal: Positive for nausea. Negative for vomiting, abdominal pain, diarrhea and abdominal distention.  Endocrine: Negative for polyuria.  Genitourinary: Negative for dysuria and flank pain.  Musculoskeletal: Negative for back pain, arthralgias and neck stiffness.  Skin: Positive for color change and rash.  Neurological: Negative.   Hematological: Negative.   Psychiatric/Behavioral: Negative.        Objective:   Physical Exam  Constitutional: She appears well-developed and well-nourished.  HENT:  Head: Normocephalic and atraumatic.    Her facial cellulitis affected her entire left face in the past. Now it has decreased to only her upper cheek bone area. Has one skin opening without any drainage or fluctuance.   Eyes: Pupils are equal, round, and reactive to light.  Has  some conjuctival injection on left eye medially. EOMI intact. Mild pain with eye movement. Mild light sensitivity.   Neck: Normal range of motion. No JVD present.  Cardiovascular: Normal rate and regular rhythm.  Exam reveals no gallop and no friction rub.   No murmur heard. Pulmonary/Chest: Breath sounds normal. No respiratory distress. She has no wheezes.  Abdominal: Soft. Bowel sounds are normal. She exhibits no distension. There is no tenderness.  Musculoskeletal: Normal range of motion. She exhibits no edema or tenderness.   Filed Vitals:   11/17/15 1409  BP: 139/75  Pulse: 86  Temp: 98.2 F (36.8 C)  Resp: 18        Assessment & Plan:  See problem based a&p.

## 2015-11-17 NOTE — Patient Instructions (Signed)
Continue your cipro for 7 more days = total 14 days. I expect that you will get complete resolution with this.   Please follow up in 1 week. We want to make sure we don't need to pursue further workup, which we would do if you are not improving that visit.  If you develop fever, have other new symptoms please call us or go to the ED.

## 2015-11-17 NOTE — Assessment & Plan Note (Addendum)
Improving with cipro today is 7th day. Will continue more 7 more days for complete resolution. Will f/up in 1 week. If not resolved, may consider CT imaging and blood culture at that point.

## 2015-11-21 NOTE — Progress Notes (Signed)
Internal Medicine Clinic Attending  Case discussed with Dr. Ahmed at the time of the visit.  We reviewed the resident's history and exam and pertinent patient test results.  I agree with the assessment, diagnosis, and plan of care documented in the resident's note. 

## 2015-11-23 ENCOUNTER — Encounter: Payer: Self-pay | Admitting: Internal Medicine

## 2015-11-23 ENCOUNTER — Ambulatory Visit (INDEPENDENT_AMBULATORY_CARE_PROVIDER_SITE_OTHER): Payer: Medicare Other | Admitting: Internal Medicine

## 2015-11-23 VITALS — BP 127/80 | HR 75 | Temp 98.2°F | Ht 60.0 in | Wt 210.2 lb

## 2015-11-23 DIAGNOSIS — L03211 Cellulitis of face: Secondary | ICD-10-CM | POA: Diagnosis not present

## 2015-11-23 DIAGNOSIS — B9689 Other specified bacterial agents as the cause of diseases classified elsewhere: Secondary | ICD-10-CM | POA: Diagnosis not present

## 2015-11-23 NOTE — Assessment & Plan Note (Addendum)
Patient's overall region of erythema has definitely improved from previous encounters, however it's overall unchanged since my last encounter with her on 11/17/2015. Still on cipro, has 2 more days left. Has pain more than expected on exam on her upper left cheek area. No current drainage from this al though patient has some at home.  This could also be 2/2 to shingles as she is having quite a bit of pain still (no active vesicles on exam, the skin is mostly crusted).  -will continue to finish up the cipro for 2 more days. Warm compresses, tylenol+ibuprofen prn pain. If eye pain is worse she needs to go back to Optho, if face is worse then she needs to come back and see [place]. If this worsens again after stopping cipro she needs workup for deeper infection. Patient appears clinically stable so we don't feel the need to admit her for further workup right now.

## 2015-11-23 NOTE — Patient Instructions (Signed)
Finish up taking your cipro.  Continue to keep your face clean. Keep it covered with bandaid. Keep applying warm compresses to help with swelling/pain.  Take tylenol or ibuprofen for pain.  If you develop worsening eye pain please go back to see your eye doctor.  If your skin lesion worsens then please call us.

## 2015-11-23 NOTE — Progress Notes (Signed)
   Subjective:    Patient ID: Donna Davis, female    DOB: Jun 24, 1971, 45 y.o.   MRN: [id number]  HPI  45 yo female with migraine, GERd, IBS, anxiety, obesity, presents for follow up face cellulitis.   This started on January with redness on left cheek along with decreased visual acuity on left eye. Was put on clindamycin then alter switched to doxy 10 day course starting 1/19. Was seen for f/up on 2/23, was chagned to cipro that time for G- coverage. Last visit on 3/2 her area of cellulitis regressed to only upper left face. I continue cipro for 7 more days (total 14 days).  I have seen this patient on 11/17/15 for same problem. She continues to have redness on her left upper cheek with some trouble with eye movement. Has clear drainage occasionally when she washes her face. Still taking the cipro (has 2 more days left). No fever, occasional chills.    Review of Systems  Constitutional: Positive for chills. Negative for fever.  HENT: Negative for congestion, sore throat and voice change.   Respiratory: Negative for cough, chest tightness and shortness of breath.   Cardiovascular: Negative for chest pain, palpitations and leg swelling.  Gastrointestinal: Negative for nausea, vomiting, abdominal pain, diarrhea and abdominal distention.  Endocrine: Negative for polyuria.  Genitourinary: Negative for dysuria and flank pain.  Musculoskeletal: Negative for back pain, arthralgias and neck stiffness.  Skin: Positive for color change and rash.  Neurological: Negative.   Hematological: Negative.   Psychiatric/Behavioral: Negative.        Objective:   Physical Exam  Constitutional: She is oriented to person, place, and time. She appears well-developed and well-nourished.  Interviewed with her sign Nutritional therapist.   HENT:  Head: Normocephalic and atraumatic.  Mouth/Throat: Oropharynx is clear and moist.  Left upper cheek has small ~1cm oval area of erythema with dry crusted skin over it. No  skin opening or drainage currently. Has tenderness to touch over this area.   Eyes: Conjunctivae and EOM are normal. Pupils are equal, round, and reactive to light. Right eye exhibits no discharge. Left eye exhibits no discharge. No scleral icterus.  Mild pain on left eye with eye movement.  Neck: Normal range of motion. No JVD present.  Cardiovascular: Normal rate, regular rhythm and normal heart sounds.  Exam reveals no friction rub.   No murmur heard. Pulmonary/Chest: Effort normal and breath sounds normal. No respiratory distress. She has no wheezes.  Abdominal: Soft. Bowel sounds are normal. She exhibits no distension. There is no tenderness.  Musculoskeletal: Normal range of motion. She exhibits no edema or tenderness.  Lymphadenopathy:    She has no cervical adenopathy.  Neurological: She is alert and oriented to person, place, and time.     Filed Vitals:   11/23/15 1507  BP: 127/80  Pulse: 75  Temp: 98.2 F (36.8 C)        Assessment & Plan:  See problem based a&p

## 2015-11-24 DIAGNOSIS — M9902 Segmental and somatic dysfunction of thoracic region: Secondary | ICD-10-CM | POA: Diagnosis not present

## 2015-11-24 DIAGNOSIS — M9903 Segmental and somatic dysfunction of lumbar region: Secondary | ICD-10-CM | POA: Diagnosis not present

## 2015-11-24 DIAGNOSIS — M5136 Other intervertebral disc degeneration, lumbar region: Secondary | ICD-10-CM | POA: Diagnosis not present

## 2015-11-24 DIAGNOSIS — M9901 Segmental and somatic dysfunction of cervical region: Secondary | ICD-10-CM | POA: Diagnosis not present

## 2015-11-25 NOTE — Progress Notes (Signed)
Internal Medicine Clinic Attending  I saw and evaluated the patient.  I personally confirmed the key portions of the history and exam documented by Dr. Ahmed and I reviewed pertinent patient test results.  The assessment, diagnosis, and plan were formulated together and I agree with the documentation in the resident's note.   

## 2015-11-28 DIAGNOSIS — M5136 Other intervertebral disc degeneration, lumbar region: Secondary | ICD-10-CM | POA: Diagnosis not present

## 2015-11-28 DIAGNOSIS — M9903 Segmental and somatic dysfunction of lumbar region: Secondary | ICD-10-CM | POA: Diagnosis not present

## 2015-11-28 DIAGNOSIS — M9901 Segmental and somatic dysfunction of cervical region: Secondary | ICD-10-CM | POA: Diagnosis not present

## 2015-11-28 DIAGNOSIS — M9902 Segmental and somatic dysfunction of thoracic region: Secondary | ICD-10-CM | POA: Diagnosis not present

## 2015-11-29 ENCOUNTER — Encounter: Payer: Self-pay | Admitting: Internal Medicine

## 2015-12-06 DIAGNOSIS — M9902 Segmental and somatic dysfunction of thoracic region: Secondary | ICD-10-CM | POA: Diagnosis not present

## 2015-12-06 DIAGNOSIS — M9903 Segmental and somatic dysfunction of lumbar region: Secondary | ICD-10-CM | POA: Diagnosis not present

## 2015-12-06 DIAGNOSIS — M5136 Other intervertebral disc degeneration, lumbar region: Secondary | ICD-10-CM | POA: Diagnosis not present

## 2015-12-06 DIAGNOSIS — M9901 Segmental and somatic dysfunction of cervical region: Secondary | ICD-10-CM | POA: Diagnosis not present

## 2015-12-09 ENCOUNTER — Other Ambulatory Visit: Payer: Self-pay | Admitting: Internal Medicine

## 2015-12-09 DIAGNOSIS — S2000XA Contusion of breast, unspecified breast, initial encounter: Secondary | ICD-10-CM

## 2015-12-09 DIAGNOSIS — N6489 Other specified disorders of breast: Secondary | ICD-10-CM

## 2015-12-09 MED ORDER — PROMETHAZINE HCL 25 MG PO TABS: 25.0000 mg | ORAL_TABLET | Freq: Four times a day (QID) | ORAL | Status: DC | PRN

## 2015-12-09 MED ORDER — TRAMADOL HCL 50 MG PO TABS: 50.0000 mg | ORAL_TABLET | Freq: Four times a day (QID) | ORAL | Status: DC | PRN

## 2015-12-09 NOTE — Telephone Encounter (Signed)
Called to pharm 

## 2015-12-14 DIAGNOSIS — M9903 Segmental and somatic dysfunction of lumbar region: Secondary | ICD-10-CM | POA: Diagnosis not present

## 2015-12-14 DIAGNOSIS — M9901 Segmental and somatic dysfunction of cervical region: Secondary | ICD-10-CM | POA: Diagnosis not present

## 2015-12-14 DIAGNOSIS — M5136 Other intervertebral disc degeneration, lumbar region: Secondary | ICD-10-CM | POA: Diagnosis not present

## 2015-12-14 DIAGNOSIS — M9902 Segmental and somatic dysfunction of thoracic region: Secondary | ICD-10-CM | POA: Diagnosis not present

## 2015-12-15 DIAGNOSIS — H903 Sensorineural hearing loss, bilateral: Secondary | ICD-10-CM | POA: Diagnosis not present

## 2015-12-19 ENCOUNTER — Encounter (HOSPITAL_COMMUNITY): Payer: Self-pay | Admitting: *Deleted

## 2015-12-19 ENCOUNTER — Emergency Department (INDEPENDENT_AMBULATORY_CARE_PROVIDER_SITE_OTHER)
Admission: EM | Admit: 2015-12-19 | Discharge: 2015-12-19 | Disposition: A | Discharge status: Home / Self Care | Payer: Medicare Other | Source: Home / Self Care | Attending: Family Medicine | Admitting: Family Medicine

## 2015-12-19 DIAGNOSIS — J111 Influenza due to unidentified influenza virus with other respiratory manifestations: Secondary | ICD-10-CM

## 2015-12-19 DIAGNOSIS — R69 Illness, unspecified: Principal | ICD-10-CM

## 2015-12-19 MED ORDER — ONDANSETRON HCL 4 MG PO TABS
4.0000 mg | ORAL_TABLET | Freq: Four times a day (QID) | ORAL | Status: DC
Start: 2015-12-19 — End: 2016-10-10

## 2015-12-19 MED ORDER — ONDANSETRON 4 MG PO TBDP
ORAL_TABLET | ORAL | Status: AC
  Filled 2015-12-19: qty 1

## 2015-12-19 MED ORDER — ONDANSETRON 4 MG PO TBDP
4.0000 mg | ORAL_TABLET | Freq: Once | ORAL | Status: AC
  Administered 2015-12-19: 4 mg via ORAL

## 2015-12-19 NOTE — ED Notes (Signed)
Pt  Is  Deaf    Interpretor  Is  Present    Pt  Reports  Symptoms  Of  Body  Aches   sorethroat  And  Headache     With  Nausea  /  Ear  Pain         Symptoms  acually  Began  Yesterday  Worse  Today       pt is  Masked   And  Is in a  Private  Room       husband  At  Bedside

## 2015-12-19 NOTE — ED Provider Notes (Signed)
CSN: [id number]     Arrival date & time 12/19/15  1834 History   First MD Initiated Contact with Patient 12/19/15 1922     Chief Complaint  Patient presents with  . Generalized Body Aches   (Consider location/radiation/quality/duration/timing/severity/associated sxs/prior Treatment) Patient is a 45 y.o. female presenting with flu symptoms. The history is provided by the patient. The history is limited by a language barrier. A language interpreter was used.  Influenza Presenting symptoms: fever, myalgias, nausea and sore throat   Severity:  Mild Onset quality:  Sudden Duration:  2 days Progression:  Unchanged Chronicity:  New Relieved by:  None tried Worsened by:  Nothing tried Ineffective treatments:  None tried Associated symptoms: chills, decreased appetite, decreased physical activity, ear pain and nasal congestion   Risk factors: sick contacts     Past Medical History  Diagnosis Date  . GERD (gastroesophageal reflux disease)   . Hypothyroidism   . IBS (irritable bowel syndrome)   . Anxiety   . Hearing difficulty     b/l, 2/2 congenital rubella s/p stapedectomy. Using hearing aid  . Headache(784.0) 04/2005    h/o headache in past that raised question of psuedotumor s/p therapeutic LP with an opening preassure of 18 cm H2O   . Hepatic cyst     6 mm on CT done done in 05/2005  . Family history of malignant neoplasm of gastrointestinal tract   . Shingles 03/2013   Past Surgical History  Procedure Laterality Date  . Total abdominal hysterectomy  2005    Endometriosis with residual right ovary. Multiple GYN surgeries for chronic pelvic pain; had LSO in past  . Cesarean section  1994  . Stapedectomy  2007    Dr. Lucia Gaskins, left ear  . Laparoscopic ovarian cystectomy  2001  . Thyroid nodule removed  1997  . Abdominal adhesion surgery      removal from bowel  . Joint replacement    . Cholecystectomy N/A 06/26/2013    Procedure: LAPAROSCOPIC CHOLECYSTECTOMY WITH ATTEMPTED  INTRAOPERATIVE CHOLANGIOGRAM;  Surgeon: Harl Bowie, MD;  Location: [facility];  Service: General;  Laterality: N/A;  . Salpingoophorectomy Left    Family History  Problem Relation Age of Onset  . Breast cancer Mother   . Ovarian cancer Mother   . Diabetes Mother   . Stomach cancer Maternal Grandmother   . Colon cancer Maternal Grandmother   . Diabetes Maternal Grandmother   . Pancreatic cancer Father   . Prostate cancer Father   . Colon cancer Father   . Diabetes Father   . Heart disease Father   . Irritable bowel syndrome Father   . Kidney disease Father   . Heart disease      both grandmothers  . Bipolar disorder Daughter    Social History  Substance Use Topics  . Smoking status: Former Smoker    Types: Cigarettes    Quit date: 02/18/2008  . Smokeless tobacco: Never Used     Comment: quit 53yrs ago  . Alcohol Use: 0.0 oz/week    0 Standard drinks or equivalent per week     Comment: Rarely.   OB History    Gravida Para Term Preterm AB TAB SAB Ectopic Multiple Living   3 2 2  1 1    2      Review of Systems  Constitutional: Positive for fever, chills and decreased appetite.  HENT: Positive for congestion, ear pain and sore throat.   Cardiovascular: Negative.   Gastrointestinal: Positive for  nausea.  Musculoskeletal: Positive for myalgias.  Skin: Negative.   All other systems reviewed and are negative.   Allergies  Onion; Shellfish allergy; Amitriptyline; Amoxicillin-pot clavulanate; Cefuroxime; Cephalexin; Ciprofloxacin; Clarithromycin; Doxycycline; Propranolol; Sulfonamide derivatives; Benadryl; and Shrimp flavor  Home Medications   Prior to Admission medications   Medication Sig Start Date End Date Taking? Authorizing Provider  acetaminophen (TYLENOL) 500 MG tablet Take 2 tablets (1,000 mg total) by mouth every 8 (eight) hours as needed for moderate pain. 08/26/15 08/25/16  Loleta Chance, MD  albuterol (PROVENTIL HFA;VENTOLIN HFA) 108 (90 BASE) MCG/ACT inhaler  Inhale into the lungs every 6 (six) hours as needed for wheezing or shortness of breath.    Historical Provider, MD  ALPRAZolam Duanne Moron) 0.25 MG tablet Take 1 tablet (0.25 mg total) by mouth at bedtime as needed. 09/22/15   Sid Falcon, MD  Ascorbic Acid (VITAMIN C PO) Take 1 tablet by mouth daily.    Historical Provider, MD  benzonatate (TESSALON) 100 MG capsule Take 1 capsule (100 mg total) by mouth 3 (three) times daily as needed for cough. Patient not taking: Reported on 01/13/2015 01/12/15   Lutricia Feil, PA  Cholecalciferol (VITAMIN D PO) Take 1 tablet by mouth daily.    Historical Provider, MD  estradiol (ESTRACE) 1 MG tablet Take 1 tablet (1 mg total) by mouth daily. 06/09/15   Osborne Oman, MD  HYDROcodone-acetaminophen (NORCO) 7.5-325 MG tablet Take 1 tablet by mouth every 6 (six) hours as needed for moderate pain. 11/10/15   Lucious Groves, DO  ipratropium (ATROVENT) 0.06 % nasal spray Place 2 sprays into both nostrils 4 (four) times daily. For nasal congestion 01/12/15   Lutricia Feil, PA  levothyroxine (SYNTHROID, LEVOTHROID) 25 MCG tablet TAKE 1 TABLET BY MOUTH DAILY 04/11/15   Sid Falcon, MD  ondansetron (ZOFRAN) 4 MG tablet Take 1 tablet (4 mg total) by mouth every 6 (six) hours. 12/19/15   Billy Fischer, MD  OVER THE COUNTER MEDICATION Take 1 tablet by mouth daily. Bartholomew Provider, MD  polyethylene glycol (MIRALAX / GLYCOLAX) packet Take 17 g by mouth 2 (two) times daily. Take 17g PO BID until you have daily soft stools, then reduce to once daily or every other day to maintain daily soft stools 06/12/15   Mercedes Camprubi-Soms, PA-C  promethazine (PHENERGAN) 25 MG tablet Take 1 tablet (25 mg total) by mouth every 6 (six) hours as needed for nausea or vomiting. 12/09/15   Sid Falcon, MD  traMADol (ULTRAM) 50 MG tablet Take 1 tablet (50 mg total) by mouth every 6 (six) hours as needed. 12/09/15 12/07/16  Sid Falcon, MD   Meds Ordered and  Administered this Visit   Medications  ondansetron (ZOFRAN-ODT) disintegrating tablet 4 mg (4 mg Oral Given 12/19/15 2004)    BP 158/92 mmHg  Pulse 81  Temp(Src) 98.5 F (36.9 C) (Oral)  Resp 16  SpO2 98%  LMP 12/26/2004 No data found.   Physical Exam  Constitutional: She is oriented to person, place, and time. She appears well-developed and well-nourished. No distress.  HENT:  Head: Normocephalic.  Right Ear: External ear normal.  Left Ear: External ear normal.  Mouth/Throat: Oropharynx is clear and moist.  Eyes: Conjunctivae are normal. Pupils are equal, round, and reactive to light.  Neck: Normal range of motion. Neck supple.  Cardiovascular: Normal heart sounds and intact distal pulses.   Pulmonary/Chest: Effort normal and breath sounds normal.  Abdominal: Soft. Bowel sounds are normal.  Lymphadenopathy:    She has no cervical adenopathy.  Neurological: She is alert and oriented to person, place, and time.  Skin: Skin is warm and dry.  Nursing note and vitals reviewed.   ED Course  Procedures (including critical care time)  Labs Review Labs Reviewed - No data to display  Imaging Review No results found.   Visual Acuity Review  Right Eye Distance:   Left Eye Distance:   Bilateral Distance:    Right Eye Near:   Left Eye Near:    Bilateral Near:         MDM   1. Influenza-like illness        Billy Fischer, MD 12/19/15 2126

## 2015-12-23 ENCOUNTER — Encounter: Payer: Self-pay | Admitting: Internal Medicine

## 2015-12-23 ENCOUNTER — Ambulatory Visit (INDEPENDENT_AMBULATORY_CARE_PROVIDER_SITE_OTHER): Payer: Medicare Other | Admitting: Internal Medicine

## 2015-12-23 VITALS — BP 149/90 | HR 92 | Temp 98.2°F | Ht 61.0 in | Wt 204.4 lb

## 2015-12-23 DIAGNOSIS — M9902 Segmental and somatic dysfunction of thoracic region: Secondary | ICD-10-CM | POA: Diagnosis not present

## 2015-12-23 DIAGNOSIS — M9903 Segmental and somatic dysfunction of lumbar region: Secondary | ICD-10-CM | POA: Diagnosis not present

## 2015-12-23 DIAGNOSIS — M9901 Segmental and somatic dysfunction of cervical region: Secondary | ICD-10-CM | POA: Diagnosis not present

## 2015-12-23 DIAGNOSIS — M5136 Other intervertebral disc degeneration, lumbar region: Secondary | ICD-10-CM | POA: Diagnosis not present

## 2015-12-23 DIAGNOSIS — R1110 Vomiting, unspecified: Secondary | ICD-10-CM | POA: Diagnosis not present

## 2015-12-23 DIAGNOSIS — R197 Diarrhea, unspecified: Secondary | ICD-10-CM

## 2015-12-23 DIAGNOSIS — K529 Noninfective gastroenteritis and colitis, unspecified: Secondary | ICD-10-CM | POA: Insufficient documentation

## 2015-12-23 LAB — INFLUENZA PANEL BY PCR (TYPE A & B)
H1N1 flu by pcr: NOT DETECTED
INFLAPCR: NEGATIVE
Influenza B By PCR: NEGATIVE

## 2015-12-23 LAB — C DIFFICILE QUICK SCREEN W PCR REFLEX
C DIFFICILE (CDIFF) INTERP: NEGATIVE
C DIFFICILE (CDIFF) TOXIN: NEGATIVE
C Diff antigen: NEGATIVE

## 2015-12-23 MED ORDER — METRONIDAZOLE 500 MG PO TABS: 500.0000 mg | ORAL_TABLET | Freq: Three times a day (TID) | ORAL | Status: AC

## 2015-12-23 NOTE — Patient Instructions (Addendum)
Start taking flagyl 500mg  three times a day for your diarrhea. If your symptoms worsen and you are unable to stay hydrated go to the emergency department.   Take tylenol as needed for fevers.   I will call you Monday once the results have come back and to see how you are going.

## 2015-12-26 NOTE — Progress Notes (Signed)
Subjective:   Patient ID: Donna Davis female   DOB: 05-14-1971 45 y.o.   MRN: [id number]  HPI: [name] is a 45 y.o. with past medical history as outlined below who presents to clinic for  Please see problem list for status of the pt's chronic medical problems.  Past Medical History  Diagnosis Date  . GERD (gastroesophageal reflux disease)   . Hypothyroidism   . IBS (irritable bowel syndrome)   . Anxiety   . Hearing difficulty     b/l, 2/2 congenital rubella s/p stapedectomy. Using hearing aid  . Headache(784.0) 04/2005    h/o headache in past that raised question of psuedotumor s/p therapeutic LP with an opening preassure of 18 cm H2O   . Hepatic cyst     6 mm on CT done done in 05/2005  . Family history of malignant neoplasm of gastrointestinal tract   . Shingles 03/2013   Current Outpatient Prescriptions  Medication Sig Dispense Refill  . acetaminophen (TYLENOL) 500 MG tablet Take 2 tablets (1,000 mg total) by mouth every 8 (eight) hours as needed for moderate pain. 100 tablet 2  . albuterol (PROVENTIL HFA;VENTOLIN HFA) 108 (90 BASE) MCG/ACT inhaler Inhale into the lungs every 6 (six) hours as needed for wheezing or shortness of breath.    . ALPRAZolam (XANAX) 0.25 MG tablet Take 1 tablet (0.25 mg total) by mouth at bedtime as needed. 30 tablet 5  . Ascorbic Acid (VITAMIN C PO) Take 1 tablet by mouth daily.    . benzonatate (TESSALON) 100 MG capsule Take 1 capsule (100 mg total) by mouth 3 (three) times daily as needed for cough. (Patient not taking: Reported on 01/13/2015) 21 capsule 0  . Cholecalciferol (VITAMIN D PO) Take 1 tablet by mouth daily.    Marland Kitchen estradiol (ESTRACE) 1 MG tablet Take 1 tablet (1 mg total) by mouth daily. 30 tablet 10  . HYDROcodone-acetaminophen (NORCO) 7.5-325 MG tablet Take 1 tablet by mouth every 6 (six) hours as needed for moderate pain. 30 tablet 0  . ipratropium (ATROVENT) 0.06 % nasal spray Place 2 sprays into both nostrils 4 (four) times  daily. For nasal congestion 15 mL 0  . levothyroxine (SYNTHROID, LEVOTHROID) 25 MCG tablet TAKE 1 TABLET BY MOUTH DAILY 31 tablet 11  . metroNIDAZOLE (FLAGYL) 500 MG tablet Take 1 tablet (500 mg total) by mouth 3 (three) times daily. 30 tablet 0  . ondansetron (ZOFRAN) 4 MG tablet Take 1 tablet (4 mg total) by mouth every 6 (six) hours. 6 tablet 0  . OVER THE COUNTER MEDICATION Take 1 tablet by mouth daily. SlimQuick    . polyethylene glycol (MIRALAX / GLYCOLAX) packet Take 17 g by mouth 2 (two) times daily. Take 17g PO BID until you have daily soft stools, then reduce to once daily or every other day to maintain daily soft stools 14 each 0  . promethazine (PHENERGAN) 25 MG tablet Take 1 tablet (25 mg total) by mouth every 6 (six) hours as needed for nausea or vomiting. 30 tablet 0  . traMADol (ULTRAM) 50 MG tablet Take 1 tablet (50 mg total) by mouth every 6 (six) hours as needed. 30 tablet 1   No current facility-administered medications for this visit.   Family History  Problem Relation Age of Onset  . Breast cancer Mother   . Ovarian cancer Mother   . Diabetes Mother   . Stomach cancer Maternal Grandmother   . Colon cancer Maternal Grandmother   .  Diabetes Maternal Grandmother   . Pancreatic cancer Father   . Prostate cancer Father   . Colon cancer Father   . Diabetes Father   . Heart disease Father   . Irritable bowel syndrome Father   . Kidney disease Father   . Heart disease      both grandmothers  . Bipolar disorder Daughter    Social History   Social History  . Marital Status: Married    Spouse Name: N/A  . Number of Children: 2  . Years of Education: N/A   Occupational History  . student    Social History Main Topics  . Smoking status: Former Smoker    Types: Cigarettes    Quit date: 02/18/2008  . Smokeless tobacco: Never Used     Comment: quit 68yrs ago  . Alcohol Use: 0.0 oz/week    0 Standard drinks or equivalent per week     Comment: Rarely.  . Drug Use:  No  . Sexual Activity: Not Asked   Other Topics Concern  . None   Social History Narrative   Current smoker, not using alcohol or drugs at this time   Lives with husband and 2 children   Review of Systems: Review of Systems  Constitutional: Positive for fever, chills and malaise/fatigue.  HENT: Negative for congestion and sore throat.   Respiratory: Negative for cough.   Cardiovascular: Negative for chest pain.  Gastrointestinal: Positive for nausea, vomiting, abdominal pain and diarrhea (5 BMs yesterday, 4 this morning). Negative for constipation, blood in stool and melena.  Skin: Negative for itching and rash.  Neurological: Negative for weakness.    Objective:  Physical Exam: Filed Vitals:   12/23/15 1344  BP: 149/90  Pulse: 92  Temp: 98.2 F (36.8 C)  TempSrc: Oral  Height: 5\' 1"  (1.549 m)  Weight: 204 lb 6.4 oz (92.715 kg)  SpO2: 99%   Physical Exam  Constitutional: She appears well-developed and well-nourished.  HENT:  Head: Normocephalic and atraumatic.  Mouth/Throat: Oropharynx is clear and moist. No oropharyngeal exudate.  Eyes: Conjunctivae and EOM are normal. Left eye exhibits no discharge.  Cardiovascular: Normal rate, regular rhythm and normal heart sounds.   Pulmonary/Chest: Effort normal and breath sounds normal. No respiratory distress. She has no wheezes. She has no rales.  Abdominal: Soft. Bowel sounds are normal. She exhibits no distension. There is tenderness (generalized). There is no rebound and no guarding.  Skin: Skin is warm and dry. No rash noted. She is not diaphoretic. No erythema. No pallor.    Assessment & Plan:   Please see problem based assessment and plan.

## 2015-12-26 NOTE — Assessment & Plan Note (Signed)
Pt has not been feeling well since Monday. She was seen in the ED for this and was told she had the flu and sent home with supportive treatment. Since MOnday pt states her sorethroat has resolved but she now has diarrhea and vomiting that started yesterday. She had 5 BMs yesterday and 4 today that are soft and watery. She has been vomiting since yesterday and will vomit whatever she eats or drinks but has been able to stay hydrated w/ water and gaterade. 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 She has been tx w/mutliple abx -- clindamycin, doxy, and cipro for a face cellulitis, last abx use was 3/10. She endorses fevers but is afebrile here and at her ED visit on Monday. Possible etiologies include c. Diff, influenza related illness, and viral gastroenteritis.   - given course of flagyl 500mg  TID x 10 d - c.diff came back negative - flu negative - called pt today 4/10 at 4:50pm, left message to call clinic if her sx have not improved, informed her of above results.

## 2015-12-27 NOTE — Progress Notes (Signed)
Case discussed with Dr. Truong at the time of the visit.  We reviewed the resident's history and exam and pertinent patient test results.  I agree with the assessment, diagnosis, and plan of care documented in the resident's note. 

## 2016-01-12 DIAGNOSIS — G44209 Tension-type headache, unspecified, not intractable: Secondary | ICD-10-CM | POA: Diagnosis not present

## 2016-01-12 DIAGNOSIS — M9901 Segmental and somatic dysfunction of cervical region: Secondary | ICD-10-CM | POA: Diagnosis not present

## 2016-01-12 DIAGNOSIS — M9902 Segmental and somatic dysfunction of thoracic region: Secondary | ICD-10-CM | POA: Diagnosis not present

## 2016-01-12 DIAGNOSIS — M5031 Other cervical disc degeneration,  high cervical region: Secondary | ICD-10-CM | POA: Diagnosis not present

## 2016-01-17 DIAGNOSIS — H9193 Unspecified hearing loss, bilateral: Secondary | ICD-10-CM | POA: Insufficient documentation

## 2016-01-17 DIAGNOSIS — H903 Sensorineural hearing loss, bilateral: Secondary | ICD-10-CM | POA: Diagnosis not present

## 2016-01-17 HISTORY — DX: Unspecified hearing loss, bilateral: H91.93

## 2016-01-18 DIAGNOSIS — M9902 Segmental and somatic dysfunction of thoracic region: Secondary | ICD-10-CM | POA: Diagnosis not present

## 2016-01-18 DIAGNOSIS — M5031 Other cervical disc degeneration,  high cervical region: Secondary | ICD-10-CM | POA: Diagnosis not present

## 2016-01-18 DIAGNOSIS — M9901 Segmental and somatic dysfunction of cervical region: Secondary | ICD-10-CM | POA: Diagnosis not present

## 2016-01-18 DIAGNOSIS — G44209 Tension-type headache, unspecified, not intractable: Secondary | ICD-10-CM | POA: Diagnosis not present

## 2016-02-02 DIAGNOSIS — S99921A Unspecified injury of right foot, initial encounter: Secondary | ICD-10-CM | POA: Diagnosis not present

## 2016-02-02 DIAGNOSIS — M25561 Pain in right knee: Secondary | ICD-10-CM | POA: Diagnosis not present

## 2016-02-02 DIAGNOSIS — S8991XA Unspecified injury of right lower leg, initial encounter: Secondary | ICD-10-CM | POA: Diagnosis not present

## 2016-02-02 DIAGNOSIS — S93602A Unspecified sprain of left foot, initial encounter: Secondary | ICD-10-CM | POA: Diagnosis not present

## 2016-02-02 DIAGNOSIS — S8391XA Sprain of unspecified site of right knee, initial encounter: Secondary | ICD-10-CM | POA: Diagnosis not present

## 2016-02-02 DIAGNOSIS — R030 Elevated blood-pressure reading, without diagnosis of hypertension: Secondary | ICD-10-CM | POA: Diagnosis not present

## 2016-02-02 DIAGNOSIS — M79672 Pain in left foot: Secondary | ICD-10-CM | POA: Diagnosis not present

## 2016-02-02 DIAGNOSIS — R269 Unspecified abnormalities of gait and mobility: Secondary | ICD-10-CM | POA: Diagnosis not present

## 2016-02-08 DIAGNOSIS — G44209 Tension-type headache, unspecified, not intractable: Secondary | ICD-10-CM | POA: Diagnosis not present

## 2016-02-08 DIAGNOSIS — M9901 Segmental and somatic dysfunction of cervical region: Secondary | ICD-10-CM | POA: Diagnosis not present

## 2016-02-08 DIAGNOSIS — M5031 Other cervical disc degeneration,  high cervical region: Secondary | ICD-10-CM | POA: Diagnosis not present

## 2016-02-08 DIAGNOSIS — M9902 Segmental and somatic dysfunction of thoracic region: Secondary | ICD-10-CM | POA: Diagnosis not present

## 2016-02-10 ENCOUNTER — Other Ambulatory Visit: Payer: Self-pay | Admitting: Internal Medicine

## 2016-02-10 NOTE — Telephone Encounter (Signed)
Called pharm

## 2016-02-21 DIAGNOSIS — H9193 Unspecified hearing loss, bilateral: Secondary | ICD-10-CM | POA: Diagnosis not present

## 2016-03-02 DIAGNOSIS — H903 Sensorineural hearing loss, bilateral: Secondary | ICD-10-CM | POA: Diagnosis not present

## 2016-03-04 ENCOUNTER — Encounter (HOSPITAL_COMMUNITY): Payer: Self-pay | Admitting: Emergency Medicine

## 2016-03-04 ENCOUNTER — Emergency Department (HOSPITAL_COMMUNITY)
Admission: EM | Admit: 2016-03-04 | Discharge: 2016-03-04 | Disposition: A | Discharge status: Home / Self Care | Payer: Medicare Other | Attending: Emergency Medicine | Admitting: Emergency Medicine

## 2016-03-04 ENCOUNTER — Other Ambulatory Visit: Payer: Self-pay

## 2016-03-04 ENCOUNTER — Emergency Department (HOSPITAL_COMMUNITY): Payer: Medicare Other

## 2016-03-04 DIAGNOSIS — E039 Hypothyroidism, unspecified: Secondary | ICD-10-CM | POA: Diagnosis not present

## 2016-03-04 DIAGNOSIS — R1013 Epigastric pain: Secondary | ICD-10-CM | POA: Diagnosis not present

## 2016-03-04 DIAGNOSIS — Z9660 Presence of unspecified orthopedic joint implant: Secondary | ICD-10-CM | POA: Diagnosis not present

## 2016-03-04 DIAGNOSIS — Z79899 Other long term (current) drug therapy: Secondary | ICD-10-CM | POA: Insufficient documentation

## 2016-03-04 DIAGNOSIS — R079 Chest pain, unspecified: Secondary | ICD-10-CM | POA: Diagnosis present

## 2016-03-04 DIAGNOSIS — Z87891 Personal history of nicotine dependence: Secondary | ICD-10-CM | POA: Diagnosis not present

## 2016-03-04 DIAGNOSIS — R112 Nausea with vomiting, unspecified: Secondary | ICD-10-CM | POA: Insufficient documentation

## 2016-03-04 DIAGNOSIS — R197 Diarrhea, unspecified: Secondary | ICD-10-CM | POA: Diagnosis not present

## 2016-03-04 DIAGNOSIS — R1084 Generalized abdominal pain: Secondary | ICD-10-CM | POA: Diagnosis not present

## 2016-03-04 DIAGNOSIS — R0789 Other chest pain: Secondary | ICD-10-CM | POA: Diagnosis not present

## 2016-03-04 LAB — LIPASE, BLOOD: LIPASE: 30 U/L (ref 11–51)

## 2016-03-04 LAB — COMPREHENSIVE METABOLIC PANEL
ALBUMIN: 3.7 g/dL (ref 3.5–5.0)
ALK PHOS: 71 U/L (ref 38–126)
ALT: 21 U/L (ref 14–54)
ANION GAP: 10 (ref 5–15)
AST: 24 U/L (ref 15–41)
BUN: 11 mg/dL (ref 6–20)
CALCIUM: 9.4 mg/dL (ref 8.9–10.3)
CHLORIDE: 105 mmol/L (ref 101–111)
CO2: 22 mmol/L (ref 22–32)
Creatinine, Ser: 0.68 mg/dL (ref 0.44–1.00)
GFR calc Af Amer: >60 mL/min (ref >60)
GFR calc non Af Amer: >60 mL/min (ref >60)
GLUCOSE: 92 mg/dL (ref 65–99)
Potassium: 3.9 mmol/L (ref 3.5–5.1)
SODIUM: 137 mmol/L (ref 135–145)
Total Bilirubin: 0.5 mg/dL (ref 0.3–1.2)
Total Protein: 7.2 g/dL (ref 6.5–8.1)

## 2016-03-04 LAB — URINALYSIS, ROUTINE W REFLEX MICROSCOPIC
Bilirubin Urine: NEGATIVE
Glucose, UA: NEGATIVE mg/dL
Hgb urine dipstick: NEGATIVE
Ketones, ur: NEGATIVE mg/dL
Leukocytes, UA: NEGATIVE
Nitrite: NEGATIVE
PROTEIN: NEGATIVE mg/dL
SPECIFIC GRAVITY, URINE: 1.02 (ref 1.005–1.030)
pH: 5.5 (ref 5.0–8.0)

## 2016-03-04 LAB — CBC
HCT: 47 % — ABNORMAL HIGH (ref 36.0–46.0)
HEMOGLOBIN: 15.5 g/dL — AB (ref 12.0–15.0)
MCH: 30 pg (ref 26.0–34.0)
MCHC: 33 g/dL (ref 30.0–36.0)
MCV: 90.9 fL (ref 78.0–100.0)
Platelets: 239 10*3/uL (ref 150–400)
RBC: 5.17 MIL/uL — ABNORMAL HIGH (ref 3.87–5.11)
RDW: 12.4 % (ref 11.5–15.5)
WBC: 8.3 10*3/uL (ref 4.0–10.5)

## 2016-03-04 LAB — I-STAT TROPONIN, ED: TROPONIN I, POC: 0 ng/mL (ref 0.00–0.08)

## 2016-03-04 LAB — I-STAT BETA HCG BLOOD, ED (MC, WL, AP ONLY)

## 2016-03-04 MED ORDER — GI COCKTAIL ~~LOC~~
30.0000 mL | Freq: Once | ORAL | Status: AC
  Administered 2016-03-04: 30 mL via ORAL
  Filled 2016-03-04: qty 30

## 2016-03-04 MED ORDER — MORPHINE SULFATE (PF) 4 MG/ML IV SOLN
8.0000 mg | Freq: Once | INTRAVENOUS | Status: AC
  Administered 2016-03-04: 8 mg via INTRAVENOUS
  Filled 2016-03-04: qty 2

## 2016-03-04 MED ORDER — OMEPRAZOLE 20 MG PO CPDR: 20.0000 mg | DELAYED_RELEASE_CAPSULE | Freq: Every day | ORAL | Status: DC

## 2016-03-04 MED ORDER — SODIUM CHLORIDE 0.9 % IV BOLUS (SEPSIS)
1000.0000 mL | Freq: Once | INTRAVENOUS | Status: AC
  Administered 2016-03-04: 1000 mL via INTRAVENOUS

## 2016-03-04 MED ORDER — ONDANSETRON HCL 4 MG PO TABS: 4.0000 mg | ORAL_TABLET | Freq: Three times a day (TID) | ORAL | Status: DC | PRN

## 2016-03-04 MED ORDER — IOPAMIDOL (ISOVUE-300) INJECTION 61%
INTRAVENOUS | Status: AC
  Administered 2016-03-04: 100 mL
  Filled 2016-03-04: qty 100

## 2016-03-04 MED ORDER — ONDANSETRON HCL 4 MG/2ML IJ SOLN
4.0000 mg | Freq: Once | INTRAMUSCULAR | Status: AC
  Administered 2016-03-04: 4 mg via INTRAVENOUS
  Filled 2016-03-04: qty 2

## 2016-03-04 MED ORDER — HYDROCODONE-ACETAMINOPHEN 5-325 MG PO TABS: 1.0000 | ORAL_TABLET | Freq: Four times a day (QID) | ORAL | Status: DC | PRN

## 2016-03-04 NOTE — ED Provider Notes (Signed)
CSN: [id number]     Arrival date & time 03/04/16  1514 History   First MD Initiated Contact with Patient 03/04/16 1609     Chief Complaint  Patient presents with  . Chest Pain  . Emesis     (Consider location/radiation/quality/duration/timing/severity/associated sxs/prior Treatment) HPI 45 year old female who presents with nausea, vomiting, diarrhea and chest pain. States that about one week ago she had generalized abdominal discomfort, resolved on its own and she was doing well yesterday. This morning woke up at 5 AM with severe generalized abdominal pain with subsequent nonbloody, nonbilious emesis and nonbloody diarrhea. Vomiting has improved, but with persistent watery stools and persistent abdominal pain. Pain localized to the epigastrium and to umbilical abdomen and radiates into the left lower chest. Subjective fevers and chills. Denies dysuria, urinary frequency, sick contacts, recent antibiotics. History of cholecystectomy, total abdominal hysterectomy with left salpingo-oophorectomy. Past Medical History  Diagnosis Date  . GERD (gastroesophageal reflux disease)   . Hypothyroidism   . IBS (irritable bowel syndrome)   . Anxiety   . Hearing difficulty     b/l, 2/2 congenital rubella s/p stapedectomy. Using hearing aid  . Headache(784.0) 04/2005    h/o headache in past that raised question of psuedotumor s/p therapeutic LP with an opening preassure of 18 cm H2O   . Hepatic cyst     6 mm on CT done done in 05/2005  . Family history of malignant neoplasm of gastrointestinal tract   . Shingles 03/2013   Past Surgical History  Procedure Laterality Date  . Total abdominal hysterectomy  2005    Endometriosis with residual right ovary. Multiple GYN surgeries for chronic pelvic pain; had LSO in past  . Cesarean section  1994  . Stapedectomy  2007    Dr. Lucia Gaskins, left ear  . Laparoscopic ovarian cystectomy  2001  . Thyroid nodule removed  1997  . Abdominal adhesion surgery      removal  from bowel  . Joint replacement    . Cholecystectomy N/A 06/26/2013    Procedure: LAPAROSCOPIC CHOLECYSTECTOMY WITH ATTEMPTED INTRAOPERATIVE CHOLANGIOGRAM;  Surgeon: Harl Bowie, MD;  Location: [facility];  Service: General;  Laterality: N/A;  . Salpingoophorectomy Left    Family History  Problem Relation Age of Onset  . Breast cancer Mother   . Ovarian cancer Mother   . Diabetes Mother   . Stomach cancer Maternal Grandmother   . Colon cancer Maternal Grandmother   . Diabetes Maternal Grandmother   . Pancreatic cancer Father   . Prostate cancer Father   . Colon cancer Father   . Diabetes Father   . Heart disease Father   . Irritable bowel syndrome Father   . Kidney disease Father   . Heart disease      both grandmothers  . Bipolar disorder Daughter    Social History  Substance Use Topics  . Smoking status: Former Smoker    Types: Cigarettes    Quit date: 02/18/2008  . Smokeless tobacco: Never Used     Comment: quit 72yrs ago  . Alcohol Use: 0.0 oz/week    0 Standard drinks or equivalent per week     Comment: Rarely.   OB History    Gravida Para Term Preterm AB TAB SAB Ectopic Multiple Living   3 2 2  1 1    2      Review of Systems 10/14 systems reviewed and are negative other than those stated in the HPI    Allergies  Onion;  Other; Shellfish allergy; Amitriptyline; Amoxicillin-pot clavulanate; Amoxicillin-pot clavulanate; Cefuroxime; Cephalexin; Ciprofloxacin; Clarithromycin; Clarithromycin; Doxycycline; Propranolol; Sulfonamide derivatives; Sulfa antibiotics; Benadryl; and Shrimp flavor  Home Medications   Prior to Admission medications   Medication Sig Start Date End Date Taking? Authorizing Provider  albuterol (PROVENTIL HFA;VENTOLIN HFA) 108 (90 BASE) MCG/ACT inhaler Inhale into the lungs every 6 (six) hours as needed for wheezing or shortness of breath.   Yes Historical Provider, MD  ALPRAZolam (XANAX) 0.25 MG tablet Take 1 tablet (0.25 mg total) by mouth  at bedtime as needed. Patient taking differently: Take 0.25 mg by mouth at bedtime.  09/22/15  Yes Sid Falcon, MD  Ascorbic Acid (VITAMIN C PO) Take 1 tablet by mouth daily.   Yes Historical Provider, MD  busPIRone (BUSPAR) 10 MG tablet Take 5-20 mg by mouth 3 (three) times daily. Takes 1 tab in am, 1/2 tab at lunch ad 1-2 tabs at bedtime   Yes Historical Provider, MD  CALCIUM PO Take 1 tablet by mouth daily.   Yes Historical Provider, MD  Cholecalciferol (VITAMIN D PO) Take 1 tablet by mouth daily.   Yes Historical Provider, MD  estradiol (ESTRACE) 1 MG tablet Take 1 tablet (1 mg total) by mouth daily. 06/09/15  Yes Osborne Oman, MD  levothyroxine (SYNTHROID, LEVOTHROID) 25 MCG tablet TAKE 1 TABLET BY MOUTH DAILY 04/11/15  Yes Sid Falcon, MD  Omega-3 Fatty Acids (FISH OIL) 1000 MG CAPS Take 1,000 mg by mouth daily.   Yes Historical Provider, MD  traMADol (ULTRAM) 50 MG tablet TAKE 1 TABLET EVERY 6 HOURS AS NEEDED (MUST LAST 30 DAYS) 02/10/16  Yes Sid Falcon, MD  acetaminophen (TYLENOL) 500 MG tablet Take 2 tablets (1,000 mg total) by mouth every 8 (eight) hours as needed for moderate pain. 08/26/15 08/25/16  Loleta Chance, MD  benzonatate (TESSALON) 100 MG capsule Take 1 capsule (100 mg total) by mouth 3 (three) times daily as needed for cough. Patient not taking: Reported on 01/13/2015 01/12/15   Lutricia Feil, PA  HYDROcodone-acetaminophen (NORCO) 7.5-325 MG tablet Take 1 tablet by mouth every 6 (six) hours as needed for moderate pain. 11/10/15   Lucious Groves, DO  HYDROcodone-acetaminophen (NORCO/VICODIN) 5-325 MG tablet Take 1-2 tablets by mouth every 6 (six) hours as needed for moderate pain or severe pain. 03/04/16   Forde Dandy, MD  ipratropium (ATROVENT) 0.06 % nasal spray Place 2 sprays into both nostrils 4 (four) times daily. For nasal congestion 01/12/15   Audelia Hives Presson, PA  omeprazole (PRILOSEC) 20 MG capsule Take 1 capsule (20 mg total) by mouth daily. 03/04/16    Forde Dandy, MD  ondansetron (ZOFRAN) 4 MG tablet Take 1 tablet (4 mg total) by mouth every 6 (six) hours. 12/19/15   Billy Fischer, MD  ondansetron (ZOFRAN) 4 MG tablet Take 1 tablet (4 mg total) by mouth every 8 (eight) hours as needed for nausea or vomiting. 03/04/16   Forde Dandy, MD  polyethylene glycol [facility] / Floria Raveling) packet Take 17 g by mouth 2 (two) times daily. Take 17g PO BID until you have daily soft stools, then reduce to once daily or every other day to maintain daily soft stools 06/12/15   Mercedes Camprubi-Soms, PA-C  promethazine (PHENERGAN) 25 MG tablet Take 1 tablet (25 mg total) by mouth every 6 (six) hours as needed for nausea or vomiting. 12/09/15   Sid Falcon, MD   BP 135/72 mmHg  Pulse 96  Temp(Src) 98.2 F (36.8 C) (Oral)  Resp 18  SpO2 97%  LMP 12/26/2004 Physical Exam Physical Exam  Nursing note and vitals reviewed. Constitutional: Appears uncomfortable, non-toxic, and in no acute distress Head: Normocephalic and atraumatic.  Mouth/Throat: Oropharynx is clear and dry mucous membranes.  Neck: Normal range of motion. Neck supple.  Cardiovascular: Tachycardic rate and regular rhythm.   Pulmonary/Chest: Effort normal and breath sounds normal.  Abdominal: Soft. There is periumbilical and epigastric abdominal tenderness. There is no rebound and no guarding.  Musculoskeletal: Normal range of motion.  Neurological: Alert, no facial droop, fluent speech, moves all extremities symmetrically Skin: Skin is warm and dry.  Psychiatric: Cooperative  ED Course  Procedures (including critical care time) Labs Review Labs Reviewed  CBC - Abnormal; Notable for the following:    RBC 5.17 (*)    Hemoglobin 15.5 (*)    HCT 47.0 (*)    All other components within normal limits  LIPASE, BLOOD  COMPREHENSIVE METABOLIC PANEL  URINALYSIS, ROUTINE W REFLEX MICROSCOPIC (NOT AT [facility])  I-STAT TROPOININ, ED  I-STAT BETA HCG BLOOD, ED ([facility], [facility], [facility] ONLY)    Imaging Review Dg  Chest 2 View  03/04/2016  CLINICAL DATA:  Severe generalized abdominal pain beginning this morning radiating to LEFT lower chest. EXAM: CHEST  2 VIEW COMPARISON:  Chest radiograph November 11, 2014 FINDINGS: Cardiomediastinal silhouette is normal. Mild bronchitic changes. The lungs are clear without pleural effusions or focal consolidations. Trachea projects midline and there is no pneumothorax. Soft tissue planes and included osseous structures are non-suspicious. Surgical clips in the included right abdomen compatible with cholecystectomy. IMPRESSION: Mild bronchitic changes. Electronically Signed   By: Elon Alas M.D.   On: 03/04/2016 18:54   Ct Abdomen Pelvis W Contrast  03/04/2016  CLINICAL DATA:  Initial evaluation for acute generalized abdominal pain. EXAM: CT ABDOMEN AND PELVIS WITH CONTRAST TECHNIQUE: Multidetector CT imaging of the abdomen and pelvis was performed using the standard protocol following bolus administration of intravenous contrast. CONTRAST:  163mL ISOVUE-300 IOPAMIDOL (ISOVUE-300) INJECTION 61% COMPARISON:  Prior CT from 06/12/2015. FINDINGS: Scattered atelectatic changes present within the visualized lung bases. Visualized lungs are otherwise clear. No pleural or pericardial fusion. Liver demonstrates a normal contrast enhanced appearance. Gallbladder surgically absent. Mild intra and extrahepatic biliary dilatation, like related to post cholecystectomy changes. Spleen, adrenal glands, and pancreas demonstrate a normal contrast enhanced appearance. Kidneys are equal in size with symmetric enhancement. Few scattered subcentimeter hypodensities within the upper pole the right kidney are too small the characterize, but statistically likely reflects small cyst. Kidneys are otherwise unremarkable without evidence nephrolithiasis, hydronephrosis, or focal enhancing renal mass. Stomach within normal limits. No evidence for bowel obstruction. No abnormal wall thickening, mucosal  enhancement, or inflammatory fat stranding seen about the bowels. Appendix visualized in the right lower quadrant and is of normal caliber and appearance associated inflammatory changes to suggest acute appendicitis. Bladder partially distended with mild circumferential wall thickening, likely related incomplete distension. Uterus is absent.  Ovaries not discretely identified. No free air or fluid. No pathologically enlarged intra-abdominal or pelvic lymph nodes identified. Few scattered subcentimeter mesenteric nodes noted within the right lower quadrant. Normal intravascular enhancement seen throughout the intra-abdominal aorta and its branch vessels. No acute osseous abnormality. No worrisome lytic or blastic osseous lesions. IMPRESSION: 1. No CT evidence for acute intra-abdominal or pelvic process. 2. Status post cholecystectomy and hysterectomy. Electronically Signed   By: Jeannine Boga M.D.   On: 03/04/2016 21:29  I have personally reviewed and evaluated these images and lab results as part of my medical decision-making.   EKG Interpretation Donna Davis      MDM   Final diagnoses:  Nausea vomiting and diarrhea  Epigastric abdominal pain    45 year old female with abdominal pain, nausea, vomiting, and diarrhea for 1 day. Presentation is nontoxic and in no acute distress but appears uncomfortable. With periumbilical and epigastric tenderness to palpation, but abdomen is soft and non-peritoneal. CBC, comprehensive metabolic panel, lipase, pregnancy and urinalysis are overall unremarkable. CT shows no acute intraabdominal processes. Suspect benign GI illness. Given antiemetics, analgesics and GI cocktail. She will continue outpatient supportive care at home with PCP follow-up. Prescribed analgesics, antiemetics, and prilosec for home. Strict return and follow-up instructions reviewed. She expressed understanding of all discharge instructions and felt comfortable with the plan of  care.     Forde Dandy, MD 03/04/16 [phone]

## 2016-03-04 NOTE — ED Notes (Signed)
Pt here for CP and N/V/D starting today

## 2016-03-04 NOTE — Discharge Instructions (Signed)
Your CT scan of the abdomen was normal today. Your blood work also was reassuring.  Please follow-up with your PCP in 2-3 days for recheck.  Return to for worsening symptoms, including fever, worsening pain, vomiting and unable to keep down fluids despite nausea medications, or any other symptoms concerning to you.  Abdominal Pain, Adult Many things can cause abdominal pain. Usually, abdominal pain is not caused by a disease and will improve without treatment. It can often be observed and treated at home. Your health care provider will do a physical exam and possibly order blood tests and X-rays to help determine the seriousness of your pain. However, in many cases, more time must pass before a clear cause of the pain can be found. Before that point, your health care provider may not know if you need more testing or further treatment. HOME CARE INSTRUCTIONS Monitor your abdominal pain for any changes. The following actions may help to alleviate any discomfort you are experiencing:  Only take over-the-counter or prescription medicines as directed by your health care provider.  Do not take laxatives unless directed to do so by your health care provider.  Try a clear liquid diet (broth, tea, or water) as directed by your health care provider. Slowly move to a bland diet as tolerated. SEEK MEDICAL CARE IF:  You have unexplained abdominal pain.  You have abdominal pain associated with nausea or diarrhea.  You have pain when you urinate or have a bowel movement.  You experience abdominal pain that wakes you in the night.  You have abdominal pain that is worsened or improved by eating food.  You have abdominal pain that is worsened with eating fatty foods.  You have a fever. SEEK IMMEDIATE MEDICAL CARE IF:  Your pain does not go away within 2 hours.  You keep throwing up (vomiting).  Your pain is felt only in portions of the abdomen, such as the right side or the left lower portion of  the abdomen.  You pass bloody or black tarry stools. MAKE SURE YOU:  Understand these instructions.  Will watch your condition.  Will get help right away if you are not doing well or get worse.   This information is not intended to replace advice given to you by your health care provider. Make sure you discuss any questions you have with your health care provider.   Document Released: 06/13/2005 Document Revised: 05/25/2015 Document Reviewed: 05/13/2013 Elsevier Interactive Patient Education [employer].

## 2016-03-06 DIAGNOSIS — M9902 Segmental and somatic dysfunction of thoracic region: Secondary | ICD-10-CM | POA: Diagnosis not present

## 2016-03-06 DIAGNOSIS — M5031 Other cervical disc degeneration,  high cervical region: Secondary | ICD-10-CM | POA: Diagnosis not present

## 2016-03-06 DIAGNOSIS — G44209 Tension-type headache, unspecified, not intractable: Secondary | ICD-10-CM | POA: Diagnosis not present

## 2016-03-06 DIAGNOSIS — M9901 Segmental and somatic dysfunction of cervical region: Secondary | ICD-10-CM | POA: Diagnosis not present

## 2016-03-07 ENCOUNTER — Encounter: Payer: Self-pay | Admitting: Internal Medicine

## 2016-03-07 ENCOUNTER — Ambulatory Visit (INDEPENDENT_AMBULATORY_CARE_PROVIDER_SITE_OTHER): Payer: Medicare Other | Admitting: Internal Medicine

## 2016-03-07 VITALS — BP 128/64 | HR 81 | Temp 98.0°F | Ht 61.0 in | Wt 201.5 lb

## 2016-03-07 DIAGNOSIS — K529 Noninfective gastroenteritis and colitis, unspecified: Secondary | ICD-10-CM

## 2016-03-07 DIAGNOSIS — R197 Diarrhea, unspecified: Secondary | ICD-10-CM

## 2016-03-07 MED ORDER — LOPERAMIDE HCL 2 MG PO CAPS: ORAL_CAPSULE | ORAL | Status: DC

## 2016-03-07 MED ORDER — HYDROCODONE-ACETAMINOPHEN 5-325 MG PO TABS: 1.0000 | ORAL_TABLET | Freq: Four times a day (QID) | ORAL | Status: DC | PRN

## 2016-03-07 NOTE — Progress Notes (Signed)
   Subjective:    Patient ID: Donna Davis, female    DOB: April 19, 1971, 45 y.o.   MRN: [id number]  HPI  45 yo F with hx of migraine, GERD, IBS, hypothyroidism, b/l hearing loss, anxiety, b/l hearing loss, presents with abdominal pain.  Was here on 12/23/15 with diarrhea, cdiff neg, was given course of flagyl 500mg  tid x10 days.   Went to ED on 6/18 for nausea, vomitting NB/NB, watery non bloody diarrhea, and periumbilical/epigastrain pain. Had CT abd/pelvis which was negative, UA neg, iSTAT trop neg,  Lipase, CMET, and CBC negative, Was given antiemetics, pain medication, and prilosec, and was discharged from ED.   Still having diarrhea which is green in color, no blood. She actually wakes up from sleep because she has to go to the bathroom at night time. Had eaten out at a restaurant before Sunday (had salad and grilled chicken, husband had samething and did not get sick). No recent travel or sick contacts, did not buy any unpasteurized products. No recent abx. Had 5x diarrhea yesterday, 2 since this morning. Still having some nausea but better. Still having periumbilical abd pain but improving slightly.   Review of Systems  Constitutional: Positive for chills. Negative for fever.  HENT: Negative for congestion and sore throat.   Respiratory: Negative for chest tightness and shortness of breath.   Cardiovascular: Negative for chest pain and palpitations.  Gastrointestinal: Positive for nausea, abdominal pain and diarrhea. Negative for vomiting and abdominal distention.  Genitourinary: Negative for dysuria and hematuria.  Musculoskeletal: Negative for back pain and arthralgias.  Neurological: Negative for dizziness and numbness.       Objective:   Physical Exam  Constitutional: She is oriented to person, place, and time. She appears well-developed and well-nourished.  Used sign Nutritional therapist. Has b/l hearing loss  Cardiovascular: Normal rate and regular rhythm.  Exam reveals no  friction rub.   No murmur heard. Pulmonary/Chest: Effort normal and breath sounds normal. No respiratory distress. She has no wheezes.  Abdominal: Soft. Bowel sounds are normal.  Had tenderness to palpation on the periumbilical region with some guarding. No rebound tenderness. No distension. Good BS.   Neurological: She is alert and oriented to person, place, and time.     Filed Vitals:   03/07/16 1013  BP: 128/64  Pulse: 81  Temp: 98 F (36.7 C)        Assessment & Plan:  See problem based a&p.

## 2016-03-07 NOTE — Patient Instructions (Signed)
I think your diarrhea is due to an infectious cause.  We will treat you with imodium. Take 2 tablets now, then take 1 tablet every time you have a diarrhea episode.  If you are not better by next Sunday please call us back.

## 2016-03-07 NOTE — Assessment & Plan Note (Addendum)
Having green colored diarrhea since Sunday, today is day 4. Also had n/v with it. No recent abx (last abx was March 2017, had diarrhea after that but Cdiff was neg that time). No other risk factors. Had good workup already at the ED including CBc, cmet, lipase, ct abdomen/pelvis which were all negative. She is not febrile.  This could be viral or bacterial. In either case, should be self limited and should get better in 1 week.  - will prescribe imodium as suspicion for cdiff is low. If not getting better in 3 more days, will have her come back and check for cdiff that time. Obviously, asked to come back if she is getting worse with abd pain or diarrhea or having fevers.  - gave 10 tabs of norco to help with the pain.

## 2016-03-08 NOTE — Progress Notes (Signed)
Internal Medicine Clinic Attending  Case discussed with Dr. Ahmed at the time of the visit.  We reviewed the resident's history and exam and pertinent patient test results.  I agree with the assessment, diagnosis, and plan of care documented in the resident's note. 

## 2016-03-13 DIAGNOSIS — H9193 Unspecified hearing loss, bilateral: Secondary | ICD-10-CM | POA: Diagnosis not present

## 2016-03-15 ENCOUNTER — Encounter: Payer: Self-pay | Admitting: Internal Medicine

## 2016-03-16 ENCOUNTER — Telehealth: Payer: Self-pay | Admitting: *Deleted

## 2016-03-16 NOTE — Telephone Encounter (Signed)
-----   Message from Sid Falcon, MD sent at 03/16/2016  2:25 PM EDT ----- Regarding: Needs Prevnar 13 Ms. Gratton needs the Prevnar 13 in the next week or so.  Could someone contact her (she is deaf and needs to be contacted by My Chart) and set up an appointment for her to come in and have that done.    Thanks!

## 2016-03-16 NOTE — Telephone Encounter (Signed)
Called pt via sign language interp line - appt scheduled July 5th @ 1100 AM.

## 2016-03-19 NOTE — Telephone Encounter (Signed)
Thank you :)

## 2016-03-21 ENCOUNTER — Encounter: Payer: Self-pay | Admitting: Internal Medicine

## 2016-03-21 ENCOUNTER — Other Ambulatory Visit: Payer: Medicare Other

## 2016-03-21 ENCOUNTER — Ambulatory Visit (INDEPENDENT_AMBULATORY_CARE_PROVIDER_SITE_OTHER): Payer: Medicare Other | Admitting: *Deleted

## 2016-03-21 DIAGNOSIS — Z23 Encounter for immunization: Secondary | ICD-10-CM

## 2016-03-21 MED ORDER — PNEUMOCOCCAL 13-VAL CONJ VACC IM SUSP
0.5000 mL | Freq: Once | INTRAMUSCULAR | Status: AC
  Administered 2016-03-21: 0.5 mL via INTRAMUSCULAR

## 2016-03-21 MED ORDER — PNEUMOCOCCAL 13-VAL CONJ VACC IM SUSP: 0.5000 mL | INTRAMUSCULAR | Status: DC

## 2016-03-22 ENCOUNTER — Encounter: Payer: Self-pay | Admitting: Internal Medicine

## 2016-03-22 ENCOUNTER — Ambulatory Visit (INDEPENDENT_AMBULATORY_CARE_PROVIDER_SITE_OTHER): Payer: Medicare Other | Admitting: Internal Medicine

## 2016-03-22 VITALS — BP 141/82 | HR 83 | Temp 98.8°F | Ht 63.0 in | Wt 201.2 lb

## 2016-03-22 DIAGNOSIS — L989 Disorder of the skin and subcutaneous tissue, unspecified: Secondary | ICD-10-CM | POA: Diagnosis not present

## 2016-03-22 DIAGNOSIS — W57XXXA Bitten or stung by nonvenomous insect and other nonvenomous arthropods, initial encounter: Secondary | ICD-10-CM | POA: Insufficient documentation

## 2016-03-22 MED ORDER — CETIRIZINE HCL 10 MG PO TABS: 10.0000 mg | ORAL_TABLET | Freq: Every day | ORAL | Status: DC

## 2016-03-22 NOTE — Progress Notes (Signed)
CC: Itching Bites HPI: [name] is a 46 y.o. female with a h/o of Migraine headaches, GERD, IBS, Bilateral hearing loss and chronic abdominal pain who presents with a 2 week history of pruritic lesions on her legs. She first noticed them after doing yard work about 2 weeks ago. She says that they itch constantly and have not hurt until recently. She has tried topical benadryl, rubbing alcohol, apple cider vinegar and cortisol cream without any relief of her symptoms. She has no visible lesions on her feet or her palms. She denies fevers, chills, night sweats, joint and muscle aches, n/v and abdominal pain. Additionally, she denies shortness of breath, chest pain, or troubles using the restroom.  The lesions look very similar to bug bites either mosquitos or chiggers. This would also be consistent with her work outside and the bites are only visible in areas that were exposed (from her sock line to her shorts). She has no bits elsewhere and no rashes. She has not noticed a tick or seen a tick in the area she has been working. There are no lesions or rashes consistent with tick borne illness.   She has no ather acute concerns or complaints at today's visit.       Past Medical History  Diagnosis Date  . GERD (gastroesophageal reflux disease)   . Hypothyroidism   . IBS (irritable bowel syndrome)   . Anxiety   . Hearing difficulty     b/l, 2/2 congenital rubella s/p stapedectomy. Using hearing aid  . Headache(784.0) 04/2005    h/o headache in past that raised question of psuedotumor s/p therapeutic LP with an opening preassure of 18 cm H2O   . Hepatic cyst     6 mm on CT done done in 05/2005  . Family history of malignant neoplasm of gastrointestinal tract   . Shingles 03/2013   Current Outpatient Rx  Name  Route  Sig  Dispense  Refill  . acetaminophen (TYLENOL) 500 MG tablet   Oral   Take 2 tablets (1,000 mg total) by mouth every 8 (eight) hours as needed for moderate pain.   100  tablet   2   . albuterol (PROVENTIL HFA;VENTOLIN HFA) 108 (90 BASE) MCG/ACT inhaler   Inhalation   Inhale into the lungs every 6 (six) hours as needed for wheezing or shortness of breath.         . ALPRAZolam (XANAX) 0.25 MG tablet   Oral   Take 1 tablet (0.25 mg total) by mouth at bedtime as needed. Patient taking differently: Take 0.25 mg by mouth at bedtime.    30 tablet   5     Not to exceed 5 additional fills before 11/20/2015   . Ascorbic Acid (VITAMIN C PO)   Oral   Take 1 tablet by mouth daily.         . benzonatate (TESSALON) 100 MG capsule   Oral   Take 1 capsule (100 mg total) by mouth 3 (three) times daily as needed for cough. Patient not taking: Reported on 01/13/2015   21 capsule   0   . busPIRone (BUSPAR) 10 MG tablet   Oral   Take 5-20 mg by mouth 3 (three) times daily. Takes 1 tab in am, 1/2 tab at lunch ad 1-2 tabs at bedtime         . CALCIUM PO   Oral   Take 1 tablet by mouth daily.         Marland Kitchen  cetirizine (ZYRTEC) 10 MG tablet   Oral   Take 1 tablet (10 mg total) by mouth daily.   30 tablet   0   . Cholecalciferol (VITAMIN D PO)   Oral   Take 1 tablet by mouth daily.         Marland Kitchen estradiol (ESTRACE) 1 MG tablet   Oral   Take 1 tablet (1 mg total) by mouth daily.   30 tablet   10   . HYDROcodone-acetaminophen (NORCO) 7.5-325 MG tablet   Oral   Take 1 tablet by mouth every 6 (six) hours as needed for moderate pain.   30 tablet   0   . HYDROcodone-acetaminophen (NORCO/VICODIN) 5-325 MG tablet   Oral   Take 1-2 tablets by mouth every 6 (six) hours as needed for moderate pain or severe pain.   10 tablet   0   . ipratropium (ATROVENT) 0.06 % nasal spray   Each Nare   Place 2 sprays into both nostrils 4 (four) times daily. For nasal congestion   15 mL   0   . levothyroxine (SYNTHROID, LEVOTHROID) 25 MCG tablet      TAKE 1 TABLET BY MOUTH DAILY   31 tablet   11   . loperamide (IMODIUM A-D) 2 MG capsule      Take 2 tablets  now. Then take 1 tablet as needed every time you have an episode of diarrhea.   30 capsule   0   . Omega-3 Fatty Acids (FISH OIL) 1000 MG CAPS   Oral   Take 1,000 mg by mouth daily.         Marland Kitchen omeprazole (PRILOSEC) 20 MG capsule   Oral   Take 1 capsule (20 mg total) by mouth daily.   60 capsule   0   . ondansetron (ZOFRAN) 4 MG tablet   Oral   Take 1 tablet (4 mg total) by mouth every 6 (six) hours.   6 tablet   0   . ondansetron (ZOFRAN) 4 MG tablet   Oral   Take 1 tablet (4 mg total) by mouth every 8 (eight) hours as needed for nausea or vomiting.   12 tablet   0   . polyethylene glycol (MIRALAX / GLYCOLAX) packet   Oral   Take 17 g by mouth 2 (two) times daily. Take 17g PO BID until you have daily soft stools, then reduce to once daily or every other day to maintain daily soft stools   14 each   0   . promethazine (PHENERGAN) 25 MG tablet   Oral   Take 1 tablet (25 mg total) by mouth every 6 (six) hours as needed for nausea or vomiting.   30 tablet   0   . traMADol (ULTRAM) 50 MG tablet      TAKE 1 TABLET EVERY 6 HOURS AS NEEDED (MUST LAST 30 DAYS)   30 tablet   1     Not to exceed 5 additional fills before 06/06/2016 ...     Review of Systems: A complete ROS was negative except as per HPI.  Physical Exam: Filed Vitals:   03/22/16 1526  BP: 141/82  Pulse: 83  Temp: 98.8 F (37.1 C)  TempSrc: Oral  Height: 5\' 3"  (1.6 m)  Weight: 201 lb 3.2 oz (91.264 kg)  SpO2: 99%   HEENT: Normocephalic, atraumatic  General appearance: alert, cooperative and appears stated age Lungs: clear to auscultation bilaterally Heart: regular rate and rhythm, S1, S2 normal,  no murmur, click, rub or gallop Abdomen: soft, non-tender; bowel sounds normal; no masses,  no organomegaly Derm: Multiple red lesions on her lower legs bilaterally. The lesions appear as bug bites that have been scratched. There are no signs of infection or rashes. There is no surrounding erythema. The  lesions are not warm to touch  Assessment & Plan:  See encounters tab for problem based medical decision making. Patient seen with Dr. Dareen Piano  Signed: Ophelia Shoulder, MD 03/22/2016, 4:12 PM  Pager: [phone]

## 2016-03-22 NOTE — Assessment & Plan Note (Signed)
The patient has multiple red lesions on her lower legs bilaterally that appear as bug bites. She had outdoor exposure at the time she first noticed them. They are very pruritic with no associated symptoms. These most likely represent some type of insect bite that will continue to improve with supportive management -- Will start cetirizine 10mg  at night for pruritis -- Will use Calamine lotion for pruritis -- Discussed to return to clinic if rash develops or symptoms worsen

## 2016-03-22 NOTE — Patient Instructions (Signed)
It was a pleasure seeing you today. Thank you for choosing [facility] for your healthcare needs.  -- Apply calamine lotion as needed for itching -- Take 1 cetirizine at night for itching -- If rash develops make an appointment for [place] to see you again

## 2016-03-23 ENCOUNTER — Other Ambulatory Visit: Payer: Self-pay | Admitting: *Deleted

## 2016-03-23 MED ORDER — ALPRAZOLAM 0.25 MG PO TABS: 0.2500 mg | ORAL_TABLET | Freq: Every evening | ORAL | Status: DC | PRN

## 2016-03-23 NOTE — Telephone Encounter (Signed)
Called to pharm 

## 2016-03-26 NOTE — Progress Notes (Signed)
Internal Medicine Clinic Attending  I saw and evaluated the patient.  I personally confirmed the key portions of the history and exam documented by Dr. Taylor and I reviewed pertinent patient test results.  The assessment, diagnosis, and plan were formulated together and I agree with the documentation in the resident's note.  

## 2016-03-28 DIAGNOSIS — M5031 Other cervical disc degeneration,  high cervical region: Secondary | ICD-10-CM | POA: Diagnosis not present

## 2016-03-28 DIAGNOSIS — M9902 Segmental and somatic dysfunction of thoracic region: Secondary | ICD-10-CM | POA: Diagnosis not present

## 2016-03-28 DIAGNOSIS — M9901 Segmental and somatic dysfunction of cervical region: Secondary | ICD-10-CM | POA: Diagnosis not present

## 2016-03-28 DIAGNOSIS — G44209 Tension-type headache, unspecified, not intractable: Secondary | ICD-10-CM | POA: Diagnosis not present

## 2016-03-30 DIAGNOSIS — F40240 Claustrophobia: Secondary | ICD-10-CM | POA: Diagnosis not present

## 2016-03-30 DIAGNOSIS — H838X2 Other specified diseases of left inner ear: Secondary | ICD-10-CM | POA: Diagnosis not present

## 2016-03-30 DIAGNOSIS — K219 Gastro-esophageal reflux disease without esophagitis: Secondary | ICD-10-CM | POA: Diagnosis not present

## 2016-03-30 DIAGNOSIS — E039 Hypothyroidism, unspecified: Secondary | ICD-10-CM | POA: Diagnosis not present

## 2016-03-30 DIAGNOSIS — Z9889 Other specified postprocedural states: Secondary | ICD-10-CM | POA: Diagnosis not present

## 2016-03-30 DIAGNOSIS — E669 Obesity, unspecified: Secondary | ICD-10-CM | POA: Diagnosis not present

## 2016-03-30 DIAGNOSIS — G43909 Migraine, unspecified, not intractable, without status migrainosus: Secondary | ICD-10-CM | POA: Diagnosis not present

## 2016-03-30 DIAGNOSIS — Z882 Allergy status to sulfonamides status: Secondary | ICD-10-CM | POA: Diagnosis not present

## 2016-03-30 DIAGNOSIS — H9191 Unspecified hearing loss, right ear: Secondary | ICD-10-CM | POA: Diagnosis not present

## 2016-03-30 DIAGNOSIS — Q165 Congenital malformation of inner ear: Secondary | ICD-10-CM | POA: Diagnosis not present

## 2016-03-30 DIAGNOSIS — Z79899 Other long term (current) drug therapy: Secondary | ICD-10-CM | POA: Diagnosis not present

## 2016-03-30 DIAGNOSIS — H903 Sensorineural hearing loss, bilateral: Secondary | ICD-10-CM | POA: Diagnosis not present

## 2016-03-30 DIAGNOSIS — Z87891 Personal history of nicotine dependence: Secondary | ICD-10-CM | POA: Diagnosis not present

## 2016-03-30 DIAGNOSIS — Z880 Allergy status to penicillin: Secondary | ICD-10-CM | POA: Diagnosis not present

## 2016-03-30 DIAGNOSIS — Z6836 Body mass index (BMI) 36.0-36.9, adult: Secondary | ICD-10-CM | POA: Diagnosis not present

## 2016-03-30 DIAGNOSIS — Z888 Allergy status to other drugs, medicaments and biological substances status: Secondary | ICD-10-CM | POA: Diagnosis not present

## 2016-03-30 DIAGNOSIS — Z881 Allergy status to other antibiotic agents status: Secondary | ICD-10-CM | POA: Diagnosis not present

## 2016-03-30 HISTORY — PX: COCHLEAR IMPLANT: SHX184

## 2016-04-11 DIAGNOSIS — G44209 Tension-type headache, unspecified, not intractable: Secondary | ICD-10-CM | POA: Diagnosis not present

## 2016-04-11 DIAGNOSIS — M9901 Segmental and somatic dysfunction of cervical region: Secondary | ICD-10-CM | POA: Diagnosis not present

## 2016-04-11 DIAGNOSIS — M5031 Other cervical disc degeneration,  high cervical region: Secondary | ICD-10-CM | POA: Diagnosis not present

## 2016-04-18 ENCOUNTER — Other Ambulatory Visit: Payer: Self-pay | Admitting: Internal Medicine

## 2016-04-18 DIAGNOSIS — M9902 Segmental and somatic dysfunction of thoracic region: Secondary | ICD-10-CM | POA: Diagnosis not present

## 2016-04-18 DIAGNOSIS — M9903 Segmental and somatic dysfunction of lumbar region: Secondary | ICD-10-CM | POA: Diagnosis not present

## 2016-04-18 DIAGNOSIS — M5136 Other intervertebral disc degeneration, lumbar region: Secondary | ICD-10-CM | POA: Diagnosis not present

## 2016-04-18 DIAGNOSIS — M9901 Segmental and somatic dysfunction of cervical region: Secondary | ICD-10-CM | POA: Diagnosis not present

## 2016-04-18 NOTE — Telephone Encounter (Signed)
Last appt was 03/22/16.  No f/u scheduled.

## 2016-04-20 ENCOUNTER — Other Ambulatory Visit: Payer: Self-pay | Admitting: Internal Medicine

## 2016-04-20 ENCOUNTER — Other Ambulatory Visit: Payer: Self-pay | Admitting: *Deleted

## 2016-04-20 ENCOUNTER — Encounter: Payer: Self-pay | Admitting: Internal Medicine

## 2016-04-20 DIAGNOSIS — M5136 Other intervertebral disc degeneration, lumbar region: Secondary | ICD-10-CM | POA: Diagnosis not present

## 2016-04-20 DIAGNOSIS — M9903 Segmental and somatic dysfunction of lumbar region: Secondary | ICD-10-CM | POA: Diagnosis not present

## 2016-04-20 DIAGNOSIS — M9902 Segmental and somatic dysfunction of thoracic region: Secondary | ICD-10-CM | POA: Diagnosis not present

## 2016-04-20 DIAGNOSIS — M9901 Segmental and somatic dysfunction of cervical region: Secondary | ICD-10-CM | POA: Diagnosis not present

## 2016-04-20 DIAGNOSIS — W57XXXA Bitten or stung by nonvenomous insect and other nonvenomous arthropods, initial encounter: Secondary | ICD-10-CM

## 2016-04-20 MED ORDER — TRAMADOL HCL 50 MG PO TABS: ORAL_TABLET | ORAL | 1 refills | Status: DC

## 2016-04-20 MED ORDER — CETIRIZINE HCL 10 MG PO TABS: 10.0000 mg | ORAL_TABLET | Freq: Every day | ORAL | 0 refills | Status: DC

## 2016-04-24 DIAGNOSIS — H9193 Unspecified hearing loss, bilateral: Secondary | ICD-10-CM | POA: Diagnosis not present

## 2016-04-26 DIAGNOSIS — M9901 Segmental and somatic dysfunction of cervical region: Secondary | ICD-10-CM | POA: Diagnosis not present

## 2016-04-26 DIAGNOSIS — M9903 Segmental and somatic dysfunction of lumbar region: Secondary | ICD-10-CM | POA: Diagnosis not present

## 2016-04-26 DIAGNOSIS — M5136 Other intervertebral disc degeneration, lumbar region: Secondary | ICD-10-CM | POA: Diagnosis not present

## 2016-04-26 DIAGNOSIS — M9902 Segmental and somatic dysfunction of thoracic region: Secondary | ICD-10-CM | POA: Diagnosis not present

## 2016-04-30 DIAGNOSIS — M5136 Other intervertebral disc degeneration, lumbar region: Secondary | ICD-10-CM | POA: Diagnosis not present

## 2016-04-30 DIAGNOSIS — M9902 Segmental and somatic dysfunction of thoracic region: Secondary | ICD-10-CM | POA: Diagnosis not present

## 2016-04-30 DIAGNOSIS — M9901 Segmental and somatic dysfunction of cervical region: Secondary | ICD-10-CM | POA: Diagnosis not present

## 2016-04-30 DIAGNOSIS — M9903 Segmental and somatic dysfunction of lumbar region: Secondary | ICD-10-CM | POA: Diagnosis not present

## 2016-05-02 DIAGNOSIS — M5136 Other intervertebral disc degeneration, lumbar region: Secondary | ICD-10-CM | POA: Diagnosis not present

## 2016-05-02 DIAGNOSIS — M9902 Segmental and somatic dysfunction of thoracic region: Secondary | ICD-10-CM | POA: Diagnosis not present

## 2016-05-02 DIAGNOSIS — M9901 Segmental and somatic dysfunction of cervical region: Secondary | ICD-10-CM | POA: Diagnosis not present

## 2016-05-02 DIAGNOSIS — M9903 Segmental and somatic dysfunction of lumbar region: Secondary | ICD-10-CM | POA: Diagnosis not present

## 2016-05-13 ENCOUNTER — Other Ambulatory Visit: Payer: Self-pay | Admitting: Obstetrics & Gynecology

## 2016-05-13 DIAGNOSIS — N951 Menopausal and female climacteric states: Secondary | ICD-10-CM

## 2016-05-21 ENCOUNTER — Other Ambulatory Visit: Payer: Self-pay | Admitting: Oncology

## 2016-05-21 DIAGNOSIS — W57XXXA Bitten or stung by nonvenomous insect and other nonvenomous arthropods, initial encounter: Secondary | ICD-10-CM

## 2016-05-28 DIAGNOSIS — H9193 Unspecified hearing loss, bilateral: Secondary | ICD-10-CM | POA: Diagnosis not present

## 2016-05-30 ENCOUNTER — Encounter: Payer: Self-pay | Admitting: Internal Medicine

## 2016-06-05 DIAGNOSIS — M9903 Segmental and somatic dysfunction of lumbar region: Secondary | ICD-10-CM | POA: Diagnosis not present

## 2016-06-05 DIAGNOSIS — M9904 Segmental and somatic dysfunction of sacral region: Secondary | ICD-10-CM | POA: Diagnosis not present

## 2016-06-05 DIAGNOSIS — M9905 Segmental and somatic dysfunction of pelvic region: Secondary | ICD-10-CM | POA: Diagnosis not present

## 2016-06-05 DIAGNOSIS — M5136 Other intervertebral disc degeneration, lumbar region: Secondary | ICD-10-CM | POA: Diagnosis not present

## 2016-06-08 DIAGNOSIS — H9193 Unspecified hearing loss, bilateral: Secondary | ICD-10-CM | POA: Diagnosis not present

## 2016-06-08 DIAGNOSIS — H6593 Unspecified nonsuppurative otitis media, bilateral: Secondary | ICD-10-CM | POA: Diagnosis not present

## 2016-06-12 DIAGNOSIS — M9901 Segmental and somatic dysfunction of cervical region: Secondary | ICD-10-CM | POA: Diagnosis not present

## 2016-06-12 DIAGNOSIS — M5134 Other intervertebral disc degeneration, thoracic region: Secondary | ICD-10-CM | POA: Diagnosis not present

## 2016-06-12 DIAGNOSIS — M9902 Segmental and somatic dysfunction of thoracic region: Secondary | ICD-10-CM | POA: Diagnosis not present

## 2016-06-12 DIAGNOSIS — M50322 Other cervical disc degeneration at C5-C6 level: Secondary | ICD-10-CM | POA: Diagnosis not present

## 2016-06-15 ENCOUNTER — Encounter: Payer: Self-pay | Admitting: Internal Medicine

## 2016-06-15 ENCOUNTER — Other Ambulatory Visit: Payer: Self-pay | Admitting: *Deleted

## 2016-06-15 MED ORDER — TRAMADOL HCL 50 MG PO TABS: ORAL_TABLET | ORAL | 1 refills | Status: DC

## 2016-06-15 NOTE — Telephone Encounter (Signed)
Called to pharm 

## 2016-06-22 DIAGNOSIS — M9901 Segmental and somatic dysfunction of cervical region: Secondary | ICD-10-CM | POA: Diagnosis not present

## 2016-06-22 DIAGNOSIS — M50322 Other cervical disc degeneration at C5-C6 level: Secondary | ICD-10-CM | POA: Diagnosis not present

## 2016-06-22 DIAGNOSIS — M5134 Other intervertebral disc degeneration, thoracic region: Secondary | ICD-10-CM | POA: Diagnosis not present

## 2016-06-22 DIAGNOSIS — M9902 Segmental and somatic dysfunction of thoracic region: Secondary | ICD-10-CM | POA: Diagnosis not present

## 2016-06-29 DIAGNOSIS — M9901 Segmental and somatic dysfunction of cervical region: Secondary | ICD-10-CM | POA: Diagnosis not present

## 2016-06-29 DIAGNOSIS — M9902 Segmental and somatic dysfunction of thoracic region: Secondary | ICD-10-CM | POA: Diagnosis not present

## 2016-06-29 DIAGNOSIS — M50322 Other cervical disc degeneration at C5-C6 level: Secondary | ICD-10-CM | POA: Diagnosis not present

## 2016-06-29 DIAGNOSIS — M5134 Other intervertebral disc degeneration, thoracic region: Secondary | ICD-10-CM | POA: Diagnosis not present

## 2016-07-30 DIAGNOSIS — H9193 Unspecified hearing loss, bilateral: Secondary | ICD-10-CM | POA: Diagnosis not present

## 2016-07-30 DIAGNOSIS — H903 Sensorineural hearing loss, bilateral: Secondary | ICD-10-CM | POA: Diagnosis not present

## 2016-07-30 DIAGNOSIS — H838X1 Other specified diseases of right inner ear: Secondary | ICD-10-CM | POA: Diagnosis not present

## 2016-07-30 DIAGNOSIS — R42 Dizziness and giddiness: Secondary | ICD-10-CM | POA: Diagnosis not present

## 2016-07-30 DIAGNOSIS — R2681 Unsteadiness on feet: Secondary | ICD-10-CM | POA: Diagnosis not present

## 2016-07-30 DIAGNOSIS — Q165 Congenital malformation of inner ear: Secondary | ICD-10-CM | POA: Diagnosis not present

## 2016-07-30 DIAGNOSIS — Z45321 Encounter for adjustment and management of cochlear device: Secondary | ICD-10-CM | POA: Diagnosis not present

## 2016-07-31 ENCOUNTER — Ambulatory Visit (INDEPENDENT_AMBULATORY_CARE_PROVIDER_SITE_OTHER): Payer: Medicare Other | Admitting: Internal Medicine

## 2016-07-31 VITALS — BP 119/72 | HR 82 | Temp 98.6°F | Ht 63.0 in | Wt 208.0 lb

## 2016-07-31 DIAGNOSIS — Z87891 Personal history of nicotine dependence: Secondary | ICD-10-CM

## 2016-07-31 DIAGNOSIS — B9689 Other specified bacterial agents as the cause of diseases classified elsewhere: Secondary | ICD-10-CM | POA: Diagnosis not present

## 2016-07-31 DIAGNOSIS — L03211 Cellulitis of face: Secondary | ICD-10-CM

## 2016-07-31 MED ORDER — CIPROFLOXACIN HCL 500 MG PO TABS
500.0000 mg | ORAL_TABLET | Freq: Two times a day (BID) | ORAL | 0 refills | Status: AC
Start: 2016-07-31 — End: 2016-08-14

## 2016-07-31 MED ORDER — MELOXICAM 7.5 MG PO TABS: 7.5000 mg | ORAL_TABLET | Freq: Every day | ORAL | 0 refills | Status: DC

## 2016-07-31 NOTE — Patient Instructions (Signed)
For the infections, please take ciprofloxacin 1 tablet twice daily for the next 7 days.  For the pain, please take meloxicam 1 tablet daily. You can still take Tylenol but no more than 4000 mg daily. Avoid ibuprofen.

## 2016-07-31 NOTE — Assessment & Plan Note (Signed)
Assessment Yesterday, she was at [facility] undergoing testing for vestibular dysfunction and had to wear eyewear to perform the procedure. This morning, she woke up with a rash overlying the left upper cheek which has since progressed as evident by a photograph she took over this morning and my exam of her today. She remembers having similar episode one year ago which progressed to changes in her vision, worsening headache, and wanted to make sure she did not have a recurrent infection. She otherwise denies any fever though does feel malaise without any headache, changes in her vision, nausea.  Given the area of induration in the surrounding redness, I'm inclined to treat this as a recurrent episode of facial cellulitis. No signs of infection around her eye which is reassuring for now. Though likely etiology of her infection would be either staph or strep, we will retreat with ciprofloxacin as it was successful in the past. Per review of the chart, she has multiple antibiotic allergies, including to sulfa antibiotics, for which Bactrim would be contraindicated. She underwent a prolonged course of doxycycline which did not adequately treat her infection, and she required additional courses of ciprofloxacin.   Plan -Prescribe ciprofloxacin 500 mg twice daily 7 days -Follow-up in 1 week to reassess -Provided her with return precautions: Worsening headache, changes in her vision, worsening swelling

## 2016-07-31 NOTE — Progress Notes (Signed)
   CC: Rash  HPI:  [name] is a 45 y.o. female who presents today for rash. History was collected with aid of a sign language interpreter. Please see assessment & plan for status of chronic medical problems.   Past Medical History:  Diagnosis Date  . Anxiety   . Family history of malignant neoplasm of gastrointestinal tract   . GERD (gastroesophageal reflux disease)   . Headache(784.0) 04/2005   h/o headache in past that raised question of psuedotumor s/p therapeutic LP with an opening preassure of 18 cm H2O   . Hearing difficulty    b/l, 2/2 congenital rubella s/p stapedectomy. Using hearing aid  . Hepatic cyst    6 mm on CT done done in 05/2005  . Hypothyroidism   . IBS (irritable bowel syndrome)   . Shingles 03/2013    Review of Systems:  Please see each problem below for a pertinent review of systems.   Physical Exam:  Vitals:   07/31/16 1526  BP: 119/72  Pulse: 82  Temp: 98.6 F (37 C)  TempSrc: Oral  SpO2: 100%  Weight: 208 lb (94.3 kg)  Height: 5\' 3"  (1.6 m)   Physical Exam  Constitutional: She is oriented to person, place, and time. No distress.  HENT:  Head: Normocephalic and atraumatic.  Focal area of induration with surrounding erythema as noted in the picture below just underlying the lower lid of the right eye.  Eyes: Conjunctivae are normal. No scleral icterus.  Pulmonary/Chest: Effort normal. No respiratory distress.  Neurological: She is alert and oriented to person, place, and time.  Skin: She is not diaphoretic.        Assessment & Plan:   See Encounters Tab for problem based charting.  Patient discussed with Dr. Daryll Drown

## 2016-08-01 ENCOUNTER — Encounter: Payer: Self-pay | Admitting: Internal Medicine

## 2016-08-03 NOTE — Progress Notes (Signed)
Internal Medicine Clinic Attending  Case discussed with Dr. Patel,Rushil at the time of the visit.  We reviewed the resident's history and exam and pertinent patient test results.  I agree with the assessment, diagnosis, and plan of care documented in the resident's note.  

## 2016-08-06 ENCOUNTER — Telehealth: Payer: Self-pay | Admitting: Internal Medicine

## 2016-08-06 NOTE — Telephone Encounter (Signed)
APT. REMINDER CALL, LMTCB °

## 2016-08-07 ENCOUNTER — Encounter: Payer: Self-pay | Admitting: Internal Medicine

## 2016-08-07 ENCOUNTER — Ambulatory Visit: Payer: Medicare Other

## 2016-08-07 DIAGNOSIS — M9903 Segmental and somatic dysfunction of lumbar region: Secondary | ICD-10-CM | POA: Diagnosis not present

## 2016-08-07 DIAGNOSIS — M50322 Other cervical disc degeneration at C5-C6 level: Secondary | ICD-10-CM | POA: Diagnosis not present

## 2016-08-07 DIAGNOSIS — M5134 Other intervertebral disc degeneration, thoracic region: Secondary | ICD-10-CM | POA: Diagnosis not present

## 2016-08-07 DIAGNOSIS — M9901 Segmental and somatic dysfunction of cervical region: Secondary | ICD-10-CM | POA: Diagnosis not present

## 2016-08-20 ENCOUNTER — Encounter: Payer: Self-pay | Admitting: Internal Medicine

## 2016-08-20 DIAGNOSIS — M9901 Segmental and somatic dysfunction of cervical region: Secondary | ICD-10-CM | POA: Diagnosis not present

## 2016-08-20 DIAGNOSIS — M50322 Other cervical disc degeneration at C5-C6 level: Secondary | ICD-10-CM | POA: Diagnosis not present

## 2016-08-21 ENCOUNTER — Encounter: Payer: Self-pay | Admitting: Internal Medicine

## 2016-08-21 ENCOUNTER — Ambulatory Visit (INDEPENDENT_AMBULATORY_CARE_PROVIDER_SITE_OTHER): Payer: Medicare Other | Admitting: Internal Medicine

## 2016-08-21 VITALS — BP 139/80 | HR 82 | Temp 98.5°F | Ht 63.0 in | Wt 213.0 lb

## 2016-08-21 DIAGNOSIS — R197 Diarrhea, unspecified: Secondary | ICD-10-CM | POA: Diagnosis not present

## 2016-08-21 DIAGNOSIS — J020 Streptococcal pharyngitis: Secondary | ICD-10-CM

## 2016-08-21 DIAGNOSIS — G43009 Migraine without aura, not intractable, without status migrainosus: Secondary | ICD-10-CM

## 2016-08-21 DIAGNOSIS — Z79899 Other long term (current) drug therapy: Secondary | ICD-10-CM

## 2016-08-21 DIAGNOSIS — Z87891 Personal history of nicotine dependence: Secondary | ICD-10-CM | POA: Diagnosis not present

## 2016-08-21 DIAGNOSIS — J029 Acute pharyngitis, unspecified: Secondary | ICD-10-CM | POA: Insufficient documentation

## 2016-08-21 DIAGNOSIS — G43909 Migraine, unspecified, not intractable, without status migrainosus: Secondary | ICD-10-CM | POA: Diagnosis not present

## 2016-08-21 MED ORDER — AZITHROMYCIN 500 MG PO TABS: 500.0000 mg | ORAL_TABLET | Freq: Every day | ORAL | 0 refills | Status: AC

## 2016-08-21 MED ORDER — DEXTROMETHORPHAN-BENZOCAINE 5-7.5 MG MT LOZG: 1.0000 | LOZENGE | Freq: Two times a day (BID) | OROMUCOSAL | 0 refills | Status: DC | PRN

## 2016-08-21 MED ORDER — TRAMADOL HCL 50 MG PO TABS: ORAL_TABLET | ORAL | 1 refills | Status: DC

## 2016-08-21 NOTE — Assessment & Plan Note (Signed)
Assessment: Patient presenting with acute sore throat associated with a fever, tender cervical lymphadenopathy, and tonsillar swelling/exudate which gives her a score of 3 person for criteria. She does have a cough and voice hoarseness. She is also having diarrhea for the past 3 days which is likely related to her previous antibiotic use but denies any nausea, vomiting, abdominal pain. She has decreased by mouth intake secondary to sore throat.  Plan: Will treat for strep pharyngitis with azithromycin 500 mg once daily for 5 days, as she has an allergy to amoxicillin-clavulanic acid and cephalosporins. Given Rx for throat lozenges. Continue to monitor diarrhea.

## 2016-08-21 NOTE — Progress Notes (Signed)
   CC: Sore throat and swollen lymph nodes.   HPI:  Donna Davis is a 45 y.o. With past medical history as outlined below who presents to clinic for a sore throat and swollen lymph nodes 1 week. She has a hoarse voice, cervical lymphadenopathy, fever, and diarrhea. She has noticed the diarrhea for the past 3 days. Diarrhea is watery. She had 4-5 bowel movements yesterday and 2 bowel movements this morning. She denies any abdominal pain. She has decreased by mouth intake secondary to her sore throat. She had a fever while at home but states is controlled with Tylenol. Denies night sweats and chills. She also has a cough that is more frequent at night.  Past Medical History:  Diagnosis Date  . Anxiety   . Family history of malignant neoplasm of gastrointestinal tract   . GERD (gastroesophageal reflux disease)   . Headache(784.0) 04/2005   h/o headache in past that raised question of psuedotumor s/p therapeutic LP with an opening preassure of 18 cm H2O   . Hearing difficulty    b/l, 2/2 congenital rubella s/p stapedectomy. Using hearing aid  . Hepatic cyst    6 mm on CT done done in 05/2005  . Hypothyroidism   . IBS (irritable bowel syndrome)   . Shingles 03/2013    Review of Systems:  Pertinent review of systems noted in history of present illness. Review of system otherwise negative.  Physical Exam:  Vitals:   08/21/16 1009  BP: 139/80  Pulse: 82  Temp: 98.5 F (36.9 C)  TempSrc: Oral  SpO2: 100%  Weight: 213 lb (96.6 kg)  Height: 5\' 3"  (1.6 m)   Physical Exam  Constitutional: She is oriented to person, place, and time. She appears well-developed and well-nourished. No distress.  HENT:  Head: Normocephalic and atraumatic.  Pharynx erythematous with exudates, tender cervical lymphadenopathy. Unable to appreciate any mobile axillary lymph nodes however she was tender to palpation of her right axilla.  Eyes: Conjunctivae and EOM are normal. No scleral icterus.  Neck: Neck  supple.  Cardiovascular: Normal rate, regular rhythm and normal heart sounds.  Exam reveals no gallop and no friction rub.   No murmur heard. Pulmonary/Chest: Effort normal and breath sounds normal. No respiratory distress. She has no wheezes. She has no rales.  Abdominal: Soft. Bowel sounds are normal. She exhibits no distension. There is no tenderness. There is no rebound and no guarding.  Lymphadenopathy:    She has cervical adenopathy.  Neurological: She is alert and oriented to person, place, and time.  Skin: Skin is warm and dry. No rash noted. She is not diaphoretic. No erythema. No pallor.     Assessment & Plan:   See Encounters Tab for problem based charting.  Patient discussed with Dr. Evette Doffing

## 2016-08-21 NOTE — Assessment & Plan Note (Addendum)
Stable. Refilled tramadol.

## 2016-08-21 NOTE — Patient Instructions (Signed)
Take azithromycin 500mg  once daily for 5 days.   You can take throat lozenges twice a day as needed for sore throat.   If your diarrhea is likely from recent antibiotic use, if it does not improve or you develop abdominal pain call the clinic for an appointment.

## 2016-08-21 NOTE — Telephone Encounter (Signed)
Pt seen today in [facility] and given tramadol refill which I phoned into the pharmacyGoldston, Gema Ringold Cassady12/5/201711:30 AM

## 2016-08-22 NOTE — Progress Notes (Signed)
Internal Medicine Clinic Attending  Case discussed with Dr. Truong at the time of the visit.  We reviewed the resident's history and exam and pertinent patient test results.  I agree with the assessment, diagnosis, and plan of care documented in the resident's note.  

## 2016-08-27 ENCOUNTER — Encounter: Payer: Self-pay | Admitting: Internal Medicine

## 2016-08-27 ENCOUNTER — Ambulatory Visit (INDEPENDENT_AMBULATORY_CARE_PROVIDER_SITE_OTHER): Payer: Medicare Other | Admitting: Internal Medicine

## 2016-08-27 VITALS — BP 122/61 | HR 88 | Temp 98.1°F | Ht 63.0 in | Wt 209.5 lb

## 2016-08-27 DIAGNOSIS — Z87891 Personal history of nicotine dependence: Secondary | ICD-10-CM

## 2016-08-27 DIAGNOSIS — M79604 Pain in right leg: Secondary | ICD-10-CM | POA: Diagnosis not present

## 2016-08-27 DIAGNOSIS — M7121 Synovial cyst of popliteal space [Baker], right knee: Secondary | ICD-10-CM | POA: Insufficient documentation

## 2016-08-27 DIAGNOSIS — I889 Nonspecific lymphadenitis, unspecified: Secondary | ICD-10-CM

## 2016-08-27 DIAGNOSIS — J029 Acute pharyngitis, unspecified: Secondary | ICD-10-CM | POA: Diagnosis not present

## 2016-08-27 DIAGNOSIS — R591 Generalized enlarged lymph nodes: Secondary | ICD-10-CM

## 2016-08-27 MED ORDER — FAMOTIDINE 40 MG PO TABS: 40.0000 mg | ORAL_TABLET | Freq: Two times a day (BID) | ORAL | 1 refills | Status: DC | PRN

## 2016-08-27 MED ORDER — CEPHALEXIN 500 MG PO CAPS: 500.0000 mg | ORAL_CAPSULE | Freq: Two times a day (BID) | ORAL | 0 refills | Status: AC

## 2016-08-27 NOTE — Assessment & Plan Note (Signed)
Completed 5 day course of Azithromycin on 12/10 with only mild improvement of her pharyngitic symptoms. Repots ongoing fevers up to 101 yesterday, persistent but improved oropharyngeal erythema, pain, only tolerating liquids, painful lymphadenopathy. No signs or symptoms concerning for peritonsillar abscess. She reports the enlarged node in her right axilla is decreased in size after the antibiotics but she has developed two new ones behind her right knee that are very painful. She has now had persistent pharyngitis symptoms for approximately two weeks. Minimal improvement may be due to Azitho-resistant infection. Difficulty with antibiotic choices due to numerous allergies, commonly "GI upset" of unknown severity. Discussed with patient and she is willing to take the medication but anticipated GERD side effects.  Plan: - Prescribed 7 days Cephalexin 500mg  BID - reported GI upset/GERD allergy to this med in the past - Prescribed Pepcid for anticipated GERD  - If unable to tolerate can try Amoxicillin

## 2016-08-27 NOTE — Assessment & Plan Note (Signed)
Painful erythematous mobile nodules posterior medial right knee present for two days, initial one was distal from knee joint, second is more proximal. She report the pain extends in a line connecting the two along the medial right leg. Painful lymphadenopathy/lymphadenitis. No visible injuries or cuts distal RLE. Reports similar painful node in right axilla has improved. Suspect painful reactive nodes in setting of ongoing pharyngitis. No distal edema that would make [place] concerned for DVT.   Plan: - Treat pharyngitis with Keflex

## 2016-08-27 NOTE — Patient Instructions (Signed)
Please continue to take your medications as prescribed.  We have prescribed Cephalexin (500 mg) twice daily for 7 days.  We have also prescribed  Famotidine (Pepcid) up to two times daily for reflux symptoms.   If your symptoms get worse please call our clinic.

## 2016-08-27 NOTE — Progress Notes (Addendum)
   CC: Right leg pain  HPI:  [name] is a 45 y.o. female with PMHx detailed below presenting with 2 days of painful erythematous lumps behind her right knee and continues pharyngitis symptoms. She was seen in our clinic on 12/5 and prescribed a 5 day course of Azithromycin for presumed strep pharyngitis. She feels that her sore throat is modestly improved, but that her chief concern is these new spots on her leg. She also continues to have intermittent watery diarrhea.  Patient is deaf and ASL interpreter was necessary for this encounter.  See problem based assessment and plan below for additional details.  Past Medical History:  Diagnosis Date  . Anxiety   . Family history of malignant neoplasm of gastrointestinal tract   . GERD (gastroesophageal reflux disease)   . Headache(784.0) 04/2005   h/o headache in past that raised question of psuedotumor s/p therapeutic LP with an opening preassure of 18 cm H2O   . Hearing difficulty    b/l, 2/2 congenital rubella s/p stapedectomy. Using hearing aid  . Hepatic cyst    6 mm on CT done done in 05/2005  . Hypothyroidism   . IBS (irritable bowel syndrome)   . Shingles 03/2013    Review of Systems: Review of Systems  Constitutional: Positive for chills, fever and malaise/fatigue. Negative for diaphoresis.  HENT: Positive for sore throat. Negative for congestion.   Respiratory: Positive for cough. Negative for sputum production and shortness of breath.   Cardiovascular: Negative for chest pain, palpitations and leg swelling.  Gastrointestinal: Positive for abdominal pain and diarrhea. Negative for nausea and vomiting.  Skin: Negative for itching and rash.  Neurological: Negative for weakness.  Endo/Heme/Allergies:       Lymphadenopathy  All other systems reviewed and are negative.    Physical Exam: Vitals:   08/27/16 1113  BP: 122/61  Pulse: 88  Temp: 98.1 F (36.7 C)  TempSrc: Oral  SpO2: 98%  Weight: 209 lb 8 oz (95 kg)    Height: 5\' 3"  (1.6 m)   Body mass index is 37.11 kg/m. GENERAL- Obese casually-dress woman sitting comfortably in exam room chair, alert, in no distress HEENT- Atraumatic, PERRL, moist mucous membranes, oropharynx with visible erythema, no exudate, painful palpable proximal cervical lymphadenopathy CARDIAC- Regular rate and rhythm, no murmurs, rubs or gallops. RESP- Clear to ascultation bilaterally, no wheezing or crackles, normal work of breathing ABDOMEN- Obese, soft, mild generalized tenderness to palpation, nondistended EXTREMITIES- Obese bulk and range of motion, no edema, 2+ peripheral pulses, 2 palpable, mobile, erythematous, tender, nodules present over the posterior right medial knee SKIN- Warm, dry, intact, erythema over nodules PSYCH- Appropriate affect, clear speech, thoughts linear and goal-directed  Assessment & Plan:   See encounters tab for problem based medical decision making.  Patient seen with Dr. Lynnae January

## 2016-08-28 ENCOUNTER — Encounter: Payer: Self-pay | Admitting: Internal Medicine

## 2016-08-28 DIAGNOSIS — H903 Sensorineural hearing loss, bilateral: Secondary | ICD-10-CM | POA: Diagnosis not present

## 2016-08-28 DIAGNOSIS — H9193 Unspecified hearing loss, bilateral: Secondary | ICD-10-CM | POA: Diagnosis not present

## 2016-08-28 DIAGNOSIS — Z9621 Cochlear implant status: Secondary | ICD-10-CM | POA: Diagnosis not present

## 2016-08-28 DIAGNOSIS — H838X2 Other specified diseases of left inner ear: Secondary | ICD-10-CM | POA: Diagnosis not present

## 2016-08-28 DIAGNOSIS — T8130XA Disruption of wound, unspecified, initial encounter: Secondary | ICD-10-CM | POA: Diagnosis not present

## 2016-08-31 NOTE — Progress Notes (Signed)
Internal Medicine Clinic Attending  I saw and evaluated the patient.  I personally confirmed the key portions of the history and exam documented by Dr. Johnson and I reviewed pertinent patient test results.  The assessment, diagnosis, and plan were formulated together and I agree with the documentation in the resident's note.  

## 2016-09-05 ENCOUNTER — Other Ambulatory Visit: Payer: Self-pay | Admitting: Internal Medicine

## 2016-09-05 DIAGNOSIS — M50322 Other cervical disc degeneration at C5-C6 level: Secondary | ICD-10-CM | POA: Diagnosis not present

## 2016-09-05 DIAGNOSIS — M9901 Segmental and somatic dysfunction of cervical region: Secondary | ICD-10-CM | POA: Diagnosis not present

## 2016-09-05 NOTE — Telephone Encounter (Signed)
Refill Request ° ° °traMADol (ULTRAM) 50 MG tablet °

## 2016-09-06 ENCOUNTER — Telehealth: Payer: Self-pay | Admitting: Internal Medicine

## 2016-09-06 DIAGNOSIS — M50322 Other cervical disc degeneration at C5-C6 level: Secondary | ICD-10-CM | POA: Diagnosis not present

## 2016-09-06 DIAGNOSIS — M9901 Segmental and somatic dysfunction of cervical region: Secondary | ICD-10-CM | POA: Diagnosis not present

## 2016-09-06 NOTE — Telephone Encounter (Signed)
APT. REMINDER CALL, LMTCB °

## 2016-09-07 ENCOUNTER — Ambulatory Visit (INDEPENDENT_AMBULATORY_CARE_PROVIDER_SITE_OTHER): Payer: Medicare Other | Admitting: Pulmonary Disease

## 2016-09-07 ENCOUNTER — Other Ambulatory Visit: Payer: Self-pay | Admitting: Internal Medicine

## 2016-09-07 DIAGNOSIS — M7121 Synovial cyst of popliteal space [Baker], right knee: Secondary | ICD-10-CM

## 2016-09-07 DIAGNOSIS — M25461 Effusion, right knee: Secondary | ICD-10-CM | POA: Diagnosis not present

## 2016-09-07 DIAGNOSIS — M660 Rupture of popliteal cyst: Secondary | ICD-10-CM

## 2016-09-07 DIAGNOSIS — G43009 Migraine without aura, not intractable, without status migrainosus: Secondary | ICD-10-CM

## 2016-09-07 DIAGNOSIS — Z09 Encounter for follow-up examination after completed treatment for conditions other than malignant neoplasm: Secondary | ICD-10-CM

## 2016-09-07 DIAGNOSIS — Z87891 Personal history of nicotine dependence: Secondary | ICD-10-CM | POA: Diagnosis not present

## 2016-09-07 DIAGNOSIS — Z8709 Personal history of other diseases of the respiratory system: Secondary | ICD-10-CM | POA: Diagnosis not present

## 2016-09-07 DIAGNOSIS — J029 Acute pharyngitis, unspecified: Secondary | ICD-10-CM

## 2016-09-07 MED ORDER — TRAMADOL HCL 50 MG PO TABS: ORAL_TABLET | ORAL | 0 refills | Status: DC

## 2016-09-07 MED ORDER — MELOXICAM 15 MG PO TABS: 15.0000 mg | ORAL_TABLET | Freq: Every day | ORAL | 0 refills | Status: DC

## 2016-09-07 NOTE — Progress Notes (Signed)
Internal Medicine Clinic Attending  I saw and evaluated the patient.  I personally confirmed the key portions of the history and exam documented by Dr. Randell Patient and I reviewed pertinent patient test results.  The assessment, diagnosis, and plan were formulated together and I agree with the documentation in the resident's note.  I was present for the entirety of the procedure and personally performed key portions. Ultrasound was used during this procedure. She has a symptomatic inflammatory Baker's cyst with rupture on the right knee. She had minimal suprapatellar effusion what we were unable to sample, we were able to infuse 40mg  of Triamcinolone into the space with ultrasound guidance for symptomatic treatment.   Posterior transverse view of the right knee showing a deep Baker's cyst.

## 2016-09-07 NOTE — Progress Notes (Signed)
I saw Ms. Helene Kelp in [facility] with Resident Doctor Randell Patient.  She has been having a number of pain issues including knee pain, possible ruptured baker's cyst and cellulitis pain.  She ran out of her tramadol early.  Okay to refill today with # 45 tablets.  Hopefully pharmacy will honor request.

## 2016-09-07 NOTE — Progress Notes (Signed)
   CC: right knee pain  HPI:  [name] is a 45 y.o. woman with history as noted below presenting with right knee pain  She was last seen in clinic 12/11. Noted to have painful erythematous mobile nodules posterior medial right knee. She was prescribed cephalexin for unresolved acute pharyngitis. She reports her lymphadenopathy in her neck and her sore throat have improved. Her right leg however has worsened.   No fevers or chills. No chest pain or dyspnea. No recent travel. She feels like her leg is swollen. It hurts to bend her knee on the right. No problems with her left leg. No falls in over a month and a half. The pain overall has been 2-3 weeks. No history of blood clots. She had surgery on the right knee over two years ago. She has tried ice. This feels different from her post surgical pain.   Past Medical History:  Diagnosis Date  . Anxiety   . Family history of malignant neoplasm of gastrointestinal tract   . GERD (gastroesophageal reflux disease)   . Headache(784.0) 04/2005   h/o headache in past that raised question of psuedotumor s/p therapeutic LP with an opening preassure of 18 cm H2O   . Hearing difficulty    b/l, 2/2 congenital rubella s/p stapedectomy. Using hearing aid  . Hepatic cyst    6 mm on CT done done in 05/2005  . Hypothyroidism   . IBS (irritable bowel syndrome)   . Shingles 03/2013    Review of Systems:   No chest pain No nausea/vomiting  Physical Exam:  Vitals:   09/07/16 1409  BP: 123/70  Pulse: 98  Temp: 98.7 F (37.1 C)  TempSrc: Oral  SpO2: 99%  Weight: 209 lb 11.2 oz (95.1 kg)  Height: 5\' 3"  (1.6 m)   General Apperance: NAD HEENT: Normocephalic, atraumatic, anicteric sclera Neck: Supple, trachea midline Lungs: Clear to auscultation bilaterally. No wheezes, rhonchi or rales. Breathing comfortably Heart: Regular rate and rhythm, no murmur/rub/gallop Abdomen: Soft, nontender, nondistended, no rebound/guarding Extremities: Warm and  well perfused, nonpitting edema of right LE. Tender to palpation along posterior and medial aspect of knee. Some ecchymosis along medial aspect of knee. Neurologic: Alert and interactive. No gross deficits.  Assessment & Plan:   See Encounters Tab for problem based charting.  Patient discussed with Dr. Daryll Drown

## 2016-09-07 NOTE — Patient Instructions (Addendum)
What is a Baker's cyst? - A Baker's cyst is a fluid-filled sac behind the knee. This cyst can cause symptoms, such as pain or knee stiffness. The medical term for a Baker's cyst is "popliteal cyst."  What are the symptoms of a Baker's cyst? - Baker's cysts do not always cause symptoms. When they do, the symptoms can include:   Pain in the back of the knee  Knee stiffness  Swelling or a bulge at the back of the knee, especially when the leg is straight  People often find that their symptoms get worse if they stand for a long time or bend their knee too far. Symptoms and swelling sometimes also get worse with activity.  In some cases, Baker's cysts tear open and leak their fluid onto nearby tissues. If that happens, symptoms can include calf swelling and redness, or bruising below the knee.  How is a Baker's cyst treated? - The main treatment is an injection of steroid medicines directly into the knee. These steroids reduce swelling and inflammation.  If treatment with steroid medicines does not work, other (much less used) options include:   Draining the cyst with a needle  Surgery to remove the cyst  Is there anything I can do on my own to feel better? - Maybe. Your doctor or nurse might suggest resting the knee and taking medicines called "NSAIDs" for 1 to 2 weeks before he or she orders an imaging test or suggests a knee injection. Some examples of NSAIDs include ibuprofen (sample band names: Advil, Motrin) and naproxen (sample brand names: Aleve). +

## 2016-09-07 NOTE — Assessment & Plan Note (Signed)
She reports resolution of her symptoms. Completed antibiotics

## 2016-09-07 NOTE — Telephone Encounter (Signed)
Refilled during 12/22 office visit

## 2016-09-07 NOTE — Assessment & Plan Note (Signed)
Assessment: Right knee pain for 2-3 weeks most in posterior aspect with some ecchymosis of the medial aspect of the knee. Bedside ultrasound demonstrates posterior cyst.  Plan:  Right Knee Arthrocentesis  After consent was obtained, using sterile technique the right knee was prepped. The joint was entered and 0 ml's of fluid was withdrawn.  Steroid 2 mg and 1 ml plain 1% Lidocaine was then injected and the needle withdrawn.  The procedure was well tolerated.  The patient is asked to continue to rest the joint for a few more days before resuming regular activities.  Return precautions discussed. Meloxicam 15mg  daily for 7 days Tramadol as needed for breakthrough pain

## 2016-09-20 ENCOUNTER — Ambulatory Visit (INDEPENDENT_AMBULATORY_CARE_PROVIDER_SITE_OTHER): Payer: Medicare Other

## 2016-09-20 ENCOUNTER — Ambulatory Visit (HOSPITAL_COMMUNITY)
Admission: EM | Admit: 2016-09-20 | Discharge: 2016-09-20 | Disposition: A | Discharge status: Home / Self Care | Payer: Medicare Other | Attending: Emergency Medicine | Admitting: Emergency Medicine

## 2016-09-20 ENCOUNTER — Encounter (HOSPITAL_COMMUNITY): Payer: Self-pay | Admitting: Emergency Medicine

## 2016-09-20 DIAGNOSIS — S6991XA Unspecified injury of right wrist, hand and finger(s), initial encounter: Secondary | ICD-10-CM

## 2016-09-20 DIAGNOSIS — S61210A Laceration without foreign body of right index finger without damage to nail, initial encounter: Secondary | ICD-10-CM

## 2016-09-20 DIAGNOSIS — Z0389 Encounter for observation for other suspected diseases and conditions ruled out: Secondary | ICD-10-CM | POA: Diagnosis not present

## 2016-09-20 MED ORDER — IBUPROFEN 800 MG PO TABS: 800.0000 mg | ORAL_TABLET | Freq: Three times a day (TID) | ORAL | 0 refills | Status: DC | PRN

## 2016-09-20 MED ORDER — HYDROCODONE-ACETAMINOPHEN 5-325 MG PO TABS: 1.0000 | ORAL_TABLET | ORAL | 0 refills | Status: DC | PRN

## 2016-09-20 NOTE — Discharge Instructions (Signed)
800 mg of ibuprofen with 1 g of Tylenol up to 3 times a day as needed for pain. Or, if you're having severe pain, you may take the Norco with the ibuprofen instead of the Tylenol. Do not take Norco and Tylenol at the same time, as they both have Tylenol in them. Do not exceed 4 g of Tylenol from all sources in one day. Each tablet of Norco has 325 mg of Tylenol in it. Call Dr. Levell July office, and they will see you early next week. Continue doing local wound care for the laceration as you has been doing. Otherwise, wear his splints at all times. Go to the ER for pain not controlled with medications, fevers above 100.4, if your finger bturns blue, white, becomes cold,  or other concerns.

## 2016-09-20 NOTE — ED Provider Notes (Signed)
HPI  SUBJECTIVE:  Donna Davis is a right handed 46 y.o. female who presents with constant, dull, throbbing, right index finger pain for the past 2 days. Patient states that her dog leash got wrapped around her right index finger, and the dog ran off, and she states that her finger "went sideways" at the PIP. States that she reduced it herself. She also reports a laceration on the volar aspect of her distal phalanx.  she has been applying ice, Neosporin, Tylenol, Advil 800 mg. Ice seems to help. Symptoms are worse with any movement of her finger. She has a past medical history of hypothyroidism, deafness. 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 She is a former smoker. No history of injury to this finger, diabetes, hypertension. LMP: s/p hyserectomy PMD: Gilles Chiquito, MD   Past Medical History:  Diagnosis Date  . Anxiety   . Family history of malignant neoplasm of gastrointestinal tract   . GERD (gastroesophageal reflux disease)   . Headache(784.0) 04/2005   h/o headache in past that raised question of psuedotumor s/p therapeutic LP with an opening preassure of 18 cm H2O   . Hearing difficulty    b/l, 2/2 congenital rubella s/p stapedectomy. Using hearing aid  . Hepatic cyst    6 mm on CT done done in 05/2005  . Hypothyroidism   . IBS (irritable bowel syndrome)   . Shingles 03/2013    Past Surgical History:  Procedure Laterality Date  . ABDOMINAL ADHESION SURGERY     removal from bowel  . CESAREAN SECTION  1994  . CHOLECYSTECTOMY N/A 06/26/2013   Procedure: LAPAROSCOPIC CHOLECYSTECTOMY WITH ATTEMPTED INTRAOPERATIVE CHOLANGIOGRAM;  Surgeon: Harl Bowie, MD;  Location: [facility];  Service: General;  Laterality: N/A;  . JOINT REPLACEMENT    . LAPAROSCOPIC OVARIAN CYSTECTOMY  2001  . SALPINGOOPHORECTOMY Left   . STAPEDECTOMY  2007   Dr. Lucia Gaskins, left ear  . Thyroid Nodule removed  1997  . TOTAL ABDOMINAL HYSTERECTOMY  2005   Endometriosis with residual right ovary.  Multiple GYN surgeries for chronic pelvic pain; had LSO in past    Family History  Problem Relation Age of Onset  . Breast cancer Mother   . Ovarian cancer Mother   . Diabetes Mother   . Stomach cancer Maternal Grandmother   . Colon cancer Maternal Grandmother   . Diabetes Maternal Grandmother   . Pancreatic cancer Father   . Prostate cancer Father   . Colon cancer Father   . Diabetes Father   . Heart disease Father   . Irritable bowel syndrome Father   . Kidney disease Father   . Heart disease      both grandmothers  . Bipolar disorder Daughter     Social History  Substance Use Topics  . Smoking status: Former Smoker    Types: Cigarettes    Quit date: 02/18/2008  . Smokeless tobacco: Never Used     Comment: quit 29yrs ago  . Alcohol use 0.0 oz/week     Comment: Rarely.    No current facility-administered medications for this encounter.   Current Outpatient Prescriptions:  .  busPIRone (BUSPAR) 10 MG tablet, Take 5-20 mg by mouth 3 (three) times daily. Takes 1 tab in am, 1/2 tab at lunch ad 1-2 tabs at bedtime, Disp: , Rfl:  .  estradiol (ESTRACE) 1 MG tablet, TAKE 1 TABLET EVERY DAY, Disp: 30 tablet, Rfl: 10 .  levothyroxine (SYNTHROID, LEVOTHROID) 25 MCG tablet, TAKE 1  TABLET BY MOUTH DAILY, Disp: 31 tablet, Rfl: 11 .  Omega-3 Fatty Acids (FISH OIL) 1000 MG CAPS, Take 1,000 mg by mouth daily., Disp: , Rfl:  .  albuterol (PROVENTIL HFA;VENTOLIN HFA) 108 (90 BASE) MCG/ACT inhaler, Inhale into the lungs every 6 (six) hours as needed for wheezing or shortness of breath., Disp: , Rfl:  .  ALPRAZolam (XANAX) 0.25 MG tablet, Take 1 tablet (0.25 mg total) by mouth at bedtime as needed., Disp: 30 tablet, Rfl: 5 .  Ascorbic Acid (VITAMIN C PO), Take 1 tablet by mouth daily., Disp: , Rfl:  .  benzonatate (TESSALON) 100 MG capsule, Take 1 capsule (100 mg total) by mouth 3 (three) times daily as needed for cough. (Patient not taking: Reported on 01/13/2015), Disp: 21 capsule, Rfl: 0 .   CALCIUM PO, Take 1 tablet by mouth daily., Disp: , Rfl:  .  cetirizine (ZYRTEC) 10 MG tablet, TAKE 1 TABLET (10 MG TOTAL) BY MOUTH DAILY., Disp: 30 tablet, Rfl: 6 .  Cholecalciferol (VITAMIN D PO), Take 1 tablet by mouth daily., Disp: , Rfl:  .  Dextromethorphan-Benzocaine (SORE THROAT & COUGH LOZENGES) 5-7.5 MG LOZG, Use as directed 1 lozenge in the mouth or throat 2 (two) times daily as needed., Disp: 30 each, Rfl: 0 .  famotidine (PEPCID) 40 MG tablet, Take 1 tablet (40 mg total) by mouth 2 (two) times daily as needed for heartburn or indigestion., Disp: 30 tablet, Rfl: 1 .  HYDROcodone-acetaminophen (NORCO/VICODIN) 5-325 MG tablet, Take 1-2 tablets by mouth every 4 (four) hours as needed for moderate pain., Disp: 20 tablet, Rfl: 0 .  ibuprofen (ADVIL,MOTRIN) 800 MG tablet, Take 1 tablet (800 mg total) by mouth every 8 (eight) hours as needed., Disp: 30 tablet, Rfl: 0 .  ipratropium (ATROVENT) 0.06 % nasal spray, Place 2 sprays into both nostrils 4 (four) times daily. For nasal congestion, Disp: 15 mL, Rfl: 0 .  loperamide (IMODIUM A-D) 2 MG capsule, Take 2 tablets now. Then take 1 tablet as needed every time you have an episode of diarrhea., Disp: 30 capsule, Rfl: 0 .  omeprazole (PRILOSEC) 20 MG capsule, Take 1 capsule (20 mg total) by mouth daily., Disp: 60 capsule, Rfl: 0 .  ondansetron (ZOFRAN) 4 MG tablet, Take 1 tablet (4 mg total) by mouth every 6 (six) hours., Disp: 6 tablet, Rfl: 0 .  ondansetron (ZOFRAN) 4 MG tablet, Take 1 tablet (4 mg total) by mouth every 8 (eight) hours as needed for nausea or vomiting., Disp: 12 tablet, Rfl: 0 .  polyethylene glycol (MIRALAX / GLYCOLAX) packet, Take 17 g by mouth 2 (two) times daily. Take 17g PO BID until you have daily soft stools, then reduce to once daily or every other day to maintain daily soft stools, Disp: 14 each, Rfl: 0 .  promethazine (PHENERGAN) 25 MG tablet, Take 1 tablet (25 mg total) by mouth every 6 (six) hours as needed for nausea or  vomiting., Disp: 30 tablet, Rfl: 0  Allergies  Allergen Reactions  . Onion Anaphylaxis  . Other Other (See Comments)    [place]ssmo Prep - drop in BP Steroids Went crazy  . Shellfish Allergy Anaphylaxis and Swelling  . Amitriptyline Anxiety, Palpitations and Other (See Comments)    hallucinations  . Amoxicillin-Pot Clavulanate Nausea And Vomiting    With high dose, low dose ok  . Amoxicillin-Pot Clavulanate Nausea And Vomiting    With high dose, low dose ok  . Cefuroxime Nausea And Vomiting  . Cephalexin Other (See  Comments)     gi upset  gi upset  . Ciprofloxacin Other (See Comments)     Fevers  . Clarithromycin Nausea And Vomiting and Other (See Comments)    Pt is OK with azithromycin  gi upset  . Clarithromycin Nausea And Vomiting    Pt is OK with azithromycin  . Doxycycline Other (See Comments) and Hives     gi upset  gi upset  . Propranolol Other (See Comments)    Fatigue and makes her "feel crazy", refuses to take.  . Sulfonamide Derivatives Other (See Comments)    vaginal blisters  . Sulfa Antibiotics Other (See Comments)    Rash, vaginal blisters  . Benadryl [Diphenhydramine Hcl (Sleep)] Anxiety    Extreme agitation  . Shrimp Flavor Other (See Comments)    unknown     ROS  As noted in HPI.   Physical Exam  BP 147/78 (BP Location: Left Wrist)   Pulse 79   Temp 98.4 F (36.9 C) (Oral)   LMP 12/26/2004   SpO2 99%   Constitutional: Well developed, well nourished, no acute distress Eyes:  EOMI, conjunctiva normal bilaterally HENT: Normocephalic, atraumatic,mucus membranes moist Respiratory: Normal inspiratory effort Cardiovascular: Normal rate GI: nondistended skin: No rash, skin intact Musculoskeletal:  CR< 2 secs in index finger. Right index finger: Pt unable to flex at  PIP. Able to flex a small amount at DIP. Healing irregular laceration over the pad of the distal phalanx. No evidence of infection. Pain at the PIP, middle phalanx, DIP, distal phalanx.  DIP, PIP seem to be stable on varus/valgus stress although this is painful. 2-point discrimination absent at 87mm. no tenderness at the second MCP or along the second metacarpal. Pt able to fully extend finger. Rest of hand, Wrist WNL.  Neurologic: Alert & oriented x 3, no focal neuro deficits Psychiatric: Speech and behavior appropriate   ED Course   Medications - No data to display  Orders Placed This Encounter  Procedures  . DG Finger Index Right    Standing Status:   Standing    Number of Occurrences:   1    Order Specific Question:   Reason for Exam (SYMPTOM  OR DIAGNOSIS REQUIRED)    Answer:   s/p PIP dislocation can't bend at DIP r/o fx  . Apply finger splint static    Standing Status:   Standing    Number of Occurrences:   1    Order Specific Question:   Laterality    Answer:   Right    No results found for this or any previous visit (from the past 24 hour(s)). Dg Finger Index Right  Result Date: 09/20/2016 CLINICAL DATA:  Status post PIP joint dislocation of the second digit EXAM: RIGHT INDEX FINGER 2+V COMPARISON:  None. FINDINGS: There is no evidence of fracture or dislocation. There is mild osteoarthritis of the second DIP joint. Soft tissues are unremarkable. IMPRESSION: No acute osseous injury of the right second digit. Electronically Signed   By: Kathreen Devoid   On: 09/20/2016 13:24    ED Clinical Impression  Laceration of right index finger without foreign body without damage to nail, initial encounter  Injury of finger of right hand, initial encounter   ED Assessment/Plan  Reviewed imaging independently. No evidence of fracture or dislocation. No osteoarthritis of the second DIP. See radiology report for details  Concern for rupture of the FDP with the inability to flex her distal phalanx. She is able to flex somewhat at  PIP - think FDS intact.  Discussed case with Judson Roch, Dr. Levell July nurse. She discussed the case with him and he reviewed the x-rays. He agrees  with plan to immobilize the finger in a splint, will send home with pain medicine, and patient is to call the office and they will see her next week for follow-up. She will continue bacitracin and wound care for her laceration. There is nothing to sew.  Discussed imaging, MDM, plan and followup with patient. Discussed sn/sx that should prompt return to the ED. Patient agrees with plan.   Meds ordered this encounter  Medications  . ibuprofen (ADVIL,MOTRIN) 800 MG tablet    Sig: Take 1 tablet (800 mg total) by mouth every 8 (eight) hours as needed.    Dispense:  30 tablet    Refill:  0  . HYDROcodone-acetaminophen (NORCO/VICODIN) 5-325 MG tablet    Sig: Take 1-2 tablets by mouth every 4 (four) hours as needed for moderate pain.    Dispense:  20 tablet    Refill:  0    *This clinic note was created using [employer]. Therefore, there may be occasional mistakes despite careful proofreading.  ?   Melynda Ripple, MD 09/20/16 [id number]

## 2016-09-20 NOTE — ED Triage Notes (Signed)
Pt injured her right 2nd finger two days ago while walking her dog.  She states she had the leash wrapped around her finger and her dog ran ahead and pulled the leash.  It caused a laceration on her finger and she also reports the finger was crooked, but she put her finger back in place herself.

## 2016-09-26 DIAGNOSIS — S63270A Dislocation of unspecified interphalangeal joint of right index finger, initial encounter: Secondary | ICD-10-CM | POA: Diagnosis not present

## 2016-09-26 DIAGNOSIS — S66120A Laceration of flexor muscle, fascia and tendon of right index finger at wrist and hand level, initial encounter: Secondary | ICD-10-CM | POA: Diagnosis not present

## 2016-09-27 DIAGNOSIS — M9903 Segmental and somatic dysfunction of lumbar region: Secondary | ICD-10-CM | POA: Diagnosis not present

## 2016-09-27 DIAGNOSIS — M5031 Other cervical disc degeneration,  high cervical region: Secondary | ICD-10-CM | POA: Diagnosis not present

## 2016-09-27 DIAGNOSIS — M9901 Segmental and somatic dysfunction of cervical region: Secondary | ICD-10-CM | POA: Diagnosis not present

## 2016-09-27 DIAGNOSIS — M5136 Other intervertebral disc degeneration, lumbar region: Secondary | ICD-10-CM | POA: Diagnosis not present

## 2016-10-02 ENCOUNTER — Other Ambulatory Visit: Payer: Self-pay | Admitting: Internal Medicine

## 2016-10-02 ENCOUNTER — Encounter: Payer: Self-pay | Admitting: Internal Medicine

## 2016-10-08 ENCOUNTER — Other Ambulatory Visit: Payer: Self-pay | Admitting: Orthopedic Surgery

## 2016-10-08 DIAGNOSIS — M5136 Other intervertebral disc degeneration, lumbar region: Secondary | ICD-10-CM | POA: Diagnosis not present

## 2016-10-08 DIAGNOSIS — M9903 Segmental and somatic dysfunction of lumbar region: Secondary | ICD-10-CM | POA: Diagnosis not present

## 2016-10-08 DIAGNOSIS — S63270D Dislocation of unspecified interphalangeal joint of right index finger, subsequent encounter: Secondary | ICD-10-CM | POA: Diagnosis not present

## 2016-10-08 DIAGNOSIS — M9901 Segmental and somatic dysfunction of cervical region: Secondary | ICD-10-CM | POA: Diagnosis not present

## 2016-10-08 DIAGNOSIS — S64490D Injury of digital nerve of right index finger, subsequent encounter: Secondary | ICD-10-CM | POA: Diagnosis not present

## 2016-10-08 DIAGNOSIS — S66120D Laceration of flexor muscle, fascia and tendon of right index finger at wrist and hand level, subsequent encounter: Secondary | ICD-10-CM | POA: Diagnosis not present

## 2016-10-08 DIAGNOSIS — M5031 Other cervical disc degeneration,  high cervical region: Secondary | ICD-10-CM | POA: Diagnosis not present

## 2016-10-09 ENCOUNTER — Encounter (HOSPITAL_BASED_OUTPATIENT_CLINIC_OR_DEPARTMENT_OTHER): Payer: Self-pay | Admitting: *Deleted

## 2016-10-09 NOTE — Telephone Encounter (Signed)
Xanax rx called to [facility]. 

## 2016-10-10 ENCOUNTER — Encounter: Payer: Self-pay | Admitting: Internal Medicine

## 2016-10-10 ENCOUNTER — Encounter (HOSPITAL_BASED_OUTPATIENT_CLINIC_OR_DEPARTMENT_OTHER): Payer: Self-pay | Admitting: *Deleted

## 2016-10-10 DIAGNOSIS — M9901 Segmental and somatic dysfunction of cervical region: Secondary | ICD-10-CM | POA: Diagnosis not present

## 2016-10-10 DIAGNOSIS — M9903 Segmental and somatic dysfunction of lumbar region: Secondary | ICD-10-CM | POA: Diagnosis not present

## 2016-10-10 DIAGNOSIS — M5136 Other intervertebral disc degeneration, lumbar region: Secondary | ICD-10-CM | POA: Diagnosis not present

## 2016-10-10 DIAGNOSIS — M5031 Other cervical disc degeneration,  high cervical region: Secondary | ICD-10-CM | POA: Diagnosis not present

## 2016-10-11 DIAGNOSIS — M9903 Segmental and somatic dysfunction of lumbar region: Secondary | ICD-10-CM | POA: Diagnosis not present

## 2016-10-11 DIAGNOSIS — M5136 Other intervertebral disc degeneration, lumbar region: Secondary | ICD-10-CM | POA: Diagnosis not present

## 2016-10-11 DIAGNOSIS — M5031 Other cervical disc degeneration,  high cervical region: Secondary | ICD-10-CM | POA: Diagnosis not present

## 2016-10-11 DIAGNOSIS — M9901 Segmental and somatic dysfunction of cervical region: Secondary | ICD-10-CM | POA: Diagnosis not present

## 2016-10-16 ENCOUNTER — Ambulatory Visit (HOSPITAL_BASED_OUTPATIENT_CLINIC_OR_DEPARTMENT_OTHER): Payer: Medicare Other | Admitting: Anesthesiology

## 2016-10-16 ENCOUNTER — Ambulatory Visit (HOSPITAL_BASED_OUTPATIENT_CLINIC_OR_DEPARTMENT_OTHER)
Admission: RE | Admit: 2016-10-16 | Discharge: 2016-10-16 | Disposition: A | Discharge status: Home / Self Care | Payer: Medicare Other | Source: Ambulatory Visit | Attending: Orthopedic Surgery | Admitting: Orthopedic Surgery

## 2016-10-16 ENCOUNTER — Encounter (HOSPITAL_BASED_OUTPATIENT_CLINIC_OR_DEPARTMENT_OTHER)
Admission: RE | Disposition: A | Discharge status: Home / Self Care | Payer: Self-pay | Source: Ambulatory Visit | Attending: Orthopedic Surgery

## 2016-10-16 ENCOUNTER — Encounter (HOSPITAL_BASED_OUTPATIENT_CLINIC_OR_DEPARTMENT_OTHER): Payer: Self-pay | Admitting: *Deleted

## 2016-10-16 DIAGNOSIS — Z888 Allergy status to other drugs, medicaments and biological substances status: Secondary | ICD-10-CM | POA: Diagnosis not present

## 2016-10-16 DIAGNOSIS — S66120A Laceration of flexor muscle, fascia and tendon of right index finger at wrist and hand level, initial encounter: Secondary | ICD-10-CM | POA: Insufficient documentation

## 2016-10-16 DIAGNOSIS — Z79899 Other long term (current) drug therapy: Secondary | ICD-10-CM | POA: Insufficient documentation

## 2016-10-16 DIAGNOSIS — Z880 Allergy status to penicillin: Secondary | ICD-10-CM | POA: Insufficient documentation

## 2016-10-16 DIAGNOSIS — Z882 Allergy status to sulfonamides status: Secondary | ICD-10-CM | POA: Insufficient documentation

## 2016-10-16 DIAGNOSIS — X58XXXA Exposure to other specified factors, initial encounter: Secondary | ICD-10-CM | POA: Diagnosis not present

## 2016-10-16 DIAGNOSIS — S61210A Laceration without foreign body of right index finger without damage to nail, initial encounter: Secondary | ICD-10-CM | POA: Diagnosis not present

## 2016-10-16 DIAGNOSIS — F419 Anxiety disorder, unspecified: Secondary | ICD-10-CM | POA: Insufficient documentation

## 2016-10-16 DIAGNOSIS — Z91013 Allergy to seafood: Secondary | ICD-10-CM | POA: Diagnosis not present

## 2016-10-16 DIAGNOSIS — K219 Gastro-esophageal reflux disease without esophagitis: Secondary | ICD-10-CM | POA: Diagnosis not present

## 2016-10-16 DIAGNOSIS — Z87891 Personal history of nicotine dependence: Secondary | ICD-10-CM | POA: Insufficient documentation

## 2016-10-16 DIAGNOSIS — E039 Hypothyroidism, unspecified: Secondary | ICD-10-CM | POA: Diagnosis not present

## 2016-10-16 DIAGNOSIS — Z881 Allergy status to other antibiotic agents status: Secondary | ICD-10-CM | POA: Diagnosis not present

## 2016-10-16 DIAGNOSIS — Z79890 Hormone replacement therapy (postmenopausal): Secondary | ICD-10-CM | POA: Diagnosis not present

## 2016-10-16 HISTORY — DX: Migraine, unspecified, not intractable, without status migrainosus: G43.909

## 2016-10-16 HISTORY — PX: WOUND EXPLORATION: SHX6188

## 2016-10-16 SURGERY — WOUND EXPLORATION
Anesthesia: General | Site: Finger | Laterality: Right

## 2016-10-16 MED ORDER — MEPERIDINE HCL 25 MG/ML IJ SOLN: 6.2500 mg | INTRAMUSCULAR | Status: DC | PRN

## 2016-10-16 MED ORDER — FENTANYL CITRATE (PF) 100 MCG/2ML IJ SOLN
50.0000 ug | INTRAMUSCULAR | Status: DC | PRN
  Administered 2016-10-16: 100 ug via INTRAVENOUS

## 2016-10-16 MED ORDER — PROPOFOL 10 MG/ML IV BOLUS
INTRAVENOUS | Status: DC | PRN
  Administered 2016-10-16: 200 mg via INTRAVENOUS

## 2016-10-16 MED ORDER — HYDROMORPHONE HCL 1 MG/ML IJ SOLN
0.2500 mg | INTRAMUSCULAR | Status: DC | PRN
  Administered 2016-10-16 (×3): 0.5 mg via INTRAVENOUS

## 2016-10-16 MED ORDER — PROMETHAZINE HCL 25 MG/ML IJ SOLN: 6.2500 mg | INTRAMUSCULAR | Status: DC | PRN

## 2016-10-16 MED ORDER — MIDAZOLAM HCL 2 MG/2ML IJ SOLN
INTRAMUSCULAR | Status: AC
  Filled 2016-10-16: qty 2

## 2016-10-16 MED ORDER — KETOROLAC TROMETHAMINE 30 MG/ML IJ SOLN
INTRAMUSCULAR | Status: AC
  Filled 2016-10-16: qty 1

## 2016-10-16 MED ORDER — OXYCODONE HCL 5 MG PO TABS
5.0000 mg | ORAL_TABLET | Freq: Once | ORAL | Status: AC | PRN
  Administered 2016-10-16: 5 mg via ORAL

## 2016-10-16 MED ORDER — SCOPOLAMINE 1 MG/3DAYS TD PT72: 1.0000 | MEDICATED_PATCH | Freq: Once | TRANSDERMAL | Status: DC | PRN

## 2016-10-16 MED ORDER — VANCOMYCIN HCL IN DEXTROSE 1-5 GM/200ML-% IV SOLN
INTRAVENOUS | Status: AC
  Filled 2016-10-16: qty 200

## 2016-10-16 MED ORDER — FENTANYL CITRATE (PF) 100 MCG/2ML IJ SOLN
INTRAMUSCULAR | Status: AC
  Filled 2016-10-16: qty 2

## 2016-10-16 MED ORDER — DEXAMETHASONE SODIUM PHOSPHATE 10 MG/ML IJ SOLN
INTRAMUSCULAR | Status: DC | PRN
  Administered 2016-10-16: 4 mg via INTRAVENOUS

## 2016-10-16 MED ORDER — LACTATED RINGERS IV SOLN
INTRAVENOUS | Status: DC
  Administered 2016-10-16 (×2): via INTRAVENOUS

## 2016-10-16 MED ORDER — VANCOMYCIN HCL IN DEXTROSE 1-5 GM/200ML-% IV SOLN
1000.0000 mg | INTRAVENOUS | Status: AC
  Administered 2016-10-16: 1000 mg via INTRAVENOUS

## 2016-10-16 MED ORDER — MIDAZOLAM HCL 2 MG/2ML IJ SOLN
1.0000 mg | INTRAMUSCULAR | Status: DC | PRN
  Administered 2016-10-16: 2 mg via INTRAVENOUS

## 2016-10-16 MED ORDER — ONDANSETRON HCL 4 MG/2ML IJ SOLN
INTRAMUSCULAR | Status: DC | PRN
  Administered 2016-10-16 (×2): 4 mg via INTRAVENOUS

## 2016-10-16 MED ORDER — HYDROMORPHONE HCL 1 MG/ML IJ SOLN
INTRAMUSCULAR | Status: AC
  Filled 2016-10-16: qty 1

## 2016-10-16 MED ORDER — CHLORHEXIDINE GLUCONATE 4 % EX LIQD: 60.0000 mL | Freq: Once | CUTANEOUS | Status: DC

## 2016-10-16 MED ORDER — KETOROLAC TROMETHAMINE 30 MG/ML IJ SOLN
30.0000 mg | Freq: Once | INTRAMUSCULAR | Status: AC | PRN
  Administered 2016-10-16: 30 mg via INTRAVENOUS

## 2016-10-16 MED ORDER — HYDROCODONE-ACETAMINOPHEN 7.5-325 MG PO TABS: 1.0000 | ORAL_TABLET | Freq: Once | ORAL | Status: DC | PRN

## 2016-10-16 MED ORDER — OXYCODONE HCL 5 MG PO TABS
ORAL_TABLET | ORAL | Status: AC
  Filled 2016-10-16: qty 1

## 2016-10-16 MED ORDER — BUPIVACAINE HCL (PF) 0.25 % IJ SOLN
INTRAMUSCULAR | Status: DC | PRN
  Administered 2016-10-16: 10 mL

## 2016-10-16 MED ORDER — OXYCODONE HCL 5 MG/5ML PO SOLN: 5.0000 mg | Freq: Once | ORAL | Status: AC | PRN

## 2016-10-16 MED ORDER — HYDROCODONE-ACETAMINOPHEN 5-325 MG PO TABS: ORAL_TABLET | ORAL | 0 refills | Status: DC

## 2016-10-16 MED ORDER — LIDOCAINE HCL (CARDIAC) 20 MG/ML IV SOLN
INTRAVENOUS | Status: DC | PRN
  Administered 2016-10-16: 30 mg via INTRAVENOUS

## 2016-10-16 SURGICAL SUPPLY — 68 items
BAG DECANTER FOR FLEXI CONT (MISCELLANEOUS) IMPLANT
BANDAGE ACE 3X5.8 VEL STRL LF (GAUZE/BANDAGES/DRESSINGS) ×1 IMPLANT
BLADE MINI RND TIP GREEN BEAV (BLADE) IMPLANT
BLADE SURG 15 STRL LF DISP TIS (BLADE) ×4 IMPLANT
BLADE SURG 15 STRL SS (BLADE) ×6
BNDG CMPR 9X4 STRL LF SNTH (GAUZE/BANDAGES/DRESSINGS) ×2
BNDG COHESIVE 1X5 TAN STRL LF (GAUZE/BANDAGES/DRESSINGS) ×2 IMPLANT
BNDG ESMARK 4X9 LF (GAUZE/BANDAGES/DRESSINGS) ×3 IMPLANT
BNDG GAUZE ELAST 4 BULKY (GAUZE/BANDAGES/DRESSINGS) IMPLANT
BRUSH SCRUB EZ PLAIN DRY (MISCELLANEOUS) ×1 IMPLANT
CATH ROBINSON RED A/P 10FR (CATHETERS) IMPLANT
CHLORAPREP W/TINT 26ML (MISCELLANEOUS) ×3 IMPLANT
CORDS BIPOLAR (ELECTRODE) ×3 IMPLANT
COVER BACK TABLE 60X90IN (DRAPES) ×3 IMPLANT
COVER MAYO STAND STRL (DRAPES) ×3 IMPLANT
CUFF TOURNIQUET SINGLE 18IN (TOURNIQUET CUFF) ×2 IMPLANT
DECANTER SPIKE VIAL GLASS SM (MISCELLANEOUS) IMPLANT
DRAPE EXTREMITY T 121X128X90 (DRAPE) ×3 IMPLANT
DRAPE SURG 17X23 STRL (DRAPES) ×3 IMPLANT
GAUZE SPONGE 4X4 12PLY STRL (GAUZE/BANDAGES/DRESSINGS) ×3 IMPLANT
GAUZE XEROFORM 1X8 LF (GAUZE/BANDAGES/DRESSINGS) ×3 IMPLANT
GLOVE BIO SURGEON STRL SZ 6.5 (GLOVE) ×3 IMPLANT
GLOVE BIO SURGEON STRL SZ7.5 (GLOVE) ×3 IMPLANT
GLOVE BIO SURGEON STRL SZ8.5 (GLOVE) ×2 IMPLANT
GLOVE BIOGEL PI IND STRL 7.0 (GLOVE) ×1 IMPLANT
GLOVE BIOGEL PI IND STRL 8 (GLOVE) ×3 IMPLANT
GLOVE BIOGEL PI IND STRL 8.5 (GLOVE) ×1 IMPLANT
GLOVE BIOGEL PI IND STRL 9 (GLOVE) ×1 IMPLANT
GLOVE BIOGEL PI INDICATOR 7.0 (GLOVE) ×1
GLOVE BIOGEL PI INDICATOR 8 (GLOVE) ×2
GLOVE BIOGEL PI INDICATOR 8.5 (GLOVE) ×1
GLOVE BIOGEL PI INDICATOR 9 (GLOVE) ×1
GLOVE SURG ORTHO 8.0 STRL STRW (GLOVE) ×3 IMPLANT
GOWN STRL REUS W/ TWL LRG LVL3 (GOWN DISPOSABLE) ×2 IMPLANT
GOWN STRL REUS W/TWL LRG LVL3 (GOWN DISPOSABLE) ×3
GOWN STRL REUS W/TWL XL LVL3 (GOWN DISPOSABLE) ×3 IMPLANT
LOOP VESSEL MAXI BLUE (MISCELLANEOUS) IMPLANT
NDL HYPO 25X1 1.5 SAFETY (NEEDLE) IMPLANT
NDL SAFETY ECLIPSE 18X1.5 (NEEDLE) IMPLANT
NEEDLE HYPO 18GX1.5 SHARP (NEEDLE)
NEEDLE HYPO 25X1 1.5 SAFETY (NEEDLE) ×3 IMPLANT
NS IRRIG 1000ML POUR BTL (IV SOLUTION) ×3 IMPLANT
PACK BASIN DAY SURGERY FS (CUSTOM PROCEDURE TRAY) ×3 IMPLANT
PAD CAST 3X4 CTTN HI CHSV (CAST SUPPLIES) IMPLANT
PAD CAST 4YDX4 CTTN HI CHSV (CAST SUPPLIES) IMPLANT
PADDING CAST ABS 4INX4YD NS (CAST SUPPLIES)
PADDING CAST ABS COTTON 4X4 ST (CAST SUPPLIES) ×1 IMPLANT
PADDING CAST COTTON 3X4 STRL (CAST SUPPLIES)
PADDING CAST COTTON 4X4 STRL (CAST SUPPLIES)
SLEEVE SCD COMPRESS KNEE MED (MISCELLANEOUS) IMPLANT
SPEAR EYE SURG WECK-CEL (MISCELLANEOUS) ×3 IMPLANT
SPLINT PLASTER CAST XFAST 3X15 (CAST SUPPLIES) IMPLANT
SPLINT PLASTER XTRA FASTSET 3X (CAST SUPPLIES)
STOCKINETTE 4X48 STRL (DRAPES) ×3 IMPLANT
SUT ETHIBOND 3-0 V-5 (SUTURE) IMPLANT
SUT ETHILON 4 0 PS 2 18 (SUTURE) ×3 IMPLANT
SUT FIBERWIRE 4-0 18 TAPR NDL (SUTURE)
SUT NYLON 9 0 VRM6 (SUTURE) IMPLANT
SUT PROLENE 6 0 P 1 18 (SUTURE) IMPLANT
SUT SILK 4 0 PS 2 (SUTURE) ×2 IMPLANT
SUT SUPRAMID 4-0 (SUTURE) IMPLANT
SUT VICRYL 4-0 PS2 18IN ABS (SUTURE) IMPLANT
SUTURE FIBERWR 4-0 18 TAPR NDL (SUTURE) IMPLANT
SYR BULB 3OZ (MISCELLANEOUS) ×3 IMPLANT
SYR CONTROL 10ML LL (SYRINGE) ×3 IMPLANT
TOWEL OR 17X24 6PK STRL BLUE (TOWEL DISPOSABLE) ×6 IMPLANT
TRAY DSU PREP LF (CUSTOM PROCEDURE TRAY) IMPLANT
UNDERPAD 30X30 (UNDERPADS AND DIAPERS) ×1 IMPLANT

## 2016-10-16 NOTE — Transfer of Care (Signed)
Immediate Anesthesia Transfer of Care Note  Patient: Donna Davis  Procedure(s) Performed: Procedure(s): RIGHT EXPLORATION OF LACERATION OF INDEX FINGER (Right)  Patient Location: PACU  Anesthesia Type:General  Level of Consciousness: awake, alert  and oriented  Airway & Oxygen Therapy: Patient Spontanous Breathing and Patient connected to face mask oxygen  Post-op Assessment: Report given to RN and Post -op Vital signs reviewed and stable  Post vital signs: Reviewed and stable  Last Vitals:  Vitals:   10/16/16 1338  BP: 128/70  Pulse: 76  Resp: 18  Temp: 36.8 C    Last Pain:  Vitals:   10/16/16 1338  TempSrc: Oral  PainSc: 8       Patients Stated Pain Goal: 4 ([id number])  Complications: No apparent anesthesia complications

## 2016-10-16 NOTE — Anesthesia Postprocedure Evaluation (Signed)
Anesthesia Post Note  Patient: Donna Davis  Procedure(s) Performed: Procedure(s) (LRB): RIGHT EXPLORATION OF LACERATION OF INDEX FINGER (Right)  Patient location during evaluation: PACU Anesthesia Type: General Level of consciousness: sedated and patient cooperative Pain management: pain level controlled Vital Signs Assessment: post-procedure vital signs reviewed and stable Respiratory status: spontaneous breathing Cardiovascular status: stable Anesthetic complications: no       Last Vitals:  Vitals:   10/16/16 1630 10/16/16 1711  BP: 121/83 122/79  Pulse: 71 84  Resp: 17 20  Temp:  36.7 C    Last Pain:  Vitals:   10/16/16 1711  TempSrc: Oral  PainSc: [place]

## 2016-10-16 NOTE — H&P (Signed)
Donna Davis is an 46 y.o. female.   Chief Complaint: right index finger laceration HPI: 46 yo female with laceration to right index finger 3 weeks ago.  Wound not managed at first.  Allowed to heal.  Now with difficulty with flexion.  She wishes to have operative exploration with repair tendon/artery/nerve as necessary.  Allergies:  Allergies  Allergen Reactions  . Onion Anaphylaxis  . Other Other (See Comments)    Ossmo Prep - drop in BP Steroids Went crazy  . Shellfish Allergy Anaphylaxis and Swelling  . Amitriptyline Anxiety, Palpitations and Other (See Comments)    hallucinations  . Amoxicillin-Pot Clavulanate Nausea And Vomiting    With high dose, low dose ok  . Amoxicillin-Pot Clavulanate Nausea And Vomiting    With high dose, low dose ok  . Cefuroxime Nausea And Vomiting  . Cephalexin Other (See Comments)     gi upset  gi upset  . Ciprofloxacin Other (See Comments)     Fevers  . Clarithromycin Nausea And Vomiting and Other (See Comments)    Pt is OK with azithromycin  gi upset  . Clarithromycin Nausea And Vomiting    Pt is OK with azithromycin  . Doxycycline Other (See Comments) and Hives     gi upset  gi upset  . Propranolol Other (See Comments)    Fatigue and makes her "feel crazy", refuses to take.  . Sulfonamide Derivatives Other (See Comments)    vaginal blisters  . Sulfa Antibiotics Other (See Comments)    Rash, vaginal blisters  . Benadryl [Diphenhydramine Hcl (Sleep)] Anxiety    Extreme agitation  . Shrimp Flavor Other (See Comments)    unknown    Past Medical History:  Diagnosis Date  . Anxiety   . Family history of malignant neoplasm of gastrointestinal tract   . GERD (gastroesophageal reflux disease)   . Hearing difficulty    b/l, 2/2 congenital rubella s/p stapedectomy. Using hearing aid  . Hepatic cyst    6 mm on CT done done in 05/2005  . Hypothyroidism   . IBS (irritable bowel syndrome)   . Migraines    migraine  . Shingles 03/2013     Past Surgical History:  Procedure Laterality Date  . ABDOMINAL ADHESION SURGERY     removal from bowel  . CESAREAN SECTION  1994  . CHOLECYSTECTOMY N/A 06/26/2013   Procedure: LAPAROSCOPIC CHOLECYSTECTOMY WITH ATTEMPTED INTRAOPERATIVE CHOLANGIOGRAM;  Surgeon: Harl Bowie, MD;  Location: [facility];  Service: General;  Laterality: N/A;  . COCHLEAR IMPLANT  03/30/2016  . COLONOSCOPY WITH PROPOFOL  04/23/2012  . DIAGNOSTIC LAPAROSCOPY  01/14/2006  . ESOPHAGOGASTRODUODENOSCOPY (EGD) WITH PROPOFOL  04/23/2012  . JOINT REPLACEMENT    . LAPAROSCOPIC OVARIAN CYSTECTOMY Right 04/16/2000  . SALPINGOOPHORECTOMY Left   . STAPEDECTOMY Left 05/14/2006  . Thyroid Nodule removed  1997  . TOTAL ABDOMINAL HYSTERECTOMY  01/13/2004    Family History: Family History  Problem Relation Age of Onset  . Breast cancer Mother   . Ovarian cancer Mother   . Diabetes Mother   . Pancreatic cancer Father   . Prostate cancer Father   . Colon cancer Father   . Diabetes Father   . Heart disease Father   . Irritable bowel syndrome Father   . Kidney disease Father   . Stomach cancer Maternal Grandmother   . Colon cancer Maternal Grandmother   . Diabetes Maternal Grandmother   . Heart disease      both grandmothers  .  Bipolar disorder Daughter     Social History:   reports that she quit smoking about 8 years ago. Her smoking use included Cigarettes. She has never used smokeless tobacco. She reports that she drinks alcohol. She reports that she does not use drugs.  Medications: Medications Prior to Admission  Medication Sig Dispense Refill  . acetaminophen (TYLENOL) 500 MG tablet Take 500 mg by mouth every 6 (six) hours as needed.    . ALPRAZolam (XANAX) 0.25 MG tablet TAKE ONE TABLET BY MOUTH ONCE DAILY AT BEDTIME AS NEEDED 30 tablet 0  . Ascorbic Acid (VITAMIN C PO) Take 1 tablet by mouth daily.    . busPIRone (BUSPAR) 10 MG tablet Take 5-20 mg by mouth 3 (three) times daily. Takes 1 tab in am,  1/2 tab at lunch ad 1-2 tabs at bedtime    . CALCIUM PO Take 1 tablet by mouth daily.    . Cholecalciferol (VITAMIN D PO) Take 1 tablet by mouth daily.    Marland Kitchen estradiol (ESTRACE) 1 MG tablet TAKE 1 TABLET EVERY DAY 30 tablet 10  . famotidine (PEPCID) 40 MG tablet Take 1 tablet (40 mg total) by mouth 2 (two) times daily as needed for heartburn or indigestion. 30 tablet 1  . HYDROcodone-acetaminophen (NORCO/VICODIN) 5-325 MG tablet Take 1-2 tablets by mouth every 4 (four) hours as needed for moderate pain. 20 tablet 0  . ibuprofen (ADVIL,MOTRIN) 800 MG tablet Take 1 tablet (800 mg total) by mouth every 8 (eight) hours as needed. 30 tablet 0  . levothyroxine (SYNTHROID, LEVOTHROID) 25 MCG tablet TAKE 1 TABLET BY MOUTH DAILY 31 tablet 11  . Omega-3 Fatty Acids (FISH OIL) 1000 MG CAPS Take 1,000 mg by mouth daily.    Marland Kitchen omeprazole (PRILOSEC) 20 MG capsule Take 1 capsule (20 mg total) by mouth daily. 60 capsule 0  . polyethylene glycol (MIRALAX / GLYCOLAX) packet Take 17 g by mouth 2 (two) times daily. Take 17g PO BID until you have daily soft stools, then reduce to once daily or every other day to maintain daily soft stools 14 each 0  . traMADol (ULTRAM) 50 MG tablet Take by mouth every 6 (six) hours as needed.    Marland Kitchen albuterol (PROVENTIL HFA;VENTOLIN HFA) 108 (90 BASE) MCG/ACT inhaler Inhale into the lungs every 6 (six) hours as needed for wheezing or shortness of breath.    Marland Kitchen ipratropium (ATROVENT) 0.06 % nasal spray Place 2 sprays into both nostrils 4 (four) times daily. For nasal congestion 15 mL 0  . loperamide (IMODIUM A-D) 2 MG capsule Take 2 tablets now. Then take 1 tablet as needed every time you have an episode of diarrhea. 30 capsule 0  . promethazine (PHENERGAN) 25 MG tablet Take 1 tablet (25 mg total) by mouth every 6 (six) hours as needed for nausea or vomiting. 30 tablet 0    No results found for this or any previous visit (from the past 48 hour(s)).  No results found.   A comprehensive  review of systems was negative.  Blood pressure 128/70, pulse 76, temperature 98.2 F (36.8 C), temperature source Oral, resp. rate 18, height 5\' 3"  (1.6 m), weight 93 kg (205 lb), last menstrual period 12/26/2012, SpO2 98 %.  General appearance: alert, cooperative and appears stated age Head: Normocephalic, without obvious abnormality, atraumatic Neck: supple, symmetrical, trachea midline Resp: clear to auscultation bilaterally Cardio: regular rate and rhythm GI: non-tender Extremities: Intact sensation and capillary refill all digits.  +epl/fpl/io.  Healed wound. Pulses: 2+  and symmetric Skin: Skin color, texture, turgor normal. No rashes or lesions Neurologic: Grossly normal Incision/Wound:none  Assessment/Plan Right index finger laceration with possible tendon/artery/nerve laceration.  Plan exploration with repair as necessary.  Non operative and operative treatment options were discussed with the patient and patient wishes to proceed with operative treatment. Risks, benefits, and alternatives of surgery were discussed and the patient agrees with the plan of care.   Lesieli Bresee R 10/16/2016, 2:47 PM

## 2016-10-16 NOTE — Anesthesia Preprocedure Evaluation (Signed)
Anesthesia Evaluation  Patient identified by MRN, date of birth, ID band Patient awake    Reviewed: Allergy & Precautions, H&P , NPO status , Patient's Chart, lab work & pertinent test results  Airway Mallampati: II  TM Distance: >3 FB Neck ROM: full    Dental no notable dental hx. (+) Teeth Intact, Dental Advisory Given   Pulmonary neg pulmonary ROS, former smoker,    Pulmonary exam normal breath sounds clear to auscultation       Cardiovascular Exercise Tolerance: Good negative cardio ROS Normal cardiovascular exam Rhythm:regular Rate:Normal     Neuro/Psych  Headaches, Anxiety Depression deaf negative neurological ROS  negative psych ROS   GI/Hepatic negative GI ROS, Neg liver ROS, GERD  ,  Endo/Other  negative endocrine ROSHypothyroidism   Renal/GU negative Renal ROS     Musculoskeletal   Abdominal   Peds  Hematology negative hematology ROS (+)   Anesthesia Other Findings   Reproductive/Obstetrics negative OB ROS                             Anesthesia Physical  Anesthesia Plan  ASA: II  Anesthesia Plan: General   Post-op Pain Management:    Induction: Intravenous  Airway Management Planned: LMA  Additional Equipment:   Intra-op Plan:   Post-operative Plan: Extubation in OR  Informed Consent: I have reviewed the patients History and Physical, chart, labs and discussed the procedure including the risks, benefits and alternatives for the proposed anesthesia with the patient or authorized representative who has indicated his/her understanding and acceptance.   Dental advisory given  Plan Discussed with: CRNA  Anesthesia Plan Comments:         Anesthesia Quick Evaluation

## 2016-10-16 NOTE — Brief Op Note (Signed)
10/16/2016  3:47 PM  PATIENT:  Donna Davis  47 y.o. female  PRE-OPERATIVE DIAGNOSIS:  RIGHT INDEX LACERATION  POST-OPERATIVE DIAGNOSIS:  RIGHT INDEX LACERATION  PROCEDURE:  Procedure(s): RIGHT EXPLORATION OF LACERATION OF INDEX FINGER (Right)  SURGEON:  Surgeon(s) and Role:    * Leanora Cover, MD - Primary    * Daryll Brod, MD - Assisting  PHYSICIAN ASSISTANT:   ASSISTANTS: Daryll Brod, MD   ANESTHESIA:   general  EBL:  Total I/O In: -  Out: 5 [Blood:5]  BLOOD ADMINISTERED:none  DRAINS: none   LOCAL MEDICATIONS USED:  MARCAINE     SPECIMEN:  No Specimen  DISPOSITION OF SPECIMEN:  N/A  COUNTS:  YES  TOURNIQUET:   Total Tourniquet Time Documented: Upper Arm (Right) - 15 minutes Total: Upper Arm (Right) - 15 minutes   DICTATION: .Other Dictation: Dictation Number [id number]  PLAN OF CARE: Discharge to home after PACU  PATIENT DISPOSITION:  PACU - hemodynamically stable.

## 2016-10-16 NOTE — Discharge Instructions (Addendum)
Call your surgeon if you experience:   1.  Fever over 101.0. 2.  Inability to urinate. 3.  Nausea and/or vomiting. 4.  Extreme swelling or bruising at the surgical site. 5.  Continued bleeding from the incision. 6.  Increased pain, redness or drainage from the incision. 7.  Problems related to your pain medication. 8.  Any problems and/or concerns Post Anesthesia Home Care Instructions  Activity: Get plenty of rest for the remainder of the day. A responsible adult should stay with you for 24 hours following the procedure.  For the next 24 hours, DO NOT: -Drive a car -Paediatric nurse -Drink alcoholic beverages -Take any medication unless instructed by your physician -Make any legal decisions or sign important papers.  Meals: Start with liquid foods such as gelatin or soup. Progress to regular foods as tolerated. Avoid greasy, spicy, heavy foods. If nausea and/or vomiting occur, drink only clear liquids until the nausea and/or vomiting subsides. Call your physician if vomiting continues.  Special Instructions/Symptoms: Your throat may feel dry or sore from the anesthesia or the breathing tube placed in your throat during surgery. If this causes discomfort, gargle with warm salt water. The discomfort should disappear within 24 hours.  If you had a scopolamine patch placed behind your ear for the management of post- operative nausea and/or vomiting:  1. The medication in the patch is effective for 72 hours, after which it should be removed.  Wrap patch in a tissue and discard in the trash. Wash hands thoroughly with soap and water. 2. You may remove the patch earlier than 72 hours if you experience unpleasant side effects which may include dry mouth, dizziness or visual disturbances. 3. Avoid touching the patch. Wash your hands with soap and water after contact with the patch.     [facility] Instructions Hand Surgery  Wound Care: Keep your hand elevated above the level of your  heart.  Do not allow it to dangle by your side.  Keep the dressing dry and do not remove it unless your doctor advises you to do so.  He will usually change it at the time of your post-op visit.  Moving your fingers is advised to stimulate circulation but will depend on the site of your surgery.  If you have a splint applied, your doctor will advise you regarding movement.  Activity: Do not drive or operate machinery today.  Rest today and then you may return to your normal activity and work as indicated by your physician.  Diet:  Drink liquids today or eat a light diet.  You may resume a regular diet tomorrow.    General expectations: Pain for two to three days. Fingers may become slightly swollen.  Call your doctor if any of the following occur: Severe pain not relieved by pain medication. Elevated temperature. Dressing soaked with blood. Inability to move fingers. White or bluish color to fingers.

## 2016-10-16 NOTE — Op Note (Signed)
I assisted Surgeon(s) and Role:    * Leanora Cover, MD - Primary    * Daryll Brod, MD - Assisting on the Procedure(s): RIGHT EXPLORATION OF LACERATION OF INDEX FINGER on 10/16/2016.  I provided assistance on this case as follows: exploration and repair.  Electronically signed by: Wynonia Sours, MD Date: 10/16/2016 Time: 3:49 PM

## 2016-10-16 NOTE — Anesthesia Procedure Notes (Signed)
Date/Time: 10/16/2016 3:11 PM Performed by: Melynda Ripple D

## 2016-10-17 ENCOUNTER — Ambulatory Visit: Payer: Medicare Other

## 2016-10-17 ENCOUNTER — Encounter (HOSPITAL_BASED_OUTPATIENT_CLINIC_OR_DEPARTMENT_OTHER): Payer: Self-pay | Admitting: Orthopedic Surgery

## 2016-10-17 NOTE — Addendum Note (Signed)
Addendum  created 10/17/16 [id number] by Maryella Shivers, CRNA   Charge Capture section accepted

## 2016-10-17 NOTE — Op Note (Signed)
NAMEWILDA, Donna [name]:  [id number]  MEDICAL RECORD NO.:  [id number]  LOCATION:                                 FACILITY:  PHYSICIAN:  Leanora Cover, MD             DATE OF BIRTH:  DATE OF PROCEDURE:  10/16/2016 DATE OF DISCHARGE:                              OPERATIVE REPORT   PREOPERATIVE DIAGNOSIS:  Right index finger laceration with possible tendon, artery, and nerve laceration.  POSTOPERATIVE DIAGNOSIS:  Right index finger laceration with possible tendon, artery, and nerve laceration.  PROCEDURE:  Right index finger laceration.  SURGEON:  Leanora Cover, MD.  ASSISTANT:  Daryll Brod, MD.  ANESTHESIA:  General.  IV FLUIDS:  Per anesthesia flow sheet.  ESTIMATED BLOOD LOSS:  Minimal.  COMPLICATIONS:  None.  SPECIMENS:  None.  TOURNIQUET TIME:  15 minutes.  DISPOSITION:  Stable to PACU.  INDICATIONS:  [name] is a 46 year old female, who lacerated her right index finger approximately 4 weeks ago.  She has had difficulty with flexion of the PIP joint.  I recommended operative exploration of the wound with repair of tendon, artery, and nerve as necessary.  Risks, benefits, and alternatives of surgery were discussed including the risk of blood loss; infection; damage to nerves, vessels, tendons, ligaments, bone for surgery, need for additional surgery; complications with wound healing; and continued pain.  She voiced understanding of these risks and elected to proceed.  OPERATIVE COURSE:  After being identified preoperatively by myself, the patient, and I agreed upon procedure and site of procedure.  Surgical site was marked.  The risks, benefits, and alternatives of surgery were reviewed and she wished to proceed.  Surgical consent had been signed. She was given IV Ancef as her IV vancomycin as preoperative antibiotic prophylaxis.  She was transferred to the operating room and placed in the operating room table in supine position with the  right upper extremity on arm board.  General anesthesia induced by anesthesiologist. The right upper extremity was prepped and draped in normal sterile orthopedic fashion.  Surgical pause was performed between surgeons, anesthesia, and operating staff and all were in agreement as to the patient, procedure, and site of procedure.  Tourniquet at the proximal aspect of the extremity was inflated to 250 mmHg after exsanguination of the limb with an Esmarch bandage.  A Brunner-type incision was made in the index finger including the traumatic portion of the wound, which had healed.  This was carried into subcutaneous tissues by spreading technique.  The flexor tendon was identified and was intact.  There was some laceration to the A5 pulley.  The radial digital nerve and artery were identified and were intact.  The digital nerve could be seen trifurcate and all branches were intact.  The ulnar digital nerve and artery were identified and were intact as well.  The wound was copiously irrigated with sterile saline.  It was closed with 4-0 nylon in a horizontal mattress fashion.  It was dressed with sterile Xeroform, 4 x 4, and wrapped with a Coban dressing lightly.  A digital block was performed with 9.5 mL of 0.25% plain  Marcaine to aid in postoperative analgesia.  The tourniquet was deflated at 15 minutes.  All fingers were pink with brisk capillary refill after deflation of tourniquet.  The operative drapes were broken down, the patient was awoken from anesthesia safely.  She was transferred back to stretcher and taken to PACU in stable condition.  I will see her back in the office in 1 week for postoperative followup.  I will give her Norco 5/325, 1-2 p.o. q.6 hours p.r.n. pain, dispensed #20.     Leanora Cover, MD     KK/MEDQ  D:  10/16/2016  T:  10/17/2016  Job:  [id number]

## 2016-10-30 DIAGNOSIS — H903 Sensorineural hearing loss, bilateral: Secondary | ICD-10-CM | POA: Diagnosis not present

## 2016-11-02 ENCOUNTER — Other Ambulatory Visit: Payer: Self-pay

## 2016-11-02 NOTE — Telephone Encounter (Signed)
traMADol (ULTRAM) 50 MG tablet, refill request @ [facility] on [address]

## 2016-11-06 DIAGNOSIS — M9901 Segmental and somatic dysfunction of cervical region: Secondary | ICD-10-CM | POA: Diagnosis not present

## 2016-11-06 DIAGNOSIS — F411 Generalized anxiety disorder: Secondary | ICD-10-CM | POA: Diagnosis not present

## 2016-11-06 DIAGNOSIS — M50322 Other cervical disc degeneration at C5-C6 level: Secondary | ICD-10-CM | POA: Diagnosis not present

## 2016-11-08 ENCOUNTER — Encounter: Payer: Self-pay | Admitting: Internal Medicine

## 2016-11-08 MED ORDER — TRAMADOL HCL 50 MG PO TABS
50.0000 mg | ORAL_TABLET | Freq: Four times a day (QID) | ORAL | 0 refills | Status: DC | PRN
Start: 2016-11-08 — End: 2016-12-05

## 2016-11-08 NOTE — Telephone Encounter (Signed)
I reviewed [place] narcotic database for Ms. Wachs.  This is a tricky situation in that Ms. Scardino receives tramadol from me for migraines (we have tried multiple other medications without success) and has recently had orthopedic issues for which she has received hydrocodone based products from another provider.  I sent Ms. Arciniega a message about mixing the two and possible need to change to another medication if she should continue to require hydrocodone.  I will see her in clinic at next appointment to discuss further.

## 2016-11-08 NOTE — Telephone Encounter (Signed)
Called to pharm 

## 2016-11-09 DIAGNOSIS — M9901 Segmental and somatic dysfunction of cervical region: Secondary | ICD-10-CM | POA: Diagnosis not present

## 2016-11-09 DIAGNOSIS — M25641 Stiffness of right hand, not elsewhere classified: Secondary | ICD-10-CM | POA: Diagnosis not present

## 2016-11-09 DIAGNOSIS — M50322 Other cervical disc degeneration at C5-C6 level: Secondary | ICD-10-CM | POA: Diagnosis not present

## 2016-11-12 ENCOUNTER — Other Ambulatory Visit: Payer: Self-pay | Admitting: Internal Medicine

## 2016-11-13 ENCOUNTER — Other Ambulatory Visit: Payer: Self-pay | Admitting: Internal Medicine

## 2016-11-13 MED ORDER — ALPRAZOLAM 0.25 MG PO TABS: 0.2500 mg | ORAL_TABLET | Freq: Every evening | ORAL | 0 refills | Status: DC | PRN

## 2016-11-13 NOTE — Telephone Encounter (Signed)
Called to pharm 

## 2016-11-28 DIAGNOSIS — M9902 Segmental and somatic dysfunction of thoracic region: Secondary | ICD-10-CM | POA: Diagnosis not present

## 2016-11-28 DIAGNOSIS — M5134 Other intervertebral disc degeneration, thoracic region: Secondary | ICD-10-CM | POA: Diagnosis not present

## 2016-12-01 ENCOUNTER — Ambulatory Visit (HOSPITAL_COMMUNITY)
Admission: EM | Admit: 2016-12-01 | Discharge: 2016-12-01 | Disposition: A | Discharge status: Home / Self Care | Payer: Medicare Other | Attending: Family Medicine | Admitting: Family Medicine

## 2016-12-01 ENCOUNTER — Encounter (HOSPITAL_COMMUNITY): Payer: Self-pay | Admitting: Emergency Medicine

## 2016-12-01 DIAGNOSIS — J0100 Acute maxillary sinusitis, unspecified: Secondary | ICD-10-CM | POA: Diagnosis not present

## 2016-12-01 MED ORDER — PREDNISONE 50 MG PO TABS: ORAL_TABLET | ORAL | 0 refills | Status: DC

## 2016-12-01 MED ORDER — CLINDAMYCIN HCL 300 MG PO CAPS: 300.0000 mg | ORAL_CAPSULE | Freq: Three times a day (TID) | ORAL | 0 refills | Status: DC

## 2016-12-01 NOTE — Discharge Instructions (Signed)
I am treating you today for acute sinusitis. I have prescribed an antibiotic called Clindamycin, take 1 tablet 3 times a day with food. For swelling, and inflammation, I prescribed a medicine called prednisone, take one tablet daily with food. Should your symptoms persist, or fail to resolve, follow up with your primary care provider, or return to clinic as needed.

## 2016-12-01 NOTE — ED Triage Notes (Addendum)
Last night 3 top teeth started hurting , right side of face feels "asleep", including face and scalp.    Patient reports having a sinus infection for a month, secretions have been yellowish green to green.  Unknown if patient has had a fever.    Cochlear implant placed this past July 2017  Also has dizziness, history of the same

## 2016-12-01 NOTE — ED Provider Notes (Signed)
CSN: [id number]     Arrival date & time 12/01/16  1546 History   None    Chief Complaint  Patient presents with  . Headache   (Consider location/radiation/quality/duration/timing/severity/associated sxs/prior Treatment) 46 year old female presents to clinic for evaluation of right sided facial swelling, teeth pain, congestion, and numbness. Symptoms have been ongoing for approximately 1 month, however worsened in the last 24-48 hours to include a new onset of dizziness. Patient does have a past history of vertigo, however states that this dizziness is worse than previous episodes of vertigo. She is also complaining of a feeling of pressure in her right ear as well. Patient has a cochlear implant, and it is on her right side. She has had no fever, nausea, vomiting, abdominal pain, chest pain, shortness of breath, or other symptoms. She does state she has been treating herself for a sinus infection for the last month with antihistamines, and decongestants, with minimal relief.   The history is provided by the patient.    Past Medical History:  Diagnosis Date  . Anxiety   . Family history of malignant neoplasm of gastrointestinal tract   . GERD (gastroesophageal reflux disease)   . Hearing difficulty    b/l, 2/2 congenital rubella s/p stapedectomy. Using hearing aid  . Hepatic cyst    6 mm on CT done done in 05/2005  . Hypothyroidism   . IBS (irritable bowel syndrome)   . Migraines    migraine  . Shingles 03/2013   Past Surgical History:  Procedure Laterality Date  . ABDOMINAL ADHESION SURGERY     removal from bowel  . CESAREAN SECTION  1994  . CHOLECYSTECTOMY N/A 06/26/2013   Procedure: LAPAROSCOPIC CHOLECYSTECTOMY WITH ATTEMPTED INTRAOPERATIVE CHOLANGIOGRAM;  Surgeon: Harl Bowie, MD;  Location: [facility];  Service: General;  Laterality: N/A;  . COCHLEAR IMPLANT  03/30/2016  . COLONOSCOPY WITH PROPOFOL  04/23/2012  . DIAGNOSTIC LAPAROSCOPY  01/14/2006  .  ESOPHAGOGASTRODUODENOSCOPY (EGD) WITH PROPOFOL  04/23/2012  . JOINT REPLACEMENT    . LAPAROSCOPIC OVARIAN CYSTECTOMY Right 04/16/2000  . SALPINGOOPHORECTOMY Left   . STAPEDECTOMY Left 05/14/2006  . Thyroid Nodule removed  1997  . TOTAL ABDOMINAL HYSTERECTOMY  01/13/2004  . WOUND EXPLORATION Right 10/16/2016   Procedure: RIGHT EXPLORATION OF LACERATION OF INDEX FINGER;  Surgeon: Leanora Cover, MD;  Location: [facility];  Service: Orthopedics;  Laterality: Right;   Family History  Problem Relation Age of Onset  . Breast cancer Mother   . Ovarian cancer Mother   . Diabetes Mother   . Pancreatic cancer Father   . Prostate cancer Father   . Colon cancer Father   . Diabetes Father   . Heart disease Father   . Irritable bowel syndrome Father   . Kidney disease Father   . Stomach cancer Maternal Grandmother   . Colon cancer Maternal Grandmother   . Diabetes Maternal Grandmother   . Heart disease      both grandmothers  . Bipolar disorder Daughter    Social History  Substance Use Topics  . Smoking status: Former Smoker    Types: Cigarettes    Quit date: 02/18/2008  . Smokeless tobacco: Never Used     Comment: quit 76yrs ago  . Alcohol use 0.0 oz/week     Comment: social   OB History    Gravida Para Term Preterm AB Living   3 2 2   1 2    SAB TAB Ectopic Multiple Live Births  1           Review of Systems  Reason unable to perform ROS: as covered in HPI.  All other systems reviewed and are negative.   Allergies  Onion; Other; Shellfish allergy; Amitriptyline; Amoxicillin-pot clavulanate; Amoxicillin-pot clavulanate; Cefuroxime; Cephalexin; Ciprofloxacin; Clarithromycin; Clarithromycin; Doxycycline; Propranolol; Sulfonamide derivatives; Sulfa antibiotics; Benadryl [diphenhydramine hcl (sleep)]; and Shrimp flavor  Home Medications   Prior to Admission medications   Medication Sig Start Date End Date Taking? Authorizing Provider  albuterol (PROVENTIL  HFA;VENTOLIN HFA) 108 (90 BASE) MCG/ACT inhaler Inhale into the lungs every 6 (six) hours as needed for wheezing or shortness of breath.    Historical Provider, MD  ALPRAZolam Duanne Moron) 0.25 MG tablet Take 1 tablet (0.25 mg total) by mouth at bedtime as needed for anxiety. 11/13/16   Sid Falcon, MD  Ascorbic Acid (VITAMIN C PO) Take 1 tablet by mouth daily.    Historical Provider, MD  busPIRone (BUSPAR) 10 MG tablet Take 5-20 mg by mouth 3 (three) times daily. Takes 1 tab in am, 1/2 tab at lunch ad 1-2 tabs at bedtime    Historical Provider, MD  CALCIUM PO Take 1 tablet by mouth daily.    Historical Provider, MD  Cholecalciferol (VITAMIN D PO) Take 1 tablet by mouth daily.    Historical Provider, MD  clindamycin (CLEOCIN) 300 MG capsule Take 1 capsule (300 mg total) by mouth 3 (three) times daily. 12/01/16   Barnet Glasgow, NP  estradiol (ESTRACE) 1 MG tablet TAKE 1 TABLET EVERY DAY 05/14/16   Osborne Oman, MD  famotidine (PEPCID) 40 MG tablet Take 1 tablet (40 mg total) by mouth 2 (two) times daily as needed for heartburn or indigestion. 08/27/16   Asencion Partridge, MD  HYDROcodone-acetaminophen [facility]) 5-325 MG tablet 1-2 tabs po q6 hours prn pain 10/16/16   Leanora Cover, MD  HYDROcodone-acetaminophen (NORCO/VICODIN) 5-325 MG tablet Take 1-2 tablets by mouth every 4 (four) hours as needed for moderate pain. 09/20/16   Melynda Ripple, MD  ibuprofen (ADVIL,MOTRIN) 800 MG tablet Take 1 tablet (800 mg total) by mouth every 8 (eight) hours as needed. 09/20/16   Melynda Ripple, MD  ipratropium (ATROVENT) 0.06 % nasal spray Place 2 sprays into both nostrils 4 (four) times daily. For nasal congestion 01/12/15   Lutricia Feil, PA  levothyroxine (SYNTHROID, LEVOTHROID) 25 MCG tablet TAKE 1 TABLET BY MOUTH DAILY 04/18/16   Aldine Contes, MD  loperamide (IMODIUM A-D) 2 MG capsule Take 2 tablets now. Then take 1 tablet as needed every time you have an episode of diarrhea. 03/07/16   Dellia Nims, MD   Omega-3 Fatty Acids (FISH OIL) 1000 MG CAPS Take 1,000 mg by mouth daily.    Historical Provider, MD  omeprazole (PRILOSEC) 20 MG capsule Take 1 capsule (20 mg total) by mouth daily. 03/04/16   Forde Dandy, MD  polyethylene glycol [facility] Floria Raveling) packet Take 17 g by mouth 2 (two) times daily. Take 17g PO BID until you have daily soft stools, then reduce to once daily or every other day to maintain daily soft stools 06/12/15   Mercedes Street, PA-C  predniSONE (DELTASONE) 50 MG tablet Take 1 tablet daily with food 12/01/16   Barnet Glasgow, NP  promethazine (PHENERGAN) 25 MG tablet Take 1 tablet (25 mg total) by mouth every 6 (six) hours as needed for nausea or vomiting. 12/09/15   Sid Falcon, MD  traMADol (ULTRAM) 50 MG tablet Take 1 tablet (50 mg total)  by mouth every 6 (six) hours as needed. 11/08/16   Sid Falcon, MD   Meds Ordered and Administered this Visit  Medications - No data to display  BP (!) 143/76 (BP Location: Right Arm) Comment (BP Location): large cuff  Pulse 78   Temp 99.1 F (37.3 C) (Oral)   Resp 18   LMP 12/26/2012   SpO2 98%  No data found.   Physical Exam  Constitutional: She is oriented to person, place, and time. She appears well-developed and well-nourished. No distress.  HENT:  Head: Normocephalic and atraumatic.  Right Ear: External ear normal. Tympanic membrane is bulging.  Left Ear: External ear normal.  Nose: Mucosal edema present. Right sinus exhibits maxillary sinus tenderness and frontal sinus tenderness.  Mouth/Throat: Uvula is midline, oropharynx is clear and moist and mucous membranes are normal.  Visible unilateral swelling on the right side of the face. Erythema noted  Surgical scar over her cochlear implant inspected, no sign of infection present.  Eyes: EOM are normal. Pupils are equal, round, and reactive to light.  Neck: Normal range of motion. Neck supple. No JVD present.  Cardiovascular: Normal rate and regular rhythm.    Pulmonary/Chest: Effort normal and breath sounds normal.  Abdominal: Soft. Bowel sounds are normal.  Musculoskeletal: She exhibits no edema or tenderness.  Lymphadenopathy:       Head (right side): No submental, no submandibular, no tonsillar and no preauricular adenopathy present.       Head (left side): No submental, no submandibular, no tonsillar and no preauricular adenopathy present.    She has no cervical adenopathy.  Neurological: She is alert and oriented to person, place, and time.  Skin: Skin is warm and dry. Capillary refill takes less than 2 seconds. She is not diaphoretic.  Psychiatric: She has a normal mood and affect. Her behavior is normal.  Nursing note and vitals reviewed.   Urgent Care Course     Procedures (including critical care time)  Labs Review Labs Reviewed - No data to display  Imaging Review No results found.     MDM   1. Acute maxillary sinusitis, recurrence not specified    Patient treated with clindamycin for acute sinusitis, patient has an extensive list of drug allergies, including many as first line antibiotics. She is also given prescription for prednisone, instructed on over-the-counter medicines to help with her symptoms. Advised to  follow up with her primary care provider should her symptoms persist.      Barnet Glasgow, NP 12/01/16 [phone]

## 2016-12-05 ENCOUNTER — Ambulatory Visit (INDEPENDENT_AMBULATORY_CARE_PROVIDER_SITE_OTHER): Payer: Medicare Other | Admitting: Internal Medicine

## 2016-12-05 VITALS — BP 144/85 | HR 79 | Temp 97.8°F | Ht 63.0 in | Wt 214.9 lb

## 2016-12-05 DIAGNOSIS — K0889 Other specified disorders of teeth and supporting structures: Secondary | ICD-10-CM

## 2016-12-05 DIAGNOSIS — B9689 Other specified bacterial agents as the cause of diseases classified elsewhere: Secondary | ICD-10-CM | POA: Insufficient documentation

## 2016-12-05 DIAGNOSIS — Z881 Allergy status to other antibiotic agents status: Secondary | ICD-10-CM

## 2016-12-05 DIAGNOSIS — J329 Chronic sinusitis, unspecified: Principal | ICD-10-CM

## 2016-12-05 DIAGNOSIS — M9902 Segmental and somatic dysfunction of thoracic region: Secondary | ICD-10-CM | POA: Diagnosis not present

## 2016-12-05 DIAGNOSIS — Z87891 Personal history of nicotine dependence: Secondary | ICD-10-CM

## 2016-12-05 DIAGNOSIS — M5134 Other intervertebral disc degeneration, thoracic region: Secondary | ICD-10-CM | POA: Diagnosis not present

## 2016-12-05 DIAGNOSIS — G43009 Migraine without aura, not intractable, without status migrainosus: Secondary | ICD-10-CM

## 2016-12-05 DIAGNOSIS — Z79891 Long term (current) use of opiate analgesic: Secondary | ICD-10-CM | POA: Diagnosis not present

## 2016-12-05 DIAGNOSIS — G43909 Migraine, unspecified, not intractable, without status migrainosus: Secondary | ICD-10-CM | POA: Diagnosis not present

## 2016-12-05 DIAGNOSIS — J328 Other chronic sinusitis: Secondary | ICD-10-CM

## 2016-12-05 DIAGNOSIS — Z9621 Cochlear implant status: Secondary | ICD-10-CM | POA: Diagnosis not present

## 2016-12-05 MED ORDER — TRAMADOL HCL 50 MG PO TABS: 50.0000 mg | ORAL_TABLET | Freq: Four times a day (QID) | ORAL | 0 refills | Status: DC | PRN

## 2016-12-05 NOTE — Progress Notes (Signed)
CC: sinusitis  HPI:  [name] is a 46 y.o. with a PMH of anxiety, GERD, bilateral sensorineural hearing loss 2/2 congenital rubella s/p R cochlear implant, migraines, hypothyroidism presenting to clinic for follow up on sinusitis.  Patient reports one month history of sinus problems which she has attempted to treat symptomatically at home with some success, however, her symptoms were getting worse with purulent nasal drainage, and right sided headaches with sinus pain. She additionally had developed feeling of water dripping in her right ear as well as a deep sharp pain that she states is different from her sinus pain and very similar to the pain she had right after her cochlear implant had been placed. She visited urgent care on 3/17 for the sinus symptoms and this pain and was prescribed a course of clindamycin (due to her multiple adverse reactions to first line antibiotics) as well as prednisone. She reports she still has some sinus pressure and headaches which are improving and her nasal drainage is turning clear. She endorse a fever to 102F two days ago, and feels like she has had fevers the last two nights due to extreme chills but has not taken her temp.   Please see problem based Assessment and Plan for status of patients chronic conditions.  Past Medical History:  Diagnosis Date  . Anxiety   . Family history of malignant neoplasm of gastrointestinal tract   . GERD (gastroesophageal reflux disease)   . Hearing difficulty    b/l, 2/2 congenital rubella s/p stapedectomy. Using hearing aid  . Hepatic cyst    6 mm on CT done done in 05/2005  . Hypothyroidism   . IBS (irritable bowel syndrome)   . Migraines    migraine  . Shingles 03/2013    Review of Systems:   Review of Systems  Constitutional: Positive for chills and fever. Negative for malaise/fatigue.  HENT: Positive for hearing loss and sinus pain (right sided). Negative for congestion, ear discharge and tinnitus.       Ear fullness on the right  Eyes: Negative for blurred vision, double vision and pain.  Respiratory: Positive for cough (mild, nonproductive). Negative for shortness of breath and wheezing.   Cardiovascular: Negative for chest pain.  Skin: Negative for rash.  Neurological: Positive for dizziness (chronic) and headaches (right sided 2/2 sinus pain). Negative for sensory change.    Physical Exam:  Vitals:   12/05/16 1013  BP: (!) 144/110  Pulse: 79  Temp: 97.8 F (36.6 C)  TempSrc: Oral  SpO2: 100%  Weight: 214 lb 14.4 oz (97.5 kg)  Height: 5\' 3"  (1.6 m)   Physical Exam  Constitutional: She is oriented to person, place, and time. She appears well-developed and well-nourished. No distress.  HENT:  Head: Normocephalic and atraumatic.  Nose: Nose normal.  Mouth/Throat: Oropharynx is clear and moist. No oropharyngeal exudate.  Dull tympanic membranes bilaterally; no ear tenderness, effusion or lesions. Site of cochlear implant on right is clean, nontender, nonerythematous. Poor dentition, no appreciable oral abscesses or signs of infection Sinus tenderness over right frontal, maxillary and mastoid sinuses without appreciable soft tissue swelling or fluctuance  Eyes: Conjunctivae and EOM are normal. Pupils are equal, round, and reactive to light.  Neck: Normal range of motion. Neck supple.  No lymphadenopathy of anterior/posterior cervical chains, supraclavicular, pre/post auricular  Cardiovascular: Normal rate, regular rhythm and normal heart sounds.  Exam reveals no gallop and no friction rub.   No murmur heard. Pulmonary/Chest: Effort normal and  breath sounds normal. She has no wheezes. She has no rales.  Lymphadenopathy:    She has no cervical adenopathy.  Neurological: She is alert and oriented to person, place, and time. No cranial nerve deficit. Coordination normal.  Skin: Skin is warm and dry. Capillary refill takes less than 2 seconds. She is not diaphoretic.    Psychiatric: She has a normal mood and affect. Her behavior is normal. Judgment and thought content normal.    Assessment & Plan:   See Encounters Tab for problem based charting.   Patient discussed with Dr. Carmel Sacramento, MD Internal Medicine PGY1

## 2016-12-05 NOTE — Assessment & Plan Note (Signed)
Stable. Refilled tramadol 50mg  every 6 hrs as needed #30. Database checked and appropriate. She has completed courses of hydrocodone that she received for her orthopedic surgeries in January.

## 2016-12-05 NOTE — Patient Instructions (Signed)
For your sinusitis, continue taking the clindamycin and prednisone until you are finished with the courses.   We are going to get imaging of your head to evaluate for deeper infection; please have them give you a disc of your imaging so that way you can give to your ear doctor.

## 2016-12-05 NOTE — Assessment & Plan Note (Signed)
Patient with right cochlear implant in place since Ayrianna 2017. She presents with improving sinusitis symptoms but persistent "deep" pain that she states is same as she had right after implant placement.   Exam reveals tenderness over right frontal, maxillary and mastoid sinuses without soft tissue edema or fluctuance. Poor dentition but no evidence of dental abscess. Implant site without tenderness or signs of infection externally  Plan: --CT maxillofacial w contrast for evaluation for abscess and implant complications --patient has f/u with [facility] in two weeks, told to bring in disc of imaging and f/u sooner with them if not improving

## 2016-12-05 NOTE — Progress Notes (Signed)
Internal Medicine Clinic Attending  Case discussed with Dr. Svalina  at the time of the visit.  We reviewed the resident's history and exam and pertinent patient test results.  I agree with the assessment, diagnosis, and plan of care documented in the resident's note.  

## 2016-12-05 NOTE — Assessment & Plan Note (Signed)
Patient reports one month history of sinus problems which she has attempted to treat symptomatically at home with some success, however, her symptoms were getting worse with purulent nasal drainage, and right sided headaches with sinus pain. She additionally had developed feeling of water dripping in her right ear as well as a deep sharp pain that she states is different from her sinus pain and very similar to the pain she had right after her cochlear implant had been placed. She visited urgent care on 3/17 for the sinus symptoms and this pain and was prescribed a course of clindamycin (due to her multiple adverse reactions to first line antibiotics) as well as prednisone. She reports she still has some sinus pressure and headaches which are improving and her nasal drainage is turning clear. She endorse a fever to 102F two days ago, and feels like she has had fevers the last two nights due to extreme chills but has not taken her temp.  Exam reveals tenderness over right frontal, maxillary and mastoid sinuses without soft tissue edema or fluctuance. Poor dentition but no evidence of dental abscess. Implant site without tenderness or signs of infection externally  Plan: --continue clindamycin and prednisone for sinusitis --CT maxillofacial w contrast for evaluation for abscess

## 2016-12-10 ENCOUNTER — Other Ambulatory Visit: Payer: Self-pay | Admitting: Internal Medicine

## 2016-12-10 MED ORDER — ALPRAZOLAM 0.25 MG PO TABS: 0.2500 mg | ORAL_TABLET | Freq: Every evening | ORAL | 3 refills | Status: DC | PRN

## 2016-12-10 NOTE — Telephone Encounter (Signed)
Called to pharm 

## 2016-12-11 ENCOUNTER — Encounter: Payer: Self-pay | Admitting: Internal Medicine

## 2016-12-11 ENCOUNTER — Other Ambulatory Visit: Payer: Self-pay | Admitting: Internal Medicine

## 2016-12-11 ENCOUNTER — Ambulatory Visit (HOSPITAL_COMMUNITY)
Admission: RE | Admit: 2016-12-11 | Discharge: 2016-12-11 | Disposition: A | Discharge status: Home / Self Care | Payer: Medicare Other | Source: Ambulatory Visit | Attending: Internal Medicine | Admitting: Internal Medicine

## 2016-12-11 DIAGNOSIS — B9689 Other specified bacterial agents as the cause of diseases classified elsewhere: Secondary | ICD-10-CM | POA: Insufficient documentation

## 2016-12-11 DIAGNOSIS — J329 Chronic sinusitis, unspecified: Secondary | ICD-10-CM | POA: Insufficient documentation

## 2016-12-11 DIAGNOSIS — M9902 Segmental and somatic dysfunction of thoracic region: Secondary | ICD-10-CM | POA: Diagnosis not present

## 2016-12-11 DIAGNOSIS — Z9621 Cochlear implant status: Secondary | ICD-10-CM | POA: Diagnosis not present

## 2016-12-11 DIAGNOSIS — J342 Deviated nasal septum: Secondary | ICD-10-CM | POA: Diagnosis not present

## 2016-12-11 DIAGNOSIS — R51 Headache: Secondary | ICD-10-CM | POA: Diagnosis not present

## 2016-12-11 DIAGNOSIS — M5134 Other intervertebral disc degeneration, thoracic region: Secondary | ICD-10-CM | POA: Diagnosis not present

## 2016-12-11 MED ORDER — PREDNISONE 50 MG PO TABS: ORAL_TABLET | ORAL | 0 refills | Status: DC

## 2016-12-11 MED ORDER — IOPAMIDOL (ISOVUE-300) INJECTION 61%
INTRAVENOUS | Status: AC
  Administered 2016-12-11: 75 mL
  Filled 2016-12-11: qty 75

## 2016-12-11 NOTE — Progress Notes (Signed)
Ms. Shoaf sent me a message that her sinus symptoms had returned.  Reviewed CT max/face and no signs of infection noted on that imaging study.  I am inclined to continue her course of steroids, but not antibiotics at this time as any acute or chronic sinusitis would be apparent on this study.  I have advised her to return to the clinic for any worsening of symptoms, pain or fever.    She is due to see her ENT doctor in 1 week, as per chart review.  She will contact the clinic before then for any issues.    Gilles Chiquito, MD

## 2016-12-17 DIAGNOSIS — M9902 Segmental and somatic dysfunction of thoracic region: Secondary | ICD-10-CM | POA: Diagnosis not present

## 2016-12-17 DIAGNOSIS — M5134 Other intervertebral disc degeneration, thoracic region: Secondary | ICD-10-CM | POA: Diagnosis not present

## 2016-12-19 ENCOUNTER — Encounter: Payer: Self-pay | Admitting: Internal Medicine

## 2016-12-19 DIAGNOSIS — H9193 Unspecified hearing loss, bilateral: Secondary | ICD-10-CM | POA: Diagnosis not present

## 2016-12-19 DIAGNOSIS — J301 Allergic rhinitis due to pollen: Secondary | ICD-10-CM | POA: Diagnosis not present

## 2016-12-19 DIAGNOSIS — H838X2 Other specified diseases of left inner ear: Secondary | ICD-10-CM | POA: Diagnosis not present

## 2016-12-19 DIAGNOSIS — J309 Allergic rhinitis, unspecified: Secondary | ICD-10-CM

## 2016-12-19 DIAGNOSIS — M5134 Other intervertebral disc degeneration, thoracic region: Secondary | ICD-10-CM | POA: Diagnosis not present

## 2016-12-19 DIAGNOSIS — M9902 Segmental and somatic dysfunction of thoracic region: Secondary | ICD-10-CM | POA: Diagnosis not present

## 2016-12-19 HISTORY — DX: Allergic rhinitis, unspecified: J30.9

## 2016-12-20 DIAGNOSIS — M9902 Segmental and somatic dysfunction of thoracic region: Secondary | ICD-10-CM | POA: Diagnosis not present

## 2016-12-20 DIAGNOSIS — M5134 Other intervertebral disc degeneration, thoracic region: Secondary | ICD-10-CM | POA: Diagnosis not present

## 2016-12-24 ENCOUNTER — Other Ambulatory Visit: Payer: Self-pay | Admitting: Internal Medicine

## 2016-12-24 MED ORDER — PREDNISONE 50 MG PO TABS: ORAL_TABLET | ORAL | 0 refills | Status: DC

## 2016-12-24 MED ORDER — FEXOFENADINE HCL 60 MG PO TABS
60.0000 mg | ORAL_TABLET | Freq: Two times a day (BID) | ORAL | 1 refills | Status: DC
Start: 2016-12-24 — End: 2017-10-11

## 2016-12-24 MED ORDER — CLINDAMYCIN HCL 300 MG PO CAPS: 300.0000 mg | ORAL_CAPSULE | Freq: Three times a day (TID) | ORAL | 0 refills | Status: DC

## 2016-12-24 NOTE — Progress Notes (Signed)
Donna Davis has had off an on sinus infection symptoms X 2 months.  She has had a CT of her sinuses which did not show acute infection a few weeks ago, but reports swelling, tooth pain, drainage and pain of her sinuses.  Her CT scan did show enlarged turbinates and nasal septal deviation which could be predisposing her to an infection.   I have asked her to come in to clinic this week.   I will try a longer course of clindamycin (3 weeks) given her recurrent and progressive symptoms.   I have also asked her to see her ENT doctor for formal sinus evaluation.  She has recently had cochlear implants.   Gilles Chiquito, MD

## 2016-12-27 DIAGNOSIS — M5134 Other intervertebral disc degeneration, thoracic region: Secondary | ICD-10-CM | POA: Diagnosis not present

## 2016-12-27 DIAGNOSIS — M9902 Segmental and somatic dysfunction of thoracic region: Secondary | ICD-10-CM | POA: Diagnosis not present

## 2017-01-03 ENCOUNTER — Encounter: Payer: Self-pay | Admitting: Internal Medicine

## 2017-01-04 ENCOUNTER — Other Ambulatory Visit: Payer: Self-pay | Admitting: Internal Medicine

## 2017-01-04 ENCOUNTER — Other Ambulatory Visit: Payer: Self-pay | Admitting: *Deleted

## 2017-01-04 DIAGNOSIS — G43009 Migraine without aura, not intractable, without status migrainosus: Secondary | ICD-10-CM

## 2017-01-04 MED ORDER — CLINDAMYCIN HCL 300 MG PO CAPS: 300.0000 mg | ORAL_CAPSULE | Freq: Three times a day (TID) | ORAL | 0 refills | Status: DC

## 2017-01-04 MED ORDER — PREDNISONE 50 MG PO TABS: ORAL_TABLET | ORAL | 0 refills | Status: DC

## 2017-01-04 NOTE — Telephone Encounter (Signed)
Called to pharm 

## 2017-01-08 ENCOUNTER — Telehealth: Payer: Self-pay | Admitting: Internal Medicine

## 2017-01-08 NOTE — Telephone Encounter (Signed)
APT. REMINDER CALL, LMTCB °

## 2017-01-09 ENCOUNTER — Encounter: Payer: Self-pay | Admitting: Internal Medicine

## 2017-01-09 ENCOUNTER — Ambulatory Visit (INDEPENDENT_AMBULATORY_CARE_PROVIDER_SITE_OTHER): Payer: Medicare Other | Admitting: Internal Medicine

## 2017-01-09 VITALS — BP 119/59 | HR 78 | Temp 98.2°F | Ht 61.0 in | Wt 215.8 lb

## 2017-01-09 DIAGNOSIS — Z87891 Personal history of nicotine dependence: Secondary | ICD-10-CM | POA: Diagnosis not present

## 2017-01-09 DIAGNOSIS — J3089 Other allergic rhinitis: Secondary | ICD-10-CM

## 2017-01-09 DIAGNOSIS — J3489 Other specified disorders of nose and nasal sinuses: Secondary | ICD-10-CM | POA: Diagnosis not present

## 2017-01-09 MED ORDER — MONTELUKAST SODIUM 10 MG PO TABS: 10.0000 mg | ORAL_TABLET | Freq: Every day | ORAL | 3 refills | Status: DC

## 2017-01-09 MED ORDER — FLUTICASONE PROPIONATE 50 MCG/ACT NA SUSP: 2.0000 | Freq: Every day | NASAL | 2 refills | Status: DC

## 2017-01-09 NOTE — Assessment & Plan Note (Signed)
I am not sure exactly what is happening with Donna Davis's sinuses.  Most likely diagnosis is allergic rhinitis, however, her symptoms seem severe for this diagnosis.  She does not quite meet criteria for chronic bacterial sinusitis.  Possibly she has an undertreated acute bacterial sinusitis.  She is improved on Abx.    Plan 3 week trial of clindamcyin Add flonase daily Add singulair Continue allegra BID  After Abx, evaluation in clinic.  If no better consider consultation with local general ENT or allergy specialist.    F/U in 1 month.

## 2017-01-09 NOTE — Patient Instructions (Addendum)
Donna Davis - -  For your sinuses - - Please start the following medication  Montelukast (Singulair) 10 mg daily  Flonase 2 sprays in each nostril daily  Please come back to see me in 1 month, or 1 week after completing antibiotics to see how you are doing.   Please call me if any of the medications are too expensive.   Thank you!  Montelukast oral tablets What is this medicine? MONTELUKAST (mon te LOO kast) is used to prevent and treat the symptoms of asthma. It is also used to treat allergies. Do not use for an acute asthma attack. This medicine may be used for other purposes; ask your health care provider or pharmacist if you have questions. COMMON BRAND NAME(S): Singulair What should I tell my health care provider before I take this medicine? They need to know if you have any of these conditions: -liver disease -an unusual or allergic reaction to montelukast, other medicines, foods, dyes, or preservatives -pregnant or trying to get pregnant -breast-feeding How should I use this medicine? This medicine should be given by mouth. Follow the directions on the prescription label. Take this medicine at the same time every day. You may take this medicine with or without meals. Do not chew the tablets. Do not stop taking your medicine unless your doctor tells you to. Talk to your pediatrician regarding the use of this medicine in children. Special care may be needed. While this drug may be prescribed for children as young as 12 years of age for selected conditions, precautions do apply. Overdosage: If you think you have taken too much of this medicine contact a poison control center or emergency room at once. NOTE: This medicine is only for you. Do not share this medicine with others. What if I miss a dose? If you miss a dose, take it as soon as you can. If it is almost time for your next dose, take only that dose. Do not take double or extra doses. What may interact with this  medicine? -anti-infectives like rifampin and rifabutin -medicines for diabetes like rosiglitazone and repaglinide -medicines for seizures like phenytoin, phenobarbital, and carbamazepine -paclitaxel This list may not describe all possible interactions. Give your health care provider a list of all the medicines, herbs, non-prescription drugs, or dietary supplements you use. Also tell them if you smoke, drink alcohol, or use illegal drugs. Some items may interact with your medicine. What should I watch for while using this medicine? Visit your doctor or health care professional for regular checks on your progress. Tell your doctor or health care professional if your allergy or asthma symptoms do not improve. Take your medicine even when you do not have symptoms. Do not stop taking any of your medicine(s) unless your doctor tells you to. If you have asthma, talk to your doctor about what to do in an acute asthma attack. Always have your inhaled rescue medicine for asthma attacks with you. Patients and their families should watch for new or worsening thoughts of suicide or depression. Also watch for sudden changes in feelings such as feeling anxious, agitated, panicky, irritable, hostile, aggressive, impulsive, severely restless, overly excited and hyperactive, or not being able to sleep. Any worsening of mood or thoughts of suicide or dying should be reported to your health care professional right away. What side effects may I notice from receiving this medicine? Side effects that you should report to your doctor or health care professional as soon as possible: -allergic reactions like  skin rash or hives, or swelling of the face, lips, or tongue -breathing problems -confusion -dark urine -fever or infection -flu-like symptoms -hallucinations -painful lumps under the skin -pain, tingling, numbness in the hands or feet -sinus pain or swelling -suicidal thoughts or other mood  changes -tremors -trouble sleeping -uncontrolled muscle movements -unusual bleeding or bruising -yellowing of the eyes or skin Side effects that usually do not require medical attention (report to your doctor or health care professional if they continue or are bothersome): -cough -dizziness -drowsiness -headache -nightmares -stomach upset -stuffy nose This list may not describe all possible side effects. Call your doctor for medical advice about side effects. You may report side effects to FDA at 1-800-FDA-1088. Where should I keep my medicine? Keep out of the reach of children. Store at room temperature between 15 and 30 degrees C (59 and 86 degrees F). Protect from light and moisture. Keep this medicine in the original bottle. Throw away any unused medicine after the expiration date. NOTE: This sheet is a summary. It may not cover all possible information. If you have questions about this medicine, talk to your doctor, pharmacist, or health care provider.  2018 Elsevier/Gold Standard (2015-09-05 09:40:44)

## 2017-01-09 NOTE — Progress Notes (Signed)
   Subjective:    Patient ID: Donna Davis, female    DOB: Jul 30, 1971, 46 y.o.   MRN: [id number]  CC: Sinus pain and pressure  HPI  Donna Davis is a 46yo woman with PMH of hearing loss s/p CI bilaterally, migraine, hypothryoidism who presents for the acute issue of sinus pressure and pain.  She reports that she had an episode of sinusitis in middle March for which she was treated with steroids and antibiotics.  She felt that her symptoms improved, but then they recurred a few days after stopping Abx. CT scan of the sinuses at that time showed no sinus thickening and clear sinuses, but it did show prominent turbinates and nasal septal deviation.    About a week after stopping antibiotics she developed green drainage from her nose, post-nasal drip, right facial pressure and pain, right sided gum swelling and pain, submandibular LN on the left and right, sore throat.  She also noted a sore on her left upper arm which she felt was a LN which was swollen and tender.  She contacted the clinic and was restarted on prednisone for a 5 day course a prolonged course of clindamycin.  She now says that her symptoms are resolving.  She started Abx about 3 days ago.  She notes that her drainage has returned to a manageable level, her LN swelling has decreased and her facial pain has resolved.   She saw her ENT doctor when she was having acute symptoms, however, he is a subspecialist who only works with cochlear implants.  I reviewed the note from 12/19/16 and the CT scan was reviewed and it did show no active sinusitis, which does not completely fit with her symptoms.   Review of Systems  HENT: Positive for congestion, facial swelling, postnasal drip, rhinorrhea, sinus pain, sinus pressure and sore throat.   Eyes: Negative for photophobia and visual disturbance.  Respiratory: Negative for cough and shortness of breath.        Objective:   Physical Exam  Constitutional: She appears well-developed and well-nourished.  No distress.  HENT:  Nose: Mucosal edema and rhinorrhea present. No nose lacerations. Right sinus exhibits maxillary sinus tenderness. Left sinus exhibits no maxillary sinus tenderness.  Mouth/Throat: Mucous membranes are not pale and not cyanotic. No oral lesions. Normal dentition. No dental abscesses or uvula swelling. No oropharyngeal exudate or posterior oropharyngeal edema.  Eyes: Conjunctivae are normal. No scleral icterus.  Neck: Neck supple.  Pulmonary/Chest: Effort normal and breath sounds normal. No respiratory distress. She has no wheezes.  Lymphadenopathy:    She has no cervical adenopathy.        Assessment & Plan:  RTC in 1 month, 1 week after stopping Abx.

## 2017-01-14 ENCOUNTER — Encounter: Payer: Self-pay | Admitting: Internal Medicine

## 2017-01-14 DIAGNOSIS — M9902 Segmental and somatic dysfunction of thoracic region: Secondary | ICD-10-CM | POA: Diagnosis not present

## 2017-01-14 DIAGNOSIS — M5134 Other intervertebral disc degeneration, thoracic region: Secondary | ICD-10-CM | POA: Diagnosis not present

## 2017-01-15 ENCOUNTER — Encounter: Payer: Self-pay | Admitting: Internal Medicine

## 2017-01-16 ENCOUNTER — Other Ambulatory Visit: Payer: Self-pay | Admitting: Internal Medicine

## 2017-01-16 ENCOUNTER — Encounter: Payer: Self-pay | Admitting: Internal Medicine

## 2017-01-16 DIAGNOSIS — R197 Diarrhea, unspecified: Secondary | ICD-10-CM

## 2017-01-23 DIAGNOSIS — M5134 Other intervertebral disc degeneration, thoracic region: Secondary | ICD-10-CM | POA: Diagnosis not present

## 2017-01-23 DIAGNOSIS — M9902 Segmental and somatic dysfunction of thoracic region: Secondary | ICD-10-CM | POA: Diagnosis not present

## 2017-01-25 DIAGNOSIS — M5134 Other intervertebral disc degeneration, thoracic region: Secondary | ICD-10-CM | POA: Diagnosis not present

## 2017-01-25 DIAGNOSIS — M9902 Segmental and somatic dysfunction of thoracic region: Secondary | ICD-10-CM | POA: Diagnosis not present

## 2017-01-29 DIAGNOSIS — F411 Generalized anxiety disorder: Secondary | ICD-10-CM | POA: Diagnosis not present

## 2017-01-30 DIAGNOSIS — M5134 Other intervertebral disc degeneration, thoracic region: Secondary | ICD-10-CM | POA: Diagnosis not present

## 2017-01-30 DIAGNOSIS — M9902 Segmental and somatic dysfunction of thoracic region: Secondary | ICD-10-CM | POA: Diagnosis not present

## 2017-02-06 ENCOUNTER — Ambulatory Visit (INDEPENDENT_AMBULATORY_CARE_PROVIDER_SITE_OTHER): Payer: Medicare Other | Admitting: Internal Medicine

## 2017-02-06 ENCOUNTER — Encounter: Payer: Self-pay | Admitting: Internal Medicine

## 2017-02-06 VITALS — BP 122/67 | HR 90 | Temp 98.6°F | Ht 61.0 in | Wt 209.4 lb

## 2017-02-06 DIAGNOSIS — E038 Other specified hypothyroidism: Secondary | ICD-10-CM | POA: Diagnosis not present

## 2017-02-06 DIAGNOSIS — M5134 Other intervertebral disc degeneration, thoracic region: Secondary | ICD-10-CM | POA: Diagnosis not present

## 2017-02-06 DIAGNOSIS — E039 Hypothyroidism, unspecified: Secondary | ICD-10-CM

## 2017-02-06 DIAGNOSIS — F411 Generalized anxiety disorder: Secondary | ICD-10-CM

## 2017-02-06 DIAGNOSIS — E669 Obesity, unspecified: Secondary | ICD-10-CM

## 2017-02-06 DIAGNOSIS — Z87891 Personal history of nicotine dependence: Secondary | ICD-10-CM

## 2017-02-06 DIAGNOSIS — G43909 Migraine, unspecified, not intractable, without status migrainosus: Secondary | ICD-10-CM | POA: Diagnosis not present

## 2017-02-06 DIAGNOSIS — Z9621 Cochlear implant status: Secondary | ICD-10-CM

## 2017-02-06 DIAGNOSIS — J3089 Other allergic rhinitis: Secondary | ICD-10-CM | POA: Diagnosis not present

## 2017-02-06 DIAGNOSIS — H9192 Unspecified hearing loss, left ear: Secondary | ICD-10-CM

## 2017-02-06 DIAGNOSIS — K219 Gastro-esophageal reflux disease without esophagitis: Secondary | ICD-10-CM | POA: Diagnosis not present

## 2017-02-06 DIAGNOSIS — M9902 Segmental and somatic dysfunction of thoracic region: Secondary | ICD-10-CM | POA: Diagnosis not present

## 2017-02-06 DIAGNOSIS — G43009 Migraine without aura, not intractable, without status migrainosus: Secondary | ICD-10-CM

## 2017-02-06 MED ORDER — OMEPRAZOLE 40 MG PO CPDR: 40.0000 mg | DELAYED_RELEASE_CAPSULE | Freq: Every day | ORAL | 0 refills | Status: DC

## 2017-02-06 NOTE — Patient Instructions (Signed)
Ms. Even - -  I am glad to hear that your sinuses are improved!  Please keep taking your allergy medications and nasal spray to help improve this.   For your reflux and feeling like food is getting stuck, please take Omeprazole 40mg  daily for 3 months.  After this, try stopping the medication and taking over the counter famotidine as needed.  Come back to see me in 3 months as well to see how you are doing.    Please email me if you need anything in the interim.   Thank you!

## 2017-02-06 NOTE — Assessment & Plan Note (Signed)
She has an issue with her left CI and is due for surgery.  Continue follow up with ENT provider.

## 2017-02-06 NOTE — Assessment & Plan Note (Signed)
Symptoms are somewhat worse due to recent change in job requirements and decreased sleep.  She is hoping to decrease her hours at work soon and thinks that these will then improve.  We have tried a myriad of daily medications in the past for her without success.  Currently she does well with one tramadol at the time of headache and sleep.    Plan Continue tramadol for abortive therapy.  

## 2017-02-06 NOTE — Assessment & Plan Note (Signed)
She is taking her synthroid without issue.  No acute complaints.   Plan Check TSH today, last in 2016 was normal.  Continue synthroid.

## 2017-02-06 NOTE — Assessment & Plan Note (Signed)
Her sinus symptoms are much improved, appear to be improved by daily flonase and singulair with BID dosing of allegra.   Plan Continue flonase, allegra, singulair

## 2017-02-06 NOTE — Progress Notes (Signed)
   Subjective:    Patient ID: Donna Davis, female    DOB: 1971/04/15, 46 y.o.   MRN: [id number]  1 month follow up for sinusitis  HPI   Donna Davis is a 46yo woman with acquired deafness, cochlear implant, migraines, allergic rhinitis who presents for follow up of sinus issues.  She has had intermittent sinus pain, pressure, drainage, facial cellulitis, etc for the last many months.  I attempted a trial of treatment for possible chronic sinusitis and allergic rhinitis at last visit.   Today, Donna Davis reports that her sinus issues are much better. She started taking the flonase and singulair with good results.  She only took about 1 week of the clindamycin b/c she developed diarrhea and stopped it.  The diarrhea ceased on it's own and she is no longer having this issue.   She has a new issue of feeling increased sensation of belching and pain with eating.  She also felt a pill got stuck.  She notes that this is worse with bubbly beverages and milk and better with cold drinks.  She has had GERD for a long while, but has recently only been taking medications intermittently.  She thinks it may be worse due to being off of her PPI.    She has had worsened migraines recently due to working extra hours and her sleep suffering.  The tramadol continues to work well and she has plenty of the medication.    She is working to lose weight by dieting and exercising.  She has lost about 4 pounds and I congratulated her on this accomplishment.   Review of Systems  Constitutional: Negative for activity change and fatigue.  HENT: Positive for congestion (mild, much improved) and hearing loss (chronic). Negative for facial swelling, sinus pressure, sneezing and sore throat.   Gastrointestinal: Negative for abdominal distention, abdominal pain, diarrhea, nausea and vomiting.       Reflux       Objective:   Physical Exam  Constitutional: She is oriented to person, place, and time. She appears well-developed and  well-nourished. No distress.  HENT:  Mouth/Throat: Uvula is midline. Mucous membranes are not pale and not cyanotic. No oral lesions. No uvula swelling or lacerations. Posterior oropharyngeal erythema (some cobblestoning) present. No oropharyngeal exudate or posterior oropharyngeal edema.  Cardiovascular: Normal rate, regular rhythm and normal heart sounds.   Pulmonary/Chest: Effort normal and breath sounds normal. No respiratory distress. She has no wheezes.  Neurological: She is alert and oriented to person, place, and time.  Psychiatric: She has a normal mood and affect. Her behavior is normal.   TSH today.        Assessment & Plan:  RTC in 3 months.

## 2017-02-06 NOTE — Assessment & Plan Note (Signed)
She follows with a mental health provider and is on buspar and xanax.   Plan Continue follow up with mental health provider.

## 2017-02-06 NOTE — Assessment & Plan Note (Signed)
By her description, her current GI symptoms could be consistent with undertreated GERD with the globus sensation and pain in the chest with swallowing (pain to the epigastrium).  She has been out of omeprazole for a while.  She notices that specific foods such as carbonated beverages are worse for her.   Plan Trial of omeprazole for 3 months, she will contact us if symptoms worsen or change.

## 2017-02-06 NOTE — Assessment & Plan Note (Signed)
She is very interested in weight loss and detailed to me her plans to cook healthier food at home and exercise by walking.  I encouraged her endeavors.

## 2017-02-06 NOTE — Assessment & Plan Note (Signed)
>>  ASSESSMENT AND PLAN FOR OBESITY (BMI 30-39.9) WRITTEN ON 02/06/2017  2:18 PM BY MULLEN, EMILY B, MD  She is very interested in weight loss and detailed to me her plans to cook healthier food at home and exercise by walking.  I encouraged her endeavors.

## 2017-02-07 LAB — TSH: TSH: 2.22 u[IU]/mL (ref 0.450–4.500)

## 2017-02-08 DIAGNOSIS — M9902 Segmental and somatic dysfunction of thoracic region: Secondary | ICD-10-CM | POA: Diagnosis not present

## 2017-02-08 DIAGNOSIS — M5134 Other intervertebral disc degeneration, thoracic region: Secondary | ICD-10-CM | POA: Diagnosis not present

## 2017-02-12 ENCOUNTER — Encounter: Payer: Self-pay | Admitting: Internal Medicine

## 2017-02-13 DIAGNOSIS — M5134 Other intervertebral disc degeneration, thoracic region: Secondary | ICD-10-CM | POA: Diagnosis not present

## 2017-02-13 DIAGNOSIS — M9902 Segmental and somatic dysfunction of thoracic region: Secondary | ICD-10-CM | POA: Diagnosis not present

## 2017-02-15 DIAGNOSIS — M9902 Segmental and somatic dysfunction of thoracic region: Secondary | ICD-10-CM | POA: Diagnosis not present

## 2017-02-15 DIAGNOSIS — M5134 Other intervertebral disc degeneration, thoracic region: Secondary | ICD-10-CM | POA: Diagnosis not present

## 2017-03-13 DIAGNOSIS — M5134 Other intervertebral disc degeneration, thoracic region: Secondary | ICD-10-CM | POA: Diagnosis not present

## 2017-03-13 DIAGNOSIS — M9902 Segmental and somatic dysfunction of thoracic region: Secondary | ICD-10-CM | POA: Diagnosis not present

## 2017-04-04 ENCOUNTER — Ambulatory Visit (INDEPENDENT_AMBULATORY_CARE_PROVIDER_SITE_OTHER): Payer: Medicare Other | Admitting: Internal Medicine

## 2017-04-04 ENCOUNTER — Other Ambulatory Visit: Payer: Self-pay | Admitting: Internal Medicine

## 2017-04-04 ENCOUNTER — Other Ambulatory Visit: Payer: Self-pay | Admitting: Obstetrics & Gynecology

## 2017-04-04 VITALS — BP 148/72 | HR 90 | Temp 97.9°F | Ht 61.0 in | Wt 208.5 lb

## 2017-04-04 DIAGNOSIS — Z87891 Personal history of nicotine dependence: Secondary | ICD-10-CM

## 2017-04-04 DIAGNOSIS — F419 Anxiety disorder, unspecified: Secondary | ICD-10-CM

## 2017-04-04 DIAGNOSIS — K219 Gastro-esophageal reflux disease without esophagitis: Secondary | ICD-10-CM | POA: Diagnosis not present

## 2017-04-04 DIAGNOSIS — E039 Hypothyroidism, unspecified: Secondary | ICD-10-CM

## 2017-04-04 DIAGNOSIS — R21 Rash and other nonspecific skin eruption: Secondary | ICD-10-CM | POA: Insufficient documentation

## 2017-04-04 DIAGNOSIS — H9190 Unspecified hearing loss, unspecified ear: Secondary | ICD-10-CM | POA: Diagnosis not present

## 2017-04-04 DIAGNOSIS — N951 Menopausal and female climacteric states: Secondary | ICD-10-CM

## 2017-04-04 MED ORDER — PERMETHRIN 5 % EX CREA: 1.0000 "application " | TOPICAL_CREAM | Freq: Once | CUTANEOUS | 1 refills | Status: AC

## 2017-04-04 NOTE — Assessment & Plan Note (Signed)
The patient has tried many different things to get relief from this widespread and progressively worsening rash and associated itching. She has limited allergen exposure, tried OTC creams/lotions including hydrocortisone, cleaned bed linens, and taken OTC anti-histamines. None of these have given her relief. The fact that she has such extreme widespread itching that wakes her up at night, along with recent (and new) exposure to hospitals and nursing homes, is concerning for scabies. Factors suggesting against this diagnosis include the fact that the multiple family members in her household do not have the same symptoms and she does not have the classically described linear lesions commonly observed on PE in scabies. Nevertheless, her symptoms are severe enough and the suspicion is high enough that she will be given topical permethrin 5%, the preferred treatment for scabies. She was instructed on how to use this medication correctly and that it should help her symptoms in the next few days. The patient was told to return to clinic if she does not improve at all with this treatment. If symptoms do not improve, I would consider prescribing a potent topical steroid cream and/or systemic steroids to help with her itching. A dermatology consult may also be useful.   A CBC with diff will be obtained to look for eosinophilia and CMP will be obtained to look for changes in hepatic function which may be causing systemic itching. The patient was told to leave clinic today by the provider without getting the blood work obtained, so the provider has made plans to contact the patient via email to have her come back for this blood work at her earliest convenience (possibly next week so that her symptoms can be reassessed).

## 2017-04-04 NOTE — Patient Instructions (Addendum)
Thank you for seeing [place] today.  We prescribed you some cream to use. You will use this cream once. Apply thoroughly from head to soles of feet. Leave on overnight (8-14 hours) then you can wash cream off in shower. You should only need one treatment, but I prescribed a refill in case symptoms don't resolve in 1 week.   If symptoms worsen or fail to improve after 1 week please call the clinic and reschedule an appointment for reevaluation.

## 2017-04-04 NOTE — Progress Notes (Signed)
CC: Itchy rash on both upper and lower extremities  HPI:  [name] is a 46 y.o. with a PMH of deafness, anxiety, GERD, and hypothyroidism who presents today for evaluation of rash. The rash started about 5 weeks ago and started on her the dorsal aspect of her left forearm. 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 She states that the rash started as a small, discrete, almost blister-like lesion on the lower aspect of her arm. The "blister" then popped and oozed a clear liquid. She then noticed the itching and red rash spreading up her left arm. Over the past few weeks the rash has spread to her right forearm and down her legs. The itching has gotten progressively worse as the rash has spread. It is so bad that it wakes her up at night. It has gotten to the point where she states that she feels itchy all over and she cannot concentrate on school work or other activities of daily living.   She states that she initially attributed this rash to allergies and has tried switching back to an old laundry detergent recently. She has switched to a mild soap and discontinued use of any body creams. She has not eaten any new foods recently and has not traveled out of state. She states that no one else in her house has a rash. She does not know of any recent tick bites or other insect bites. She denies taking any new prescription or OTC medications. No new herbal supplements.   She has tried to use daily Benadryl, clean her skin with alcohol, used calamine lotion, aloe, cortisol cream, body cream, and Aquafor to help relieve itching. None of these interventions have helped with her rash or itching. She was worried about bed bugs given that she was in the hospital and nursing home when the itching started, so she  cleaned all the bedding in her house to makes sure that nothing would spread to the other people in the house.   Past Medical History:  Diagnosis Date  . Anxiety   . Family history of malignant neoplasm of gastrointestinal tract   . GERD (gastroesophageal reflux disease)   . Hearing difficulty    b/l, 2/2 congenital rubella s/p stapedectomy. Using hearing aid  . Hepatic cyst    6 mm on CT done done in 05/2005  . Hypothyroidism   . IBS (irritable bowel syndrome)   . Migraines    migraine  . Shingles 03/2013   Review of Systems:   Patient endorses intermittent chills for the past 3 days. Patient denies fevers, headache, abdominal pain, shortness of breath, change in bowel movements, unintentional weight loss.   Physical Exam:  Vitals:   04/04/17 1354  BP: (!) 148/72  Pulse: 90  Temp: 97.9 F (36.6 C)  TempSrc: Oral  SpO2: 100%  Weight: 208 lb 8 oz (94.6 kg)  Height: 5\' 1"  (1.549 m)   Physical Exam  Constitutional: She appears well-developed and well-nourished. No distress.  Cardiovascular: Normal rate, regular rhythm, normal heart sounds and intact distal pulses.   No murmur heard. Pulmonary/Chest: Effort normal and breath sounds normal. No respiratory distress. She has no wheezes.  Abdominal: Soft. She exhibits no distension. There is no tenderness.  Musculoskeletal: She exhibits no edema or tenderness.  Skin:  The dorsal aspect of the patient's R and L forearm are erythematous and have small, red scattered papules (in various stages of healing) throughout. Dry skin flakes noted on knees and lower extremity, with areas of erythema and small, red papules throughout dorsal aspect of feet and shins bilaterally. There are no lesions in between digits on hands or feet.     Assessment & Plan:   See Encounters Tab for problem based charting.  Patient seen with Dr. Lynnae January.

## 2017-04-08 ENCOUNTER — Encounter: Payer: Self-pay | Admitting: Internal Medicine

## 2017-04-08 ENCOUNTER — Other Ambulatory Visit (INDEPENDENT_AMBULATORY_CARE_PROVIDER_SITE_OTHER): Payer: Medicare Other

## 2017-04-08 ENCOUNTER — Other Ambulatory Visit: Payer: Self-pay | Admitting: Internal Medicine

## 2017-04-08 DIAGNOSIS — R21 Rash and other nonspecific skin eruption: Secondary | ICD-10-CM | POA: Diagnosis not present

## 2017-04-08 MED ORDER — PREDNISONE 10 MG PO TABS: ORAL_TABLET | ORAL | 0 refills | Status: DC

## 2017-04-08 NOTE — Progress Notes (Signed)
Internal Medicine Clinic Attending  I saw and evaluated the patient.  I personally confirmed the key portions of the history and exam documented by Dr. Nedrud and I reviewed pertinent patient test results.  The assessment, diagnosis, and plan were formulated together and I agree with the documentation in the resident's note.  

## 2017-04-09 LAB — CBC WITH DIFFERENTIAL/PLATELET
Basophils Absolute: 0 10*3/uL (ref 0.0–0.2)
Basos: 0 %
EOS (ABSOLUTE): 0.2 10*3/uL (ref 0.0–0.4)
Eos: 2 %
HEMATOCRIT: 39.1 % (ref 34.0–46.6)
HEMOGLOBIN: 13 g/dL (ref 11.1–15.9)
IMMATURE GRANULOCYTES: 0 %
Immature Grans (Abs): 0 10*3/uL (ref 0.0–0.1)
Lymphocytes Absolute: 2.4 10*3/uL (ref 0.7–3.1)
Lymphs: 35 %
MCH: 30.2 pg (ref 26.6–33.0)
MCHC: 33.2 g/dL (ref 31.5–35.7)
MCV: 91 fL (ref 79–97)
MONOCYTES: 7 %
Monocytes Absolute: 0.5 10*3/uL (ref 0.1–0.9)
NEUTROS PCT: 56 %
Neutrophils Absolute: 3.8 10*3/uL (ref 1.4–7.0)
Platelets: 274 10*3/uL (ref 150–379)
RBC: 4.31 x10E6/uL (ref 3.77–5.28)
RDW: 12.2 % — AB (ref 12.3–15.4)
WBC: 6.9 10*3/uL (ref 3.4–10.8)

## 2017-04-09 LAB — CMP14 + ANION GAP
ALK PHOS: 67 IU/L (ref 39–117)
ALT: 14 IU/L (ref 0–32)
ANION GAP: 15 mmol/L (ref 10.0–18.0)
AST: 17 IU/L (ref 0–40)
Albumin/Globulin Ratio: 1.5 (ref 1.2–2.2)
Albumin: 3.9 g/dL (ref 3.5–5.5)
BUN/Creatinine Ratio: 13 (ref 9–23)
BUN: 8 mg/dL (ref 6–24)
Bilirubin Total: 0.2 mg/dL (ref 0.0–1.2)
CO2: 23 mmol/L (ref 20–29)
Calcium: 9.3 mg/dL (ref 8.7–10.2)
Chloride: 104 mmol/L (ref 96–106)
Creatinine, Ser: 0.61 mg/dL (ref 0.57–1.00)
GFR calc Af Amer: 124 mL/min/{1.73_m2} (ref >59)
GFR calc non Af Amer: 108 mL/min/{1.73_m2} (ref >59)
Globulin, Total: 2.6 g/dL (ref 1.5–4.5)
Glucose: 93 mg/dL (ref 65–99)
Potassium: 4.6 mmol/L (ref 3.5–5.2)
Sodium: 142 mmol/L (ref 134–144)
Total Protein: 6.5 g/dL (ref 6.0–8.5)

## 2017-04-14 ENCOUNTER — Encounter: Payer: Self-pay | Admitting: Internal Medicine

## 2017-04-14 DIAGNOSIS — L299 Pruritus, unspecified: Secondary | ICD-10-CM

## 2017-04-15 ENCOUNTER — Other Ambulatory Visit: Payer: Self-pay | Admitting: Internal Medicine

## 2017-04-15 ENCOUNTER — Encounter: Payer: Self-pay | Admitting: Internal Medicine

## 2017-04-15 DIAGNOSIS — M5031 Other cervical disc degeneration,  high cervical region: Secondary | ICD-10-CM | POA: Diagnosis not present

## 2017-04-15 DIAGNOSIS — M9901 Segmental and somatic dysfunction of cervical region: Secondary | ICD-10-CM | POA: Diagnosis not present

## 2017-04-16 ENCOUNTER — Other Ambulatory Visit: Payer: Self-pay | Admitting: Internal Medicine

## 2017-04-16 ENCOUNTER — Other Ambulatory Visit: Payer: Self-pay | Admitting: *Deleted

## 2017-04-16 DIAGNOSIS — G43009 Migraine without aura, not intractable, without status migrainosus: Secondary | ICD-10-CM

## 2017-04-16 MED ORDER — TRAMADOL HCL 50 MG PO TABS: 50.0000 mg | ORAL_TABLET | Freq: Four times a day (QID) | ORAL | 3 refills | Status: DC | PRN

## 2017-04-16 NOTE — Telephone Encounter (Signed)
rx already phoned in earlier today by nurse.Despina Hidden Cassady7/31/201812:42 PM

## 2017-04-16 NOTE — Telephone Encounter (Signed)
Xanax rx called to [facility].

## 2017-04-16 NOTE — Telephone Encounter (Signed)
Called to pharmacy 

## 2017-04-23 DIAGNOSIS — L309 Dermatitis, unspecified: Secondary | ICD-10-CM | POA: Diagnosis not present

## 2017-04-30 DIAGNOSIS — H903 Sensorineural hearing loss, bilateral: Secondary | ICD-10-CM | POA: Diagnosis not present

## 2017-04-30 DIAGNOSIS — M9905 Segmental and somatic dysfunction of pelvic region: Secondary | ICD-10-CM | POA: Diagnosis not present

## 2017-04-30 DIAGNOSIS — M9904 Segmental and somatic dysfunction of sacral region: Secondary | ICD-10-CM | POA: Diagnosis not present

## 2017-04-30 DIAGNOSIS — M9903 Segmental and somatic dysfunction of lumbar region: Secondary | ICD-10-CM | POA: Diagnosis not present

## 2017-04-30 DIAGNOSIS — M5136 Other intervertebral disc degeneration, lumbar region: Secondary | ICD-10-CM | POA: Diagnosis not present

## 2017-05-02 DIAGNOSIS — M5136 Other intervertebral disc degeneration, lumbar region: Secondary | ICD-10-CM | POA: Diagnosis not present

## 2017-05-02 DIAGNOSIS — M9904 Segmental and somatic dysfunction of sacral region: Secondary | ICD-10-CM | POA: Diagnosis not present

## 2017-05-02 DIAGNOSIS — M9905 Segmental and somatic dysfunction of pelvic region: Secondary | ICD-10-CM | POA: Diagnosis not present

## 2017-05-02 DIAGNOSIS — M9903 Segmental and somatic dysfunction of lumbar region: Secondary | ICD-10-CM | POA: Diagnosis not present

## 2017-05-07 DIAGNOSIS — F411 Generalized anxiety disorder: Secondary | ICD-10-CM | POA: Diagnosis not present

## 2017-05-14 DIAGNOSIS — M5136 Other intervertebral disc degeneration, lumbar region: Secondary | ICD-10-CM | POA: Diagnosis not present

## 2017-05-14 DIAGNOSIS — M9903 Segmental and somatic dysfunction of lumbar region: Secondary | ICD-10-CM | POA: Diagnosis not present

## 2017-05-14 DIAGNOSIS — M9905 Segmental and somatic dysfunction of pelvic region: Secondary | ICD-10-CM | POA: Diagnosis not present

## 2017-05-14 DIAGNOSIS — M9904 Segmental and somatic dysfunction of sacral region: Secondary | ICD-10-CM | POA: Diagnosis not present

## 2017-05-17 DIAGNOSIS — M5136 Other intervertebral disc degeneration, lumbar region: Secondary | ICD-10-CM | POA: Diagnosis not present

## 2017-05-17 DIAGNOSIS — M9904 Segmental and somatic dysfunction of sacral region: Secondary | ICD-10-CM | POA: Diagnosis not present

## 2017-05-17 DIAGNOSIS — M9905 Segmental and somatic dysfunction of pelvic region: Secondary | ICD-10-CM | POA: Diagnosis not present

## 2017-05-17 DIAGNOSIS — M9903 Segmental and somatic dysfunction of lumbar region: Secondary | ICD-10-CM | POA: Diagnosis not present

## 2017-05-20 IMAGING — DX DG FINGER INDEX 2+V*R*
3 series · 3 of 3 positions shown · non-contrast
Comparison: None.

CLINICAL DATA: Status post PIP joint dislocation of the second
digit

EXAM:
RIGHT INDEX FINGER 2+V

[finger ap]
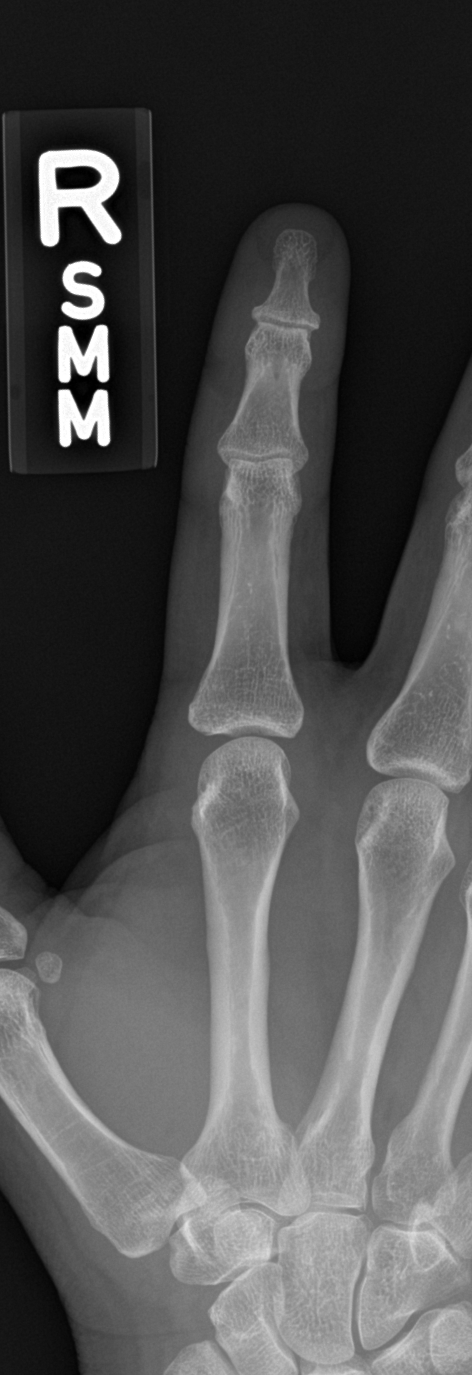

[finger obl]
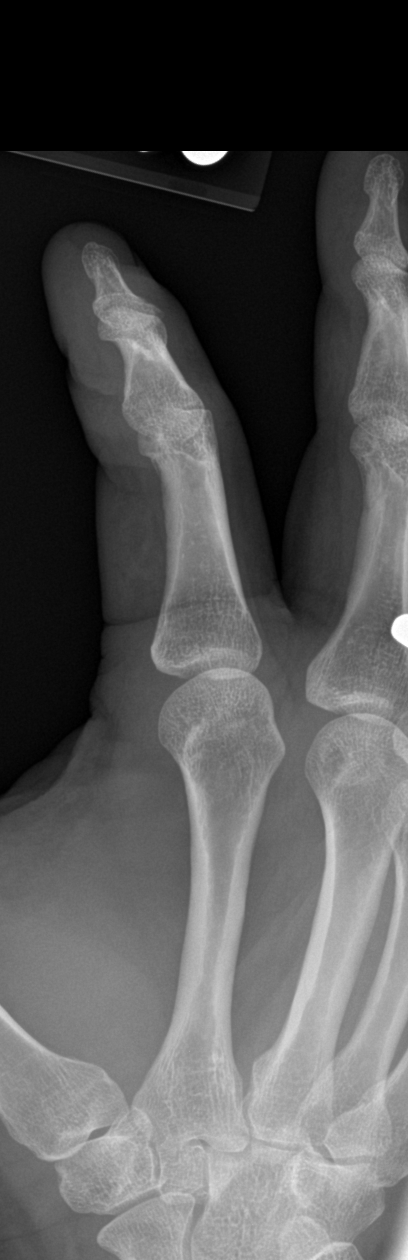

[finger lat]
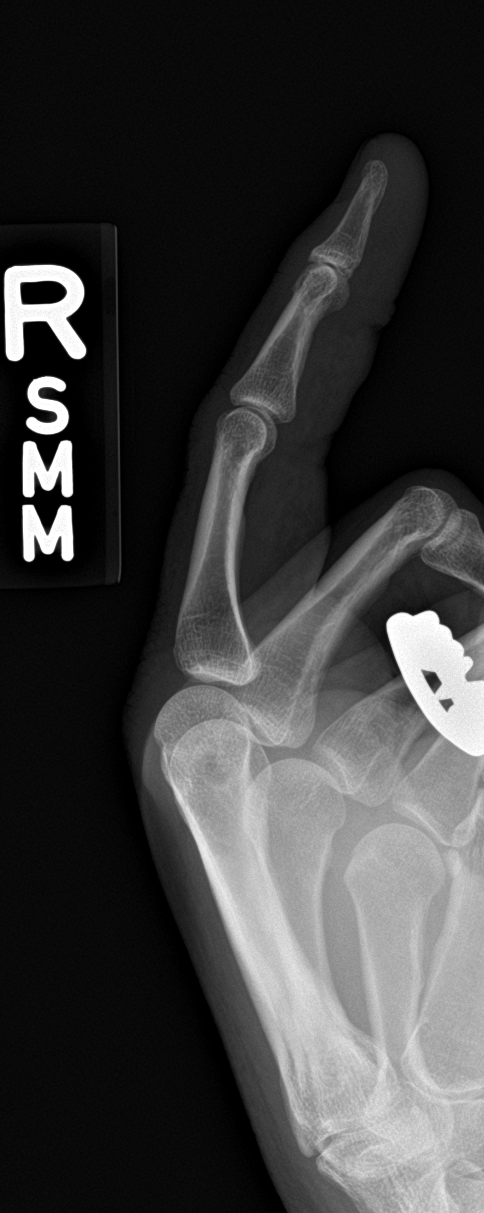

[3 of 3 positions shown; findings below may reference images not displayed]

FINDINGS: There is no evidence of fracture or dislocation. There is mild
osteoarthritis of the second DIP joint. Soft tissues are
unremarkable.
IMPRESSION: No acute osseous injury of the right second digit..

## 2017-05-23 DIAGNOSIS — M9904 Segmental and somatic dysfunction of sacral region: Secondary | ICD-10-CM | POA: Diagnosis not present

## 2017-05-23 DIAGNOSIS — M5136 Other intervertebral disc degeneration, lumbar region: Secondary | ICD-10-CM | POA: Diagnosis not present

## 2017-05-23 DIAGNOSIS — M9903 Segmental and somatic dysfunction of lumbar region: Secondary | ICD-10-CM | POA: Diagnosis not present

## 2017-05-23 DIAGNOSIS — M9905 Segmental and somatic dysfunction of pelvic region: Secondary | ICD-10-CM | POA: Diagnosis not present

## 2017-05-29 DIAGNOSIS — M9905 Segmental and somatic dysfunction of pelvic region: Secondary | ICD-10-CM | POA: Diagnosis not present

## 2017-05-29 DIAGNOSIS — M9904 Segmental and somatic dysfunction of sacral region: Secondary | ICD-10-CM | POA: Diagnosis not present

## 2017-05-29 DIAGNOSIS — M5136 Other intervertebral disc degeneration, lumbar region: Secondary | ICD-10-CM | POA: Diagnosis not present

## 2017-05-29 DIAGNOSIS — M9903 Segmental and somatic dysfunction of lumbar region: Secondary | ICD-10-CM | POA: Diagnosis not present

## 2017-06-06 DIAGNOSIS — M5136 Other intervertebral disc degeneration, lumbar region: Secondary | ICD-10-CM | POA: Diagnosis not present

## 2017-06-06 DIAGNOSIS — M9904 Segmental and somatic dysfunction of sacral region: Secondary | ICD-10-CM | POA: Diagnosis not present

## 2017-06-06 DIAGNOSIS — M9903 Segmental and somatic dysfunction of lumbar region: Secondary | ICD-10-CM | POA: Diagnosis not present

## 2017-06-06 DIAGNOSIS — M9905 Segmental and somatic dysfunction of pelvic region: Secondary | ICD-10-CM | POA: Diagnosis not present

## 2017-06-11 DIAGNOSIS — M9901 Segmental and somatic dysfunction of cervical region: Secondary | ICD-10-CM | POA: Diagnosis not present

## 2017-06-11 DIAGNOSIS — M50322 Other cervical disc degeneration at C5-C6 level: Secondary | ICD-10-CM | POA: Diagnosis not present

## 2017-06-13 ENCOUNTER — Encounter: Payer: Self-pay | Admitting: Internal Medicine

## 2017-06-13 ENCOUNTER — Ambulatory Visit (INDEPENDENT_AMBULATORY_CARE_PROVIDER_SITE_OTHER): Payer: Medicare Other | Admitting: Internal Medicine

## 2017-06-13 VITALS — BP 135/80 | HR 81 | Temp 98.3°F | Wt 203.1 lb

## 2017-06-13 DIAGNOSIS — R5383 Other fatigue: Secondary | ICD-10-CM | POA: Diagnosis not present

## 2017-06-13 DIAGNOSIS — G43909 Migraine, unspecified, not intractable, without status migrainosus: Secondary | ICD-10-CM

## 2017-06-13 DIAGNOSIS — M791 Myalgia: Secondary | ICD-10-CM

## 2017-06-13 DIAGNOSIS — M5136 Other intervertebral disc degeneration, lumbar region: Secondary | ICD-10-CM | POA: Diagnosis not present

## 2017-06-13 DIAGNOSIS — R5381 Other malaise: Secondary | ICD-10-CM

## 2017-06-13 DIAGNOSIS — Z9621 Cochlear implant status: Secondary | ICD-10-CM | POA: Diagnosis not present

## 2017-06-13 DIAGNOSIS — R110 Nausea: Secondary | ICD-10-CM

## 2017-06-13 DIAGNOSIS — Z79891 Long term (current) use of opiate analgesic: Secondary | ICD-10-CM | POA: Diagnosis not present

## 2017-06-13 DIAGNOSIS — H9201 Otalgia, right ear: Secondary | ICD-10-CM

## 2017-06-13 DIAGNOSIS — M9905 Segmental and somatic dysfunction of pelvic region: Secondary | ICD-10-CM | POA: Diagnosis not present

## 2017-06-13 DIAGNOSIS — M9904 Segmental and somatic dysfunction of sacral region: Secondary | ICD-10-CM | POA: Diagnosis not present

## 2017-06-13 DIAGNOSIS — Z87891 Personal history of nicotine dependence: Secondary | ICD-10-CM | POA: Diagnosis not present

## 2017-06-13 DIAGNOSIS — M9903 Segmental and somatic dysfunction of lumbar region: Secondary | ICD-10-CM | POA: Diagnosis not present

## 2017-06-13 DIAGNOSIS — R6883 Chills (without fever): Secondary | ICD-10-CM | POA: Diagnosis not present

## 2017-06-13 MED ORDER — AMOXICILLIN-POT CLAVULANATE 875-125 MG PO TABS: 1.0000 | ORAL_TABLET | Freq: Two times a day (BID) | ORAL | 0 refills | Status: DC

## 2017-06-13 MED ORDER — AMOXICILLIN-POT CLAVULANATE 875-125 MG PO TABS: 1.0000 | ORAL_TABLET | Freq: Two times a day (BID) | ORAL | 0 refills | Status: AC

## 2017-06-13 NOTE — Progress Notes (Signed)
   CC: ear pain   HPI:  [name] is a 46 y.o. female with past medical history as documented below presenting with a chief complaint of right ear pain. She states the pain started six days ago when she noticed she was having ear pain with chewing. At that time she described the pain as stabbing, like an "ice pick in her ear." Her pain has now now progressed to a constant throbbing, pressure-like pain. She states her ear felt wet last night, but has not noticed any purulent drainage. She endorses subjective fevers, nausea, and body aches, but denies other upper respiratory symptoms. She also states she has had migraine headaches and right sided facial pain for the past few days, requiring her to take more tramadol than usual. The tramadol has not helped the pain. The patient does have a history of right cochlear implant, which was surgically implanted over a year ago. She has had no complications. She follows with [facility]. Her last ear infection was 1-2 years ago, but the patient cannot remember.   Past Medical History:  Diagnosis Date  . Anxiety   . Family history of malignant neoplasm of gastrointestinal tract   . GERD (gastroesophageal reflux disease)   . Hearing difficulty    b/l, 2/2 congenital rubella s/p stapedectomy. Using hearing aid  . Hepatic cyst    6 mm on CT done done in 05/2005  . Hypothyroidism   . IBS (irritable bowel syndrome)   . Migraines    migraine  . Shingles 03/2013   Review of Systems:   Review of Systems  Constitutional: Positive for chills, fever (subjective) and malaise/fatigue.  HENT: Positive for ear pain. Negative for congestion, ear discharge, sinus pain and sore throat.   Respiratory: Negative for cough and shortness of breath.   Cardiovascular: Negative for chest pain.  Gastrointestinal: Positive for nausea. Negative for vomiting.  Musculoskeletal: Positive for myalgias.  Neurological: Positive for headaches.    Physical  Exam:  Vitals:   06/13/17 1355  BP: 135/80  Pulse: 81  Temp: 98.3 F (36.8 C)  TempSrc: Oral  SpO2: 98%  Weight: 203 lb 1.6 oz (92.1 kg)   Physical Exam  Constitutional: She is oriented to person, place, and time. She appears well-developed and well-nourished. No distress.  HENT:  Right Ear: External ear and ear canal normal. There is tenderness. No drainage. No mastoid tenderness. Tympanic membrane is injected. Tympanic membrane is not bulging.  Left Ear: Tympanic membrane, external ear and ear canal normal.  Right ear: No erythema or warmth noted on surrounding skin. Pain elicited with movement of the pinna. Erythema noted on mucosa around TM, but cone reflex was intact, with no evidence of TM bulging or fluid. No purulent drainage.   Neck: Full passive range of motion without pain. Neck supple.  Cardiovascular: Normal rate and regular rhythm.   Pulmonary/Chest: Effort normal and breath sounds normal.  Lymphadenopathy:    She has no cervical adenopathy.  Neurological: She is alert and oriented to person, place, and time. She has normal strength. No cranial nerve deficit.    Assessment & Plan:   See Encounters Tab for problem based charting.  Patient seen with Dr. Rebeca Alert

## 2017-06-13 NOTE — Patient Instructions (Signed)
Donna Davis,   I have prescribed you Augmentin. Please take this twice a day for one week. If you have any adverse reactions call the office or return to clinic.   I will try and get in contact with [facility].   I hope you feel better!

## 2017-06-13 NOTE — Assessment & Plan Note (Addendum)
The patient symptoms and physical exam findings are consistent with acute otitis media. She is afebrile, with stable vital signs, and no focal neurological deficits. She does have a right cochlear implant, which was implanted over 1 year ago without complication. Tried getting in touch with patient's ENT specialist at [facility], [place]. I was able to talk to a Triage nurse, but never heard back from the patient's physician.   Plan: -Augmentin 875 mg BID for 7 days; patient has an allergy against Augmentin listed but after talking to the patient she states it causes her GI upset and she is willing to try it and will call the clinic if she has an adverse reaction -Strict return precautions were discussed with the patient (fever, vomiting, neck rigidity, confusion, worsening pain)

## 2017-06-14 NOTE — Progress Notes (Signed)
Internal Medicine Clinic Attending  I saw and evaluated the patient.  I personally confirmed the key portions of the history and exam documented by Dr. Aggie Hacker and I reviewed pertinent patient test results.  The assessment, diagnosis, and plan were formulated together and I agree with the documentation in the resident's note.  History suggestive of AOM, on exam TM has some peripheral erythema and cloudiness, but no visible effusion or bulging. She also has some pain with movement of the pinna, but no mastoid tenderness and normal canal. Unfortunately, we were unable to reach her ENT, but will treat for AOM given the presence of cochlear implant.  Oda Kilts, MD

## 2017-06-24 DIAGNOSIS — M5030 Other cervical disc degeneration, unspecified cervical region: Secondary | ICD-10-CM | POA: Diagnosis not present

## 2017-06-24 DIAGNOSIS — M9901 Segmental and somatic dysfunction of cervical region: Secondary | ICD-10-CM | POA: Diagnosis not present

## 2017-06-26 DIAGNOSIS — M5030 Other cervical disc degeneration, unspecified cervical region: Secondary | ICD-10-CM | POA: Diagnosis not present

## 2017-06-26 DIAGNOSIS — M9901 Segmental and somatic dysfunction of cervical region: Secondary | ICD-10-CM | POA: Diagnosis not present

## 2017-07-09 DIAGNOSIS — M5030 Other cervical disc degeneration, unspecified cervical region: Secondary | ICD-10-CM | POA: Diagnosis not present

## 2017-07-09 DIAGNOSIS — M9901 Segmental and somatic dysfunction of cervical region: Secondary | ICD-10-CM | POA: Diagnosis not present

## 2017-07-12 DIAGNOSIS — M9901 Segmental and somatic dysfunction of cervical region: Secondary | ICD-10-CM | POA: Diagnosis not present

## 2017-07-12 DIAGNOSIS — M5030 Other cervical disc degeneration, unspecified cervical region: Secondary | ICD-10-CM | POA: Diagnosis not present

## 2017-07-23 ENCOUNTER — Observation Stay (HOSPITAL_COMMUNITY)
Admission: AD | Admit: 2017-07-23 | Discharge: 2017-07-24 | Disposition: A | Discharge status: Home / Self Care | Payer: Medicare Other | Source: Ambulatory Visit | Attending: Internal Medicine | Admitting: Internal Medicine

## 2017-07-23 ENCOUNTER — Observation Stay (HOSPITAL_COMMUNITY): Payer: Medicare Other

## 2017-07-23 ENCOUNTER — Other Ambulatory Visit: Payer: Self-pay

## 2017-07-23 ENCOUNTER — Encounter (HOSPITAL_COMMUNITY): Payer: Self-pay | Admitting: General Practice

## 2017-07-23 ENCOUNTER — Encounter: Payer: Self-pay | Admitting: Internal Medicine

## 2017-07-23 ENCOUNTER — Observation Stay (HOSPITAL_COMMUNITY)
Admission: RE | Admit: 2017-07-23 | Discharge: 2017-07-23 | Disposition: A | Discharge status: Home / Self Care | Payer: Medicare Other | Source: Ambulatory Visit | Attending: Internal Medicine | Admitting: Internal Medicine

## 2017-07-23 ENCOUNTER — Ambulatory Visit (INDEPENDENT_AMBULATORY_CARE_PROVIDER_SITE_OTHER): Payer: Medicare Other | Admitting: Internal Medicine

## 2017-07-23 VITALS — BP 172/80 | HR 91 | Temp 98.5°F | Ht 60.0 in | Wt 203.0 lb

## 2017-07-23 DIAGNOSIS — R109 Unspecified abdominal pain: Secondary | ICD-10-CM | POA: Diagnosis present

## 2017-07-23 DIAGNOSIS — Z87892 Personal history of anaphylaxis: Secondary | ICD-10-CM | POA: Insufficient documentation

## 2017-07-23 DIAGNOSIS — K589 Irritable bowel syndrome without diarrhea: Secondary | ICD-10-CM | POA: Insufficient documentation

## 2017-07-23 DIAGNOSIS — Z90710 Acquired absence of both cervix and uterus: Secondary | ICD-10-CM | POA: Diagnosis not present

## 2017-07-23 DIAGNOSIS — R10821 Right upper quadrant rebound abdominal tenderness: Secondary | ICD-10-CM | POA: Diagnosis not present

## 2017-07-23 DIAGNOSIS — Z79899 Other long term (current) drug therapy: Secondary | ICD-10-CM | POA: Diagnosis not present

## 2017-07-23 DIAGNOSIS — E039 Hypothyroidism, unspecified: Secondary | ICD-10-CM | POA: Insufficient documentation

## 2017-07-23 DIAGNOSIS — Z9049 Acquired absence of other specified parts of digestive tract: Secondary | ICD-10-CM | POA: Diagnosis not present

## 2017-07-23 DIAGNOSIS — Z79890 Hormone replacement therapy (postmenopausal): Secondary | ICD-10-CM | POA: Diagnosis not present

## 2017-07-23 DIAGNOSIS — Z8041 Family history of malignant neoplasm of ovary: Secondary | ICD-10-CM | POA: Insufficient documentation

## 2017-07-23 DIAGNOSIS — Z23 Encounter for immunization: Secondary | ICD-10-CM | POA: Insufficient documentation

## 2017-07-23 DIAGNOSIS — R1031 Right lower quadrant pain: Principal | ICD-10-CM | POA: Insufficient documentation

## 2017-07-23 DIAGNOSIS — I880 Nonspecific mesenteric lymphadenitis: Secondary | ICD-10-CM | POA: Diagnosis not present

## 2017-07-23 DIAGNOSIS — Z818 Family history of other mental and behavioral disorders: Secondary | ICD-10-CM | POA: Insufficient documentation

## 2017-07-23 DIAGNOSIS — Z803 Family history of malignant neoplasm of breast: Secondary | ICD-10-CM | POA: Insufficient documentation

## 2017-07-23 DIAGNOSIS — K219 Gastro-esophageal reflux disease without esophagitis: Secondary | ICD-10-CM | POA: Insufficient documentation

## 2017-07-23 DIAGNOSIS — Z87891 Personal history of nicotine dependence: Secondary | ICD-10-CM | POA: Diagnosis not present

## 2017-07-23 DIAGNOSIS — Z882 Allergy status to sulfonamides status: Secondary | ICD-10-CM | POA: Insufficient documentation

## 2017-07-23 DIAGNOSIS — R1110 Vomiting, unspecified: Secondary | ICD-10-CM | POA: Diagnosis present

## 2017-07-23 DIAGNOSIS — Z833 Family history of diabetes mellitus: Secondary | ICD-10-CM | POA: Insufficient documentation

## 2017-07-23 DIAGNOSIS — G8929 Other chronic pain: Secondary | ICD-10-CM | POA: Insufficient documentation

## 2017-07-23 DIAGNOSIS — Z8249 Family history of ischemic heart disease and other diseases of the circulatory system: Secondary | ICD-10-CM | POA: Insufficient documentation

## 2017-07-23 DIAGNOSIS — F419 Anxiety disorder, unspecified: Secondary | ICD-10-CM | POA: Diagnosis not present

## 2017-07-23 DIAGNOSIS — R197 Diarrhea, unspecified: Secondary | ICD-10-CM | POA: Insufficient documentation

## 2017-07-23 DIAGNOSIS — Z800 Family history of malignant neoplasm of digestive organs: Secondary | ICD-10-CM | POA: Insufficient documentation

## 2017-07-23 DIAGNOSIS — Z7951 Long term (current) use of inhaled steroids: Secondary | ICD-10-CM | POA: Insufficient documentation

## 2017-07-23 DIAGNOSIS — Z888 Allergy status to other drugs, medicaments and biological substances status: Secondary | ICD-10-CM | POA: Diagnosis not present

## 2017-07-23 DIAGNOSIS — Z841 Family history of disorders of kidney and ureter: Secondary | ICD-10-CM | POA: Insufficient documentation

## 2017-07-23 DIAGNOSIS — Z91018 Allergy to other foods: Secondary | ICD-10-CM | POA: Insufficient documentation

## 2017-07-23 DIAGNOSIS — Z880 Allergy status to penicillin: Secondary | ICD-10-CM | POA: Diagnosis not present

## 2017-07-23 DIAGNOSIS — Z91013 Allergy to seafood: Secondary | ICD-10-CM | POA: Insufficient documentation

## 2017-07-23 DIAGNOSIS — H9190 Unspecified hearing loss, unspecified ear: Secondary | ICD-10-CM | POA: Insufficient documentation

## 2017-07-23 DIAGNOSIS — Z881 Allergy status to other antibiotic agents status: Secondary | ICD-10-CM | POA: Diagnosis not present

## 2017-07-23 DIAGNOSIS — R1903 Right lower quadrant abdominal swelling, mass and lump: Secondary | ICD-10-CM

## 2017-07-23 DIAGNOSIS — K7689 Other specified diseases of liver: Secondary | ICD-10-CM | POA: Insufficient documentation

## 2017-07-23 DIAGNOSIS — Z8619 Personal history of other infectious and parasitic diseases: Secondary | ICD-10-CM | POA: Insufficient documentation

## 2017-07-23 HISTORY — DX: Nausea with vomiting, unspecified: Z98.890

## 2017-07-23 HISTORY — DX: Other specified postprocedural states: Z98.890

## 2017-07-23 HISTORY — DX: Other specified postprocedural states: R11.2

## 2017-07-23 LAB — COMPREHENSIVE METABOLIC PANEL
ALT: 16 U/L (ref 14–54)
ANION GAP: 9 (ref 5–15)
AST: 20 U/L (ref 15–41)
Albumin: 3.7 g/dL (ref 3.5–5.0)
Alkaline Phosphatase: 71 U/L (ref 38–126)
BUN: <5 mg/dL — ABNORMAL LOW (ref 6–20)
CHLORIDE: 107 mmol/L (ref 101–111)
CO2: 23 mmol/L (ref 22–32)
CREATININE: 0.71 mg/dL (ref 0.44–1.00)
Calcium: 9.4 mg/dL (ref 8.9–10.3)
Glucose, Bld: 81 mg/dL (ref 65–99)
POTASSIUM: 4 mmol/L (ref 3.5–5.1)
SODIUM: 139 mmol/L (ref 135–145)
Total Bilirubin: 0.6 mg/dL (ref 0.3–1.2)
Total Protein: 6.8 g/dL (ref 6.5–8.1)

## 2017-07-23 LAB — CBC WITH DIFFERENTIAL/PLATELET
Basophils Absolute: 0 10*3/uL (ref 0.0–0.1)
Basophils Relative: 0 %
EOS ABS: 0.2 10*3/uL (ref 0.0–0.7)
Eosinophils Relative: 2 %
HEMATOCRIT: 41.2 % (ref 36.0–46.0)
HEMOGLOBIN: 14.2 g/dL (ref 12.0–15.0)
LYMPHS ABS: 3.5 10*3/uL (ref 0.7–4.0)
Lymphocytes Relative: 44 %
MCH: 31.3 pg (ref 26.0–34.0)
MCHC: 34.5 g/dL (ref 30.0–36.0)
MCV: 90.9 fL (ref 78.0–100.0)
Monocytes Absolute: 0.5 10*3/uL (ref 0.1–1.0)
Monocytes Relative: 6 %
NEUTROS ABS: 3.8 10*3/uL (ref 1.7–7.7)
NEUTROS PCT: 48 %
Platelets: 274 10*3/uL (ref 150–400)
RBC: 4.53 MIL/uL (ref 3.87–5.11)
RDW: 12.5 % (ref 11.5–15.5)
WBC: 7.9 10*3/uL (ref 4.0–10.5)

## 2017-07-23 MED ORDER — IOPAMIDOL (ISOVUE-300) INJECTION 61%
INTRAVENOUS | Status: AC
  Filled 2017-07-23: qty 100

## 2017-07-23 MED ORDER — ONDANSETRON HCL 4 MG PO TABS: 4.0000 mg | ORAL_TABLET | Freq: Four times a day (QID) | ORAL | Status: DC | PRN

## 2017-07-23 MED ORDER — PANTOPRAZOLE SODIUM 40 MG PO TBEC
40.0000 mg | DELAYED_RELEASE_TABLET | Freq: Every day | ORAL | Status: DC
  Administered 2017-07-23 – 2017-07-24 (×2): 40 mg via ORAL
  Filled 2017-07-23 (×2): qty 1

## 2017-07-23 MED ORDER — BUSPIRONE HCL 5 MG PO TABS
5.0000 mg | ORAL_TABLET | Freq: Every day | ORAL | Status: DC
  Administered 2017-07-24: 5 mg via ORAL
  Filled 2017-07-23: qty 1

## 2017-07-23 MED ORDER — ENOXAPARIN SODIUM 40 MG/0.4ML ~~LOC~~ SOLN
40.0000 mg | SUBCUTANEOUS | Status: DC
  Administered 2017-07-23: 40 mg via SUBCUTANEOUS
  Filled 2017-07-23: qty 0.4

## 2017-07-23 MED ORDER — SODIUM CHLORIDE 0.9 % IV SOLN
INTRAVENOUS | Status: DC
  Administered 2017-07-23 – 2017-07-24 (×2): via INTRAVENOUS

## 2017-07-23 MED ORDER — INFLUENZA VAC SPLIT QUAD 0.5 ML IM SUSY
0.5000 mL | PREFILLED_SYRINGE | INTRAMUSCULAR | Status: AC
  Administered 2017-07-24: 0.5 mL via INTRAMUSCULAR
  Filled 2017-07-23: qty 0.5

## 2017-07-23 MED ORDER — IOPAMIDOL (ISOVUE-300) INJECTION 61%
INTRAVENOUS | Status: AC
  Filled 2017-07-23: qty 30

## 2017-07-23 MED ORDER — BUSPIRONE HCL 10 MG PO TABS
10.0000 mg | ORAL_TABLET | Freq: Every day | ORAL | Status: DC
  Administered 2017-07-24: 10 mg via ORAL
  Filled 2017-07-23: qty 1

## 2017-07-23 MED ORDER — MORPHINE SULFATE (PF) 2 MG/ML IV SOLN
2.0000 mg | INTRAVENOUS | Status: DC | PRN
  Administered 2017-07-23 – 2017-07-24 (×4): 2 mg via INTRAVENOUS
  Filled 2017-07-23 (×4): qty 1

## 2017-07-23 MED ORDER — LEVOTHYROXINE SODIUM 25 MCG PO TABS
25.0000 ug | ORAL_TABLET | Freq: Every day | ORAL | Status: DC
  Administered 2017-07-24: 25 ug via ORAL
  Filled 2017-07-23: qty 1

## 2017-07-23 MED ORDER — ALPRAZOLAM 0.25 MG PO TABS
0.2500 mg | ORAL_TABLET | Freq: Every evening | ORAL | Status: DC | PRN
  Administered 2017-07-23: 0.25 mg via ORAL
  Filled 2017-07-23: qty 1

## 2017-07-23 MED ORDER — ONDANSETRON HCL 4 MG/2ML IJ SOLN
4.0000 mg | Freq: Four times a day (QID) | INTRAMUSCULAR | Status: DC | PRN
  Administered 2017-07-23: 4 mg via INTRAVENOUS
  Filled 2017-07-23: qty 2

## 2017-07-23 MED ORDER — BUSPIRONE HCL 10 MG PO TABS
20.0000 mg | ORAL_TABLET | Freq: Every day | ORAL | Status: DC
  Administered 2017-07-23: 20 mg via ORAL
  Filled 2017-07-23: qty 2

## 2017-07-23 MED ORDER — BUSPIRONE HCL 5 MG PO TABS: 5.0000 mg | ORAL_TABLET | Freq: Three times a day (TID) | ORAL | Status: DC

## 2017-07-23 NOTE — Progress Notes (Signed)
   CC: Stomach Pain  HPI:  Donna Davis is a 46 y.o. female with a past medical history listed below here today with complaints of stomach pain.   She reports that she developed acute onset RLQ pain 2 days ago. Reports the pain began periumbilical and has migrated to the RLQ. Pain is 10/10. Reports it is sharp and stabbing in nature but also cramping and dull at times. Has a history of IBS and chronic abdominal pain but reports this pain is different from any pain she has had previously. She denies any fevers but does report chills. Has had diarrhea without any hematochezia or melena. Has had nausea but no emesis until this morning when she had a non-bloody, non-biliary episode of emesis. Reports she has not been able to eat since the pain occurred and has not been able to tolerate fluids due to nausea. Denies any worsening of pain with eating or drinking. Had had hysterectomy and cholecystectomy in the past but still has her appendix.   Past Medical History:  Diagnosis Date  . Anxiety   . Family history of malignant neoplasm of gastrointestinal tract   . GERD (gastroesophageal reflux disease)   . Hearing difficulty    b/l, 2/2 congenital rubella s/p stapedectomy. Using hearing aid  . Hepatic cyst    6 mm on CT done done in 05/2005  . Hypothyroidism   . IBS (irritable bowel syndrome)   . Migraines    migraine  . Shingles 03/2013   Review of Systems:   Negative except as noted in HPI  Physical Exam:  Vitals:   07/23/17 1543  BP: (!) 172/80  Pulse: 91  Temp: 98.5 F (36.9 C)  TempSrc: Oral  SpO2: 98%  Weight: 203 lb (92.1 kg)  Height: 5' (1.524 m)   Physical Exam  Constitutional: She is oriented to person, place, and time. She appears unhealthy. She has a sickly appearance. She appears distressed.  HENT:  Head: Normocephalic and atraumatic.  Cardiovascular: Normal rate and regular rhythm.  Pulmonary/Chest: Effort normal and breath sounds normal.  Abdominal: Bowel sounds  are not hypoactive. There is tenderness in the right upper quadrant. There is rebound and tenderness at McBurney's point. There is no rigidity, no guarding, no CVA tenderness and negative Murphy's sign. No hernia.    + Psoas sign. + Rovsing's sign.  Neurological: She is alert and oriented to person, place, and time.  Skin: Skin is warm and dry. She is not diaphoretic.  Psychiatric: Mood and affect normal.    Assessment & Plan:   See Encounters Tab for problem based charting.  Patient seen with Dr. Evette Doffing

## 2017-07-23 NOTE — H&P (Signed)
Date: 07/23/2017               Patient Name:  Donna Davis MRN: [id number]  DOB: June 11, 1971 Age / Sex: 46 y.o., female   PCP: Sid Falcon, MD         Medical Service: Internal Medicine Teaching Service         Attending Physician: Dr. Aldine Contes, MD    First Contact: Dr. Johny Chess Pager: [phone]  Second Contact: Dr. Tiburcio Pea Pager: [phone]       After Hours (After 5p/  First Contact Pager: [phone]  weekends / holidays): Second Contact Pager: [phone]   Chief Complaint: Abdominal Pain  History of Present Illness: 46 year old female with a past medical history of GERD, IBS, hypothyroidism, hearing loss, anxiety who presented from the clinic with complaints of right lower abdominal pain.  The history was obtained with the assistance of a sign language interpreter  Patient reports she developed abdominal pain 2 days ago which started out periumbilical and has migrated to the right lower quadrant and increased in severity since onset.  Her pain worsens with movements and states she is most comfortable if staying completely still.  She experienced diarrhea with multiple bowel movements yesterday and nausea, 2 episodes of vomiting today.  She has had decreased p.o. intake since onset.  Denies hematochezia, melena, hematemesis.  She does have a history of chronic abdominal pain and IBS with normal CT scans in the past, as well as GERD.  However, she states this pain is different in character and more severe than previously experienced abdominal pain.  She has a history of several abdominal surgeries including cholecystectomy, hysterectomy, C-section, laparoscopic exploration for endometriosis.  She was admitted from clinic for further management and evaluation.  Meds:  No outpatient medications have been marked as taking for the 07/23/17 encounter [facility] Encounter).     Allergies: Allergies as of 07/23/2017 - Review Complete 07/23/2017  Allergen Reaction Noted  . Onion  Anaphylaxis 11/28/2010  . Other Other (See Comments) 02/29/2012  . Shellfish allergy Anaphylaxis and Swelling 09/01/2012  . Amitriptyline Anxiety, Palpitations, and Other (See Comments) 04/08/2013  . Amoxicillin-pot clavulanate Nausea And Vomiting   . Amoxicillin-pot clavulanate Nausea And Vomiting 03/04/2016  . Cefuroxime Nausea And Vomiting 08/06/2008  . Cephalexin Other (See Comments)   . Ciprofloxacin Other (See Comments)   . Clarithromycin Nausea And Vomiting and Other (See Comments)   . Clarithromycin Nausea And Vomiting 11/28/2010  . Doxycycline Other (See Comments) and Hives   . Propranolol Other (See Comments) 01/07/2013  . Sulfonamide derivatives Other (See Comments)   . Sulfa antibiotics Other (See Comments) 03/04/2016  . Benadryl [diphenhydramine hcl (sleep)] Anxiety 06/26/2013  . Shrimp flavor Other (See Comments) 11/28/2010   Past Medical History:  Diagnosis Date  . Anxiety   . Family history of malignant neoplasm of gastrointestinal tract   . GERD (gastroesophageal reflux disease)   . Hearing difficulty    b/l, 2/2 congenital rubella s/p stapedectomy. Using hearing aid  . Hepatic cyst    6 mm on CT done done in 05/2005  . Hypothyroidism   . IBS (irritable bowel syndrome)   . Migraines    migraine  . Shingles 03/2013    Family History:  Family History  Problem Relation Age of Onset  . Breast cancer Mother   . Ovarian cancer Mother   . Diabetes Mother   . Pancreatic cancer Father   . Prostate cancer Father   . Colon  cancer Father   . Diabetes Father   . Heart disease Father   . Irritable bowel syndrome Father   . Kidney disease Father   . Stomach cancer Maternal Grandmother   . Colon cancer Maternal Grandmother   . Diabetes Maternal Grandmother   . Heart disease Unknown        both grandmothers  . Bipolar disorder Daughter      Social History:  Social History   Tobacco Use  . Smoking status: Former Smoker    Types: Cigarettes    Last attempt to  quit: 02/18/2008    Years since quitting: 9.4  . Smokeless tobacco: Never Used  . Tobacco comment: quit 73yrs ago  Substance Use Topics  . Alcohol use: Yes    Alcohol/week: 0.0 oz    Comment: social  . Drug use: No     Review of Systems: A complete ROS was negative except as per HPI.   Physical Exam: Last menstrual period 12/26/2012. Physical Exam  Constitutional: She is oriented to person, place, and time.  Sitting in chair, uncomfortable and distressed   HENT:  Head: Normocephalic and atraumatic.  Mouth/Throat: Oropharynx is clear and moist.  Eyes: Pupils are equal, round, and reactive to light.  Cardiovascular: Normal rate, regular rhythm and normal heart sounds.  Pulmonary/Chest: Effort normal and breath sounds normal.  Abdominal:  Soft, +BS, non-distended, TTP at McBurney's point with rebound tenderness, + Rovsing's and psoas sign. Negative murphy's    Musculoskeletal: She exhibits no edema.  Neurological: She is alert and oriented to person, place, and time.  Deafness   Skin: Skin is warm and dry.  Vitals reviewed.   Assessment & Plan by Problem:  RLQ Abdominal Pain, Concern for Acute Appendicitis  Patient is presenting with a 2-day history of right lower quadrant abdominal pain with pain description and exam typical of acute appendicitis.  She does have a history of chronic abdominal pain and IBS and multiple abdominal surgeries.  In clinic she was afebrile, mildly hypertensive.  Labs unremarkable including normal white count.  CT abdomen ordered for further evaluation.  Other etiologies on differential include small bowel obstruction, gastroenteritis, IBS.  --F/u CT Abd and consult surgery if necessary --NPO --IVF --Zofran 4 mg q6hr prn --Pain control (appears to be on home tramadol, can trial and escalate as needed)      Hypothyroidism Patient has a history of hypothyroidism currently on Synthroid 25 mcg daily. --Continue Synthroid   Anxiety History of  anxiety, currently taking buspirone. --Cont home buspirone    Dispo: Admit patient to Inpatient with expected length of stay greater than 2 midnights.  Signed: Tawny Asal, MD 07/23/2017, 7:37 PM  Pager: [phone]

## 2017-07-23 NOTE — Assessment & Plan Note (Signed)
RLQ concerning for acute appendicitis. Will require admission for CT scan, IVF, pain control. CBC and CMET obtained in clinic. Discussed with inpatient team who will see patient for admission. Admission orders placed.

## 2017-07-23 NOTE — Addendum Note (Signed)
Addended by: Burgess Estelle A on: 07/23/2017 06:36 PM   Modules accepted: Miquel Dunn

## 2017-07-24 DIAGNOSIS — Z888 Allergy status to other drugs, medicaments and biological substances status: Secondary | ICD-10-CM

## 2017-07-24 DIAGNOSIS — E039 Hypothyroidism, unspecified: Secondary | ICD-10-CM | POA: Diagnosis not present

## 2017-07-24 DIAGNOSIS — Z880 Allergy status to penicillin: Secondary | ICD-10-CM

## 2017-07-24 DIAGNOSIS — R1031 Right lower quadrant pain: Secondary | ICD-10-CM

## 2017-07-24 DIAGNOSIS — R112 Nausea with vomiting, unspecified: Secondary | ICD-10-CM | POA: Diagnosis not present

## 2017-07-24 DIAGNOSIS — Z882 Allergy status to sulfonamides status: Secondary | ICD-10-CM

## 2017-07-24 DIAGNOSIS — R197 Diarrhea, unspecified: Secondary | ICD-10-CM

## 2017-07-24 DIAGNOSIS — Z881 Allergy status to other antibiotic agents status: Secondary | ICD-10-CM | POA: Diagnosis not present

## 2017-07-24 DIAGNOSIS — K219 Gastro-esophageal reflux disease without esophagitis: Secondary | ICD-10-CM | POA: Diagnosis not present

## 2017-07-24 DIAGNOSIS — Z91013 Allergy to seafood: Secondary | ICD-10-CM

## 2017-07-24 DIAGNOSIS — I880 Nonspecific mesenteric lymphadenitis: Secondary | ICD-10-CM | POA: Diagnosis not present

## 2017-07-24 DIAGNOSIS — K589 Irritable bowel syndrome without diarrhea: Secondary | ICD-10-CM | POA: Diagnosis not present

## 2017-07-24 DIAGNOSIS — Z23 Encounter for immunization: Secondary | ICD-10-CM | POA: Diagnosis not present

## 2017-07-24 DIAGNOSIS — R590 Localized enlarged lymph nodes: Secondary | ICD-10-CM | POA: Diagnosis not present

## 2017-07-24 LAB — CBC
HEMATOCRIT: 38.8 % (ref 36.0–46.0)
HEMOGLOBIN: 12.6 g/dL (ref 12.0–15.0)
MCH: 30.1 pg (ref 26.0–34.0)
MCHC: 32.5 g/dL (ref 30.0–36.0)
MCV: 92.6 fL (ref 78.0–100.0)
Platelets: 238 10*3/uL (ref 150–400)
RBC: 4.19 MIL/uL (ref 3.87–5.11)
RDW: 12.3 % (ref 11.5–15.5)
WBC: 7.9 10*3/uL (ref 4.0–10.5)

## 2017-07-24 LAB — BASIC METABOLIC PANEL
Anion gap: 6 (ref 5–15)
CHLORIDE: 105 mmol/L (ref 101–111)
CO2: 27 mmol/L (ref 22–32)
CREATININE: 0.79 mg/dL (ref 0.44–1.00)
Calcium: 8.4 mg/dL — ABNORMAL LOW (ref 8.9–10.3)
GFR calc Af Amer: >60 mL/min (ref >60)
GFR calc non Af Amer: >60 mL/min (ref >60)
Glucose, Bld: 84 mg/dL (ref 65–99)
POTASSIUM: 3.9 mmol/L (ref 3.5–5.1)
SODIUM: 138 mmol/L (ref 135–145)

## 2017-07-24 MED ORDER — ONDANSETRON HCL 4 MG PO TABS: 4.0000 mg | ORAL_TABLET | Freq: Three times a day (TID) | ORAL | 0 refills | Status: DC | PRN

## 2017-07-24 MED ORDER — TRAMADOL HCL 50 MG PO TABS
50.0000 mg | ORAL_TABLET | Freq: Four times a day (QID) | ORAL | Status: DC | PRN
  Administered 2017-07-24: 50 mg via ORAL
  Filled 2017-07-24: qty 1

## 2017-07-24 MED ORDER — TRAMADOL HCL 50 MG PO TABS: 50.0000 mg | ORAL_TABLET | Freq: Four times a day (QID) | ORAL | 0 refills | Status: DC | PRN

## 2017-07-24 NOTE — Progress Notes (Signed)
  Date: 07/24/2017  Patient name: Donna Davis record number: [id number]  Date of birth: Nov 11, 1970   I have seen and evaluated Donna Davis and discussed their care with the Residency Team.  In brief, patient is a 46 year old female with a past medical history of GERD, IBS, hypothyroidism, anxiety, hearing loss who presented from [facility] with worsening abdominal pain for 2 days.  Patient states that she developed sudden onset of abdominal pain 2 days ago which was initially periumbilical but now over her entire abdomen worse in the right lower quadrant.  She also complained of associated diarrhea and multiple bowel movements over the last couple of days as well as associated nausea with 2 episodes of vomiting on day of admission.  She states that the pain is different from her normal IBS pain was not relieved with defecation.  No fevers or chills, no lightheadedness, no syncope, no focal weakness, no tingling or numbness, no chest pain, no shortness of breath, no palpitations, no diaphoresis.  Patient states that her pain is similar to yesterday but that she would like to go home today.  PMHx, Fam Hx, and/or Soc Hx : As per resident admit note  Vitals:   07/23/17 2101 07/24/17 0529  BP: (!) 150/96 119/74  Pulse: 85 69  Resp: 18 18  Temp: 99.2 F (37.3 C)   SpO2: 99% 97%   General: Awake alert nourished x3, NAD CVS: Regular rate and rhythm, normal heart sounds Lungs: CTA bilaterally Abdomen: Soft, mild diffuse abdominal tenderness, no rebound, no guarding, normoactive bowel sounds Extremities: No edema noted  Assessment and Plan: I have seen and evaluated the patient as outlined above. I agree with the formulated Assessment and Plan as detailed in the residents' note, with the following changes:   1.  Worsening abdominal pain: -Patient presented to the internal medicine clinic with 2-day history of worsening abdominal pain especially right lower quadrant pain with associated nausea  and vomiting and diarrhea.  She was initially noted to have severe tenderness especially in the right lower quadrant as well as positive rebound tenderness concerning for possible appendicitis.  Patient had no leukocytosis, no fevers or chills and her CT abdomen pelvis showed only mesenteric adenitis but a normal appendix.  It is unlikely the patient had appendicitis.  I suspect that given her diarrhea and mesenteric adenitis she may have an underlying infectious gastroenteritis likely viral in nature. -We will advance patient's diet today -We will continue with pain control -we will DC morphine and resume home tramadol -DC IV fluids if tolerating oral -Discharge home today with close follow-up in internal medicine clinic if she is able to tolerate p.o. Intake    Aldine Contes, MD 11/7/20181:57 PM

## 2017-07-24 NOTE — Progress Notes (Signed)
Pt admitted from ED per stretcher accompanied by a nurse tech and pt husband, on arrival to the floor pt was fully alert and oriented, vital signs stable, pt is deaf blt ears cannot hear environmental noise, wants every explanation to be in a form of written on paper for her to read or use interpretor, she doesn't want husband to be use as an interpretor, skin is good low fall risk prescribed treatment started will continue to monitor

## 2017-07-24 NOTE — Progress Notes (Signed)
   Subjective: No acute events overnight, patient notes similar abdominal pain that is slightly improved.  She would like to try liquids but her appetite has not returned to baseline.  Diarrhea has improved  Objective:  Vital signs in last 24 hours: Vitals:   07/23/17 2101 07/24/17 0529 07/24/17 1300  BP: (!) 150/96 119/74 115/74  Pulse: 85 69 73  Resp: 18 18 18   Temp: 99.2 F (37.3 C)  98.2 F (36.8 C)  TempSrc: Oral  Oral  SpO2: 99% 97% 98%   General: Resting in bed, no acute distress CV: RRR, s1, s2  Resp: Clear breath sounds, normal work of breathing, no distress  Abd: Soft, +BS, TTP throughout abdomen, worse in RLQ--interval improvement compared to prior Extr: No edema Neuro: Alert and oriented x3 Skin: Warm, dry      Assessment/Plan:  RLQ Abdominal Pain, Gastroentiritis Patient presented with right lower quadrant abdominal pain and exam features concerning for acute appendicitis, however she was afebrile with normal white count and CT abdomen showed normal appendix.  Noted enlarged mesenteric lymph node and right lower abdomen.  At this point, favor the etiology of her presentation to be related to a gastroenteritis, likely viral, causing her symptoms and the visualized mesenteric adenitis. --Advance diet as tolerated --Transition to PO pain control -> Tramadol 50 mg q6hr prn --DC fluids when pt tolerates PO intake  --Zofran 4 mg q6hr prn   Dispo: Anticipated discharge in approximately 0-1 day(s).   Tawny Asal, MD 07/24/2017, 3:34 PM Pager: [phone]

## 2017-07-24 NOTE — Progress Notes (Signed)
Internal Medicine Clinic Attending  I saw and evaluated the patient.  I personally confirmed the key portions of the history and exam documented by Dr. Charlynn Grimes and I reviewed pertinent patient test results.  The assessment, diagnosis, and plan were formulated together and I agree with the documentation in the resident's note.  Patient with chronic intermittent abdominal pain due to IBS with frequent imaging in the past, presented today with classic symptoms of peritonitis, high risk for acute appendicitis. I agree with the need for another CT scan, patient is clear with [place] that this current pain is very different from her previous IBS-associated pain.

## 2017-07-24 NOTE — Progress Notes (Signed)
Patient discharge teaching given, including activity, diet, follow-up appoints, and medications. Patient verbalized understanding of all discharge instructions. IV access was d/c'd. Vitals are stable. Skin is intact except as charted in most recent assessments. Pt to be escorted out by NT, to be driven home by family. 

## 2017-07-24 NOTE — Addendum Note (Signed)
Addended by: Lalla Brothers T on: 07/24/2017 11:53 AM   Modules accepted: Level of Service

## 2017-07-24 NOTE — Discharge Summary (Signed)
Name: Donna Davis MRN: [id number] DOB: 19-Jul-1971 46 y.o. PCP: Sid Falcon, MD  Date of Admission: 07/23/2017  8:42 PM Date of Discharge: 07/24/2017 Attending Physician: Dr. Dareen Piano   Discharge Diagnosis: Active Problems:   RLQ abdominal pain   Abdominal pain   Discharge Medications: Allergies as of 07/24/2017      Reactions   Onion Anaphylaxis   Other Other (See Comments)   Ossmo Prep - drop in BP Steroids Went crazy   Shellfish Allergy Anaphylaxis, Swelling   Amitriptyline Anxiety, Palpitations, Other (See Comments)   hallucinations   Amoxicillin-pot Clavulanate Nausea And Vomiting   With high dose, low dose ok   Amoxicillin-pot Clavulanate Nausea And Vomiting   With high dose, low dose ok   Cefuroxime Nausea And Vomiting   Cephalexin Other (See Comments)    gi upset  gi upset   Ciprofloxacin Other (See Comments)    Fevers   Clarithromycin Nausea And Vomiting, Other (See Comments)   Pt is OK with azithromycin  gi upset   Clarithromycin Nausea And Vomiting   Pt is OK with azithromycin   Doxycycline Other (See Comments), Hives    gi upset  gi upset   Propranolol Other (See Comments)   Fatigue and makes her "feel crazy", refuses to take.   Sulfonamide Derivatives Other (See Comments)   vaginal blisters   Sulfa Antibiotics Other (See Comments)   Rash, vaginal blisters   Sulfasalazine Itching   Rash, vaginal blisters Rash, vaginal blisters   Benadryl [diphenhydramine Hcl (sleep)] Anxiety   Extreme agitation   Shrimp Flavor Other (See Comments)   unknown      Medication List    STOP taking these medications   predniSONE 10 MG tablet Commonly known as:  DELTASONE   promethazine 25 MG tablet Commonly known as:  PHENERGAN     TAKE these medications   albuterol 108 (90 Base) MCG/ACT inhaler Commonly known as:  PROVENTIL HFA;VENTOLIN HFA Inhale into the lungs every 6 (six) hours as needed for wheezing or shortness of breath.   ALPRAZolam 0.25 MG  tablet Commonly known as:  XANAX TAKE 1 TABLET BY MOUTH ONCE DAILY AT BEDTIME AS NEEDED   busPIRone 10 MG tablet Commonly known as:  BUSPAR Take 5-20 mg by mouth 3 (three) times daily. Takes 1 tab in am, 1/2 tab at lunch ad 1-2 tabs at bedtime   CALCIUM PO Take 1 tablet by mouth daily.   estradiol 1 MG tablet Commonly known as:  ESTRACE TAKE ONE TABLET BY MOUTH ONCE DAILY   famotidine 40 MG tablet Commonly known as:  PEPCID Take 1 tablet (40 mg total) by mouth 2 (two) times daily as needed for heartburn or indigestion.   fexofenadine 60 MG tablet Commonly known as:  ALLEGRA Take 1 tablet (60 mg total) by mouth 2 (two) times daily.   Fish Oil 1000 MG Caps Take 1,000 mg by mouth daily.   fluticasone 50 MCG/ACT nasal spray Commonly known as:  FLONASE Place 2 sprays into both nostrils daily.   ibuprofen 800 MG tablet Commonly known as:  ADVIL,MOTRIN Take 1 tablet (800 mg total) by mouth every 8 (eight) hours as needed.   ipratropium 0.06 % nasal spray Commonly known as:  ATROVENT Place 2 sprays into both nostrils 4 (four) times daily. For nasal congestion   levothyroxine 25 MCG tablet Commonly known as:  SYNTHROID, LEVOTHROID TAKE ONE TABLET BY MOUTH ONCE DAILY   montelukast 10 MG tablet Commonly known as:  SINGULAIR Take 1 tablet (10 mg total) by mouth at bedtime.   omeprazole 40 MG capsule Commonly known as:  PRILOSEC Take 1 capsule (40 mg total) by mouth daily.   ondansetron 4 MG tablet Commonly known as:  ZOFRAN Take 1 tablet (4 mg total) every 8 (eight) hours as needed by mouth for nausea or vomiting.   traMADol 50 MG tablet Commonly known as:  ULTRAM Take 1 tablet (50 mg total) by mouth every 6 (six) hours as needed. What changed:  Another medication with the same name was added. Make sure you understand how and when to take each.   traMADol 50 MG tablet Commonly known as:  ULTRAM Take 1 tablet (50 mg total) every 6 (six) hours as needed by mouth for  moderate pain or severe pain. What changed:  You were already taking a medication with the same name, and this prescription was added. Make sure you understand how and when to take each.   VITAMIN C PO Take 1 tablet by mouth daily.   VITAMIN D PO Take 1 tablet by mouth daily.       Disposition and follow-up:   Donna Davis was discharged from [facility] in Stable condition.  At the hospital follow up visit please address:  1.  -Ensure sx have resolved and she has maintained PO intake and hydration   2.  Labs / imaging needed at time of follow-up: None  3.  Pending labs/ test needing follow-up: None   Follow-up Appointments:   Hospital Course by problem list:   RLQ Abdominal Pain, Gastroenteritis Patient was admitted from clinic with right lower quadrant abdominal pain with associated N/V/D and exam features concerning for acute appendicitis including rebound tenderness at McBurney's point and Rovsing sign.  Subsequent CT scan showed a normal appendix, noted an enlarged mesenteric lymph node in the right lower abdomen.  She was managed with IV fluids, pain and nausea control, and n.p.o. status.  Overnight, her pain and symptoms improved and she was tolerating p.o. intake with liquids.  She was discharged with a short course of pain medication and antiemetics with close follow-up in the [facility] clinic.  Etiology of her presentation is felt to be a likely viral gastroenteritis causing her symptoms and the visualized mesenteric adenitis.  Discharge Vitals:   BP 115/74 (BP Location: Left Arm)   Pulse 73   Temp 98.2 F (36.8 C) (Oral)   Resp 18   LMP 12/26/2012   SpO2 98%   Pertinent Labs, Studies, and Procedures:  CBC Latest Ref Rng & Units 07/24/2017 07/23/2017 04/08/2017  WBC 4.0 - 10.5 K/uL 7.9 7.9 6.9  Hemoglobin 12.0 - 15.0 g/dL 12.6 14.2 13.0  Hematocrit 36.0 - 46.0 % 38.8 41.2 39.1  Platelets 150 - 400 K/uL 238 274 274   BMP Latest Ref Rng & Units  07/24/2017 07/23/2017 04/08/2017  Glucose 65 - 99 mg/dL 84 81 93  BUN 6 - 20 mg/dL <5(L) <5(L) 8  Creatinine 0.44 - 1.00 mg/dL 0.79 0.71 0.61  BUN/Creat Ratio 9 - 23 - - 13  Sodium 135 - 145 mmol/L 138 139 142  Potassium 3.5 - 5.1 mmol/L 3.9 4.0 4.6  Chloride 101 - 111 mmol/L 105 107 104  CO2 22 - 32 mmol/L 27 23 23   Calcium 8.9 - 10.3 mg/dL 8.4(L) 9.4 9.3     Discharge Instructions: Discharge Instructions    Discharge instructions   Complete by:  As directed    It was  a pleasure taking care of you Donna Davis. We made a follow-up appointment for you in internal medicine clinic [phone]) on November 14 at 10:45 AM to see how you are doing and make sure you are feeling better.      Signed: Tawny Asal, MD 07/25/2017, 3:43 PM   Pager: [phone]

## 2017-07-31 ENCOUNTER — Ambulatory Visit (INDEPENDENT_AMBULATORY_CARE_PROVIDER_SITE_OTHER): Payer: Medicare Other | Admitting: Internal Medicine

## 2017-07-31 VITALS — BP 138/75 | HR 93 | Temp 98.7°F | Wt 203.1 lb

## 2017-07-31 DIAGNOSIS — Z90721 Acquired absence of ovaries, unilateral: Secondary | ICD-10-CM | POA: Diagnosis not present

## 2017-07-31 DIAGNOSIS — R1031 Right lower quadrant pain: Secondary | ICD-10-CM | POA: Diagnosis not present

## 2017-07-31 DIAGNOSIS — R590 Localized enlarged lymph nodes: Secondary | ICD-10-CM

## 2017-07-31 DIAGNOSIS — Z8742 Personal history of other diseases of the female genital tract: Secondary | ICD-10-CM | POA: Diagnosis not present

## 2017-07-31 NOTE — Patient Instructions (Signed)
It was a pleasure to see you today Donna Davis.  I agree we need to look for an ovarian source of your symptoms. It is possible you have a cyst that did not show up on the CT scan, and this could cause the inflammatory changes seen. We will get a transvaginal ultrasound and have you seen at gynecology clinic. You can continue taking advil as needed in the meantime. We will let you know if there are findings needing a change of this plan.

## 2017-07-31 NOTE — Progress Notes (Signed)
   CC: Right sided abdominal pain  HPI:  Donna Davis is a 46 y.o. female with PMHx detailed below presenting with right lower quadrant abdominal pain.  See problem based assessment and plan below for additional details.  RLQ abdominal pain She was recently admitted to the hospital for severe RLQ pain concerning for appendicitis but did not have this on CT imaging. She did have some enlarged reactive RLQ lymph nodes. Her diarrhea and vomiting are resolved but she continues to have severe RLQ pain and tenderness despite this. She is concerned for ovarian cysts because she has a history of painful cysts on the left leading to oophorectomy on that side. She does not feel any pain in her groin and denies any vaginal discharge or itching. The pain does not radiate to her flank or back. Currently her pain is constant and not well relived with oral medication. Assessment Persistent RLQ pain with normal CT imaging We do need to exclude gynecological causes for this pain that may not have been apparent on CT imaging Plan Transvaginal pelvic ultrasound Recommend she see her gynecologist's office, ideally right after getting the study    Past Medical History:  Diagnosis Date  . Anxiety   . Family history of malignant neoplasm of gastrointestinal tract   . GERD (gastroesophageal reflux disease)   . Hearing difficulty    b/l, 2/2 congenital rubella s/p stapedectomy. Using hearing aid  . Hepatic cyst    6 mm on CT done done in 05/2005  . Hypothyroidism   . IBS (irritable bowel syndrome)   . Migraines    "frequency depends on my stress level" (07/23/2017)  . PONV (postoperative nausea and vomiting)    "sometimes; depends on how long I'm under"  . Shingles 03/2013    Review of Systems: Review of Systems  Constitutional: Negative for chills and fever.  Respiratory: Negative for shortness of breath.   Cardiovascular: Negative for leg swelling.  Gastrointestinal: Positive for abdominal pain.  Negative for blood in stool, nausea and vomiting.  Genitourinary: Negative for dysuria.  Musculoskeletal: Negative for back pain.  Skin: Negative for rash.     Physical Exam: Vitals:   07/31/17 1052  BP: 138/75  Pulse: 93  Temp: 98.7 F (37.1 C)  TempSrc: Oral  SpO2: 98%  Weight: 203 lb 1.6 oz (92.1 kg)   GENERAL- alert, co-operative, NAD HEENT- Oral mucosa appears moist CARDIAC- RRR, no murmurs, rubs or gallops. RESP- CTAB, no wheezes or crackles. ABDOMEN- Tenderness to palpation in RLQ extending to RUQ, worse on rebound, normal bowel sounds present BACK- No CVA tenderness. SKIN- Warm, dry, No rash or lesion.   Assessment & Plan:   See encounters tab for problem based medical decision making.   Patient discussed with Dr. Beryle Beams

## 2017-08-01 ENCOUNTER — Encounter (INDEPENDENT_AMBULATORY_CARE_PROVIDER_SITE_OTHER): Payer: Self-pay

## 2017-08-02 ENCOUNTER — Encounter: Payer: Self-pay | Admitting: Obstetrics & Gynecology

## 2017-08-02 NOTE — Progress Notes (Signed)
Medicine attending: Medical history, presenting problems, physical findings, and medications, reviewed with resident physician Dr Christopher Rice on the day of the patient visit and I concur with his evaluation and management plan. 

## 2017-08-02 NOTE — Assessment & Plan Note (Addendum)
She was recently admitted to the hospital for severe RLQ pain concerning for appendicitis but did not have this on CT imaging. She did have some enlarged reactive RLQ lymph nodes. Her diarrhea and vomiting are resolved but she continues to have severe RLQ pain and tenderness despite this. She is concerned for ovarian cysts because she has a history of painful cysts on the left leading to oophorectomy on that side. She does not feel any pain in her groin and denies any vaginal discharge or itching. The pain does not radiate to her flank or back. Currently her pain is constant and not well relived with oral medication. Assessment Persistent RLQ pain with normal CT imaging We do need to exclude gynecological causes for this pain that may not have been apparent on CT imaging I do not see any evidence on exam or history suggesting PID as an explanation for this Plan Transvaginal pelvic ultrasound Recommend she see her gynecologist's office, ideally right after getting the study

## 2017-08-06 ENCOUNTER — Ambulatory Visit (HOSPITAL_COMMUNITY)
Admission: RE | Admit: 2017-08-06 | Discharge: 2017-08-06 | Disposition: A | Discharge status: Home / Self Care | Payer: Medicare Other | Source: Ambulatory Visit | Attending: Oncology | Admitting: Oncology

## 2017-08-06 DIAGNOSIS — R1031 Right lower quadrant pain: Secondary | ICD-10-CM | POA: Diagnosis not present

## 2017-08-06 DIAGNOSIS — F411 Generalized anxiety disorder: Secondary | ICD-10-CM | POA: Diagnosis not present

## 2017-08-06 DIAGNOSIS — M9901 Segmental and somatic dysfunction of cervical region: Secondary | ICD-10-CM | POA: Diagnosis not present

## 2017-08-06 DIAGNOSIS — M5030 Other cervical disc degeneration, unspecified cervical region: Secondary | ICD-10-CM | POA: Diagnosis not present

## 2017-08-09 ENCOUNTER — Encounter (INDEPENDENT_AMBULATORY_CARE_PROVIDER_SITE_OTHER): Payer: Self-pay

## 2017-08-12 ENCOUNTER — Other Ambulatory Visit: Payer: Self-pay | Admitting: *Deleted

## 2017-08-12 ENCOUNTER — Other Ambulatory Visit: Payer: Self-pay

## 2017-08-12 MED ORDER — ALPRAZOLAM 0.25 MG PO TABS: ORAL_TABLET | ORAL | 3 refills | Status: DC

## 2017-08-12 NOTE — Telephone Encounter (Signed)
Alprazolam called in to [facility].

## 2017-08-12 NOTE — Telephone Encounter (Signed)
ALPRAZolam (XANAX) 0.25 MG tablet  Refill request @ [facility] on [address].

## 2017-08-13 DIAGNOSIS — M5030 Other cervical disc degeneration, unspecified cervical region: Secondary | ICD-10-CM | POA: Diagnosis not present

## 2017-08-13 DIAGNOSIS — M9901 Segmental and somatic dysfunction of cervical region: Secondary | ICD-10-CM | POA: Diagnosis not present

## 2017-08-13 NOTE — Telephone Encounter (Signed)
Xanax rx called to [facility]. 

## 2017-08-14 DIAGNOSIS — M9901 Segmental and somatic dysfunction of cervical region: Secondary | ICD-10-CM | POA: Diagnosis not present

## 2017-08-14 DIAGNOSIS — M5030 Other cervical disc degeneration, unspecified cervical region: Secondary | ICD-10-CM | POA: Diagnosis not present

## 2017-08-23 ENCOUNTER — Ambulatory Visit (INDEPENDENT_AMBULATORY_CARE_PROVIDER_SITE_OTHER): Payer: Medicare Other | Admitting: Obstetrics & Gynecology

## 2017-08-23 ENCOUNTER — Encounter: Payer: Self-pay | Admitting: Obstetrics & Gynecology

## 2017-08-23 ENCOUNTER — Other Ambulatory Visit (HOSPITAL_COMMUNITY)
Admission: RE | Admit: 2017-08-23 | Discharge: 2017-08-23 | Disposition: A | Discharge status: Home / Self Care | Payer: Medicare Other | Source: Ambulatory Visit | Attending: Obstetrics & Gynecology | Admitting: Obstetrics & Gynecology

## 2017-08-23 VITALS — BP 137/82 | HR 81 | Ht 60.0 in | Wt 203.2 lb

## 2017-08-23 DIAGNOSIS — N809 Endometriosis, unspecified: Secondary | ICD-10-CM | POA: Diagnosis not present

## 2017-08-23 DIAGNOSIS — Z79890 Hormone replacement therapy (postmenopausal): Secondary | ICD-10-CM | POA: Insufficient documentation

## 2017-08-23 DIAGNOSIS — N760 Acute vaginitis: Secondary | ICD-10-CM | POA: Insufficient documentation

## 2017-08-23 DIAGNOSIS — H9190 Unspecified hearing loss, unspecified ear: Secondary | ICD-10-CM | POA: Insufficient documentation

## 2017-08-23 DIAGNOSIS — R102 Pelvic and perineal pain unspecified side: Secondary | ICD-10-CM

## 2017-08-23 DIAGNOSIS — M9901 Segmental and somatic dysfunction of cervical region: Secondary | ICD-10-CM | POA: Diagnosis not present

## 2017-08-23 DIAGNOSIS — Z9049 Acquired absence of other specified parts of digestive tract: Secondary | ICD-10-CM | POA: Insufficient documentation

## 2017-08-23 DIAGNOSIS — Z90721 Acquired absence of ovaries, unilateral: Secondary | ICD-10-CM | POA: Diagnosis not present

## 2017-08-23 DIAGNOSIS — E039 Hypothyroidism, unspecified: Secondary | ICD-10-CM | POA: Insufficient documentation

## 2017-08-23 DIAGNOSIS — K589 Irritable bowel syndrome without diarrhea: Secondary | ICD-10-CM | POA: Insufficient documentation

## 2017-08-23 DIAGNOSIS — N736 Female pelvic peritoneal adhesions (postinfective): Secondary | ICD-10-CM | POA: Insufficient documentation

## 2017-08-23 DIAGNOSIS — M5030 Other cervical disc degeneration, unspecified cervical region: Secondary | ICD-10-CM | POA: Diagnosis not present

## 2017-08-23 DIAGNOSIS — Z90710 Acquired absence of both cervix and uterus: Secondary | ICD-10-CM | POA: Insufficient documentation

## 2017-08-23 DIAGNOSIS — K219 Gastro-esophageal reflux disease without esophagitis: Secondary | ICD-10-CM | POA: Diagnosis not present

## 2017-08-23 MED ORDER — GABAPENTIN 300 MG PO CAPS: 300.0000 mg | ORAL_CAPSULE | Freq: Three times a day (TID) | ORAL | 3 refills | Status: DC

## 2017-08-23 MED ORDER — METRONIDAZOLE 500 MG PO TABS: 500.0000 mg | ORAL_TABLET | Freq: Two times a day (BID) | ORAL | 0 refills | Status: DC

## 2017-08-23 MED ORDER — FLUCONAZOLE 150 MG PO TABS: 150.0000 mg | ORAL_TABLET | ORAL | 3 refills | Status: DC

## 2017-08-23 MED ORDER — TRAMADOL HCL 50 MG PO TABS: 50.0000 mg | ORAL_TABLET | Freq: Four times a day (QID) | ORAL | 2 refills | Status: DC | PRN

## 2017-08-23 NOTE — Progress Notes (Signed)
GYNECOLOGY OFFICE VISIT NOTE  History:  46 y.o. [id number] here today for evaluation of RLQ pain.  Due to hearing impairment, an ASL interpreter was present during the history-taking and subsequent discussion (and for the physical exam) with this patient.  Patient has a history of endometriosis s/p TAH, LSO; has residual right ovary. Also history of pelvic adhesions, s/p enterolysis.  She is on HRT, was doing well until recently when she restarted vigorous exercise and sexual acitvity which caused debilitating RLQ pain. Had negative recent CT scan (just had some RLQ lymph nodes concerning for mesenteric adenitis, but the lymph nodes were present in 2016 scan) and pelvic ultrasound.  Taking Tramadol, Ibuprofen and Tylenol for the pain but it is not alleviated.  Has been evaluated by her PCP, but no etiology found, they are concerned about GYN etiology. Pain starts in RLQ and radiates to right flank area, worsened by exercise and intercourse.  Also reports vaginal irration and abnormal discharge x 2 weeks, self-treated three times with OTC antifungal creams.  She denies any abnormal vaginal bleeding, pelvic pain or other concerns.   Past Medical History:  Diagnosis Date  . Anxiety   . Endometriosis    s/p TAH, LSO  . Family history of malignant neoplasm of gastrointestinal tract   . GERD (gastroesophageal reflux disease)   . Hearing difficulty    b/l, 2/2 congenital rubella s/p stapedectomy. Using hearing aid  . Hepatic cyst    6 mm on CT done done in 05/2005  . Hypothyroidism   . IBS (irritable bowel syndrome)   . Migraines    "frequency depends on my stress level" (07/23/2017)  . PONV (postoperative nausea and vomiting)    "sometimes; depends on how long I'm under"  . Shingles 03/2013    Past Surgical History:  Procedure Laterality Date  . ABDOMINAL ADHESION SURGERY     Enterolysis  . CESAREAN SECTION  1995  . CHOLECYSTECTOMY N/A 06/26/2013   Procedure: LAPAROSCOPIC CHOLECYSTECTOMY WITH  ATTEMPTED INTRAOPERATIVE CHOLANGIOGRAM;  Surgeon: Harl Bowie, MD;  Location: [facility];  Service: General;  Laterality: N/A;  . COCHLEAR IMPLANT  03/30/2016  . COCHLEAR IMPLANT Right 03/2015  . COLONOSCOPY WITH PROPOFOL  04/23/2012  . DIAGNOSTIC LAPAROSCOPY  01/14/2006   "I've had several" (07/23/2017)  . ESOPHAGOGASTRODUODENOSCOPY (EGD) WITH PROPOFOL  04/23/2012  . JOINT REPLACEMENT    . KNEE SURGERY Right 2014  . LAPAROSCOPIC OVARIAN CYSTECTOMY Right 04/16/2000  . SALPINGOOPHORECTOMY Left   . STAPEDECTOMY Left 05/14/2006  . Thyroid Nodule removed  1997  . TONSILLECTOMY    . TOTAL ABDOMINAL HYSTERECTOMY  01/13/2004   Endometriosis with residual right ovary. Multiple GYN surgeries for chronic pelvic pain; had LSO in past  . TUBAL LIGATION    . WOUND EXPLORATION Right 10/16/2016   Procedure: RIGHT EXPLORATION OF LACERATION OF INDEX FINGER;  Surgeon: Leanora Cover, MD;  Location: [facility];  Service: Orthopedics;  Laterality: Right;    The following portions of the patient's history were reviewed and updated as appropriate: allergies, current medications, past family history, past medical history, past social history, past surgical history and problem list.   Health Maintenance:  Normal mammogram in 2016.   Review of Systems:  Pertinent items noted in HPI and remainder of comprehensive ROS otherwise negative.  Objective:  Physical Exam BP 137/82   Pulse 81   Ht 5' (1.524 m)   Wt 203 lb 3.2 oz (92.2 kg)   LMP 12/26/2012   BMI 39.68  kg/m  CONSTITUTIONAL: Well-developed, well-nourished female in no acute distress.  HENT:  Normocephalic, atraumatic. External right and left ear normal. Oropharynx is clear and moist EYES: Conjunctivae and EOM are normal. Pupils are equal, round, and reactive to light. No scleral icterus.  NECK: Normal range of motion, supple, no masses SKIN: Skin is warm and dry. No rash noted. Not diaphoretic. No erythema. No pallor. NEUROLOGIC:  Alert and oriented to person, place, and time. Normal reflexes, muscle tone coordination. No cranial nerve deficit noted. PSYCHIATRIC: Normal mood and affect. Normal behavior. Normal judgment and thought content. CARDIOVASCULAR: Normal heart rate noted RESPIRATORY: Effort and breath sounds normal, no problems with respiration noted ABDOMEN: Soft, no distention noted.   PELVIC: Normal appearing external genitalia with mild erythema; normal appearing vaginal mucosa and cuff. White discharge noted, testing sample obtained.  No palpable masses, ?right adnexal tenderness. MUSCULOSKELETAL: Normal range of motion. No edema noted.  Labs and Imaging US Transvaginal Non-ob  Result Date: 08/07/2017 CLINICAL DATA:  Right lower quadrant pain for 3 weeks. Status post hysterectomy EXAM: TRANSABDOMINAL AND TRANSVAGINAL ULTRASOUND OF PELVIS TECHNIQUE: Both transabdominal and transvaginal ultrasound examinations of the pelvis were performed. Transabdominal technique was performed for global imaging of the pelvis including uterus, ovaries, adnexal regions, and pelvic cul-de-sac. It was necessary to proceed with endovaginal exam following the transabdominal exam to visualize the pelvic structures. COMPARISON:  07/23/2017 CT FINDINGS: Uterus Measurements: Prior hysterectomy. Endometrium Thickness: N/A. Right ovary Measurements: Not visualized. Reported prior partial oophorectomy. No adnexal mass seen. Left ovary Measurements: Prior oophorectomy. Normal appearance/no adnexal mass. Other findings No abnormal free fluid. IMPRESSION: Unremarkable postoperative pelvis.  No adnexal mass seen. Electronically Signed   By: Rolm Baptise M.D.   On: 08/07/2017 08:18   US Pelvis Complete  Result Date: 08/07/2017 CLINICAL DATA:  Right lower quadrant pain for 3 weeks. Status post hysterectomy EXAM: TRANSABDOMINAL AND TRANSVAGINAL ULTRASOUND OF PELVIS TECHNIQUE: Both transabdominal and transvaginal ultrasound examinations of the pelvis  were performed. Transabdominal technique was performed for global imaging of the pelvis including uterus, ovaries, adnexal regions, and pelvic cul-de-sac. It was necessary to proceed with endovaginal exam following the transabdominal exam to visualize the pelvic structures. COMPARISON:  07/23/2017 CT FINDINGS: Uterus Measurements: Prior hysterectomy. Endometrium Thickness: N/A. Right ovary Measurements: Not visualized. Reported prior partial oophorectomy. No adnexal mass seen. Left ovary Measurements: Prior oophorectomy. Normal appearance/no adnexal mass. Other findings No abnormal free fluid. IMPRESSION: Unremarkable postoperative pelvis.  No adnexal mass seen. Electronically Signed   By: Rolm Baptise M.D.   On: 08/07/2017 08:18   CT ABDOMEN AND PELVIS WITH CONTRAST CLINICAL DATA:  Pt c/o non-localized abdominal pain x 2 days with N/V/D. Appendicitis suspected 100 ML ISOVUE 300 IV  TECHNIQUE: Multidetector CT imaging of the abdomen and pelvis was performed using the standard protocol following bolus administration of intravenous contrast.  CONTRAST:  100 cc Isovue-300  COMPARISON:  03/04/2016  FINDINGS: Lower chest: Mild dependent changes in the lung bases bilaterally.  Hepatobiliary: Status post cholecystectomy. The liver is homogeneous without focal mass.  Pancreas: Unremarkable. No pancreatic ductal dilatation or surrounding inflammatory changes.  Spleen: Normal in size without focal abnormality.  Adrenals/Urinary Tract: Adrenal glands are normal in appearance. No hydronephrosis or renal mass. Urinary bladder is normal in appearance.  Stomach/Bowel: The stomach and small bowel loops are normal in appearance. The appendix is well seen and has a normal appearance. Loops of colon are normal in appearance.  Vascular/Lymphatic: There are small right lower quadrant mesenteric lymph  nodes, largest measuring approximately 1 cm. No significant retroperitoneal adenopathy. There  is atherosclerotic calcification of the abdominal aorta, not associated with aneurysm. There is normal vascular opacification of the celiac axis, superior mesenteric artery, and inferior mesenteric artery. Normal appearance of the portal venous system and inferior vena cava.  Reproductive: Status post hysterectomy.  No adnexal mass.  Other: No free pelvic fluid. Anterior abdominal wall is unremarkable.  Musculoskeletal: No acute or significant osseous findings.  IMPRESSION: 1. Normal appendix. 2. Right lower quadrant mesenteric lymph nodes raising the question of mesenteric adenitis. 3. No bowel obstruction or abscess. 4. Status post cholecystectomy and hysterectomy.   Electronically Signed   By: Nolon Nations M.D.   On: 07/23/2017 21:56  Assessment & Plan:  1. 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 Endometriosis 3. Pelvic adhesive disease Concerned about neuropathic pain or adhesive disease; unlikely endometriosis flare given acute onset of symptoms.  Will try Neurontin in addition to pain medication, follow up response. If pain continues or worsens, may need laparoscopy and possible RSO. She as cautioned that RSO may not solve this pain and may increase risk of adhesive disease. Will continue to monitor. - traMADol (ULTRAM) 50 MG tablet; Take 1 tablet (50 mg total) by mouth every 6 (six) hours as needed for moderate pain or severe pain.  Dispense: 60 tablet; Refill: 2 - gabapentin (NEURONTIN) 300 MG capsule; Take 1 capsule (300 mg total) by mouth 3 (three) times daily.  Dispense: 30 capsule; Refill: 3  4. Vulvovaginitis Will follow up testing; presumptive treatment needed.  - fluconazole (DIFLUCAN) 150 MG tablet; Take 1 tablet (150 mg total) by mouth every 3 (three) days. For three doses  Dispense: 3 tablet; Refill: 3 - metroNIDAZOLE (FLAGYL) 500 MG tablet; Take 1 tablet (500 mg total) by mouth 2 (two) times daily.  Dispense: 14 tablet; Refill: 0 - Cervicovaginal  ancillary only   Routine preventative health maintenance measures emphasized. Please refer to After Visit Summary for other counseling recommendations.   Return in about 3 weeks (around 09/13/2017) for Followup pelvic pain.   Total face-to-face time with patient: 25 minutes.  Over 50% of encounter was spent on counseling and coordination of care.   Verita Schneiders, MD, [place] Attending [place], [facility] for [employer], [place]

## 2017-08-23 NOTE — Progress Notes (Signed)
States was going to work out in gym but had to stop because of the pain - about 5-6 weeks.

## 2017-08-23 NOTE — Patient Instructions (Signed)
Return to clinic for any scheduled appointments or obstetric concerns, or go to [facility] for evaluation  

## 2017-08-27 ENCOUNTER — Encounter: Payer: Self-pay | Admitting: Obstetrics & Gynecology

## 2017-08-27 LAB — CERVICOVAGINAL ANCILLARY ONLY
BACTERIAL VAGINITIS: NEGATIVE
CANDIDA VAGINITIS: NEGATIVE
CHLAMYDIA, DNA PROBE: NEGATIVE
Neisseria Gonorrhea: NEGATIVE
Trichomonas: NEGATIVE

## 2017-09-02 DIAGNOSIS — H903 Sensorineural hearing loss, bilateral: Secondary | ICD-10-CM | POA: Diagnosis not present

## 2017-09-13 ENCOUNTER — Encounter: Payer: Self-pay | Admitting: Obstetrics & Gynecology

## 2017-09-13 ENCOUNTER — Ambulatory Visit (INDEPENDENT_AMBULATORY_CARE_PROVIDER_SITE_OTHER): Payer: Medicare Other | Admitting: Obstetrics & Gynecology

## 2017-09-13 VITALS — BP 134/68 | HR 72 | Ht 60.0 in | Wt 202.8 lb

## 2017-09-13 DIAGNOSIS — M9901 Segmental and somatic dysfunction of cervical region: Secondary | ICD-10-CM | POA: Diagnosis not present

## 2017-09-13 DIAGNOSIS — R102 Pelvic and perineal pain: Secondary | ICD-10-CM | POA: Diagnosis not present

## 2017-09-13 DIAGNOSIS — N736 Female pelvic peritoneal adhesions (postinfective): Secondary | ICD-10-CM

## 2017-09-13 DIAGNOSIS — M5030 Other cervical disc degeneration, unspecified cervical region: Secondary | ICD-10-CM | POA: Diagnosis not present

## 2017-09-13 NOTE — Progress Notes (Signed)
GYNECOLOGY OFFICE VISIT NOTE  History:  46 y.o. [id number] here today for follow up of pelvic pain management. Due to hearing impairment, an ASL interpreter was present during the history-taking and subsequent discussion (and for the physical exam) with this patient.  Patient has a history of endometriosis s/p TAH, LSO; has residual right ovary. Also history of pelvic adhesions, s/p enterolysis.  Had multiple surgeries including ones performed in 2012 to try to get to right ovary but was noted to have bowel/omental adhesions around the pelvis. Recent imaging did not show any adnexal masses; no other acute etiology of her pain.  Was prescribed Tramadol and Neurontin; but she reports minimal relief from these in addition to Ibuprofen and tylenol.  Wants to exercise, but cannot due to pain.  She denies any abnormal vaginal discharge, bleeding or other concerns.   Past Medical History:  Diagnosis Date  . Anxiety   . Endometriosis    s/p TAH, LSO  . Family history of malignant neoplasm of gastrointestinal tract   . GERD (gastroesophageal reflux disease)   . Hearing difficulty    b/l, 2/2 congenital rubella s/p stapedectomy. Using hearing aid  . Hepatic cyst    6 mm on CT done done in 05/2005  . Hypothyroidism   . IBS (irritable bowel syndrome)   . Migraines    "frequency depends on my stress level" (07/23/2017)  . PONV (postoperative nausea and vomiting)    "sometimes; depends on how long I'm under"  . Shingles 03/2013    Past Surgical History:  Procedure Laterality Date  . ABDOMINAL ADHESION SURGERY     Enterolysis  . CESAREAN SECTION  1995  . CHOLECYSTECTOMY N/A 06/26/2013   Procedure: LAPAROSCOPIC CHOLECYSTECTOMY WITH ATTEMPTED INTRAOPERATIVE CHOLANGIOGRAM;  Surgeon: Harl Bowie, MD;  Location: [facility];  Service: General;  Laterality: N/A;  . COCHLEAR IMPLANT  03/30/2016  . COCHLEAR IMPLANT Right 03/2015  . COLONOSCOPY WITH PROPOFOL  04/23/2012  . DIAGNOSTIC LAPAROSCOPY   01/14/2006   "I've had several" (07/23/2017)  . ESOPHAGOGASTRODUODENOSCOPY (EGD) WITH PROPOFOL  04/23/2012  . JOINT REPLACEMENT    . KNEE SURGERY Right 2014  . LAPAROSCOPIC OVARIAN CYSTECTOMY Right 04/16/2000  . SALPINGOOPHORECTOMY Left   . STAPEDECTOMY Left 05/14/2006  . Thyroid Nodule removed  1997  . TONSILLECTOMY    . TOTAL ABDOMINAL HYSTERECTOMY  01/13/2004   Endometriosis with residual right ovary. Multiple GYN surgeries for chronic pelvic pain; had LSO in past  . TUBAL LIGATION    . WOUND EXPLORATION Right 10/16/2016   Procedure: RIGHT EXPLORATION OF LACERATION OF INDEX FINGER;  Surgeon: Leanora Cover, MD;  Location: [place];  Service: Orthopedics;  Laterality: Right;    The following portions of the patient's history were reviewed and updated as appropriate: allergies, current medications, past family history, past medical history, past social history, past surgical history and problem list.   Health Maintenance:   Normal mammogram in 2016.  Review of Systems:  Pertinent items noted in HPI and remainder of comprehensive ROS otherwise negative.  Objective:  Physical Exam BP 134/68   Pulse 72   Ht 5' (1.524 m)   Wt 202 lb 12.8 oz (92 kg)   LMP 12/26/2012   BMI 39.61 kg/m  CONSTITUTIONAL: Well-developed, well-nourished female in no acute distress.  HENT:  Normocephalic, atraumatic. External right and left ear normal. Oropharynx is clear and moist EYES: Conjunctivae and EOM are normal. Pupils are equal, round, and reactive to light. No scleral icterus.  NECK:  Normal range of motion, supple, no masses SKIN: Skin is warm and dry. No rash noted. Not diaphoretic. No erythema. No pallor. NEUROLOGIC: Alert and oriented to person, place, and time. Normal reflexes, muscle tone coordination. No cranial nerve deficit noted. PSYCHIATRIC: Normal mood and affect. Normal behavior. Normal judgment and thought content. CARDIOVASCULAR: Normal heart rate noted RESPIRATORY:  Effort and breath sounds normal, no problems with respiration noted ABDOMEN: Soft, no distention noted.  No current tenderness today. PELVIC: Deferred MUSCULOSKELETAL: Normal range of motion. No edema noted.  Labs and Imaging 08/06/2017  TRANSABDOMINAL AND TRANSVAGINAL ULTRASOUND OF PELVIS  CLINICAL DATA:  Right lower quadrant pain for 3 weeks. Status post hysterectomy  TECHNIQUE: Both transabdominal and transvaginal ultrasound examinations of the pelvis were performed. Transabdominal technique was performed for global imaging of the pelvis including uterus, ovaries, adnexal regions, and pelvic cul-de-sac. It was necessary to proceed with endovaginal exam following the transabdominal exam to visualize the pelvic structures.  COMPARISON:  07/23/2017 CT  FINDINGS: Uterus  Measurements: Prior hysterectomy.  Endometrium  Thickness: N/A.  Right ovary  Measurements: Not visualized. Reported prior partial oophorectomy. No adnexal mass seen.  Left ovary  Measurements: Prior oophorectomy. Normal appearance/no adnexal mass.  Other findings  No abnormal free fluid.  IMPRESSION: Unremarkable postoperative pelvis.  No adnexal mass seen.   Electronically Signed   By: Rolm Baptise M.D.   On: 08/07/2017 08:18   07/23/2017 CT ABDOMEN AND PELVIS WITH CONTRAST CLINICAL DATA: Pt c/o non-localized abdominal pain x 2 days with N/V/D. Appendicitis suspected 100 ML ISOVUE 300 IV  TECHNIQUE: Multidetector CT imaging of the abdomen and pelvis was performed using the standard protocol following bolus administration of intravenous contrast.  CONTRAST: 100 cc Isovue-300  COMPARISON: 03/04/2016  FINDINGS: Lower chest: Mild dependent changes in the lung bases bilaterally.  Hepatobiliary: Status post cholecystectomy. The liver is homogeneous without focal mass.  Pancreas: Unremarkable. No pancreatic ductal dilatation or surrounding inflammatory  changes.  Spleen: Normal in size without focal abnormality.  Adrenals/Urinary Tract: Adrenal glands are normal in appearance. No hydronephrosis or renal mass. Urinary bladder is normal in appearance.  Stomach/Bowel: The stomach and small bowel loops are normal in appearance. The appendix is well seen and has a normal appearance. Loops of colon are normal in appearance.  Vascular/Lymphatic: There are small right lower quadrant mesenteric lymph nodes, largest measuring approximately 1 cm. No significant retroperitoneal adenopathy. There is atherosclerotic calcification of the abdominal aorta, not associated with aneurysm. There is normal vascular opacification of the celiac axis, superior mesenteric artery, and inferior mesenteric artery. Normal appearance of the portal venous system and inferior vena cava.  Reproductive: Status post hysterectomy. No adnexal mass.  Other: No free pelvic fluid. Anterior abdominal wall is unremarkable.  Musculoskeletal: No acute or significant osseous findings.  IMPRESSION: 1. Normal appendix. 2. Right lower quadrant mesenteric lymph nodes raising the question of mesenteric adenitis. 3. No bowel obstruction or abscess. 4. Status post cholecystectomy and hysterectomy.   Electronically Signed By: Nolon Nations M.D. On: 07/23/2017 21:56    Assessment & Plan:  1. 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 Pelvic adhesive disease Patient is not a good gynecologic surgical candidate, no acute GYN etiology even though she has a residual right ovary.  Given the level of her adhesions, and bowel involvement, surgery may be more harmful at this point. Had a very detailed conversation with her about this.   Patient declines stronger pain medication or referral to Pain clinic, does want to see General Surgery  to see if they are willing to do enterolysis or lysis of adhesions. Referral made for patient.  - Ambulatory referral to General  Surgery Pain precautions reviewed. Continue current medications as needed.   Routine preventative health maintenance measures emphasized. Please refer to After Visit Summary for other counseling recommendations.   Return for any GYN concerns.   Total face-to-face time with patient: 25 minutes.  Over 50% of encounter was spent on counseling and coordination of care.   Verita Schneiders, MD, [place] for [employer], [place]

## 2017-09-30 DIAGNOSIS — M50322 Other cervical disc degeneration at C5-C6 level: Secondary | ICD-10-CM | POA: Diagnosis not present

## 2017-09-30 DIAGNOSIS — M5136 Other intervertebral disc degeneration, lumbar region: Secondary | ICD-10-CM | POA: Diagnosis not present

## 2017-09-30 DIAGNOSIS — M9901 Segmental and somatic dysfunction of cervical region: Secondary | ICD-10-CM | POA: Diagnosis not present

## 2017-09-30 DIAGNOSIS — M9903 Segmental and somatic dysfunction of lumbar region: Secondary | ICD-10-CM | POA: Diagnosis not present

## 2017-10-09 DIAGNOSIS — H903 Sensorineural hearing loss, bilateral: Secondary | ICD-10-CM | POA: Diagnosis not present

## 2017-10-11 ENCOUNTER — Encounter: Payer: Self-pay | Admitting: Internal Medicine

## 2017-10-11 ENCOUNTER — Ambulatory Visit (INDEPENDENT_AMBULATORY_CARE_PROVIDER_SITE_OTHER): Payer: Medicare Other | Admitting: Internal Medicine

## 2017-10-11 DIAGNOSIS — J069 Acute upper respiratory infection, unspecified: Secondary | ICD-10-CM

## 2017-10-11 DIAGNOSIS — Z87891 Personal history of nicotine dependence: Secondary | ICD-10-CM | POA: Diagnosis not present

## 2017-10-11 MED ORDER — GUAIFENESIN-CODEINE 100-10 MG/5ML PO SYRP: 5.0000 mL | ORAL_SOLUTION | Freq: Three times a day (TID) | ORAL | 0 refills | Status: DC | PRN

## 2017-10-11 NOTE — Progress Notes (Signed)
   CC: Upper respiratory sx  HPI:  [name] is a 47 y.o. female with past medical history as described below who presents to the clinic with complaints of cold symptoms.  She reports a 3-day history of a nonproductive cough, generalized myalgias, subjective fever and chills, sore throat, nausea and decreased appetite.  She denies vomiting or nasal congestion.  She has been attempting to stay hydrated with Gatorade.  She feels her symptoms have worsened since onset and are at their worst today.  She has no known sick contacts.  She has tried various over-the-counter medications with no relief.  Past Medical History:  Diagnosis Date  . Anxiety   . Endometriosis    s/p TAH, LSO  . Family history of malignant neoplasm of gastrointestinal tract   . GERD (gastroesophageal reflux disease)   . Hearing difficulty    b/l, 2/2 congenital rubella s/p stapedectomy. Using hearing aid  . Hepatic cyst    6 mm on CT done done in 05/2005  . Hypothyroidism   . IBS (irritable bowel syndrome)   . Migraines    "frequency depends on my stress level" (07/23/2017)  . PONV (postoperative nausea and vomiting)    "sometimes; depends on how long I'm under"  . Shingles 03/2013   Review of Systems:  Review of Systems  HENT: Positive for sore throat. Negative for congestion.   Respiratory: Positive for cough. Negative for sputum production.   Gastrointestinal: Positive for nausea. Negative for vomiting.  Musculoskeletal: Positive for myalgias.     Physical Exam:  Vitals:   10/11/17 1438  BP: (!) 149/96  Pulse: 83  Temp: 98.7 F (37.1 C)  TempSrc: Oral  SpO2: 98%  Weight: 203 lb 9.6 oz (92.4 kg)   General: Sitting on exam table, ill-appearing, respiratory mask in place HEENT: No exudate of posterior pharynx, no cervical lymphadenopathy, moist mucous membranes, PERRL CV: Regular rate and rhythm, no murmur Resp: Clear breath sounds bilaterally without crackles or wheezing, normal work of  breathing, no distress  Abd: Soft, +BS, nontender to palpation Extr: No lower extremity edema, good peripheral pulses Neuro: Alert and oriented x3 Skin: Warm, dry, normal skin turgor    Assessment & Plan:   See Encounters Tab for problem based charting.  Patient discussed with Dr. Daryll Drown

## 2017-10-11 NOTE — Patient Instructions (Addendum)
Good to see you today [name].  It is likely that her symptoms are being caused by a respiratory virus.  These can take 7-10 days to fully resolve and some your symptoms such as your cough may persist for a little longer.  While your body is fighting the infection we can continue to try over-the-counter and other medicines to help improve your symptoms as much as possible.  For aches and fever- Tylenol (1000 mg up to 3 times a day) or ibuprofen (600 to 800 mg up to three times a day) can be helpful.  For your cough, we sent a prescription for a stronger cough syrup that will hopefully improve your cough and allow you to get some rest and feel better--be careful as this medicine may make you feel sleepy.  If you fail to improve over the expected time, do not hesitate to call the clinic or come back in for reevaluation.

## 2017-10-11 NOTE — Assessment & Plan Note (Signed)
Patient is presenting with signs symptoms consistent with a viral URI.  She is afebrile, and her symptoms have had a gradual worsening rather than abrupt onset.  She also received flu shot this year and is outside the 48-hour window that would indicate Tamiflu treatment.  We will continue to provide symptomatic and supportive care and prescribed short supply of a higher strength cough syrup.  Expectant management and return precautions provided.  --Robitussin AC x 5 days  --Cont supportive care

## 2017-10-14 NOTE — Progress Notes (Signed)
Internal Medicine Clinic Attending  Case discussed with Dr. Harden at the time of the visit.  We reviewed the resident's history and exam and pertinent patient test results.  I agree with the assessment, diagnosis, and plan of care documented in the resident's note.  

## 2017-10-15 ENCOUNTER — Other Ambulatory Visit: Payer: Self-pay | Admitting: Internal Medicine

## 2017-10-15 NOTE — Telephone Encounter (Signed)
PT WANTS REFILL ON COUGH MEDICATION,SEND TO Suzie Portela [phone]

## 2017-10-15 NOTE — Telephone Encounter (Signed)
Stated she only received 1/2 amount prescribed of Robitussin AC. And will need new rx. I talked to the pharmacist at [facility] - stated with restriction of Codeine; they can only fill up to 5 days. Called pt back - informed of pharmacist's response; stated ok. And will call if needs f/u appt.

## 2017-10-17 ENCOUNTER — Encounter: Payer: Self-pay | Admitting: Radiology

## 2017-10-17 DIAGNOSIS — G8929 Other chronic pain: Secondary | ICD-10-CM | POA: Diagnosis not present

## 2017-10-17 DIAGNOSIS — R102 Pelvic and perineal pain: Secondary | ICD-10-CM | POA: Diagnosis not present

## 2017-10-23 ENCOUNTER — Encounter: Payer: Self-pay | Admitting: Obstetrics & Gynecology

## 2017-10-25 ENCOUNTER — Other Ambulatory Visit: Payer: Self-pay | Admitting: Surgery

## 2017-10-25 DIAGNOSIS — R102 Pelvic and perineal pain: Principal | ICD-10-CM

## 2017-10-25 DIAGNOSIS — G8929 Other chronic pain: Secondary | ICD-10-CM

## 2017-10-29 DIAGNOSIS — H903 Sensorineural hearing loss, bilateral: Secondary | ICD-10-CM | POA: Diagnosis not present

## 2017-10-29 DIAGNOSIS — H838X2 Other specified diseases of left inner ear: Secondary | ICD-10-CM | POA: Diagnosis not present

## 2017-10-29 DIAGNOSIS — H9193 Unspecified hearing loss, bilateral: Secondary | ICD-10-CM | POA: Diagnosis not present

## 2017-10-31 ENCOUNTER — Other Ambulatory Visit: Payer: Self-pay | Admitting: General Practice

## 2017-10-31 DIAGNOSIS — N809 Endometriosis, unspecified: Secondary | ICD-10-CM

## 2017-10-31 DIAGNOSIS — R102 Pelvic and perineal pain: Secondary | ICD-10-CM

## 2017-10-31 MED ORDER — GABAPENTIN 300 MG PO CAPS: 300.0000 mg | ORAL_CAPSULE | Freq: Three times a day (TID) | ORAL | 0 refills | Status: DC

## 2017-10-31 MED ORDER — TRAMADOL HCL 50 MG PO TABS: 50.0000 mg | ORAL_TABLET | Freq: Four times a day (QID) | ORAL | 0 refills | Status: DC | PRN

## 2017-11-01 ENCOUNTER — Other Ambulatory Visit: Payer: Self-pay | Admitting: Internal Medicine

## 2017-11-11 DIAGNOSIS — M5136 Other intervertebral disc degeneration, lumbar region: Secondary | ICD-10-CM | POA: Diagnosis not present

## 2017-11-11 DIAGNOSIS — M9901 Segmental and somatic dysfunction of cervical region: Secondary | ICD-10-CM | POA: Diagnosis not present

## 2017-11-11 DIAGNOSIS — M50322 Other cervical disc degeneration at C5-C6 level: Secondary | ICD-10-CM | POA: Diagnosis not present

## 2017-11-11 DIAGNOSIS — M9903 Segmental and somatic dysfunction of lumbar region: Secondary | ICD-10-CM | POA: Diagnosis not present

## 2017-11-19 ENCOUNTER — Ambulatory Visit
Admission: RE | Admit: 2017-11-19 | Discharge: 2017-11-19 | Disposition: A | Discharge status: Home / Self Care | Payer: Medicare Other | Source: Ambulatory Visit | Attending: Surgery | Admitting: Surgery

## 2017-11-19 DIAGNOSIS — R102 Pelvic and perineal pain: Secondary | ICD-10-CM | POA: Diagnosis not present

## 2017-11-19 DIAGNOSIS — N809 Endometriosis, unspecified: Secondary | ICD-10-CM | POA: Diagnosis not present

## 2017-11-19 DIAGNOSIS — M9903 Segmental and somatic dysfunction of lumbar region: Secondary | ICD-10-CM | POA: Diagnosis not present

## 2017-11-19 DIAGNOSIS — M50322 Other cervical disc degeneration at C5-C6 level: Secondary | ICD-10-CM | POA: Diagnosis not present

## 2017-11-19 DIAGNOSIS — M5136 Other intervertebral disc degeneration, lumbar region: Secondary | ICD-10-CM | POA: Diagnosis not present

## 2017-11-19 DIAGNOSIS — M9901 Segmental and somatic dysfunction of cervical region: Secondary | ICD-10-CM | POA: Diagnosis not present

## 2017-11-19 DIAGNOSIS — G8929 Other chronic pain: Secondary | ICD-10-CM

## 2017-11-19 MED ORDER — GADOBENATE DIMEGLUMINE 529 MG/ML IV SOLN
15.0000 mL | Freq: Once | INTRAVENOUS | Status: AC | PRN
  Administered 2017-11-19: 15 mL via INTRAVENOUS

## 2017-11-25 ENCOUNTER — Other Ambulatory Visit: Payer: Self-pay | Admitting: Obstetrics & Gynecology

## 2017-11-25 DIAGNOSIS — M5136 Other intervertebral disc degeneration, lumbar region: Secondary | ICD-10-CM | POA: Diagnosis not present

## 2017-11-25 DIAGNOSIS — M50322 Other cervical disc degeneration at C5-C6 level: Secondary | ICD-10-CM | POA: Diagnosis not present

## 2017-11-25 DIAGNOSIS — R102 Pelvic and perineal pain: Secondary | ICD-10-CM

## 2017-11-25 DIAGNOSIS — N809 Endometriosis, unspecified: Secondary | ICD-10-CM

## 2017-11-25 DIAGNOSIS — M9901 Segmental and somatic dysfunction of cervical region: Secondary | ICD-10-CM | POA: Diagnosis not present

## 2017-11-25 DIAGNOSIS — M9903 Segmental and somatic dysfunction of lumbar region: Secondary | ICD-10-CM | POA: Diagnosis not present

## 2017-11-26 ENCOUNTER — Telehealth: Payer: Self-pay | Admitting: *Deleted

## 2017-11-26 NOTE — Telephone Encounter (Signed)
Encounter opened in error

## 2017-11-27 ENCOUNTER — Other Ambulatory Visit: Payer: Self-pay | Admitting: Obstetrics & Gynecology

## 2017-11-27 DIAGNOSIS — R102 Pelvic and perineal pain: Secondary | ICD-10-CM

## 2017-12-02 DIAGNOSIS — M50322 Other cervical disc degeneration at C5-C6 level: Secondary | ICD-10-CM | POA: Diagnosis not present

## 2017-12-02 DIAGNOSIS — M9903 Segmental and somatic dysfunction of lumbar region: Secondary | ICD-10-CM | POA: Diagnosis not present

## 2017-12-02 DIAGNOSIS — M9901 Segmental and somatic dysfunction of cervical region: Secondary | ICD-10-CM | POA: Diagnosis not present

## 2017-12-02 DIAGNOSIS — M5136 Other intervertebral disc degeneration, lumbar region: Secondary | ICD-10-CM | POA: Diagnosis not present

## 2017-12-03 DIAGNOSIS — M9903 Segmental and somatic dysfunction of lumbar region: Secondary | ICD-10-CM | POA: Diagnosis not present

## 2017-12-03 DIAGNOSIS — M50322 Other cervical disc degeneration at C5-C6 level: Secondary | ICD-10-CM | POA: Diagnosis not present

## 2017-12-03 DIAGNOSIS — M5136 Other intervertebral disc degeneration, lumbar region: Secondary | ICD-10-CM | POA: Diagnosis not present

## 2017-12-03 DIAGNOSIS — M9901 Segmental and somatic dysfunction of cervical region: Secondary | ICD-10-CM | POA: Diagnosis not present

## 2017-12-03 DIAGNOSIS — F411 Generalized anxiety disorder: Secondary | ICD-10-CM | POA: Diagnosis not present

## 2017-12-05 DIAGNOSIS — M50322 Other cervical disc degeneration at C5-C6 level: Secondary | ICD-10-CM | POA: Diagnosis not present

## 2017-12-05 DIAGNOSIS — M9903 Segmental and somatic dysfunction of lumbar region: Secondary | ICD-10-CM | POA: Diagnosis not present

## 2017-12-05 DIAGNOSIS — M9901 Segmental and somatic dysfunction of cervical region: Secondary | ICD-10-CM | POA: Diagnosis not present

## 2017-12-05 DIAGNOSIS — M5136 Other intervertebral disc degeneration, lumbar region: Secondary | ICD-10-CM | POA: Diagnosis not present

## 2017-12-10 ENCOUNTER — Other Ambulatory Visit: Payer: Self-pay | Admitting: *Deleted

## 2017-12-10 ENCOUNTER — Encounter: Payer: Self-pay | Admitting: Internal Medicine

## 2017-12-11 MED ORDER — ALPRAZOLAM 0.25 MG PO TABS: ORAL_TABLET | ORAL | 2 refills | Status: DC

## 2017-12-18 ENCOUNTER — Other Ambulatory Visit: Payer: Self-pay | Admitting: *Deleted

## 2017-12-18 DIAGNOSIS — R102 Pelvic and perineal pain: Secondary | ICD-10-CM

## 2017-12-19 ENCOUNTER — Other Ambulatory Visit: Payer: Self-pay | Admitting: Obstetrics and Gynecology

## 2017-12-19 DIAGNOSIS — R102 Pelvic and perineal pain: Secondary | ICD-10-CM

## 2017-12-19 DIAGNOSIS — N809 Endometriosis, unspecified: Secondary | ICD-10-CM

## 2017-12-19 MED ORDER — TRAMADOL HCL 50 MG PO TABS: ORAL_TABLET | ORAL | 0 refills | Status: DC

## 2017-12-23 ENCOUNTER — Telehealth: Payer: Self-pay | Admitting: General Practice

## 2017-12-23 NOTE — Telephone Encounter (Signed)
Patient called and left message on nurse line stating she talked to nurse previously this week about a prescription refill and she hasn't heard anything back. Called patient with interpreter [phone] stating I am returning her phone call and asked what medication she was calling in regards to and she states tramadol. Told patient it looks like it was sent in already but I would call the pharmacy to make sure there wasn't a problem with her Rx. Asked patient if she heard anything from pain management center and she states no. Told patient I would call and check on the status of that as well. Patient verbalized understanding and asked about refill on BP medication. Told patient she needs to contact her PCP. Patient verbalized understanding & had no other questions. Called [facility] pain management who states they received the records but not the referral form. Referral form faxed. Will send mychart message to patient.

## 2017-12-27 ENCOUNTER — Other Ambulatory Visit: Payer: Self-pay | Admitting: *Deleted

## 2017-12-27 ENCOUNTER — Encounter: Payer: Self-pay | Admitting: *Deleted

## 2017-12-27 DIAGNOSIS — R102 Pelvic and perineal pain: Secondary | ICD-10-CM

## 2018-01-01 ENCOUNTER — Other Ambulatory Visit: Payer: Self-pay | Admitting: Surgery

## 2018-01-01 DIAGNOSIS — M546 Pain in thoracic spine: Secondary | ICD-10-CM | POA: Diagnosis not present

## 2018-01-01 DIAGNOSIS — M545 Low back pain: Secondary | ICD-10-CM | POA: Diagnosis not present

## 2018-01-01 DIAGNOSIS — R102 Pelvic and perineal pain: Secondary | ICD-10-CM | POA: Diagnosis not present

## 2018-01-01 DIAGNOSIS — M9903 Segmental and somatic dysfunction of lumbar region: Secondary | ICD-10-CM | POA: Diagnosis not present

## 2018-01-01 DIAGNOSIS — G44209 Tension-type headache, unspecified, not intractable: Secondary | ICD-10-CM | POA: Diagnosis not present

## 2018-01-01 DIAGNOSIS — G8929 Other chronic pain: Secondary | ICD-10-CM | POA: Diagnosis not present

## 2018-01-02 DIAGNOSIS — M9903 Segmental and somatic dysfunction of lumbar region: Secondary | ICD-10-CM | POA: Diagnosis not present

## 2018-01-02 DIAGNOSIS — M50322 Other cervical disc degeneration at C5-C6 level: Secondary | ICD-10-CM | POA: Diagnosis not present

## 2018-01-02 DIAGNOSIS — M5136 Other intervertebral disc degeneration, lumbar region: Secondary | ICD-10-CM | POA: Diagnosis not present

## 2018-01-02 DIAGNOSIS — M9901 Segmental and somatic dysfunction of cervical region: Secondary | ICD-10-CM | POA: Diagnosis not present

## 2018-01-05 ENCOUNTER — Encounter: Payer: Self-pay | Admitting: Internal Medicine

## 2018-01-05 ENCOUNTER — Other Ambulatory Visit: Payer: Self-pay | Admitting: Internal Medicine

## 2018-01-06 ENCOUNTER — Telehealth: Payer: Self-pay | Admitting: *Deleted

## 2018-01-06 ENCOUNTER — Encounter: Payer: Self-pay | Admitting: *Deleted

## 2018-01-06 ENCOUNTER — Other Ambulatory Visit: Payer: Self-pay | Admitting: Internal Medicine

## 2018-01-06 DIAGNOSIS — E039 Hypothyroidism, unspecified: Secondary | ICD-10-CM

## 2018-01-06 MED ORDER — LEVOTHYROXINE SODIUM 25 MCG PO TABS: 25.0000 ug | ORAL_TABLET | Freq: Every day | ORAL | 3 refills | Status: DC

## 2018-01-08 NOTE — Telephone Encounter (Signed)
Dr. Daryll Drown is currently unavailable and will not have any openings until July.   Unable to schedule her an appointment for July as this time because schedule is not in Epic.  Patient can call back in mid-June or be seen in [facility]

## 2018-01-09 ENCOUNTER — Telehealth: Payer: Self-pay | Admitting: *Deleted

## 2018-01-09 NOTE — Telephone Encounter (Signed)
Called pt got vm. Left message for pt to call our office back about her referral.

## 2018-01-09 NOTE — Telephone Encounter (Signed)
Donna Davis called via [employer] and left  A voicemail this am stating she had contacted the clinic and got an email from [place] that said to contact the pain clinic. States she called the pain clinic and they said they got over 71 pages of information but no referral . States she is not sure what to do now. She would like a call back to discuss this and also wants to know if there is any other medicine she can take for the nonstop pain .

## 2018-01-10 DIAGNOSIS — M9903 Segmental and somatic dysfunction of lumbar region: Secondary | ICD-10-CM | POA: Diagnosis not present

## 2018-01-10 DIAGNOSIS — M9901 Segmental and somatic dysfunction of cervical region: Secondary | ICD-10-CM | POA: Diagnosis not present

## 2018-01-10 DIAGNOSIS — M5136 Other intervertebral disc degeneration, lumbar region: Secondary | ICD-10-CM | POA: Diagnosis not present

## 2018-01-10 DIAGNOSIS — M50322 Other cervical disc degeneration at C5-C6 level: Secondary | ICD-10-CM | POA: Diagnosis not present

## 2018-01-10 NOTE — Telephone Encounter (Signed)
[facility]- they have received the referral and will set the patient up at the Pelvic Pain clinic. Review will take about a week and they should contact the patient by next Friday.  Patient called into front office stating she is returning our phone call. Discussed with patient [facility] has received everything now and will contact her by the end of next week to set up an appt. Patient verbalized understanding & states the tramadol is helping minimally lately and the gabapentin makes her extremely tired, anxious, & panicked so she tries not to take it often. Patient states she will try to hold out until her appt with [facility]. Patient had no questions

## 2018-01-16 DIAGNOSIS — M9903 Segmental and somatic dysfunction of lumbar region: Secondary | ICD-10-CM | POA: Diagnosis not present

## 2018-01-16 DIAGNOSIS — G546 Phantom limb syndrome with pain: Secondary | ICD-10-CM | POA: Diagnosis not present

## 2018-01-16 DIAGNOSIS — M546 Pain in thoracic spine: Secondary | ICD-10-CM | POA: Diagnosis not present

## 2018-01-16 DIAGNOSIS — M545 Low back pain: Secondary | ICD-10-CM | POA: Diagnosis not present

## 2018-01-20 DIAGNOSIS — T85698A Other mechanical complication of other specified internal prosthetic devices, implants and grafts, initial encounter: Secondary | ICD-10-CM | POA: Diagnosis not present

## 2018-01-20 DIAGNOSIS — F419 Anxiety disorder, unspecified: Secondary | ICD-10-CM | POA: Diagnosis not present

## 2018-01-20 DIAGNOSIS — Z79899 Other long term (current) drug therapy: Secondary | ICD-10-CM | POA: Diagnosis not present

## 2018-01-20 DIAGNOSIS — H6593 Unspecified nonsuppurative otitis media, bilateral: Secondary | ICD-10-CM | POA: Diagnosis not present

## 2018-01-20 DIAGNOSIS — E039 Hypothyroidism, unspecified: Secondary | ICD-10-CM | POA: Diagnosis not present

## 2018-01-20 DIAGNOSIS — K219 Gastro-esophageal reflux disease without esophagitis: Secondary | ICD-10-CM | POA: Diagnosis not present

## 2018-01-20 DIAGNOSIS — Z87891 Personal history of nicotine dependence: Secondary | ICD-10-CM | POA: Diagnosis not present

## 2018-01-20 DIAGNOSIS — R0683 Snoring: Secondary | ICD-10-CM | POA: Diagnosis not present

## 2018-01-20 DIAGNOSIS — H903 Sensorineural hearing loss, bilateral: Secondary | ICD-10-CM | POA: Diagnosis not present

## 2018-01-21 ENCOUNTER — Other Ambulatory Visit: Payer: Self-pay | Admitting: *Deleted

## 2018-01-21 DIAGNOSIS — R102 Pelvic and perineal pain: Secondary | ICD-10-CM

## 2018-01-21 MED ORDER — GABAPENTIN 300 MG PO CAPS: ORAL_CAPSULE | ORAL | 3 refills | Status: DC

## 2018-01-21 NOTE — Telephone Encounter (Signed)
Received fax requesting refill Gabapentin 90 day

## 2018-01-24 ENCOUNTER — Other Ambulatory Visit: Payer: Self-pay | Admitting: Obstetrics and Gynecology

## 2018-01-24 DIAGNOSIS — N809 Endometriosis, unspecified: Secondary | ICD-10-CM

## 2018-01-24 DIAGNOSIS — R102 Pelvic and perineal pain: Secondary | ICD-10-CM

## 2018-01-27 ENCOUNTER — Telehealth: Payer: Self-pay | Admitting: *Deleted

## 2018-01-27 DIAGNOSIS — N809 Endometriosis, unspecified: Secondary | ICD-10-CM

## 2018-01-27 DIAGNOSIS — R102 Pelvic and perineal pain: Secondary | ICD-10-CM

## 2018-01-27 NOTE — Telephone Encounter (Signed)
Voice mail message received from pt on 5/10 @ 1431. She stated that she was finally able to contact the pain clinic and was told that her file had been sent to the wrong department twice. She is awaiting an appointment which may take several more weeks. Pt is requesting Rx refill of Tramadol. Chart routed to Dr. Harolyn Rutherford for review.

## 2018-01-28 MED ORDER — TRAMADOL HCL 50 MG PO TABS: ORAL_TABLET | ORAL | 3 refills | Status: DC

## 2018-01-30 NOTE — Telephone Encounter (Signed)
PT PREFERS [facility] [address]  "Please do not call in to [facility] [address] any longer" per pt. Please call pt to confirm it has been done. Also please advise if the pharmacy has the right to ask for her diagnosis to be able to fill. Pt states [facility] is refusing to fill unless provider gives diagnosis.  [phone] pt call back ph number.

## 2018-02-03 DIAGNOSIS — N9489 Other specified conditions associated with female genital organs and menstrual cycle: Secondary | ICD-10-CM | POA: Diagnosis not present

## 2018-02-03 DIAGNOSIS — M25551 Pain in right hip: Secondary | ICD-10-CM | POA: Diagnosis not present

## 2018-02-03 DIAGNOSIS — R102 Pelvic and perineal pain: Secondary | ICD-10-CM | POA: Diagnosis not present

## 2018-02-03 NOTE — Telephone Encounter (Signed)
Called [facility] and spoke w/pharmacist. He stated that pt picked up Tramadol on 5/16. I asked questions regarding pt's concerns them needing to know her diagnosis. He explained that when pain meds are prescribed they need to know what it is used for (i.e. Leg pain, back pain, etc) This is now a [facility] requirement and common with other pharmacies as well.

## 2018-02-18 DIAGNOSIS — H838X2 Other specified diseases of left inner ear: Secondary | ICD-10-CM | POA: Diagnosis not present

## 2018-02-18 DIAGNOSIS — Z9621 Cochlear implant status: Secondary | ICD-10-CM | POA: Diagnosis not present

## 2018-02-18 DIAGNOSIS — M545 Low back pain: Secondary | ICD-10-CM | POA: Diagnosis not present

## 2018-02-18 DIAGNOSIS — M9903 Segmental and somatic dysfunction of lumbar region: Secondary | ICD-10-CM | POA: Diagnosis not present

## 2018-02-18 DIAGNOSIS — M546 Pain in thoracic spine: Secondary | ICD-10-CM | POA: Diagnosis not present

## 2018-02-18 DIAGNOSIS — G546 Phantom limb syndrome with pain: Secondary | ICD-10-CM | POA: Diagnosis not present

## 2018-02-18 DIAGNOSIS — H903 Sensorineural hearing loss, bilateral: Secondary | ICD-10-CM | POA: Diagnosis not present

## 2018-02-24 ENCOUNTER — Other Ambulatory Visit: Payer: Self-pay

## 2018-02-24 ENCOUNTER — Encounter: Payer: Self-pay | Admitting: Physical Therapy

## 2018-02-24 ENCOUNTER — Ambulatory Visit: Payer: Medicare Other | Attending: Obstetrics & Gynecology | Admitting: Physical Therapy

## 2018-02-24 DIAGNOSIS — M6281 Muscle weakness (generalized): Secondary | ICD-10-CM | POA: Diagnosis not present

## 2018-02-24 DIAGNOSIS — M62838 Other muscle spasm: Secondary | ICD-10-CM | POA: Diagnosis not present

## 2018-02-24 DIAGNOSIS — M25551 Pain in right hip: Secondary | ICD-10-CM

## 2018-02-24 NOTE — Therapy (Addendum)
[facility] [address] [place], [place], [address] Phone: [phone]   Fax:  [phone]  Physical Therapy Evaluation  Patient Details  Name: Donna Davis MRN: [id number] Date of Birth: 1970/12/12 Referring Provider: Dr. Arbutus Ped Abu-Alnadi   Encounter Date: 02/24/2018  PT End of Session - 02/24/18 1321    Visit Number  1    Date for PT Re-Evaluation  04/21/18    Authorization Type  Medicare    PT Start Time  6606    PT Stop Time  1230    PT Time Calculation (min)  45 min    Activity Tolerance  Patient tolerated treatment well    Behavior During Therapy  [facility] for tasks assessed/performed     interpreter present during the whole treatment.  Earlie Counts, PT 03/05/18 11:23 AM    Past Medical History:  Diagnosis Date  . Anxiety   . Endometriosis    s/p TAH, LSO  . Family history of malignant neoplasm of gastrointestinal tract   . GERD (gastroesophageal reflux disease)   . Hearing difficulty    b/l, 2/2 congenital rubella s/p stapedectomy. Using hearing aid  . Hepatic cyst    6 mm on CT done done in 05/2005  . Hypothyroidism   . IBS (irritable bowel syndrome)   . Migraines    "frequency depends on my stress level" (07/23/2017)  . PONV (postoperative nausea and vomiting)    "sometimes; depends on how long I'm under"  . Shingles 03/2013    Past Surgical History:  Procedure Laterality Date  . ABDOMINAL ADHESION SURGERY     Enterolysis  . CESAREAN SECTION  1995  . CHOLECYSTECTOMY N/A 06/26/2013   Procedure: LAPAROSCOPIC CHOLECYSTECTOMY WITH ATTEMPTED INTRAOPERATIVE CHOLANGIOGRAM;  Surgeon: Harl Bowie, MD;  Location: [facility];  Service: General;  Laterality: N/A;  . COCHLEAR IMPLANT  03/30/2016  . COCHLEAR IMPLANT Right 03/2015  . COLONOSCOPY WITH PROPOFOL  04/23/2012  . DIAGNOSTIC LAPAROSCOPY  01/14/2006   "I've had several" (07/23/2017)  . ESOPHAGOGASTRODUODENOSCOPY (EGD) WITH PROPOFOL   04/23/2012  . JOINT REPLACEMENT    . KNEE SURGERY Right 2014  . LAPAROSCOPIC OVARIAN CYSTECTOMY Right 04/16/2000  . SALPINGOOPHORECTOMY Left   . STAPEDECTOMY Left 05/14/2006  . Thyroid Nodule removed  1997  . TONSILLECTOMY    . TOTAL ABDOMINAL HYSTERECTOMY  01/13/2004   Endometriosis with residual right ovary. Multiple GYN surgeries for chronic pelvic pain; had LSO in past  . TUBAL LIGATION    . WOUND EXPLORATION Right 10/16/2016   Procedure: RIGHT EXPLORATION OF LACERATION OF INDEX FINGER;  Surgeon: Leanora Cover, MD;  Location: [place];  Service: Orthopedics;  Laterality: Right;    There were no vitals filed for this visit.   Subjective Assessment - 02/24/18 1151    Subjective  Working out at [employer]. Walking at a fast pace on an incline and felt a pain in right lower quadrant. Pain prevents patient from exercising and walking on treadmill. pain started 07/2017    Patient Stated Goals  reduce pain to get back to exercise    Currently in Pain?  Yes    Pain Score  10-Worst pain ever    Pain Location  Abdomen    Pain Orientation  Right    Pain Descriptors / Indicators  Sharp;Dull;Throbbing;Tightness;Constant    Pain Type  Acute pain    Pain Onset  More than a month ago    Pain Frequency  Constant  Aggravating Factors   fast walk, sitting, standing    Pain Relieving Factors  sitting most comfortable    Multiple Pain Sites  No         [facility] PT Assessment - 02/24/18 0001      Assessment   Medical Diagnosis  N94.89 High-tone pelvic floor dysfunction; M25.552 left hip pain     Referring Provider  Dr. Arbutus Ped Abu-Alnadi    Onset Date/Surgical Date  07/18/17    Prior Therapy  sees a chiropractor for neck and low back      Precautions   Precautions  None      Restrictions   Weight Bearing Restrictions  No      Balance Screen   Has the patient fallen in the past 6 months  Yes    How many times?  1    Has the patient had a decrease in activity level  because of a fear of falling?   No just got her coclear implant    Is the patient reluctant to leave their home because of a fear of falling?   No      Home Film/video editor residence      Prior Function   Level of Independence  Independent    Customer service manager    Vocation Requirements  sitting      Cognition   Overall Cognitive Status  Within Functional Limits for tasks assessed      ROM / Strength   AROM / PROM / Strength  AROM;PROM;Strength      AROM   Lumbar Flexion  full with pain in right LQ    Lumbar - Right Side Bend  full with pain    Lumbar - Left Side Bend  full with pulling on right    Lumbar - Right Rotation  full with pulling in right    Lumbar - Left Rotation  full with pain      Strength   Right Hip Flexion  3+/5 pain right quadrant    Right Hip Internal Rotation  4/5 pain in right LQ    Right Hip ABduction  4/5    Right Hip ADduction  3+/5      Palpation   SI assessment   right ilium is rotated anteriorly; sacrum rotated right    Palpation comment  tenderness located on right LQ and mild tenderness lower abdoment, tender on the right iliopsoas, right quadratus, right lumbar paraspinals, right SI joint      Special Tests    Special Tests  Hip Special Tests    Hip Special Tests   Saralyn Pilar (FABER) Test      Saralyn Pilar [facility]) Test   Findings  Positive    Side  Right    Comments  pain                Objective measurements completed on examination: See above findings.    Pelvic Floor Special Questions - 02/24/18 0001    Currently Sexually Active  Yes    Is this Painful  Yes    Marinoff Scale  pain prevents any attempts at intercourse    Urinary Leakage  Yes    Pelvic Floor Internal Exam  Patient confirms identification and approves PT to assess muscle integrity and treatment    Exam Type  Vaginal    Palpation  tenderness located in bil. obturatory internist, levator ani    Strength  fair squeeze, definite lift  PT Short Term Goals - 02/24/18 1329      PT SHORT TERM GOAL #1   Title  independent with intial HEP    Time  4    Period  Weeks    Status  New    Target Date  03/24/18      PT SHORT TERM GOAL #2   Title  understand on how to perform perineal massage to reduce trigger points    Time  4    Period  Weeks    Status  New    Target Date  03/24/18      PT SHORT TERM GOAL #3   Title  abilty to perform daily tasks with pain decreased minimal to no pain due to pelvis in correct alignment    Time  4    Period  Weeks    Status  New    Target Date  03/24/18        PT Long Term Goals - 02/24/18 1224      PT LONG TERM GOAL #1   Title  independent with HEP and understand how to progress herself    Time  8    Period  Weeks    Status  New    Target Date  04/21/18      PT LONG TERM GOAL #2   Title  ability to have intercourse without pain preventing her from </= 1/3    Time  8    Period  Weeks    Status  New    Target Date  04/21/18      PT LONG TERM GOAL #3   Title  abiltiy to walk at fast pace on the treadmill with pain decreased to minimal due to pelvis in correct     Time  8    Period  Weeks    Status  New    Target Date  04/21/18      PT LONG TERM GOAL #4   Title  ability to sit for 45 minutes with minimal to no discomfort and wieght shift    Time  8    Period  Weeks    Status  New    Target Date  04/21/18      PT LONG TERM GOAL #5   Title  lay in bed in supine or right sidely with minimal to no pain due to reduction in pelvic floor trigger points    Time  8    Period  Weeks    Status  New    Target Date  04/21/18             Plan - 02/24/18 1322    Clinical Impression Statement  Patient is a 47 year old female with right lower abdominal pain for the past year.  Patient reports at the time she would exercise on the treadmill with a fast walk.  Patient reports her  constant pain is 10/10 especially with right hip flexion, trunk  flexion, sitting, standing and walking.  Patient has weakness in the right hip and right ilium is rotated anteriorly.  Patient has positive FABER test on the right with pain.  Patient has pain in right lower abdominal area with trunk motion. Palpable tenderness located in righ tilipsoas, obturator internist, levator ani, and quadratus lumborum.  Pelvic floor strength is 3/5 with decreased mobility on the right.  Patient is deaf and had an interpretor.  Patient is not able to have intercourse due to pain.  Patient  will benefit from skilled therapy for manaul skills to reduce trigger points and improve overal mobility and strength.     History and Personal Factors relevant to plan of care:  deaf; endometriosis; hypothyroid    Clinical Presentation  Evolving    Clinical Presentation due to:  patient is not able to drive due to pain or perform any daily tasks    Clinical Decision Making  Moderate    Rehab Potential  Excellent    Clinical Impairments Affecting Rehab Potential  deaf; endometriosis; hypothyroid    PT Frequency  2x / week    PT Duration  8 weeks    PT Treatment/Interventions  Biofeedback;Cryotherapy;Electrical Stimulation;Moist Heat;Ultrasound;Therapeutic exercise;Therapeutic activities;Neuromuscular re-education;Patient/family education;Manual techniques;Passive range of motion;Dry needling    PT Next Visit Plan  dry needling; soft tissue work, abdominal massage, hip stretches; diaphragmatic breathing    Consulted and Agree with Plan of Care  Patient       Patient will benefit from skilled therapeutic intervention in order to improve the following deficits and impairments:  Increased fascial restricitons, Pain, Decreased mobility, Increased muscle spasms, Decreased range of motion, Decreased strength  Visit Diagnosis: Other muscle spasm - Plan: PT plan of care cert/re-cert  Muscle weakness (generalized) - Plan: PT plan of care cert/re-cert  Pain in right hip - Plan: PT plan of care  cert/re-cert     Problem List Patient Active Problem List   Diagnosis Date Noted  . Viral upper respiratory infection 10/11/2017  . RLQ abdominal pain 07/23/2017  . Abdominal pain 07/23/2017  . Acute ear pain, right 06/13/2017  . Rash, skin 04/04/2017  . Allergic rhinitis due to allergen 12/19/2016  . Cochlear implant in place 12/05/2016  . Popliteal cyst, right 08/27/2016  . Bilateral hearing loss 11/10/2015  . Cellulitis, face 10/06/2015  . Bruise of breast 08/26/2015  . Family history of breast cancer in mother 08/26/2015  . Obesity (BMI 30-39.9) 02/18/2014  . Abdominal pain, chronic, right lower quadrant 10/29/2013  . Amitriptyline adverse reaction 04/08/2013  . Migraine headache 12/02/2012  . Routine adult health maintenance 09/01/2012  . Anxiety state 09/03/2006  . Hypothyroidism 07/01/2006  . GERD 07/01/2006  . IBS 07/01/2006    Earlie Counts, PT 02/24/18 1:33 PM   [facility] [address] [place], [place], [address] Phone: [phone]   Fax:  [phone]  Name: Donna Davis MRN: [id number] Date of Birth: 1970-12-27

## 2018-02-25 ENCOUNTER — Encounter: Payer: Self-pay | Admitting: Internal Medicine

## 2018-02-25 DIAGNOSIS — K219 Gastro-esophageal reflux disease without esophagitis: Secondary | ICD-10-CM

## 2018-02-27 ENCOUNTER — Encounter: Payer: Medicare Other | Admitting: Physical Therapy

## 2018-02-27 MED ORDER — OMEPRAZOLE 20 MG PO CPDR: 20.0000 mg | DELAYED_RELEASE_CAPSULE | Freq: Every day | ORAL | 1 refills | Status: DC

## 2018-03-03 ENCOUNTER — Encounter: Payer: Self-pay | Admitting: Physical Therapy

## 2018-03-03 ENCOUNTER — Ambulatory Visit: Payer: Medicare Other | Admitting: Physical Therapy

## 2018-03-03 DIAGNOSIS — M6281 Muscle weakness (generalized): Secondary | ICD-10-CM | POA: Diagnosis not present

## 2018-03-03 DIAGNOSIS — M25551 Pain in right hip: Secondary | ICD-10-CM | POA: Diagnosis not present

## 2018-03-03 DIAGNOSIS — M62838 Other muscle spasm: Secondary | ICD-10-CM | POA: Diagnosis not present

## 2018-03-03 NOTE — Therapy (Addendum)
[facility] [address], [place], [place], [address] Phone: [phone]   Fax:  [phone]  Physical Therapy Treatment  Patient Details  Name: Donna Davis MRN: [id number] Date of Birth: 1970/12/21 Referring Provider: Dr. Arbutus Ped Abu-Alnadi   Encounter Date: 03/03/2018  PT End of Session - 03/03/18 1208    Visit Number  2    Date for PT Re-Evaluation  04/21/18    Authorization Type  Medicare    PT Start Time  1110    PT Stop Time  1200    PT Time Calculation (min)  50 min    Activity Tolerance  Patient tolerated treatment well    Behavior During Therapy  [facility] for tasks assessed/performed     Interpreter present during the whole treatment.  Earlie Counts, PT 03/05/18 11:21 AM    Past Medical History:  Diagnosis Date  . Anxiety   . Endometriosis    s/p TAH, LSO  . Family history of malignant neoplasm of gastrointestinal tract   . GERD (gastroesophageal reflux disease)   . Hearing difficulty    b/l, 2/2 congenital rubella s/p stapedectomy. Using hearing aid  . Hepatic cyst    6 mm on CT done done in 05/2005  . Hypothyroidism   . IBS (irritable bowel syndrome)   . Migraines    "frequency depends on my stress level" (07/23/2017)  . PONV (postoperative nausea and vomiting)    "sometimes; depends on how long I'm under"  . Shingles 03/2013    Past Surgical History:  Procedure Laterality Date  . ABDOMINAL ADHESION SURGERY     Enterolysis  . CESAREAN SECTION  1995  . CHOLECYSTECTOMY N/A 06/26/2013   Procedure: LAPAROSCOPIC CHOLECYSTECTOMY WITH ATTEMPTED INTRAOPERATIVE CHOLANGIOGRAM;  Surgeon: Harl Bowie, MD;  Location: [facility];  Service: General;  Laterality: N/A;  . COCHLEAR IMPLANT  03/30/2016  . COCHLEAR IMPLANT Right 03/2015  . COLONOSCOPY WITH PROPOFOL  04/23/2012  . DIAGNOSTIC LAPAROSCOPY  01/14/2006   "I've had several" (07/23/2017)  . ESOPHAGOGASTRODUODENOSCOPY (EGD) WITH PROPOFOL   04/23/2012  . JOINT REPLACEMENT    . KNEE SURGERY Right 2014  . LAPAROSCOPIC OVARIAN CYSTECTOMY Right 04/16/2000  . SALPINGOOPHORECTOMY Left   . STAPEDECTOMY Left 05/14/2006  . Thyroid Nodule removed  1997  . TONSILLECTOMY    . TOTAL ABDOMINAL HYSTERECTOMY  01/13/2004   Endometriosis with residual right ovary. Multiple GYN surgeries for chronic pelvic pain; had LSO in past  . TUBAL LIGATION    . WOUND EXPLORATION Right 10/16/2016   Procedure: RIGHT EXPLORATION OF LACERATION OF INDEX FINGER;  Surgeon: Leanora Cover, MD;  Location: [place];  Service: Orthopedics;  Laterality: Right;    There were no vitals filed for this visit.  Subjective Assessment - 03/03/18 1110    Subjective  Pain is same today. I took the pain medication today.     Patient Stated Goals  reduce pain to get back to exercise    Currently in Pain?  Yes    Pain Score  8     Pain Location  Abdomen    Pain Orientation  Right    Pain Descriptors / Indicators  Dull;Sharp;Throbbing;Tightness;Constant    Pain Type  Acute pain    Pain Onset  More than a month ago    Pain Frequency  Constant    Aggravating Factors   fast walk, sitting, standing    Pain Relieving Factors  sitting most comfortable    Multiple  Pain Sites  No                       [facility] Adult PT Treatment/Exercise - 03/03/18 0001      Lumbar Exercises: Stretches   Active Hamstring Stretch  Right;2 reps;30 seconds supine    Hip Flexor Stretch  Right;3 reps;30 seconds supine    Other Lumbar Stretch Exercise  butterfly stretch in supine      Knee/Hip Exercises: Prone   Hip Extension  AAROM;Right;1 set;10 reps with knee bent      Manual Therapy   Manual Therapy  Soft tissue mobilization;Joint mobilization;Muscle Energy Technique    Joint Mobilization  anterior glide, distration, and inferior glide of right hip; gapping of the right L3-L5    Soft tissue mobilization  right quadricep; right TFL, right ITB, right obturator  internist, levator ani, and quads    Muscle Energy Technique  correct anteriorly rotated right ilium       Trigger Point Dry Needling - 03/03/18 1203    Consent Given?  Yes    Education Handout Provided  Yes    Muscles Treated Upper Body  Iliopsoas right with needle going in 2 cm then patient did not want it           PT Education - 03/03/18 1207    Education Details  information on dry needling; hamstring stretch, butterfly stretch, quad stretch    Person(s) Educated  Patient    Methods  Demonstration;Explanation;Verbal cues;Handout    Comprehension  Returned demonstration;Verbalized understanding       PT Short Term Goals - 02/24/18 1329      PT SHORT TERM GOAL #1   Title  independent with intial HEP    Time  4    Period  Weeks    Status  New    Target Date  03/24/18      PT SHORT TERM GOAL #2   Title  understand on how to perform perineal massage to reduce trigger points    Time  4    Period  Weeks    Status  New    Target Date  03/24/18      PT SHORT TERM GOAL #3   Title  abilty to perform daily tasks with pain decreased minimal to no pain due to pelvis in correct alignment    Time  4    Period  Weeks    Status  New    Target Date  03/24/18        PT Long Term Goals - 02/24/18 1224      PT LONG TERM GOAL #1   Title  independent with HEP and understand how to progress herself    Time  8    Period  Weeks    Status  New    Target Date  04/21/18      PT LONG TERM GOAL #2   Title  ability to have intercourse without pain preventing her from </= 1/3    Time  8    Period  Weeks    Status  New    Target Date  04/21/18      PT LONG TERM GOAL #3   Title  abiltiy to walk at fast pace on the treadmill with pain decreased to minimal due to pelvis in correct     Time  8    Period  Weeks    Status  New    Target Date  04/21/18      PT LONG TERM GOAL #4   Title  ability to sit for 45 minutes with minimal to no discomfort and wieght shift    Time  8     Period  Weeks    Status  New    Target Date  04/21/18      PT LONG TERM GOAL #5   Title  lay in bed in supine or right sidely with minimal to no pain due to reduction in pelvic floor trigger points    Time  8    Period  Weeks    Status  New    Target Date  04/21/18            Plan - 03/03/18 1111    Clinical Impression Statement  Patient has not met goals yet due to just starting therapy.  Patient did not respond well to dry needling due to pain so it was stopped.  Patient is not a candidate for dry needling. Pelvis was in correct alignment after therapy. Patient was instructed to start 10-15 minutes of walking on the treadmill as long as she has no pain.  After therapy pain decreased to 5/10.  Patient will benefit from skilled therapy for manual skills to reduce trigger points and improve overall mobility and strength.     Rehab Potential  Excellent    Clinical Impairments Affecting Rehab Potential  deaf; endometriosis; hypothyroid    PT Frequency  2x / week    PT Duration  8 weeks    PT Treatment/Interventions  Biofeedback;Cryotherapy;Electrical Stimulation;Moist Heat;Ultrasound;Therapeutic exercise;Therapeutic activities;Neuromuscular re-education;Patient/family education;Manual techniques;Passive range of motion;Dry needling    PT Next Visit Plan   soft tissue work, abdominal massage, diaphragmatic breathing; no dry needling; perineal massage    Consulted and Agree with Plan of Care  Patient       Patient will benefit from skilled therapeutic intervention in order to improve the following deficits and impairments:  Increased fascial restricitons, Pain, Decreased mobility, Increased muscle spasms, Decreased range of motion, Decreased strength  Visit Diagnosis: Other muscle spasm  Muscle weakness (generalized)  Pain in right hip     Problem List Patient Active Problem List   Diagnosis Date Noted  . Viral upper respiratory infection 10/11/2017  . RLQ abdominal pain  07/23/2017  . Abdominal pain 07/23/2017  . Acute ear pain, right 06/13/2017  . Rash, skin 04/04/2017  . Allergic rhinitis due to allergen 12/19/2016  . Cochlear implant in place 12/05/2016  . Popliteal cyst, right 08/27/2016  . Bilateral hearing loss 11/10/2015  . Cellulitis, face 10/06/2015  . Bruise of breast 08/26/2015  . Family history of breast cancer in mother 08/26/2015  . Obesity (BMI 30-39.9) 02/18/2014  . Abdominal pain, chronic, right lower quadrant 10/29/2013  . Amitriptyline adverse reaction 04/08/2013  . Migraine headache 12/02/2012  . Routine adult health maintenance 09/01/2012  . Anxiety state 09/03/2006  . Hypothyroidism 07/01/2006  . GERD 07/01/2006  . IBS 07/01/2006    Earlie Counts, PT 03/03/18 12:15 PM    [facility] [address], [place], [place], [address] Phone: [phone]   Fax:  [phone]  Name: Donna Davis MRN: [id number] Date of Birth: [birth year]

## 2018-03-03 NOTE — Patient Instructions (Signed)

## 2018-03-04 DIAGNOSIS — H9193 Unspecified hearing loss, bilateral: Secondary | ICD-10-CM | POA: Diagnosis not present

## 2018-03-04 DIAGNOSIS — H838X2 Other specified diseases of left inner ear: Secondary | ICD-10-CM | POA: Diagnosis not present

## 2018-03-04 DIAGNOSIS — Z4889 Encounter for other specified surgical aftercare: Secondary | ICD-10-CM | POA: Diagnosis not present

## 2018-03-05 ENCOUNTER — Encounter: Payer: Self-pay | Admitting: Physical Therapy

## 2018-03-05 ENCOUNTER — Ambulatory Visit: Payer: Medicare Other | Admitting: Physical Therapy

## 2018-03-05 ENCOUNTER — Other Ambulatory Visit: Payer: Self-pay | Admitting: Student in an Organized Health Care Education/Training Program

## 2018-03-05 DIAGNOSIS — M62838 Other muscle spasm: Secondary | ICD-10-CM

## 2018-03-05 DIAGNOSIS — M6281 Muscle weakness (generalized): Secondary | ICD-10-CM | POA: Diagnosis not present

## 2018-03-05 DIAGNOSIS — M25551 Pain in right hip: Secondary | ICD-10-CM | POA: Diagnosis not present

## 2018-03-05 NOTE — Patient Instructions (Signed)
Access Code: [email or web address]  URL: [email or web address]  Date: 03/05/2018  Prepared by: Earlie Counts   Exercises  Supine Butterfly Groin Stretch - 2 reps - 1 sets - 30 hold - 1x daily - 7x weekly  Seated Hamstring Stretch - 2 reps - 1 sets - 30 hold - 1x daily - 7x weekly  Seated Piriformis Stretch with Trunk Bend - 2 reps - 1 sets - 30 hold - 1x daily - 7x weekly  Seated Hip Adductor Stretch - 2 reps - 1 sets - 30 hold - 1x daily - 7x weekly  Modified Thomas Stretch - 2 reps - 1 sets - 30 hold - 1x daily - 7x weekly  [facility] [address] [place], [place] [address] Phone # [phone] Fax [phone]

## 2018-03-05 NOTE — Therapy (Signed)
[facility] [address] [place], [place], [address] Phone: [phone]   Fax:  [phone]  Physical Therapy Treatment  Patient Details  Name: Donna Davis MRN: [id number] Date of Birth: [birth year] Referring Provider: Dr. Arbutus Ped Abu-Alnadi   Encounter Date: 03/05/2018  PT End of Session - 03/05/18 0928    Visit Number  3    Date for PT Re-Evaluation  04/21/18    Authorization Type  Medicare    PT Start Time  0900 came late    PT Stop Time  0930    PT Time Calculation (min)  30 min    Activity Tolerance  Patient tolerated treatment well    Behavior During Therapy  [facility] for tasks assessed/performed       Past Medical History:  Diagnosis Date  . Anxiety   . Endometriosis    s/p TAH, LSO  . Family history of malignant neoplasm of gastrointestinal tract   . GERD (gastroesophageal reflux disease)   . Hearing difficulty    b/l, 2/2 congenital rubella s/p stapedectomy. Using hearing aid  . Hepatic cyst    6 mm on CT done done in 05/2005  . Hypothyroidism   . IBS (irritable bowel syndrome)   . Migraines    "frequency depends on my stress level" (07/23/2017)  . PONV (postoperative nausea and vomiting)    "sometimes; depends on how long I'm under"  . Shingles 03/2013    Past Surgical History:  Procedure Laterality Date  . ABDOMINAL ADHESION SURGERY     Enterolysis  . CESAREAN SECTION  1995  . CHOLECYSTECTOMY N/A 06/26/2013   Procedure: LAPAROSCOPIC CHOLECYSTECTOMY WITH ATTEMPTED INTRAOPERATIVE CHOLANGIOGRAM;  Surgeon: Harl Bowie, MD;  Location: [facility];  Service: General;  Laterality: N/A;  . COCHLEAR IMPLANT  03/30/2016  . COCHLEAR IMPLANT Right 03/2015  . COLONOSCOPY WITH PROPOFOL  04/23/2012  . DIAGNOSTIC LAPAROSCOPY  01/14/2006   "I've had several" (07/23/2017)  . ESOPHAGOGASTRODUODENOSCOPY (EGD) WITH PROPOFOL  04/23/2012  . JOINT REPLACEMENT    . KNEE SURGERY Right 2014  . LAPAROSCOPIC  OVARIAN CYSTECTOMY Right 04/16/2000  . SALPINGOOPHORECTOMY Left   . STAPEDECTOMY Left 05/14/2006  . Thyroid Nodule removed  1997  . TONSILLECTOMY    . TOTAL ABDOMINAL HYSTERECTOMY  01/13/2004   Endometriosis with residual right ovary. Multiple GYN surgeries for chronic pelvic pain; had LSO in past  . TUBAL LIGATION    . WOUND EXPLORATION Right 10/16/2016   Procedure: RIGHT EXPLORATION OF LACERATION OF INDEX FINGER;  Surgeon: Leanora Cover, MD;  Location: [place];  Service: Orthopedics;  Laterality: Right;    There were no vitals filed for this visit.  Subjective Assessment - 03/05/18 0858    Subjective  I continue to feel good until I went for a walk and had pain all over again.     Patient is accompained by:  Interpreter    Patient Stated Goals  reduce pain to get back to exercise    Currently in Pain?  Yes    Pain Score  8     Pain Location  Abdomen    Pain Orientation  Right    Pain Descriptors / Indicators  Dull;Sharp;Throbbing;Tightness;Constant    Pain Type  Acute pain    Pain Onset  More than a month ago    Pain Frequency  Constant    Aggravating Factors   fast walk, sitting, standing    Pain Relieving Factors  sitting most comfortable  Multiple Pain Sites  No                    Pelvic Floor Special Questions - 03/05/18 0001    Pelvic Floor Internal Exam  Patient confirms identification and approves PT to assess muscle integrity and treatment    Exam Type  Vaginal    Palpation  too tender for therapist to perform more than 5 mnutes        [facility] Adult PT Treatment/Exercise - 03/05/18 0001      Manual Therapy   Manual Therapy  Soft tissue mobilization;Internal Pelvic Floor    Soft tissue mobilization  right hip adductor, right quads, right hamstring, right groin, right pelvic floor externally, right lower abdominal    Internal Pelvic Floor  right side of sphincter muscle, right obturator internist             PT Education -  03/05/18 1117    Education Details  Access Code: [email or web address]    Person(s) Educated  Patient    Methods  Explanation;Demonstration;Verbal cues;Handout    Comprehension  Returned demonstration;Verbalized understanding       PT Short Term Goals - 02/24/18 1329      PT SHORT TERM GOAL #1   Title  independent with intial HEP    Time  4    Period  Weeks    Status  New    Target Date  03/24/18      PT SHORT TERM GOAL #2   Title  understand on how to perform perineal massage to reduce trigger points    Time  4    Period  Weeks    Status  New    Target Date  03/24/18      PT SHORT TERM GOAL #3   Title  abilty to perform daily tasks with pain decreased minimal to no pain due to pelvis in correct alignment    Time  4    Period  Weeks    Status  New    Target Date  03/24/18        PT Long Term Goals - 02/24/18 1224      PT LONG TERM GOAL #1   Title  independent with HEP and understand how to progress herself    Time  8    Period  Weeks    Status  New    Target Date  04/21/18      PT LONG TERM GOAL #2   Title  ability to have intercourse without pain preventing her from </= 1/3    Time  8    Period  Weeks    Status  New    Target Date  04/21/18      PT LONG TERM GOAL #3   Title  abiltiy to walk at fast pace on the treadmill with pain decreased to minimal due to pelvis in correct     Time  8    Period  Weeks    Status  New    Target Date  04/21/18      PT LONG TERM GOAL #4   Title  ability to sit for 45 minutes with minimal to no discomfort and wieght shift    Time  8    Period  Weeks    Status  New    Target Date  04/21/18      PT LONG TERM GOAL #5   Title  lay in bed in supine or right sidely  with minimal to no pain due to reduction in pelvic floor trigger points    Time  8    Period  Weeks    Status  New    Target Date  04/21/18            Plan - 03/05/18 0900    Clinical Impression Statement  Patient is not able to walk for exercise due to it  increasing her right hip and pelvic pain to 8/10.  Patient had too much pain with internal pelvic floor work therapist had to stop.  Patient pain did not change today after therapy.  Patient will benefit from skilled therapy for manual skills to reduce trigger points and improve overall mobility and strength.     Rehab Potential  Excellent    Clinical Impairments Affecting Rehab Potential  deaf; endometriosis; hypothyroid    PT Frequency  2x / week    PT Duration  8 weeks    PT Treatment/Interventions  Biofeedback;Cryotherapy;Electrical Stimulation;Moist Heat;Ultrasound;Therapeutic exercise;Therapeutic activities;Neuromuscular re-education;Patient/family education;Manual techniques;Passive range of motion;Dry needling    PT Next Visit Plan   soft tissue work, abdominal massage, diaphragmatic breathing; no dry needling; perineal massage    PT Home Exercise Plan  Access Code: [email or web address]    Consulted and Agree with Plan of Care  Patient;Other (Comment) interpreter       Patient will benefit from skilled therapeutic intervention in order to improve the following deficits and impairments:  Increased fascial restricitons, Pain, Decreased mobility, Increased muscle spasms, Decreased range of motion, Decreased strength  Visit Diagnosis: Other muscle spasm  Muscle weakness (generalized)  Pain in right hip     Problem List Patient Active Problem List   Diagnosis Date Noted  . Viral upper respiratory infection 10/11/2017  . RLQ abdominal pain 07/23/2017  . Abdominal pain 07/23/2017  . Acute ear pain, right 06/13/2017  . Rash, skin 04/04/2017  . Allergic rhinitis due to allergen 12/19/2016  . Cochlear implant in place 12/05/2016  . Popliteal cyst, right 08/27/2016  . Bilateral hearing loss 11/10/2015  . Cellulitis, face 10/06/2015  . Bruise of breast 08/26/2015  . Family history of breast cancer in mother 08/26/2015  . Obesity (BMI 30-39.9) 02/18/2014  . Abdominal pain, chronic, right lower  quadrant 10/29/2013  . Amitriptyline adverse reaction 04/08/2013  . Migraine headache 12/02/2012  . Routine adult health maintenance 09/01/2012  . Anxiety state 09/03/2006  . Hypothyroidism 07/01/2006  . GERD 07/01/2006  . IBS 07/01/2006    Earlie Counts, PT 03/05/18 11:19 AM   [facility] [address], [place], [place], [address] Phone: [phone]   Fax:  [phone]  Name: Samika STARLINA LAPRE MRN: [id number] Date of Birth: 04-22-1971

## 2018-03-11 ENCOUNTER — Encounter: Payer: Self-pay | Admitting: Physical Therapy

## 2018-03-11 ENCOUNTER — Ambulatory Visit: Payer: Medicare Other | Admitting: Physical Therapy

## 2018-03-11 DIAGNOSIS — M25551 Pain in right hip: Secondary | ICD-10-CM | POA: Diagnosis not present

## 2018-03-11 DIAGNOSIS — M6281 Muscle weakness (generalized): Secondary | ICD-10-CM

## 2018-03-11 DIAGNOSIS — M62838 Other muscle spasm: Secondary | ICD-10-CM

## 2018-03-11 DIAGNOSIS — R102 Pelvic and perineal pain: Secondary | ICD-10-CM | POA: Diagnosis not present

## 2018-03-11 DIAGNOSIS — M249 Joint derangement, unspecified: Secondary | ICD-10-CM | POA: Diagnosis not present

## 2018-03-11 DIAGNOSIS — N9489 Other specified conditions associated with female genital organs and menstrual cycle: Secondary | ICD-10-CM | POA: Diagnosis not present

## 2018-03-11 NOTE — Therapy (Addendum)
[facility] [address] [place], [place], [address] Phone: [phone]   Fax:  [phone]  Physical Therapy Treatment  Patient Details  Name: Donna Davis MRN: [id number] Date of Birth: [birth year] Referring Provider: Dr. Arbutus Ped Abu-Alnaldi   Encounter Date: 03/11/2018  PT End of Session - 03/11/18 1152    Visit Number  4    Date for PT Re-Evaluation  04/21/18    Authorization Type  Medicare    PT Start Time  1145    PT Stop Time  1225    PT Time Calculation (min)  40 min    Activity Tolerance  Patient tolerated treatment well    Behavior During Therapy  [facility] for tasks assessed/performed       Past Medical History:  Diagnosis Date  . Anxiety   . Endometriosis    s/p TAH, LSO  . Family history of malignant neoplasm of gastrointestinal tract   . GERD (gastroesophageal reflux disease)   . Hearing difficulty    b/l, 2/2 congenital rubella s/p stapedectomy. Using hearing aid  . Hepatic cyst    6 mm on CT done done in 05/2005  . Hypothyroidism   . IBS (irritable bowel syndrome)   . Migraines    "frequency depends on my stress level" (07/23/2017)  . PONV (postoperative nausea and vomiting)    "sometimes; depends on how long I'm under"  . Shingles 03/2013    Past Surgical History:  Procedure Laterality Date  . ABDOMINAL ADHESION SURGERY     Enterolysis  . CESAREAN SECTION  1995  . CHOLECYSTECTOMY N/A 06/26/2013   Procedure: LAPAROSCOPIC CHOLECYSTECTOMY WITH ATTEMPTED INTRAOPERATIVE CHOLANGIOGRAM;  Surgeon: Harl Bowie, MD;  Location: [facility];  Service: General;  Laterality: N/A;  . COCHLEAR IMPLANT  03/30/2016  . COCHLEAR IMPLANT Right 03/2015  . COLONOSCOPY WITH PROPOFOL  04/23/2012  . DIAGNOSTIC LAPAROSCOPY  01/14/2006   "I've had several" (07/23/2017)  . ESOPHAGOGASTRODUODENOSCOPY (EGD) WITH PROPOFOL  04/23/2012  . JOINT REPLACEMENT    . KNEE SURGERY Right 2014  . LAPAROSCOPIC OVARIAN  CYSTECTOMY Right 04/16/2000  . SALPINGOOPHORECTOMY Left   . STAPEDECTOMY Left 05/14/2006  . Thyroid Nodule removed  1997  . TONSILLECTOMY    . TOTAL ABDOMINAL HYSTERECTOMY  01/13/2004   Endometriosis with residual right ovary. Multiple GYN surgeries for chronic pelvic pain; had LSO in past  . TUBAL LIGATION    . WOUND EXPLORATION Right 10/16/2016   Procedure: RIGHT EXPLORATION OF LACERATION OF INDEX FINGER;  Surgeon: Leanora Cover, MD;  Location: [place];  Service: Orthopedics;  Laterality: Right;    There were no vitals filed for this visit.  Subjective Assessment - 03/11/18 1150    Subjective  No changes.     Patient Stated Goals  reduce pain to get back to exercise    Currently in Pain?  Yes    Pain Score  8     Pain Location  Abdomen    Pain Orientation  Right    Pain Descriptors / Indicators  Dull;Sharp;Throbbing;Tightness;Constant    Pain Type  Acute pain    Pain Onset  More than a month ago    Pain Frequency  Constant    Aggravating Factors   fast walk, sitting, standing    Pain Relieving Factors  sitting most comfortable    Multiple Pain Sites  No         [facility] PT Assessment - 03/11/18 0001  Assessment   Medical Diagnosis  N94.89 High-tone pelvic floor dysfunction; M25.552 left hip pain     Referring Provider  Dr. Arbutus Ped Abu-Alnaldi    Onset Date/Surgical Date  07/18/17    Prior Therapy  sees a chiropractor for neck and low back      [place] residence      Prior Function   Level of Independence  Independent    Vocation  Student    Vocation Requirements  sitting      Cognition   Overall Cognitive Status  Within Functional Limits for tasks assessed      Strength   Right Hip Flexion  4+/5    Right Hip Internal Rotation  5/5 pain    Right Hip ABduction  4+/5 pain    Right Hip ADduction  5/5 pain      Palpation   SI assessment   Pelvis in correct alignment      Special Tests    Special  Tests  Hip Special Tests    Hip Special Tests   Saralyn Pilar [facility]) Test      Saralyn Pilar [facility]) Test   Findings  Positive    Side  Right    Comments  pain                   [facility] Adult PT Treatment/Exercise - 03/11/18 0001      Manual Therapy   Manual Therapy  Joint mobilization;Soft tissue mobilization    Joint Mobilization  right hip using the mulligan belt with grade 3  for inferior glide, distraction, lateral glide; gapping of right side of L1-S1;     Soft tissue mobilization  right hip muscles, right hip adductors, right groin, right quads, right lumbar paraspinals               PT Short Term Goals - 03/11/18 1230      PT SHORT TERM GOAL #1   Title  independent with intial HEP    Time  4    Period  Weeks    Status  Achieved      PT SHORT TERM GOAL #2   Title  understand on how to perform perineal massage to reduce trigger points    Time  4    Period  Weeks    Status  On-going      PT SHORT TERM GOAL #3   Title  abilty to perform daily tasks with pain decreased minimal to no pain due to pelvis in correct alignment    Time  4    Period  Weeks    Status  On-going        PT Long Term Goals - 02/24/18 1224      PT LONG TERM GOAL #1   Title  independent with HEP and understand how to progress herself    Time  8    Period  Weeks    Status  New    Target Date  04/21/18      PT LONG TERM GOAL #2   Title  ability to have intercourse without pain preventing her from </= 1/3    Time  8    Period  Weeks    Status  New    Target Date  04/21/18      PT LONG TERM GOAL #3   Title  abiltiy to walk at fast pace on the treadmill with pain decreased to minimal due to pelvis  in correct     Time  8    Period  Weeks    Status  New    Target Date  04/21/18      PT LONG TERM GOAL #4   Title  ability to sit for 45 minutes with minimal to no discomfort and wieght shift    Time  8    Period  Weeks    Status  New    Target Date  04/21/18      PT LONG TERM  GOAL #5   Title  lay in bed in supine or right sidely with minimal to no pain due to reduction in pelvic floor trigger points    Time  8    Period  Weeks    Status  New    Target Date  04/21/18            Plan - 03/11/18 1152    Clinical Impression Statement  Patient has no change in pain.  She has increased right hip motion and strength.  Patient improved lumbar vertebrae mobiity.  Patient has trigger points in right hip adductors, right pelvic floor muscles. and right groin.  Patient is doing her exercises and has incorporated yoga.  Patient has not met goals due to her pain not changing.  Patient will benefit from skilled therapy for manual skills to reduce trigger points and improve overall mobiltiy and strength.     Rehab Potential  Excellent    Clinical Impairments Affecting Rehab Potential  deaf; endometriosis; hypothyroid    PT Frequency  2x / week    PT Duration  8 weeks    PT Treatment/Interventions  Biofeedback;Cryotherapy;Electrical Stimulation;Moist Heat;Ultrasound;Therapeutic exercise;Therapeutic activities;Neuromuscular re-education;Patient/family education;Manual techniques;Passive range of motion;Dry needling    PT Next Visit Plan   soft tissue work, abdominal massage, diaphragmatic breathing; no dry needling; perineal massage; see what MD says    Consulted and Agree with Plan of Care  Patient;Other (Comment)       Patient will benefit from skilled therapeutic intervention in order to improve the following deficits and impairments:  Increased fascial restricitons, Pain, Decreased mobility, Increased muscle spasms, Decreased range of motion, Decreased strength  Visit Diagnosis: Other muscle spasm  Muscle weakness (generalized)  Pain in right hip     Problem List Patient Active Problem List   Diagnosis Date Noted  . Viral upper respiratory infection 10/11/2017  . RLQ abdominal pain 07/23/2017  . Abdominal pain 07/23/2017  . Acute ear pain, right 06/13/2017   . Rash, skin 04/04/2017  . Allergic rhinitis due to allergen 12/19/2016  . Cochlear implant in place 12/05/2016  . Popliteal cyst, right 08/27/2016  . Bilateral hearing loss 11/10/2015  . Cellulitis, face 10/06/2015  . Bruise of breast 08/26/2015  . Family history of breast cancer in mother 08/26/2015  . Obesity (BMI 30-39.9) 02/18/2014  . Abdominal pain, chronic, right lower quadrant 10/29/2013  . Amitriptyline adverse reaction 04/08/2013  . Migraine headache 12/02/2012  . Routine adult health maintenance 09/01/2012  . Anxiety state 09/03/2006  . Hypothyroidism 07/01/2006  . GERD 07/01/2006  . IBS 07/01/2006    Earlie Counts, PT 03/11/18 12:31 PM   [facility] [address] [place], [place], [address] Phone: [phone]   Fax:  [phone]  Name: Donna Davis MRN: [id number] Date of Birth: 1970-11-06  PHYSICAL THERAPY DISCHARGE SUMMARY  Visits from Start of Care: 4  Current functional level related to goals / functional outcomes: See  above. Patient emailed PT on 03/13/2018.  Her appointment with the doctor on 03/12/2018 showed that PT will not help her at this time and would like to be discharged.    Remaining deficits: See above.    Education / Equipment: HEP Plan: Patient agrees to discharge.  Patient goals were not met. Patient is being discharged due to the patient's request.  Thank you for the referral. Earlie Counts, PT 03/14/18 10:30 AM  ?????

## 2018-03-12 ENCOUNTER — Encounter: Payer: Self-pay | Admitting: Physical Therapy

## 2018-03-13 ENCOUNTER — Encounter: Payer: Medicare Other | Admitting: Physical Therapy

## 2018-03-13 DIAGNOSIS — Z9621 Cochlear implant status: Secondary | ICD-10-CM | POA: Diagnosis not present

## 2018-03-13 DIAGNOSIS — H903 Sensorineural hearing loss, bilateral: Secondary | ICD-10-CM | POA: Diagnosis not present

## 2018-03-17 ENCOUNTER — Ambulatory Visit
Admission: RE | Admit: 2018-03-17 | Discharge: 2018-03-17 | Disposition: A | Discharge status: Home / Self Care | Payer: Medicare Other | Source: Ambulatory Visit | Attending: Physical Medicine and Rehabilitation | Admitting: Physical Medicine and Rehabilitation

## 2018-03-17 ENCOUNTER — Other Ambulatory Visit: Payer: Self-pay | Admitting: Physical Medicine and Rehabilitation

## 2018-03-17 DIAGNOSIS — M25551 Pain in right hip: Secondary | ICD-10-CM | POA: Diagnosis not present

## 2018-03-17 DIAGNOSIS — M5031 Other cervical disc degeneration,  high cervical region: Secondary | ICD-10-CM | POA: Diagnosis not present

## 2018-03-17 DIAGNOSIS — M9901 Segmental and somatic dysfunction of cervical region: Secondary | ICD-10-CM | POA: Diagnosis not present

## 2018-03-19 ENCOUNTER — Emergency Department (HOSPITAL_COMMUNITY): Payer: Medicare Other

## 2018-03-19 ENCOUNTER — Encounter (HOSPITAL_COMMUNITY): Payer: Self-pay

## 2018-03-19 ENCOUNTER — Emergency Department (HOSPITAL_COMMUNITY)
Admission: EM | Admit: 2018-03-19 | Discharge: 2018-03-19 | Disposition: A | Discharge status: Home / Self Care | Payer: Medicare Other | Attending: Emergency Medicine | Admitting: Emergency Medicine

## 2018-03-19 ENCOUNTER — Other Ambulatory Visit: Payer: Self-pay

## 2018-03-19 DIAGNOSIS — Z79899 Other long term (current) drug therapy: Secondary | ICD-10-CM | POA: Diagnosis not present

## 2018-03-19 DIAGNOSIS — Z87891 Personal history of nicotine dependence: Secondary | ICD-10-CM | POA: Diagnosis not present

## 2018-03-19 DIAGNOSIS — E039 Hypothyroidism, unspecified: Secondary | ICD-10-CM | POA: Insufficient documentation

## 2018-03-19 DIAGNOSIS — M25551 Pain in right hip: Secondary | ICD-10-CM | POA: Diagnosis not present

## 2018-03-19 MED ORDER — HYDROCODONE-ACETAMINOPHEN 5-325 MG PO TABS: 1.0000 | ORAL_TABLET | Freq: Four times a day (QID) | ORAL | 0 refills | Status: DC | PRN

## 2018-03-19 NOTE — ED Triage Notes (Signed)
Patient complains of ongoing right hip/pelvic pain since November, denies trauma. Pain worse this past week

## 2018-03-19 NOTE — ED Provider Notes (Signed)
[facility] EMERGENCY DEPARTMENT Provider Note   CSN: [id number] Arrival date & time: 03/19/18  1507     History   Chief Complaint Chief Complaint  Patient presents with  . Hip Pain    HPI Donna Davis is a 47 y.o. female.  HPI   47 year old female presents today with complaints of right hip and pelvic pain.  Patient notes a significant past history of chronic abdominal and pelvic pain.  She has a diagnosis of endometriosis.  Patient notes she also has had right hip pain as well.  She has been seen numerous times by OB/GYN, orthopedics with no significant findings.  Most recent visit with plan for outpatient MRI to evaluate hip.  Patient notes earlier in the week she was moving her right leg when she felt severe pain in the right anterior and lateral hip.  She notes pain down the foot, numbness on the sole of the foot, no loss of strength or motor function.  Patient notes this pain is more severe than previous but hence similar location.  No other trauma noted.  No fevers.   Past Medical History:  Diagnosis Date  . Anxiety   . Endometriosis    s/p TAH, LSO  . Family history of malignant neoplasm of gastrointestinal tract   . GERD (gastroesophageal reflux disease)   . Hearing difficulty    b/l, 2/2 congenital rubella s/p stapedectomy. Using hearing aid  . Hepatic cyst    6 mm on CT done done in 05/2005  . Hypothyroidism   . IBS (irritable bowel syndrome)   . Migraines    "frequency depends on my stress level" (07/23/2017)  . PONV (postoperative nausea and vomiting)    "sometimes; depends on how long I'm under"  . Shingles 03/2013    Patient Active Problem List   Diagnosis Date Noted  . Viral upper respiratory infection 10/11/2017  . RLQ abdominal pain 07/23/2017  . Abdominal pain 07/23/2017  . Acute ear pain, right 06/13/2017  . Rash, skin 04/04/2017  . Allergic rhinitis due to allergen 12/19/2016  . Cochlear implant in place 12/05/2016  . Popliteal  cyst, right 08/27/2016  . Bilateral hearing loss 11/10/2015  . Cellulitis, face 10/06/2015  . Bruise of breast 08/26/2015  . Family history of breast cancer in mother 08/26/2015  . Obesity (BMI 30-39.9) 02/18/2014  . Abdominal pain, chronic, right lower quadrant 10/29/2013  . Amitriptyline adverse reaction 04/08/2013  . Migraine headache 12/02/2012  . Routine adult health maintenance 09/01/2012  . Anxiety state 09/03/2006  . Hypothyroidism 07/01/2006  . GERD 07/01/2006  . IBS 07/01/2006    Past Surgical History:  Procedure Laterality Date  . ABDOMINAL ADHESION SURGERY     Enterolysis  . CESAREAN SECTION  1995  . CHOLECYSTECTOMY N/A 06/26/2013   Procedure: LAPAROSCOPIC CHOLECYSTECTOMY WITH ATTEMPTED INTRAOPERATIVE CHOLANGIOGRAM;  Surgeon: Harl Bowie, MD;  Location: [facility];  Service: General;  Laterality: N/A;  . COCHLEAR IMPLANT  03/30/2016  . COCHLEAR IMPLANT Right 03/2015  . COLONOSCOPY WITH PROPOFOL  04/23/2012  . DIAGNOSTIC LAPAROSCOPY  01/14/2006   "I've had several" (07/23/2017)  . ESOPHAGOGASTRODUODENOSCOPY (EGD) WITH PROPOFOL  04/23/2012  . JOINT REPLACEMENT    . KNEE SURGERY Right 2014  . LAPAROSCOPIC OVARIAN CYSTECTOMY Right 04/16/2000  . SALPINGOOPHORECTOMY Left   . STAPEDECTOMY Left 05/14/2006  . Thyroid Nodule removed  1997  . TONSILLECTOMY    . TOTAL ABDOMINAL HYSTERECTOMY  01/13/2004   Endometriosis with residual right ovary. Multiple GYN  surgeries for chronic pelvic pain; had LSO in past  . TUBAL LIGATION    . WOUND EXPLORATION Right 10/16/2016   Procedure: RIGHT EXPLORATION OF LACERATION OF INDEX FINGER;  Surgeon: Leanora Cover, MD;  Location: [facility];  Service: Orthopedics;  Laterality: Right;     OB History    Gravida  3   Para  2   Term  2   Preterm      AB  1   Living  2     SAB      TAB  1   Ectopic      Multiple      Live Births               Home Medications    Prior to Admission medications     Medication Sig Start Date End Date Taking? Authorizing Provider  albuterol (PROVENTIL HFA;VENTOLIN HFA) 108 (90 BASE) MCG/ACT inhaler Inhale into the lungs every 6 (six) hours as needed for wheezing or shortness of breath.    [provider]  ALPRAZolam Duanne Moron) 0.25 MG tablet TAKE 1 TABLET BY MOUTH ONCE DAILY AT BEDTIME AS NEEDED 03/05/18   Sid Falcon, MD  Ascorbic Acid (VITAMIN C PO) Take 1 tablet by mouth daily.    [provider]  busPIRone (BUSPAR) 10 MG tablet Take 5-20 mg by mouth 3 (three) times daily. Takes 1 tab in am, 1/2 tab at lunch ad 1-2 tabs at bedtime    [provider]  estradiol (ESTRACE) 1 MG tablet TAKE ONE TABLET BY MOUTH ONCE DAILY 04/05/17   Anyanwu, Sallyanne Havers, MD  famotidine (PEPCID) 40 MG tablet Take 1 tablet (40 mg total) by mouth 2 (two) times daily as needed for heartburn or indigestion. 08/27/16   Asencion Partridge, MD  fluconazole (DIFLUCAN) 150 MG tablet Take 1 tablet (150 mg total) by mouth every 3 (three) days. For three doses 08/23/17   Anyanwu, Sallyanne Havers, MD  fluticasone (FLONASE) 50 MCG/ACT nasal spray Place 2 sprays into both nostrils daily. Patient not taking: Reported on 02/24/2018 01/09/17   Sid Falcon, MD  gabapentin (NEURONTIN) 300 MG capsule TAKE 1 CAPSULE BY MOUTH THREE TIMES A DAY 01/21/18   Anyanwu, Sallyanne Havers, MD  HYDROcodone-acetaminophen (NORCO/VICODIN) 5-325 MG tablet Take 1 tablet by mouth every 6 (six) hours as needed. 03/19/18   Ozil Stettler, Dellis Filbert, PA-C  ibuprofen (ADVIL,MOTRIN) 800 MG tablet Take 1 tablet (800 mg total) by mouth every 8 (eight) hours as needed. 09/20/16   Melynda Ripple, MD  levothyroxine (SYNTHROID, LEVOTHROID) 25 MCG tablet Take 1 tablet (25 mcg total) by mouth daily. 01/06/18   Oval Linsey, MD  omeprazole (PRILOSEC) 20 MG capsule Take 1 capsule (20 mg total) by mouth daily. 02/27/18 02/27/19  Sid Falcon, MD  traMADol (ULTRAM) 50 MG tablet TAKE 1 TABLET EVERY 6 HOURS AS NEEDED FOR MODERATE-SEVERE PAIN  01/28/18   Anyanwu, Sallyanne Havers, MD    Family History Family History  Problem Relation Age of Onset  . Breast cancer Mother   . Ovarian cancer Mother   . Diabetes Mother   . Pancreatic cancer Father   . Prostate cancer Father   . Colon cancer Father   . Diabetes Father   . Heart disease Father   . Irritable bowel syndrome Father   . Kidney disease Father   . Stomach cancer Maternal Grandmother   . Colon cancer Maternal Grandmother   . Diabetes Maternal Grandmother   . Heart  disease Unknown        both grandmothers  . Bipolar disorder Daughter     Social History Social History   Tobacco Use  . Smoking status: Former Smoker    Packs/day: 1.00    Years: 26.00    Pack years: 26.00    Types: Cigarettes    Last attempt to quit: 02/18/2008    Years since quitting: 10.0  . Smokeless tobacco: Never Used  Substance Use Topics  . Alcohol use: Yes    Comment: 07/23/2017 "glass of wine maybe q 3 months"  . Drug use: No     Allergies   Onion; Other; Shellfish allergy; Amitriptyline; Amoxicillin-pot clavulanate; Amoxicillin-pot clavulanate; Cefuroxime; Cephalexin; Ciprofloxacin; Clarithromycin; Clarithromycin; Doxycycline; Propranolol; Sulfonamide derivatives; Sulfa antibiotics; Sulfasalazine; Benadryl [diphenhydramine hcl (sleep)]; and Shrimp flavor   Review of Systems Review of Systems  All other systems reviewed and are negative.    Physical Exam Updated Vital Signs BP (!) 125/94 (BP Location: Right Arm)   Pulse 99   Temp 98.7 F (37.1 C) (Oral)   Resp 20   LMP 12/26/2012   SpO2 100%   Physical Exam  Constitutional: She is oriented to person, place, and time. She appears well-developed and well-nourished.  HENT:  Head: Normocephalic and atraumatic.  Eyes: Pupils are equal, round, and reactive to light. Conjunctivae are normal. Right eye exhibits no discharge. Left eye exhibits no discharge. No scleral icterus.  Neck: Normal range of motion. No JVD present. No tracheal  deviation present.  Pulmonary/Chest: Effort normal. No stridor.  Abdominal:  Tenderness palpation to the right lower mid abdomen  Musculoskeletal:  Tenderness palpation of the right lateral lumbar musculature, posterior hip, lateral hip, anterior hip-pain with internal/external rotation at the hip pain with flexion extension-distal sensation strength motor function intact  Neurological: She is alert and oriented to person, place, and time. Coordination normal.  Psychiatric: She has a normal mood and affect. Her behavior is normal. Judgment and thought content normal.  Nursing note and vitals reviewed.    ED Treatments / Results  Labs (all labs ordered are listed, but only abnormal results are displayed) Labs Reviewed - No data to display  EKG None  Radiology Dg Hip Unilat  With Pelvis 2-3 Views Right  Result Date: 03/19/2018 CLINICAL DATA:  Acute on chronic right hip pain. EXAM: DG HIP (WITH OR WITHOUT PELVIS) 2-3V RIGHT COMPARISON:  Radiographs of March 17, 2018. FINDINGS: There is no evidence of hip fracture or dislocation. There is no evidence of arthropathy or other focal bone abnormality. IMPRESSION: Normal right hip. Electronically Signed   By: Marijo Conception, M.D.   On: 03/19/2018 15:55    Procedures Procedures (including critical care time)  Medications Ordered in ED Medications - No data to display   Initial Impression / Assessment and Plan / ED Course  I have reviewed the triage vital signs and the nursing notes.  Pertinent labs & imaging results that were available during my care of the patient were reviewed by me and considered in my medical decision making (see chart for details).     47 year old female presents today with hip pain.  Patient notes chronic pain in this area with acute worsening.  She is exquisitely tender to even light palpation.  She has no neurological deficits no need for further imaging at this time given her plain films with lack of neurological  involvement.  Short course of pain medication given, encouraged follow-up with orthopedics for reevaluation.  Her abdominal pain  is baseline no need for evaluation at this time.  Strict return precautions given.  She verbalized understanding and agreement to today's plan.  Final Clinical Impressions(s) / ED Diagnoses   Final diagnoses:  Right hip pain    ED Discharge Orders        Ordered    HYDROcodone-acetaminophen (NORCO/VICODIN) 5-325 MG tablet  Every 6 hours PRN     03/19/18 1713       Okey Regal, PA-C 03/19/18 1929    Little, Wenda Overland, MD 03/21/18 0010

## 2018-03-19 NOTE — Discharge Instructions (Addendum)
Please read attached information. If you experience any new or worsening signs or symptoms please return to the emergency room for evaluation. Please follow-up with your primary care provider or specialist as discussed. Please use medication prescribed only as directed and discontinue taking if you have any concerning signs or symptoms.   °

## 2018-03-19 NOTE — ED Notes (Signed)
Pt verbalized understanding of discharge instructions and denies any further questions at this time.   

## 2018-03-19 NOTE — ED Notes (Signed)
Deaf interpretor called for-patient reads lips as well

## 2018-04-01 DIAGNOSIS — M5031 Other cervical disc degeneration,  high cervical region: Secondary | ICD-10-CM | POA: Diagnosis not present

## 2018-04-01 DIAGNOSIS — F411 Generalized anxiety disorder: Secondary | ICD-10-CM | POA: Diagnosis not present

## 2018-04-01 DIAGNOSIS — M9901 Segmental and somatic dysfunction of cervical region: Secondary | ICD-10-CM | POA: Diagnosis not present

## 2018-04-09 ENCOUNTER — Encounter: Payer: Self-pay | Admitting: Internal Medicine

## 2018-04-09 ENCOUNTER — Ambulatory Visit (INDEPENDENT_AMBULATORY_CARE_PROVIDER_SITE_OTHER): Payer: Medicare Other | Admitting: Internal Medicine

## 2018-04-09 ENCOUNTER — Other Ambulatory Visit: Payer: Self-pay

## 2018-04-09 VITALS — BP 142/75 | HR 80 | Temp 98.7°F | Ht 61.0 in | Wt 210.5 lb

## 2018-04-09 DIAGNOSIS — Z79899 Other long term (current) drug therapy: Secondary | ICD-10-CM

## 2018-04-09 DIAGNOSIS — M549 Dorsalgia, unspecified: Secondary | ICD-10-CM | POA: Diagnosis not present

## 2018-04-09 DIAGNOSIS — G43909 Migraine, unspecified, not intractable, without status migrainosus: Secondary | ICD-10-CM

## 2018-04-09 DIAGNOSIS — Z79891 Long term (current) use of opiate analgesic: Secondary | ICD-10-CM | POA: Diagnosis not present

## 2018-04-09 DIAGNOSIS — K219 Gastro-esophageal reflux disease without esophagitis: Secondary | ICD-10-CM

## 2018-04-09 DIAGNOSIS — N809 Endometriosis, unspecified: Secondary | ICD-10-CM | POA: Diagnosis not present

## 2018-04-09 DIAGNOSIS — R269 Unspecified abnormalities of gait and mobility: Secondary | ICD-10-CM | POA: Diagnosis not present

## 2018-04-09 DIAGNOSIS — E038 Other specified hypothyroidism: Secondary | ICD-10-CM | POA: Diagnosis not present

## 2018-04-09 DIAGNOSIS — E039 Hypothyroidism, unspecified: Secondary | ICD-10-CM | POA: Diagnosis not present

## 2018-04-09 DIAGNOSIS — Z9621 Cochlear implant status: Secondary | ICD-10-CM | POA: Diagnosis not present

## 2018-04-09 DIAGNOSIS — Z87891 Personal history of nicotine dependence: Secondary | ICD-10-CM | POA: Diagnosis not present

## 2018-04-09 DIAGNOSIS — Z79890 Hormone replacement therapy (postmenopausal): Secondary | ICD-10-CM

## 2018-04-09 DIAGNOSIS — M25551 Pain in right hip: Secondary | ICD-10-CM | POA: Diagnosis not present

## 2018-04-09 DIAGNOSIS — G43009 Migraine without aura, not intractable, without status migrainosus: Secondary | ICD-10-CM

## 2018-04-09 DIAGNOSIS — H9193 Unspecified hearing loss, bilateral: Secondary | ICD-10-CM | POA: Diagnosis not present

## 2018-04-09 DIAGNOSIS — F411 Generalized anxiety disorder: Secondary | ICD-10-CM | POA: Diagnosis not present

## 2018-04-09 MED ORDER — HYDROCODONE-ACETAMINOPHEN 5-325 MG PO TABS: 1.0000 | ORAL_TABLET | ORAL | 0 refills | Status: AC | PRN

## 2018-04-09 MED ORDER — OMEPRAZOLE 20 MG PO CPDR: 20.0000 mg | DELAYED_RELEASE_CAPSULE | Freq: Two times a day (BID) | ORAL | 11 refills | Status: DC

## 2018-04-09 MED ORDER — HYDROCODONE-ACETAMINOPHEN 5-325 MG PO TABS: 1.0000 | ORAL_TABLET | ORAL | 0 refills | Status: DC | PRN

## 2018-04-09 NOTE — Assessment & Plan Note (Signed)
She is taking her levothyroxine without issue.  She is due for TSH to evaluate for control.   Plan Check TSH today.

## 2018-04-09 NOTE — Patient Instructions (Signed)
Ms. Beil - -  I am sorry about your hip pain!  I hope the MRI gives some useful information to develop a treatment plan.   I have sent Hydrocodone-apap to the pharmacy for you at [facility].   Please come back to see me in 3-6 months, sooner if you need.

## 2018-04-09 NOTE — Assessment & Plan Note (Signed)
She is doing well on Buspirone and PRN alprazolam.  She has an Rx of valium for prior to her MRI.

## 2018-04-09 NOTE — Assessment & Plan Note (Signed)
She has concomitant neurological symptoms including numbness.  She is being followed by PM&R.  She is currently working and going to [employer] for which she needs some pain management.  To avoid too much advil, will give her an Rx for 3 days worth of hydrocodone-apap to be taken for severe pain, #12 tablets.    Plan MRI on Friday Follow up as planned with PM&R.  Plan for injections possibly depending on findings on MRI.

## 2018-04-09 NOTE — Assessment & Plan Note (Signed)
She is having worsening of symptoms due to advil ingestion.  She is worried to stop the advil given her severe pain and need to be in school.  She knows red flag symptoms include blood in stool, emesis, abdominal pain which she does not have at this time.   Increase omeprazole to BID.  Continue famotidine PRN.  She was advised to limit advil if she could.

## 2018-04-09 NOTE — Progress Notes (Signed)
   Subjective:    Patient ID: Donna Davis, female    DOB: 03-Dec-1970, 47 y.o.   MRN: [id number]  CC: 1 year follow up for hypothyroidism, anxiety   HPI   Donna Davis is a 47yo woman with PMH of bilateral hearing loss with cochlear implants, anxiety, GERD, hypothyroidism who presents for follow up.   She notes that she is being worked up for RLQ abdominal pain, hip pain, numbness of the leg and vagina.  She has seen by PM&R and OB - MRI Friday.  She has endometriosis for which she saw her OB and there was concern of something else going on, so referral to PM&R was made.  Given her symptoms, it certainly could be radicular pain.  She notes that she is taking gabapentin with only minimal relief, tramadol with mild relief and she had hydrocodone which takes the edge off.    Worsening gerd symptoms, once per week with acid taste in mouth, wakes from sleep, some mild improvement with pepcid PRN and tums.  Change to BID dosing, due to advil.  She is aware of red flag symptoms.   Other issues are stable at the moment, her BP is elevated due to pain.  She is due for some blood work and her mammogram which she will schedule after issues with her back/hip are resolved.     Review of Systems  Constitutional: Positive for activity change. Negative for appetite change and unexpected weight change.  Respiratory: Negative for cough and shortness of breath.   Cardiovascular: Negative for chest pain and leg swelling.  Gastrointestinal: Negative for nausea and vomiting.       Indigestion, acid taste in mouth, bad breath  Musculoskeletal: Positive for arthralgias, back pain and gait problem.  Skin: Negative for rash and wound.  Neurological: Positive for numbness. Negative for facial asymmetry.       Objective:   Physical Exam  Constitutional: She is oriented to person, place, and time. She appears well-developed and well-nourished.  She is sitting in an odd position, pain with movement and changing  positions on the right hip  HENT:  Head: Normocephalic and atraumatic.  Cardiovascular: Normal rate and regular rhythm.  No murmur heard. Pulmonary/Chest: Effort normal and breath sounds normal. No respiratory distress.  Abdominal: Soft. Bowel sounds are normal.  Musculoskeletal: She exhibits tenderness. She exhibits no edema.  She has tenderness of the left hip and groin.    Neurological: She is alert and oriented to person, place, and time.  Psychiatric: She has a normal mood and affect. Her behavior is normal.  Vitals reviewed.   BMET (increased advil use), TSH.      Assessment & Plan:  RTC in 3 months, sooner if needed.

## 2018-04-09 NOTE — Assessment & Plan Note (Signed)
Intermittent.  Controlled with tramadol.

## 2018-04-10 LAB — BMP8+ANION GAP
Anion Gap: 17 mmol/L (ref 10.0–18.0)
BUN / CREAT RATIO: 11 (ref 9–23)
BUN: 9 mg/dL (ref 6–24)
CALCIUM: 9.3 mg/dL (ref 8.7–10.2)
CO2: 23 mmol/L (ref 20–29)
CREATININE: 0.81 mg/dL (ref 0.57–1.00)
Chloride: 102 mmol/L (ref 96–106)
GFR calc Af Amer: 99 mL/min/{1.73_m2} (ref >59)
GFR calc non Af Amer: 86 mL/min/{1.73_m2} (ref >59)
Glucose: 84 mg/dL (ref 65–99)
Potassium: 4.7 mmol/L (ref 3.5–5.2)
Sodium: 142 mmol/L (ref 134–144)

## 2018-04-10 LAB — TSH: TSH: 3.13 u[IU]/mL (ref 0.450–4.500)

## 2018-04-11 ENCOUNTER — Other Ambulatory Visit: Payer: Self-pay | Admitting: Obstetrics & Gynecology

## 2018-04-11 DIAGNOSIS — N951 Menopausal and female climacteric states: Secondary | ICD-10-CM

## 2018-04-16 ENCOUNTER — Encounter: Payer: Self-pay | Admitting: Internal Medicine

## 2018-04-17 ENCOUNTER — Other Ambulatory Visit: Payer: Self-pay | Admitting: Physical Medicine and Rehabilitation

## 2018-04-17 DIAGNOSIS — M25551 Pain in right hip: Secondary | ICD-10-CM

## 2018-04-17 DIAGNOSIS — M545 Low back pain: Secondary | ICD-10-CM

## 2018-04-18 ENCOUNTER — Other Ambulatory Visit: Payer: Self-pay | Admitting: Physical Medicine and Rehabilitation

## 2018-04-18 DIAGNOSIS — M25551 Pain in right hip: Secondary | ICD-10-CM

## 2018-04-21 ENCOUNTER — Other Ambulatory Visit: Payer: Self-pay | Admitting: Physical Medicine and Rehabilitation

## 2018-04-21 DIAGNOSIS — M5416 Radiculopathy, lumbar region: Secondary | ICD-10-CM

## 2018-04-21 DIAGNOSIS — M25551 Pain in right hip: Secondary | ICD-10-CM

## 2018-04-21 DIAGNOSIS — M5031 Other cervical disc degeneration,  high cervical region: Secondary | ICD-10-CM | POA: Diagnosis not present

## 2018-04-21 DIAGNOSIS — M9901 Segmental and somatic dysfunction of cervical region: Secondary | ICD-10-CM | POA: Diagnosis not present

## 2018-04-24 ENCOUNTER — Other Ambulatory Visit: Payer: Medicare Other

## 2018-04-24 DIAGNOSIS — M5031 Other cervical disc degeneration,  high cervical region: Secondary | ICD-10-CM | POA: Diagnosis not present

## 2018-04-24 DIAGNOSIS — M9901 Segmental and somatic dysfunction of cervical region: Secondary | ICD-10-CM | POA: Diagnosis not present

## 2018-04-28 DIAGNOSIS — M9901 Segmental and somatic dysfunction of cervical region: Secondary | ICD-10-CM | POA: Diagnosis not present

## 2018-04-28 DIAGNOSIS — M5031 Other cervical disc degeneration,  high cervical region: Secondary | ICD-10-CM | POA: Diagnosis not present

## 2018-05-09 ENCOUNTER — Ambulatory Visit
Admission: RE | Admit: 2018-05-09 | Discharge: 2018-05-09 | Disposition: A | Discharge status: Home / Self Care | Payer: Medicare Other | Source: Ambulatory Visit | Attending: Physical Medicine and Rehabilitation | Admitting: Physical Medicine and Rehabilitation

## 2018-05-09 DIAGNOSIS — M545 Low back pain: Secondary | ICD-10-CM | POA: Diagnosis not present

## 2018-05-09 DIAGNOSIS — M25551 Pain in right hip: Secondary | ICD-10-CM

## 2018-05-09 DIAGNOSIS — M5416 Radiculopathy, lumbar region: Secondary | ICD-10-CM

## 2018-05-09 MED ORDER — IOPAMIDOL (ISOVUE-M 200) INJECTION 41%
10.0000 mL | Freq: Once | INTRAMUSCULAR | Status: AC
  Administered 2018-05-09: 10 mL via INTRA_ARTICULAR

## 2018-05-13 DIAGNOSIS — M25551 Pain in right hip: Secondary | ICD-10-CM | POA: Diagnosis not present

## 2018-05-13 DIAGNOSIS — M5441 Lumbago with sciatica, right side: Secondary | ICD-10-CM | POA: Diagnosis not present

## 2018-05-13 DIAGNOSIS — R102 Pelvic and perineal pain: Secondary | ICD-10-CM | POA: Diagnosis not present

## 2018-05-13 DIAGNOSIS — G8929 Other chronic pain: Secondary | ICD-10-CM | POA: Diagnosis not present

## 2018-05-14 ENCOUNTER — Encounter: Payer: Self-pay | Admitting: Internal Medicine

## 2018-05-20 DIAGNOSIS — M9901 Segmental and somatic dysfunction of cervical region: Secondary | ICD-10-CM | POA: Diagnosis not present

## 2018-05-20 DIAGNOSIS — Z87891 Personal history of nicotine dependence: Secondary | ICD-10-CM | POA: Diagnosis not present

## 2018-05-20 DIAGNOSIS — H903 Sensorineural hearing loss, bilateral: Secondary | ICD-10-CM | POA: Diagnosis not present

## 2018-05-20 DIAGNOSIS — H9193 Unspecified hearing loss, bilateral: Secondary | ICD-10-CM | POA: Diagnosis not present

## 2018-05-20 DIAGNOSIS — M5031 Other cervical disc degeneration,  high cervical region: Secondary | ICD-10-CM | POA: Diagnosis not present

## 2018-05-20 DIAGNOSIS — H838X2 Other specified diseases of left inner ear: Secondary | ICD-10-CM | POA: Diagnosis not present

## 2018-05-21 ENCOUNTER — Encounter: Payer: Self-pay | Admitting: Internal Medicine

## 2018-05-26 ENCOUNTER — Encounter: Payer: Self-pay | Admitting: Internal Medicine

## 2018-05-26 DIAGNOSIS — R102 Pelvic and perineal pain: Secondary | ICD-10-CM

## 2018-05-26 DIAGNOSIS — N809 Endometriosis, unspecified: Secondary | ICD-10-CM

## 2018-05-27 MED ORDER — TRAMADOL HCL 50 MG PO TABS: ORAL_TABLET | ORAL | 3 refills | Status: DC

## 2018-05-27 NOTE — Telephone Encounter (Signed)
Happy to fill for her.  Refill sent electronically.

## 2018-06-05 DIAGNOSIS — G8929 Other chronic pain: Secondary | ICD-10-CM | POA: Diagnosis not present

## 2018-06-05 DIAGNOSIS — M5441 Lumbago with sciatica, right side: Secondary | ICD-10-CM | POA: Diagnosis not present

## 2018-06-05 DIAGNOSIS — M25551 Pain in right hip: Secondary | ICD-10-CM | POA: Diagnosis not present

## 2018-06-09 ENCOUNTER — Encounter: Payer: Self-pay | Admitting: Internal Medicine

## 2018-06-09 DIAGNOSIS — M9903 Segmental and somatic dysfunction of lumbar region: Secondary | ICD-10-CM | POA: Diagnosis not present

## 2018-06-09 DIAGNOSIS — M9905 Segmental and somatic dysfunction of pelvic region: Secondary | ICD-10-CM | POA: Diagnosis not present

## 2018-06-09 DIAGNOSIS — M5136 Other intervertebral disc degeneration, lumbar region: Secondary | ICD-10-CM | POA: Diagnosis not present

## 2018-06-09 DIAGNOSIS — M9904 Segmental and somatic dysfunction of sacral region: Secondary | ICD-10-CM | POA: Diagnosis not present

## 2018-06-10 MED ORDER — ALPRAZOLAM 0.25 MG PO TABS: 0.2500 mg | ORAL_TABLET | Freq: Every evening | ORAL | 2 refills | Status: DC | PRN

## 2018-06-12 ENCOUNTER — Telehealth: Payer: Self-pay | Admitting: *Deleted

## 2018-06-12 ENCOUNTER — Encounter (HOSPITAL_COMMUNITY): Payer: Self-pay | Admitting: Emergency Medicine

## 2018-06-12 ENCOUNTER — Emergency Department (HOSPITAL_COMMUNITY)
Admission: EM | Admit: 2018-06-12 | Discharge: 2018-06-13 | Disposition: A | Discharge status: Home / Self Care | Payer: Medicare Other | Attending: Emergency Medicine | Admitting: Emergency Medicine

## 2018-06-12 DIAGNOSIS — Y939 Activity, unspecified: Secondary | ICD-10-CM | POA: Insufficient documentation

## 2018-06-12 DIAGNOSIS — Y929 Unspecified place or not applicable: Secondary | ICD-10-CM | POA: Insufficient documentation

## 2018-06-12 DIAGNOSIS — M9905 Segmental and somatic dysfunction of pelvic region: Secondary | ICD-10-CM | POA: Diagnosis not present

## 2018-06-12 DIAGNOSIS — S51851A Open bite of right forearm, initial encounter: Secondary | ICD-10-CM | POA: Diagnosis not present

## 2018-06-12 DIAGNOSIS — M9903 Segmental and somatic dysfunction of lumbar region: Secondary | ICD-10-CM | POA: Diagnosis not present

## 2018-06-12 DIAGNOSIS — W5501XA Bitten by cat, initial encounter: Secondary | ICD-10-CM | POA: Insufficient documentation

## 2018-06-12 DIAGNOSIS — M9904 Segmental and somatic dysfunction of sacral region: Secondary | ICD-10-CM | POA: Diagnosis not present

## 2018-06-12 DIAGNOSIS — Y999 Unspecified external cause status: Secondary | ICD-10-CM | POA: Diagnosis not present

## 2018-06-12 DIAGNOSIS — S41151A Open bite of right upper arm, initial encounter: Secondary | ICD-10-CM | POA: Diagnosis not present

## 2018-06-12 DIAGNOSIS — M5136 Other intervertebral disc degeneration, lumbar region: Secondary | ICD-10-CM | POA: Diagnosis not present

## 2018-06-12 MED ORDER — OXYCODONE-ACETAMINOPHEN 5-325 MG PO TABS
2.0000 | ORAL_TABLET | Freq: Once | ORAL | Status: AC
  Administered 2018-06-12: 2 via ORAL
  Filled 2018-06-12: qty 2

## 2018-06-12 MED ORDER — RABIES VACCINE, PCEC IM SUSR
1.0000 mL | Freq: Once | INTRAMUSCULAR | Status: AC
  Administered 2018-06-12: 1 mL via INTRAMUSCULAR
  Filled 2018-06-12 (×2): qty 1

## 2018-06-12 MED ORDER — RABIES IMMUNE GLOBULIN 150 UNIT/ML IM INJ
20.0000 [IU]/kg | INJECTION | Freq: Once | INTRAMUSCULAR | Status: AC
  Administered 2018-06-13: 1800 [IU]
  Filled 2018-06-12 (×2): qty 12

## 2018-06-12 MED ORDER — AMOXICILLIN-POT CLAVULANATE 875-125 MG PO TABS
1.0000 | ORAL_TABLET | Freq: Once | ORAL | Status: AC
  Administered 2018-06-12: 1 via ORAL
  Filled 2018-06-12: qty 1

## 2018-06-12 MED ORDER — HYDROXYZINE HCL 25 MG PO TABS: 25.0000 mg | ORAL_TABLET | Freq: Once | ORAL | Status: DC

## 2018-06-12 NOTE — ED Notes (Signed)
Called pt from [place] time

## 2018-06-12 NOTE — ED Triage Notes (Signed)
Pt presents with cat bite to R arm x 1 wk ago; cat is a stray that she reached down to pet and it attacked her; pt states she was treating it at home with neosporin but now pain worse, feels like fire/burning, radiating up to axilla and neck, reports swollen lymphnodes

## 2018-06-12 NOTE — ED Provider Notes (Signed)
[facility] EMERGENCY DEPARTMENT Provider Note   CSN: [id number] Arrival date & time: 06/12/18  1740     History   Chief Complaint Chief Complaint  Patient presents with  . Animal Bite  . Arm Pain  . Torticollis    HPI Donna Davis is a 47 y.o. female with a past medical history of hearing impairment.  Professional ASL translator used at bedside.  Patient presents for evaluation of a cat bite.  The bite occurred 7 days ago.  There is a feral cat that they have been feeding.  The cat is usually friendly.  She reached down to pet the cat when it attacked her and bit her on the right arm.  She states that over the past 24 hours it has gotten increasingly more warm and exquisitely tender.  She rates the pain as severe.  She denies any numbness or tingling in her fingers.  She is unsure of her last tetanus vaccination.  She has not had the cat quarantine or evaluated for rabies.  HPI  Past Medical History:  Diagnosis Date  . Anxiety   . Endometriosis    s/p TAH, LSO  . Family history of malignant neoplasm of gastrointestinal tract   . GERD (gastroesophageal reflux disease)   . Hearing difficulty    b/l, 2/2 congenital rubella s/p stapedectomy. Using hearing aid  . Hepatic cyst    6 mm on CT done done in 05/2005  . Hypothyroidism   . IBS (irritable bowel syndrome)   . Migraines    "frequency depends on my stress level" (07/23/2017)  . PONV (postoperative nausea and vomiting)    "sometimes; depends on how long I'm under"  . Shingles 03/2013    Patient Active Problem List   Diagnosis Date Noted  . Endometriosis 04/09/2018  . Right hip pain 04/09/2018  . Allergic rhinitis due to allergen 12/19/2016  . Cochlear implant in place 12/05/2016  . Popliteal cyst, right 08/27/2016  . Bilateral deafness 01/17/2016  . Family history of breast cancer in mother 08/26/2015  . Obesity (BMI 30-39.9) 02/18/2014  . Amitriptyline adverse reaction 04/08/2013  . Migraine  headache 12/02/2012  . Routine adult health maintenance 09/01/2012  . Anxiety state 09/03/2006  . Hypothyroidism 07/01/2006  . GERD 07/01/2006  . IBS 07/01/2006    Past Surgical History:  Procedure Laterality Date  . ABDOMINAL ADHESION SURGERY     Enterolysis  . CESAREAN SECTION  1995  . CHOLECYSTECTOMY N/A 06/26/2013   Procedure: LAPAROSCOPIC CHOLECYSTECTOMY WITH ATTEMPTED INTRAOPERATIVE CHOLANGIOGRAM;  Surgeon: Harl Bowie, MD;  Location: [facility];  Service: General;  Laterality: N/A;  . COCHLEAR IMPLANT  03/30/2016  . COCHLEAR IMPLANT Right 03/2015  . COLONOSCOPY WITH PROPOFOL  04/23/2012  . DIAGNOSTIC LAPAROSCOPY  01/14/2006   "I've had several" (07/23/2017)  . ESOPHAGOGASTRODUODENOSCOPY (EGD) WITH PROPOFOL  04/23/2012  . JOINT REPLACEMENT    . KNEE SURGERY Right 2014  . LAPAROSCOPIC OVARIAN CYSTECTOMY Right 04/16/2000  . SALPINGOOPHORECTOMY Left   . STAPEDECTOMY Left 05/14/2006  . Thyroid Nodule removed  1997  . TONSILLECTOMY    . TOTAL ABDOMINAL HYSTERECTOMY  01/13/2004   Endometriosis with residual right ovary. Multiple GYN surgeries for chronic pelvic pain; had LSO in past  . TUBAL LIGATION    . WOUND EXPLORATION Right 10/16/2016   Procedure: RIGHT EXPLORATION OF LACERATION OF INDEX FINGER;  Surgeon: Leanora Cover, MD;  Location: [facility];  Service: Orthopedics;  Laterality: Right;  OB History    Gravida  3   Para  2   Term  2   Preterm      AB  1   Living  2     SAB      TAB  1   Ectopic      Multiple      Live Births               Home Medications    Prior to Admission medications   Medication Sig Start Date End Date Taking? Authorizing Provider  albuterol (PROVENTIL HFA;VENTOLIN HFA) 108 (90 BASE) MCG/ACT inhaler Inhale into the lungs every 6 (six) hours as needed for wheezing or shortness of breath.    [provider]  ALPRAZolam Duanne Moron) 0.25 MG tablet Take 1 tablet (0.25 mg total) by mouth at bedtime as  needed for anxiety. 06/10/18   Sid Falcon, MD  Ascorbic Acid (VITAMIN C PO) Take 1 tablet by mouth daily.    [provider]  betamethasone valerate (VALISONE) 0.1 % cream APPLY 1 APPLICATION ON THE SKIN TWICE DAILY AS NEEDED 03/31/18   [provider]  busPIRone (BUSPAR) 10 MG tablet Take 5-20 mg by mouth 3 (three) times daily. Takes 1 tab in am, 1/2 tab at lunch ad 1-2 tabs at bedtime    [provider]  estradiol (ESTRACE) 1 MG tablet TAKE 1 TABLET BY MOUTH ONCE DAILY 04/11/18   Donnamae Jude, MD  famotidine (PEPCID) 40 MG tablet Take 1 tablet (40 mg total) by mouth 2 (two) times daily as needed for heartburn or indigestion. 08/27/16   Asencion Partridge, MD  fluconazole (DIFLUCAN) 150 MG tablet Take 1 tablet (150 mg total) by mouth every 3 (three) days. For three doses 08/23/17   Anyanwu, Sallyanne Havers, MD  gabapentin (NEURONTIN) 300 MG capsule TAKE 1 CAPSULE BY MOUTH THREE TIMES A DAY 01/21/18   Anyanwu, Sallyanne Havers, MD  ibuprofen (ADVIL,MOTRIN) 800 MG tablet Take 1 tablet (800 mg total) by mouth every 8 (eight) hours as needed. 09/20/16   Melynda Ripple, MD  levothyroxine (SYNTHROID, LEVOTHROID) 25 MCG tablet Take 1 tablet (25 mcg total) by mouth daily. 01/06/18   Oval Linsey, MD  omeprazole (PRILOSEC) 20 MG capsule Take 1 capsule (20 mg total) by mouth 2 (two) times daily before a meal. 04/09/18 04/09/19  Sid Falcon, MD  traMADol (ULTRAM) 50 MG tablet TAKE 1 TABLET EVERY 6 HOURS AS NEEDED FOR MODERATE-SEVERE PAIN 05/27/18   Sid Falcon, MD    Family History Family History  Problem Relation Age of Onset  . Breast cancer Mother   . Ovarian cancer Mother   . Diabetes Mother   . Pancreatic cancer Father   . Prostate cancer Father   . Colon cancer Father   . Diabetes Father   . Heart disease Father   . Irritable bowel syndrome Father   . Kidney disease Father   . Stomach cancer Maternal Grandmother   . Colon cancer Maternal Grandmother   . Diabetes Maternal  Grandmother   . Heart disease Unknown        both grandmothers  . Bipolar disorder Daughter     Social History Social History   Tobacco Use  . Smoking status: Former Smoker    Packs/day: 1.00    Years: 26.00    Pack years: 26.00    Types: Cigarettes    Last attempt to quit: 02/18/2008    Years since quitting: 10.3  .  Smokeless tobacco: Never Used  Substance Use Topics  . Alcohol use: Yes    Comment: Occasionally.  . Drug use: No     Allergies   Onion; Other; Shellfish allergy; Amitriptyline; Amoxicillin-pot clavulanate; Amoxicillin-pot clavulanate; Cefuroxime; Cephalexin; Ciprofloxacin; Clarithromycin; Clarithromycin; Doxycycline; Propranolol; Sulfonamide derivatives; Sulfa antibiotics; Sulfasalazine; Benadryl [diphenhydramine hcl (sleep)]; and Shrimp flavor   Review of Systems Review of Systems Ten systems reviewed and are negative for acute change, except as noted in the HPI.    Physical Exam Updated Vital Signs BP (!) 147/95 (BP Location: Left Arm)   Pulse 85   Temp 98.8 F (37.1 C) (Oral)   Resp 16   Ht 5' (1.524 m)   Wt 91.2 kg   LMP 12/26/2012   SpO2 97%   BMI 39.26 kg/m   Physical Exam  Constitutional: She is oriented to person, place, and time. She appears well-developed and well-nourished. No distress.  HENT:  Head: Normocephalic and atraumatic.  Eyes: Pupils are equal, round, and reactive to light. Conjunctivae and EOM are normal. No scleral icterus.  Neck: Normal range of motion.  Cardiovascular: Normal rate, regular rhythm and normal heart sounds. Exam reveals no gallop and no friction rub.  No murmur heard. Pulmonary/Chest: Effort normal and breath sounds normal. No respiratory distress.  Abdominal: Soft. Bowel sounds are normal. She exhibits no distension and no mass. There is no tenderness. There is no guarding.  Neurological: She is alert and oriented to person, place, and time.  Skin: Skin is warm and dry. She is not diaphoretic.  Puncture  wound to the right lateral arm, erythema and warmth present.  No lymphangitis, no fluctuance.  Exquisitely tender to palpation  Psychiatric: Her behavior is normal.  Nursing note and vitals reviewed.    ED Treatments / Results  Labs (all labs ordered are listed, but only abnormal results are displayed) Labs Reviewed - No data to display  EKG None  Radiology No results found.  Procedures Procedures (including critical care time)  Medications Ordered in ED Medications - No data to display   Initial Impression / Assessment and Plan / ED Course  I have reviewed the triage vital signs and the nursing notes.  Pertinent labs & imaging results that were available during my care of the patient were reviewed by me and considered in my medical decision making (see chart for details).     Patient with cat bite to the right forearm over 1 week ago.  We had a long discussion about rabies vaccination.  I tried to inform the bat the patient the best I could about her risks.  I also showed the patient the algorithm for post exposure rabies prophylaxis.  Patient had an unprovoked cat bite.  I did discuss the fact that the observation period is usually about 10 days but have never had anyone come and see me 7 days after the bite and I am not sure how animal control would handle quarantine at this point.  The patient ultimately decided that she would accept the rabies vaccinations.  The patient pain was addressed here in the emergency department.  She will be started on Augmentin.  Patient states that in the past she has had some difficulty with nausea and vomiting with this medication.  She has multiple drug allergies and I discussed that this would be 1 of the only regimens that would not combine to medications which she is also allergic.  Patient will try to tolerate the medicines and follow-up with her  PCP if need be.  Patient is otherwise hemodynamically stable and afebrile.  She has been given a  schedule for follow-up rabies vaccinations and appears appropriate for discharge at this time  Final Clinical Impressions(s) / ED Diagnoses   Final diagnoses:  None    ED Discharge Orders    None       Margarita Mail, PA-C 06/13/18 1702    Daleen Bo, MD 06/15/18 2136

## 2018-06-12 NOTE — Telephone Encounter (Signed)
Agree with plan 

## 2018-06-12 NOTE — Telephone Encounter (Signed)
Cal from pt via sign language interpreter - stated she was bitten by a cat recently; now the site is red and tingling. Told pt she needs an appt to be seen - here or UC/ED. No available appts until tomorrow. She stated she cannot wait until tomorrow; she will go to the ED now.

## 2018-06-12 NOTE — ED Notes (Signed)
Prior to giving medications, patient requested the VRI and asked that we call an interpreter to the hospital because she says that is what should be done.

## 2018-06-12 NOTE — ED Notes (Signed)
Family at bedside. 

## 2018-06-13 MED ORDER — AMOXICILLIN-POT CLAVULANATE 875-125 MG PO TABS: 1.0000 | ORAL_TABLET | Freq: Two times a day (BID) | ORAL | 0 refills | Status: DC

## 2018-06-13 MED ORDER — HYDROCODONE-ACETAMINOPHEN 5-325 MG PO TABS: 2.0000 | ORAL_TABLET | ORAL | 0 refills | Status: DC | PRN

## 2018-06-13 NOTE — Discharge Instructions (Addendum)
Get help right away if: You have a red streak extending away from your wound. You have fluid, blood, or pus coming from your wound. You have a fever or chills. You have trouble moving your injured area. You have numbness or tingling extending beyond the wound.

## 2018-06-15 ENCOUNTER — Ambulatory Visit (HOSPITAL_COMMUNITY)
Admission: EM | Admit: 2018-06-15 | Discharge: 2018-06-15 | Disposition: A | Discharge status: Home / Self Care | Payer: Medicare Other | Attending: Internal Medicine | Admitting: Internal Medicine

## 2018-06-15 DIAGNOSIS — Z23 Encounter for immunization: Secondary | ICD-10-CM | POA: Diagnosis not present

## 2018-06-15 DIAGNOSIS — Z203 Contact with and (suspected) exposure to rabies: Secondary | ICD-10-CM

## 2018-06-15 MED ORDER — RABIES VACCINE, PCEC IM SUSR
INTRAMUSCULAR | Status: AC
  Filled 2018-06-15: qty 1

## 2018-06-15 MED ORDER — RABIES VACCINE, PCEC IM SUSR
1.0000 mL | Freq: Once | INTRAMUSCULAR | Status: AC
  Administered 2018-06-15: 1 mL via INTRAMUSCULAR

## 2018-06-15 NOTE — ED Notes (Signed)
Pt here for day 3 rabies shot.

## 2018-06-17 ENCOUNTER — Other Ambulatory Visit: Payer: Self-pay | Admitting: Physical Medicine and Rehabilitation

## 2018-06-17 ENCOUNTER — Ambulatory Visit
Admission: RE | Admit: 2018-06-17 | Discharge: 2018-06-17 | Disposition: A | Discharge status: Home / Self Care | Payer: Medicare Other | Source: Ambulatory Visit | Attending: Physical Medicine and Rehabilitation | Admitting: Physical Medicine and Rehabilitation

## 2018-06-17 DIAGNOSIS — M5441 Lumbago with sciatica, right side: Secondary | ICD-10-CM

## 2018-06-17 DIAGNOSIS — M4186 Other forms of scoliosis, lumbar region: Secondary | ICD-10-CM | POA: Diagnosis not present

## 2018-06-17 DIAGNOSIS — M47816 Spondylosis without myelopathy or radiculopathy, lumbar region: Secondary | ICD-10-CM | POA: Diagnosis not present

## 2018-06-19 DIAGNOSIS — Z79891 Long term (current) use of opiate analgesic: Secondary | ICD-10-CM | POA: Diagnosis not present

## 2018-06-19 DIAGNOSIS — Z2914 Encounter for prophylactic rabies immune globin: Secondary | ICD-10-CM | POA: Diagnosis not present

## 2018-06-19 DIAGNOSIS — Z79899 Other long term (current) drug therapy: Secondary | ICD-10-CM | POA: Diagnosis not present

## 2018-06-19 DIAGNOSIS — Z881 Allergy status to other antibiotic agents status: Secondary | ICD-10-CM | POA: Diagnosis not present

## 2018-06-19 DIAGNOSIS — Z882 Allergy status to sulfonamides status: Secondary | ICD-10-CM | POA: Diagnosis not present

## 2018-06-19 DIAGNOSIS — T148XXA Other injury of unspecified body region, initial encounter: Secondary | ICD-10-CM | POA: Diagnosis not present

## 2018-06-20 ENCOUNTER — Encounter: Payer: Self-pay | Admitting: Internal Medicine

## 2018-06-23 ENCOUNTER — Other Ambulatory Visit: Payer: Self-pay | Admitting: Internal Medicine

## 2018-06-23 DIAGNOSIS — G8929 Other chronic pain: Secondary | ICD-10-CM | POA: Diagnosis not present

## 2018-06-23 DIAGNOSIS — M9905 Segmental and somatic dysfunction of pelvic region: Secondary | ICD-10-CM | POA: Diagnosis not present

## 2018-06-23 DIAGNOSIS — M9903 Segmental and somatic dysfunction of lumbar region: Secondary | ICD-10-CM | POA: Diagnosis not present

## 2018-06-23 DIAGNOSIS — M5136 Other intervertebral disc degeneration, lumbar region: Secondary | ICD-10-CM | POA: Diagnosis not present

## 2018-06-23 DIAGNOSIS — M5441 Lumbago with sciatica, right side: Secondary | ICD-10-CM | POA: Diagnosis not present

## 2018-06-23 DIAGNOSIS — M9904 Segmental and somatic dysfunction of sacral region: Secondary | ICD-10-CM | POA: Diagnosis not present

## 2018-06-23 DIAGNOSIS — M25551 Pain in right hip: Secondary | ICD-10-CM | POA: Diagnosis not present

## 2018-06-23 MED ORDER — FLUCONAZOLE 150 MG PO TABS: 150.0000 mg | ORAL_TABLET | Freq: Once | ORAL | 0 refills | Status: AC

## 2018-06-23 NOTE — Telephone Encounter (Signed)
I have sent in fluconazole for her. Please inform her that if she is not improving she needs to come to [facility] for follow up

## 2018-06-24 ENCOUNTER — Encounter (HOSPITAL_COMMUNITY): Payer: Self-pay | Admitting: Emergency Medicine

## 2018-06-24 ENCOUNTER — Ambulatory Visit (HOSPITAL_COMMUNITY)
Admission: EM | Admit: 2018-06-24 | Discharge: 2018-06-24 | Disposition: A | Discharge status: Home / Self Care | Payer: Medicare Other | Attending: Family Medicine | Admitting: Family Medicine

## 2018-06-24 DIAGNOSIS — Z203 Contact with and (suspected) exposure to rabies: Secondary | ICD-10-CM | POA: Diagnosis not present

## 2018-06-24 DIAGNOSIS — Z23 Encounter for immunization: Secondary | ICD-10-CM | POA: Diagnosis not present

## 2018-06-24 DIAGNOSIS — M9903 Segmental and somatic dysfunction of lumbar region: Secondary | ICD-10-CM | POA: Diagnosis not present

## 2018-06-24 DIAGNOSIS — M5136 Other intervertebral disc degeneration, lumbar region: Secondary | ICD-10-CM | POA: Diagnosis not present

## 2018-06-24 DIAGNOSIS — M9905 Segmental and somatic dysfunction of pelvic region: Secondary | ICD-10-CM | POA: Diagnosis not present

## 2018-06-24 DIAGNOSIS — M9904 Segmental and somatic dysfunction of sacral region: Secondary | ICD-10-CM | POA: Diagnosis not present

## 2018-06-24 MED ORDER — RABIES VACCINE, PCEC IM SUSR
1.0000 mL | Freq: Once | INTRAMUSCULAR | Status: AC
  Administered 2018-06-24: 1 mL via INTRAMUSCULAR

## 2018-06-24 MED ORDER — RABIES VACCINE, PCEC IM SUSR
INTRAMUSCULAR | Status: AC
  Filled 2018-06-24: qty 1

## 2018-06-24 NOTE — ED Triage Notes (Signed)
Pt here for final rabies injection; given in left arm

## 2018-06-30 DIAGNOSIS — M9905 Segmental and somatic dysfunction of pelvic region: Secondary | ICD-10-CM | POA: Diagnosis not present

## 2018-06-30 DIAGNOSIS — M5136 Other intervertebral disc degeneration, lumbar region: Secondary | ICD-10-CM | POA: Diagnosis not present

## 2018-06-30 DIAGNOSIS — M9904 Segmental and somatic dysfunction of sacral region: Secondary | ICD-10-CM | POA: Diagnosis not present

## 2018-06-30 DIAGNOSIS — M9903 Segmental and somatic dysfunction of lumbar region: Secondary | ICD-10-CM | POA: Diagnosis not present

## 2018-07-10 DIAGNOSIS — M5136 Other intervertebral disc degeneration, lumbar region: Secondary | ICD-10-CM | POA: Diagnosis not present

## 2018-07-10 DIAGNOSIS — M9904 Segmental and somatic dysfunction of sacral region: Secondary | ICD-10-CM | POA: Diagnosis not present

## 2018-07-10 DIAGNOSIS — M9905 Segmental and somatic dysfunction of pelvic region: Secondary | ICD-10-CM | POA: Diagnosis not present

## 2018-07-10 DIAGNOSIS — M9903 Segmental and somatic dysfunction of lumbar region: Secondary | ICD-10-CM | POA: Diagnosis not present

## 2018-07-19 IMAGING — MR MR PELVIS WO/W CM
5 of 10 series · 21 of 48 positions shown · IV contrast (multihance)
Comparison: CT on 07/23/2017

CLINICAL DATA: Right-sided pelvic pain for 4 months. Endometriosis.
Previous hysterectomy.

EXAM:
MRI PELVIS WITHOUT AND WITH CONTRAST
TECHNIQUE: Multiplanar multisequence MR imaging of the pelvis was performed
both before and after administration of intravenous contrast.
CONTRAST:  15mL MULTIHANCE GADOBENATE DIMEGLUMINE 529 MG/ML IV SOLN

[Series 3: T2 · coronal · 5.0mm · 0.78mm/px · 4 of 27 slices shown]
[im 1/27]
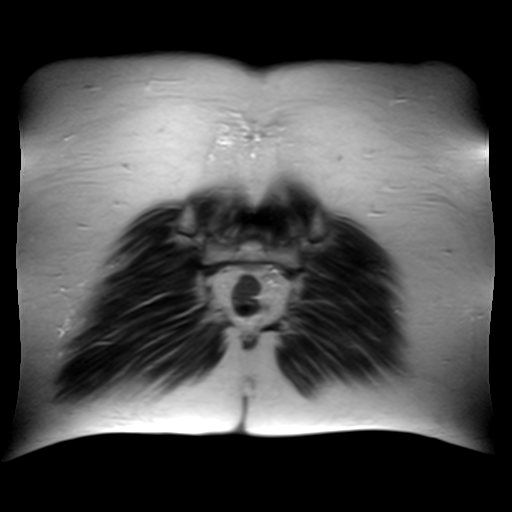
[im 9/27]
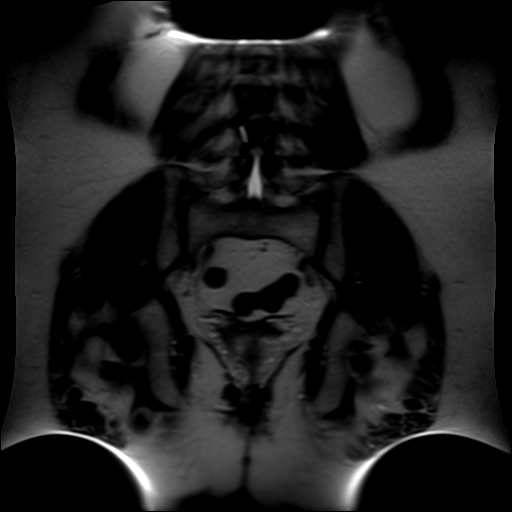
[im 18/27]
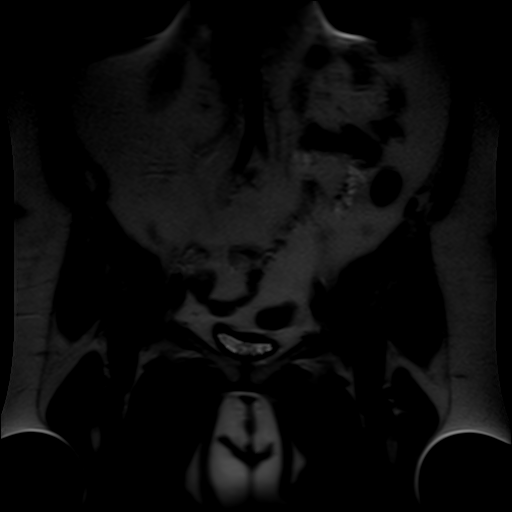
[im 27/27]
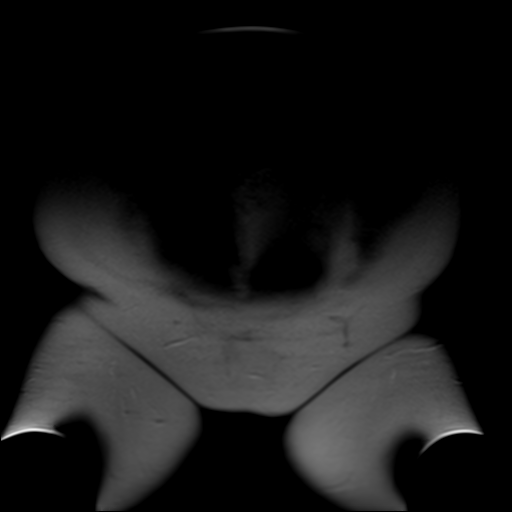

[Series 4: t2_tse_sag · sagittal · 5.0mm · 0.65mm/px · 4 of 25 slices shown]
[im 1/25]
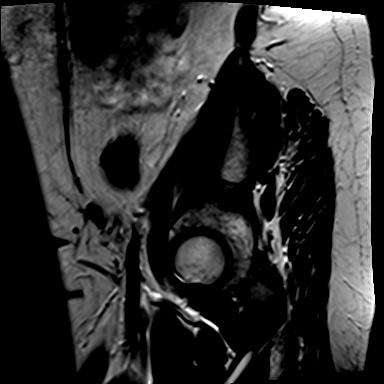
[im 9/25]
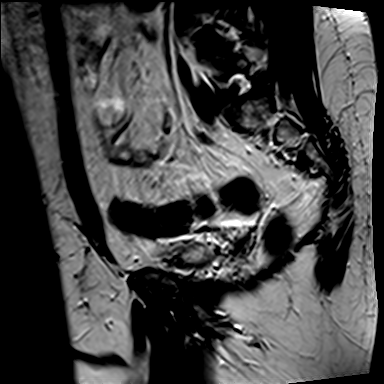
[im 17/25]
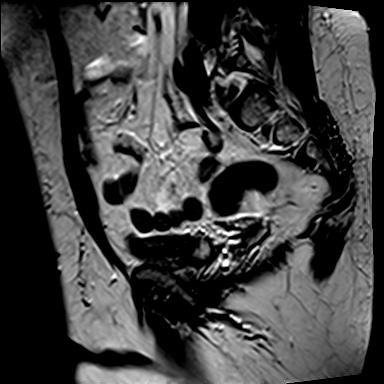
[im 25/25]
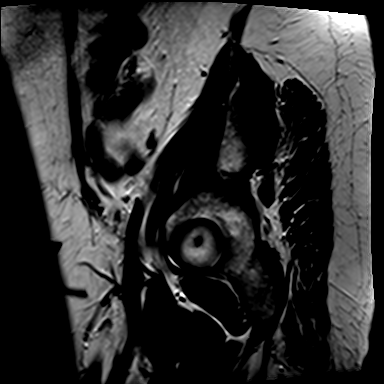

[Series 5: t2_tse axial · axial · 5.0mm · 0.51mm/px · z∈[-27,+136]mm · 5 of 27 slices shown]
[im 1/27]
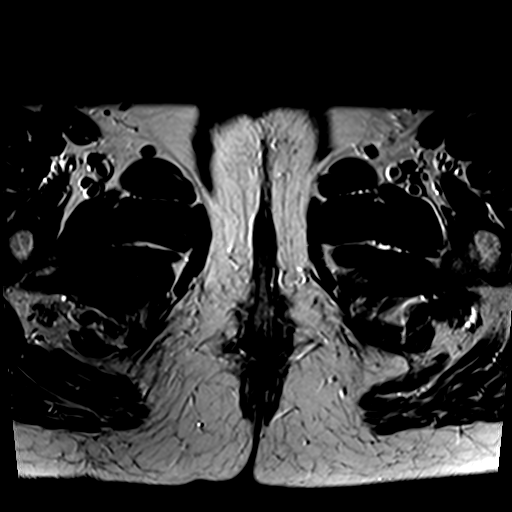
[im 7/27]
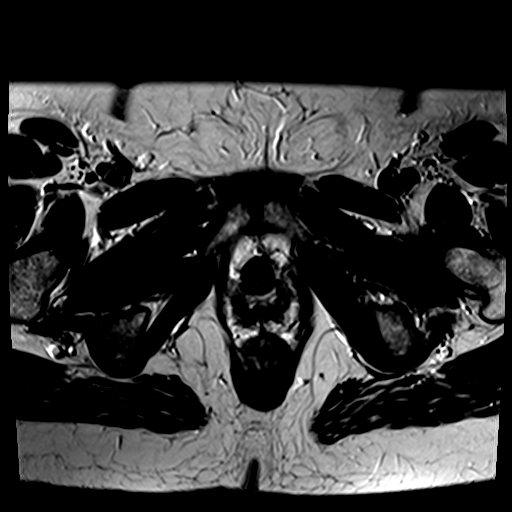
[im 14/27]
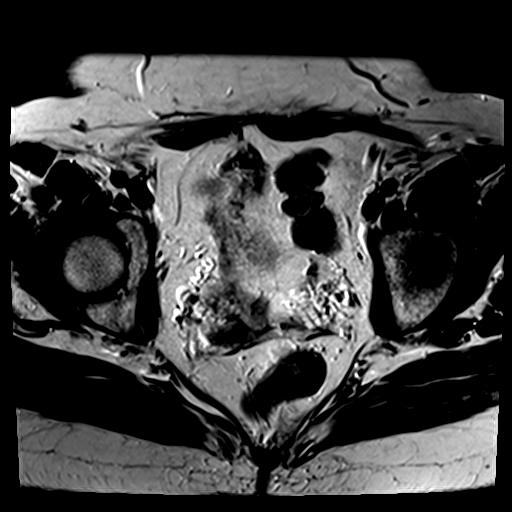
[im 20/27]
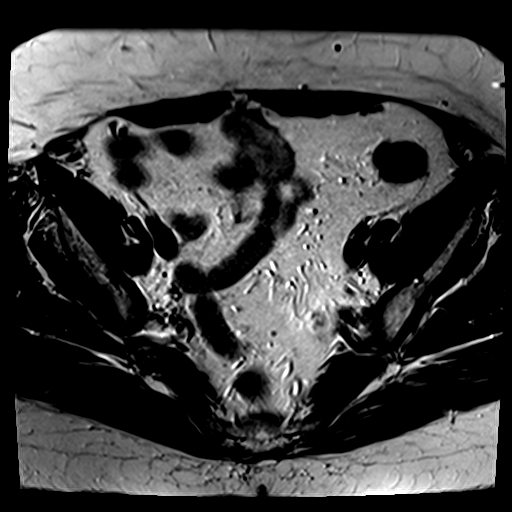
[im 27/27]
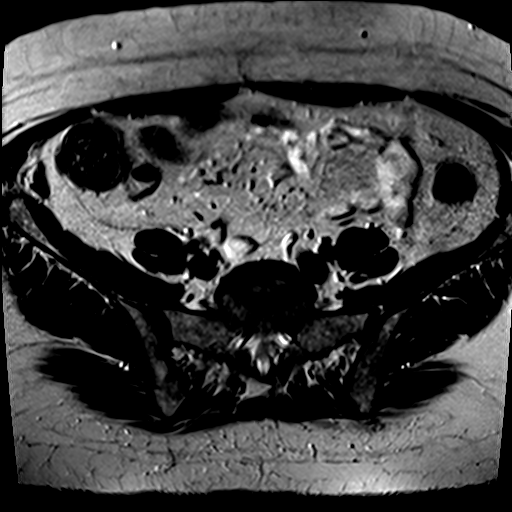

[Series 6: t2_tse axial fs · axial · 5.0mm · 0.51mm/px · z∈[-27,+136]mm · 5 of 27 slices shown]
[im 1/27]
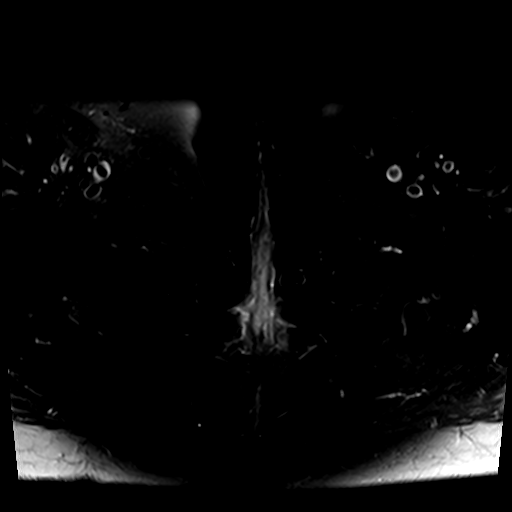
[im 7/27]
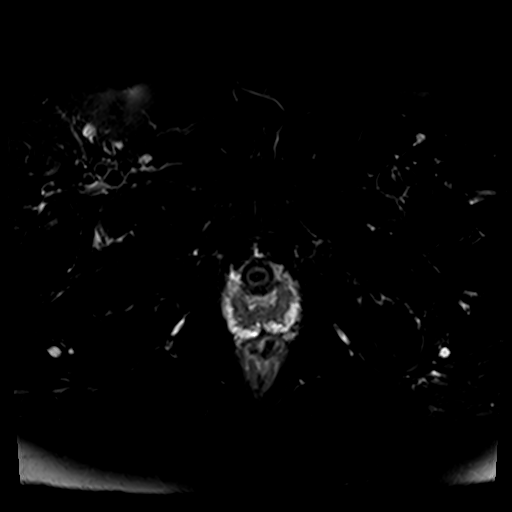
[im 14/27]
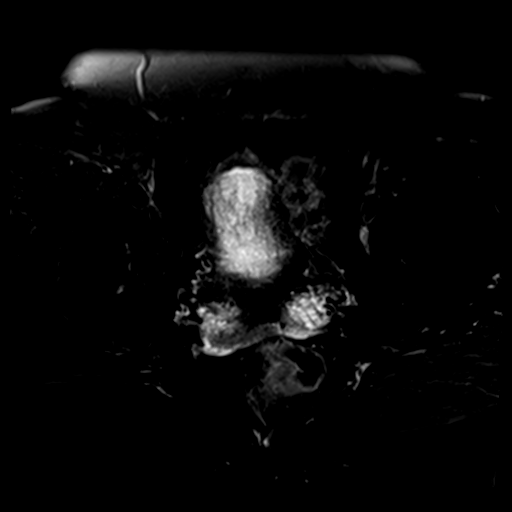
[im 20/27]
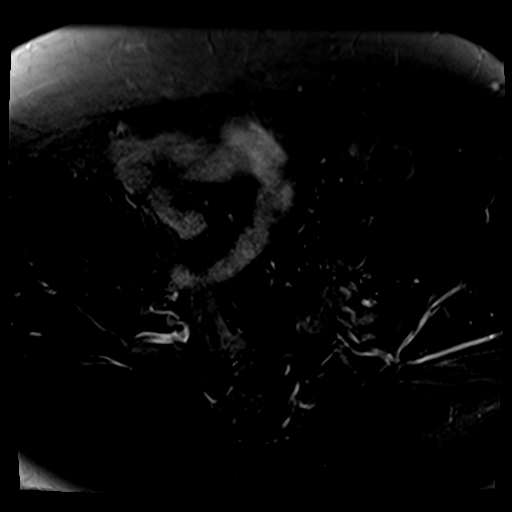
[im 27/27]
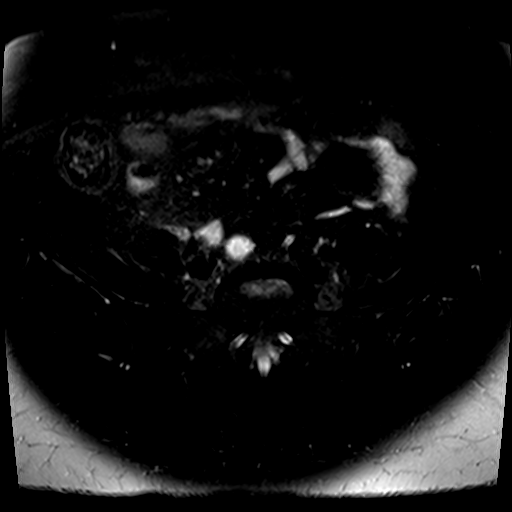

[Series 7: axial spgr · axial · 5.0mm · 0.51mm/px · z∈[-27,+55]mm · 3 of 27 slices shown]
[im 1/27]
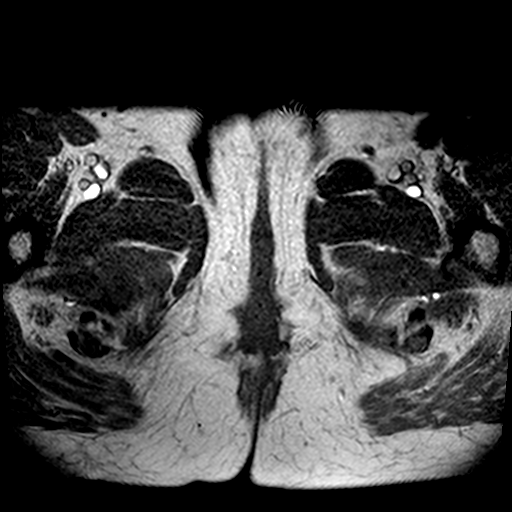
[im 7/27]
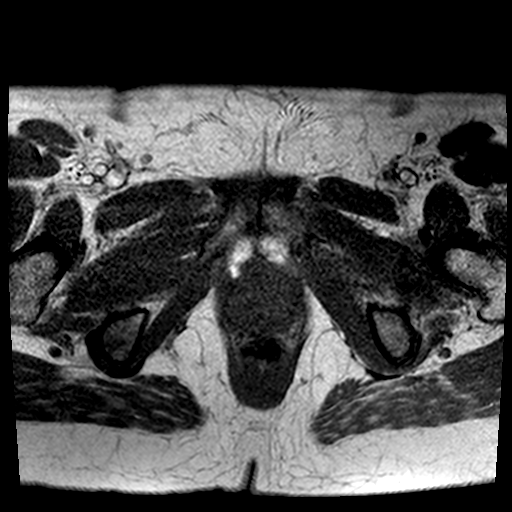
[im 14/27]
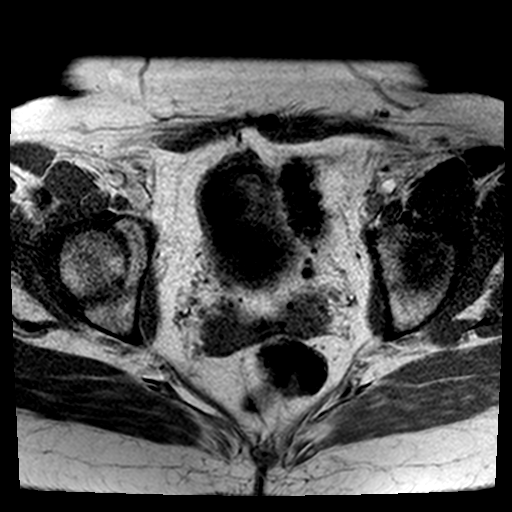

[21 of 48 positions shown; findings below may reference images not displayed]

FINDINGS: Urinary Tract: No urinary bladder or urethral abnormality.

Bowel: Unremarkable pelvic bowel loops.

Vascular/Lymphatic: Unremarkable. No pathologically enlarged pelvic
lymph nodes identified.

Reproductive:

-- Uterus: Surgically absent. Vaginal cuff is normal in appearance.
No evidence of pelvic cul-de-sac mass.

-- Right ovary: Not visualized, however no adnexal mass identified.

-- Left ovary:  Not visualized, however no adnexal mass identified.

Other: No peritoneal masses or thickening identified. No peritoneal
T1 hyperintensity or contrast enhancement. No free fluid.

Musculoskeletal:  Unremarkable.
IMPRESSION: Previous hysterectomy. No radiographic evidence of endometriosis or
other significant abnormality.

## 2018-08-05 DIAGNOSIS — F411 Generalized anxiety disorder: Secondary | ICD-10-CM | POA: Diagnosis not present

## 2018-08-12 DIAGNOSIS — M9904 Segmental and somatic dysfunction of sacral region: Secondary | ICD-10-CM | POA: Diagnosis not present

## 2018-08-12 DIAGNOSIS — M5441 Lumbago with sciatica, right side: Secondary | ICD-10-CM | POA: Diagnosis not present

## 2018-08-12 DIAGNOSIS — M546 Pain in thoracic spine: Secondary | ICD-10-CM | POA: Diagnosis not present

## 2018-08-12 DIAGNOSIS — M9903 Segmental and somatic dysfunction of lumbar region: Secondary | ICD-10-CM | POA: Diagnosis not present

## 2018-08-20 ENCOUNTER — Ambulatory Visit (INDEPENDENT_AMBULATORY_CARE_PROVIDER_SITE_OTHER)
Admission: EM | Admit: 2018-08-20 | Discharge: 2018-08-20 | Disposition: A | Discharge status: Home / Self Care | Payer: Medicare Other | Source: Home / Self Care

## 2018-08-20 ENCOUNTER — Encounter (HOSPITAL_COMMUNITY): Payer: Self-pay | Admitting: Emergency Medicine

## 2018-08-20 ENCOUNTER — Other Ambulatory Visit: Payer: Self-pay

## 2018-08-20 ENCOUNTER — Encounter (HOSPITAL_COMMUNITY): Payer: Self-pay

## 2018-08-20 ENCOUNTER — Emergency Department (HOSPITAL_COMMUNITY)
Admission: EM | Admit: 2018-08-20 | Discharge: 2018-08-21 | Disposition: A | Discharge status: Home / Self Care | Payer: Medicare Other | Attending: Emergency Medicine | Admitting: Emergency Medicine

## 2018-08-20 DIAGNOSIS — Z87891 Personal history of nicotine dependence: Secondary | ICD-10-CM | POA: Diagnosis not present

## 2018-08-20 DIAGNOSIS — Z79899 Other long term (current) drug therapy: Secondary | ICD-10-CM | POA: Diagnosis not present

## 2018-08-20 DIAGNOSIS — H9201 Otalgia, right ear: Secondary | ICD-10-CM

## 2018-08-20 DIAGNOSIS — E039 Hypothyroidism, unspecified: Secondary | ICD-10-CM | POA: Diagnosis not present

## 2018-08-20 DIAGNOSIS — Z9621 Cochlear implant status: Secondary | ICD-10-CM | POA: Insufficient documentation

## 2018-08-20 NOTE — Discharge Instructions (Addendum)
You need to see your ENT doctor to see if there is a problem with your implant. You probably should have a CT scan to look at the implant. You can ask your primary care provider to order the CT scan. Return to the Emergency Department if pain is getting worse, or if you start running a fever.

## 2018-08-20 NOTE — ED Provider Notes (Signed)
[facility] EMERGENCY DEPARTMENT Provider Note   CSN: [id number] Arrival date & time: 08/20/18  2014     History   Chief Complaint Chief Complaint  Patient presents with  . Otalgia    HPI Donna Davis is a 47 y.o. female.  The history is provided by the patient. A language interpreter was used Soil scientist).  She has history of hypothyroidism, GERD, irritable bowel syndrome, anxiety and comes in complaining of pain around her right ear.  She had a cochlear implant placed last May.  About 1 week ago, she started having pain behind her ear.  It is gotten worse over the last 2 days.  It seems to radiate down into her neck.  Yesterday, she had some drainage of some clear fluid, and pain seemed to improve briefly.  She has been having difficulty sleeping because of the pain.  She has not had any fever.  She has not been able to connect her cochlear implant.  She did contact her physician at [facility], and cannot be seen there until the next week.  She went to urgent care and was referred to the emergency department.  Past Medical History:  Diagnosis Date  . Anxiety   . Endometriosis    s/p TAH, LSO  . Family history of malignant neoplasm of gastrointestinal tract   . GERD (gastroesophageal reflux disease)   . Hearing difficulty    b/l, 2/2 congenital rubella s/p stapedectomy. Using hearing aid  . Hepatic cyst    6 mm on CT done done in 05/2005  . Hypothyroidism   . IBS (irritable bowel syndrome)   . Migraines    "frequency depends on my stress level" (07/23/2017)  . PONV (postoperative nausea and vomiting)    "sometimes; depends on how long I'm under"  . Shingles 03/2013    Patient Active Problem List   Diagnosis Date Noted  . Endometriosis 04/09/2018  . Right hip pain 04/09/2018  . Allergic rhinitis due to allergen 12/19/2016  . Cochlear implant in place 12/05/2016  . Popliteal cyst, right 08/27/2016  . Bilateral deafness 01/17/2016  . Family history  of breast cancer in mother 08/26/2015  . Obesity (BMI 30-39.9) 02/18/2014  . Amitriptyline adverse reaction 04/08/2013  . Migraine headache 12/02/2012  . Routine adult health maintenance 09/01/2012  . Anxiety state 09/03/2006  . Hypothyroidism 07/01/2006  . GERD 07/01/2006  . IBS 07/01/2006    Past Surgical History:  Procedure Laterality Date  . ABDOMINAL ADHESION SURGERY     Enterolysis  . CESAREAN SECTION  1995  . CHOLECYSTECTOMY N/A 06/26/2013   Procedure: LAPAROSCOPIC CHOLECYSTECTOMY WITH ATTEMPTED INTRAOPERATIVE CHOLANGIOGRAM;  Surgeon: Harl Bowie, MD;  Location: [facility];  Service: General;  Laterality: N/A;  . COCHLEAR IMPLANT  03/30/2016  . COCHLEAR IMPLANT Right 03/2015  . COLONOSCOPY WITH PROPOFOL  04/23/2012  . DIAGNOSTIC LAPAROSCOPY  01/14/2006   "I've had several" (07/23/2017)  . ESOPHAGOGASTRODUODENOSCOPY (EGD) WITH PROPOFOL  04/23/2012  . JOINT REPLACEMENT    . KNEE SURGERY Right 2014  . LAPAROSCOPIC OVARIAN CYSTECTOMY Right 04/16/2000  . SALPINGOOPHORECTOMY Left   . STAPEDECTOMY Left 05/14/2006  . Thyroid Nodule removed  1997  . TONSILLECTOMY    . TOTAL ABDOMINAL HYSTERECTOMY  01/13/2004   Endometriosis with residual right ovary. Multiple GYN surgeries for chronic pelvic pain; had LSO in past  . TUBAL LIGATION    . WOUND EXPLORATION Right 10/16/2016   Procedure: RIGHT EXPLORATION OF LACERATION OF INDEX FINGER;  Surgeon:  Leanora Cover, MD;  Location: [facility];  Service: Orthopedics;  Laterality: Right;     OB History    Gravida  3   Para  2   Term  2   Preterm      AB  1   Living  2     SAB      TAB  1   Ectopic      Multiple      Live Births               Home Medications    Prior to Admission medications   Medication Sig Start Date End Date Taking? Authorizing Provider  albuterol (PROVENTIL HFA;VENTOLIN HFA) 108 (90 BASE) MCG/ACT inhaler Inhale into the lungs every 6 (six) hours as needed for wheezing or  shortness of breath.    [provider]  ALPRAZolam Duanne Moron) 0.25 MG tablet Take 1 tablet (0.25 mg total) by mouth at bedtime as needed for anxiety. 06/10/18   Sid Falcon, MD  amoxicillin-clavulanate (AUGMENTIN) 875-125 MG tablet Take 1 tablet by mouth 2 (two) times daily. One po bid x 7 days 06/13/18   Margarita Mail, PA-C  Ascorbic Acid (VITAMIN C PO) Take 1 tablet by mouth daily.    [provider]  betamethasone valerate (VALISONE) 0.1 % cream APPLY 1 APPLICATION ON THE SKIN TWICE DAILY AS NEEDED 03/31/18   [provider]  busPIRone (BUSPAR) 10 MG tablet Take 5-20 mg by mouth 3 (three) times daily. Takes 1 tab in am, 1/2 tab at lunch ad 1-2 tabs at bedtime    [provider]  estradiol (ESTRACE) 1 MG tablet TAKE 1 TABLET BY MOUTH ONCE DAILY 04/11/18   Donnamae Jude, MD  famotidine (PEPCID) 40 MG tablet Take 1 tablet (40 mg total) by mouth 2 (two) times daily as needed for heartburn or indigestion. 08/27/16   Asencion Partridge, MD  gabapentin (NEURONTIN) 300 MG capsule TAKE 1 CAPSULE BY MOUTH THREE TIMES A DAY 01/21/18   Anyanwu, Sallyanne Havers, MD  HYDROcodone-acetaminophen (NORCO) 5-325 MG tablet Take 2 tablets by mouth every 4 (four) hours as needed. 06/13/18   Margarita Mail, PA-C  ibuprofen (ADVIL,MOTRIN) 800 MG tablet Take 1 tablet (800 mg total) by mouth every 8 (eight) hours as needed. 09/20/16   Melynda Ripple, MD  levothyroxine (SYNTHROID, LEVOTHROID) 25 MCG tablet Take 1 tablet (25 mcg total) by mouth daily. 01/06/18   Oval Linsey, MD  omeprazole (PRILOSEC) 20 MG capsule Take 1 capsule (20 mg total) by mouth 2 (two) times daily before a meal. 04/09/18 04/09/19  Sid Falcon, MD  traMADol (ULTRAM) 50 MG tablet TAKE 1 TABLET EVERY 6 HOURS AS NEEDED FOR MODERATE-SEVERE PAIN 05/27/18   Sid Falcon, MD    Family History Family History  Problem Relation Age of Onset  . Breast cancer Mother   . Ovarian cancer Mother   . Diabetes Mother   . Pancreatic  cancer Father   . Prostate cancer Father   . Colon cancer Father   . Diabetes Father   . Heart disease Father   . Irritable bowel syndrome Father   . Kidney disease Father   . Stomach cancer Maternal Grandmother   . Colon cancer Maternal Grandmother   . Diabetes Maternal Grandmother   . Heart disease Unknown        both grandmothers  . Bipolar disorder Daughter     Social History Social History   Tobacco Use  .  Smoking status: Former Smoker    Packs/day: 1.00    Years: 26.00    Pack years: 26.00    Types: Cigarettes    Last attempt to quit: 02/18/2008    Years since quitting: 10.5  . Smokeless tobacco: Never Used  Substance Use Topics  . Alcohol use: Yes    Comment: Occasionally.  . Drug use: No     Allergies   Onion; Other; Shellfish allergy; Amitriptyline; Amoxicillin-pot clavulanate; Amoxicillin-pot clavulanate; Cefuroxime; Cephalexin; Ciprofloxacin; Clarithromycin; Clarithromycin; Doxycycline; Propranolol; Sulfonamide derivatives; Sulfa antibiotics; Sulfasalazine; Benadryl [diphenhydramine hcl (sleep)]; and Shrimp flavor   Review of Systems Review of Systems  All other systems reviewed and are negative.    Physical Exam Updated Vital Signs BP (!) 166/87   Pulse 85   Temp 99 F (37.2 C) (Oral)   Resp 20   LMP 12/26/2012   SpO2 97%   Physical Exam  Nursing note and vitals reviewed.  47 year old female, resting comfortably and in no acute distress. Vital signs are significant for elevated blood pressure. Oxygen saturation is 97%, which is normal. Head is normocephalic and atraumatic. PERRLA, EOMI. Oropharynx is clear. Neck is nontender and supple without adenopathy or JVD.  There is minimal erythema of the upper posterior aspect of the right tympanic membrane.  There is a normal light reflex.  There is tenderness to palpation in the postauricular area on the right without any mass or fluctuance felt. Back is nontender and there is no CVA tenderness. Lungs  are clear without rales, wheezes, or rhonchi. Chest is nontender. Heart has regular rate and rhythm without murmur. Abdomen is soft, flat, nontender without masses or hepatosplenomegaly and peristalsis is normoactive. Extremities have no cyanosis or edema, full range of motion is present. Skin is warm and dry without rash. Neurologic: Mental status is normal, cranial nerves are intact, there are no motor or sensory deficits.  ED Treatments / Results   Procedures Procedures  Medications Ordered in ED Medications - No data to display   Initial Impression / Assessment and Plan / ED Course  I have reviewed the triage vital signs and the nursing notes.  Pain posterior to the right ear and patient with cochlear implant in place.  I have recommended CT scan to look for possible fluid collection around the implant.  However, patient states that she has an exam tomorrow for her masters program and it cannot be postponed and she does not wish to wait for the CT to be done.  I have explained to her that CT will probably be necessary for proper evaluation by her physicians at [facility].  I have advised her that she is welcome to return to the emergency department at any time and CT can be done at that point.  Also, suggested that she might contact her primary care provider to see if CT can be done as an outpatient.  Continue to try to get into see her ENT physician as soon as possible.  Told to return for worsening pain or fever.  Final Clinical Impressions(s) / ED Diagnoses   Final diagnoses:  Right ear pain    ED Discharge Orders    None       Delora Fuel, MD 53/[id number]

## 2018-08-20 NOTE — ED Triage Notes (Addendum)
[facility] sign language used: #[id number] Donna Davis.  Pt has a right cochlear implant that was put in in May.  Pt has been having right ear pressure for one week and she has had fluid leaking as well.  She has been having a lot of pain for the last two days.  The surgeon who did the implant stated she needed to be seen right away instead of waiting to see him.

## 2018-08-20 NOTE — ED Triage Notes (Signed)
Pt is deaf. Pt states that she was seen at [facility] and told to come here, pt has a cochlear implant since May, over the past week, pain to R side of head, leaking of fluid out of ear, she spoke with surgeon and told that the mastoid may be infected and need a CT scan.

## 2018-08-20 NOTE — ED Provider Notes (Signed)
Donna Davis    CSN: [id number] Arrival date & time: 08/20/18  1801     History   Chief Complaint Chief Complaint  Patient presents with  . Otalgia    right    HPI 37 of conversation interpreted via ASL/Stratus interpreter Donna Davis is a 47 y.o. female history of anxiety, bilateral deafness, presenting today for evaluation of right ear pain.  Patient had cochlear implant placed in May of this year.  Over the past week she has had increased pressure to her ear.  Over the past couple days pain has significantly worsened.  She has been taking tramadol without relief.  She did notice a small amount of drainage previously coming out of the right ear.  Denies any pain in the left ear.  Most of her pain is behind her ear.  Has pain with turning head towards right, but is able.  Denies associated URI symptoms.  She contacted her surgeon who recommended her going to the emergency room in case there is infection in the mastoid.  HPI  Past Medical History:  Diagnosis Date  . Anxiety   . Endometriosis    s/p TAH, LSO  . Family history of malignant neoplasm of gastrointestinal tract   . GERD (gastroesophageal reflux disease)   . Hearing difficulty    b/l, 2/2 congenital rubella s/p stapedectomy. Using hearing aid  . Hepatic cyst    6 mm on CT done done in 05/2005  . Hypothyroidism   . IBS (irritable bowel syndrome)   . Migraines    "frequency depends on my stress level" (07/23/2017)  . PONV (postoperative nausea and vomiting)    "sometimes; depends on how long I'm under"  . Shingles 03/2013    Patient Active Problem List   Diagnosis Date Noted  . Endometriosis 04/09/2018  . Right hip pain 04/09/2018  . Allergic rhinitis due to allergen 12/19/2016  . Cochlear implant in place 12/05/2016  . Popliteal cyst, right 08/27/2016  . Bilateral deafness 01/17/2016  . Family history of breast cancer in mother 08/26/2015  . Obesity (BMI 30-39.9) 02/18/2014  . Amitriptyline  adverse reaction 04/08/2013  . Migraine headache 12/02/2012  . Routine adult health maintenance 09/01/2012  . Anxiety state 09/03/2006  . Hypothyroidism 07/01/2006  . GERD 07/01/2006  . IBS 07/01/2006    Past Surgical History:  Procedure Laterality Date  . ABDOMINAL ADHESION SURGERY     Enterolysis  . CESAREAN SECTION  1995  . CHOLECYSTECTOMY N/A 06/26/2013   Procedure: LAPAROSCOPIC CHOLECYSTECTOMY WITH ATTEMPTED INTRAOPERATIVE CHOLANGIOGRAM;  Surgeon: Harl Bowie, MD;  Location: [facility];  Service: General;  Laterality: N/A;  . COCHLEAR IMPLANT  03/30/2016  . COCHLEAR IMPLANT Right 03/2015  . COLONOSCOPY WITH PROPOFOL  04/23/2012  . DIAGNOSTIC LAPAROSCOPY  01/14/2006   "I've had several" (07/23/2017)  . ESOPHAGOGASTRODUODENOSCOPY (EGD) WITH PROPOFOL  04/23/2012  . JOINT REPLACEMENT    . KNEE SURGERY Right 2014  . LAPAROSCOPIC OVARIAN CYSTECTOMY Right 04/16/2000  . SALPINGOOPHORECTOMY Left   . STAPEDECTOMY Left 05/14/2006  . Thyroid Nodule removed  1997  . TONSILLECTOMY    . TOTAL ABDOMINAL HYSTERECTOMY  01/13/2004   Endometriosis with residual right ovary. Multiple GYN surgeries for chronic pelvic pain; had LSO in past  . TUBAL LIGATION    . WOUND EXPLORATION Right 10/16/2016   Procedure: RIGHT EXPLORATION OF LACERATION OF INDEX FINGER;  Surgeon: Leanora Cover, MD;  Location: [place];  Service: Orthopedics;  Laterality: Right;  OB History    Gravida  3   Para  2   Term  2   Preterm      AB  1   Living  2     SAB      TAB  1   Ectopic      Multiple      Live Births               Home Medications    Prior to Admission medications   Medication Sig Start Date End Date Taking? Authorizing Provider  albuterol (PROVENTIL HFA;VENTOLIN HFA) 108 (90 BASE) MCG/ACT inhaler Inhale into the lungs every 6 (six) hours as needed for wheezing or shortness of breath.    [provider]  ALPRAZolam Duanne Moron) 0.25 MG tablet Take 1 tablet  (0.25 mg total) by mouth at bedtime as needed for anxiety. 06/10/18   Sid Falcon, MD  amoxicillin-clavulanate (AUGMENTIN) 875-125 MG tablet Take 1 tablet by mouth 2 (two) times daily. One po bid x 7 days 06/13/18   Margarita Mail, PA-C  Ascorbic Acid (VITAMIN C PO) Take 1 tablet by mouth daily.    [provider]  betamethasone valerate (VALISONE) 0.1 % cream APPLY 1 APPLICATION ON THE SKIN TWICE DAILY AS NEEDED 03/31/18   [provider]  busPIRone (BUSPAR) 10 MG tablet Take 5-20 mg by mouth 3 (three) times daily. Takes 1 tab in am, 1/2 tab at lunch ad 1-2 tabs at bedtime    [provider]  estradiol (ESTRACE) 1 MG tablet TAKE 1 TABLET BY MOUTH ONCE DAILY 04/11/18   Donnamae Jude, MD  famotidine (PEPCID) 40 MG tablet Take 1 tablet (40 mg total) by mouth 2 (two) times daily as needed for heartburn or indigestion. 08/27/16   Asencion Partridge, MD  gabapentin (NEURONTIN) 300 MG capsule TAKE 1 CAPSULE BY MOUTH THREE TIMES A DAY 01/21/18   Anyanwu, Sallyanne Havers, MD  HYDROcodone-acetaminophen (NORCO) 5-325 MG tablet Take 2 tablets by mouth every 4 (four) hours as needed. 06/13/18   Margarita Mail, PA-C  ibuprofen (ADVIL,MOTRIN) 800 MG tablet Take 1 tablet (800 mg total) by mouth every 8 (eight) hours as needed. 09/20/16   Melynda Ripple, MD  levothyroxine (SYNTHROID, LEVOTHROID) 25 MCG tablet Take 1 tablet (25 mcg total) by mouth daily. 01/06/18   Oval Linsey, MD  omeprazole (PRILOSEC) 20 MG capsule Take 1 capsule (20 mg total) by mouth 2 (two) times daily before a meal. 04/09/18 04/09/19  Sid Falcon, MD  traMADol (ULTRAM) 50 MG tablet TAKE 1 TABLET EVERY 6 HOURS AS NEEDED FOR MODERATE-SEVERE PAIN 05/27/18   Sid Falcon, MD    Family History Family History  Problem Relation Age of Onset  . Breast cancer Mother   . Ovarian cancer Mother   . Diabetes Mother   . Pancreatic cancer Father   . Prostate cancer Father   . Colon cancer Father   . Diabetes Father   . Heart  disease Father   . Irritable bowel syndrome Father   . Kidney disease Father   . Stomach cancer Maternal Grandmother   . Colon cancer Maternal Grandmother   . Diabetes Maternal Grandmother   . Heart disease Unknown        both grandmothers  . Bipolar disorder Daughter     Social History Social History   Tobacco Use  . Smoking status: Former Smoker    Packs/day: 1.00    Years: 26.00    Pack years:  26.00    Types: Cigarettes    Last attempt to quit: 02/18/2008    Years since quitting: 10.5  . Smokeless tobacco: Never Used  Substance Use Topics  . Alcohol use: Yes    Comment: Occasionally.  . Drug use: No     Allergies   Onion; Other; Shellfish allergy; Amitriptyline; Amoxicillin-pot clavulanate; Amoxicillin-pot clavulanate; Cefuroxime; Cephalexin; Ciprofloxacin; Clarithromycin; Clarithromycin; Doxycycline; Propranolol; Sulfonamide derivatives; Sulfa antibiotics; Sulfasalazine; Benadryl [diphenhydramine hcl (sleep)]; and Shrimp flavor   Review of Systems Review of Systems  Constitutional: Negative for activity change, appetite change, chills, fatigue and fever.  HENT: Positive for ear discharge and ear pain. Negative for congestion, rhinorrhea, sinus pressure, sore throat and trouble swallowing.   Eyes: Negative for discharge and redness.  Respiratory: Negative for cough, chest tightness and shortness of breath.   Cardiovascular: Negative for chest pain.  Gastrointestinal: Negative for abdominal pain, diarrhea, nausea and vomiting.  Musculoskeletal: Negative for myalgias.  Skin: Negative for rash.  Neurological: Negative for dizziness, light-headedness and headaches.     Physical Exam Triage Vital Signs ED Triage Vitals  Enc Vitals Group     BP 08/20/18 1930 (!) 160/92     Pulse Rate 08/20/18 1930 77     Resp --      Temp 08/20/18 1930 98.3 F (36.8 C)     Temp Source 08/20/18 1930 Oral     SpO2 08/20/18 1930 99 %     Weight --      Height --      Head  Circumference --      Peak Flow --      Pain Score 08/20/18 1925 9     Pain Loc --      Pain Edu? --      Excl. in [place]? --    No data found.  Updated Vital Signs BP (!) 160/92 (BP Location: Right Arm)   Pulse 77   Temp 98.3 F (36.8 C) (Oral)   LMP 12/26/2012   SpO2 99%   Visual Acuity Right Eye Distance:   Left Eye Distance:   Bilateral Distance:    Right Eye Near:   Left Eye Near:    Bilateral Near:     Physical Exam  Constitutional: She is oriented to person, place, and time. She appears well-developed and well-nourished.  No acute distress  HENT:  Head: Normocephalic and atraumatic.  Nose: Nose normal.  Bilateral ears without tenderness to palpation of external auricle, tragus and mastoid, EAC's without erythema or swelling, TM's with good bony landmarks and cone of light. Non erythematous.  Well-healed surgical scar behind right ear, tenderness to right mastoid extending superiorly into temporal area of skull/scalp No significant erythema to this area  Eyes: Conjunctivae are normal.  Neck: Neck supple.  Cardiovascular: Normal rate.  Pulmonary/Chest: Effort normal. No respiratory distress.  Abdominal: She exhibits no distension.  Musculoskeletal: Normal range of motion.  Neurological: She is alert and oriented to person, place, and time.  Skin: Skin is warm and dry.  Psychiatric: She has a normal mood and affect.  Nursing note and vitals reviewed.    UC Treatments / Results  Labs (all labs ordered are listed, but only abnormal results are displayed) Labs Reviewed - No data to display  EKG None  Radiology No results found.  Procedures Procedures (including critical care time)  Medications Ordered in UC Medications - No data to display  Initial Impression / Assessment and Plan / UC Course  I have reviewed  the triage vital signs and the nursing notes.  Pertinent labs & imaging results that were available during my care of the patient were reviewed  by me and considered in my medical decision making (see chart for details).     Bilateral ears without signs of infection, tender to right mastoid, given recommendations from surgeon, recommending to go to emergency room for further evaluation/imaging to ensure infection not an area of surgery/mastoid.Discussed strict return precautions. Patient verbalized understanding and is agreeable with plan.  Final Clinical Impressions(s) / UC Diagnoses   Final diagnoses:  Right ear pain   Discharge Instructions   None    ED Prescriptions    None     Controlled Substance Prescriptions [place] Controlled Substance Registry consulted? Not Applicable   Janith Lima, [place] 08/20/18 2000

## 2018-08-21 ENCOUNTER — Encounter: Payer: Self-pay | Admitting: Internal Medicine

## 2018-08-21 ENCOUNTER — Ambulatory Visit (HOSPITAL_COMMUNITY)
Admission: RE | Admit: 2018-08-21 | Discharge: 2018-08-21 | Disposition: A | Discharge status: Home / Self Care | Payer: Medicare Other | Source: Ambulatory Visit | Attending: Internal Medicine | Admitting: Internal Medicine

## 2018-08-21 ENCOUNTER — Ambulatory Visit (INDEPENDENT_AMBULATORY_CARE_PROVIDER_SITE_OTHER): Payer: Medicare Other | Admitting: Internal Medicine

## 2018-08-21 VITALS — BP 154/82 | Temp 98.8°F | Ht 60.0 in | Wt 193.7 lb

## 2018-08-21 DIAGNOSIS — H6690 Otitis media, unspecified, unspecified ear: Secondary | ICD-10-CM

## 2018-08-21 DIAGNOSIS — R51 Headache: Secondary | ICD-10-CM | POA: Diagnosis not present

## 2018-08-21 DIAGNOSIS — H7091 Unspecified mastoiditis, right ear: Secondary | ICD-10-CM | POA: Insufficient documentation

## 2018-08-21 MED ORDER — AMOXICILLIN-POT CLAVULANATE 875-125 MG PO TABS: 1.0000 | ORAL_TABLET | Freq: Two times a day (BID) | ORAL | 0 refills | Status: AC

## 2018-08-21 MED ORDER — IOHEXOL 300 MG/ML  SOLN
75.0000 mL | Freq: Once | INTRAMUSCULAR | Status: AC | PRN
  Administered 2018-08-21: 75 mL via INTRAVENOUS

## 2018-08-21 NOTE — Progress Notes (Signed)
   CC: Right Ear Pain  HPI:  [name] is a 47 y.o. F with PMHx listed below presenting for Right Ear Pain. Please see the A&P for the status of the patient's chronic medical problems.   Patient has been experiencing pain behind her right ear at the site where she had a cochlear implant in May of this year. The pain has been ongoing for about a week and is described as a pressure-like pain. She has not be able to connect her implant device. She endorses a small amount of drainage form her ear at one point that seemed to coincide with a decrease in the pressure. She tried tramadol at home with no relief. She contacted her surgeon who place the Implant at [facility] who is unable to see her until next week and suggested she go to the ED for further evaluation. She was seen by urgent care and then the ED yesterday. A CT scan was recommended at that time but patient chose to defer this due to a delay in getting the scan due to the computer system being down in the ED. She presents now for outpatient referral for CT for ongoing pain in and behind her right ear.  Past Medical History:  Diagnosis Date  . Anxiety   . Endometriosis    s/p TAH, LSO  . Family history of malignant neoplasm of gastrointestinal tract   . GERD (gastroesophageal reflux disease)   . Hearing difficulty    b/l, 2/2 congenital rubella s/p stapedectomy. Using hearing aid  . Hepatic cyst    6 mm on CT done done in 05/2005  . Hypothyroidism   . IBS (irritable bowel syndrome)   . Migraines    "frequency depends on my stress level" (07/23/2017)  . PONV (postoperative nausea and vomiting)    "sometimes; depends on how long I'm under"  . Shingles 03/2013   Review of Systems: Performed and all others negative.  Physical Exam:  Vitals:   08/21/18 1535  BP: (!) 154/82  Temp: 98.8 F (37.1 C)  SpO2: 100%  Weight: 193 lb 11.2 oz (87.9 kg)  Height: 5' (1.524 m)   Physical Exam  Constitutional: She appears well-developed and  well-nourished.  Mildly distressed  HENT:  Left Ear: External ear normal.  Right ear with bulging TM, and fluid behind the TM. Mildly erythematous distal canal. No tenderness to manipulation of her right ear. Tender behind right ear. Surgery site without edema or significant erythema.  Cardiovascular: Normal rate, regular rhythm, normal heart sounds and intact distal pulses.  Pulmonary/Chest: Effort normal and breath sounds normal. No respiratory distress.  Abdominal: Soft. Bowel sounds are normal. She exhibits no distension. There is no tenderness.  Musculoskeletal: She exhibits no edema or deformity.  Skin: Skin is warm and dry.     Assessment & Plan:   See Encounters Tab for problem based charting.  Patient discussed with Dr. Dareen Piano

## 2018-08-21 NOTE — Patient Instructions (Addendum)
Thank you for allowing [place] to care for you  For the pain behind your right ear - We have ordered an CT for further evaluation of your infection - We have ordered Augmentin to be taken twice daily for 10 days - The recommendations may change based on the results of the CT scan  If you experience severe symptoms including high fever, increasing pain, changes in your vision or other neurologic symptoms; please return to the ED for further evaluation.

## 2018-08-21 NOTE — ED Notes (Signed)
Pt refused d/c vitals, ambulated out of ED gait steady in NAD

## 2018-08-22 ENCOUNTER — Encounter: Payer: Self-pay | Admitting: Internal Medicine

## 2018-08-22 ENCOUNTER — Telehealth: Payer: Self-pay | Admitting: Internal Medicine

## 2018-08-22 DIAGNOSIS — H6690 Otitis media, unspecified, unspecified ear: Secondary | ICD-10-CM | POA: Insufficient documentation

## 2018-08-22 NOTE — Telephone Encounter (Signed)
Attempted to cal patient's husband to inform them of the results of her CT scan which was consistent with acute otitis media or cholesteatoma with reactive effusion in mastoid cavity.  - No answer will call back - Patient is to continue antibiotics as prescribed

## 2018-08-22 NOTE — Progress Notes (Signed)
Internal Medicine Clinic Attending  Case discussed with Dr. Melvin  at the time of the visit.  We reviewed the resident's history and exam and pertinent patient test results.  I agree with the assessment, diagnosis, and plan of care documented in the resident's note.  

## 2018-08-22 NOTE — Telephone Encounter (Signed)
Spoke with patient using an interpreter. She was informed of her CT results consistent with acute otitis media and the need to continue antibiotics. She was also given return precautions regarding fevers, vision changes, or other neurologic symptoms. She states she had some blurry vision in her right eye and was told to proceed to the emergency department if this does not improve as she is at an increased risk for her infection to spread given her history of cochlear implant surgeries.  Pearson Grippe

## 2018-08-22 NOTE — Assessment & Plan Note (Addendum)
Patient has been experiencing pain behind her right ear at the site where she had a cochlear implant in May of this year. The pain has been ongoing for about a week and is described as a pressure-like pain. She has not be able to connect her implant device. She endorses a small amount of drainage form her ear at one point that seemed to coincide with a decrease in the pressure. She tried tramadol at home with no relief. She contacted her surgeon who place the Implant at [facility] who is unable to see her until next week and suggested she go to the ED for further evaluation. She was seen by urgent care and then the ED yesterday. A CT scan was recommended at that time but patient chose to defer this due to a delay in getting the scan due to the computer system being down in the ED. She presents now for outpatient referral for CT for ongoing pain in and behind her right ear.  ONnExam, Right ear with bulging TM, and fluid behind the TM. Mildly erythematous distal canal. No tenderness to manipulation of her right ear. Tender behind right ear. Surgery site without edema or significant erythema.  Finding are consistent with R middle ear infection. However, giver her discomfort, tenderness behind her ear, and history of cochlear implant; mastoiditis or other complication needs to be ruled out. Will obtain CT as previously planned and start antibiotic therapy. - Augmentin 875-125mg  BID x 10days - CT Temporal Bones with contrast  ADDENDUM: CT showed: - New fluid level in the mastoidectomy cavity along the implant wire and new opacity in the right tympanic cavity and mastoid antrum with evidence of interval partial right ossicle and scutum erosion. - Differential considerations include Middle Ear Infection and Cholesteatoma. - There is no superficial cellulitis, soft tissue abscess, or other complicating feature (the right sigmoid sinus is patent).  Given these finding will continue current antibiotic therapy and  instruct patient to seek further medical care if she develops fever, skin erythema or edema overlying her ear, or any neurologic symptoms.

## 2018-08-26 DIAGNOSIS — M546 Pain in thoracic spine: Secondary | ICD-10-CM | POA: Diagnosis not present

## 2018-08-26 DIAGNOSIS — H9193 Unspecified hearing loss, bilateral: Secondary | ICD-10-CM | POA: Diagnosis not present

## 2018-08-26 DIAGNOSIS — M9903 Segmental and somatic dysfunction of lumbar region: Secondary | ICD-10-CM | POA: Diagnosis not present

## 2018-08-26 DIAGNOSIS — M26621 Arthralgia of right temporomandibular joint: Secondary | ICD-10-CM

## 2018-08-26 DIAGNOSIS — H838X2 Other specified diseases of left inner ear: Secondary | ICD-10-CM | POA: Diagnosis not present

## 2018-08-26 DIAGNOSIS — M5441 Lumbago with sciatica, right side: Secondary | ICD-10-CM | POA: Diagnosis not present

## 2018-08-26 DIAGNOSIS — M9904 Segmental and somatic dysfunction of sacral region: Secondary | ICD-10-CM | POA: Diagnosis not present

## 2018-08-26 HISTORY — DX: Arthralgia of right temporomandibular joint: M26.621

## 2018-09-01 DIAGNOSIS — M546 Pain in thoracic spine: Secondary | ICD-10-CM | POA: Diagnosis not present

## 2018-09-01 DIAGNOSIS — M9904 Segmental and somatic dysfunction of sacral region: Secondary | ICD-10-CM | POA: Diagnosis not present

## 2018-09-01 DIAGNOSIS — M9903 Segmental and somatic dysfunction of lumbar region: Secondary | ICD-10-CM | POA: Diagnosis not present

## 2018-09-01 DIAGNOSIS — M5441 Lumbago with sciatica, right side: Secondary | ICD-10-CM | POA: Diagnosis not present

## 2018-09-03 ENCOUNTER — Encounter: Payer: Self-pay | Admitting: Internal Medicine

## 2018-09-03 ENCOUNTER — Other Ambulatory Visit: Payer: Self-pay | Admitting: *Deleted

## 2018-09-03 DIAGNOSIS — N951 Menopausal and female climacteric states: Secondary | ICD-10-CM

## 2018-09-04 DIAGNOSIS — M7918 Myalgia, other site: Secondary | ICD-10-CM | POA: Diagnosis not present

## 2018-09-04 DIAGNOSIS — M25551 Pain in right hip: Secondary | ICD-10-CM | POA: Diagnosis not present

## 2018-09-04 MED ORDER — ALPRAZOLAM 0.25 MG PO TABS: 0.2500 mg | ORAL_TABLET | Freq: Every evening | ORAL | 2 refills | Status: DC | PRN

## 2018-09-04 MED ORDER — ESTRADIOL 1 MG PO TABS: 1.0000 mg | ORAL_TABLET | Freq: Every day | ORAL | 11 refills | Status: DC

## 2018-09-04 MED ORDER — FLUCONAZOLE 150 MG PO TABS: 150.0000 mg | ORAL_TABLET | Freq: Every day | ORAL | 1 refills | Status: DC

## 2018-09-16 DIAGNOSIS — M546 Pain in thoracic spine: Secondary | ICD-10-CM | POA: Diagnosis not present

## 2018-09-16 DIAGNOSIS — M9903 Segmental and somatic dysfunction of lumbar region: Secondary | ICD-10-CM | POA: Diagnosis not present

## 2018-09-16 DIAGNOSIS — M9904 Segmental and somatic dysfunction of sacral region: Secondary | ICD-10-CM | POA: Diagnosis not present

## 2018-09-16 DIAGNOSIS — M5441 Lumbago with sciatica, right side: Secondary | ICD-10-CM | POA: Diagnosis not present

## 2018-09-18 ENCOUNTER — Encounter: Payer: Self-pay | Admitting: Internal Medicine

## 2018-09-18 DIAGNOSIS — R102 Pelvic and perineal pain: Secondary | ICD-10-CM

## 2018-09-18 DIAGNOSIS — N809 Endometriosis, unspecified: Secondary | ICD-10-CM

## 2018-09-22 DIAGNOSIS — H903 Sensorineural hearing loss, bilateral: Secondary | ICD-10-CM | POA: Diagnosis not present

## 2018-09-22 DIAGNOSIS — M546 Pain in thoracic spine: Secondary | ICD-10-CM | POA: Diagnosis not present

## 2018-09-22 DIAGNOSIS — M5441 Lumbago with sciatica, right side: Secondary | ICD-10-CM | POA: Diagnosis not present

## 2018-09-22 DIAGNOSIS — M9903 Segmental and somatic dysfunction of lumbar region: Secondary | ICD-10-CM | POA: Diagnosis not present

## 2018-09-22 DIAGNOSIS — M9904 Segmental and somatic dysfunction of sacral region: Secondary | ICD-10-CM | POA: Diagnosis not present

## 2018-09-22 MED ORDER — TRAMADOL HCL 50 MG PO TABS: ORAL_TABLET | ORAL | 3 refills | Status: DC

## 2018-09-29 DIAGNOSIS — F411 Generalized anxiety disorder: Secondary | ICD-10-CM | POA: Diagnosis not present

## 2018-10-02 DIAGNOSIS — M5441 Lumbago with sciatica, right side: Secondary | ICD-10-CM | POA: Diagnosis not present

## 2018-10-02 DIAGNOSIS — M9903 Segmental and somatic dysfunction of lumbar region: Secondary | ICD-10-CM | POA: Diagnosis not present

## 2018-10-02 DIAGNOSIS — M9904 Segmental and somatic dysfunction of sacral region: Secondary | ICD-10-CM | POA: Diagnosis not present

## 2018-10-02 DIAGNOSIS — M546 Pain in thoracic spine: Secondary | ICD-10-CM | POA: Diagnosis not present

## 2018-11-03 ENCOUNTER — Ambulatory Visit (INDEPENDENT_AMBULATORY_CARE_PROVIDER_SITE_OTHER): Payer: Medicare Other | Admitting: Internal Medicine

## 2018-11-03 ENCOUNTER — Other Ambulatory Visit: Payer: Self-pay

## 2018-11-03 VITALS — BP 138/75 | HR 86 | Temp 99.3°F | Wt 183.1 lb

## 2018-11-03 DIAGNOSIS — Z6835 Body mass index (BMI) 35.0-35.9, adult: Secondary | ICD-10-CM | POA: Diagnosis not present

## 2018-11-03 DIAGNOSIS — Z87891 Personal history of nicotine dependence: Secondary | ICD-10-CM | POA: Diagnosis not present

## 2018-11-03 DIAGNOSIS — M5136 Other intervertebral disc degeneration, lumbar region: Secondary | ICD-10-CM | POA: Diagnosis not present

## 2018-11-03 DIAGNOSIS — M9903 Segmental and somatic dysfunction of lumbar region: Secondary | ICD-10-CM | POA: Diagnosis not present

## 2018-11-03 DIAGNOSIS — R634 Abnormal weight loss: Secondary | ICD-10-CM

## 2018-11-03 DIAGNOSIS — K921 Melena: Secondary | ICD-10-CM

## 2018-11-03 DIAGNOSIS — R197 Diarrhea, unspecified: Secondary | ICD-10-CM

## 2018-11-03 DIAGNOSIS — M50322 Other cervical disc degeneration at C5-C6 level: Secondary | ICD-10-CM | POA: Diagnosis not present

## 2018-11-03 DIAGNOSIS — M9901 Segmental and somatic dysfunction of cervical region: Secondary | ICD-10-CM | POA: Diagnosis not present

## 2018-11-03 DIAGNOSIS — K582 Mixed irritable bowel syndrome: Secondary | ICD-10-CM | POA: Diagnosis not present

## 2018-11-03 NOTE — Progress Notes (Signed)
CC: Diarrhea  HPI:  [name] is a 48 y.o. female with irritable bowel syndrome with alternating constipation diarrhea presents with diarrhea.  She describes her normal irritable bowel syndrome symptoms as mostly constipation with 1 to 2 days of diarrhea whenever she is stressed.  She states that around Thanksgiving (3 to 4 months ago) she had an abrupt onset of diarrhea.  She states that the diarrhea will last for 3 to 5 days and she will have maybe 1 to 2 days of normal bowel movements in between.  She initially believed that it was due to her underlying IBS but became concerned whenever she saw blood in her stool.  She describes that times being maroon-colored and other times being bright red blood.  The bleeding has been intermittent over the last several months.  She has also noticed "fat" in her stool.  She believes that coffee and certain types of vegetables such as lettuce worsen the diarrhea.  Additionally she has had a 10 pound unintentional weight loss over the last several months.  She denies fevers and chills.  She states that both her mother and father who she does not live with have had diarrhea over the last several months as well.  Her mother was recently treated for the diarrhea.  She lives with her husband who has not had the symptoms.  She traveled to [place] on a cruise in January.  Her diarrhea symptoms preceded this trip.  She denies any recent camping or eating raw seafood.  Past Medical History:  Diagnosis Date  . Anxiety   . Endometriosis    s/p TAH, LSO  . Family history of malignant neoplasm of gastrointestinal tract   . GERD (gastroesophageal reflux disease)   . Hearing difficulty    b/l, 2/2 congenital rubella s/p stapedectomy. Using hearing aid  . Hepatic cyst    6 mm on CT done done in 05/2005  . Hypothyroidism   . IBS (irritable bowel syndrome)   . Migraines    "frequency depends on my stress level" (07/23/2017)  . PONV (postoperative nausea and  vomiting)    "sometimes; depends on how long I'm under"  . Shingles 03/2013   Review of Systems:   Review of Systems  Constitutional: Positive for weight loss. Negative for chills and fever.  HENT: Negative for congestion and sore throat.   Respiratory: Negative for cough, sputum production and shortness of breath.   Cardiovascular: Negative for chest pain and palpitations.  Gastrointestinal: Positive for abdominal pain and diarrhea. Negative for constipation, nausea and vomiting.  Genitourinary: Negative for dysuria, frequency, hematuria and urgency.  Skin: Negative for itching and rash.  All other systems reviewed and are negative.   Physical Exam:  Vitals:   11/03/18 1015  BP: 138/75  Pulse: 86  Temp: 99.3 F (37.4 C)  TempSrc: Oral  SpO2: 98%  Weight: 183 lb 1.6 oz (83.1 kg)   Physical Exam Vitals signs reviewed.  Constitutional:      Appearance: Normal appearance.  HENT:     Head: Normocephalic and atraumatic.  Cardiovascular:     Rate and Rhythm: Normal rate and regular rhythm.  Pulmonary:     Effort: Pulmonary effort is normal.     Breath sounds: Normal breath sounds.  Abdominal:     General: There is no distension.     Comments: Flat.  Soft.  Suprapubic tenderness to palpation.  Hyperactive bowel sounds.  Genitourinary:    Comments: Rectal exam normal without signs  of internal and external hemorrhoids. Musculoskeletal:        General: No tenderness.     Right lower leg: No edema.     Left lower leg: No edema.  Skin:    General: Skin is warm and dry.  Neurological:     Mental Status: She is alert and oriented to person, place, and time. Mental status is at baseline.  Psychiatric:        Mood and Affect: Mood normal.        Behavior: Behavior normal.     Assessment & Plan:   See Encounters Tab for problem based charting.  Patient discussed with Dr. Angelia Mould

## 2018-11-03 NOTE — Patient Instructions (Signed)
I will call you with the results of your pathogen panel.

## 2018-11-04 NOTE — Assessment & Plan Note (Addendum)
Assessment: Donna Davis presents with a 3 to 75-month history of intermittent diarrhea lasting for 3 to 5 days with associated melena.  I am concerned for an infectious cause given that multiple family members have similar symptoms (ex. Shigella, entameoba, EHEC).  I do believe that this is different from her usual symptoms associated with her IBS with alternating constipation and diarrhea.  If her ID work-up comes back negative she may need to be referred to GI for colonoscopy.  Plan: 1.  Ordered GI pathogen panel, ova and parasites  Addendum 11/07/2018: Ova and parasites negative.  GI pathogen panel showed undetectable levels of any bacteria.  The lab did note "We are UNABLE to reliably determine a result for the specimen due to the presence of PCR inhibitor(s) in the specimen submitted."  I called our micro lab who stated that the PCR inhibitor was likely due to a contaminant within the sample container itself and not from the patient's feces. They recommended repeating the lab.  I contacted Donna Davis about these results and asked her to come back in to have the GI pathogen panel repeated.  She states that she lives 4 hours away in [place] and is unable to readily come back to the clinic.  She does state that her symptoms have resolved.  I asked her to contact the clinic if her symptoms return.

## 2018-11-05 DIAGNOSIS — N61 Mastitis without abscess: Secondary | ICD-10-CM | POA: Diagnosis not present

## 2018-11-06 ENCOUNTER — Encounter: Payer: Self-pay | Admitting: Internal Medicine

## 2018-11-06 LAB — GI PROFILE, STOOL, PCR

## 2018-11-06 LAB — OVA AND PARASITE EXAMINATION

## 2018-11-10 NOTE — Progress Notes (Signed)
Internal Medicine Clinic Attending  Case discussed with Dr. Prince at the time of the visit.  We reviewed the resident's history and exam and pertinent patient test results.  I agree with the assessment, diagnosis, and plan of care documented in the resident's note.   

## 2018-11-11 DIAGNOSIS — N61 Mastitis without abscess: Secondary | ICD-10-CM | POA: Diagnosis not present

## 2018-11-26 ENCOUNTER — Other Ambulatory Visit: Payer: Self-pay | Admitting: Internal Medicine

## 2018-11-26 ENCOUNTER — Encounter: Payer: Self-pay | Admitting: Internal Medicine

## 2018-11-26 DIAGNOSIS — E039 Hypothyroidism, unspecified: Secondary | ICD-10-CM

## 2018-11-27 ENCOUNTER — Other Ambulatory Visit: Payer: Self-pay | Admitting: *Deleted

## 2018-11-28 DIAGNOSIS — M62830 Muscle spasm of back: Secondary | ICD-10-CM | POA: Diagnosis not present

## 2018-11-28 DIAGNOSIS — M5414 Radiculopathy, thoracic region: Secondary | ICD-10-CM | POA: Diagnosis not present

## 2018-11-28 DIAGNOSIS — M9903 Segmental and somatic dysfunction of lumbar region: Secondary | ICD-10-CM | POA: Diagnosis not present

## 2018-11-28 DIAGNOSIS — M9901 Segmental and somatic dysfunction of cervical region: Secondary | ICD-10-CM | POA: Diagnosis not present

## 2018-11-28 DIAGNOSIS — M9902 Segmental and somatic dysfunction of thoracic region: Secondary | ICD-10-CM | POA: Diagnosis not present

## 2018-11-28 DIAGNOSIS — N6312 Unspecified lump in the right breast, upper inner quadrant: Secondary | ICD-10-CM | POA: Diagnosis not present

## 2018-11-28 DIAGNOSIS — G44219 Episodic tension-type headache, not intractable: Secondary | ICD-10-CM | POA: Diagnosis not present

## 2018-11-28 MED ORDER — ALPRAZOLAM 0.25 MG PO TABS: 0.2500 mg | ORAL_TABLET | Freq: Every evening | ORAL | 2 refills | Status: DC | PRN

## 2018-11-28 NOTE — Telephone Encounter (Signed)
levothyroxine (SYNTHROID, LEVOTHROID) 25 MCG tablet,  ALPRAZolam Duanne Moron) 0.25 MG tablet, refill request @  [place], [place] - [address] [phone] (Phone) [phone] (Fax)

## 2018-12-01 DIAGNOSIS — M9903 Segmental and somatic dysfunction of lumbar region: Secondary | ICD-10-CM | POA: Diagnosis not present

## 2018-12-01 DIAGNOSIS — M62830 Muscle spasm of back: Secondary | ICD-10-CM | POA: Diagnosis not present

## 2018-12-01 DIAGNOSIS — M9901 Segmental and somatic dysfunction of cervical region: Secondary | ICD-10-CM | POA: Diagnosis not present

## 2018-12-01 DIAGNOSIS — N6310 Unspecified lump in the right breast, unspecified quadrant: Secondary | ICD-10-CM | POA: Diagnosis not present

## 2018-12-01 DIAGNOSIS — M9902 Segmental and somatic dysfunction of thoracic region: Secondary | ICD-10-CM | POA: Diagnosis not present

## 2018-12-01 DIAGNOSIS — M5414 Radiculopathy, thoracic region: Secondary | ICD-10-CM | POA: Diagnosis not present

## 2018-12-01 DIAGNOSIS — G44219 Episodic tension-type headache, not intractable: Secondary | ICD-10-CM | POA: Diagnosis not present

## 2018-12-03 DIAGNOSIS — R590 Localized enlarged lymph nodes: Secondary | ICD-10-CM | POA: Diagnosis not present

## 2018-12-03 DIAGNOSIS — N6011 Diffuse cystic mastopathy of right breast: Secondary | ICD-10-CM | POA: Diagnosis not present

## 2018-12-05 DIAGNOSIS — M9901 Segmental and somatic dysfunction of cervical region: Secondary | ICD-10-CM | POA: Diagnosis not present

## 2018-12-05 DIAGNOSIS — M9903 Segmental and somatic dysfunction of lumbar region: Secondary | ICD-10-CM | POA: Diagnosis not present

## 2018-12-05 DIAGNOSIS — M5414 Radiculopathy, thoracic region: Secondary | ICD-10-CM | POA: Diagnosis not present

## 2018-12-05 DIAGNOSIS — M62830 Muscle spasm of back: Secondary | ICD-10-CM | POA: Diagnosis not present

## 2018-12-05 DIAGNOSIS — M9902 Segmental and somatic dysfunction of thoracic region: Secondary | ICD-10-CM | POA: Diagnosis not present

## 2018-12-05 DIAGNOSIS — G44219 Episodic tension-type headache, not intractable: Secondary | ICD-10-CM | POA: Diagnosis not present

## 2018-12-18 DIAGNOSIS — R05 Cough: Secondary | ICD-10-CM | POA: Diagnosis not present

## 2018-12-18 DIAGNOSIS — R6889 Other general symptoms and signs: Secondary | ICD-10-CM | POA: Diagnosis not present

## 2018-12-24 DIAGNOSIS — M9902 Segmental and somatic dysfunction of thoracic region: Secondary | ICD-10-CM | POA: Diagnosis not present

## 2018-12-24 DIAGNOSIS — G44219 Episodic tension-type headache, not intractable: Secondary | ICD-10-CM | POA: Diagnosis not present

## 2018-12-24 DIAGNOSIS — M5414 Radiculopathy, thoracic region: Secondary | ICD-10-CM | POA: Diagnosis not present

## 2018-12-24 DIAGNOSIS — M62830 Muscle spasm of back: Secondary | ICD-10-CM | POA: Diagnosis not present

## 2018-12-24 DIAGNOSIS — M9901 Segmental and somatic dysfunction of cervical region: Secondary | ICD-10-CM | POA: Diagnosis not present

## 2018-12-24 DIAGNOSIS — M9903 Segmental and somatic dysfunction of lumbar region: Secondary | ICD-10-CM | POA: Diagnosis not present

## 2018-12-29 DIAGNOSIS — M9901 Segmental and somatic dysfunction of cervical region: Secondary | ICD-10-CM | POA: Diagnosis not present

## 2018-12-29 DIAGNOSIS — M9902 Segmental and somatic dysfunction of thoracic region: Secondary | ICD-10-CM | POA: Diagnosis not present

## 2018-12-29 DIAGNOSIS — M62830 Muscle spasm of back: Secondary | ICD-10-CM | POA: Diagnosis not present

## 2018-12-29 DIAGNOSIS — M5414 Radiculopathy, thoracic region: Secondary | ICD-10-CM | POA: Diagnosis not present

## 2018-12-29 DIAGNOSIS — G44219 Episodic tension-type headache, not intractable: Secondary | ICD-10-CM | POA: Diagnosis not present

## 2018-12-29 DIAGNOSIS — M9903 Segmental and somatic dysfunction of lumbar region: Secondary | ICD-10-CM | POA: Diagnosis not present

## 2018-12-30 DIAGNOSIS — N644 Mastodynia: Secondary | ICD-10-CM | POA: Diagnosis not present

## 2019-01-05 ENCOUNTER — Other Ambulatory Visit: Payer: Self-pay | Admitting: Internal Medicine

## 2019-01-05 DIAGNOSIS — N951 Menopausal and female climacteric states: Secondary | ICD-10-CM

## 2019-01-05 DIAGNOSIS — E039 Hypothyroidism, unspecified: Secondary | ICD-10-CM

## 2019-01-05 DIAGNOSIS — R102 Pelvic and perineal pain: Secondary | ICD-10-CM

## 2019-01-05 DIAGNOSIS — N809 Endometriosis, unspecified: Secondary | ICD-10-CM

## 2019-01-05 NOTE — Telephone Encounter (Signed)
Needs refill on levothyroxine (SYNTHROID, LEVOTHROID) 25 MCG tablet traMADol (ULTRAM) 50 MG tablet estradiol (ESTRACE) 1 MG tablet [place], [place] - [address] ;pt contact [phone]

## 2019-01-06 ENCOUNTER — Other Ambulatory Visit: Payer: Self-pay | Admitting: Internal Medicine

## 2019-01-06 DIAGNOSIS — R102 Pelvic and perineal pain: Secondary | ICD-10-CM

## 2019-01-06 DIAGNOSIS — N809 Endometriosis, unspecified: Secondary | ICD-10-CM

## 2019-01-06 MED ORDER — ESTRADIOL 1 MG PO TABS: 1.0000 mg | ORAL_TABLET | Freq: Every day | ORAL | 11 refills | Status: DC

## 2019-01-06 MED ORDER — LEVOTHYROXINE SODIUM 25 MCG PO TABS: 25.0000 ug | ORAL_TABLET | Freq: Every day | ORAL | 0 refills | Status: DC

## 2019-01-06 NOTE — Telephone Encounter (Signed)
Last office visit: 11/03/17 [facility] Last UDS: N/A  Last Refill: 12/10/18 Next appt: none scheduled at this time

## 2019-01-06 NOTE — Telephone Encounter (Signed)
I did the other 2 Rx.  Can someone send in the tramadol for me?  I do not have my phone set up!  Thank you

## 2019-01-07 ENCOUNTER — Encounter: Payer: Self-pay | Admitting: Internal Medicine

## 2019-01-07 DIAGNOSIS — M9903 Segmental and somatic dysfunction of lumbar region: Secondary | ICD-10-CM | POA: Diagnosis not present

## 2019-01-07 DIAGNOSIS — M5136 Other intervertebral disc degeneration, lumbar region: Secondary | ICD-10-CM | POA: Diagnosis not present

## 2019-01-07 DIAGNOSIS — M9905 Segmental and somatic dysfunction of pelvic region: Secondary | ICD-10-CM | POA: Diagnosis not present

## 2019-01-07 DIAGNOSIS — M9904 Segmental and somatic dysfunction of sacral region: Secondary | ICD-10-CM | POA: Diagnosis not present

## 2019-01-07 NOTE — Telephone Encounter (Signed)
done

## 2019-01-08 NOTE — Telephone Encounter (Signed)
Donna Davis (Key: [id number] Received PA request from pt's pharmacy for tramadol-request submitted online via Cover My Meds for dx of pelvic pain.   This request has received a Favorable outcome.  Pharmacy aware.Regenia Skeeter, Darlene Cassady4/23/202011:08 AM

## 2019-01-21 ENCOUNTER — Ambulatory Visit (INDEPENDENT_AMBULATORY_CARE_PROVIDER_SITE_OTHER): Payer: BLUE CROSS/BLUE SHIELD | Admitting: Internal Medicine

## 2019-01-21 ENCOUNTER — Other Ambulatory Visit: Payer: Self-pay

## 2019-01-21 VITALS — BP 132/77 | HR 84 | Temp 98.5°F | Ht 60.0 in | Wt 181.2 lb

## 2019-01-21 DIAGNOSIS — Z87891 Personal history of nicotine dependence: Secondary | ICD-10-CM

## 2019-01-21 DIAGNOSIS — N61 Mastitis without abscess: Secondary | ICD-10-CM | POA: Insufficient documentation

## 2019-01-21 NOTE — Progress Notes (Signed)
   CC: Mastitis follow up  HPI: [name] is a 48 y.o.   Past Medical History:  Diagnosis Date  . Anxiety   . Endometriosis    s/p TAH, LSO  . Family history of malignant neoplasm of gastrointestinal tract   . GERD (gastroesophageal reflux disease)   . Hearing difficulty    b/l, 2/2 congenital rubella s/p stapedectomy. Using hearing aid  . Hepatic cyst    6 mm on CT done done in 05/2005  . Hypothyroidism   . IBS (irritable bowel syndrome)   . Migraines    "frequency depends on my stress level" (07/23/2017)  . PONV (postoperative nausea and vomiting)    "sometimes; depends on how long I'm under"  . Shingles 03/2013   Review of Systems: Denies fevers, chills, nausea, vomiting, lightheadedness, dizziness, or other symptoms.  Physical Exam:  Vitals:   01/21/19 1031  BP: 132/77  Pulse: 84  Temp: 98.5 F (36.9 C)  TempSrc: Oral  SpO2: 98%  Weight: 181 lb 3.2 oz (82.2 kg)  Height: 5' (1.524 m)  Physical Exam  Constitutional: She is well-developed, well-nourished, and in no distress.  HENT:  Head: Normocephalic and atraumatic.  Eyes: Pupils are equal, round, and reactive to light. Conjunctivae and EOM are normal.  Cardiovascular: Normal rate and regular rhythm.  Pulmonary/Chest: Effort normal and breath sounds normal.  No erythema or edema, no skin retraction, no dimpling, mild tenderness to palpation over right upper breast.  Abdominal: Soft. Bowel sounds are normal. She exhibits no distension.  Lymphadenopathy:    She has axillary adenopathy.       Right axillary: Pectoral (small ) adenopathy present.     Assessment & Plan:   See Encounters Tab for problem based charting.  Patient discussed with Dr. Beryle Beams

## 2019-01-21 NOTE — Assessment & Plan Note (Addendum)
Patient is here for follow-up of her mastitis.  She reports that she was living in [place] and has been traveling back and forth between [place] and [place].  On February 19 she woke up with a very high fever and was noted to have a red rash on the lower aspect of her right breast, she went to urgent care and was diagnosed with acute mastitis and given antibiotics.  She reports that these did not help and she went to go see a OB/GYN, she was given another dose of antibiotics and at that time was noted to have a swollen lymph node in her right axilla, she had a mammogram and ultrasound of her breast done that showed a suspected lesion at the 10 o'clock position 14 cm from the nipple representative of a cyst, as well as a lymph node, she was given a BI-RADS 2 and recommended a normal interval follow-up of 1 year.  Patient was still symptomatic when she followed up with the OB/GYN and given a mildly increased prolactin level at 23 she was referred for an MRI of the breast, she was unable to get this done due to insurance denial.  She denies any family history of breast cancer, had a hysterectomy about 12-15 years ago, denies any bleeding or vaginal issues.  She has had a history of mastitis in both breasts while she was breast-feeding.  Today patient reports that she is doing better, the redness has resolved and the lymph node swelling has improved.  She is still having some mild pain in her right axilla but states that it is overall improving.  On exam she has no redness or rash over her breast, there is a small tender lymph node in the right axilla.  Given the improvement of her symptoms and with antibiotics and relatively benign findings on mammogram and ultrasound it appears that this is likely a mastitis that has since resolved.  We will repeat a ultrasound in 6 months to follow-up.  I do not think that she requires a MRI at this time however if symptoms recur or develops other symptoms can consider  this.   -Follow-up ultrasound in 6 months -Advised patient to contact us if symptoms recur or she develops other issue

## 2019-01-21 NOTE — Patient Instructions (Signed)
[name],  It was a pleasure to see you today. Thank you for coming in.   Today we discussed your breast issues. It looks like it has been getting better, we think that this was likely hjust releated to the mastitis but we will likely to get another mammogram in 6 months to be sure. If you have any more issues or worsening symptoms please contact the clinic to set up another appointment.    Please return to clinic in 1-2 months to follow up with Dr. Daryll Drown, or sooner if needed.   Thank you again for coming in.   Asencion Noble.D.

## 2019-01-22 NOTE — Progress Notes (Signed)
Medicine attending: Medical history, presenting problems, physical findings, and medications, reviewed with resident physician Dr Lonia Skinner on the day of the patient visit and I concur with her evaluation and management plan. Follow up recent mastitis; initial evaluation done near her home in [place] S/P 2 sequential courses of antibiotics. Exam by Dr Sherry Ruffing today with complete resolution. Recent mammogram negative; rec was repeat in 1 year - we will get 6 month interval study to confirm regression of palpable axillary node likely inflammatory related to acute mastitis.

## 2019-01-30 ENCOUNTER — Encounter: Payer: Self-pay | Admitting: Internal Medicine

## 2019-02-02 ENCOUNTER — Other Ambulatory Visit: Payer: Self-pay | Admitting: Internal Medicine

## 2019-02-02 DIAGNOSIS — R5383 Other fatigue: Secondary | ICD-10-CM

## 2019-02-02 DIAGNOSIS — M9905 Segmental and somatic dysfunction of pelvic region: Secondary | ICD-10-CM | POA: Diagnosis not present

## 2019-02-02 DIAGNOSIS — K582 Mixed irritable bowel syndrome: Secondary | ICD-10-CM

## 2019-02-02 DIAGNOSIS — E038 Other specified hypothyroidism: Secondary | ICD-10-CM

## 2019-02-02 DIAGNOSIS — M9904 Segmental and somatic dysfunction of sacral region: Secondary | ICD-10-CM | POA: Diagnosis not present

## 2019-02-02 DIAGNOSIS — M5136 Other intervertebral disc degeneration, lumbar region: Secondary | ICD-10-CM | POA: Diagnosis not present

## 2019-02-02 DIAGNOSIS — M9903 Segmental and somatic dysfunction of lumbar region: Secondary | ICD-10-CM | POA: Diagnosis not present

## 2019-02-02 NOTE — Progress Notes (Signed)
Orders for blood work

## 2019-02-04 ENCOUNTER — Encounter: Payer: Self-pay | Admitting: Internal Medicine

## 2019-02-04 ENCOUNTER — Other Ambulatory Visit: Payer: Self-pay

## 2019-02-04 ENCOUNTER — Other Ambulatory Visit (INDEPENDENT_AMBULATORY_CARE_PROVIDER_SITE_OTHER): Payer: Medicare Other

## 2019-02-04 DIAGNOSIS — R5383 Other fatigue: Secondary | ICD-10-CM | POA: Diagnosis not present

## 2019-02-04 DIAGNOSIS — N809 Endometriosis, unspecified: Secondary | ICD-10-CM

## 2019-02-04 DIAGNOSIS — K582 Mixed irritable bowel syndrome: Secondary | ICD-10-CM

## 2019-02-04 DIAGNOSIS — E038 Other specified hypothyroidism: Secondary | ICD-10-CM | POA: Diagnosis not present

## 2019-02-04 DIAGNOSIS — R102 Pelvic and perineal pain: Secondary | ICD-10-CM

## 2019-02-04 MED ORDER — TRAMADOL HCL 50 MG PO TABS: ORAL_TABLET | ORAL | 0 refills | Status: DC

## 2019-02-05 LAB — CBC WITH DIFFERENTIAL/PLATELET
Basophils Absolute: 0 10*3/uL (ref 0.0–0.2)
Basos: 0 %
EOS (ABSOLUTE): 0.2 10*3/uL (ref 0.0–0.4)
Eos: 2 %
Hematocrit: 40.9 % (ref 34.0–46.6)
Hemoglobin: 14.1 g/dL (ref 11.1–15.9)
Immature Grans (Abs): 0 10*3/uL (ref 0.0–0.1)
Immature Granulocytes: 0 %
Lymphocytes Absolute: 2.8 10*3/uL (ref 0.7–3.1)
Lymphs: 31 %
MCH: 31.2 pg (ref 26.6–33.0)
MCHC: 34.5 g/dL (ref 31.5–35.7)
MCV: 91 fL (ref 79–97)
Monocytes Absolute: 0.5 10*3/uL (ref 0.1–0.9)
Monocytes: 6 %
Neutrophils Absolute: 5.5 10*3/uL (ref 1.4–7.0)
Neutrophils: 61 %
Platelets: 321 10*3/uL (ref 150–450)
RBC: 4.52 x10E6/uL (ref 3.77–5.28)
RDW: 12.6 % (ref 11.7–15.4)
WBC: 9.1 10*3/uL (ref 3.4–10.8)

## 2019-02-05 LAB — BMP8+ANION GAP
Anion Gap: 18 mmol/L (ref 10.0–18.0)
BUN/Creatinine Ratio: 22 (ref 9–23)
BUN: 20 mg/dL (ref 6–24)
CO2: 23 mmol/L (ref 20–29)
Calcium: 9.9 mg/dL (ref 8.7–10.2)
Chloride: 98 mmol/L (ref 96–106)
Creatinine, Ser: 0.89 mg/dL (ref 0.57–1.00)
GFR calc Af Amer: 87 mL/min/{1.73_m2} (ref >59)
GFR calc non Af Amer: 76 mL/min/{1.73_m2} (ref >59)
Glucose: 78 mg/dL (ref 65–99)
Potassium: 5.2 mmol/L (ref 3.5–5.2)
Sodium: 139 mmol/L (ref 134–144)

## 2019-02-05 LAB — TSH: TSH: 2.62 u[IU]/mL (ref 0.450–4.500)

## 2019-02-11 ENCOUNTER — Encounter: Payer: Self-pay | Admitting: Internal Medicine

## 2019-02-16 MED ORDER — BUSPIRONE HCL 10 MG PO TABS: 5.0000 mg | ORAL_TABLET | Freq: Three times a day (TID) | ORAL | 3 refills | Status: DC

## 2019-02-26 DIAGNOSIS — M5136 Other intervertebral disc degeneration, lumbar region: Secondary | ICD-10-CM | POA: Diagnosis not present

## 2019-02-26 DIAGNOSIS — M9905 Segmental and somatic dysfunction of pelvic region: Secondary | ICD-10-CM | POA: Diagnosis not present

## 2019-02-26 DIAGNOSIS — M9904 Segmental and somatic dysfunction of sacral region: Secondary | ICD-10-CM | POA: Diagnosis not present

## 2019-02-26 DIAGNOSIS — M9903 Segmental and somatic dysfunction of lumbar region: Secondary | ICD-10-CM | POA: Diagnosis not present

## 2019-02-28 ENCOUNTER — Encounter: Payer: Self-pay | Admitting: Internal Medicine

## 2019-03-02 DIAGNOSIS — M9905 Segmental and somatic dysfunction of pelvic region: Secondary | ICD-10-CM | POA: Diagnosis not present

## 2019-03-02 DIAGNOSIS — M9903 Segmental and somatic dysfunction of lumbar region: Secondary | ICD-10-CM | POA: Diagnosis not present

## 2019-03-02 DIAGNOSIS — M9904 Segmental and somatic dysfunction of sacral region: Secondary | ICD-10-CM | POA: Diagnosis not present

## 2019-03-02 DIAGNOSIS — M5136 Other intervertebral disc degeneration, lumbar region: Secondary | ICD-10-CM | POA: Diagnosis not present

## 2019-03-02 MED ORDER — ALPRAZOLAM 0.25 MG PO TABS: 0.2500 mg | ORAL_TABLET | Freq: Every evening | ORAL | 2 refills | Status: DC | PRN

## 2019-03-05 ENCOUNTER — Encounter: Payer: Self-pay | Admitting: Internal Medicine

## 2019-03-09 ENCOUNTER — Other Ambulatory Visit: Payer: Self-pay | Admitting: Internal Medicine

## 2019-03-09 DIAGNOSIS — N809 Endometriosis, unspecified: Secondary | ICD-10-CM

## 2019-03-09 DIAGNOSIS — R102 Pelvic and perineal pain: Secondary | ICD-10-CM

## 2019-03-09 MED ORDER — BUSPIRONE HCL 10 MG PO TABS: ORAL_TABLET | ORAL | 3 refills | Status: DC

## 2019-03-09 MED ORDER — TRAMADOL HCL 50 MG PO TABS: ORAL_TABLET | ORAL | 3 refills | Status: DC

## 2019-03-19 ENCOUNTER — Encounter: Payer: Self-pay | Admitting: Internal Medicine

## 2019-03-23 ENCOUNTER — Other Ambulatory Visit: Payer: Self-pay | Admitting: Internal Medicine

## 2019-03-23 MED ORDER — BUSPIRONE HCL 15 MG PO TABS: ORAL_TABLET | ORAL | 5 refills | Status: DC

## 2019-03-31 ENCOUNTER — Encounter: Payer: Self-pay | Admitting: Internal Medicine

## 2019-04-08 NOTE — Telephone Encounter (Signed)
Spoke with the pt.  She has been sch on 04/09/2019 with [facility].

## 2019-04-09 ENCOUNTER — Other Ambulatory Visit: Payer: Self-pay

## 2019-04-09 ENCOUNTER — Ambulatory Visit (INDEPENDENT_AMBULATORY_CARE_PROVIDER_SITE_OTHER): Payer: BC Managed Care – PPO | Admitting: Internal Medicine

## 2019-04-09 VITALS — BP 118/67 | HR 77 | Temp 98.1°F | Ht 60.0 in | Wt 186.7 lb

## 2019-04-09 DIAGNOSIS — Z87891 Personal history of nicotine dependence: Secondary | ICD-10-CM

## 2019-04-09 DIAGNOSIS — R29898 Other symptoms and signs involving the musculoskeletal system: Secondary | ICD-10-CM | POA: Diagnosis not present

## 2019-04-09 DIAGNOSIS — R208 Other disturbances of skin sensation: Secondary | ICD-10-CM

## 2019-04-09 DIAGNOSIS — M79641 Pain in right hand: Secondary | ICD-10-CM | POA: Diagnosis not present

## 2019-04-09 DIAGNOSIS — R531 Weakness: Secondary | ICD-10-CM

## 2019-04-09 DIAGNOSIS — E038 Other specified hypothyroidism: Secondary | ICD-10-CM | POA: Diagnosis not present

## 2019-04-09 DIAGNOSIS — G629 Polyneuropathy, unspecified: Secondary | ICD-10-CM | POA: Diagnosis not present

## 2019-04-09 DIAGNOSIS — R202 Paresthesia of skin: Secondary | ICD-10-CM | POA: Diagnosis not present

## 2019-04-09 NOTE — Assessment & Plan Note (Signed)
On physical exam she had weakness in her hand with grip strength and finger abduction. Sensation mostly intact, she does endorse some decreased sensation over the dorsal aspect of the ring finger. There are no skin changes except for peeling of dead skin from the tips of the 3rd and 4th digit. Neurological exam wnl. She has not changed any medications or had any trauma. She has no neck pain. Spurlings negative.  Discussed case with poison control and they believe her symptoms inconsistent with what the product would cause for skin exposure. The contents of the cleaning product include 3% hydrogen peroxide and soap that would typically be found in dish soap. With her symptoms being localized to the hand I would doubt cervical etiology. That her symptoms are also found in only her right hand make other chemical/immunological abnormalities unlikely. TSH in may was normal but will repeat today along with other labs.   - CBC, TSH, CMP  - f/u one week if symptoms do not improve

## 2019-04-09 NOTE — Patient Instructions (Signed)
Thank you for allowing [place] to provide your care today. Today we discussed your weakness and throbbing of your right hand.     I have ordered the following labs for you:  Complete blood count, basic metabolic panel, and TSH    I will call if any are abnormal.    Today we made the following changes to your medications:   Please follow-up in one week to assess symptoms.    Should you have any questions or concerns please call the internal medicine clinic at [phone].

## 2019-04-09 NOTE — Progress Notes (Signed)
   CC: right hand throbbing   HPI:  [name] is a 48 y.o. with PMH as below presenting with right hand pain and throbbing for the past week. Three weeks ago she was cleaning with the product Kaboom without gloves for an extended period and noticed that the middle finger of her right hand was burning had turned white just proximal to the DIP. Her 2nd, 3rd, and 4th fingers subsequently began to hurt and turned red and blistered and she developed throbbing in her entire hand. Later that evening the pain was very bad. She became nauseous and vomited a small amount. There were no other associated symptoms. Her hand began to heal but continued to hurt. About 1 week ago she began waking up out of sleep with her hand cramping and feeling as if she was developing weakness in her hand. The area of pain includes the entire hand, and she continues to have throbbing. The symptoms do not extend past the hand and her left hand feels completely normal. She denies numbness, dizziness, changes in vision, or confusion at the time of the event or recently. Her hand has continued to heal and she states looks almost back to normal but feels very weak and movement exacerbates the pain. This is interfering with her job as she is deaf and uses sign language as part of her work. She has tried her tramadol and also tylenol for the pain. The tramadol helped slightly but did not take the pain away.   Please see A&P for assessment of the patient's acute and chronic medical conditions.    Past Medical History:  Diagnosis Date  . Anxiety   . Endometriosis    s/p TAH, LSO  . Family history of malignant neoplasm of gastrointestinal tract   . GERD (gastroesophageal reflux disease)   . Hearing difficulty    b/l, 2/2 congenital rubella s/p stapedectomy. Using hearing aid  . Hepatic cyst    6 mm on CT done done in 05/2005  . Hypothyroidism   . IBS (irritable bowel syndrome)   . Migraines    "frequency depends on my stress  level" (07/23/2017)  . PONV (postoperative nausea and vomiting)    "sometimes; depends on how long I'm under"  . Shingles 03/2013   Review of Systems:   Review of Systems  Constitutional: Negative for diaphoresis and fever.  Respiratory: Negative for cough, shortness of breath and wheezing.   Musculoskeletal: Negative for joint pain and myalgias.  Skin: Negative for itching and rash.  Neurological: Positive for tingling, sensory change and focal weakness. Negative for dizziness, tremors and headaches.   Physical Exam:  Constitution: NAD, healthy appearing female  Eyes: eom intact, PERRLA MSK: UE hand strength left 3/5 grip and finger abduction; right 5/5; arm strength 5/5; sensation decreased dorsal aspect 4th digit, otherwise intact  Neuro: alert & oriented, pleasant, CN II-XII grossly intact Skin: 3rd and 4th digit fingertip basal layer epidermis exposed, no abrasion or erythema or skin discoloration   Vitals:   04/09/19 1429  BP: 118/67  Pulse: 77  Temp: 98.1 F (36.7 C)  TempSrc: Oral  SpO2: 99%  Weight: 186 lb 11.2 oz (84.7 kg)  Height: 5' (1.524 m)     Assessment & Plan:   See Encounters Tab for problem based charting.  Patient discussed with Dr. Rebeca Alert

## 2019-04-10 ENCOUNTER — Encounter: Payer: Self-pay | Admitting: Internal Medicine

## 2019-04-10 LAB — CMP14 + ANION GAP
ALT: 17 IU/L (ref 0–32)
AST: 15 IU/L (ref 0–40)
Albumin/Globulin Ratio: 1.7 (ref 1.2–2.2)
Albumin: 4.3 g/dL (ref 3.8–4.8)
Alkaline Phosphatase: 58 IU/L (ref 39–117)
Anion Gap: 18 mmol/L (ref 10.0–18.0)
BUN/Creatinine Ratio: 14 (ref 9–23)
BUN: 11 mg/dL (ref 6–24)
Bilirubin Total: 0.2 mg/dL (ref 0.0–1.2)
CO2: 23 mmol/L (ref 20–29)
Calcium: 9.6 mg/dL (ref 8.7–10.2)
Chloride: 99 mmol/L (ref 96–106)
Creatinine, Ser: 0.79 mg/dL (ref 0.57–1.00)
GFR calc Af Amer: 101 mL/min/{1.73_m2} (ref >59)
GFR calc non Af Amer: 88 mL/min/{1.73_m2} (ref >59)
Globulin, Total: 2.5 g/dL (ref 1.5–4.5)
Glucose: 96 mg/dL (ref 65–99)
Potassium: 4.5 mmol/L (ref 3.5–5.2)
Sodium: 140 mmol/L (ref 134–144)
Total Protein: 6.8 g/dL (ref 6.0–8.5)

## 2019-04-10 LAB — CBC
Hematocrit: 37.8 % (ref 34.0–46.6)
Hemoglobin: 13 g/dL (ref 11.1–15.9)
MCH: 31.6 pg (ref 26.6–33.0)
MCHC: 34.4 g/dL (ref 31.5–35.7)
MCV: 92 fL (ref 79–97)
Platelets: 300 10*3/uL (ref 150–450)
RBC: 4.12 x10E6/uL (ref 3.77–5.28)
RDW: 12 % (ref 11.7–15.4)
WBC: 7.9 10*3/uL (ref 3.4–10.8)

## 2019-04-10 LAB — TSH: TSH: 2.15 u[IU]/mL (ref 0.450–4.500)

## 2019-04-13 ENCOUNTER — Ambulatory Visit: Payer: Medicare Other

## 2019-04-14 DIAGNOSIS — M9903 Segmental and somatic dysfunction of lumbar region: Secondary | ICD-10-CM | POA: Diagnosis not present

## 2019-04-14 DIAGNOSIS — M5136 Other intervertebral disc degeneration, lumbar region: Secondary | ICD-10-CM | POA: Diagnosis not present

## 2019-04-14 DIAGNOSIS — M50322 Other cervical disc degeneration at C5-C6 level: Secondary | ICD-10-CM | POA: Diagnosis not present

## 2019-04-14 DIAGNOSIS — M9901 Segmental and somatic dysfunction of cervical region: Secondary | ICD-10-CM | POA: Diagnosis not present

## 2019-04-16 DIAGNOSIS — M5136 Other intervertebral disc degeneration, lumbar region: Secondary | ICD-10-CM | POA: Diagnosis not present

## 2019-04-16 DIAGNOSIS — M9903 Segmental and somatic dysfunction of lumbar region: Secondary | ICD-10-CM | POA: Diagnosis not present

## 2019-04-16 DIAGNOSIS — M9901 Segmental and somatic dysfunction of cervical region: Secondary | ICD-10-CM | POA: Diagnosis not present

## 2019-04-16 DIAGNOSIS — M50322 Other cervical disc degeneration at C5-C6 level: Secondary | ICD-10-CM | POA: Diagnosis not present

## 2019-04-20 DIAGNOSIS — M5136 Other intervertebral disc degeneration, lumbar region: Secondary | ICD-10-CM | POA: Diagnosis not present

## 2019-04-20 DIAGNOSIS — M9901 Segmental and somatic dysfunction of cervical region: Secondary | ICD-10-CM | POA: Diagnosis not present

## 2019-04-20 DIAGNOSIS — M9903 Segmental and somatic dysfunction of lumbar region: Secondary | ICD-10-CM | POA: Diagnosis not present

## 2019-04-20 DIAGNOSIS — M50322 Other cervical disc degeneration at C5-C6 level: Secondary | ICD-10-CM | POA: Diagnosis not present

## 2019-04-21 DIAGNOSIS — M5136 Other intervertebral disc degeneration, lumbar region: Secondary | ICD-10-CM | POA: Diagnosis not present

## 2019-04-21 DIAGNOSIS — M50322 Other cervical disc degeneration at C5-C6 level: Secondary | ICD-10-CM | POA: Diagnosis not present

## 2019-04-21 DIAGNOSIS — M9903 Segmental and somatic dysfunction of lumbar region: Secondary | ICD-10-CM | POA: Diagnosis not present

## 2019-04-21 DIAGNOSIS — M9901 Segmental and somatic dysfunction of cervical region: Secondary | ICD-10-CM | POA: Diagnosis not present

## 2019-04-22 ENCOUNTER — Other Ambulatory Visit: Payer: Self-pay | Admitting: Internal Medicine

## 2019-04-22 DIAGNOSIS — M50322 Other cervical disc degeneration at C5-C6 level: Secondary | ICD-10-CM | POA: Diagnosis not present

## 2019-04-22 DIAGNOSIS — M9901 Segmental and somatic dysfunction of cervical region: Secondary | ICD-10-CM | POA: Diagnosis not present

## 2019-04-22 DIAGNOSIS — M9903 Segmental and somatic dysfunction of lumbar region: Secondary | ICD-10-CM | POA: Diagnosis not present

## 2019-04-22 DIAGNOSIS — M5136 Other intervertebral disc degeneration, lumbar region: Secondary | ICD-10-CM | POA: Diagnosis not present

## 2019-04-23 DIAGNOSIS — M9903 Segmental and somatic dysfunction of lumbar region: Secondary | ICD-10-CM | POA: Diagnosis not present

## 2019-04-23 DIAGNOSIS — M5136 Other intervertebral disc degeneration, lumbar region: Secondary | ICD-10-CM | POA: Diagnosis not present

## 2019-04-23 DIAGNOSIS — M9904 Segmental and somatic dysfunction of sacral region: Secondary | ICD-10-CM | POA: Diagnosis not present

## 2019-04-23 DIAGNOSIS — M9905 Segmental and somatic dysfunction of pelvic region: Secondary | ICD-10-CM | POA: Diagnosis not present

## 2019-04-23 NOTE — Telephone Encounter (Signed)
Called pt - stated she has a yeast infection, stated she has had this several times before. C/o white discharge, itching and an odor. Thanks

## 2019-04-23 NOTE — Telephone Encounter (Signed)
Can someone call her and ask why she needs this medication?  Thanks

## 2019-04-24 NOTE — Progress Notes (Signed)
Internal Medicine Clinic Attending  Case discussed with Dr. Seawell at the time of the visit.  We reviewed the resident's history and exam and pertinent patient test results.  I agree with the assessment, diagnosis, and plan of care documented in the resident's note.  Alexander Raines, M.D., Ph.D.  

## 2019-04-26 DIAGNOSIS — K047 Periapical abscess without sinus: Secondary | ICD-10-CM | POA: Diagnosis not present

## 2019-04-27 DIAGNOSIS — M9904 Segmental and somatic dysfunction of sacral region: Secondary | ICD-10-CM | POA: Diagnosis not present

## 2019-04-27 DIAGNOSIS — M5136 Other intervertebral disc degeneration, lumbar region: Secondary | ICD-10-CM | POA: Diagnosis not present

## 2019-04-27 DIAGNOSIS — M9903 Segmental and somatic dysfunction of lumbar region: Secondary | ICD-10-CM | POA: Diagnosis not present

## 2019-04-27 DIAGNOSIS — M9905 Segmental and somatic dysfunction of pelvic region: Secondary | ICD-10-CM | POA: Diagnosis not present

## 2019-04-30 DIAGNOSIS — M5136 Other intervertebral disc degeneration, lumbar region: Secondary | ICD-10-CM | POA: Diagnosis not present

## 2019-04-30 DIAGNOSIS — M9905 Segmental and somatic dysfunction of pelvic region: Secondary | ICD-10-CM | POA: Diagnosis not present

## 2019-04-30 DIAGNOSIS — M9903 Segmental and somatic dysfunction of lumbar region: Secondary | ICD-10-CM | POA: Diagnosis not present

## 2019-04-30 DIAGNOSIS — M9904 Segmental and somatic dysfunction of sacral region: Secondary | ICD-10-CM | POA: Diagnosis not present

## 2019-05-16 ENCOUNTER — Other Ambulatory Visit: Payer: Self-pay | Admitting: Internal Medicine

## 2019-05-16 DIAGNOSIS — K219 Gastro-esophageal reflux disease without esophagitis: Secondary | ICD-10-CM

## 2019-05-20 ENCOUNTER — Encounter: Payer: Self-pay | Admitting: Internal Medicine

## 2019-05-20 ENCOUNTER — Ambulatory Visit (INDEPENDENT_AMBULATORY_CARE_PROVIDER_SITE_OTHER): Payer: BC Managed Care – PPO | Admitting: Internal Medicine

## 2019-05-20 ENCOUNTER — Ambulatory Visit (HOSPITAL_COMMUNITY)
Admission: RE | Admit: 2019-05-20 | Discharge: 2019-05-20 | Disposition: A | Discharge status: Home / Self Care | Payer: BC Managed Care – PPO | Source: Ambulatory Visit | Attending: Internal Medicine | Admitting: Internal Medicine

## 2019-05-20 ENCOUNTER — Other Ambulatory Visit: Payer: Self-pay

## 2019-05-20 VITALS — BP 124/66 | HR 87 | Temp 99.5°F | Ht 60.0 in | Wt 183.9 lb

## 2019-05-20 DIAGNOSIS — R110 Nausea: Secondary | ICD-10-CM | POA: Diagnosis not present

## 2019-05-20 DIAGNOSIS — Z90710 Acquired absence of both cervix and uterus: Secondary | ICD-10-CM

## 2019-05-20 DIAGNOSIS — Z9049 Acquired absence of other specified parts of digestive tract: Secondary | ICD-10-CM | POA: Diagnosis not present

## 2019-05-20 DIAGNOSIS — R1084 Generalized abdominal pain: Secondary | ICD-10-CM | POA: Insufficient documentation

## 2019-05-20 DIAGNOSIS — Z87891 Personal history of nicotine dependence: Secondary | ICD-10-CM | POA: Diagnosis not present

## 2019-05-20 DIAGNOSIS — R1010 Upper abdominal pain, unspecified: Secondary | ICD-10-CM

## 2019-05-20 DIAGNOSIS — R1031 Right lower quadrant pain: Secondary | ICD-10-CM | POA: Diagnosis not present

## 2019-05-20 DIAGNOSIS — Z8742 Personal history of other diseases of the female genital tract: Secondary | ICD-10-CM | POA: Diagnosis not present

## 2019-05-20 DIAGNOSIS — H9193 Unspecified hearing loss, bilateral: Secondary | ICD-10-CM | POA: Diagnosis not present

## 2019-05-20 DIAGNOSIS — R1033 Periumbilical pain: Secondary | ICD-10-CM | POA: Diagnosis not present

## 2019-05-20 DIAGNOSIS — E039 Hypothyroidism, unspecified: Secondary | ICD-10-CM

## 2019-05-20 DIAGNOSIS — K589 Irritable bowel syndrome without diarrhea: Secondary | ICD-10-CM | POA: Diagnosis not present

## 2019-05-20 LAB — COMPREHENSIVE METABOLIC PANEL
ALT: 19 U/L (ref 0–44)
AST: 20 U/L (ref 15–41)
Albumin: 4 g/dL (ref 3.5–5.0)
Alkaline Phosphatase: 40 U/L (ref 38–126)
Anion gap: 13 (ref 5–15)
BUN: 9 mg/dL (ref 6–20)
CO2: 23 mmol/L (ref 22–32)
Calcium: 9.4 mg/dL (ref 8.9–10.3)
Chloride: 103 mmol/L (ref 98–111)
Creatinine, Ser: 0.77 mg/dL (ref 0.44–1.00)
GFR calc Af Amer: >60 mL/min (ref >60)
GFR calc non Af Amer: >60 mL/min (ref >60)
Glucose, Bld: 93 mg/dL (ref 70–99)
Potassium: 4.6 mmol/L (ref 3.5–5.1)
Sodium: 139 mmol/L (ref 135–145)
Total Bilirubin: 0.5 mg/dL (ref 0.3–1.2)
Total Protein: 6.9 g/dL (ref 6.5–8.1)

## 2019-05-20 LAB — LIPASE, BLOOD: Lipase: 40 U/L (ref 11–51)

## 2019-05-20 MED ORDER — IOHEXOL 300 MG/ML  SOLN
100.0000 mL | Freq: Once | INTRAMUSCULAR | Status: AC | PRN
  Administered 2019-05-20: 100 mL via INTRAVENOUS

## 2019-05-20 MED ORDER — PROMETHAZINE HCL 12.5 MG PO TABS: 12.5000 mg | ORAL_TABLET | Freq: Four times a day (QID) | ORAL | 0 refills | Status: DC | PRN

## 2019-05-20 MED ORDER — IOHEXOL 300 MG/ML  SOLN
30.0000 mL | Freq: Once | INTRAMUSCULAR | Status: AC | PRN
  Administered 2019-05-20: 18:00:00 30 mL via ORAL

## 2019-05-20 MED ORDER — SODIUM CHLORIDE (PF) 0.9 % IJ SOLN
INTRAMUSCULAR | Status: AC
  Filled 2019-05-20: qty 50

## 2019-05-20 NOTE — Progress Notes (Signed)
   CC: Abdominal pain and nausea  HPI:  [name] is a 48 y.o. female with IBS, hypothyroidism, bilateral hearing difficulties who presented with abdominal pain and vomiting. Please see problem based charting for evaluation, assessment, and plan.  Past Medical History:  Diagnosis Date  . Anxiety   . Endometriosis    s/p TAH, LSO  . Family history of malignant neoplasm of gastrointestinal tract   . GERD (gastroesophageal reflux disease)   . Hearing difficulty    b/l, 2/2 congenital rubella s/p stapedectomy. Using hearing aid  . Hepatic cyst    6 mm on CT done done in 05/2005  . Hypothyroidism   . IBS (irritable bowel syndrome)   . Migraines    "frequency depends on my stress level" (07/23/2017)  . PONV (postoperative nausea and vomiting)    "sometimes; depends on how long I'm under"  . Shingles 03/2013   Review of Systems:    Review of Systems  Constitutional: Positive for chills and fever.  Cardiovascular: Negative for chest pain.  Gastrointestinal: Positive for abdominal pain, nausea and vomiting. Negative for diarrhea.  Neurological: Negative for dizziness and headaches.   Physical Exam:  Vitals:   05/20/19 1312  BP: 124/66  Pulse: 87  Temp: 99.5 F (37.5 C)  TempSrc: Oral  SpO2: 100%  Weight: 183 lb 14.4 oz (83.4 kg)  Height: 5' (1.524 m)   Physical Exam  Constitutional: She is oriented to person, place, and time. She appears well-developed and well-nourished. She appears distressed.  HENT:  Head: Normocephalic.  Eyes: Conjunctivae are normal.  Cardiovascular: Normal rate, regular rhythm and normal heart sounds.  Respiratory: Effort normal and breath sounds normal. No respiratory distress. She has no wheezes.  GI: Soft. Bowel sounds are normal. She exhibits no distension and no mass. There is abdominal tenderness (upper abdomen, right lower quadrant, periumbillical region). There is rebound and guarding.  mcburney tenderness, rebound tenderness   Musculoskeletal:        General: No edema.  Neurological: She is alert and oriented to person, place, and time.  Skin: She is not diaphoretic. No erythema.  Psychiatric: She has a normal mood and affect. Her behavior is normal. Judgment and thought content normal.   Assessment & Plan:   See Encounters Tab for problem based charting.  Patient discussed with Dr. Angelia Mould

## 2019-05-20 NOTE — Addendum Note (Signed)
Addended by: Lars Mage on: 05/20/2019 07:46 PM   Modules accepted: Orders

## 2019-05-20 NOTE — Assessment & Plan Note (Addendum)
Patient states that she has been having one day history of abdominal pain after eating 3 eggs yesterday morning. She said she started having abdominal pain 15 minutes after she ate the eggs and developed nausea 30 minutes after eating. Patient describes the abdominal pain as 9/10 intensity, constantly present, throbbing in nature with occasionally being sharp in nature, worsened by eating. The pain is present over upper abdomen, right lower quadrant, and periumbilical region. She vomited 3 times so far. She has used phenergan which helped with nausea.   She has accompanied subjective fevers and chills. Denies diarrhea.  Been having regular bowel movements, without any changes in stool consistency.  She states that her color of her stool is green and claylike in texture.  Assessment and plan  Patient has peritoneal signs and symptoms and rebound tenderness.  She stated that she felt every bump on the road during her car ride to the clinic.  Given that the patient's signs and symptoms are acute in nature is worrisome for possible gastrointestinal infection, food borne illness, pancreatitis.she has a history of endometrial-itis and had a hysterectomy done for that.  She is not having any vaginal bleeding at this time.  She has had a prior cholecystectomy.  The patient has had bowel movements is less likely that she has obstruction.  -CMP -Lipase -CT abdomen with contrast -recommended patient eat bland food and hydrate well  -phenergan for nausea

## 2019-05-20 NOTE — Patient Instructions (Signed)
It was a pleasure to see you today Ms. Procell. Please make the following changes:   To hear that she.  We have checked labs during this visit and I have also ordered for a CT of your abdomen to evaluate possible cause of your symptoms. I will call you with your results.  If you have any questions or concerns, please call our clinic at [phone] between 9am-5pm and after hours call [phone] and ask for the internal medicine resident on call. If you feel you are having a medical emergency please call 911.   Thank you, we look forward to help you remain healthy!  Lars Mage, MD Internal Medicine PGY3

## 2019-05-26 NOTE — Progress Notes (Signed)
Internal Medicine Clinic Attending  Case discussed with Dr. Chundi at the time of the visit.  We reviewed the resident's history and exam and pertinent patient test results.  I agree with the assessment, diagnosis, and plan of care documented in the resident's note. 

## 2019-05-29 ENCOUNTER — Encounter: Payer: Self-pay | Admitting: Internal Medicine

## 2019-05-29 ENCOUNTER — Other Ambulatory Visit: Payer: Self-pay | Admitting: Internal Medicine

## 2019-05-29 ENCOUNTER — Other Ambulatory Visit: Payer: Self-pay | Admitting: *Deleted

## 2019-05-29 MED ORDER — ALPRAZOLAM 0.25 MG PO TABS: 0.2500 mg | ORAL_TABLET | Freq: Every evening | ORAL | 2 refills | Status: DC | PRN

## 2019-06-04 DIAGNOSIS — M9905 Segmental and somatic dysfunction of pelvic region: Secondary | ICD-10-CM | POA: Diagnosis not present

## 2019-06-04 DIAGNOSIS — M5136 Other intervertebral disc degeneration, lumbar region: Secondary | ICD-10-CM | POA: Diagnosis not present

## 2019-06-04 DIAGNOSIS — M9904 Segmental and somatic dysfunction of sacral region: Secondary | ICD-10-CM | POA: Diagnosis not present

## 2019-06-04 DIAGNOSIS — M9903 Segmental and somatic dysfunction of lumbar region: Secondary | ICD-10-CM | POA: Diagnosis not present

## 2019-06-23 ENCOUNTER — Encounter: Payer: Self-pay | Admitting: Internal Medicine

## 2019-06-23 DIAGNOSIS — R1084 Generalized abdominal pain: Secondary | ICD-10-CM

## 2019-07-03 ENCOUNTER — Encounter: Payer: Self-pay | Admitting: Internal Medicine

## 2019-07-03 ENCOUNTER — Other Ambulatory Visit (INDEPENDENT_AMBULATORY_CARE_PROVIDER_SITE_OTHER): Payer: BC Managed Care – PPO

## 2019-07-03 ENCOUNTER — Other Ambulatory Visit: Payer: Self-pay

## 2019-07-03 DIAGNOSIS — R1084 Generalized abdominal pain: Secondary | ICD-10-CM

## 2019-07-04 LAB — CBC WITH DIFFERENTIAL/PLATELET
Basophils Absolute: 0 10*3/uL (ref 0.0–0.2)
Basos: 1 %
EOS (ABSOLUTE): 0.3 10*3/uL (ref 0.0–0.4)
Eos: 4 %
Hematocrit: 40.6 % (ref 34.0–46.6)
Hemoglobin: 14 g/dL (ref 11.1–15.9)
Immature Grans (Abs): 0 10*3/uL (ref 0.0–0.1)
Immature Granulocytes: 0 %
Lymphocytes Absolute: 2.7 10*3/uL (ref 0.7–3.1)
Lymphs: 39 %
MCH: 31.6 pg (ref 26.6–33.0)
MCHC: 34.5 g/dL (ref 31.5–35.7)
MCV: 92 fL (ref 79–97)
Monocytes Absolute: 0.6 10*3/uL (ref 0.1–0.9)
Monocytes: 8 %
Neutrophils Absolute: 3.3 10*3/uL (ref 1.4–7.0)
Neutrophils: 48 %
Platelets: 284 10*3/uL (ref 150–450)
RBC: 4.43 x10E6/uL (ref 3.77–5.28)
RDW: 12.2 % (ref 11.7–15.4)
WBC: 6.8 10*3/uL (ref 3.4–10.8)

## 2019-07-04 LAB — CMP14 + ANION GAP
ALT: 11 IU/L (ref 0–32)
AST: 14 IU/L (ref 0–40)
Albumin/Globulin Ratio: 1.9 (ref 1.2–2.2)
Albumin: 4.4 g/dL (ref 3.8–4.8)
Alkaline Phosphatase: 49 IU/L (ref 39–117)
Anion Gap: 14 mmol/L (ref 10.0–18.0)
BUN/Creatinine Ratio: 16 (ref 9–23)
BUN: 11 mg/dL (ref 6–24)
Bilirubin Total: <0.2 mg/dL (ref 0.0–1.2)
CO2: 23 mmol/L (ref 20–29)
Calcium: 9.6 mg/dL (ref 8.7–10.2)
Chloride: 103 mmol/L (ref 96–106)
Creatinine, Ser: 0.68 mg/dL (ref 0.57–1.00)
GFR calc Af Amer: 118 mL/min/{1.73_m2} (ref >59)
GFR calc non Af Amer: 102 mL/min/{1.73_m2} (ref >59)
Globulin, Total: 2.3 g/dL (ref 1.5–4.5)
Glucose: 88 mg/dL (ref 65–99)
Potassium: 5.2 mmol/L (ref 3.5–5.2)
Sodium: 140 mmol/L (ref 134–144)
Total Protein: 6.7 g/dL (ref 6.0–8.5)

## 2019-07-07 ENCOUNTER — Ambulatory Visit (INDEPENDENT_AMBULATORY_CARE_PROVIDER_SITE_OTHER): Payer: BC Managed Care – PPO | Admitting: Internal Medicine

## 2019-07-07 ENCOUNTER — Encounter: Payer: Self-pay | Admitting: Internal Medicine

## 2019-07-07 ENCOUNTER — Other Ambulatory Visit: Payer: Self-pay

## 2019-07-07 VITALS — BP 119/84 | HR 90 | Temp 98.5°F | Ht 60.0 in | Wt 185.6 lb

## 2019-07-07 DIAGNOSIS — N809 Endometriosis, unspecified: Secondary | ICD-10-CM | POA: Diagnosis not present

## 2019-07-07 DIAGNOSIS — R102 Pelvic and perineal pain: Secondary | ICD-10-CM | POA: Diagnosis not present

## 2019-07-07 DIAGNOSIS — K921 Melena: Secondary | ICD-10-CM

## 2019-07-07 MED ORDER — TRAMADOL HCL 50 MG PO TABS: ORAL_TABLET | ORAL | 0 refills | Status: DC

## 2019-07-07 NOTE — Progress Notes (Signed)
Internal Medicine Clinic Attending  Case discussed with Dr. Helberg at the time of the visit.  We reviewed the resident's history and exam and pertinent patient test results.  I agree with the assessment, diagnosis, and plan of care documented in the resident's note.    

## 2019-07-07 NOTE — Patient Instructions (Signed)
Thank you for allowing [place] to provide your care. Today we are checking some basic lab work and getting you urgently referred to gastroenterology. Her bloodied diarrhea is likely the result of either an infection or inflammatory bowel disease. If you begin to have bloodied diarrhea again please seek medical care immediately at the emergency department.

## 2019-07-07 NOTE — Assessment & Plan Note (Addendum)
Patient presents with 1 week of abdominal pain and diarrhea. She states that she had a similar episode to this approximately one month ago however did not have diarrhea. She describes bloating and diffuse abdominal cramping worse with sitting down and eating/drinking. She is not noticed anything that makes it better. Approximately two days after the abdominal pain started she developed profuse diarrhea that became bloody. Persisted until that one day ago when she finally had a normal stool. She endorses subjective fevers and nausea/vomiting. She denies any new rashes, weight loss, arthralgias, conjunctivitis, visual changes, oral ulcers. She does use NSAIDs and a PPI. Her last colonoscopy was in 2013. Biopsies at that time were benign without any evidence of inflammatory bowel disease.  A/P: - Infectious vs IBD  - Unlikely to be ischemic - Lab (CMP and CBC) on 10/16 within normal limits. - Refill short course of Tramadol for abdominal pain - Urgent referral to [facility] for consideration of a colonoscopy.

## 2019-07-07 NOTE — Progress Notes (Signed)
   CC: Abdominal pain  HPI:  [name] is a 48 y.o. female with PMHx listed below presenting for abdominal pain. Please see the A&P for the status of the patient's chronic medical problems.  Past Medical History:  Diagnosis Date  . Anxiety   . Endometriosis    s/p TAH, LSO  . Family history of malignant neoplasm of gastrointestinal tract   . GERD (gastroesophageal reflux disease)   . Hearing difficulty    b/l, 2/2 congenital rubella s/p stapedectomy. Using hearing aid  . Hepatic cyst    6 mm on CT done done in 05/2005  . Hypothyroidism   . IBS (irritable bowel syndrome)   . Migraines    "frequency depends on my stress level" (07/23/2017)  . PONV (postoperative nausea and vomiting)    "sometimes; depends on how long I'm under"  . Shingles 03/2013   Review of Systems:  Performed and all others negative.  Physical Exam: Vitals:   07/07/19 0857  BP: 119/84  Pulse: 90  Temp: 98.5 F (36.9 C)  TempSrc: Oral  SpO2: 99%  Weight: 185 lb 9.6 oz (84.2 kg)  Height: 5' (1.524 m)   General: Obese female in no acute distress HENT: No oral ulcer  Pulm: Good air movement with no wheezing or crackles  CV: RRR, no murmurs, no rubs  Abdomen: Active bowel sounds, soft, non-distended, no fluid wave, no rebound, diffuse tenderness to palpation  Skin: No rashes  Assessment & Plan:   See Encounters Tab for problem based charting.  Patient discussed with Dr. Lynnae January

## 2019-07-11 ENCOUNTER — Other Ambulatory Visit: Payer: Self-pay | Admitting: Internal Medicine

## 2019-07-11 ENCOUNTER — Encounter: Payer: Self-pay | Admitting: Internal Medicine

## 2019-07-11 DIAGNOSIS — E039 Hypothyroidism, unspecified: Secondary | ICD-10-CM

## 2019-07-15 ENCOUNTER — Other Ambulatory Visit: Payer: Medicare Other

## 2019-07-15 ENCOUNTER — Encounter: Payer: Self-pay | Admitting: Nurse Practitioner

## 2019-07-15 ENCOUNTER — Ambulatory Visit (INDEPENDENT_AMBULATORY_CARE_PROVIDER_SITE_OTHER): Payer: BC Managed Care – PPO | Admitting: Nurse Practitioner

## 2019-07-15 ENCOUNTER — Other Ambulatory Visit: Payer: Self-pay

## 2019-07-15 VITALS — BP 130/70 | HR 76 | Temp 98.4°F | Ht 60.0 in | Wt 184.0 lb

## 2019-07-15 DIAGNOSIS — K921 Melena: Secondary | ICD-10-CM | POA: Diagnosis not present

## 2019-07-15 DIAGNOSIS — R109 Unspecified abdominal pain: Secondary | ICD-10-CM

## 2019-07-15 DIAGNOSIS — R140 Abdominal distension (gaseous): Secondary | ICD-10-CM

## 2019-07-15 DIAGNOSIS — R197 Diarrhea, unspecified: Secondary | ICD-10-CM

## 2019-07-15 NOTE — Progress Notes (Signed)
ASSESSMENT / PLAN:   1. 48 yo hearing impaired female with several week history of severe generalized abdominal pain, nausea / vomiting and diarrhea. Labs last month were unremarkable.  CTAP with contrast last month was unremarkable except for an enlarged lymph node in the RLQ.  More recently, she has had blood mixed in with the diarrhea (now resolved).   Sounds infectious, doubt ischemic or IBD.  -Bowel pattern is now mainly constipation and intermittent diarrhea but she has ongoing nausea, bloating and generalized abdominal pain.   -Even though she is not having diarrhea all the time now I would like to check stool for C. Difficile.  She had antibiotics for dental purposes but cannot remember if it was before or after onset of GI symptoms. I realize that  C-difficile infection would not explain the hematochezia.  -If C. difficile is negative and symptoms persist then we will need to discuss endoscopic evaluation.  -Samples of Align given , # 28  2. Longstanding history of IBS, mainly constipation over last several years.   HPI:    Referring Provider:   Larey Dresser, MD    Reason for referral:   Abdominal pain and diarrhea.   Chief Complaint:   Abdominal pain and diarrhea with blood  Patient is a 48 year old female hearing impaired and here with sign language interpreter. She was previously followed by Dr. Sharlett Davis for history of IBS/diarrhea.  Colonoscopy with random biopsies in 2014 was unremarkable.   Donna Davis gives a history of generally having more problems with constipation than diarrhea she manages her bowels with stool softeners and MiraLAX as needed.  Over the last several weeks she has been experiencing severe generalized abdominal pain and diarrhea. CT scan. CMET, lipase and CBC all normal.  CTAP with contrast unremarkable unremarkable except for an enlarged lymph node in the RLQ. Taking  tramadol for the pain.  Several days ago she began having bloody  diarrhea which lasted ~ 3 days. Currently alternating between constipation and diarrhea but hasn't had any further bleeding. Still having nausea, poor appetite and some residual generalized abdominal pain. Donna Davis hasn't changed her diet except omission of junk food. No new medications. She does recall taking antibiotics for dental purposes but can't be sure if it was before of after diarrhea started.    Data Reviewed:  07/03/19 CMET normal.  CBC normal  05/20/2019 CTAP with contrast for evaluation of generalized abdominal pain- No acute abnormalities.  Asymmetric enlarged, less than 1 cm in short axis mesenteric lymph node in the RLQ of the abdomen.    Past Medical History:  Diagnosis Date  . Anxiety   . Deaf    uses sign language  . Endometriosis    s/p TAH, LSO  . Family history of malignant neoplasm of gastrointestinal tract   . Gallstones   . GERD (gastroesophageal reflux disease)   . Hearing difficulty    b/l, 2/2 congenital rubella s/p stapedectomy. Using hearing aid  . Hepatic cyst    6 mm on CT done done in 05/2005  . Hypothyroidism   . IBS (irritable bowel syndrome)   . Migraines    "frequency depends on my stress level" (07/23/2017)  . Obesity   . PONV (postoperative nausea and vomiting)    "sometimes; depends on how long I'm under"  . Shingles 03/2013     Past Surgical History:  Procedure Laterality Date  . ABDOMINAL  ADHESION SURGERY     Enterolysis  . CESAREAN SECTION  1994  . CHOLECYSTECTOMY N/A 06/26/2013   Procedure: LAPAROSCOPIC CHOLECYSTECTOMY WITH ATTEMPTED INTRAOPERATIVE CHOLANGIOGRAM;  Surgeon: Harl Bowie, MD;  Location: [facility];  Service: General;  Laterality: N/A;  . COCHLEAR IMPLANT  03/30/2016  . COCHLEAR IMPLANT Right 03/2015  . COLONOSCOPY WITH PROPOFOL  04/23/2012  . DIAGNOSTIC LAPAROSCOPY  01/14/2006   "I've had several" (07/23/2017)  . ESOPHAGOGASTRODUODENOSCOPY (EGD) WITH PROPOFOL  04/23/2012  . JOINT REPLACEMENT    . KNEE SURGERY  Right 2014  . LAPAROSCOPIC OVARIAN CYSTECTOMY Right 04/16/2000  . SALPINGOOPHORECTOMY Left   . STAPEDECTOMY Left 05/14/2006  . Thyroid Nodule removed  1997  . TONSILLECTOMY    . TOTAL ABDOMINAL HYSTERECTOMY  01/13/2004   Endometriosis with residual right ovary. Multiple GYN surgeries for chronic pelvic pain; had LSO in past  . TUBAL LIGATION    . WOUND EXPLORATION Right 10/16/2016   Procedure: RIGHT EXPLORATION OF LACERATION OF INDEX FINGER;  Surgeon: Leanora Cover, MD;  Location: [facility];  Service: Orthopedics;  Laterality: Right;   Family History  Problem Relation Age of Onset  . Breast cancer Mother   . Ovarian cancer Mother   . Diabetes Mother   . Pancreatic cancer Father   . Prostate cancer Father   . Colon cancer Father   . Diabetes Father   . Heart disease Father   . Irritable bowel syndrome Father   . Kidney disease Father   . Stomach cancer Maternal Grandmother   . Colon cancer Maternal Grandmother   . Diabetes Maternal Grandmother   . Heart disease Other        both grandmothers  . Bipolar disorder Daughter    Social History   Tobacco Use  . Smoking status: Former Smoker    Packs/day: 1.00    Years: 26.00    Pack years: 26.00    Types: Cigarettes    Quit date: 02/18/2008    Years since quitting: 11.4  . Smokeless tobacco: Never Used  Substance Use Topics  . Alcohol use: Yes    Comment: Occasionally.  . Drug use: No   Current Outpatient Medications  Medication Sig Dispense Refill  . ALPRAZolam (XANAX) 0.25 MG tablet Take 1 tablet (0.25 mg total) by mouth at bedtime as needed for anxiety. 30 tablet 2  . Ascorbic Acid (VITAMIN C PO) Take 1 tablet by mouth daily.    . betamethasone valerate (VALISONE) 0.1 % cream APPLY 1 APPLICATION ON THE SKIN TWICE DAILY AS NEEDED  2  . busPIRone (BUSPAR) 15 MG tablet Takes 1 tab in am, 1.5 tabs at lunch and 1 tab at bedtime 120 tablet 5  . Calcium Carbonate (CALCIUM 600 PO) Take 1 tablet by mouth daily.    Marland Kitchen  estradiol (ESTRACE) 1 MG tablet Take 1 tablet (1 mg total) by mouth daily. 30 tablet 11  . Krill Oil (OMEGA-3) 500 MG CAPS Take 1 capsule by mouth daily.    Marland Kitchen omeprazole (PRILOSEC) 20 MG capsule TAKE 1 CAPSULE BY MOUTH TWICE DAILY BEFORE A MEAL OR  FOOD 60 capsule 3  . promethazine (PHENERGAN) 12.5 MG tablet Take 1 tablet (12.5 mg total) by mouth every 6 (six) hours as needed for nausea or vomiting. 30 tablet 0  . traMADol (ULTRAM) 50 MG tablet TAKE 1 TABLET BY MOUTH EVERY 6 HOURS AS NEEDED FOR  MODERATE TO  SEVERE  PAIN 30 tablet 0  . VITAMIN D PO One daily    .  EUTHYROX 25 MCG tablet Take 1 tablet by mouth once daily (Patient not taking: Reported on 07/15/2019) 90 tablet 0  . ibuprofen (ADVIL,MOTRIN) 800 MG tablet Take 1 tablet (800 mg total) by mouth every 8 (eight) hours as needed. (Patient not taking: Reported on 07/15/2019) 30 tablet 0   No current facility-administered medications for this visit.    Allergies  Allergen Reactions  . Onion Anaphylaxis  . Other Other (See Comments)    Ossmo Prep - drop in BP Steroids Went crazy  . Shellfish Allergy Anaphylaxis and Swelling  . Amitriptyline Anxiety, Palpitations and Other (See Comments)    hallucinations  . Amoxicillin-Pot Clavulanate Nausea And Vomiting    With high dose, low dose ok  . Amoxicillin-Pot Clavulanate Nausea And Vomiting    With high dose, low dose ok  . Cefuroxime Nausea And Vomiting  . Cephalexin Other (See Comments)     gi upset  gi upset  . Ciprofloxacin Other (See Comments)     Fevers  . Clarithromycin Nausea And Vomiting and Other (See Comments)    Pt is OK with azithromycin  gi upset  . Clarithromycin Nausea And Vomiting    Pt is OK with azithromycin  . Doxycycline Other (See Comments) and Hives     gi upset  gi upset  . Propranolol Other (See Comments)    Fatigue and makes her "feel crazy", refuses to take.  . Sulfonamide Derivatives Other (See Comments)    vaginal blisters  . Sulfa Antibiotics  Other (See Comments)    Rash, vaginal blisters  . Sulfasalazine Itching    Rash, vaginal blisters Rash, vaginal blisters   . Benadryl [Diphenhydramine Hcl (Sleep)] Anxiety    Extreme agitation  . Shrimp Flavor Other (See Comments)    unknown     Review of Systems: Positive for back pain, fatigue, fever, headaches, itching, skin rash. All other systems reviewed and negative except where noted in HPI.   Serum creatinine: 0.68 mg/dL 07/03/19 1032 Estimated creatinine clearance: 80.6 mL/min   Physical Exam:    Wt Readings from Last 3 Encounters:  07/15/19 184 lb (83.5 kg)  07/07/19 185 lb 9.6 oz (84.2 kg)  05/20/19 183 lb 14.4 oz (83.4 kg)    BP 130/70   Pulse 76   Temp 98.4 F (36.9 C)   Ht 5' (1.524 m)   Wt 184 lb (83.5 kg)   LMP 12/26/2012   BMI 35.94 kg/m  Constitutional:  Pleasant female in no acute distress. Psychiatric: Normal mood and affect. Behavior is normal. EENT: Pupils normal.  Impaired hearing. Conjunctivae are normal. No scleral icterus. Neck supple.  Cardiovascular: Normal rate, regular rhythm. No edema Pulmonary/chest: Effort normal and breath sounds normal. No wheezing, rales or rhonchi. Abdominal: Soft, nondistended, nontender. Bowel sounds active throughout. There are no masses palpable. No hepatomegaly. Neurological: Alert and oriented to person place and time. Skin: Skin is warm and dry. No rashes noted.  Donna Savoy, Donna Davis  07/15/2019, 3:43 PM  Cc: Bartholomew Crews, MD

## 2019-07-15 NOTE — Patient Instructions (Signed)
If you are age 50 or older, your body mass index should be between 23-30. Your Body mass index is 35.94 kg/m. If this is out of the aforementioned range listed, please consider follow up with your Primary Care Provider.  If you are age 83 or younger, your body mass index should be between 19-25. Your Body mass index is 35.94 kg/m. If this is out of the aformentioned range listed, please consider follow up with your Primary Care Provider.   Your provider has requested that you go to the basement level for lab work before leaving today. Press "B" on the elevator. The lab is located at the first door on the left as you exit the elevator.  You have been given samples of Align - take for 28 days.  Call in two weeks with an update.  Thank you for choosing me and [facility].   Tye Savoy, NP

## 2019-07-16 ENCOUNTER — Encounter: Payer: Self-pay | Admitting: Nurse Practitioner

## 2019-07-16 ENCOUNTER — Other Ambulatory Visit: Payer: Medicare Other

## 2019-07-16 ENCOUNTER — Other Ambulatory Visit: Payer: Self-pay

## 2019-07-16 DIAGNOSIS — R109 Unspecified abdominal pain: Secondary | ICD-10-CM

## 2019-07-16 DIAGNOSIS — K921 Melena: Secondary | ICD-10-CM

## 2019-07-16 DIAGNOSIS — R140 Abdominal distension (gaseous): Secondary | ICD-10-CM

## 2019-07-16 DIAGNOSIS — R197 Diarrhea, unspecified: Secondary | ICD-10-CM

## 2019-07-16 LAB — C.DIFFICILE TOXIN: C. Difficile Toxin A: DETECTED — AB

## 2019-07-16 MED ORDER — VANCOMYCIN HCL 125 MG PO CAPS: 125.0000 mg | ORAL_CAPSULE | Freq: Four times a day (QID) | ORAL | 0 refills | Status: DC

## 2019-07-16 NOTE — Progress Notes (Signed)
Addendum: Reviewed and agree with assessment and management plan. Kimyah Frein M, MD  

## 2019-07-17 LAB — FECAL LACTOFERRIN, QUANT
Fecal Lactoferrin: NEGATIVE
MICRO NUMBER:: 1045060
SPECIMEN QUALITY:: ADEQUATE

## 2019-07-20 ENCOUNTER — Other Ambulatory Visit: Payer: Self-pay

## 2019-07-20 MED ORDER — VANCOMYCIN HCL 125 MG PO CAPS: 125.0000 mg | ORAL_CAPSULE | Freq: Four times a day (QID) | ORAL | 0 refills | Status: AC

## 2019-07-22 ENCOUNTER — Telehealth: Payer: Self-pay

## 2019-07-22 NOTE — Telephone Encounter (Signed)
Incoming fax from [facility]. Vancomycin is not covered. Please advise.

## 2019-07-22 NOTE — Telephone Encounter (Signed)
Prior authorization approval by insurance for vancomycin to treat C Diff. [facility] pharmacy notified. Patient notified.

## 2019-07-23 DIAGNOSIS — Z20828 Contact with and (suspected) exposure to other viral communicable diseases: Secondary | ICD-10-CM | POA: Diagnosis not present

## 2019-07-23 DIAGNOSIS — J069 Acute upper respiratory infection, unspecified: Secondary | ICD-10-CM | POA: Diagnosis not present

## 2019-07-23 NOTE — Telephone Encounter (Signed)
Great, not sure why they would not have covered it. Thanks

## 2019-08-18 ENCOUNTER — Encounter: Payer: Self-pay | Admitting: Internal Medicine

## 2019-08-18 DIAGNOSIS — K921 Melena: Secondary | ICD-10-CM

## 2019-08-20 MED ORDER — TRAMADOL HCL 50 MG PO TABS: ORAL_TABLET | ORAL | 0 refills | Status: DC

## 2019-08-20 MED ORDER — ALPRAZOLAM 0.25 MG PO TABS: 0.2500 mg | ORAL_TABLET | Freq: Every evening | ORAL | 2 refills | Status: DC | PRN

## 2019-09-02 DIAGNOSIS — M9903 Segmental and somatic dysfunction of lumbar region: Secondary | ICD-10-CM | POA: Diagnosis not present

## 2019-09-02 DIAGNOSIS — M9905 Segmental and somatic dysfunction of pelvic region: Secondary | ICD-10-CM | POA: Diagnosis not present

## 2019-09-02 DIAGNOSIS — M9904 Segmental and somatic dysfunction of sacral region: Secondary | ICD-10-CM | POA: Diagnosis not present

## 2019-09-02 DIAGNOSIS — M5136 Other intervertebral disc degeneration, lumbar region: Secondary | ICD-10-CM | POA: Diagnosis not present

## 2019-09-03 DIAGNOSIS — M9905 Segmental and somatic dysfunction of pelvic region: Secondary | ICD-10-CM | POA: Diagnosis not present

## 2019-09-03 DIAGNOSIS — M9903 Segmental and somatic dysfunction of lumbar region: Secondary | ICD-10-CM | POA: Diagnosis not present

## 2019-09-03 DIAGNOSIS — M5136 Other intervertebral disc degeneration, lumbar region: Secondary | ICD-10-CM | POA: Diagnosis not present

## 2019-09-03 DIAGNOSIS — M9904 Segmental and somatic dysfunction of sacral region: Secondary | ICD-10-CM | POA: Diagnosis not present

## 2019-09-07 DIAGNOSIS — M9904 Segmental and somatic dysfunction of sacral region: Secondary | ICD-10-CM | POA: Diagnosis not present

## 2019-09-07 DIAGNOSIS — M9903 Segmental and somatic dysfunction of lumbar region: Secondary | ICD-10-CM | POA: Diagnosis not present

## 2019-09-07 DIAGNOSIS — M5136 Other intervertebral disc degeneration, lumbar region: Secondary | ICD-10-CM | POA: Diagnosis not present

## 2019-09-07 DIAGNOSIS — M9905 Segmental and somatic dysfunction of pelvic region: Secondary | ICD-10-CM | POA: Diagnosis not present

## 2019-09-15 ENCOUNTER — Emergency Department (HOSPITAL_COMMUNITY): Payer: BC Managed Care – PPO

## 2019-09-15 ENCOUNTER — Emergency Department (HOSPITAL_COMMUNITY)
Admission: EM | Admit: 2019-09-15 | Discharge: 2019-09-16 | Discharge status: Against Medical Advice | Payer: BC Managed Care – PPO | Attending: Emergency Medicine | Admitting: Emergency Medicine

## 2019-09-15 ENCOUNTER — Emergency Department (HOSPITAL_BASED_OUTPATIENT_CLINIC_OR_DEPARTMENT_OTHER)
Admit: 2019-09-15 | Discharge: 2019-09-15 | Disposition: A | Discharge status: Home / Self Care | Payer: BC Managed Care – PPO | Attending: Emergency Medicine | Admitting: Emergency Medicine

## 2019-09-15 ENCOUNTER — Other Ambulatory Visit: Payer: Self-pay

## 2019-09-15 ENCOUNTER — Ambulatory Visit (INDEPENDENT_AMBULATORY_CARE_PROVIDER_SITE_OTHER): Payer: BC Managed Care – PPO | Admitting: Internal Medicine

## 2019-09-15 ENCOUNTER — Encounter (HOSPITAL_COMMUNITY): Payer: Self-pay | Admitting: Emergency Medicine

## 2019-09-15 VITALS — BP 128/80 | HR 81 | Wt 191.4 lb

## 2019-09-15 DIAGNOSIS — R0602 Shortness of breath: Secondary | ICD-10-CM | POA: Diagnosis not present

## 2019-09-15 DIAGNOSIS — M9905 Segmental and somatic dysfunction of pelvic region: Secondary | ICD-10-CM | POA: Diagnosis not present

## 2019-09-15 DIAGNOSIS — R52 Pain, unspecified: Secondary | ICD-10-CM

## 2019-09-15 DIAGNOSIS — Z87891 Personal history of nicotine dependence: Secondary | ICD-10-CM

## 2019-09-15 DIAGNOSIS — Z5321 Procedure and treatment not carried out due to patient leaving prior to being seen by health care provider: Secondary | ICD-10-CM | POA: Diagnosis not present

## 2019-09-15 DIAGNOSIS — M9903 Segmental and somatic dysfunction of lumbar region: Secondary | ICD-10-CM | POA: Diagnosis not present

## 2019-09-15 DIAGNOSIS — M79604 Pain in right leg: Secondary | ICD-10-CM | POA: Diagnosis not present

## 2019-09-15 DIAGNOSIS — M5136 Other intervertebral disc degeneration, lumbar region: Secondary | ICD-10-CM | POA: Diagnosis not present

## 2019-09-15 DIAGNOSIS — R0989 Other specified symptoms and signs involving the circulatory and respiratory systems: Secondary | ICD-10-CM | POA: Insufficient documentation

## 2019-09-15 DIAGNOSIS — M9904 Segmental and somatic dysfunction of sacral region: Secondary | ICD-10-CM | POA: Diagnosis not present

## 2019-09-15 DIAGNOSIS — M7989 Other specified soft tissue disorders: Secondary | ICD-10-CM | POA: Diagnosis not present

## 2019-09-15 LAB — CBC
HCT: 40.2 % (ref 36.0–46.0)
Hemoglobin: 13.2 g/dL (ref 12.0–15.0)
MCH: 31.4 pg (ref 26.0–34.0)
MCHC: 32.8 g/dL (ref 30.0–36.0)
MCV: 95.7 fL (ref 80.0–100.0)
Platelets: 253 10*3/uL (ref 150–400)
RBC: 4.2 MIL/uL (ref 3.87–5.11)
RDW: 11.5 % (ref 11.5–15.5)
WBC: 6.9 10*3/uL (ref 4.0–10.5)
nRBC: 0 % (ref 0.0–0.2)

## 2019-09-15 LAB — TROPONIN I (HIGH SENSITIVITY)
Troponin I (High Sensitivity): <2 ng/L (ref <18)
Troponin I (High Sensitivity): <2 ng/L (ref <18)

## 2019-09-15 LAB — COMPREHENSIVE METABOLIC PANEL
ALT: 14 U/L (ref 0–44)
AST: 17 U/L (ref 15–41)
Albumin: 3.7 g/dL (ref 3.5–5.0)
Alkaline Phosphatase: 51 U/L (ref 38–126)
Anion gap: 12 (ref 5–15)
BUN: 5 mg/dL — ABNORMAL LOW (ref 6–20)
CO2: 23 mmol/L (ref 22–32)
Calcium: 9.5 mg/dL (ref 8.9–10.3)
Chloride: 107 mmol/L (ref 98–111)
Creatinine, Ser: 0.65 mg/dL (ref 0.44–1.00)
GFR calc Af Amer: >60 mL/min (ref >60)
GFR calc non Af Amer: >60 mL/min (ref >60)
Glucose, Bld: 87 mg/dL (ref 70–99)
Potassium: 3.9 mmol/L (ref 3.5–5.1)
Sodium: 142 mmol/L (ref 135–145)
Total Bilirubin: 0.6 mg/dL (ref 0.3–1.2)
Total Protein: 6.6 g/dL (ref 6.5–8.1)

## 2019-09-15 NOTE — Progress Notes (Signed)
Right lower extremity venous duplex has been completed. Preliminary results can be found in [facility] through chart review.  Results were given to the patient's nurse, [place].  09/15/19 5:26 PM Carlos Levering RVT

## 2019-09-15 NOTE — ED Notes (Signed)
[phone] Donna Davis [facility] Interpreter) call if needed for anything &/or when pt is placed in a room.

## 2019-09-15 NOTE — Progress Notes (Signed)
CC: leg swelling, shortness of breath suspected pulmonary embolism   HPI:  [name] is a 48 y.o. female with PMH below.  Today we will address leg swelling and shortness of breath with concern for pulmonary embolism.    Please see A&P for status of the patient's chronic medical conditions  Past Medical History:  Diagnosis Date  . Anxiety   . Clostridium difficile infection    x several times  . Deaf    uses sign language  . Endometriosis    s/p TAH, LSO  . Family history of malignant neoplasm of gastrointestinal tract   . Gallstones   . GERD (gastroesophageal reflux disease)   . Hearing difficulty    b/l, 2/2 congenital rubella s/p stapedectomy. Using hearing aid  . Hepatic cyst    6 mm on CT done done in 05/2005  . Hypothyroidism   . IBS (irritable bowel syndrome)   . Migraines    "frequency depends on my stress level" (07/23/2017)  . Obesity   . PONV (postoperative nausea and vomiting)    "sometimes; depends on how long I'm under"  . Shingles 03/2013   Review of Systems:  ROS: Pulmonary: pt endorses increased work of breathing, shortness of breath,  Cardiac: pt denies palpitations, chest pain,  Abdominal: pt denies abdominal pain, nausea, vomiting, or diarrhea   Physical Exam:  Vitals:   09/15/19 1446  BP: 128/80  Pulse: 81  SpO2: 100%  Weight: 191 lb 6.4 oz (86.8 kg)   Cardiac: JVD flat, normal rate and rhythm, clear s1 and s2, no murmurs, rubs or gallops, no pitting edema observed bilaterally Pulmonary: CTAB, not in distress, some mild increased work of breathing Right Leg: tenderness to palpation on calf, overall none to possibly minimal enlargement compared with the left leg.  No pitting edema.   Psych: Alert, conversant, in good spirits   Social History   Socioeconomic History  . Marital status: Married    Spouse name: Not on file  . Number of children: 2  . Years of education: Not on file  . Highest education level: Not on file    Occupational History  . Occupation: Art therapist    Comment: works in [place] Use  . Smoking status: Former Smoker    Packs/day: 1.00    Years: 26.00    Pack years: 26.00    Types: Cigarettes    Quit date: 02/18/2008    Years since quitting: 11.5  . Smokeless tobacco: Never Used  . Tobacco comment: quit 2008  Substance and Sexual Activity  . Alcohol use: Yes    Comment: Occasionally.  . Drug use: No  . Sexual activity: Not Currently    Partners: Male    Birth control/protection: Surgical  Other Topics Concern  . Not on file  Social History Narrative   Current smoker, not using alcohol or drugs at this time   Lives with husband and 2 children   Social Determinants of Health   Financial Resource Strain:   . Difficulty of Paying Living Expenses: Not on file  Food Insecurity:   . Worried About Charity fundraiser in the Last Year: Not on file  . Ran Out of Food in the Last Year: Not on file  Transportation Needs:   . Lack of Transportation (Medical): Not on file  . Lack of Transportation (Non-Medical): Not on file  Physical Activity:   . Days of Exercise per Week: Not on file  . Minutes  of Exercise per Session: Not on file  Stress:   . Feeling of Stress : Not on file  Social Connections:   . Frequency of Communication with Friends and Family: Not on file  . Frequency of Social Gatherings with Friends and Family: Not on file  . Attends Religious Services: Not on file  . Active Member of Clubs or Organizations: Not on file  . Attends Archivist Meetings: Not on file  . Marital Status: Not on file  Intimate Partner Violence:   . Fear of Current or Ex-Partner: Not on file  . Emotionally Abused: Not on file  . Physically Abused: Not on file  . Sexually Abused: Not on file    Family History  Problem Relation Age of Onset  . Breast cancer Mother   . Ovarian cancer Mother   . Prostate cancer Father   . Diabetes Father   . Heart disease  Father        heart attack  . Irritable bowel syndrome Father   . Kidney disease Father   . Colon polyps Father   . Stomach cancer Maternal Grandmother   . Colon cancer Maternal Grandmother   . Diabetes Maternal Grandmother   . Heart disease Other        both grandmothers  . Bipolar disorder Daughter     Assessment & Plan:   See Encounters Tab for problem based charting.  Patient discussed with Dr. Evette Doffing

## 2019-09-15 NOTE — ED Triage Notes (Signed)
Pt here from clinic with right leg pain and sob , no chest pain , pt does have a bruise behind her right knee

## 2019-09-15 NOTE — Assessment & Plan Note (Addendum)
2 weeks of right sided leg swelling and pain.  She reports the leg swelling was quite extreme and it has improved substantially over the last 1-2 days.  She also noted bruising on the upper right lateral calf which she says has improved somewhat.  She denies any trauma to the area.  She additionally reports getting extremely short of breath beginning at 3am.  She denies any chest pain, just feels like she can't catch her breath.  No fevers or chills, she does have a cough that began this morning along with the shortness of breath but she denies hemoptysis.  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  For the most part she's isolated in a camper.  She recently moved back to [place].  No changes to sense of smell or taste. She has no personal or family history of blood clots.  She has had no long periods of immobility and no recent surgeries.  She has taken estradiol for over a decade after her hysterectomy.  Her vitals and oxygen saturation are normal here during check in.    -given suspicion for PE we will transfer care to the Emergency Department for expedited workup

## 2019-09-15 NOTE — Progress Notes (Signed)
Patient was transported via wheelchair to ED for possible PE.  Interpreter in attendance with patient.  Sander Nephew, RN 09/15/2019 4:00 PM.

## 2019-09-16 ENCOUNTER — Encounter: Payer: Self-pay | Admitting: Internal Medicine

## 2019-09-16 NOTE — ED Notes (Signed)
Pt states that she does not want to stay here until in the morning. States that she has been checking MyChart to see her results, this NT explained that I am not able to give her any results from her tests. Pt stated that she is leaving.

## 2019-09-16 NOTE — Progress Notes (Signed)
Internal Medicine Clinic Attending  Case discussed with Dr. Winfrey  at the time of the visit.  We reviewed the resident's history and exam and pertinent patient test results.  I agree with the assessment, diagnosis, and plan of care documented in the resident's note.  

## 2019-09-20 ENCOUNTER — Encounter: Payer: Self-pay | Admitting: Internal Medicine

## 2019-09-20 DIAGNOSIS — Z20828 Contact with and (suspected) exposure to other viral communicable diseases: Secondary | ICD-10-CM | POA: Diagnosis not present

## 2019-09-29 ENCOUNTER — Other Ambulatory Visit: Payer: Self-pay | Admitting: *Deleted

## 2019-09-29 ENCOUNTER — Encounter: Payer: Self-pay | Admitting: Internal Medicine

## 2019-09-29 DIAGNOSIS — K921 Melena: Secondary | ICD-10-CM

## 2019-09-29 DIAGNOSIS — S93502A Unspecified sprain of left great toe, initial encounter: Secondary | ICD-10-CM | POA: Diagnosis not present

## 2019-09-30 MED ORDER — TRAMADOL HCL 50 MG PO TABS: ORAL_TABLET | ORAL | 0 refills | Status: DC

## 2019-10-05 ENCOUNTER — Encounter: Payer: Self-pay | Admitting: Internal Medicine

## 2019-10-05 DIAGNOSIS — E039 Hypothyroidism, unspecified: Secondary | ICD-10-CM

## 2019-10-06 ENCOUNTER — Other Ambulatory Visit: Payer: Self-pay | Admitting: *Deleted

## 2019-10-06 DIAGNOSIS — E039 Hypothyroidism, unspecified: Secondary | ICD-10-CM

## 2019-10-06 MED ORDER — LEVOTHYROXINE SODIUM 25 MCG PO TABS: 25.0000 ug | ORAL_TABLET | Freq: Every day | ORAL | 3 refills | Status: DC

## 2019-10-12 ENCOUNTER — Other Ambulatory Visit: Payer: Self-pay | Admitting: *Deleted

## 2019-10-12 ENCOUNTER — Encounter: Payer: Self-pay | Admitting: Internal Medicine

## 2019-10-13 MED ORDER — BUSPIRONE HCL 15 MG PO TABS: ORAL_TABLET | ORAL | 5 refills | Status: DC

## 2019-10-29 ENCOUNTER — Encounter: Payer: Self-pay | Admitting: Internal Medicine

## 2019-10-29 ENCOUNTER — Other Ambulatory Visit: Payer: Self-pay | Admitting: *Deleted

## 2019-10-29 DIAGNOSIS — K921 Melena: Secondary | ICD-10-CM

## 2019-10-29 MED ORDER — TRAMADOL HCL 50 MG PO TABS: ORAL_TABLET | ORAL | 0 refills | Status: DC

## 2019-10-31 DIAGNOSIS — Z20828 Contact with and (suspected) exposure to other viral communicable diseases: Secondary | ICD-10-CM | POA: Diagnosis not present

## 2019-11-03 ENCOUNTER — Encounter: Payer: Self-pay | Admitting: Internal Medicine

## 2019-11-10 ENCOUNTER — Other Ambulatory Visit: Payer: Self-pay | Admitting: Internal Medicine

## 2019-11-10 ENCOUNTER — Encounter: Payer: Self-pay | Admitting: Internal Medicine

## 2019-11-10 DIAGNOSIS — K219 Gastro-esophageal reflux disease without esophagitis: Secondary | ICD-10-CM

## 2019-11-17 ENCOUNTER — Encounter: Payer: Self-pay | Admitting: Internal Medicine

## 2019-11-17 MED ORDER — ALPRAZOLAM 0.25 MG PO TABS: 0.2500 mg | ORAL_TABLET | Freq: Every evening | ORAL | 2 refills | Status: DC | PRN

## 2019-11-26 ENCOUNTER — Other Ambulatory Visit: Payer: Self-pay | Admitting: Internal Medicine

## 2019-11-26 ENCOUNTER — Other Ambulatory Visit: Payer: Self-pay | Admitting: *Deleted

## 2019-11-26 ENCOUNTER — Encounter: Payer: Self-pay | Admitting: Internal Medicine

## 2019-11-26 DIAGNOSIS — K921 Melena: Secondary | ICD-10-CM

## 2019-11-26 MED ORDER — TRAMADOL HCL 50 MG PO TABS: ORAL_TABLET | ORAL | 0 refills | Status: DC

## 2019-12-02 DIAGNOSIS — M9901 Segmental and somatic dysfunction of cervical region: Secondary | ICD-10-CM | POA: Diagnosis not present

## 2019-12-02 DIAGNOSIS — M5031 Other cervical disc degeneration,  high cervical region: Secondary | ICD-10-CM | POA: Diagnosis not present

## 2019-12-03 DIAGNOSIS — M5031 Other cervical disc degeneration,  high cervical region: Secondary | ICD-10-CM | POA: Diagnosis not present

## 2019-12-03 DIAGNOSIS — M9901 Segmental and somatic dysfunction of cervical region: Secondary | ICD-10-CM | POA: Diagnosis not present

## 2019-12-08 DIAGNOSIS — Z20828 Contact with and (suspected) exposure to other viral communicable diseases: Secondary | ICD-10-CM | POA: Diagnosis not present

## 2019-12-14 ENCOUNTER — Other Ambulatory Visit: Payer: Self-pay | Admitting: Internal Medicine

## 2019-12-14 ENCOUNTER — Encounter: Payer: Self-pay | Admitting: Internal Medicine

## 2019-12-14 DIAGNOSIS — K921 Melena: Secondary | ICD-10-CM

## 2019-12-14 MED ORDER — TRAMADOL HCL 50 MG PO TABS: ORAL_TABLET | ORAL | 0 refills | Status: DC

## 2019-12-14 NOTE — Progress Notes (Signed)
Reviewed my chart message for early tramadol refill.  Called [facility] - had no record of an antibiotic being prescribed on the 23rd.  [place] narcotic database which is without red flags.  Last contract was in 2013 for Xanax.  Last UDS was in 2008.  There seem to be no red flags or warnings in the chart, the hx is plausible, will refill early.

## 2020-01-05 ENCOUNTER — Encounter: Payer: Self-pay | Admitting: Internal Medicine

## 2020-01-05 DIAGNOSIS — M9901 Segmental and somatic dysfunction of cervical region: Secondary | ICD-10-CM | POA: Diagnosis not present

## 2020-01-05 DIAGNOSIS — M5031 Other cervical disc degeneration,  high cervical region: Secondary | ICD-10-CM | POA: Diagnosis not present

## 2020-01-06 MED ORDER — BUSPIRONE HCL 10 MG PO TABS: 20.0000 mg | ORAL_TABLET | Freq: Three times a day (TID) | ORAL | 5 refills | Status: DC

## 2020-01-10 ENCOUNTER — Other Ambulatory Visit: Payer: Self-pay | Admitting: Internal Medicine

## 2020-01-10 ENCOUNTER — Encounter: Payer: Self-pay | Admitting: Internal Medicine

## 2020-01-10 DIAGNOSIS — N951 Menopausal and female climacteric states: Secondary | ICD-10-CM

## 2020-01-11 MED ORDER — ESTRADIOL 1 MG PO TABS: 1.0000 mg | ORAL_TABLET | Freq: Every day | ORAL | 11 refills | Status: DC

## 2020-01-13 ENCOUNTER — Encounter: Payer: Self-pay | Admitting: Internal Medicine

## 2020-01-13 ENCOUNTER — Other Ambulatory Visit: Payer: Self-pay | Admitting: *Deleted

## 2020-01-13 ENCOUNTER — Telehealth: Payer: Self-pay | Admitting: *Deleted

## 2020-01-13 DIAGNOSIS — K921 Melena: Secondary | ICD-10-CM

## 2020-01-13 MED ORDER — TRAMADOL HCL 50 MG PO TABS: ORAL_TABLET | ORAL | 0 refills | Status: DC

## 2020-01-13 NOTE — Telephone Encounter (Signed)
Information was sent to CoverMyMeds for [place] for Tramadol.  Approved 01/13/2020 thru 07/11/2020 Sander Nephew, RN 01/13/2020 4:27 PM.

## 2020-01-22 ENCOUNTER — Ambulatory Visit: Payer: BC Managed Care – PPO | Attending: Internal Medicine

## 2020-01-22 DIAGNOSIS — Z23 Encounter for immunization: Secondary | ICD-10-CM

## 2020-01-22 NOTE — Progress Notes (Signed)
   Covid-19 Vaccination Clinic  Name:  Latonyia MAUDEEN KIMPEL    MRN: [id number] DOB: Oct 01, 1970  01/22/2020  Ms. Bumgarner was observed post Covid-19 immunization for 30 minutes based on pre-vaccination screening without incident. She was provided with Vaccine Information Sheet and instruction to access the V-Safe system.   Ms. Arnow was instructed to call 911 with any severe reactions post vaccine: Marland Kitchen Difficulty breathing  . Swelling of face and throat  . A fast heartbeat  . A bad rash all over body  . Dizziness and weakness   Immunizations Administered    Name Date Dose VIS Date Route   Moderna COVID-19 Vaccine 01/22/2020 12:02 PM 0.5 mL 08/2019 Intramuscular   Manufacturer: Moderna   Lot: [id number]   [id number]

## 2020-02-01 DIAGNOSIS — G44209 Tension-type headache, unspecified, not intractable: Secondary | ICD-10-CM | POA: Diagnosis not present

## 2020-02-01 DIAGNOSIS — M542 Cervicalgia: Secondary | ICD-10-CM | POA: Diagnosis not present

## 2020-02-01 DIAGNOSIS — M5441 Lumbago with sciatica, right side: Secondary | ICD-10-CM | POA: Diagnosis not present

## 2020-02-01 DIAGNOSIS — M9903 Segmental and somatic dysfunction of lumbar region: Secondary | ICD-10-CM | POA: Diagnosis not present

## 2020-02-04 DIAGNOSIS — M5441 Lumbago with sciatica, right side: Secondary | ICD-10-CM | POA: Diagnosis not present

## 2020-02-04 DIAGNOSIS — G44209 Tension-type headache, unspecified, not intractable: Secondary | ICD-10-CM | POA: Diagnosis not present

## 2020-02-04 DIAGNOSIS — M542 Cervicalgia: Secondary | ICD-10-CM | POA: Diagnosis not present

## 2020-02-04 DIAGNOSIS — M9903 Segmental and somatic dysfunction of lumbar region: Secondary | ICD-10-CM | POA: Diagnosis not present

## 2020-02-09 ENCOUNTER — Encounter: Payer: Self-pay | Admitting: Internal Medicine

## 2020-02-09 DIAGNOSIS — K921 Melena: Secondary | ICD-10-CM

## 2020-02-09 MED ORDER — TRAMADOL HCL 50 MG PO TABS: ORAL_TABLET | ORAL | 5 refills | Status: DC

## 2020-02-11 DIAGNOSIS — M542 Cervicalgia: Secondary | ICD-10-CM | POA: Diagnosis not present

## 2020-02-11 DIAGNOSIS — G44209 Tension-type headache, unspecified, not intractable: Secondary | ICD-10-CM | POA: Diagnosis not present

## 2020-02-11 DIAGNOSIS — M5441 Lumbago with sciatica, right side: Secondary | ICD-10-CM | POA: Diagnosis not present

## 2020-02-11 DIAGNOSIS — M9903 Segmental and somatic dysfunction of lumbar region: Secondary | ICD-10-CM | POA: Diagnosis not present

## 2020-02-16 ENCOUNTER — Encounter: Payer: Self-pay | Admitting: Internal Medicine

## 2020-02-18 MED ORDER — ALPRAZOLAM 0.25 MG PO TABS: 0.2500 mg | ORAL_TABLET | Freq: Every evening | ORAL | 2 refills | Status: DC | PRN

## 2020-03-09 DIAGNOSIS — G44209 Tension-type headache, unspecified, not intractable: Secondary | ICD-10-CM | POA: Diagnosis not present

## 2020-03-09 DIAGNOSIS — M9903 Segmental and somatic dysfunction of lumbar region: Secondary | ICD-10-CM | POA: Diagnosis not present

## 2020-03-09 DIAGNOSIS — M5441 Lumbago with sciatica, right side: Secondary | ICD-10-CM | POA: Diagnosis not present

## 2020-03-09 DIAGNOSIS — M542 Cervicalgia: Secondary | ICD-10-CM | POA: Diagnosis not present

## 2020-03-10 ENCOUNTER — Ambulatory Visit: Payer: BC Managed Care – PPO

## 2020-05-16 ENCOUNTER — Other Ambulatory Visit: Payer: Self-pay | Admitting: *Deleted

## 2020-05-16 ENCOUNTER — Encounter: Payer: Self-pay | Admitting: Internal Medicine

## 2020-05-16 MED ORDER — ALPRAZOLAM 0.25 MG PO TABS: 0.2500 mg | ORAL_TABLET | Freq: Every evening | ORAL | 2 refills | Status: DC | PRN

## 2020-06-08 DIAGNOSIS — M9901 Segmental and somatic dysfunction of cervical region: Secondary | ICD-10-CM | POA: Diagnosis not present

## 2020-06-08 DIAGNOSIS — M542 Cervicalgia: Secondary | ICD-10-CM | POA: Diagnosis not present

## 2020-06-08 DIAGNOSIS — M546 Pain in thoracic spine: Secondary | ICD-10-CM | POA: Diagnosis not present

## 2020-06-08 DIAGNOSIS — M5441 Lumbago with sciatica, right side: Secondary | ICD-10-CM | POA: Diagnosis not present

## 2020-06-13 ENCOUNTER — Telehealth: Payer: Self-pay | Admitting: *Deleted

## 2020-06-13 NOTE — Telephone Encounter (Signed)
Patient called in via sign language interpreter. C/o 3-4 day hx of N/V/D, fatigue and pain from right breast to back. Patient has been fully vaccinated for Covid. Understands importance of keeping hydrated. First available appt given tomorrow at 1015 with Red Team. Hubbard Hartshorn, BSN, RN-BC

## 2020-06-14 ENCOUNTER — Ambulatory Visit (HOSPITAL_COMMUNITY)
Admission: RE | Admit: 2020-06-14 | Discharge: 2020-06-14 | Disposition: A | Discharge status: Home / Self Care | Payer: BC Managed Care – PPO | Source: Ambulatory Visit | Attending: Internal Medicine | Admitting: Internal Medicine

## 2020-06-14 ENCOUNTER — Encounter: Payer: Self-pay | Admitting: Student

## 2020-06-14 ENCOUNTER — Ambulatory Visit: Payer: BC Managed Care – PPO | Admitting: Student

## 2020-06-14 ENCOUNTER — Other Ambulatory Visit: Payer: Self-pay | Admitting: Student

## 2020-06-14 ENCOUNTER — Other Ambulatory Visit: Payer: Self-pay

## 2020-06-14 VITALS — BP 127/82 | HR 74 | Temp 98.6°F | Ht 60.0 in | Wt 187.6 lb

## 2020-06-14 DIAGNOSIS — Z1231 Encounter for screening mammogram for malignant neoplasm of breast: Secondary | ICD-10-CM

## 2020-06-14 DIAGNOSIS — R112 Nausea with vomiting, unspecified: Secondary | ICD-10-CM

## 2020-06-14 DIAGNOSIS — R197 Diarrhea, unspecified: Secondary | ICD-10-CM

## 2020-06-14 DIAGNOSIS — E039 Hypothyroidism, unspecified: Secondary | ICD-10-CM | POA: Diagnosis not present

## 2020-06-14 DIAGNOSIS — R1011 Right upper quadrant pain: Secondary | ICD-10-CM | POA: Insufficient documentation

## 2020-06-14 DIAGNOSIS — R1013 Epigastric pain: Secondary | ICD-10-CM

## 2020-06-14 DIAGNOSIS — I700 Atherosclerosis of aorta: Secondary | ICD-10-CM | POA: Diagnosis not present

## 2020-06-14 LAB — URINALYSIS, ROUTINE W REFLEX MICROSCOPIC
Bilirubin Urine: NEGATIVE
Glucose, UA: NEGATIVE mg/dL
Hgb urine dipstick: NEGATIVE
Ketones, ur: NEGATIVE mg/dL
Leukocytes,Ua: NEGATIVE
Nitrite: NEGATIVE
Protein, ur: NEGATIVE mg/dL
Specific Gravity, Urine: 1.006 (ref 1.005–1.030)
pH: 7 (ref 5.0–8.0)

## 2020-06-14 LAB — CBC WITH DIFFERENTIAL/PLATELET
Abs Immature Granulocytes: 0.02 10*3/uL (ref 0.00–0.07)
Basophils Absolute: 0 10*3/uL (ref 0.0–0.1)
Basophils Relative: 0 %
Eosinophils Absolute: 0.2 10*3/uL (ref 0.0–0.5)
Eosinophils Relative: 4 %
HCT: 44.4 % (ref 36.0–46.0)
Hemoglobin: 14.4 g/dL (ref 12.0–15.0)
Immature Granulocytes: 0 %
Lymphocytes Relative: 32 %
Lymphs Abs: 2.2 10*3/uL (ref 0.7–4.0)
MCH: 30.2 pg (ref 26.0–34.0)
MCHC: 32.4 g/dL (ref 30.0–36.0)
MCV: 93.1 fL (ref 80.0–100.0)
Monocytes Absolute: 0.4 10*3/uL (ref 0.1–1.0)
Monocytes Relative: 7 %
Neutro Abs: 3.9 10*3/uL (ref 1.7–7.7)
Neutrophils Relative %: 57 %
Platelets: 298 10*3/uL (ref 150–400)
RBC: 4.77 MIL/uL (ref 3.87–5.11)
RDW: 11.7 % (ref 11.5–15.5)
WBC: 6.8 10*3/uL (ref 4.0–10.5)
nRBC: 0 % (ref 0.0–0.2)

## 2020-06-14 LAB — COMPREHENSIVE METABOLIC PANEL
ALT: 16 U/L (ref 0–44)
AST: 18 U/L (ref 15–41)
Albumin: 4 g/dL (ref 3.5–5.0)
Alkaline Phosphatase: 57 U/L (ref 38–126)
Anion gap: 10 (ref 5–15)
BUN: 11 mg/dL (ref 6–20)
CO2: 26 mmol/L (ref 22–32)
Calcium: 9.7 mg/dL (ref 8.9–10.3)
Chloride: 103 mmol/L (ref 98–111)
Creatinine, Ser: 0.69 mg/dL (ref 0.44–1.00)
GFR calc Af Amer: >60 mL/min (ref >60)
GFR calc non Af Amer: >60 mL/min (ref >60)
Glucose, Bld: 88 mg/dL (ref 70–99)
Potassium: 4.5 mmol/L (ref 3.5–5.1)
Sodium: 139 mmol/L (ref 135–145)
Total Bilirubin: 0.5 mg/dL (ref 0.3–1.2)
Total Protein: 7 g/dL (ref 6.5–8.1)

## 2020-06-14 LAB — AMYLASE: Amylase: 80 U/L (ref 28–100)

## 2020-06-14 LAB — LIPASE, BLOOD: Lipase: 35 U/L (ref 11–51)

## 2020-06-14 LAB — TSH: TSH: 1.963 u[IU]/mL (ref 0.350–4.500)

## 2020-06-14 MED ORDER — IOHEXOL 300 MG/ML  SOLN
100.0000 mL | Freq: Once | INTRAMUSCULAR | Status: AC | PRN
  Administered 2020-06-14: 100 mL via INTRAVENOUS

## 2020-06-14 MED ORDER — PROMETHAZINE HCL 12.5 MG PO TABS: 12.5000 mg | ORAL_TABLET | Freq: Four times a day (QID) | ORAL | 0 refills | Status: DC | PRN

## 2020-06-14 NOTE — Telephone Encounter (Signed)
Agree with plan.  Needs sign language interpreter when seen.

## 2020-06-14 NOTE — Patient Instructions (Signed)
Ms. Bellizzi,  It was a pleasure meeting you in clinic today.   Sincerely, Dr. Fausto Skillern, MD

## 2020-06-14 NOTE — Progress Notes (Signed)
   CC: Nausea, vomiting, diarrhea and abdominal pain.  HPI:  [name] is a 49 y.o. with past medical history of anxiety, IBS, GERD, recurrent C Diff infections, chronic migraines, chronic back pain, and deafness who presents to clinic for evaluation of nausea, vomiting, diarrhea and abdominal pain. Refer to problem list for Assessment/Plan based charting of this encounter.  Past Medical History:  Diagnosis Date  . Anxiety   . Clostridium difficile infection    x several times  . Deaf    uses sign language  . Endometriosis    s/p TAH, LSO  . Family history of malignant neoplasm of gastrointestinal tract   . Gallstones   . GERD (gastroesophageal reflux disease)   . Hearing difficulty    b/l, 2/2 congenital rubella s/p stapedectomy. Using hearing aid  . Hepatic cyst    6 mm on CT done done in 05/2005  . Hypothyroidism   . IBS (irritable bowel syndrome)   . Migraines    "frequency depends on my stress level" (07/23/2017)  . Obesity   . PONV (postoperative nausea and vomiting)    "sometimes; depends on how long I'm under"  . Shingles 03/2013   Review of Systems:  Endorses nausea, vomiting, diarrhea, abdominal pain, fevers, chills. Denies pain with urination, increased urinary frequency or urgency.  Physical Exam:  Vitals:   06/14/20 1044  BP: 99/71  Pulse: 85  Temp: 98.6 F (37 C)  TempSrc: Oral  SpO2: 100%  Weight: 187 lb 9.6 oz (85.1 kg)  Height: 5' (1.524 m)   Physical Exam Constitutional:      General: She is in acute distress.     Appearance: Normal appearance.  HENT:     Head: Normocephalic and atraumatic.  Eyes:     Extraocular Movements: Extraocular movements intact.     Conjunctiva/sclera: Conjunctivae normal.  Cardiovascular:     Rate and Rhythm: Normal rate and regular rhythm.     Pulses: Normal pulses.     Heart sounds: Normal heart sounds.  Pulmonary:     Effort: Pulmonary effort is normal.     Breath sounds: Normal breath sounds.  Abdominal:      General: Abdomen is flat. Bowel sounds are normal.     Tenderness: There is abdominal tenderness. There is right CVA tenderness.     Comments: Significant tenderness to palpation in epigastric and right upper quadrant.  Musculoskeletal:        General: No swelling. Normal range of motion.     Cervical back: Normal range of motion and neck supple.     Right lower leg: No edema.     Left lower leg: No edema.  Skin:    General: Skin is warm and dry.     Capillary Refill: Capillary refill takes less than 2 seconds.  Neurological:     General: No focal deficit present.     Mental Status: She is alert. Mental status is at baseline.  Psychiatric:        Mood and Affect: Mood normal.        Behavior: Behavior normal.        Thought Content: Thought content normal.        Judgment: Judgment normal.    Assessment & Plan:   See Encounters Tab for problem based charting.  Patient seen with Dr. Jimmye Norman

## 2020-06-14 NOTE — Assessment & Plan Note (Addendum)
Patient due for repeat TSH check. -Obtain TSH  ADDENDUM: TSH within normal limits, continue current medication regimen.

## 2020-06-14 NOTE — Assessment & Plan Note (Addendum)
Patient presents to clinic for evaluation of abdominal pain, nausea, vomiting and diarrhea. She states that approximately one week ago, she began to develop persistent nausea and the sensation of abdominal fullness particularly in her right upper quadrant. These symptoms progressively worsened until Saturday night, when she began to experience recurrent episodes of profuse, watery, foul-smelling diarrhea. She states that since Saturday evening, she has been having approximately six bowel movements per day along with daily vomiting. During this time, her abdominal pain has progressively worsened and is localized to her upper and right upper quadrant of her abdomen. The pain radiates to her right-back. She reports feeling feverish and having chills last night. She denies any recent sick contacts or similar symptoms with her husband.  Vitals:   06/14/20 1044  BP: 99/71  Pulse: 85  Temp: 98.6 F (37 C)  SpO2: 100%   Physical Examination: Patient in significant distress with significant epigastric and right upper quadrant abdominal tenderness to light palpation, along with significant right CVA tenderness.  ASSESSMENT/PLAN: Patient's severe abdominal pain, nausea, vomiting and diarrhea are concerning for a wide variety of conditions including but not limited to gastroenteritis, inflammatory bowel disease, IBS, acute hepatitis, biliary disease, kidney stone, pyelonephritis, pancreatitis, and acute liver injury. She requires urgent workup of her condition due to the severity of her clinical condition. -Stat labs:  -CMP  -CBC  -Amylase/Lipase  -Urinalysis  -GIP panel  -C. Diff  -CT Abdomen.Pelvis  ADDENDUM: CBC, CMP, amylase, lipase, urinalysis all unremarkable. GIP, C. Diff and CTAP pending. Spoke with radiology team at [facility] to discuss plan of care for patient. Internal Medicine Teaching Service resident on-call will be contacted with results of CT Abdomen once resulted. In the meantime,  patient will remain on-site pending results. If imaging is unremarkable for an acute intra-abdominal process, patient should be stable for discharge home. However, the on-call resident may determine whether admission is warranted based on results of imaging study.  ADDENDUM 2: Patient's CT Abdomen reveals no acute intra-abdominal process responsible for patient's symptoms. Her constellation of symptoms is likely secondary to gastroenteritis. She would benefit from staying hydrated over the next several days as her symptoms gradually improve. I will also refill her prescribed phenergan for nausea and vomiting. If her symptoms are to worsen or continue beyond the next several days, she has been encouraged to call our clinic or present to the ED.

## 2020-06-15 LAB — GASTROINTESTINAL PANEL BY PCR, STOOL (REPLACES STOOL CULTURE)

## 2020-06-15 LAB — C DIFFICILE QUICK SCREEN W PCR REFLEX
C Diff antigen: NEGATIVE
C Diff interpretation: NOT DETECTED
C Diff toxin: NEGATIVE

## 2020-06-21 NOTE — Progress Notes (Signed)
DOS 06/14/20:  Internal Medicine Clinic Attending  I saw and evaluated the patient.  I personally confirmed the key portions of the history and exam documented by Dr. Wynetta Emery and I reviewed pertinent patient test results.  The assessment, diagnosis, and plan were formulated together and I agree with the documentation in the resident's note.

## 2020-07-06 ENCOUNTER — Encounter: Payer: Self-pay | Admitting: Internal Medicine

## 2020-07-06 DIAGNOSIS — K921 Melena: Secondary | ICD-10-CM

## 2020-07-06 MED ORDER — BUSPIRONE HCL 10 MG PO TABS
20.0000 mg | ORAL_TABLET | Freq: Three times a day (TID) | ORAL | 5 refills | Status: DC
Start: 2020-07-06 — End: 2020-12-21

## 2020-07-06 MED ORDER — TRAMADOL HCL 50 MG PO TABS: ORAL_TABLET | ORAL | 5 refills | Status: DC

## 2020-07-29 ENCOUNTER — Telehealth: Payer: Self-pay | Admitting: *Deleted

## 2020-07-29 NOTE — Telephone Encounter (Signed)
Information was sent through CoverMyMeds for [place] for Tramadol 50 mg tablets. Approved 07/29/2020 thru 07/29/2021.  Sander Nephew, RN 07/29/2020 11:42 AM.

## 2020-08-09 DIAGNOSIS — H838X2 Other specified diseases of left inner ear: Secondary | ICD-10-CM | POA: Diagnosis not present

## 2020-08-09 DIAGNOSIS — H9193 Unspecified hearing loss, bilateral: Secondary | ICD-10-CM | POA: Diagnosis not present

## 2020-08-09 DIAGNOSIS — H903 Sensorineural hearing loss, bilateral: Secondary | ICD-10-CM | POA: Diagnosis not present

## 2020-08-16 ENCOUNTER — Encounter: Payer: Self-pay | Admitting: Internal Medicine

## 2020-08-16 ENCOUNTER — Other Ambulatory Visit: Payer: Self-pay | Admitting: *Deleted

## 2020-08-16 MED ORDER — ALPRAZOLAM 0.25 MG PO TABS: 0.2500 mg | ORAL_TABLET | Freq: Every evening | ORAL | 2 refills | Status: DC | PRN

## 2020-08-27 ENCOUNTER — Encounter: Payer: Self-pay | Admitting: Internal Medicine

## 2020-08-27 DIAGNOSIS — K219 Gastro-esophageal reflux disease without esophagitis: Secondary | ICD-10-CM

## 2020-08-30 MED ORDER — OMEPRAZOLE 20 MG PO CPDR: DELAYED_RELEASE_CAPSULE | ORAL | 6 refills | Status: DC

## 2020-09-23 ENCOUNTER — Encounter: Payer: Self-pay | Admitting: Internal Medicine

## 2020-09-23 DIAGNOSIS — E039 Hypothyroidism, unspecified: Secondary | ICD-10-CM

## 2020-09-23 MED ORDER — LEVOTHYROXINE SODIUM 25 MCG PO TABS: 25.0000 ug | ORAL_TABLET | Freq: Every day | ORAL | 3 refills | Status: DC

## 2020-10-19 ENCOUNTER — Encounter: Payer: Self-pay | Admitting: Internal Medicine

## 2020-11-14 ENCOUNTER — Encounter: Payer: Self-pay | Admitting: Internal Medicine

## 2020-11-15 MED ORDER — ALPRAZOLAM 0.25 MG PO TABS: 0.2500 mg | ORAL_TABLET | Freq: Every evening | ORAL | 2 refills | Status: DC | PRN

## 2020-12-21 ENCOUNTER — Other Ambulatory Visit: Payer: Self-pay | Admitting: Internal Medicine

## 2020-12-21 ENCOUNTER — Encounter: Payer: Self-pay | Admitting: Internal Medicine

## 2020-12-21 DIAGNOSIS — K921 Melena: Secondary | ICD-10-CM

## 2020-12-21 DIAGNOSIS — N951 Menopausal and female climacteric states: Secondary | ICD-10-CM

## 2020-12-21 DIAGNOSIS — E039 Hypothyroidism, unspecified: Secondary | ICD-10-CM

## 2020-12-21 MED ORDER — LEVOTHYROXINE SODIUM 25 MCG PO TABS: 25.0000 ug | ORAL_TABLET | Freq: Every day | ORAL | 3 refills | Status: DC

## 2021-01-19 ENCOUNTER — Encounter: Payer: Self-pay | Admitting: Internal Medicine

## 2021-01-20 ENCOUNTER — Other Ambulatory Visit: Payer: Self-pay

## 2021-01-20 DIAGNOSIS — N951 Menopausal and female climacteric states: Secondary | ICD-10-CM

## 2021-01-20 DIAGNOSIS — K921 Melena: Secondary | ICD-10-CM

## 2021-01-20 NOTE — Telephone Encounter (Signed)
  busPIRone (BUSPAR) 10 MG tablet   traMADol (ULTRAM) 50 MG tablet  estradiol (ESTRACE) 1 MG tablet, REFILL REQUEST @  [facility] - [place] ([place]), [place] - [address] Phone:  [phone]  Fax:  [phone]     Pt requesting medication for yeast infection. Please call pt back.

## 2021-01-23 MED ORDER — ESTRADIOL 1 MG PO TABS: 1.0000 mg | ORAL_TABLET | Freq: Every day | ORAL | 6 refills | Status: DC

## 2021-01-23 MED ORDER — FLUCONAZOLE 150 MG PO TABS: 150.0000 mg | ORAL_TABLET | ORAL | 0 refills | Status: DC

## 2021-01-23 MED ORDER — BUSPIRONE HCL 10 MG PO TABS: 20.0000 mg | ORAL_TABLET | Freq: Three times a day (TID) | ORAL | 3 refills | Status: DC

## 2021-01-23 MED ORDER — TRAMADOL HCL 50 MG PO TABS: 50.0000 mg | ORAL_TABLET | Freq: Four times a day (QID) | ORAL | 0 refills | Status: DC | PRN

## 2021-02-16 ENCOUNTER — Encounter: Payer: Self-pay | Admitting: Internal Medicine

## 2021-02-17 MED ORDER — ALPRAZOLAM 0.25 MG PO TABS: 0.2500 mg | ORAL_TABLET | Freq: Every evening | ORAL | 2 refills | Status: DC | PRN

## 2021-02-24 ENCOUNTER — Other Ambulatory Visit: Payer: Self-pay | Admitting: Internal Medicine

## 2021-02-24 DIAGNOSIS — K921 Melena: Secondary | ICD-10-CM

## 2021-02-24 MED ORDER — TRAMADOL HCL 50 MG PO TABS: 50.0000 mg | ORAL_TABLET | Freq: Four times a day (QID) | ORAL | 0 refills | Status: DC | PRN

## 2021-03-21 ENCOUNTER — Encounter: Payer: Self-pay | Admitting: *Deleted

## 2021-03-30 ENCOUNTER — Encounter: Payer: Self-pay | Admitting: Internal Medicine

## 2021-03-30 ENCOUNTER — Telehealth: Payer: Self-pay | Admitting: Internal Medicine

## 2021-03-30 DIAGNOSIS — K921 Melena: Secondary | ICD-10-CM

## 2021-03-31 ENCOUNTER — Other Ambulatory Visit: Payer: Self-pay | Admitting: Internal Medicine

## 2021-03-31 DIAGNOSIS — K921 Melena: Secondary | ICD-10-CM

## 2021-03-31 MED ORDER — TRAMADOL HCL 50 MG PO TABS: 50.0000 mg | ORAL_TABLET | Freq: Four times a day (QID) | ORAL | 0 refills | Status: DC | PRN

## 2021-04-05 ENCOUNTER — Encounter: Payer: Self-pay | Admitting: Internal Medicine

## 2021-04-14 MED ORDER — PREDNISONE 20 MG PO TABS
20.0000 mg | ORAL_TABLET | Freq: Every day | ORAL | 0 refills | Status: DC
Start: 2021-04-14 — End: 2021-05-16

## 2021-04-21 MED ORDER — TRAMADOL HCL 50 MG PO TABS: 50.0000 mg | ORAL_TABLET | Freq: Four times a day (QID) | ORAL | 0 refills | Status: DC | PRN

## 2021-04-21 NOTE — Telephone Encounter (Signed)
Refill sent.  Gilles Chiquito, MD

## 2021-04-27 ENCOUNTER — Other Ambulatory Visit: Payer: Self-pay | Admitting: *Deleted

## 2021-04-27 MED ORDER — BUSPIRONE HCL 10 MG PO TABS: 20.0000 mg | ORAL_TABLET | Freq: Three times a day (TID) | ORAL | 3 refills | Status: DC

## 2021-04-27 NOTE — Telephone Encounter (Signed)
Last visit 06/14/2020  Next appt scheduled for 06/30/21 w/pcp

## 2021-05-04 ENCOUNTER — Telehealth: Payer: Self-pay | Admitting: *Deleted

## 2021-05-05 ENCOUNTER — Encounter: Payer: BC Managed Care – PPO | Admitting: Student

## 2021-05-15 ENCOUNTER — Ambulatory Visit (HOSPITAL_COMMUNITY)
Admission: RE | Admit: 2021-05-15 | Discharge: 2021-05-15 | Disposition: A | Discharge status: Home / Self Care | Payer: BC Managed Care – PPO | Source: Ambulatory Visit | Attending: Internal Medicine | Admitting: Internal Medicine

## 2021-05-15 ENCOUNTER — Other Ambulatory Visit: Payer: Self-pay

## 2021-05-15 ENCOUNTER — Telehealth: Payer: Self-pay | Admitting: *Deleted

## 2021-05-15 ENCOUNTER — Other Ambulatory Visit: Payer: Self-pay | Admitting: Internal Medicine

## 2021-05-15 ENCOUNTER — Ambulatory Visit (INDEPENDENT_AMBULATORY_CARE_PROVIDER_SITE_OTHER): Payer: BC Managed Care – PPO | Admitting: Student

## 2021-05-15 ENCOUNTER — Encounter: Payer: Self-pay | Admitting: Student

## 2021-05-15 VITALS — BP 140/99 | HR 93 | Temp 98.2°F | Ht 60.0 in | Wt 186.6 lb

## 2021-05-15 DIAGNOSIS — U071 COVID-19: Secondary | ICD-10-CM | POA: Diagnosis not present

## 2021-05-15 DIAGNOSIS — R5382 Chronic fatigue, unspecified: Secondary | ICD-10-CM | POA: Diagnosis not present

## 2021-05-15 DIAGNOSIS — K219 Gastro-esophageal reflux disease without esophagitis: Secondary | ICD-10-CM

## 2021-05-15 DIAGNOSIS — U099 Post covid-19 condition, unspecified: Secondary | ICD-10-CM

## 2021-05-15 DIAGNOSIS — Z0000 Encounter for general adult medical examination without abnormal findings: Secondary | ICD-10-CM

## 2021-05-15 DIAGNOSIS — R059 Cough, unspecified: Secondary | ICD-10-CM | POA: Diagnosis not present

## 2021-05-15 DIAGNOSIS — R0602 Shortness of breath: Secondary | ICD-10-CM | POA: Diagnosis not present

## 2021-05-15 MED ORDER — ALPRAZOLAM 0.25 MG PO TABS: 0.2500 mg | ORAL_TABLET | Freq: Every evening | ORAL | 2 refills | Status: DC | PRN

## 2021-05-15 MED ORDER — HYDROCODONE-GUAIFENESIN 2.5-200 MG/5ML PO SOLN: 5.0000 mL | Freq: Every evening | ORAL | 0 refills | Status: DC | PRN

## 2021-05-15 MED ORDER — GUAIFENESIN-CODEINE 200-10 MG/5ML PO LIQD: 5.0000 mL | Freq: Every evening | ORAL | 0 refills | Status: DC | PRN

## 2021-05-15 MED ORDER — GUAIFENESIN-CODEINE 100-6.3 MG/5ML PO SOLN: 5.0000 mL | Freq: Two times a day (BID) | ORAL | 0 refills | Status: DC | PRN

## 2021-05-15 NOTE — Progress Notes (Signed)
Xanax refill approved.

## 2021-05-15 NOTE — Telephone Encounter (Signed)
Call from the pharmacist at [facility] - stated they received rx for guaiFENesin-Codeine 200-10 MG/5ML LIQD but they only have 100-10 MG/5ML. Please send new rx  Pt is currently at the store. Thanks

## 2021-05-15 NOTE — Progress Notes (Signed)
   CC: Persistent COVID symptoms, persistent COVID positive   HPI:  [name] is a 50 y.o. F with PMH per below. Please see assessment and plan under notes tab for further details.    Past Medical History:  Diagnosis Date   Anxiety    Clostridium difficile infection    x several times   Deaf    uses sign language   Endometriosis    s/p TAH, LSO   Family history of malignant neoplasm of gastrointestinal tract    Gallstones    GERD (gastroesophageal reflux disease)    Hearing difficulty    b/l, 2/2 congenital rubella s/p stapedectomy. Using hearing aid   Hepatic cyst    6 mm on CT done done in 05/2005   Hypothyroidism    IBS (irritable bowel syndrome)    Migraines    "frequency depends on my stress level" (07/23/2017)   Obesity    PONV (postoperative nausea and vomiting)    "sometimes; depends on how long I'm under"   Shingles 03/2013   Review of Systems:  Please see assessment and plan under notes tab for further details.    Physical Exam:  Vitals:   05/15/21 1327  BP: (!) 140/99  Pulse: 93  Temp: 98.2 F (36.8 C)  TempSrc: Oral  SpO2: 100%  Weight: 186 lb 9.6 oz (84.6 kg)  Height: 5' (1.524 m)   Gen: NAD  CV: RRR Pulm: Non labored breathing on RA, no wheezing or crackles  Abd: Soft, NT, ND Ext: No edema of BLE  Neuro: No focal deficits  Assessment & Plan:   See Encounters Tab for problem based charting.  Patient discussed with Dr. Jimmye Norman

## 2021-05-15 NOTE — Patient Instructions (Signed)
It was a pleasure meeting you.   Your symptoms are likely due to long covid. A syndrome where your symptoms could persist for some time. We will try and treat your symptoms.   We will get a chest x ray as well.

## 2021-05-15 NOTE — Telephone Encounter (Signed)
Betty at [facility] called in stating they do not have HYDROcodone-guaiFENesin. Only carry guaifennesin with Codiene 100 mg-10 mg/5 mL. Please resend Rx if appropriate.

## 2021-05-16 DIAGNOSIS — G9332 Myalgic encephalomyelitis/chronic fatigue syndrome: Secondary | ICD-10-CM | POA: Insufficient documentation

## 2021-05-16 DIAGNOSIS — U099 Post covid-19 condition, unspecified: Secondary | ICD-10-CM | POA: Insufficient documentation

## 2021-05-16 HISTORY — DX: Myalgic encephalomyelitis/chronic fatigue syndrome: G93.32

## 2021-05-16 NOTE — Assessment & Plan Note (Signed)
Patient reports that she first tested positive for COVID around 7/20. Her symptoms at that time included subjective fevers, increased frequency of her baseline migraines, runny nose, myalgias, productive cough, diarrhea, SOB, and loss of taste and smell. Patient states that her symptoms improved after she took a 5 d course of prednisone. She was able to walk 7m daily. However the following week she started to feel more short of breath, had chest wall pain from her coughing, increased coughing spells, and fatigue. She has tried ibuprofen and tylenol for her chest wall pain. She has also tried mucinex DM to minimal effect for her cough. Patient does note when her symptoms first started she was prescribed hydrocodone cough syrup which helped suppress her cough at night and a CXR was obtained at that time which was normal. Patient does report that she had a subjective fever about a week prior to this clinic visit.  On exam patient's O2 saturation is 100% on RA, she has no wheezing or rales.  Of note, since the onset of her symptoms patient has taken a weekly COVID antigen test which has been positive. Patient is vaccinated with Moderna x2 shots.   A/P: Patient likely experiencing long COVID given her persistent fatigue, shortness of breath, and cough. Unfortunately there are no specific interventions that can help with this. Discussed with patient that we will attempt to minimize the impact of her symptoms on her quality of life as she is unable to perform her job as well as she would like. Also considered possible bacterial pneumonia given patient's persistent symptoms but she is afebrile and has a normal pulmonary exam this clinic visit. Moreover, chest x ray obtained this visit showed no evidence of infiltrate or consolidation.  -Prescribed 7d course of codeine-guafenesin for patient's cough -Patient's persistent positive COVID antigen test not likely associated with reinfection as she has tested positive  weekly for about a month. Discussed with patient to continue to wear a mask, the importance of good hand hygiene and to let [place] if her employer will require a note from [place]

## 2021-05-16 NOTE — Progress Notes (Signed)
Internal Medicine Clinic Attending  Case discussed with Dr. Carter  At the time of the visit.  We reviewed the resident's history and exam and pertinent patient test results.  I agree with the assessment, diagnosis, and plan of care documented in the resident's note.  

## 2021-05-17 MED ORDER — CHERATUSSIN AC 100-10 MG/5ML PO SOLN: 5.0000 mL | Freq: Three times a day (TID) | ORAL | 0 refills | Status: DC | PRN

## 2021-05-17 NOTE — Addendum Note (Signed)
Addended by: Althea Grimmer on: 05/17/2021 05:23 PM   Modules accepted: Orders

## 2021-05-24 ENCOUNTER — Other Ambulatory Visit: Payer: Self-pay | Admitting: Internal Medicine

## 2021-05-24 ENCOUNTER — Encounter: Payer: Self-pay | Admitting: Internal Medicine

## 2021-05-24 DIAGNOSIS — K921 Melena: Secondary | ICD-10-CM

## 2021-05-24 DIAGNOSIS — K219 Gastro-esophageal reflux disease without esophagitis: Secondary | ICD-10-CM

## 2021-05-25 ENCOUNTER — Encounter: Payer: Self-pay | Admitting: Internal Medicine

## 2021-05-25 MED ORDER — TRAMADOL HCL 50 MG PO TABS: 50.0000 mg | ORAL_TABLET | Freq: Four times a day (QID) | ORAL | 0 refills | Status: DC | PRN

## 2021-06-27 ENCOUNTER — Other Ambulatory Visit: Payer: Self-pay | Admitting: Internal Medicine

## 2021-06-27 DIAGNOSIS — K219 Gastro-esophageal reflux disease without esophagitis: Secondary | ICD-10-CM

## 2021-06-28 NOTE — Telephone Encounter (Signed)
Next appt scheduled 06/30/21 with PCP.

## 2021-06-30 ENCOUNTER — Encounter: Payer: Self-pay | Admitting: Internal Medicine

## 2021-06-30 ENCOUNTER — Other Ambulatory Visit: Payer: Self-pay

## 2021-06-30 ENCOUNTER — Ambulatory Visit (INDEPENDENT_AMBULATORY_CARE_PROVIDER_SITE_OTHER): Payer: BC Managed Care – PPO | Admitting: Internal Medicine

## 2021-06-30 VITALS — BP 142/92 | HR 79 | Temp 98.5°F | Ht 60.0 in | Wt 189.6 lb

## 2021-06-30 DIAGNOSIS — E038 Other specified hypothyroidism: Secondary | ICD-10-CM | POA: Diagnosis not present

## 2021-06-30 DIAGNOSIS — Z0000 Encounter for general adult medical examination without abnormal findings: Secondary | ICD-10-CM

## 2021-06-30 DIAGNOSIS — Z9621 Cochlear implant status: Secondary | ICD-10-CM

## 2021-06-30 DIAGNOSIS — H6691 Otitis media, unspecified, right ear: Secondary | ICD-10-CM | POA: Diagnosis not present

## 2021-06-30 DIAGNOSIS — R112 Nausea with vomiting, unspecified: Secondary | ICD-10-CM

## 2021-06-30 DIAGNOSIS — H81399 Other peripheral vertigo, unspecified ear: Secondary | ICD-10-CM | POA: Diagnosis not present

## 2021-06-30 DIAGNOSIS — R197 Diarrhea, unspecified: Secondary | ICD-10-CM

## 2021-06-30 DIAGNOSIS — M26621 Arthralgia of right temporomandibular joint: Secondary | ICD-10-CM

## 2021-06-30 MED ORDER — CEFDINIR 300 MG PO CAPS: 300.0000 mg | ORAL_CAPSULE | Freq: Two times a day (BID) | ORAL | 0 refills | Status: AC

## 2021-06-30 MED ORDER — MECLIZINE HCL 50 MG PO TABS: 50.0000 mg | ORAL_TABLET | Freq: Three times a day (TID) | ORAL | 0 refills | Status: DC | PRN

## 2021-06-30 MED ORDER — PROMETHAZINE HCL 12.5 MG PO TABS: 12.5000 mg | ORAL_TABLET | Freq: Four times a day (QID) | ORAL | 0 refills | Status: DC | PRN

## 2021-06-30 NOTE — Progress Notes (Signed)
Subjective:    Patient ID: Donna Davis, female    DOB: 09-11-1971, 50 y.o.   MRN: [id number]  CC: Vertigo, falls  HPI  Donna Davis is a 50 year old woman with chronic deafness, hypothyroidism, migraine, IBS, gerd, anxiety who presents for follow up.   Donna Davis reports that she has been struggling with balance and falls.  This started about 6 weeks ago.  She had a bad fall and broke her foot in 2 places about 5 weeks ago.  She has been in a walking boot until recently.  She notes 2 weeks of pressure and pain on the right postauricular area at the site of her cochlear implant, no swelling.  She further notes bad vertigo for 3 days and ear pain "like ear is on fire" for 2 days.  She went to urgent care a couple of days ago and has been on Augmentin with no improvement.  She notes no drainage from the ear.  Every time she has fallen, she has not blacked out or lost consciousness.  She has had no palpitations.  When vertigo comes on, she has nausea and vomiting.  She has not been able to drive.  Notes the symptoms feel like she is drunk or that the floor is moving when she is sitting still.   She has had a very long battle with COVID and is finally no longer testing COVID negative after 3 months.   She brought her medications in today and we reviewed them.    Review of Systems  Constitutional:  Positive for activity change (due to vertigo). Negative for chills, diaphoresis and fatigue.  HENT:  Positive for ear pain, postnasal drip and rhinorrhea. Negative for ear discharge, facial swelling, sinus pressure and sinus pain.   Eyes:  Negative for discharge and visual disturbance.  Respiratory:  Negative for cough and shortness of breath.   Cardiovascular:  Negative for chest pain and palpitations.  Skin:  Negative for color change and rash.  Neurological:  Positive for dizziness, light-headedness and headaches. Negative for syncope, facial asymmetry, weakness and numbness.  Psychiatric/Behavioral:   Negative for decreased concentration and dysphoric mood.       Objective:   Physical Exam Constitutional:      General: She is not in acute distress.    Appearance: Normal appearance. She is obese. She is not toxic-appearing.  HENT:     Head: Normocephalic. No contusion or laceration. Hair is normal.      Right Ear: Decreased hearing noted. Tenderness present. No drainage. No middle ear effusion. There is no impacted cerumen. No foreign body. There is mastoid tenderness. Tympanic membrane is injected and erythematous.     Left Ear: Tympanic membrane normal. Decreased hearing noted. No drainage or tenderness.  No middle ear effusion. There is no impacted cerumen. No foreign body. No mastoid tenderness. No hemotympanum.     Ears:     Comments: Pain with manipulation of pinna on the right and erythematous ear canal.     Nose: Mucosal edema and rhinorrhea present.     Right Turbinates: Enlarged and swollen.     Left Turbinates: Enlarged and swollen.     Mouth/Throat:     Lips: Pink.     Mouth: Mucous membranes are moist. No injury.     Tongue: No lesions. Tongue does not deviate from midline.     Pharynx: Oropharynx is clear. Uvula midline. No oropharyngeal exudate or posterior oropharyngeal erythema.  Eyes:  General: Lids are normal.        Right eye: No discharge.        Left eye: No discharge.     Comments: She had severe dizziness and vertigo with downward and rightward gaze  Cardiovascular:     Rate and Rhythm: Normal rate and regular rhythm.     Heart sounds: No murmur heard. Pulmonary:     Effort: Pulmonary effort is normal. No respiratory distress.     Breath sounds: Normal breath sounds. No wheezing.  Musculoskeletal:        General: No swelling or tenderness.     Cervical back: Normal range of motion. No rigidity.  Lymphadenopathy:     Cervical: No cervical adenopathy.  Neurological:     Mental Status: She is alert and oriented to person, place, and time.     Cranial  Nerves: No cranial nerve deficit, dysarthria or facial asymmetry.     Motor: Motor function is intact. No weakness or abnormal muscle tone.     Coordination: Coordination abnormal. Finger-Nose-Finger Test abnormal (slowed and missed at times). Heel to [facility] Test normal. Rapid alternating movements normal.     Gait: Tandem walk abnormal (unable to complete).  Psychiatric:        Mood and Affect: Mood normal.        Behavior: Behavior normal.    CMET, CBC< TSH CT head and temporal bones      Assessment & Plan:  Return virtually in 2-3 weeks, sooner if needed

## 2021-06-30 NOTE — Assessment & Plan Note (Signed)
This was the main thing we discussed to day.  Her right ear drum certainly looks red, irritated and possibly with infection compared to the left.  She has pain over her cochlear implant and increased vertigo.  This could be a sign of acute infection.  I will plan to broaden her Abx today.  I also worry about vestibular issues such as true BPPV or an intracranial issues with her CBL.    Plan Change Abx to Cefdinir, phenergan to take with Gave instructions for Epley maneuver Short course of meclizine Pain control with tramadol (previous Rx) Asked her to contact her surgeon CT head and temporal bone ordered stat

## 2021-06-30 NOTE — Assessment & Plan Note (Signed)
She is due for mammogram which I advised her to schedule.

## 2021-06-30 NOTE — Patient Instructions (Addendum)
[name] - -  For your pain and likely ear infection please STOP augmentin and take Cefdinir 300mg  twice a day for 7 days.  Please call your surgeon regarding your implant and for other advice.   I have sent in Phenergan for you take with the cefdinir.   For your dizziness, ear infection and vertigo - we will do a CT scan of the head and temporal bones to make sure there is nothing worse going on.  I have ordered these to be done STAT so that you can schedule soon.   Also for dizziness, you can try meclizine as this might help if vertigo is from the inner ear.    Attached is information on exercises in bed you can try for vertigo, if it is from the inner ear called the Epley Maneuver.   You are due for your mammogram.  Please schedule this as you can.   Please come back to see me or send a mychart message for an appointment in 2-3 weeks to make sure you are improving.  Please also let me know what your surgeon recommends.

## 2021-06-30 NOTE — Assessment & Plan Note (Signed)
She is taking her synthroid, she is due for a TSH check and this was placed today.

## 2021-07-01 LAB — CMP14 + ANION GAP
ALT: 13 IU/L (ref 0–32)
AST: 15 IU/L (ref 0–40)
Albumin/Globulin Ratio: 2 (ref 1.2–2.2)
Albumin: 4.6 g/dL (ref 3.8–4.9)
Alkaline Phosphatase: 64 IU/L (ref 44–121)
Anion Gap: 14 mmol/L (ref 10.0–18.0)
BUN/Creatinine Ratio: 11 (ref 9–23)
BUN: 7 mg/dL (ref 6–24)
Bilirubin Total: 0.3 mg/dL (ref 0.0–1.2)
CO2: 23 mmol/L (ref 20–29)
Calcium: 9.8 mg/dL (ref 8.7–10.2)
Chloride: 103 mmol/L (ref 96–106)
Creatinine, Ser: 0.65 mg/dL (ref 0.57–1.00)
Globulin, Total: 2.3 g/dL (ref 1.5–4.5)
Glucose: 83 mg/dL (ref 70–99)
Potassium: 4.3 mmol/L (ref 3.5–5.2)
Sodium: 140 mmol/L (ref 134–144)
Total Protein: 6.9 g/dL (ref 6.0–8.5)
eGFR: 106 mL/min/{1.73_m2} (ref >59)

## 2021-07-01 LAB — CBC WITH DIFFERENTIAL/PLATELET
Basophils Absolute: 0 10*3/uL (ref 0.0–0.2)
Basos: 0 %
EOS (ABSOLUTE): 0.2 10*3/uL (ref 0.0–0.4)
Eos: 3 %
Hematocrit: 40.1 % (ref 34.0–46.6)
Hemoglobin: 13.8 g/dL (ref 11.1–15.9)
Immature Grans (Abs): 0 10*3/uL (ref 0.0–0.1)
Immature Granulocytes: 0 %
Lymphocytes Absolute: 2.5 10*3/uL (ref 0.7–3.1)
Lymphs: 37 %
MCH: 31.2 pg (ref 26.6–33.0)
MCHC: 34.4 g/dL (ref 31.5–35.7)
MCV: 91 fL (ref 79–97)
Monocytes Absolute: 0.5 10*3/uL (ref 0.1–0.9)
Monocytes: 7 %
Neutrophils Absolute: 3.7 10*3/uL (ref 1.4–7.0)
Neutrophils: 53 %
Platelets: 296 10*3/uL (ref 150–450)
RBC: 4.42 x10E6/uL (ref 3.77–5.28)
RDW: 12.3 % (ref 11.7–15.4)
WBC: 6.9 10*3/uL (ref 3.4–10.8)

## 2021-07-01 LAB — TSH: TSH: 1.56 u[IU]/mL (ref 0.450–4.500)

## 2021-07-05 ENCOUNTER — Encounter: Payer: Self-pay | Admitting: Internal Medicine

## 2021-07-06 ENCOUNTER — Encounter: Payer: Self-pay | Admitting: Internal Medicine

## 2021-07-06 MED ORDER — FLUCONAZOLE 150 MG PO TABS: ORAL_TABLET | ORAL | 0 refills | Status: DC

## 2021-07-06 NOTE — Addendum Note (Signed)
Addended by: Gilles Chiquito B on: 07/06/2021 05:30 PM   Modules accepted: Orders

## 2021-07-06 NOTE — Telephone Encounter (Signed)
Also unable to sch the current order- Per radiology this order must be corrected an changed to without and then I must contact her ins to correct the authorization again.    CT TEMPORAL BONES W CONTRAST  Linked chargeables: CHG CT SCAN SKULL CONTRAST [60630 (CPT)]  HC CT ORBIT TEMPORAL W CM [[id number]]  CHG CT SCAN SKULL CONTRAST [35573 (CPT)]    Also the patient's 2nd order is stating per Radiology that medicare will not cover the Dx Code's for this order placed.     CT HEAD WO CONTRAST (5MM)  Linked chargeables: CHG CT SCAN,HEAD/BRAIN,W/O CONTRAST MATL [22025 (CPT)]  HC CT HEAD WO CM [[id number]]  CHG CT SCAN,HEAD/BRAIN,W/O CONTRAST MATL [42706 (CPT)]   Please as the only that can be sch is the Ct Head but may not be covered.

## 2021-07-06 NOTE — Telephone Encounter (Signed)
Yes. The patient has Donna Davis was not locating the [place] for our Facility or Doctors.

## 2021-07-07 ENCOUNTER — Ambulatory Visit (HOSPITAL_BASED_OUTPATIENT_CLINIC_OR_DEPARTMENT_OTHER)
Admission: RE | Admit: 2021-07-07 | Discharge: 2021-07-07 | Disposition: A | Discharge status: Home / Self Care | Payer: BC Managed Care – PPO | Source: Ambulatory Visit | Attending: Internal Medicine | Admitting: Internal Medicine

## 2021-07-07 ENCOUNTER — Encounter: Payer: Self-pay | Admitting: Internal Medicine

## 2021-07-07 ENCOUNTER — Other Ambulatory Visit: Payer: Self-pay

## 2021-07-07 DIAGNOSIS — Z9621 Cochlear implant status: Secondary | ICD-10-CM | POA: Insufficient documentation

## 2021-07-07 DIAGNOSIS — H6691 Otitis media, unspecified, right ear: Secondary | ICD-10-CM | POA: Diagnosis not present

## 2021-07-07 DIAGNOSIS — H81399 Other peripheral vertigo, unspecified ear: Secondary | ICD-10-CM | POA: Insufficient documentation

## 2021-07-07 DIAGNOSIS — R42 Dizziness and giddiness: Secondary | ICD-10-CM | POA: Diagnosis not present

## 2021-07-07 DIAGNOSIS — R519 Headache, unspecified: Secondary | ICD-10-CM | POA: Diagnosis not present

## 2021-07-13 ENCOUNTER — Encounter: Payer: Self-pay | Admitting: Internal Medicine

## 2021-07-13 ENCOUNTER — Other Ambulatory Visit: Payer: Self-pay | Admitting: Internal Medicine

## 2021-07-13 DIAGNOSIS — K921 Melena: Secondary | ICD-10-CM

## 2021-07-13 MED ORDER — TRAMADOL HCL 50 MG PO TABS: 50.0000 mg | ORAL_TABLET | Freq: Four times a day (QID) | ORAL | 0 refills | Status: DC | PRN

## 2021-07-17 ENCOUNTER — Other Ambulatory Visit: Payer: Self-pay

## 2021-07-17 ENCOUNTER — Other Ambulatory Visit (HOSPITAL_COMMUNITY)
Admission: RE | Admit: 2021-07-17 | Discharge: 2021-07-17 | Disposition: A | Discharge status: Home / Self Care | Payer: BC Managed Care – PPO | Source: Ambulatory Visit | Attending: Internal Medicine | Admitting: Internal Medicine

## 2021-07-17 ENCOUNTER — Ambulatory Visit (INDEPENDENT_AMBULATORY_CARE_PROVIDER_SITE_OTHER): Payer: BC Managed Care – PPO | Admitting: Internal Medicine

## 2021-07-17 ENCOUNTER — Encounter: Payer: Self-pay | Admitting: Internal Medicine

## 2021-07-17 VITALS — BP 147/91 | HR 87 | Temp 99.1°F | Ht 60.0 in | Wt 188.4 lb

## 2021-07-17 DIAGNOSIS — R030 Elevated blood-pressure reading, without diagnosis of hypertension: Secondary | ICD-10-CM | POA: Diagnosis not present

## 2021-07-17 DIAGNOSIS — Z23 Encounter for immunization: Secondary | ICD-10-CM | POA: Diagnosis not present

## 2021-07-17 DIAGNOSIS — Z0000 Encounter for general adult medical examination without abnormal findings: Secondary | ICD-10-CM

## 2021-07-17 DIAGNOSIS — N898 Other specified noninflammatory disorders of vagina: Secondary | ICD-10-CM | POA: Diagnosis not present

## 2021-07-17 DIAGNOSIS — R21 Rash and other nonspecific skin eruption: Secondary | ICD-10-CM

## 2021-07-17 DIAGNOSIS — F411 Generalized anxiety disorder: Secondary | ICD-10-CM

## 2021-07-17 HISTORY — DX: Elevated blood-pressure reading, without diagnosis of hypertension: R03.0

## 2021-07-17 MED ORDER — ALPRAZOLAM 0.25 MG PO TABS: 0.2500 mg | ORAL_TABLET | Freq: Every evening | ORAL | 0 refills | Status: DC | PRN

## 2021-07-17 NOTE — Assessment & Plan Note (Signed)
[name] noticed a macular rash on her chest and under bilateral breast that has been progressively spreading over the past several weeks.  She notes that it burns but it is not pruritic at this time.  She denies any recent changes in detergents, soaps, clothing.  On examination, patient noted to have macular rash that is extensive across her chest and under bilateral breasts.  This does not appear to be consistent with Candida infection or acute allergic reaction at this time.  May be side effect from her recent antibiotics.  Plan Patient advised to continue monitoring for any progression Advised for symptomatic treatment with topical steroids if develops urticaria If persistent, would benefit from obtaining CBC to check for eosinophilia

## 2021-07-17 NOTE — Assessment & Plan Note (Signed)
Flu vaccine administered during this visit

## 2021-07-17 NOTE — Patient Instructions (Addendum)
Ms Donna Davis,  It was a pleasure seeing you in clinic. Today we discussed:   Vaginal itching/discharge:  We obtained a vaginal sample during this visit. I will message you with any abnormal results  Rash:  Please continue to monitor for any progression. You may use topical steroids such as hydrocortisone as needed if this starts to itch  Xanax 0.25mg  nightly as needed refilled   If you have any questions or concerns, please call our clinic at [phone] between 9am-5pm and after hours call [phone] and ask for the internal medicine resident on call. If you feel you are having a medical emergency please call 911.   Thank you, we look forward to helping you remain healthy!

## 2021-07-17 NOTE — Assessment & Plan Note (Signed)
Patient noted to have elevated blood pressures at past several visits.  No history of hypertension.  Patient is asymptomatic at this time.  Plan Continue to monitor BP at future visits If blood pressure remains persistently, will initiate antihypertensives therapy

## 2021-07-17 NOTE — Assessment & Plan Note (Signed)
Patient is currently on BuSpar 20 mg 3 times daily and Xanax 0.25 mg at bedtime as needed.  She is doing well on this.  She is requesting a refill on her Xanax be sent to [facility] and so [facility] as they are currently out of it. PDMP reviewed and appropriate  Plan Xanax 0.25 mg daily as needed sent to [facility]

## 2021-07-17 NOTE — Assessment & Plan Note (Signed)
[name] is presenting for evaluation of vaginal irritation.  She was initially prescribed Diflucan for concerns of yeast infection in setting of cefdinir use for possible cochlear implant infection.  Patient reports taking both doses of the Diflucan but continues to have vaginal irritation.  Denies any discharge. Vaginal exam without any significant abnormal discharge noted.  External genitalia without acute lesions.  Plan Cervicovaginal ancillary testing performed at this visit

## 2021-07-17 NOTE — Progress Notes (Signed)
   CC: vaginal itching, rash on chest  HPI:  [name] is a 50 y.o. female with a past medical history as stated below presenting for evaluation of vaginal itching and nonpruritic rash on her chest.  Please see problem based charting for complete assessment and plan  Past Medical History:  Diagnosis Date   Anxiety    Clostridium difficile infection    x several times   Deaf    uses sign language   Endometriosis    s/p TAH, LSO   Family history of malignant neoplasm of gastrointestinal tract    Gallstones    GERD (gastroesophageal reflux disease)    Hearing difficulty    b/l, 2/2 congenital rubella s/p stapedectomy. Using hearing aid   Hepatic cyst    6 mm on CT done done in 05/2005   Hypothyroidism    IBS (irritable bowel syndrome)    Migraines    "frequency depends on my stress level" (07/23/2017)   Obesity    PONV (postoperative nausea and vomiting)    "sometimes; depends on how long I'm under"   Shingles 03/2013   Review of Systems: Negative except as stated in HPI  Physical Exam:  Vitals:   07/17/21 1317 07/17/21 1407  BP: (!) 150/87 (!) 147/91  Pulse: 91 87  Temp: 99.1 F (37.3 C)   TempSrc: Oral   SpO2: 95%   Weight: 188 lb 6.4 oz (85.5 kg)   Height: 5' (1.524 m)    Physical Exam  Constitutional: Appears well-developed and well-nourished. No distress.  HENT: Normocephalic and atraumatic Cardiovascular: Normal rate, regular rhythm, S1 and S2 present, no murmurs, rubs, gallops.  Distal pulses intact Respiratory: Lungs are clear to auscultation bilaterally. Musculoskeletal: Normal bulk and tone.   Neurological: Is alert and oriented x4, no apparent focal deficits noted. Skin: Warm and dry.  Nonpruritic macular rash noted across her chest and under bilateral breast GU: Normal external genitalia, well rugated vaginal walls, no abnormal discharge noted, no cervix noted on exam. Psychiatric: Normal mood and affect.   Assessment & Plan:   See Encounters  Tab for problem based charting.  Patient discussed with Dr. Evette Doffing

## 2021-07-18 LAB — CERVICOVAGINAL ANCILLARY ONLY
Bacterial Vaginitis (gardnerella): NEGATIVE
Candida Glabrata: NEGATIVE
Candida Vaginitis: NEGATIVE
Chlamydia: NEGATIVE
Comment: NEGATIVE
Comment: NEGATIVE
Comment: NEGATIVE
Comment: NEGATIVE
Comment: NEGATIVE
Comment: NORMAL
Neisseria Gonorrhea: NEGATIVE
Trichomonas: NEGATIVE

## 2021-07-18 NOTE — Progress Notes (Signed)
Internal Medicine Clinic Attending ° °Case discussed with Dr. Aslam  At the time of the visit.  We reviewed the resident’s history and exam and pertinent patient test results.  I agree with the assessment, diagnosis, and plan of care documented in the resident’s note.  °

## 2021-08-07 ENCOUNTER — Encounter: Payer: Self-pay | Admitting: Internal Medicine

## 2021-08-14 NOTE — Telephone Encounter (Unsigned)
Call to check on CTscan results requested.  Patient stated has not heard frm [facility] as yet.  Will send massage though E Chart when she finds oit what id being done. Sander Nephew, RN  08/14/2021 2:15 PM

## 2021-08-16 ENCOUNTER — Encounter: Payer: Self-pay | Admitting: Internal Medicine

## 2021-08-16 ENCOUNTER — Other Ambulatory Visit: Payer: Self-pay | Admitting: Internal Medicine

## 2021-08-16 DIAGNOSIS — K921 Melena: Secondary | ICD-10-CM

## 2021-08-17 MED ORDER — TRAMADOL HCL 50 MG PO TABS: 50.0000 mg | ORAL_TABLET | Freq: Four times a day (QID) | ORAL | 0 refills | Status: DC | PRN

## 2021-08-21 ENCOUNTER — Telehealth: Payer: Self-pay | Admitting: *Deleted

## 2021-08-21 NOTE — Telephone Encounter (Signed)
Error

## 2021-08-22 ENCOUNTER — Other Ambulatory Visit: Payer: Self-pay | Admitting: Internal Medicine

## 2021-08-22 ENCOUNTER — Telehealth: Payer: Self-pay

## 2021-08-22 DIAGNOSIS — N951 Menopausal and female climacteric states: Secondary | ICD-10-CM

## 2021-08-22 DIAGNOSIS — K219 Gastro-esophageal reflux disease without esophagitis: Secondary | ICD-10-CM

## 2021-08-22 NOTE — Telephone Encounter (Signed)
PA  for pt ( TRAMADOL HCL 50 MG TAB ) was sent through on cover my meds was done and sent back .. awaiting approval or denial ..   DECISION :   Approved today  PA Case: [id number],  Status: Approved, Coverage Starts on: 08/22/2021 12:00:00 AM, Coverage Ends on: 02/18/2022 12:00:00 AM.   ( COPY SENT TO PHARMACY ALSO )

## 2021-09-05 ENCOUNTER — Other Ambulatory Visit: Payer: Self-pay | Admitting: Internal Medicine

## 2021-09-05 ENCOUNTER — Encounter: Payer: Self-pay | Admitting: Internal Medicine

## 2021-09-05 DIAGNOSIS — N951 Menopausal and female climacteric states: Secondary | ICD-10-CM

## 2021-09-05 DIAGNOSIS — E039 Hypothyroidism, unspecified: Secondary | ICD-10-CM

## 2021-09-05 DIAGNOSIS — R197 Diarrhea, unspecified: Secondary | ICD-10-CM

## 2021-09-06 MED ORDER — ESTRADIOL 1 MG PO TABS: 1.0000 mg | ORAL_TABLET | Freq: Every day | ORAL | 3 refills | Status: DC

## 2021-09-06 MED ORDER — ALPRAZOLAM 0.25 MG PO TABS: 0.2500 mg | ORAL_TABLET | Freq: Every evening | ORAL | 0 refills | Status: DC | PRN

## 2021-09-06 MED ORDER — PROMETHAZINE HCL 12.5 MG PO TABS: 12.5000 mg | ORAL_TABLET | Freq: Four times a day (QID) | ORAL | 0 refills | Status: DC | PRN

## 2021-09-06 MED ORDER — LEVOTHYROXINE SODIUM 25 MCG PO TABS: 25.0000 ug | ORAL_TABLET | Freq: Every day | ORAL | 3 refills | Status: DC

## 2021-09-19 ENCOUNTER — Other Ambulatory Visit: Payer: Self-pay | Admitting: Internal Medicine

## 2021-09-19 DIAGNOSIS — K921 Melena: Secondary | ICD-10-CM

## 2021-09-21 ENCOUNTER — Other Ambulatory Visit (HOSPITAL_COMMUNITY): Payer: Self-pay

## 2021-09-21 MED ORDER — TRAMADOL HCL 50 MG PO TABS: 50.0000 mg | ORAL_TABLET | Freq: Four times a day (QID) | ORAL | 0 refills | Status: DC | PRN

## 2021-09-21 NOTE — Progress Notes (Signed)
Initiated a prior auth for patients medication Buspirone through CoverMyMeds.  Key: [id number]

## 2021-09-22 ENCOUNTER — Other Ambulatory Visit (HOSPITAL_COMMUNITY): Payer: Self-pay

## 2021-09-22 NOTE — Progress Notes (Signed)
Prior Auth approved by [employer] for patients medication Buspirone 10mg .  Approved 09/21/21-09/21/22  Key:  [id number]

## 2021-09-25 ENCOUNTER — Telehealth: Payer: Self-pay

## 2021-09-25 NOTE — Telephone Encounter (Signed)
DECISION :   The medication you or your doctor asked [place] to review is approved .     ( COPY PLACED  TO BE SCANNED INTO CHART )

## 2021-10-10 ENCOUNTER — Other Ambulatory Visit: Payer: Self-pay | Admitting: Internal Medicine

## 2021-10-13 ENCOUNTER — Other Ambulatory Visit: Payer: Self-pay | Admitting: Internal Medicine

## 2021-10-13 MED ORDER — ALPRAZOLAM 0.25 MG PO TABS: 0.2500 mg | ORAL_TABLET | Freq: Every evening | ORAL | 0 refills | Status: DC | PRN

## 2021-10-19 ENCOUNTER — Ambulatory Visit (HOSPITAL_COMMUNITY)
Admission: RE | Admit: 2021-10-19 | Discharge: 2021-10-19 | Disposition: A | Discharge status: Home / Self Care | Payer: BC Managed Care – PPO | Source: Ambulatory Visit | Attending: Internal Medicine | Admitting: Internal Medicine

## 2021-10-19 ENCOUNTER — Ambulatory Visit (INDEPENDENT_AMBULATORY_CARE_PROVIDER_SITE_OTHER): Payer: BC Managed Care – PPO | Admitting: Internal Medicine

## 2021-10-19 ENCOUNTER — Other Ambulatory Visit: Payer: Self-pay | Admitting: Internal Medicine

## 2021-10-19 ENCOUNTER — Other Ambulatory Visit: Payer: Self-pay

## 2021-10-19 ENCOUNTER — Other Ambulatory Visit (HOSPITAL_COMMUNITY): Payer: Self-pay | Admitting: Internal Medicine

## 2021-10-19 VITALS — BP 134/77 | HR 77 | Temp 98.8°F | Ht 64.0 in | Wt 188.6 lb

## 2021-10-19 DIAGNOSIS — R52 Pain, unspecified: Secondary | ICD-10-CM

## 2021-10-19 DIAGNOSIS — R350 Frequency of micturition: Secondary | ICD-10-CM

## 2021-10-19 DIAGNOSIS — M25551 Pain in right hip: Secondary | ICD-10-CM | POA: Diagnosis not present

## 2021-10-19 DIAGNOSIS — M545 Low back pain, unspecified: Secondary | ICD-10-CM | POA: Diagnosis not present

## 2021-10-19 DIAGNOSIS — M25552 Pain in left hip: Secondary | ICD-10-CM | POA: Diagnosis not present

## 2021-10-19 LAB — POCT URINALYSIS DIPSTICK
Bilirubin, UA: NEGATIVE
Blood, UA: NEGATIVE
Glucose, UA: NEGATIVE
Ketones, UA: NEGATIVE
Leukocytes, UA: NEGATIVE
Nitrite, UA: NEGATIVE
Protein, UA: NEGATIVE
Spec Grav, UA: <=1.005 — AB (ref 1.010–1.025)
Urobilinogen, UA: 0.2 E.U./dL
pH, UA: 5.5 (ref 5.0–8.0)

## 2021-10-19 MED ORDER — OXYCODONE HCL 5 MG PO TABS: 5.0000 mg | ORAL_TABLET | Freq: Three times a day (TID) | ORAL | 0 refills | Status: DC | PRN

## 2021-10-19 NOTE — Assessment & Plan Note (Addendum)
Patient presents today with acute onset right hip and back pain.  This is her first evaluation for her hip pain.  She states that last Monday morning, she was walking her dogs when of her dogs walked in front of her, and she stepped over the its leash and then fell onto her right hip.  She has had a throbbing pain and pins and needle sensation ever since.  It radiates from her right lower back/buttock region, to the anterior aspect of her right thigh, and down her right leg.  The pain is present with both standing and sitting.  Also endorses "dragging her leg" while walking.  Endorses a sensation of fullness in the vaginal and rectal area.  She states that it feels like her "vaginal wall and rectum are asleep." She has tried Advil, Tylenol, and tramadol, however the pain is still present when taking these medications.  Today, she has also noticed increased urinary frequency.  She urinates every 30-45 minutes today.  Exam is notable for tenderness to palpation as well as decreased sensation of the right lower back area.  Sensation is decreased to the lateral and anterior aspects of the thigh on the right side.  Decreased sensation throughout the right lower leg.  Patient is able to flex and extend her leg but has significant pain while doing so.  Able to dorsiflex and plantarflex though limited due to pain.  Overall, exam is limited due to significant pain.  Plan: We will obtain stat lumbar spine and hip films.  Will reassess the patient after her films are obtained and read.  Also obtained urinalysis today given symptoms of abdominal pain/fullness and increased urinary frequency.  Addendum: Hip and lumbar spine x-rays are negative for fractures.  I discussed these results with the patient.  Will provide short course of oxycodone to see if this provides any pain relief.  She was advised not to take this with tramadol, but she can take this with Tylenol.  Patient was advised to contact our office for follow-up  on Monday if her symptoms do not improve.  I also ordered an MRI L-spine for further evaluation, however we will see if this is covered by her insurance.

## 2021-10-19 NOTE — Addendum Note (Signed)
Addended by: Orvis Brill on: 10/19/2021 04:47 PM   Modules accepted: Orders

## 2021-10-19 NOTE — Progress Notes (Signed)
° °  CC: hip/leg pain after a fall  HPI:  [name] is a 51 y.o. with past medical history as noted below who presents to the clinic today for hip/leg pain after a fall. Please see problem-based list for further details, assessments, and plans.  Past Medical History:  Diagnosis Date   Anxiety    Clostridium difficile infection    x several times   Deaf    uses sign language   Endometriosis    s/p TAH, LSO   Family history of malignant neoplasm of gastrointestinal tract    Gallstones    GERD (gastroesophageal reflux disease)    Hearing difficulty    b/l, 2/2 congenital rubella s/p stapedectomy. Using hearing aid   Hepatic cyst    6 mm on CT done done in 05/2005   Hypothyroidism    IBS (irritable bowel syndrome)    Migraines    "frequency depends on my stress level" (07/23/2017)   Obesity    PONV (postoperative nausea and vomiting)    "sometimes; depends on how long I'm under"   Shingles 03/2013   Review of Systems:  Negative aside from that listed in individualized problem based charting.  Physical Exam:  Vitals:   10/19/21 1342  BP: 134/77  Pulse: 77  Temp: 98.8 F (37.1 C)  TempSrc: Oral  SpO2: 100%  Weight: 188 lb 9.6 oz (85.5 kg)  Height: 5\' 4"  (1.626 m)   General: NAD, nl appearance HE: Normocephalic, atraumatic, EOMI, Conjunctivae normal ENT: No congestion, no rhinorrhea, no exudate or erythema  Cardiovascular: Normal rate, regular rhythm. No murmurs, rubs, or gallops Pulmonary: Effort normal, breath sounds normal. No wheezes, rales, or rhonchi Abdominal: soft, nontender, bowel sounds present Musculoskeletal: Tenderness to palpation of the right lower back Skin: Warm, dry, no bruising, erythema, or rash Psychiatric/Behavioral: normal mood, normal behavior    Neuro:  Decreased sensation of right lower back area.  Sensation is decreased to the lateral and anterior aspects of the thigh on the right side.  Decreased sensation throughout the right lower leg.   Patient is able to flex and extend her leg but has significant pain while doing so.  Able to dorsiflex and plantarflex though limited exam due to pain.  Overall exam is limited d/t pain.   Assessment & Plan:   See Encounters Tab for problem based charting.  Patient discussed with Dr. Daryll Drown

## 2021-10-19 NOTE — Patient Instructions (Signed)
Thank you, [name] for allowing [place] to provide your care today. Today we discussed your back pain.  Your hip and back films were negative for fracture.  I have prescribed you some oxycodone to last you through the weekend.  If you are having pain by Monday, contact our office for a follow-up appointment.  Please do not take tramadol with this oxycodone.  You may take Tylenol with the oxycodone, however.  I have ordered an MRI for your back.  Hopefully this will be covered by your insurance.  I have ordered the following labs for you:  Lab Orders  No laboratory test(s) ordered today     Referrals ordered today:   Referral Orders  No referral(s) requested today     I have ordered the following medication/changed the following medications:   Stop the following medications: Medications Discontinued During This Encounter  Medication Reason   traMADol (ULTRAM) 50 MG tablet Change in therapy     Start the following medications: Meds ordered this encounter  Medications   oxyCODONE (OXY IR/ROXICODONE) 5 MG immediate release tablet    Sig: Take 1 tablet (5 mg total) by mouth 3 (three) times daily as needed for severe pain or moderate pain.    Dispense:  11 tablet    Refill:  0     Follow up: If symptoms persist by Monday, please contact our office for an appointment.   Should you have any questions or concerns please call the internal medicine clinic at [phone].

## 2021-10-20 NOTE — Progress Notes (Signed)
Internal Medicine Clinic Attending  Case discussed with Dr. Bonanno  at the time of the visit.  We reviewed the resident's history and exam and pertinent patient test results.  I agree with the assessment, diagnosis, and plan of care documented in the resident's note.  

## 2021-10-22 ENCOUNTER — Emergency Department (HOSPITAL_COMMUNITY)
Admission: EM | Admit: 2021-10-22 | Discharge: 2021-10-22 | Disposition: A | Discharge status: Home / Self Care | Payer: BC Managed Care – PPO | Attending: Emergency Medicine | Admitting: Emergency Medicine

## 2021-10-22 ENCOUNTER — Emergency Department (HOSPITAL_COMMUNITY): Payer: BC Managed Care – PPO

## 2021-10-22 DIAGNOSIS — E039 Hypothyroidism, unspecified: Secondary | ICD-10-CM | POA: Insufficient documentation

## 2021-10-22 DIAGNOSIS — Z79899 Other long term (current) drug therapy: Secondary | ICD-10-CM | POA: Diagnosis not present

## 2021-10-22 DIAGNOSIS — M25551 Pain in right hip: Secondary | ICD-10-CM | POA: Insufficient documentation

## 2021-10-22 DIAGNOSIS — M545 Low back pain, unspecified: Secondary | ICD-10-CM | POA: Diagnosis not present

## 2021-10-22 DIAGNOSIS — Z20822 Contact with and (suspected) exposure to covid-19: Secondary | ICD-10-CM | POA: Diagnosis not present

## 2021-10-22 DIAGNOSIS — S3993XA Unspecified injury of pelvis, initial encounter: Secondary | ICD-10-CM | POA: Diagnosis not present

## 2021-10-22 DIAGNOSIS — D72829 Elevated white blood cell count, unspecified: Secondary | ICD-10-CM | POA: Insufficient documentation

## 2021-10-22 DIAGNOSIS — N3289 Other specified disorders of bladder: Secondary | ICD-10-CM | POA: Diagnosis not present

## 2021-10-22 LAB — BASIC METABOLIC PANEL
Anion gap: 9 (ref 5–15)
BUN: 8 mg/dL (ref 6–20)
CO2: 25 mmol/L (ref 22–32)
Calcium: 9.5 mg/dL (ref 8.9–10.3)
Chloride: 106 mmol/L (ref 98–111)
Creatinine, Ser: 0.75 mg/dL (ref 0.44–1.00)
GFR, Estimated: >60 mL/min (ref >60)
Glucose, Bld: 87 mg/dL (ref 70–99)
Potassium: 4.6 mmol/L (ref 3.5–5.1)
Sodium: 140 mmol/L (ref 135–145)

## 2021-10-22 LAB — CBC
HCT: 43.4 % (ref 36.0–46.0)
Hemoglobin: 14 g/dL (ref 12.0–15.0)
MCH: 30.3 pg (ref 26.0–34.0)
MCHC: 32.3 g/dL (ref 30.0–36.0)
MCV: 93.9 fL (ref 80.0–100.0)
Platelets: 275 10*3/uL (ref 150–400)
RBC: 4.62 MIL/uL (ref 3.87–5.11)
RDW: 11.8 % (ref 11.5–15.5)
WBC: 9 10*3/uL (ref 4.0–10.5)
nRBC: 0 % (ref 0.0–0.2)

## 2021-10-22 LAB — URINALYSIS, ROUTINE W REFLEX MICROSCOPIC
Bilirubin Urine: NEGATIVE
Glucose, UA: NEGATIVE mg/dL
Hgb urine dipstick: NEGATIVE
Ketones, ur: NEGATIVE mg/dL
Leukocytes,Ua: NEGATIVE
Nitrite: NEGATIVE
Protein, ur: NEGATIVE mg/dL
Specific Gravity, Urine: 1.01 (ref 1.005–1.030)
pH: 6 (ref 5.0–8.0)

## 2021-10-22 LAB — RESP PANEL BY RT-PCR (FLU A&B, COVID) ARPGX2
Influenza A by PCR: NEGATIVE
Influenza B by PCR: NEGATIVE
SARS Coronavirus 2 by RT PCR: NEGATIVE

## 2021-10-22 MED ORDER — DEXAMETHASONE SODIUM PHOSPHATE 10 MG/ML IJ SOLN
10.0000 mg | Freq: Once | INTRAMUSCULAR | Status: AC
  Administered 2021-10-22: 10 mg via INTRAVENOUS
  Filled 2021-10-22: qty 1

## 2021-10-22 MED ORDER — PREDNISONE 10 MG (21) PO TBPK: ORAL_TABLET | ORAL | 0 refills | Status: DC

## 2021-10-22 MED ORDER — LORAZEPAM 2 MG/ML IJ SOLN
1.0000 mg | Freq: Once | INTRAMUSCULAR | Status: AC
  Administered 2021-10-22: 1 mg via INTRAVENOUS
  Filled 2021-10-22: qty 1

## 2021-10-22 MED ORDER — LORAZEPAM 2 MG/ML IJ SOLN
0.5000 mg | Freq: Once | INTRAMUSCULAR | Status: AC
Start: 2021-10-22 — End: 2021-10-22
  Administered 2021-10-22: 0.5 mg via INTRAVENOUS
  Filled 2021-10-22: qty 1

## 2021-10-22 MED ORDER — FENTANYL CITRATE PF 50 MCG/ML IJ SOSY
50.0000 ug | PREFILLED_SYRINGE | Freq: Once | INTRAMUSCULAR | Status: AC
  Administered 2021-10-22: 50 ug via INTRAVENOUS
  Filled 2021-10-22: qty 1

## 2021-10-22 NOTE — Discharge Instructions (Addendum)
You were evaluated in the Emergency Department and after careful evaluation, we did not find any emergent condition requiring admission or further testing in the hospital.  Your imaging today showed some questionable findings which may be consistent w/ a right femur fracture however further recommended MRI is recommended. If you continue to have pain please follow up with your primary care doctor for an outpatient MRI.  Your back MRI did show a bulging disc at L3-4 but no evidence of life-threatening spinal cord compression as per discussion with neurosurgery.  Please continue to take the prednisone.  As discussed, we will avoid gabapentin.  Please continue to take your pain medication.  Please follow-up with Dr. Annette Stable with neurosurgery whose contact information I have provided in your discharge paperwork.  Please return to the Emergency Department if you experience any worsening of your condition.  We encourage you to follow up with a primary care provider tomorrow.  Thank you for allowing [place] to be a part of your care.

## 2021-10-22 NOTE — ED Triage Notes (Signed)
Pt. Stated, I fell last week on my right hip and my whole leg was really hurting. Then I started peeing a lot, I mean every 30 - 49 min. Then my back started hurting. Now its like Im not emptying my bladder.

## 2021-10-22 NOTE — ED Provider Notes (Signed)
[facility] EMERGENCY DEPARTMENT Provider Note   CSN: [id number] Arrival date & time: 10/22/21  0233     History  Chief Complaint  Patient presents with   Urinary Tract Infection   Flank Pain   Urinary Retention    Donna Davis is a 51 y.o. female.  HPI 51 year old female with a history of hypothyroidism, GERD, anxiety, cochlear implant, bilateral deafness, endometriosis, IBS presents to the ER with complaints of difficulty urinating. Offered translator services however patient preferred her partner at bedside to translate. Patient states that she had a fall outdoors on 2/1, states that she landed on her right foot.  Since then she has been having severe back pain, decreased sensation to upper and lower leg.  She has been able to ambulate though with significant pain.  She was given oxycodone for pain, and states that this morning she noticed she was having difficulty urinating.  She also has had only 1 very small bowel movement last night. She has been taking her prescribed oxycodone as scheduled. She rates her pain a 10/10. Denies any fevers, chills, dysuria, nausea, vomiting, IV drug use    Home Medications Prior to Admission medications   Medication Sig Start Date End Date Taking? Authorizing Provider  ALPRAZolam (XANAX) 0.25 MG tablet Take 1 tablet (0.25 mg total) by mouth at bedtime as needed for anxiety. 10/13/21   Sid Falcon, MD  Ascorbic Acid (VITAMIN C PO) Take 1 tablet by mouth daily.    [provider]  betamethasone valerate (VALISONE) 0.1 % cream APPLY 1 APPLICATION ON THE SKIN TWICE DAILY AS NEEDED 03/31/18   [provider]  busPIRone (BUSPAR) 10 MG tablet TAKE 2 TABLETS BY MOUTH THREE TIMES DAILY 08/24/21   Sid Falcon, MD  Calcium Carbonate (CALCIUM 600 PO) Take 1 tablet by mouth daily.    [provider]  estradiol (ESTRACE) 1 MG tablet Take 1 tablet (1 mg total) by mouth daily. 09/06/21   Sid Falcon, MD   fluconazole (DIFLUCAN) 150 MG tablet Take by mouth once and then again in 72 hours if no improvement in symptoms. 07/06/21   Sid Falcon, MD  guaiFENesin-codeine (CHERATUSSIN AC) 100-10 MG/5ML syrup Take 5 mLs by mouth 3 (three) times daily as needed for up to 8 days for cough. 05/17/21 05/25/21  Rick Duff, MD  Javier Docker Oil (OMEGA-3) 500 MG CAPS Take 1 capsule by mouth daily.    [provider]  levothyroxine (EUTHYROX) 25 MCG tablet Take 1 tablet (25 mcg total) by mouth daily. 09/06/21   Sid Falcon, MD  meclizine (ANTIVERT) 50 MG tablet Take 1 tablet (50 mg total) by mouth 3 (three) times daily as needed. 06/30/21   Sid Falcon, MD  methocarbamol (ROBAXIN) 500 MG tablet Take 1,000 mg by mouth 4 (four) times daily. 06/19/21   [provider]  omeprazole (PRILOSEC) 20 MG capsule TAKE 1 CAPSULE BY MOUTH TWICE DAILY BEFORE A MEAL OR FOOD. 08/24/21   Sid Falcon, MD  oxyCODONE (OXY IR/ROXICODONE) 5 MG immediate release tablet Take 1 tablet (5 mg total) by mouth 3 (three) times daily as needed for severe pain or moderate pain. 10/19/21   Orvis Brill, MD  predniSONE (STERAPRED UNI-PAK 21 TAB) 10 MG (21) TBPK tablet Take as directed until finished 10/22/21  Yes Cyril Railey, Pamelia Hoit, PA-C  promethazine (PHENERGAN) 12.5 MG tablet Take 1 tablet (12.5 mg total) by mouth every 6 (six) hours as needed for nausea  or vomiting. 09/06/21   Sid Falcon, MD  VITAMIN D PO One daily    [provider]      Allergies    Onion, Other, Shellfish allergy, Amitriptyline, Amoxicillin-pot clavulanate, Amoxicillin-pot clavulanate, Cefuroxime, Cephalexin, Ciprofloxacin, Clarithromycin, Clarithromycin, Doxycycline, Propranolol, Sulfonamide derivatives, Sulfa antibiotics, Sulfasalazine, Benadryl [diphenhydramine hcl (sleep)], and Shrimp flavor    Review of Systems   Review of Systems Ten systems reviewed and are negative for acute change, except as noted in the HPI.   Physical  Exam Updated Vital Signs BP (!) 150/90    Pulse 85    Temp 98.4 F (36.9 C) (Oral)    Resp 17    LMP 12/26/2012    SpO2 98%  Physical Exam Vitals and nursing note reviewed.  Constitutional:      General: She is not in acute distress.    Appearance: She is well-developed.  HENT:     Head: Normocephalic and atraumatic.  Eyes:     Conjunctiva/sclera: Conjunctivae normal.  Cardiovascular:     Rate and Rhythm: Normal rate and regular rhythm.     Heart sounds: No murmur heard. Pulmonary:     Effort: Pulmonary effort is normal. No respiratory distress.     Breath sounds: Normal breath sounds.  Abdominal:     Palpations: Abdomen is soft.     Tenderness: There is no abdominal tenderness.  Genitourinary:    Comments: Chaperone present at bedside. Good rectal tone and no perianal numbness  Musculoskeletal:        General: No swelling.     Cervical back: Neck supple.     Comments: Significant midline lumbar tenderness.  Patient Dors is decree sensation to right lateral and anterior upper and lower legs.  4/5 strength in right lower extremity, 5/5 in left.  No visible leg shortening, internal or external rotation.  No visible broken skin or deformities to the right glued.  No crepitus.  Skin:    General: Skin is warm and dry.     Capillary Refill: Capillary refill takes less than 2 seconds.  Neurological:     Mental Status: She is alert.  Psychiatric:        Mood and Affect: Mood normal.    ED Results / Procedures / Treatments   Labs (all labs ordered are listed, but only abnormal results are displayed) Labs Reviewed  RESP PANEL BY RT-PCR (FLU A&B, COVID) ARPGX2  CBC  BASIC METABOLIC PANEL  URINALYSIS, ROUTINE W REFLEX MICROSCOPIC    EKG None  Radiology CT PELVIS WO CONTRAST  Result Date: 10/22/2021 CLINICAL DATA:  Trauma, pain EXAM: CT PELVIS WITHOUT CONTRAST TECHNIQUE: Multidetector CT imaging of the pelvis was performed following the standard protocol without intravenous  contrast. RADIATION DOSE REDUCTION: This exam was performed according to the departmental dose-optimization program which includes automated exposure control, adjustment of the mA and/or kV according to patient size and/or use of iterative reconstruction technique. COMPARISON:  None. FINDINGS: There is no free fluid in the pelvic cavity. Uterus is not seen. Urinary bladder is not distended. There is mild diffuse wall thickening in the urinary bladder. Visualized bowel loops are unremarkable. There are subcentimeter mesenteric lymph nodes. No displaced fracture or dislocation is seen in the bony structures. In image 84 of series 3, there is small radiolucency in the anterior cortical margin of neck of right femur. In the sagittal reconstruction image number 57, there is faint linear lucency is seen in the anterior cortical margin of the neck of the  right femur. There is no demonstrable effusion in the hip joints or any large hematoma adjacent to the proximal femurs. There is 2 mm sclerotic density in right iliac bone in image 28, possibly a benign bone island. IMPRESSION: No displaced fracture or dislocation is seen. There is small radiolucency in the anterior cortical margin of neck of right femur without definite disruption of trabeculae. This may be normal variation such as nutrient foramen or undisplaced fracture. If there are continued symptoms in the right hip, follow-up MRI may be considered. Electronically Signed   By: Elmer Picker M.D.   On: 10/22/2021 11:44   MR LUMBAR SPINE WO CONTRAST  Result Date: 10/22/2021 CLINICAL DATA:  Low back pain, status post trauma EXAM: MRI LUMBAR SPINE WITHOUT CONTRAST TECHNIQUE: Multiplanar, multisequence MR imaging of the lumbar spine was performed. No intravenous contrast was administered. COMPARISON:  05/09/2018 FINDINGS: Segmentation:  Standard. Alignment: Grade 1 anterolisthesis of L2 on L3 and L3 on L4 secondary to facet disease. Vertebrae: No acute fracture,  evidence of discitis, or aggressive bone lesion. Conus medullaris and cauda equina: Conus extends to the T12-L1 level. Conus and cauda equina appear normal. Paraspinal and other soft tissues: No acute paraspinal abnormality. Disc levels: Disc spaces: Disc desiccation at T12-L1, L1-2, L2-3 and L3-4 with mild disc height loss at L1-2 and L3-4. Degenerative disease with disc height loss at T10-11. T12-L1: No significant disc bulge. No neural foraminal stenosis. No central canal stenosis. Mild bilateral facet arthropathy. L1-L2: Mild broad-based disc bulge. Mild bilateral facet arthropathy. No foraminal or central canal stenosis. L2-L3: Mild broad-based disc bulge. Moderate bilateral facet arthropathy. No foraminal or central canal stenosis. L3-L4: Broad-based disc bulge flattening the ventral thecal sac. Moderate bilateral facet arthropathy, right worse than left with a right facet effusion. Moderate-severe spinal stenosis. Right subarticular recess stenosis. Mild bilateral foraminal stenosis. L4-L5: Minimal broad-based disc bulge. No foraminal or central canal stenosis. L5-S1: No significant disc bulge. No neural foraminal stenosis. No central canal stenosis. IMPRESSION: 1.  No acute osseous injury of the lumbar spine. 2. At L3-4 there is a broad-based disc bulge flattening the ventral thecal sac. Moderate bilateral facet arthropathy, right worse than left with a right facet effusion. Moderate-severe spinal stenosis. Right subarticular recess stenosis. Mild bilateral foraminal stenosis. Electronically Signed   By: Kathreen Devoid M.D.   On: 10/22/2021 12:22    Procedures Procedures    Medications Ordered in ED Medications  LORazepam (ATIVAN) injection 0.5 mg (0.5 mg Intravenous Given 10/22/21 1110)  LORazepam (ATIVAN) injection 1 mg (1 mg Intravenous Given 10/22/21 1201)  dexamethasone (DECADRON) injection 10 mg (10 mg Intravenous Given 10/22/21 1434)  fentaNYL (SUBLIMAZE) injection 50 mcg (50 mcg Intravenous Given  10/22/21 1432)    ED Course/ Medical Decision Making/ A&P                           Medical Decision Making 51 year old female who presents to the ER after having a fall on/1.  On arrival, she is uncomfortable appearing, however no acute distress, resting comfortably in the ER bed.  Vitals overall reassuring.  Physical exam with significant midline tenderness to the lumbar spine, decreased range of motion of the right hip, and decreased sensation to the right anterior and lateral upper lower leg.  4/5 strength with dorsi/plantar flexion of the right leg, 5/5 strength in RLE w/ dorsi/plantar flexion. Rectal exam w/ good rectal tone, perineal sensation intact. Bladder scanned pre-void 150mL, post void  86mL   Differential diagnosis includes lumbar strain, cauda equina, pelvic fracture, spinal abscess, lumbar radiculopathy, opioid-induced constipation, UTI, pyelonephritis, urinary retention  Lumbar MRI and pelvic CT scan reviewed by myself and radiology. MRI with L3-4 disc bulge flattening the ventral thecal sac.  CT of the pelvis with small radiolucency in the anterior cortical margin of the neck of the right femur without definitive disruption of the trabecula it may be normal variation such as nutrient foramen or undisplaced fracture  I spoke with neurosurgery NP Reinaldo Meeker who reviewed the patient's imaging and did not feel like this was consistent with cauda equina.  Patient has good rectal tone, no loss of bowel bladder control, bladder scan without evidence of urinary retention.  She recommended a steroid dose here as well as a steroid taper and gabapentin.   I discussed this with the patient who states that she is intolerant to gabapentin.  We will avoid this medication however continue steroid taper. I ordered her 10 mg of Decadron, and 50 mg of fentanyl as her pain started to return.  I will have her follow-up with Dr. Annette Stable in the office.  She and her spouse at bedside are agreeable to this.  She states  that she has enough tramadol and oxycodone and has a follow-up with her PCP again tomorrow which I encouraged her to keep.  We discussed return precautions.  She was understanding and is agreeable.  Stable for discharge.    Amount and/or Complexity of Data Reviewed Independent Historian: parent and spouse External Data Reviewed: radiology and notes. Labs: ordered.    Details: CBC, BMP, UA and COVID test ordered.  CBC evaluate for leukocytosis, BMP for electrode abnormalities, UA to rule out UTI Radiology: ordered and independent interpretation performed.    Details: MRI of the lumbar spine rule out cauda equina, CT pelvis for more urgent evaluation for possible pelvic fracture  MRI with L3-4 disc bulge flattening the ventral thecal sac.  CT of the pelvis with small radiolucency in the anterior cortical margin of the neck of the right femur without definitive disruption of the trabecula it may be normal variation such as nutrient foramen or undisplaced fracture  Risk Prescription drug management.   Patient deferred pain medication at this time.  States that she took her oxycodone about 2 hours ago.        Final Clinical Impression(s) / ED Diagnoses Final diagnoses:  Lumbar back pain  Right hip pain    Rx / DC Orders ED Discharge Orders          Ordered    predniSONE (STERAPRED UNI-PAK 21 TAB) 10 MG (21) TBPK tablet        10/22/21 1423              Garald Balding, PA-C 10/22/21 1440    Tegeler, Gwenyth Allegra, MD 10/22/21 [phone]

## 2021-10-22 NOTE — ED Notes (Signed)
Pt verbalizes understanding of discharge instructions. Opportunity for questions and answers were provided. Pt discharged from the ED.   ?

## 2021-10-23 ENCOUNTER — Encounter: Payer: Self-pay | Admitting: Internal Medicine

## 2021-10-23 ENCOUNTER — Ambulatory Visit (INDEPENDENT_AMBULATORY_CARE_PROVIDER_SITE_OTHER): Payer: BC Managed Care – PPO | Admitting: Internal Medicine

## 2021-10-23 ENCOUNTER — Other Ambulatory Visit: Payer: Self-pay

## 2021-10-23 DIAGNOSIS — M25551 Pain in right hip: Secondary | ICD-10-CM | POA: Diagnosis not present

## 2021-10-23 MED ORDER — TRAMADOL HCL 50 MG PO TABS
50.0000 mg | ORAL_TABLET | Freq: Four times a day (QID) | ORAL | 0 refills | Status: AC | PRN
Start: 2021-10-23 — End: 2021-10-27

## 2021-10-23 NOTE — Assessment & Plan Note (Addendum)
The patient is here for follow-up of her right hip pain.  She was last seen on 2/3, at which time hip and spine films were obtained and found to be negative for fracture.  At that time, patient was given a short course of opioids and was instructed to follow-up as needed if symptoms do not improve by today.  Patient was seen in the ED yesterday due to persistent symptoms despite opioid medication.  At that time, MRI of the L-spine was obtained and showed  L3-4 disc bulge flattening the ventral thecal sac as well as bilateral facet arthropathy and moderate to severe spinal stenosis.  Neurosurgery was consulted and did not feel this was cauda equina syndrome, however she will follow-up with Dr. Trenton Gammon of neurosurgery this coming Thursday.  At discharge, she was given a steroid taper.  Today, the patient states that the pain is the same as it was on Friday.  Oxycodone helped a little bit, however she does not how it makes her feel sleepy.  Patient says tramadol she was given before on a prior visit helped alleviate the pain.  She also says that the steroids she felt yesterday seemed to be helping.  Plan: I discussed our recommendation to continue steroid regimen at this time.  She will be seen by Dr. Trenton Gammon of neurosurgery on Thursday, so we will follow-up on this documentation prior to her next visit in the [facility] clinic this coming Friday.  Given short dose of tramadol today.  Follow-up on 2/10.

## 2021-10-23 NOTE — Progress Notes (Signed)
° °  CC: follow-up right hip pain  HPI:  [name] is a 51 y.o. with past medical history as noted below who presents to the clinic today for follow-up of her right hip pain. Please see problem-based list for further details, assessments, and plans.  Past Medical History:  Diagnosis Date   Anxiety    Clostridium difficile infection    x several times   Deaf    uses sign language   Endometriosis    s/p TAH, LSO   Family history of malignant neoplasm of gastrointestinal tract    Gallstones    GERD (gastroesophageal reflux disease)    Hearing difficulty    b/l, 2/2 congenital rubella s/p stapedectomy. Using hearing aid   Hepatic cyst    6 mm on CT done done in 05/2005   Hypothyroidism    IBS (irritable bowel syndrome)    Migraines    "frequency depends on my stress level" (07/23/2017)   Obesity    PONV (postoperative nausea and vomiting)    "sometimes; depends on how long I'm under"   Shingles 03/2013   Review of Systems: Negative aside from that listed in individualized problem based charting.  Physical Exam:  Vitals:   10/23/21 1322  BP: (!) 143/81  Pulse: 99  Temp: 99.2 F (37.3 C)  TempSrc: Oral  SpO2: 98%  Weight: 188 lb 12.8 oz (85.6 kg)  Height: 5\' 4"  (1.626 m)   General: NAD, nl appearance HE: Normocephalic, atraumatic, EOMI, Conjunctivae normal ENT: No congestion, no rhinorrhea, no exudate or erythema  Cardiovascular: Normal rate, regular rhythm. No murmurs, rubs, or gallops Pulmonary: Effort normal, breath sounds normal. No wheezes, rales, or rhonchi Abdominal: soft, nontender, bowel sounds present Musculoskeletal: Right lower back continues to be tender compared to prior exam. Exam unchanged from prior. Skin: Warm, dry, no bruising, erythema, or rash Psychiatric/Behavioral: normal mood, normal behavior     Assessment & Plan:   See Encounters Tab for problem based charting.  Patient discussed with Dr. Daryll Drown

## 2021-10-23 NOTE — Patient Instructions (Signed)
Thank you, [name] for allowing [place] to provide your care today. Today we discussed your pain.  - Continue the steroids as prescribed by the ED doctor yesterday. I am refilling some of your tramadol, but just remember not to take this with oxycodone.  - We would like to see you Friday to see how you are doing    I have ordered the following labs for you:  Lab Orders  No laboratory test(s) ordered today     Referrals ordered today:   Referral Orders  No referral(s) requested today     I have ordered the following medication/changed the following medications:   Stop the following medications: There are no discontinued medications.   Start the following medications: No orders of the defined types were placed in this encounter.    Follow up: this Friday   Should you have any questions or concerns please call the internal medicine clinic at [phone].

## 2021-10-26 ENCOUNTER — Telehealth: Payer: Self-pay | Admitting: *Deleted

## 2021-10-26 DIAGNOSIS — M5416 Radiculopathy, lumbar region: Secondary | ICD-10-CM | POA: Diagnosis not present

## 2021-10-26 DIAGNOSIS — M4310 Spondylolisthesis, site unspecified: Secondary | ICD-10-CM | POA: Diagnosis not present

## 2021-10-26 NOTE — Telephone Encounter (Signed)
Returned call to patient via [place] [id number]. Left detailed message, at patient request, that it is ok to stop prednisone w/o completing taper. She is asked to call us back to provide an update on her back pain.

## 2021-10-26 NOTE — Progress Notes (Signed)
Internal Medicine Clinic Attending  Case discussed with Dr. Bonanno  at the time of the visit.  We reviewed the resident's history and exam and pertinent patient test results.  I agree with the assessment, diagnosis, and plan of care documented in the resident's note.  

## 2021-10-26 NOTE — Telephone Encounter (Addendum)
Patient called in via [place]. States she is taking the STERAPRED UNI-PAK 21 TAB. States she took 3 tabs this AM as directed but is asking if she can stop today and not take 2 tabs tomorrow and 1 tab the next day. States this med has made her feel "terrible, incredibly anxious, disoriented, confused, and having terrible migraines." Please advise.

## 2021-10-26 NOTE — Telephone Encounter (Signed)
She can stop the medication.  She is on it for severe back pain, concern for disc disease.  However, these are certainly side effects that can happen.    Can you ask her how her pain is doing?

## 2021-10-27 ENCOUNTER — Other Ambulatory Visit: Payer: Self-pay

## 2021-10-27 ENCOUNTER — Ambulatory Visit (INDEPENDENT_AMBULATORY_CARE_PROVIDER_SITE_OTHER): Payer: BC Managed Care – PPO | Admitting: Internal Medicine

## 2021-10-27 ENCOUNTER — Encounter: Payer: Self-pay | Admitting: Internal Medicine

## 2021-10-27 VITALS — BP 141/78 | HR 73 | Temp 98.4°F | Ht 64.0 in | Wt 192.6 lb

## 2021-10-27 DIAGNOSIS — M549 Dorsalgia, unspecified: Secondary | ICD-10-CM | POA: Diagnosis not present

## 2021-10-27 DIAGNOSIS — M25551 Pain in right hip: Secondary | ICD-10-CM | POA: Diagnosis not present

## 2021-10-27 NOTE — Assessment & Plan Note (Addendum)
The patient is here today for fu of her R hip and back pain. She states that the tramadol has helped alleviate her pain somewhat, but otherwise the pain levels have been about the same. She was on a short steroid course but stopped taking this yesterday due to feeling anxious from taking them. She voices concerns about returning to work on Monday, stating that she is unable to sit in a car for four hours to drive back to her work which is an [place].  She states that, for work, she drives often and is not able to tolerate her pain at this point.  She does state, however, that the numbness and tingling have improved.  Besides tramadol, she has tried alternating heat and ice, which helps alleviate the pain.  She states that she saw neurosurgery the other day and that they are not recommending surgery at this time.  Plan: CT pelvis from the ED showed possible fracture of the right femur.  Stated an impression that, if pain persist, could consider right hip MRI.  Placed order for MRI of the right hip, to be performed stat.  Unfortunately, radiology technologist are unable to fit her in to the schedule today, but discussed with the patient that we will obtain this as soon as possible and contact her with the plan afterwards.  I have given her a note for work today with recommendation to refrain from in person work duties until further notice.  Patient will continue tramadol that she has currently.  I have also placed an order for DME for a walker.   Addendum: Attempted to contact patient about MRI, which cannot be performed with her hearing implants. Called home # and able to reach ASL interpreter. No answer, left VM with interpreter.  Addendum: Contacted pt about MRI results which were negative. Discussed clearance back to work tomorrow 02/21. Pt is in agreement with this plan.

## 2021-10-27 NOTE — Patient Instructions (Signed)
Thank you, [name] for allowing [place] to provide your care today. Today we discussed your hip and back pain.    I have ordered an MRI to be performed ASAP. They will contact you to schedule this. I have placed an order for a walker as well.    I have ordered the following labs for you:  Lab Orders  No laboratory test(s) ordered today     Tests ordered today:  MRI right hip  Referrals ordered today:   Referral Orders  No referral(s) requested today     I have ordered the following medication/changed the following medications:   Stop the following medications: There are no discontinued medications.   Start the following medications: No orders of the defined types were placed in this encounter.    Follow-up: TBD - will be in touch about imaging  Should you have any questions or concerns please call the internal medicine clinic at [phone].

## 2021-10-27 NOTE — Progress Notes (Signed)
° °  CC: follow-up right hip and back pain  HPI:  [name] is a 51 y.o. with past medical history as noted below who presents to the clinic today for follow-up R hip and back pain. Please see problem-based list for further details, assessments, and plans.   Past Medical History:  Diagnosis Date   Anxiety    Clostridium difficile infection    x several times   Deaf    uses sign language   Endometriosis    s/p TAH, LSO   Family history of malignant neoplasm of gastrointestinal tract    Gallstones    GERD (gastroesophageal reflux disease)    Hearing difficulty    b/l, 2/2 congenital rubella s/p stapedectomy. Using hearing aid   Hepatic cyst    6 mm on CT done done in 05/2005   Hypothyroidism    IBS (irritable bowel syndrome)    Migraines    "frequency depends on my stress level" (07/23/2017)   Obesity    PONV (postoperative nausea and vomiting)    "sometimes; depends on how long I'm under"   Shingles 03/2013   Review of Systems: Negative aside from that listed in individualized problem based charting.  Physical Exam:  Vitals:   10/27/21 0910  BP: (!) 141/78  Pulse: 73  Temp: 98.4 F (36.9 C)  TempSrc: Oral  SpO2: 100%  Weight: 192 lb 9.6 oz (87.4 kg)  Height: 5\' 4"  (1.626 m)   General: NAD, nl appearance HE: Normocephalic, atraumatic, EOMI, Conjunctivae normal ENT: No congestion, no rhinorrhea, no exudate or erythema  Cardiovascular: Normal rate, regular rhythm. No murmurs, rubs, or gallops Pulmonary: Effort normal, breath sounds normal. No wheezes, rales, or rhonchi Abdominal: soft, nontender, bowel sounds present Musculoskeletal: no swelling, tenderness to palpation of right lower back and mid lower back, TTP to right hip area. Neuro: Sensation is decreased to the right anterior upper and lower leg, though improved from exam from prior. Skin: Warm, dry, no bruising, erythema, or rash Psychiatric/Behavioral: normal mood, normal behavior    Assessment & Plan:    See Encounters Tab for problem based charting.  Patient discussed with Dr. Jimmye Davis

## 2021-10-30 DIAGNOSIS — R531 Weakness: Secondary | ICD-10-CM | POA: Diagnosis not present

## 2021-10-31 ENCOUNTER — Encounter: Payer: Self-pay | Admitting: Internal Medicine

## 2021-10-31 NOTE — Addendum Note (Signed)
Addended by: Orvis Brill on: 10/31/2021 03:26 PM   Modules accepted: Orders

## 2021-11-01 NOTE — Telephone Encounter (Signed)
F/U. Unable to speak with the patient with the relay team.  A detailed My Chart message has been sent to the patient.   Name: Donna Davis, Donna Davis MRN: [id number]  Date: 11/02/2021 Status: Sch  Time: 4:00 PM Length: 60  Visit Type: MR HIP RIGHT WO CONTRAST [[id number]] Copay: $0.00  Provider: Jorje Guild

## 2021-11-01 NOTE — Telephone Encounter (Signed)
FO received call from Radiology Scheduler stating patient cannot have MRI 2/2 cochlear implants. Dr. Lorin Glass has been made aware and will contact patient.

## 2021-11-02 ENCOUNTER — Telehealth: Payer: Self-pay | Admitting: Internal Medicine

## 2021-11-02 ENCOUNTER — Ambulatory Visit (HOSPITAL_COMMUNITY)
Admission: RE | Admit: 2021-11-02 | Discharge: 2021-11-02 | Disposition: A | Discharge status: Home / Self Care | Payer: BC Managed Care – PPO | Source: Ambulatory Visit | Attending: Internal Medicine | Admitting: Internal Medicine

## 2021-11-02 ENCOUNTER — Ambulatory Visit (HOSPITAL_COMMUNITY): Payer: BC Managed Care – PPO

## 2021-11-02 DIAGNOSIS — Z043 Encounter for examination and observation following other accident: Secondary | ICD-10-CM | POA: Diagnosis not present

## 2021-11-02 DIAGNOSIS — M25551 Pain in right hip: Secondary | ICD-10-CM | POA: Diagnosis not present

## 2021-11-02 DIAGNOSIS — G43009 Migraine without aura, not intractable, without status migrainosus: Secondary | ICD-10-CM

## 2021-11-02 MED ORDER — TRAMADOL HCL 50 MG PO TABS: 50.0000 mg | ORAL_TABLET | Freq: Four times a day (QID) | ORAL | 0 refills | Status: DC | PRN

## 2021-11-02 NOTE — Telephone Encounter (Signed)
Refill Request   traMADol (ULTRAM) 50 MG tablet   [id number] - [place], [place] - [address] AT [place]

## 2021-11-02 NOTE — Telephone Encounter (Signed)
Patient called in to [place] via Interpreter. States no one called to tell her about cancelled MRI. Patient states she just had an MRI on 2/5 and there should be no reason she cannot have this MRI done. Call placed to [facility]. Spoke with Radiologist, Dr. Maurine Simmering who states he does not know why this MRI was cancelled as she just had MRI of spine on 2/5 she can have MRI hip today. FO will r/s MRI.

## 2021-11-02 NOTE — Telephone Encounter (Signed)
Refill is appropriate.  Not sure why it fell off, but she recently had an Rx of hydrocodone for worsening pain.  She takes tramadol for Migraines for abortive therapy.  We have tried a lot of other options and this one works.   She gets #30 per month, tramadol 50mg  q 6 hours prn moderate pain.   I am not in the office to fill (new phone!), so Dr. Cain Sieve, could you fill this one also.  Thank you so much!

## 2021-11-03 NOTE — Progress Notes (Signed)
Internal Medicine Clinic Attending ° °Case discussed with Dr. Bonanno  °  At the time of the visit.  We reviewed the resident’s history and exam and pertinent patient test results.  I agree with the assessment, diagnosis, and plan of care documented in the resident’s note. ° °

## 2021-11-08 ENCOUNTER — Other Ambulatory Visit: Payer: Self-pay | Admitting: Internal Medicine

## 2021-11-09 MED ORDER — ALPRAZOLAM 0.25 MG PO TABS: 0.2500 mg | ORAL_TABLET | Freq: Every evening | ORAL | 0 refills | Status: DC | PRN

## 2021-11-28 ENCOUNTER — Encounter: Payer: Self-pay | Admitting: Internal Medicine

## 2021-11-28 DIAGNOSIS — G43009 Migraine without aura, not intractable, without status migrainosus: Secondary | ICD-10-CM

## 2021-11-28 DIAGNOSIS — R112 Nausea with vomiting, unspecified: Secondary | ICD-10-CM

## 2021-11-29 MED ORDER — TRAMADOL HCL 50 MG PO TABS: 50.0000 mg | ORAL_TABLET | Freq: Four times a day (QID) | ORAL | 0 refills | Status: DC | PRN

## 2021-11-29 MED ORDER — PROMETHAZINE HCL 12.5 MG PO TABS: 12.5000 mg | ORAL_TABLET | Freq: Four times a day (QID) | ORAL | 0 refills | Status: DC | PRN

## 2021-12-06 ENCOUNTER — Other Ambulatory Visit: Payer: Self-pay | Admitting: Internal Medicine

## 2021-12-06 MED ORDER — ALPRAZOLAM 0.25 MG PO TABS: 0.2500 mg | ORAL_TABLET | Freq: Every evening | ORAL | 0 refills | Status: DC | PRN

## 2021-12-27 ENCOUNTER — Other Ambulatory Visit: Payer: Self-pay | Admitting: Internal Medicine

## 2021-12-27 DIAGNOSIS — G43009 Migraine without aura, not intractable, without status migrainosus: Secondary | ICD-10-CM

## 2021-12-27 NOTE — Telephone Encounter (Signed)
Hello,  ? ?I am unfamiliar with this patient. I am more than happy to look into it further.  ?

## 2022-01-04 ENCOUNTER — Other Ambulatory Visit: Payer: Self-pay | Admitting: Internal Medicine

## 2022-01-05 MED ORDER — ALPRAZOLAM 0.25 MG PO TABS: 0.2500 mg | ORAL_TABLET | Freq: Every evening | ORAL | 0 refills | Status: DC | PRN

## 2022-01-22 ENCOUNTER — Encounter: Payer: Self-pay | Admitting: Internal Medicine

## 2022-01-22 ENCOUNTER — Ambulatory Visit (INDEPENDENT_AMBULATORY_CARE_PROVIDER_SITE_OTHER): Payer: Self-pay | Admitting: Internal Medicine

## 2022-01-22 ENCOUNTER — Other Ambulatory Visit: Payer: Self-pay

## 2022-01-22 DIAGNOSIS — H6691 Otitis media, unspecified, right ear: Secondary | ICD-10-CM | POA: Insufficient documentation

## 2022-01-22 DIAGNOSIS — G43009 Migraine without aura, not intractable, without status migrainosus: Secondary | ICD-10-CM

## 2022-01-22 MED ORDER — CEFDINIR 300 MG PO CAPS: 300.0000 mg | ORAL_CAPSULE | Freq: Two times a day (BID) | ORAL | 0 refills | Status: AC

## 2022-01-22 MED ORDER — BENZONATATE 100 MG PO CAPS: 200.0000 mg | ORAL_CAPSULE | Freq: Three times a day (TID) | ORAL | 1 refills | Status: DC | PRN

## 2022-01-22 MED ORDER — TRAMADOL HCL 50 MG PO TABS: ORAL_TABLET | ORAL | 0 refills | Status: DC

## 2022-01-22 NOTE — Progress Notes (Signed)
? ? ?Subjective:  ? ?Patient ID: Donna Davis female   DOB: 1971-06-18 51 y.o.   MRN: [id number] ? ?HPI: ?Donna Davis is a 50 y.o. with PMHx of migraines, hearing difficulty s/p cochlear implant surgery x2, anxiety who presents with sinus pain and headache. See problem-based charting for full assessment and plan.  ? ? ? ?Past Medical History:  ?Diagnosis Date  ? Anxiety   ? Clostridium difficile infection   ? x several times  ? Deaf   ? uses sign language  ? Endometriosis   ? s/p TAH, LSO  ? Family history of malignant neoplasm of gastrointestinal tract   ? Gallstones   ? GERD (gastroesophageal reflux disease)   ? Hearing difficulty   ? b/l, 2/2 congenital rubella s/p stapedectomy. Using hearing aid  ? Hepatic cyst   ? 6 mm on CT done done in 05/2005  ? Hypothyroidism   ? IBS (irritable bowel syndrome)   ? Migraines   ? "frequency depends on my stress level" (07/23/2017)  ? Obesity   ? PONV (postoperative nausea and vomiting)   ? "sometimes; depends on how long I'm under"  ? Shingles 03/2013  ? ?Current Outpatient Medications  ?Medication Sig Dispense Refill  ? cefdinir (OMNICEF) 300 MG capsule Take 1 capsule (300 mg total) by mouth 2 (two) times daily for 7 days. 14 capsule 0  ? ALPRAZolam (XANAX) 0.25 MG tablet Take 1 tablet (0.25 mg total) by mouth at bedtime as needed for anxiety. 30 tablet 0  ? Ascorbic Acid (VITAMIN C PO) Take 1 tablet by mouth daily.    ? betamethasone valerate (VALISONE) 0.1 % cream APPLY 1 APPLICATION ON THE SKIN TWICE DAILY AS NEEDED  2  ? busPIRone (BUSPAR) 10 MG tablet TAKE 2 TABLETS BY MOUTH THREE TIMES DAILY 180 tablet 3  ? Calcium Carbonate (CALCIUM 600 PO) Take 1 tablet by mouth daily.    ? estradiol (ESTRACE) 1 MG tablet Take 1 tablet (1 mg total) by mouth daily. 90 tablet 3  ? fluconazole (DIFLUCAN) 150 MG tablet Take by mouth once and then again in 72 hours if no improvement in symptoms. 2 tablet 0  ? guaiFENesin-codeine (CHERATUSSIN AC) 100-10 MG/5ML syrup Take 5 mLs by mouth 3  (three) times daily as needed for up to 8 days for cough. 120 mL 0  ? Krill Oil (OMEGA-3) 500 MG CAPS Take 1 capsule by mouth daily.    ? levothyroxine (EUTHYROX) 25 MCG tablet Take 1 tablet (25 mcg total) by mouth daily. 90 tablet 3  ? meclizine (ANTIVERT) 50 MG tablet Take 1 tablet (50 mg total) by mouth 3 (three) times daily as needed. 30 tablet 0  ? methocarbamol (ROBAXIN) 500 MG tablet Take 1,000 mg by mouth 4 (four) times daily.    ? omeprazole (PRILOSEC) 20 MG capsule TAKE 1 CAPSULE BY MOUTH TWICE DAILY BEFORE A MEAL OR FOOD. 180 capsule 3  ? predniSONE (STERAPRED UNI-PAK 21 TAB) 10 MG (21) TBPK tablet Take as directed until finished 1 each 0  ? promethazine (PHENERGAN) 12.5 MG tablet Take 1 tablet (12.5 mg total) by mouth every 6 (six) hours as needed for nausea or vomiting. 30 tablet 0  ? traMADol (ULTRAM) 50 MG tablet TAKE 1 TABLET BY MOUTH UP TO EVERY 6HRS AS NEEDED FOR MODERATE PAIN*UP TO 30 DOSES 30 tablet 0  ? VITAMIN D PO One daily    ? ?No current facility-administered medications for this visit.  ? ?Family History  ?  Problem Relation Age of Onset  ? Breast cancer Mother   ? Ovarian cancer Mother   ? Prostate cancer Father   ? Diabetes Father   ? Heart disease Father   ?     heart attack  ? Irritable bowel syndrome Father   ? Kidney disease Father   ? Colon polyps Father   ? Stomach cancer Maternal Grandmother   ? Colon cancer Maternal Grandmother   ? Diabetes Maternal Grandmother   ? Heart disease Other   ?     both grandmothers  ? Bipolar disorder Daughter   ? ?Social History  ? ?Socioeconomic History  ? Marital status: Married  ?  Spouse name: Not on file  ? Number of children: 2  ? Years of education: Not on file  ? Highest education level: Not on file  ?Occupational History  ? Occupation: Art therapist  ?  Comment: works in [place]  ?Tobacco Use  ? Smoking status: Former  ?  Packs/day: 1.00  ?  Years: 26.00  ?  Pack years: 26.00  ?  Types: Cigarettes  ?  Quit date: 02/18/2008  ?  Years  since quitting: 13.9  ? Smokeless tobacco: Never  ? Tobacco comments:  ?  quit 2008  ?Vaping Use  ? Vaping Use: Never used  ?Substance and Sexual Activity  ? Alcohol use: Yes  ?  Comment: Occasionally.  ? Drug use: No  ? Sexual activity: Not Currently  ?  Partners: Male  ?  Birth control/protection: Surgical  ?Other Topics Concern  ? Not on file  ?Social History Narrative  ? Current smoker, not using alcohol or drugs at this time  ? Lives with husband and 2 children  ? ?Social Determinants of Health  ? ?Financial Resource Strain: Not on file  ?Food Insecurity: Not on file  ?Transportation Needs: Not on file  ?Physical Activity: Not on file  ?Stress: Not on file  ?Social Connections: Not on file  ? ?Review of Systems: ?Pertinent items are noted in HPI. ?Objective:  ?Physical Exam: ?Vitals:  ? 01/22/22 1542  ?BP: 117/63  ?Pulse: 81  ?Resp: (!) 24  ?Temp: 98.9 ?F (37.2 ?C)  ?TempSrc: Oral  ?SpO2: 100%  ?Weight: 190 lb 8 oz (86.4 kg)  ?Height: '5\' 4"'$  (1.626 m)  ? ?BP 117/63 (BP Location: Left Arm, Patient Position: Sitting, Cuff Size: Normal)   Pulse 81   Temp 98.9 ?F (37.2 ?C) (Oral)   Resp (!) 24   Ht '5\' 4"'$  (1.626 m)   Wt 190 lb 8 oz (86.4 kg)   LMP 12/26/2012   SpO2 100% Comment: room air  BMI 32.70 kg/m?  ? ?General Appearance:    Alert, cooperative, distressed, using sign language with interpreter effectively  ?Head:    Normocephalic, atraumatic  ?Eyes:    Conjunctiva clear  ?Ears:    No drainage or discharge. Mild pain with with manipulation of R ear, R TM erythematous, normal L TM  ?Nose:   Nares pale and boggy, no drainage or discharge, sinus tenderness present  ?Throat:   Lips, mucosa, and tongue normal; teeth and gums normal  ?Neck:   Supple, symmetrical, trachea midline, no adenopathy  ?Lungs:     Clear to auscultation bilaterally, respirations unlabored  ? Heart:    Regular rate and rhythm, S1 and S2 normal, no murmur, rub   or gallop  ?Lymph nodes:   Tender 1cm R cervical lymph node, supraclavicular  nodes normal  ? ?Assessment & Plan:  ? ?  See encounters tab for problem-based charting. ? ?Right middle ear infection ?Patient has had sinus pain and headache that have mostly impacted the R side of her head for 2 weeks. She says since Thursday, 5/4, her condition has worsened with constant cough, sinus pain, and teeth pain. She reports fevers off and on for the past 2 days, but she has not been checking her temperature. OTC cold medicine and cough syrup have provided limited relief. She has a history of seasonal allergies, but for the past 3 years, her allergies have not affected her as much. She moved back to [place] from [place] last Sunday, and she believes this may have triggered her symptoms. She says she has had significant difficulty of all auditory processing using cochlear implant over past few days, and this has presented significant difficulty communicating with her husband at home. ? ?On exam, she is in distress with a constant cough. Her nares were boggy and pale, she had a tender, reactive 1cm R cervical lymph node, and clear lungs. Her R ear had a very erythematous tympanic membrane, but there was no drainage and mild pain with manipulation. Because she had cochlear implant surgery in 2017 and 2019, she is concerned about infection of the implant, and she does not want to undergo surgery again.  ? ?Believe she most likely developed acute otitis media secondary to a viral upper respiratory infection. We plan to start patient on cefdinir, and also recommended Mucinex with lots of hydration. Patient was requesting a refill of tramadol because it helps with the headache pain and she only has a few pills left, so we placed the order. Told patient she must present to the ED if she develops constant fevers, vision changes, drainage from her ear, neck stiffness, etc. If her symptoms do worsen, will need to consider head imaging to further assess infection. She does not have insurance at this time, so would not pursue  unless clinically indicated. Told patient we would like to follow-up in 1 week and will likely need a clinic appointment to ensure the presence of interpretation services. ? ?Plan ?- Start Cefdinir '300mg'$  BID for

## 2022-01-22 NOTE — Patient Instructions (Addendum)
Donna Davis, ? ?It was very nice to see you in clinic today. Here are a few reminders from what we discussed: ? ?We are sorry you have been feeling bad. We believe you have an ear infection that has probably resulted from a viral upper respiratory infection. We would like for you to start taking the antibiotic cefdinir twice daily for 7 days. ?If you develop constant fevers, vision changes, drainage from your ear, neck stiffness, then present to the nearest Emergency Department. ?We recommend Mucinex, hydration with lots of fluids, and we will order tessalon perles to help with the cough. ? ?We would like for you to schedule a follow-up appointment in 1 week so we can see how you are doing. ?

## 2022-01-22 NOTE — Progress Notes (Signed)
Attestation for Student Documentation: ? ?I personally was present and performed or re-performed the history, physical exam and medical decision-making activities of this service and have verified that the service and findings are accurately documented in the student?s note. With the exception(s) noted that the patient has a bacterial acute R otitis media likely 2/2 to viral sinusitis. Her tramadol was refilled for her chronic migraines, which were likely triggered secondary to her bacterial infection. She showed no meningeal symptoms on her physical examination, there was a reactive 1 cm tender, lymph node noted on the R anterior cervical chain.  ? ?Maudie Mercury, MD ?01/22/2022, 5:15 PM ? ?

## 2022-01-22 NOTE — Assessment & Plan Note (Addendum)
Patient has had sinus pain and headache that have mostly impacted the R side of her head for 2 weeks. She says since Thursday, 5/4, her condition has worsened with constant cough, sinus pain, and teeth pain. She reports fevers off and on for the past 2 days, but she has not been checking her temperature. OTC cold medicine and cough syrup have provided limited relief. She has a history of seasonal allergies, but for the past 3 years, her allergies have not affected her as much. She moved back to [place] from [place] last Sunday, and she believes local pollen may be contributing to current symptoms. She says she has had significant difficulty of all auditory processing using cochlear implant over past few days, and this has presented significant difficulty communicating with her husband at home. ? ?On exam, she is afebrile but in distress with a constant cough. Her nares were boggy and pale, she has a tender, reactive 1cm R cervical lymph node, and clear lungs. Her R ear has a very erythematous tympanic membrane, but there is no drainage and mild pain with manipulation. Because she had cochlear implant surgery in 2017 and 2019, she is concerned about infection of the implant, and she does not want to undergo implant surgery again.  ? ?She most likely developed acute otitis media secondary to a viral sinusitis. We plan to start patient on cefdinir and also recommended Mucinex with hydration. Patient was requesting a refill of tramadol because it helps with the headache pain and she only has 3 pills left, so we placed the order. Her tramadol is for her chronic migraines. Told patient she must present to the ED if she develops constant fevers, vision changes, drainage from her ear, neck stiffness, etc. If her symptoms do worsen, will need to consider head imaging to further assess infection. She does not have insurance at this time, so would not pursue unless clinically indicated and absolutely necessary. Told patient we would  like to follow-up in 1 week and will likely need a clinic appointment to ensure the presence of interpretation services. ? ?Plan ?- Start cefdinir '300mg'$  BID for 7 days ?- Refilled Tramadol to assist with pain ?- Recommended Mucinex ?- Encouraged hydration ?- Gave strict return precautions and told patient to present to ED if symptoms worsen ?- Follow-up appointment in 1 week ?

## 2022-01-23 NOTE — Progress Notes (Signed)
Internal Medicine Clinic Attending ? ?Case discussed with Dr. Winters  At the time of the visit.  We reviewed the resident?s history and exam and pertinent patient test results.  I agree with the assessment, diagnosis, and plan of care documented in the resident?s note.  ?

## 2022-02-05 ENCOUNTER — Other Ambulatory Visit: Payer: Self-pay | Admitting: Internal Medicine

## 2022-02-06 MED ORDER — ALPRAZOLAM 0.25 MG PO TABS: 0.2500 mg | ORAL_TABLET | Freq: Every evening | ORAL | 0 refills | Status: DC | PRN

## 2022-02-06 NOTE — Telephone Encounter (Signed)
Would one of you mind filling this?  PDMP is okay.  I don't have a fingerprint machine in my temporary office!

## 2022-02-08 ENCOUNTER — Other Ambulatory Visit: Payer: Self-pay | Admitting: Internal Medicine

## 2022-02-08 ENCOUNTER — Encounter: Payer: Self-pay | Admitting: Internal Medicine

## 2022-02-15 ENCOUNTER — Other Ambulatory Visit: Payer: Self-pay | Admitting: Internal Medicine

## 2022-02-15 DIAGNOSIS — G43009 Migraine without aura, not intractable, without status migrainosus: Secondary | ICD-10-CM

## 2022-02-16 MED ORDER — TRAMADOL HCL 50 MG PO TABS: ORAL_TABLET | ORAL | 0 refills | Status: DC

## 2022-02-28 ENCOUNTER — Other Ambulatory Visit: Payer: Self-pay | Admitting: Internal Medicine

## 2022-03-07 ENCOUNTER — Encounter: Payer: Self-pay | Admitting: Internal Medicine

## 2022-03-12 ENCOUNTER — Other Ambulatory Visit: Payer: Self-pay

## 2022-03-12 NOTE — Telephone Encounter (Signed)
ALPRAZolam (XANAX) 0.25 MG tablet, REFILL REQUEST @ [id number] - [place], [place] - [address].

## 2022-03-13 MED ORDER — ALPRAZOLAM 0.25 MG PO TABS: 0.2500 mg | ORAL_TABLET | Freq: Every evening | ORAL | 0 refills | Status: DC | PRN

## 2022-03-14 ENCOUNTER — Other Ambulatory Visit: Payer: Self-pay | Admitting: Internal Medicine

## 2022-03-14 DIAGNOSIS — R112 Nausea with vomiting, unspecified: Secondary | ICD-10-CM

## 2022-03-14 DIAGNOSIS — G43009 Migraine without aura, not intractable, without status migrainosus: Secondary | ICD-10-CM

## 2022-03-14 MED ORDER — PROMETHAZINE HCL 12.5 MG PO TABS: 12.5000 mg | ORAL_TABLET | Freq: Four times a day (QID) | ORAL | 0 refills | Status: DC | PRN

## 2022-03-14 MED ORDER — TRAMADOL HCL 50 MG PO TABS
ORAL_TABLET | ORAL | 0 refills | Status: DC
Start: 2022-03-14 — End: 2022-04-10

## 2022-04-09 ENCOUNTER — Encounter: Payer: Self-pay | Admitting: Internal Medicine

## 2022-04-09 ENCOUNTER — Other Ambulatory Visit: Payer: Self-pay | Admitting: Internal Medicine

## 2022-04-09 DIAGNOSIS — G43009 Migraine without aura, not intractable, without status migrainosus: Secondary | ICD-10-CM

## 2022-04-10 MED ORDER — TRAMADOL HCL 50 MG PO TABS: ORAL_TABLET | ORAL | 0 refills | Status: DC

## 2022-04-10 MED ORDER — ALPRAZOLAM 0.25 MG PO TABS: 0.2500 mg | ORAL_TABLET | Freq: Every evening | ORAL | 0 refills | Status: DC | PRN

## 2022-05-08 ENCOUNTER — Encounter: Payer: Self-pay | Admitting: Internal Medicine

## 2022-05-08 ENCOUNTER — Other Ambulatory Visit: Payer: Self-pay | Admitting: *Deleted

## 2022-05-08 DIAGNOSIS — G43009 Migraine without aura, not intractable, without status migrainosus: Secondary | ICD-10-CM

## 2022-05-08 MED ORDER — TRAMADOL HCL 50 MG PO TABS: ORAL_TABLET | ORAL | 0 refills | Status: DC

## 2022-05-08 MED ORDER — BETAMETHASONE VALERATE 0.1 % EX CREA: TOPICAL_CREAM | CUTANEOUS | 2 refills | Status: DC

## 2022-05-08 MED ORDER — ALPRAZOLAM 0.25 MG PO TABS: 0.2500 mg | ORAL_TABLET | Freq: Every evening | ORAL | 0 refills | Status: DC | PRN

## 2022-05-16 ENCOUNTER — Ambulatory Visit (HOSPITAL_COMMUNITY)
Admission: EM | Admit: 2022-05-16 | Discharge: 2022-05-16 | Disposition: A | Discharge status: Home / Self Care | Payer: BC Managed Care – PPO | Attending: Physician Assistant | Admitting: Physician Assistant

## 2022-05-16 ENCOUNTER — Encounter (HOSPITAL_COMMUNITY): Payer: Self-pay

## 2022-05-16 DIAGNOSIS — R21 Rash and other nonspecific skin eruption: Secondary | ICD-10-CM

## 2022-05-16 MED ORDER — TRIAMCINOLONE ACETONIDE 0.5 % EX OINT: 1.0000 | TOPICAL_OINTMENT | Freq: Two times a day (BID) | CUTANEOUS | 0 refills | Status: DC

## 2022-05-16 MED ORDER — HYDROXYZINE HCL 25 MG PO TABS: 25.0000 mg | ORAL_TABLET | Freq: Four times a day (QID) | ORAL | 0 refills | Status: DC

## 2022-05-16 NOTE — ED Provider Notes (Signed)
[name]    CSN: [id number] Arrival date & time: 05/16/22  1811      History   Chief Complaint Chief Complaint  Patient presents with   Rash    HPI Donna Davis is a 51 y.o. female.   Patient here today for evaluation of a rash to her legs that started about a week ago that is not improving. She reports rash seems to be spreading. She has had few lesions to her arms but nothing to her trunk, face. She reports rash is exquisitely itchy. She has tried betamethasone cream without improvement. She denies any recent change in detergent, lotion, etc. She has been outside but states she has been in long pants and long sleeves.  The history is provided by the patient. The history is limited by a language barrier. A language interpreter was used ([employer]- ASL).  Rash Associated symptoms: no fever, no nausea, no shortness of breath and not vomiting     Past Medical History:  Diagnosis Date   Anxiety    Clostridium difficile infection    x several times   Deaf    uses sign language   Endometriosis    s/p TAH, LSO   Family history of malignant neoplasm of gastrointestinal tract    Gallstones    GERD (gastroesophageal reflux disease)    Hearing difficulty    b/l, 2/2 congenital rubella s/p stapedectomy. Using hearing aid   Hepatic cyst    6 mm on CT done done in 05/2005   Hypothyroidism    IBS (irritable bowel syndrome)    Migraines    "frequency depends on my stress level" (07/23/2017)   Obesity    PONV (postoperative nausea and vomiting)    "sometimes; depends on how long I'm under"   Shingles 03/2013    Patient Active Problem List   Diagnosis Date Noted   Right middle ear infection 01/22/2022   Elevated BP without diagnosis of hypertension 07/17/2021   Vaginal irritation 07/17/2021   COVID-19 long hauler manifesting chronic fatigue 05/16/2021   Arthralgia of right temporomandibular joint 08/26/2018   Endometriosis 04/09/2018   Hip pain, acute,  right 04/09/2018   Skin rash 04/04/2017   Allergic rhinitis due to allergen 12/19/2016   Cochlear implant in place 12/05/2016   Bilateral deafness 01/17/2016   Family history of breast cancer in mother 08/26/2015   Obesity (BMI 30-39.9) 02/18/2014   Amitriptyline adverse reaction 04/08/2013   Migraine headache 12/02/2012   Routine adult health maintenance 09/01/2012   Anxiety state 09/03/2006   Hypothyroidism 07/01/2006   GERD 07/01/2006   IBS 07/01/2006    Past Surgical History:  Procedure Laterality Date   ABDOMINAL ADHESION SURGERY     Enterolysis   CESAREAN SECTION  1994   CHOLECYSTECTOMY N/A 06/26/2013   Procedure: LAPAROSCOPIC CHOLECYSTECTOMY WITH ATTEMPTED INTRAOPERATIVE CHOLANGIOGRAM;  Surgeon: Harl Bowie, MD;  Location: [facility];  Service: General;  Laterality: N/A;   COCHLEAR IMPLANT  03/30/2016   COCHLEAR IMPLANT Right 03/2015   COLONOSCOPY WITH PROPOFOL  04/23/2012   DIAGNOSTIC LAPAROSCOPY  01/14/2006   "I've had several" (07/23/2017)   ESOPHAGOGASTRODUODENOSCOPY (EGD) WITH PROPOFOL  04/23/2012   JOINT REPLACEMENT     KNEE SURGERY Right 2014   LAPAROSCOPIC OVARIAN CYSTECTOMY Right 04/16/2000   SALPINGOOPHORECTOMY Left    STAPEDECTOMY Left 05/14/2006   Thyroid Nodule removed  [place]  01/13/2004   Endometriosis with residual right ovary. Multiple  GYN surgeries for chronic pelvic pain; had LSO in past   TUBAL LIGATION     WOUND EXPLORATION Right 10/16/2016   Procedure: RIGHT EXPLORATION OF LACERATION OF INDEX FINGER;  Surgeon: Leanora Cover, MD;  Location: [facility];  Service: Orthopedics;  Laterality: Right;    OB History     Gravida  3   Para  2   Term  2   Preterm      AB  1   Living  2      SAB      IAB  1   Ectopic      Multiple      Live Births               Home Medications    Prior to Admission medications   Medication Sig Start Date End Date Taking?  Authorizing Provider  hydrOXYzine (ATARAX) 25 MG tablet Take 1 tablet (25 mg total) by mouth every 6 (six) hours. 05/16/22  Yes Francene Finders, PA-C  triamcinolone ointment (KENALOG) 0.5 % Apply 1 Application topically 2 (two) times daily. 05/16/22  Yes Francene Finders, PA-C  ALPRAZolam Duanne Moron) 0.25 MG tablet Take 1 tablet (0.25 mg total) by mouth at bedtime as needed for anxiety. 05/08/22   Sid Falcon, MD  Ascorbic Acid (VITAMIN C PO) Take 1 tablet by mouth daily.    [provider]  betamethasone valerate (VALISONE) 0.1 % cream APPLY 1 APPLICATION ON THE SKIN TWICE DAILY AS NEEDED 05/08/22   Sid Falcon, MD  busPIRone (BUSPAR) 10 MG tablet TAKE 2 TABLETS BY MOUTH THREE TIMES DAILY 02/28/22   Sid Falcon, MD  Calcium Carbonate (CALCIUM 600 PO) Take 1 tablet by mouth daily.    [provider]  estradiol (ESTRACE) 1 MG tablet Take 1 tablet (1 mg total) by mouth daily. 09/06/21   Sid Falcon, MD  fluconazole (DIFLUCAN) 150 MG tablet Take by mouth once and then again in 72 hours if no improvement in symptoms. 07/06/21   Sid Falcon, MD  guaiFENesin-codeine (CHERATUSSIN AC) 100-10 MG/5ML syrup Take 5 mLs by mouth 3 (three) times daily as needed for up to 8 days for cough. 05/17/21 05/25/21  Rick Duff, MD  Javier Docker Oil (OMEGA-3) 500 MG CAPS Take 1 capsule by mouth daily.    [provider]  levothyroxine (EUTHYROX) 25 MCG tablet Take 1 tablet (25 mcg total) by mouth daily. 09/06/21   Sid Falcon, MD  meclizine (ANTIVERT) 50 MG tablet Take 1 tablet (50 mg total) by mouth 3 (three) times daily as needed. 06/30/21   Sid Falcon, MD  methocarbamol (ROBAXIN) 500 MG tablet Take 1,000 mg by mouth 4 (four) times daily. 06/19/21   [provider]  omeprazole (PRILOSEC) 20 MG capsule TAKE 1 CAPSULE BY MOUTH TWICE DAILY BEFORE A MEAL OR FOOD. 08/24/21   Sid Falcon, MD  predniSONE (STERAPRED UNI-PAK 21 TAB) 10 MG (21) TBPK tablet Take as directed  until finished 10/22/21   Garald Balding, PA-C  promethazine (PHENERGAN) 12.5 MG tablet Take 1 tablet (12.5 mg total) by mouth every 6 (six) hours as needed for nausea or vomiting. 03/14/22   Sid Falcon, MD  traMADol (ULTRAM) 50 MG tablet TAKE 1 TABLET BY MOUTH UP TO EVERY 6HRS AS NEEDED FOR MODERATE PAIN*UP TO 30 DOSES 05/08/22   Sid Falcon, MD  VITAMIN D PO One daily    [provider]  Family History Family History  Problem Relation Age of Onset   Breast cancer Mother    Ovarian cancer Mother    Prostate cancer Father    Diabetes Father    Heart disease Father        heart attack   Irritable bowel syndrome Father    Kidney disease Father    Colon polyps Father    Stomach cancer Maternal Grandmother    Colon cancer Maternal Grandmother    Diabetes Maternal Grandmother    Heart disease Other        both grandmothers   Bipolar disorder Daughter     Social History Social History   Tobacco Use   Smoking status: Former    Packs/day: 1.00    Years: 26.00    Total pack years: 26.00    Types: Cigarettes    Quit date: 02/18/2008    Years since quitting: 14.2   Smokeless tobacco: Never   Tobacco comments:    quit 2008  Vaping Use   Vaping Use: Never used  Substance Use Topics   Alcohol use: Yes    Comment: Occasionally.   Drug use: No     Allergies   Onion, Other, Shellfish allergy, Amitriptyline, Amoxicillin-pot clavulanate, Amoxicillin-pot clavulanate, Cefuroxime, Cephalexin, Ciprofloxacin, Clarithromycin, Clarithromycin, Doxycycline, Propranolol, Sulfonamide derivatives, Sulfa antibiotics, Sulfasalazine, Benadryl [diphenhydramine hcl (sleep)], and Shrimp flavor   Review of Systems Review of Systems  Constitutional:  Negative for chills and fever.  Eyes:  Negative for discharge and redness.  Respiratory:  Negative for shortness of breath.   Gastrointestinal:  Negative for nausea and vomiting.  Skin:  Positive for rash.     Physical Exam Triage  Vital Signs ED Triage Vitals [05/16/22 1853]  Enc Vitals Group     BP (!) 144/72     Pulse Rate 83     Resp 18     Temp 98.6 F (37 C)     Temp Source Oral     SpO2 99 %     Weight      Height      Head Circumference      Peak Flow      Pain Score 10     Pain Loc      Pain Edu?      Excl. in [place]?    No data found.  Updated Vital Signs BP (!) 144/72 (BP Location: Left Arm)   Pulse 83   Temp 98.6 F (37 C) (Oral)   Resp 18   LMP 12/26/2004   SpO2 99%   Physical Exam Vitals and nursing note reviewed.  Constitutional:      General: She is not in acute distress.    Appearance: Normal appearance. She is not ill-appearing.  HENT:     Head: Normocephalic and atraumatic.  Eyes:     Conjunctiva/sclera: Conjunctivae normal.  Cardiovascular:     Rate and Rhythm: Normal rate.  Pulmonary:     Effort: Pulmonary effort is normal.  Skin:    Comments: Scattered erythematous papular lesions some in clusters to bilateral lower legs diffusely with signs of excoriation.  Neurological:     Mental Status: She is alert.  Psychiatric:        Mood and Affect: Mood normal.        Behavior: Behavior normal.        Thought Content: Thought content normal.      UC Treatments / Results  Labs (all labs ordered are listed, but only abnormal  results are displayed) Labs Reviewed - No data to display  EKG   Radiology No results found.  Procedures Procedures (including critical care time)  Medications Ordered in UC Medications - No data to display  Initial Impression / Assessment and Plan / UC Course  I have reviewed the triage vital signs and the nursing notes.  Pertinent labs & imaging results that were available during my care of the patient were reviewed by me and considered in my medical decision making (see chart for details).    Unclear etiology of rash but does not seem consistent with bedbugs as no other member of the family has rash. Does not appear to be other insect  bite. I am more suspicious of poison ivy although patient denies contact that she is aware of.  Patient does not tolerate oral or injectable steroids, will treat with higher potency topical steroid as well as Atarax in hopes to help control itching.  Encouraged follow-up if no gradual improvement or with any further concerns.  Final Clinical Impressions(s) / UC Diagnoses   Final diagnoses:  Rash   Discharge Instructions   None    ED Prescriptions     Medication Sig Dispense Auth. Provider   hydrOXYzine (ATARAX) 25 MG tablet Take 1 tablet (25 mg total) by mouth every 6 (six) hours. 12 tablet Ewell Poe F, PA-C   triamcinolone ointment (KENALOG) 0.5 % Apply 1 Application topically 2 (two) times daily. 30 g Francene Finders, PA-C      PDMP not reviewed this encounter.   Francene Finders, PA-C 05/16/22 2052

## 2022-05-16 NOTE — ED Triage Notes (Signed)
Pt c/o red itchy rash from waist down for over a week. Using Betamethasone cream with no relief.

## 2022-05-18 ENCOUNTER — Encounter (HOSPITAL_COMMUNITY): Payer: Self-pay

## 2022-05-18 ENCOUNTER — Ambulatory Visit (HOSPITAL_COMMUNITY)
Admission: RE | Admit: 2022-05-18 | Discharge: 2022-05-18 | Disposition: A | Discharge status: Home / Self Care | Payer: BC Managed Care – PPO | Source: Ambulatory Visit | Attending: Emergency Medicine | Admitting: Emergency Medicine

## 2022-05-18 VITALS — BP 133/81 | HR 88 | Temp 98.4°F | Resp 17

## 2022-05-18 DIAGNOSIS — R21 Rash and other nonspecific skin eruption: Secondary | ICD-10-CM | POA: Diagnosis not present

## 2022-05-18 MED ORDER — CLOBETASOL PROPIONATE 0.05 % EX OINT
1.0000 | TOPICAL_OINTMENT | Freq: Two times a day (BID) | CUTANEOUS | 0 refills | Status: DC
Start: 2022-05-18 — End: 2022-09-08

## 2022-05-18 MED ORDER — FAMOTIDINE 20 MG PO TABS: 20.0000 mg | ORAL_TABLET | Freq: Two times a day (BID) | ORAL | 0 refills | Status: DC

## 2022-05-18 MED ORDER — CEPHALEXIN 500 MG PO CAPS: 500.0000 mg | ORAL_CAPSULE | Freq: Two times a day (BID) | ORAL | 0 refills | Status: AC

## 2022-05-18 NOTE — ED Provider Notes (Signed)
[place]    CSN: [id number] Arrival date & time: 05/18/22  1627      History   Chief Complaint Chief Complaint  Patient presents with   Rash    HPI Donna Davis is a 51 y.o. female.   Patient presents with rash to the bilateral lower extremities beginning 2 weeks ago.  Initially started as 1 lesion to the right lower extremity but has since spread and has begun to notice spots on the left upper arm.  Endorses that she has had the symptoms occur before but typically resolved with the cream provided by her PCP however this time there is no improvement.  Endorses per pruritus and clear light drainage noted from pustules with yellow crusting.  Endorses that the yellow crusting goes away after use of the steroid cream.  Denies fever or chills.  Was evaluated 4 days ago in urgent care and prescribed hydroxyzine and triamcinolone, endorses that the hydroxyzine causes exacerbations of her migraines and therefore her PCP advised her to stop taking.  Denies changes in soaps, lotions detergents, exposure to grass or wooded areas, dietary changes or recent travel.  Denies medication changes.  No other member of household has similar symptoms.  [employer] interpreter used  Past Medical History:  Diagnosis Date   Anxiety    Clostridium difficile infection    x several times   Deaf    uses sign language   Endometriosis    s/p TAH, LSO   Family history of malignant neoplasm of gastrointestinal tract    Gallstones    GERD (gastroesophageal reflux disease)    Hearing difficulty    b/l, 2/2 congenital rubella s/p stapedectomy. Using hearing aid   Hepatic cyst    6 mm on CT done done in 05/2005   Hypothyroidism    IBS (irritable bowel syndrome)    Migraines    "frequency depends on my stress level" (07/23/2017)   Obesity    PONV (postoperative nausea and vomiting)    "sometimes; depends on how long I'm under"   Shingles 03/2013    Patient Active Problem List   Diagnosis Date Noted    Right middle ear infection 01/22/2022   Elevated BP without diagnosis of hypertension 07/17/2021   Vaginal irritation 07/17/2021   COVID-19 long hauler manifesting chronic fatigue 05/16/2021   Arthralgia of right temporomandibular joint 08/26/2018   Endometriosis 04/09/2018   Hip pain, acute, right 04/09/2018   Skin rash 04/04/2017   Allergic rhinitis due to allergen 12/19/2016   Cochlear implant in place 12/05/2016   Bilateral deafness 01/17/2016   Family history of breast cancer in mother 08/26/2015   Obesity (BMI 30-39.9) 02/18/2014   Amitriptyline adverse reaction 04/08/2013   Migraine headache 12/02/2012   Routine adult health maintenance 09/01/2012   Anxiety state 09/03/2006   Hypothyroidism 07/01/2006   GERD 07/01/2006   IBS 07/01/2006    Past Surgical History:  Procedure Laterality Date   ABDOMINAL ADHESION SURGERY     Enterolysis   CESAREAN SECTION  1994   CHOLECYSTECTOMY N/A 06/26/2013   Procedure: LAPAROSCOPIC CHOLECYSTECTOMY WITH ATTEMPTED INTRAOPERATIVE CHOLANGIOGRAM;  Surgeon: Harl Bowie, MD;  Location: [facility];  Service: General;  Laterality: N/A;   COCHLEAR IMPLANT  03/30/2016   COCHLEAR IMPLANT Right 03/2015   COLONOSCOPY WITH PROPOFOL  04/23/2012   DIAGNOSTIC LAPAROSCOPY  01/14/2006   "I've had several" (07/23/2017)   ESOPHAGOGASTRODUODENOSCOPY (EGD) WITH PROPOFOL  04/23/2012   JOINT REPLACEMENT     KNEE SURGERY Right  2014   LAPAROSCOPIC OVARIAN CYSTECTOMY Right 04/16/2000   SALPINGOOPHORECTOMY Left    STAPEDECTOMY Left 05/14/2006   Thyroid Nodule removed  [place]  01/13/2004   Endometriosis with residual right ovary. Multiple GYN surgeries for chronic pelvic pain; had LSO in past   TUBAL LIGATION     WOUND EXPLORATION Right 10/16/2016   Procedure: RIGHT EXPLORATION OF LACERATION OF INDEX FINGER;  Surgeon: Leanora Cover, MD;  Location: [facility];  Service: Orthopedics;  Laterality: Right;     OB History     Gravida  3   Para  2   Term  2   Preterm      AB  1   Living  2      SAB      IAB  1   Ectopic      Multiple      Live Births               Home Medications    Prior to Admission medications   Medication Sig Start Date End Date Taking? Authorizing Provider  cephALEXin (KEFLEX) 500 MG capsule Take 1 capsule (500 mg total) by mouth 2 (two) times daily for 5 days. 05/18/22 05/23/22 Yes Roselene Gray R, NP  clobetasol ointment (TEMOVATE) 1.75 % Apply 1 Application topically 2 (two) times daily. 05/18/22  Yes Thessaly Mccullers R, NP  famotidine (PEPCID) 20 MG tablet Take 1 tablet (20 mg total) by mouth 2 (two) times daily for 7 days. 05/18/22 05/25/22 Yes Duke Weisensel, Leitha Schuller, NP  ALPRAZolam (XANAX) 0.25 MG tablet Take 1 tablet (0.25 mg total) by mouth at bedtime as needed for anxiety. 05/08/22   Sid Falcon, MD  Ascorbic Acid (VITAMIN C PO) Take 1 tablet by mouth daily.    [provider]  betamethasone valerate (VALISONE) 0.1 % cream APPLY 1 APPLICATION ON THE SKIN TWICE DAILY AS NEEDED 05/08/22   Sid Falcon, MD  busPIRone (BUSPAR) 10 MG tablet TAKE 2 TABLETS BY MOUTH THREE TIMES DAILY 02/28/22   Sid Falcon, MD  Calcium Carbonate (CALCIUM 600 PO) Take 1 tablet by mouth daily.    [provider]  estradiol (ESTRACE) 1 MG tablet Take 1 tablet (1 mg total) by mouth daily. 09/06/21   Sid Falcon, MD  fluconazole (DIFLUCAN) 150 MG tablet Take by mouth once and then again in 72 hours if no improvement in symptoms. 07/06/21   Sid Falcon, MD  guaiFENesin-codeine (CHERATUSSIN AC) 100-10 MG/5ML syrup Take 5 mLs by mouth 3 (three) times daily as needed for up to 8 days for cough. 05/17/21 05/25/21  Rick Duff, MD  hydrOXYzine (ATARAX) 25 MG tablet Take 1 tablet (25 mg total) by mouth every 6 (six) hours. 05/16/22   Francene Finders, PA-C  Krill Oil (OMEGA-3) 500 MG CAPS Take 1 capsule by mouth daily.    [provider]   levothyroxine (EUTHYROX) 25 MCG tablet Take 1 tablet (25 mcg total) by mouth daily. 09/06/21   Sid Falcon, MD  meclizine (ANTIVERT) 50 MG tablet Take 1 tablet (50 mg total) by mouth 3 (three) times daily as needed. 06/30/21   Sid Falcon, MD  methocarbamol (ROBAXIN) 500 MG tablet Take 1,000 mg by mouth 4 (four) times daily. 06/19/21   [provider]  omeprazole (PRILOSEC) 20 MG capsule TAKE 1 CAPSULE BY MOUTH TWICE DAILY BEFORE A MEAL OR FOOD. 08/24/21   Daryll Drown,  Peri Jefferson, MD  predniSONE (STERAPRED UNI-PAK 21 TAB) 10 MG (21) TBPK tablet Take as directed until finished 10/22/21   Garald Balding, PA-C  promethazine (PHENERGAN) 12.5 MG tablet Take 1 tablet (12.5 mg total) by mouth every 6 (six) hours as needed for nausea or vomiting. 03/14/22   Sid Falcon, MD  traMADol (ULTRAM) 50 MG tablet TAKE 1 TABLET BY MOUTH UP TO EVERY 6HRS AS NEEDED FOR MODERATE PAIN*UP TO 30 DOSES 05/08/22   Sid Falcon, MD  triamcinolone ointment (KENALOG) 0.5 % Apply 1 Application topically 2 (two) times daily. 05/16/22   Francene Finders, PA-C  VITAMIN D PO One daily    [provider]    Family History Family History  Problem Relation Age of Onset   Breast cancer Mother    Ovarian cancer Mother    Prostate cancer Father    Diabetes Father    Heart disease Father        heart attack   Irritable bowel syndrome Father    Kidney disease Father    Colon polyps Father    Stomach cancer Maternal Grandmother    Colon cancer Maternal Grandmother    Diabetes Maternal Grandmother    Heart disease Other        both grandmothers   Bipolar disorder Daughter     Social History Social History   Tobacco Use   Smoking status: Former    Packs/day: 1.00    Years: 26.00    Total pack years: 26.00    Types: Cigarettes    Quit date: 02/18/2008    Years since quitting: 14.2   Smokeless tobacco: Never   Tobacco comments:    quit 2008  Vaping Use   Vaping Use: Never used  Substance Use  Topics   Alcohol use: Yes    Comment: Occasionally.   Drug use: No     Allergies   Onion, Other, Shellfish allergy, Amitriptyline, Amoxicillin-pot clavulanate, Amoxicillin-pot clavulanate, Cefuroxime, Cephalexin, Ciprofloxacin, Clarithromycin, Clarithromycin, Doxycycline, Propranolol, Sulfonamide derivatives, Sulfa antibiotics, Sulfasalazine, Benadryl [diphenhydramine hcl (sleep)], and Shrimp flavor   Review of Systems Review of Systems  Constitutional: Negative.   Respiratory: Negative.    Cardiovascular: Negative.   Skin:  Positive for rash. Negative for color change, pallor and wound.     Physical Exam Triage Vital Signs ED Triage Vitals [05/18/22 1648]  Enc Vitals Group     BP 133/81     Pulse Rate 88     Resp 17     Temp 98.4 F (36.9 C)     Temp Source Oral     SpO2 96 %     Weight      Height      Head Circumference      Peak Flow      Pain Score      Pain Loc      Pain Edu?      Excl. in [place]?    No data found.  Updated Vital Signs BP 133/81 (BP Location: Left Arm)   Pulse 88   Temp 98.4 F (36.9 C) (Oral)   Resp 17   LMP 12/26/2004   SpO2 96%   Visual Acuity Right Eye Distance:   Left Eye Distance:   Bilateral Distance:    Right Eye Near:   Left Eye Near:    Bilateral Near:     Physical Exam Constitutional:      Appearance: Normal appearance.  HENT:     Head:  Normocephalic.  Eyes:     Extraocular Movements: Extraocular movements intact.  Pulmonary:     Effort: Pulmonary effort is normal.  Skin:    Comments: Erythematous blistering and papular rash present to the bilateral lower extremities, 1-2 less than 0.5 cm noted to the left upper extremity to the anterior aspect, 1 significantly larger lesion approximately 2 x 3 cm noted to the medial aspect of the right lower extremity, lesion is erythematous with some Jeremiah Curci crusting  Neurological:     Mental Status: She is alert and oriented to person, place, and time. Mental status is at baseline.   Psychiatric:        Mood and Affect: Mood normal.        Behavior: Behavior normal.      UC Treatments / Results  Labs (all labs ordered are listed, but only abnormal results are displayed) Labs Reviewed - No data to display  EKG   Radiology No results found.  Procedures Procedures (including critical care time)  Medications Ordered in UC Medications - No data to display  Initial Impression / Assessment and Plan / UC Course  I have reviewed the triage vital signs and the nursing notes.  Pertinent labs & imaging results that were available during my care of the patient were reviewed by me and considered in my medical decision making (see chart for details).  Rash  Rash appears to be inflammatory process however as she has attempted use of topical steroids with no signs of improvement we will provide bacterial coverage, increase the strength of topical steroid to clobetasol and prescribed oral Keflex which patient endorses will make her vomit but she has nausea and vomiting medication at home that she will use to tolerate medication, patient endorses antihistamines causes hallucinations but she is able to tolerate famotidine and therefore have prescribed famotidine twice daily for 7 days to provide some coverage to the receptors, discussed treatment plan with patient, recommended topical Benadryl cream for additional support for pruritus and recommended follow-up if no improvement seen within 3 days of consistent use for reevaluation  Final Clinical Impressions(s) / UC Diagnoses   Final diagnoses:  Rash     Discharge Instructions      The cause of your rash is unknown however as you have attempted continuous times use of steroids we will provide bacterial coverage  Begin Keflex every morning and every evening for 5 days, this medicine is your antibiotic to cover for bacteria  Stop use of triamcinolone cream and begin use of clobetasol cream every morning and every  evening, this is your steroid to help reduce inflammation and to clear to clear rash  You may use topical Benadryl cream over the affected areas to help manage your itching as well as calamine lotion, apply your steroid cream first  Begin use of famotidine every morning and every evening for 7 days, typically this medicine is used for heartburn and indigestion but it is in the same family as Benadryl and will cover some of the histamine receptors and ideally help to minimize some of your symptoms  If you have not seen any improvement in your symptoms by Monday please return for reevaluation   ED Prescriptions     Medication Sig Dispense Auth. Provider   clobetasol ointment (TEMOVATE) 2.84 % Apply 1 Application topically 2 (two) times daily. 30 g Biridiana Twardowski R, NP   cephALEXin (KEFLEX) 500 MG capsule Take 1 capsule (500 mg total) by mouth 2 (two) times daily for  5 days. 10 capsule Emaley Applin R, NP   famotidine (PEPCID) 20 MG tablet Take 1 tablet (20 mg total) by mouth 2 (two) times daily for 7 days. 14 tablet Caileb Rhue, Leitha Schuller, NP      PDMP not reviewed this encounter.   Hans Eden, NP 05/18/22 1904

## 2022-05-18 NOTE — Telephone Encounter (Signed)
Patient called in via [employer]. States she requested refill of betamethasone cream because she developed a rash on arms and legs. States this did not relieve the rash so she went to UC and was placed on hydroxyzine and triamcinolone cream. "Together these have reduced the itching some." However, she is seeing "new patches that are fluid filled pop up." Also, notes these new meds are causing dry mouth and increase in migraines. Unfortunately, there are no openings in [facility] till 9/12. She is advised to go back to UC. States she will go today and keep appt on 9/12.

## 2022-05-18 NOTE — Discharge Instructions (Addendum)
The cause of your rash is unknown however as you have attempted continuous times use of steroids we will provide bacterial coverage  Begin Keflex every morning and every evening for 5 days, this medicine is your antibiotic to cover for bacteria  Stop use of triamcinolone cream and begin use of clobetasol cream every morning and every evening, this is your steroid to help reduce inflammation and to clear to clear rash  You may use topical Benadryl cream over the affected areas to help manage your itching as well as calamine lotion, apply your steroid cream first  Begin use of famotidine every morning and every evening for 7 days, typically this medicine is used for heartburn and indigestion but it is in the same family as Benadryl and will cover some of the histamine receptors and ideally help to minimize some of your symptoms  If you have not seen any improvement in your symptoms by Monday please return for reevaluation

## 2022-05-18 NOTE — ED Triage Notes (Signed)
Pt presents for follow up relating to a rash. States she was recently seen here on 8/30 and prescribed hydroxyzine and a cream. States believes the oral medication is causing migraines to increase. Called PCP this morning and they agreed that the oral medication may be the cause of the reaction.

## 2022-05-31 ENCOUNTER — Ambulatory Visit (INDEPENDENT_AMBULATORY_CARE_PROVIDER_SITE_OTHER): Payer: BC Managed Care – PPO

## 2022-05-31 ENCOUNTER — Other Ambulatory Visit (HOSPITAL_COMMUNITY)
Admission: RE | Admit: 2022-05-31 | Discharge: 2022-05-31 | Disposition: A | Discharge status: Home / Self Care | Payer: BC Managed Care – PPO | Source: Ambulatory Visit | Attending: Internal Medicine | Admitting: Internal Medicine

## 2022-05-31 VITALS — BP 134/78 | HR 75 | Temp 98.4°F | Ht 60.0 in | Wt 183.4 lb

## 2022-05-31 DIAGNOSIS — R21 Rash and other nonspecific skin eruption: Secondary | ICD-10-CM | POA: Insufficient documentation

## 2022-05-31 DIAGNOSIS — G43009 Migraine without aura, not intractable, without status migrainosus: Secondary | ICD-10-CM

## 2022-05-31 MED ORDER — DIPHENHYDRAMINE-ZINC ACETATE 2-0.1 % EX CREA: 1.0000 | TOPICAL_CREAM | Freq: Three times a day (TID) | CUTANEOUS | 0 refills | Status: DC | PRN

## 2022-05-31 MED ORDER — TRAMADOL HCL 50 MG PO TABS: ORAL_TABLET | ORAL | 0 refills | Status: DC

## 2022-05-31 NOTE — Patient Instructions (Addendum)
Donna Davis, it was a pleasure seeing you today!  Today we discussed: Rash: Please apply the benadryl cream as needed. We will call you back about lab results and biopsy results.   I have ordered the following labs today:  Lab Orders         CRP (C-Reactive Protein)         Sed Rate (ESR)         CMP14 + Anion Gap         Rocky mtn spotted fvr ab, IgG-blood         Lyme Disease Serology w/Reflex       Tests ordered today:  none  Referrals ordered today:   Referral Orders  No referral(s) requested today     I have ordered the following medication/changed the following medications:   Stop the following medications: There are no discontinued medications.   Start the following medications: Meds ordered this encounter  Medications   diphenhydrAMINE-zinc acetate (BENADRYL EXTRA STRENGTH) cream    Sig: Apply 1 Application topically 3 (three) times daily as needed for itching.    Dispense:  28.4 g    Refill:  0     Follow-up:  prn    Please make sure to arrive 15 minutes prior to your next appointment. If you arrive late, you may be asked to reschedule.   We look forward to seeing you next time. Please call our clinic at [phone] if you have any questions or concerns. The best time to call is Monday-Friday from 9am-4pm, but there is someone available 24/7. If after hours or the weekend, call the main hospital number and ask for the Internal Medicine Resident On-Call. If you need medication refills, please notify your pharmacy one week in advance and they will send [place] a request.  Thank you for letting us take part in your care. Wishing you the best!  Linward Natal, MD

## 2022-05-31 NOTE — Addendum Note (Signed)
Addended by: Gilles Chiquito B on: 05/31/2022 10:15 AM   Modules accepted: Orders

## 2022-05-31 NOTE — Assessment & Plan Note (Signed)
Patient presents with rash going on at least 6 weeks that is pruritic, painful (like being stung by bees).  She states she went camping in [place] but this was just after the rash began.  She has been seen multiple times for this and has escalated through topical steroids and multiple medications for for itching including hydroxyzine and famotidine.  She had some response to the topical steroids but this only helped with itching, as the rash continued.  She states that she stopped her steroids due to hallucinations.  She stopped the hydroxyzine because she felt it worsened her migraines.  The rash began distally and ascended over the last several weeks.  She is afebrile today.  She endorses intermittent subjective fevers that occur most days.  She has completed a course of Keflex.  Her her symptoms have continued to worsen, and her pain and discomfort prevents her from being able to walk or sleep.  Exam with scattered papules extending from ankles to thighs bilaterally with 1 larger plaque at anterior distal right lower extremity.  Unsure of etiology at this point.  Differential includes tickborne illness, lichen planus.  Patient denies polyarthralgia but does endorse right hip pain likely secondary to fall she has suffered in the setting of vestibular condition.  -Punch biopsy, will call with results -Topical Benadryl -CRP, ESR, CMP -RMSF titer -Refill tramadol -Lyme titer

## 2022-05-31 NOTE — Progress Notes (Signed)
CC: Rash  HPI:  [name] is a 51 y.o. with past medical history as below who presents with rash.  Past Medical History:  Diagnosis Date   Anxiety    Clostridium difficile infection    x several times   Deaf    uses sign language   Endometriosis    s/p TAH, LSO   Family history of malignant neoplasm of gastrointestinal tract    Gallstones    GERD (gastroesophageal reflux disease)    Hearing difficulty    b/l, 2/2 congenital rubella s/p stapedectomy. Using hearing aid   Hepatic cyst    6 mm on CT done done in 05/2005   Hypothyroidism    IBS (irritable bowel syndrome)    Migraines    "frequency depends on my stress level" (07/23/2017)   Obesity    PONV (postoperative nausea and vomiting)    "sometimes; depends on how long I'm under"   Shingles 03/2013   Review of Systems: See detailed assessment and plan for pertinent ROS.  Physical Exam:  Vitals:   05/31/22 0846  BP: 134/78  Pulse: 75  Temp: 98.4 F (36.9 C)  TempSrc: Oral  SpO2: 100%  Weight: 183 lb 6.4 oz (83.2 kg)  Height: 5' (1.524 m)   Physical Exam Constitutional:      General: She is not in acute distress. HENT:     Head: Normocephalic and atraumatic.  Eyes:     Extraocular Movements: Extraocular movements intact.  Pulmonary:     Effort: Pulmonary effort is normal.  Skin:    General: Skin is warm and dry.     Comments: Scattered papules extending from ankles proximally to thighs bilaterally with 1 larger 3 x 4 cm plaque at anterior distal right lower extremity.  Lesions are raised, peripherally blanching, and erythematous with central scarring.  Neurological:     General: No focal deficit present.     Mental Status: She is alert and oriented to person, place, and time.  Psychiatric:        Mood and Affect: Mood normal.      Assessment & Plan:   See Encounters Tab for problem based charting.  Skin rash Patient presents with rash going on at least 6 weeks that is pruritic, painful  (like being stung by bees).  She states she went camping in [place] but this was just after the rash began.  She has been seen multiple times for this and has escalated through topical steroids and multiple medications for for itching including hydroxyzine and famotidine.  She had some response to the topical steroids but this only helped with itching, as the rash continued.  She states that she stopped her steroids due to hallucinations.  She stopped the hydroxyzine because she felt it worsened her migraines.  The rash began distally and ascended over the last several weeks.  She is afebrile today.  She endorses intermittent subjective fevers that occur most days.  She has completed a course of Keflex.  Her her symptoms have continued to worsen, and her pain and discomfort prevents her from being able to walk or sleep.  Exam with scattered papules extending from ankles to thighs bilaterally with 1 larger plaque at anterior distal right lower extremity.  Unsure of etiology at this point.  Differential includes tickborne illness, lichen planus.  Patient denies polyarthralgia but does endorse right hip pain likely secondary to fall she has suffered in the setting of vestibular condition.  -Punch biopsy, will call  with results -Topical Benadryl -CRP, ESR, CMP -RMSF titer -Refill tramadol -Lyme titer    Patient seen with Dr. Daryll Drown

## 2022-06-01 LAB — LYME DISEASE SEROLOGY W/REFLEX: Lyme Total Antibody EIA: NEGATIVE

## 2022-06-01 LAB — SURGICAL PATHOLOGY

## 2022-06-02 LAB — CMP14 + ANION GAP
ALT: 11 IU/L (ref 0–32)
AST: 14 IU/L (ref 0–40)
Albumin/Globulin Ratio: 1.7 (ref 1.2–2.2)
Albumin: 4.5 g/dL (ref 3.8–4.9)
Alkaline Phosphatase: 66 IU/L (ref 44–121)
Anion Gap: 17 mmol/L (ref 10.0–18.0)
BUN/Creatinine Ratio: 16 (ref 9–23)
BUN: 11 mg/dL (ref 6–24)
Bilirubin Total: <0.2 mg/dL (ref 0.0–1.2)
CO2: 23 mmol/L (ref 20–29)
Calcium: 9.6 mg/dL (ref 8.7–10.2)
Chloride: 103 mmol/L (ref 96–106)
Creatinine, Ser: 0.68 mg/dL (ref 0.57–1.00)
Globulin, Total: 2.7 g/dL (ref 1.5–4.5)
Glucose: 89 mg/dL (ref 70–99)
Potassium: 4.6 mmol/L (ref 3.5–5.2)
Sodium: 143 mmol/L (ref 134–144)
Total Protein: 7.2 g/dL (ref 6.0–8.5)
eGFR: 104 mL/min/{1.73_m2} (ref >59)

## 2022-06-02 LAB — C-REACTIVE PROTEIN: CRP: 12 mg/L — ABNORMAL HIGH (ref 0–10)

## 2022-06-02 LAB — SEDIMENTATION RATE: Sed Rate: 8 mm/hr (ref 0–40)

## 2022-06-02 LAB — ROCKY MTN SPOTTED FVR AB, IGG-BLOOD: RMSF IgG: NEGATIVE

## 2022-06-05 NOTE — Addendum Note (Signed)
Addended by: Linward Natal on: 06/05/2022 01:24 PM   Modules accepted: Orders

## 2022-06-06 ENCOUNTER — Other Ambulatory Visit: Payer: Self-pay | Admitting: Internal Medicine

## 2022-06-07 MED ORDER — ALPRAZOLAM 0.25 MG PO TABS: 0.2500 mg | ORAL_TABLET | Freq: Every evening | ORAL | 3 refills | Status: DC | PRN

## 2022-06-07 NOTE — Progress Notes (Signed)
Internal Medicine Clinic Attending  Case discussed with Dr. Dema Severin  at the time of the visit.  We reviewed the resident's history and exam and pertinent patient test results.  I agree with the assessment, diagnosis, and plan of care documented in the resident's note.   After informed written consent was obtained, using Betadine for cleansing and 1% Lidocaine without epinephrine for anesthetic, with sterile technique a 4 mm punch biopsy was used to obtain a biopsy specimen of the lesion. Hemostasis was obtained by pressure and wound was not sutured. Dressing is applied, and wound care instructions provided. Be alert for any signs of cutaneous infection. The specimen is labeled and sent to pathology for evaluation. The procedure was well tolerated without complications.

## 2022-06-26 ENCOUNTER — Other Ambulatory Visit: Payer: Self-pay | Admitting: Internal Medicine

## 2022-06-26 DIAGNOSIS — G43009 Migraine without aura, not intractable, without status migrainosus: Secondary | ICD-10-CM

## 2022-06-26 MED ORDER — TRAMADOL HCL 50 MG PO TABS: ORAL_TABLET | ORAL | 0 refills | Status: DC

## 2022-06-27 ENCOUNTER — Telehealth: Payer: Self-pay

## 2022-06-27 NOTE — Telephone Encounter (Signed)
Prior Authorization for patient (Tramadol) came through on cover my meds was submitted with last office notes awaiting approval or denial.

## 2022-06-28 NOTE — Telephone Encounter (Signed)
Decision:Approved Dorita Iannelli KeyBillie Ruddy - PA Case ID: [id number] - Rx #: [id number] Need help? Call us at [phone] Outcome Approvedon October 11 Your PA request has been approved. Additional information will be provided in the approval communication. (Message 1145) Drug traMADol HCl '50MG'$  tablets Form [employer] Electronic PA Form (2017 NCPDP) Original Claim Info 75 MAX 7 DS PER90 DAYS THEN PA. PA REQ [phone] REQUIRES PRIOR AUTHORIZATION

## 2022-06-30 ENCOUNTER — Other Ambulatory Visit: Payer: Self-pay | Admitting: Internal Medicine

## 2022-07-01 ENCOUNTER — Encounter: Payer: Self-pay | Admitting: Internal Medicine

## 2022-07-01 DIAGNOSIS — N951 Menopausal and female climacteric states: Secondary | ICD-10-CM

## 2022-07-02 ENCOUNTER — Other Ambulatory Visit: Payer: Self-pay | Admitting: *Deleted

## 2022-07-02 MED ORDER — BUSPIRONE HCL 10 MG PO TABS: 20.0000 mg | ORAL_TABLET | Freq: Three times a day (TID) | ORAL | 3 refills | Status: DC

## 2022-07-02 MED ORDER — ESTRADIOL 1 MG PO TABS: 1.0000 mg | ORAL_TABLET | Freq: Every day | ORAL | 3 refills | Status: DC

## 2022-07-04 ENCOUNTER — Telehealth: Payer: Self-pay | Admitting: *Deleted

## 2022-07-04 ENCOUNTER — Encounter: Payer: Self-pay | Admitting: Student in an Organized Health Care Education/Training Program

## 2022-07-04 MED ORDER — PAXLOVID (300/100) 20 X 150 MG & 10 X 100MG PO TBPK
3.0000 | ORAL_TABLET | Freq: Two times a day (BID) | ORAL | 0 refills | Status: DC
Start: 2022-07-04 — End: 2022-09-08

## 2022-07-04 NOTE — Telephone Encounter (Addendum)
Patient called in via [place]. States yesterday she developed fever, cough, productive of green/brown sputum (quit smoking 8 months ago), diarrhea, muscle aches, HA. Denies SHOB. States several coworkers with [employer]. Today she took a Covid home test and it is positive. There are no openings in [facility] today or tomorrow. Please advise if she is able to receive tx w/o being seen.   Of note, patient had previous Covid infection and Covid test remained positive for months with sx. Please see MyChart message from 05/24/21

## 2022-07-04 NOTE — Telephone Encounter (Signed)
Letter completed. Can you ensure she has access to it through mychart?

## 2022-07-04 NOTE — Telephone Encounter (Deleted)
Patient called in via [place]. States yesterday she developed fever, cough, productive of green/brown sputum (quit smoking 8 months ago), diarrhea, muscle aches, HA. Denies SHOB. States several coworkers with [employer]. Today she took a Covid home test and it is possible. There are no openings in [facility] today or tomorrow. Please advise if she is able to receive tx w/o being seen.  Of note, patient had previous Covid infection and Covid test remained positive for months with sx. Please see MyChart message from 05/24/21

## 2022-07-04 NOTE — Telephone Encounter (Addendum)
Patient called back via [place] [phone]. Relayed info below. She is aware that she must quarantine for 5 days from onset of sx and wear high quality mask for additional 5 days. Advised good handwashing, rest, and plenty of fluids, especially water.  She is requesting letter via MyChart to stay out of work till 07/09/22.

## 2022-07-04 NOTE — Telephone Encounter (Signed)
Attempted to relay info below via [place]. No answer. Left message on VM requesting return call.

## 2022-07-04 NOTE — Telephone Encounter (Signed)
Thank you, Dr. Evette Doffing. Patient does have access to MyChart.

## 2022-07-04 NOTE — Telephone Encounter (Signed)
Ok. Thank you for letting us know. With covid related symptoms for one day and new covid positive test, will presume this is symptomatic covid. Patient at higher than average risk given age and obesity. I will prescribe Paxlovid, take 3 tablets twice daily for 5 days. No significant medication interactions identified.

## 2022-07-08 ENCOUNTER — Encounter: Payer: Self-pay | Admitting: Internal Medicine

## 2022-07-09 NOTE — Telephone Encounter (Signed)
Pt called again  about covid meds and the cough   .Marland Kitchen Previous message was routed to PCP  explained that the message was received  and was routed  by RN lauren  to Dr Daryll Drown  .Marland Kitchen We are still awaiting Dr Daryll Drown to reply and she more than likely will reply through my chart back to patient

## 2022-07-10 ENCOUNTER — Encounter: Payer: Self-pay | Admitting: Student

## 2022-07-10 ENCOUNTER — Ambulatory Visit (INDEPENDENT_AMBULATORY_CARE_PROVIDER_SITE_OTHER): Payer: BC Managed Care – PPO | Admitting: Student

## 2022-07-10 VITALS — BP 140/90 | HR 90 | Temp 98.7°F | Ht 60.0 in

## 2022-07-10 DIAGNOSIS — U071 COVID-19: Secondary | ICD-10-CM | POA: Insufficient documentation

## 2022-07-10 MED ORDER — CHERATUSSIN AC 100-10 MG/5ML PO SOLN: 5.0000 mL | Freq: Three times a day (TID) | ORAL | 0 refills | Status: DC | PRN

## 2022-07-10 MED ORDER — FLUTICASONE PROPIONATE 50 MCG/ACT NA SUSP: 1.0000 | Freq: Every day | NASAL | 2 refills | Status: DC

## 2022-07-10 MED ORDER — LOPERAMIDE HCL 2 MG PO TABS: 2.0000 mg | ORAL_TABLET | Freq: Four times a day (QID) | ORAL | 0 refills | Status: DC | PRN

## 2022-07-10 NOTE — Assessment & Plan Note (Addendum)
Patient presents with worsening cough for past 3-4 days. She tested positive for COVID-19 a week ago, 10/17.  Was started on Paxlovid but stopped after day 3 due to projectile vomiting. She felt better for a day then the cough worsened. Had a fever of 102 last week. Endorses chills, myalgias, nausea, vomiting and diarrhea. States at least 5-6 episodes of non-bloody diarrhea. Notes that excessive coughing causes some stool incontinence. History of prior COVID infections. Today she is afebrile with good O2 sats. Discussed with patient that cough may persist for a few weeks and recommended supportive care. Return precautions given.   Plan -supportive care with Flonase, Cheratussin and Imodium -work note provided for this week -discussed if coughing persists for weeks or worsens to return to clinic

## 2022-07-10 NOTE — Patient Instructions (Addendum)
Thank you, [name] for allowing [place] to provide your care today. Today we discussed your COVID symptoms.  -I have sent prescription for Flonase, Cheratussin and Imodium to your pharmacy. These medications should help with your cough and diarrhea. -You may also use a nasal irrigation/Neti pot to help with sinus congestion.  -The cough from COVID can last a few weeks. If the cough persists for more than 2 weeks or you have worsening symptoms like fever or shortness of breath, please come back to the clinic.    I have ordered the following medication/changed the following medications:   Stop the following medications: There are no discontinued medications.   Start the following medications: No orders of the defined types were placed in this encounter.    Follow up:  1-2 months    Should you have any questions or concerns please call the internal medicine clinic at [phone].    Angelique Blonder, D.O. [place]

## 2022-07-10 NOTE — Progress Notes (Signed)
CC: COVID-19  HPI:  Donna Davis is a 51 y.o. female living with a history stated below and presents today for COVID-19. Please see problem based assessment and plan for additional details.  In-person sign language interpreter present during visit.   Past Medical History:  Diagnosis Date   Anxiety    Clostridium difficile infection    x several times   Deaf    uses sign language   Endometriosis    s/p TAH, LSO   Family history of malignant neoplasm of gastrointestinal tract    Gallstones    GERD (gastroesophageal reflux disease)    Hearing difficulty    b/l, 2/2 congenital rubella s/p stapedectomy. Using hearing aid   Hepatic cyst    6 mm on CT done done in 05/2005   Hypothyroidism    IBS (irritable bowel syndrome)    Migraines    "frequency depends on my stress level" (07/23/2017)   Obesity    PONV (postoperative nausea and vomiting)    "sometimes; depends on how long I'm under"   Shingles 03/2013    Current Outpatient Medications on File Prior to Visit  Medication Sig Dispense Refill   ALPRAZolam (XANAX) 0.25 MG tablet Take 1 tablet (0.25 mg total) by mouth at bedtime as needed for anxiety. 30 tablet 3   Ascorbic Acid (VITAMIN C PO) Take 1 tablet by mouth daily.     betamethasone valerate (VALISONE) 0.1 % cream APPLY 1 APPLICATION ON THE SKIN TWICE DAILY AS NEEDED 30 g 2   busPIRone (BUSPAR) 10 MG tablet Take 2 tablets (20 mg total) by mouth 3 (three) times daily. 180 tablet 3   Calcium Carbonate (CALCIUM 600 PO) Take 1 tablet by mouth daily.     clobetasol ointment (TEMOVATE) 2.95 % Apply 1 Application topically 2 (two) times daily. 30 g 0   diphenhydrAMINE-zinc acetate (BENADRYL EXTRA STRENGTH) cream Apply 1 Application topically 3 (three) times daily as needed for itching. 28.4 g 0   estradiol (ESTRACE) 1 MG tablet Take 1 tablet (1 mg total) by mouth daily. 90 tablet 3   famotidine (PEPCID) 20 MG tablet Take 1 tablet (20 mg total) by mouth 2 (two) times daily for  7 days. 14 tablet 0   fluconazole (DIFLUCAN) 150 MG tablet Take by mouth once and then again in 72 hours if no improvement in symptoms. 2 tablet 0   hydrOXYzine (ATARAX) 25 MG tablet Take 1 tablet (25 mg total) by mouth every 6 (six) hours. 12 tablet 0   Krill Oil (OMEGA-3) 500 MG CAPS Take 1 capsule by mouth daily.     levothyroxine (EUTHYROX) 25 MCG tablet Take 1 tablet (25 mcg total) by mouth daily. 90 tablet 3   meclizine (ANTIVERT) 50 MG tablet Take 1 tablet (50 mg total) by mouth 3 (three) times daily as needed. 30 tablet 0   methocarbamol (ROBAXIN) 500 MG tablet Take 1,000 mg by mouth 4 (four) times daily.     nirmatrelvir & ritonavir (PAXLOVID, 300/100,) 20 x 150 MG & 10 x '100MG'$  TBPK Take 3 tablets by mouth in the morning and at bedtime. 30 tablet 0   omeprazole (PRILOSEC) 20 MG capsule TAKE 1 CAPSULE BY MOUTH TWICE DAILY BEFORE A MEAL OR FOOD. 180 capsule 3   predniSONE (STERAPRED UNI-PAK 21 TAB) 10 MG (21) TBPK tablet Take as directed until finished 1 each 0   promethazine (PHENERGAN) 12.5 MG tablet Take 1 tablet (12.5 mg total) by mouth every 6 (six) hours  as needed for nausea or vomiting. 30 tablet 0   traMADol (ULTRAM) 50 MG tablet TAKE 1 TABLET BY MOUTH UP TO EVERY 6HRS AS NEEDED FOR MODERATE PAIN*UP TO 30 DOSES 30 tablet 0   triamcinolone ointment (KENALOG) 0.5 % Apply 1 Application topically 2 (two) times daily. 30 g 0   VITAMIN D PO One daily     No current facility-administered medications on file prior to visit.    Review of Systems: ROS negative except for what is noted on the assessment and plan.  Vitals:   07/10/22 1352  BP: (!) 140/90  Pulse: 90  Temp: 98.7 F (37.1 C)  TempSrc: Oral  SpO2: 97%  Height: 5' (1.524 m)    Physical Exam: Constitutional: alert, in no acute distress HENT: normocephalic atraumatic, mucous membranes moist, no lesions on palate, no exudate on tonsils Neck: supple Cardiovascular: regular rate and rhythm, no m/r/g Pulmonary/Chest:  normal work of breathing on room air, faint fine crackles diffusely Abdominal: soft, non-tender, non-distended MSK: normal bulk and tone Neurological: alert & oriented x 3 Skin: warm and dry Psych: normal mood and behavior  Assessment & Plan:   COVID Patient presents with worsening cough for past 3-4 days. She tested positive for COVID-19 a week ago, 10/17.  Was started on Paxlovid but stopped after day 3 due to projectile vomiting. She felt better for a day then the cough worsened. Had a fever of 102 last week. Endorses chills, myalgias, nausea, vomiting and diarrhea. States at least 5-6 episodes of non-bloody diarrhea. Notes that excessive coughing causes some stool incontinence. History of prior COVID infections. Today she is afebrile with good O2 sats. Discussed with patient that cough may persist for a few weeks and recommended supportive care. Return precautions given.   Plan -supportive care with Flonase, Cheratussin and Imodium -work note provided for this week -discussed if coughing persists for weeks or worsens to return to clinic   Patient seen with Dr. Alvin Critchley, D.O. [facility], PGY-1 Phone: [phone] Date 07/10/2022 Time 5:59 PM

## 2022-07-12 NOTE — Progress Notes (Signed)
Internal Medicine Clinic Attending  I saw and evaluated the patient.  I personally confirmed the key portions of the history and exam documented by Dr. Zheng and I reviewed pertinent patient test results.  The assessment, diagnosis, and plan were formulated together and I agree with the documentation in the resident's note.  

## 2022-07-25 ENCOUNTER — Other Ambulatory Visit: Payer: Self-pay | Admitting: Internal Medicine

## 2022-07-25 DIAGNOSIS — G43009 Migraine without aura, not intractable, without status migrainosus: Secondary | ICD-10-CM

## 2022-07-26 ENCOUNTER — Other Ambulatory Visit: Payer: Self-pay | Admitting: Internal Medicine

## 2022-07-26 MED ORDER — TRAMADOL HCL 50 MG PO TABS: ORAL_TABLET | ORAL | 0 refills | Status: DC

## 2022-08-22 ENCOUNTER — Other Ambulatory Visit: Payer: Self-pay | Admitting: Internal Medicine

## 2022-08-22 DIAGNOSIS — G43009 Migraine without aura, not intractable, without status migrainosus: Secondary | ICD-10-CM

## 2022-08-22 DIAGNOSIS — R112 Nausea with vomiting, unspecified: Secondary | ICD-10-CM

## 2022-08-22 MED ORDER — TRAMADOL HCL 50 MG PO TABS: ORAL_TABLET | ORAL | 0 refills | Status: DC

## 2022-08-22 MED ORDER — PROMETHAZINE HCL 12.5 MG PO TABS: 12.5000 mg | ORAL_TABLET | Freq: Four times a day (QID) | ORAL | 0 refills | Status: DC | PRN

## 2022-08-31 ENCOUNTER — Other Ambulatory Visit: Payer: Self-pay | Admitting: Internal Medicine

## 2022-09-07 ENCOUNTER — Ambulatory Visit: Admit: 2022-09-07 | Payer: BC Managed Care – PPO

## 2022-09-08 ENCOUNTER — Telehealth: Payer: BC Managed Care – PPO | Admitting: Physician Assistant

## 2022-09-08 DIAGNOSIS — A084 Viral intestinal infection, unspecified: Secondary | ICD-10-CM | POA: Diagnosis not present

## 2022-09-08 MED ORDER — DICYCLOMINE HCL 10 MG PO CAPS: 10.0000 mg | ORAL_CAPSULE | Freq: Three times a day (TID) | ORAL | 0 refills | Status: DC

## 2022-09-08 MED ORDER — SUCRALFATE 1 G PO TABS: 1.0000 g | ORAL_TABLET | Freq: Three times a day (TID) | ORAL | 0 refills | Status: DC

## 2022-09-08 NOTE — Progress Notes (Signed)
Virtual Visit Consent   Donna Davis, you are scheduled for a virtual visit with a [facility] provider today. Just as with appointments in the office, your consent must be obtained to participate. Your consent will be active for this visit and any virtual visit you may have with one of our providers in the next 365 days. If you have a MyChart account, a copy of this consent can be sent to you electronically.  As this is a virtual visit, video technology does not allow for your provider to perform a traditional examination. This may limit your provider's ability to fully assess your condition. If your provider identifies any concerns that need to be evaluated in person or the need to arrange testing (such as labs, EKG, etc.), we will make arrangements to do so. Although advances in technology are sophisticated, we cannot ensure that it will always work on either your end or our end. If the connection with a video visit is poor, the visit may have to be switched to a telephone visit. With either a video or telephone visit, we are not always able to ensure that we have a secure connection.  By engaging in this virtual visit, you consent to the provision of healthcare and authorize for your insurance to be billed (if applicable) for the services provided during this visit. Depending on your insurance coverage, you may receive a charge related to this service.  I need to obtain your verbal consent now. Are you willing to proceed with your visit today? Donna Davis has provided verbal consent on 09/08/2022 for a virtual visit (video or telephone). Mar Daring, PA-C  Date: 09/08/2022 11:43 AM  Virtual Visit via Video Note   I, Mar Daring, connected with  Donna Davis  ([id number], 12-03-1970) on 09/08/22 at 11:30 AM EST by a video-enabled telemedicine application and verified that I am speaking with the correct person using two identifiers.  Location: Patient: Virtual Visit Location  Patient: Home Provider: Virtual Visit Location Provider: Home Office   I discussed the limitations of evaluation and management by telemedicine and the availability of in person appointments. The patient expressed understanding and agreed to proceed.    History of Present Illness: Donna Davis is a 51 y.o. who identifies as a female who was assigned female at birth, and is being seen today for diarrhea.  HPI: Diarrhea  This is a new problem. The current episode started in the past 7 days. The problem occurs 5 to 10 times per day. The problem has been gradually worsening. Associated symptoms include arthralgias, bloating, chills, a fever, headaches, increased flatus, myalgias and sweats. Pertinent negatives include no vomiting. Nothing aggravates the symptoms. Risk factors include ill contacts. Treatments tried: tylenol, advil, antacids, phenergan.    Norovirus exposure at work   Problems:  Patient Active Problem List   Diagnosis Date Noted   COVID 07/10/2022   Right middle ear infection 01/22/2022   Elevated BP without diagnosis of hypertension 07/17/2021   Vaginal irritation 07/17/2021   COVID-19 long hauler manifesting chronic fatigue 05/16/2021   Arthralgia of right temporomandibular joint 08/26/2018   Endometriosis 04/09/2018   Hip pain, acute, right 04/09/2018   Skin rash 04/04/2017   Allergic rhinitis due to allergen 12/19/2016   Cochlear implant in place 12/05/2016   Bilateral deafness 01/17/2016   Family history of breast cancer in mother 08/26/2015   Obesity (BMI 30-39.9) 02/18/2014   Amitriptyline adverse reaction 04/08/2013   Migraine headache 12/02/2012  Routine adult health maintenance 09/01/2012   Anxiety state 09/03/2006   Hypothyroidism 07/01/2006   GERD 07/01/2006   IBS 07/01/2006    Allergies:  Allergies  Allergen Reactions   Onion Anaphylaxis   Other Other (See Comments)    [place] Prep - drop in BP Steroids Went crazy   Shellfish Allergy Anaphylaxis  and Swelling   Amitriptyline Anxiety, Palpitations and Other (See Comments)    hallucinations   Amoxicillin-Pot Clavulanate Nausea And Vomiting    With high dose, low dose ok   Amoxicillin-Pot Clavulanate Nausea And Vomiting    With high dose, low dose ok   Cefuroxime Nausea And Vomiting   Cephalexin Other (See Comments)     gi upset  gi upset   Ciprofloxacin Other (See Comments)     Fevers   Clarithromycin Nausea And Vomiting and Other (See Comments)    Pt is OK with azithromycin  gi upset   Clarithromycin Nausea And Vomiting    Pt is OK with azithromycin   Doxycycline Other (See Comments) and Hives     gi upset  gi upset   Propranolol Other (See Comments)    Fatigue and makes her "feel crazy", refuses to take.   Sulfonamide Derivatives Other (See Comments)    vaginal blisters   Sulfa Antibiotics Other (See Comments)    Rash, vaginal blisters   Sulfasalazine Itching    Rash, vaginal blisters Rash, vaginal blisters    Benadryl [Diphenhydramine Hcl (Sleep)] Anxiety    Extreme agitation   Shrimp Flavor Other (See Comments)    unknown   Medications:  Current Outpatient Medications:    dicyclomine (BENTYL) 10 MG capsule, Take 1 capsule (10 mg total) by mouth 4 (four) times Davis -  before meals and at bedtime., Disp: 40 capsule, Rfl: 0   sucralfate (CARAFATE) 1 g tablet, Take 1 tablet (1 g total) by mouth 4 (four) times Davis -  with meals and at bedtime., Disp: 40 tablet, Rfl: 0   ALPRAZolam (XANAX) 0.25 MG tablet, Take 1 tablet (0.25 mg total) by mouth at bedtime as needed for anxiety., Disp: 30 tablet, Rfl: 3   Ascorbic Acid (VITAMIN C PO), Take 1 tablet by mouth Davis., Disp: , Rfl:    busPIRone (BUSPAR) 10 MG tablet, TAKE 2 TABLETS BY MOUTH 3 TIMES Davis., Disp: 540 tablet, Rfl: 2   Calcium Carbonate (CALCIUM 600 PO), Take 1 tablet by mouth Davis., Disp: , Rfl:    estradiol (ESTRACE) 1 MG tablet, Take 1 tablet (1 mg total) by mouth Davis., Disp: 90 tablet, Rfl: 3    famotidine (PEPCID) 20 MG tablet, Take 1 tablet (20 mg total) by mouth 2 (two) times Davis for 7 days., Disp: 14 tablet, Rfl: 0   fluconazole (DIFLUCAN) 150 MG tablet, Take by mouth once and then again in 72 hours if no improvement in symptoms., Disp: 2 tablet, Rfl: 0   fluticasone (FLONASE) 50 MCG/ACT nasal spray, Place 1 spray into both nostrils Davis., Disp: 10 mL, Rfl: 2   Krill Oil (OMEGA-3) 500 MG CAPS, Take 1 capsule by mouth Davis., Disp: , Rfl:    levothyroxine (EUTHYROX) 25 MCG tablet, Take 1 tablet (25 mcg total) by mouth Davis., Disp: 90 tablet, Rfl: 3   loperamide (IMODIUM A-D) 2 MG tablet, Take 1 tablet (2 mg total) by mouth 4 (four) times Davis as needed for diarrhea or loose stools., Disp: 30 tablet, Rfl: 0   meclizine (ANTIVERT) 50 MG tablet, Take 1 tablet (50 mg total)  by mouth 3 (three) times Davis as needed., Disp: 30 tablet, Rfl: 0   omeprazole (PRILOSEC) 20 MG capsule, TAKE 1 CAPSULE BY MOUTH TWICE Davis BEFORE A MEAL OR FOOD., Disp: 180 capsule, Rfl: 3   promethazine (PHENERGAN) 12.5 MG tablet, Take 1 tablet (12.5 mg total) by mouth every 6 (six) hours as needed for nausea or vomiting., Disp: 30 tablet, Rfl: 0   traMADol (ULTRAM) 50 MG tablet, TAKE 1 TABLET BY MOUTH UP TO EVERY 6HRS AS NEEDED FOR MODERATE PAIN*UP TO 30 DOSES, Disp: 30 tablet, Rfl: 0   VITAMIN D PO, One Davis, Disp: , Rfl:   Observations/Objective: Patient is well-developed, well-nourished in no acute distress.  Resting comfortably at home.  Head is normocephalic, atraumatic.  No labored breathing.  Speech is clear and coherent with logical content.  Patient is alert and oriented at baseline.    Assessment and Plan: 1. Viral gastroenteritis - sucralfate (CARAFATE) 1 g tablet; Take 1 tablet (1 g total) by mouth 4 (four) times Davis -  with meals and at bedtime.  Dispense: 40 tablet; Refill: 0 - dicyclomine (BENTYL) 10 MG capsule; Take 1 capsule (10 mg total) by mouth 4 (four) times Davis -  before meals  and at bedtime.  Dispense: 40 capsule; Refill: 0  - Suspect viral gastroenteritis - Continue Phenergan (home supply) for nausea and vomiting - Imodium for diarrhea - Sucralfate for Gastritis symptoms - Bentyl for cramping and bloating - Push fluids, electrolyte beverages - Liquid diet, then increase to soft/bland (BRAT) diet over next day, then increase diet as tolerated - Seek in person evaluation if not improving or symptoms worsen   Follow Up Instructions: I discussed the assessment and treatment plan with the patient. The patient was provided an opportunity to ask questions and all were answered. The patient agreed with the plan and demonstrated an understanding of the instructions.  A copy of instructions were sent to the patient via MyChart unless otherwise noted below.    The patient was advised to call back or seek an in-person evaluation if the symptoms worsen or if the condition fails to improve as anticipated.  Time:  I spent 15 minutes with the patient via telehealth technology discussing the above problems/concerns.    Mar Daring, PA-C

## 2022-09-08 NOTE — Patient Instructions (Signed)
Donna Davis, thank you for joining Mar Daring, PA-C for today's virtual visit.  While this provider is not your primary care provider (PCP), if your PCP is located in our provider database this encounter information will be shared with them immediately following your visit.   [facility] account gives you access to today's visit and all your visits, tests, and labs performed at [facility] " click here if you don't have a [facility] account or go to [email or web address]  Consent: (Patient) Donna Davis provided verbal consent for this virtual visit at the beginning of the encounter.  Current Medications:  Current Outpatient Medications:    dicyclomine (BENTYL) 10 MG capsule, Take 1 capsule (10 mg total) by mouth 4 (four) times daily -  before meals and at bedtime., Disp: 40 capsule, Rfl: 0   sucralfate (CARAFATE) 1 g tablet, Take 1 tablet (1 g total) by mouth 4 (four) times daily -  with meals and at bedtime., Disp: 40 tablet, Rfl: 0   ALPRAZolam (XANAX) 0.25 MG tablet, Take 1 tablet (0.25 mg total) by mouth at bedtime as needed for anxiety., Disp: 30 tablet, Rfl: 3   Ascorbic Acid (VITAMIN C PO), Take 1 tablet by mouth daily., Disp: , Rfl:    busPIRone (BUSPAR) 10 MG tablet, TAKE 2 TABLETS BY MOUTH 3 TIMES DAILY., Disp: 540 tablet, Rfl: 2   Calcium Carbonate (CALCIUM 600 PO), Take 1 tablet by mouth daily., Disp: , Rfl:    estradiol (ESTRACE) 1 MG tablet, Take 1 tablet (1 mg total) by mouth daily., Disp: 90 tablet, Rfl: 3   famotidine (PEPCID) 20 MG tablet, Take 1 tablet (20 mg total) by mouth 2 (two) times daily for 7 days., Disp: 14 tablet, Rfl: 0   fluconazole (DIFLUCAN) 150 MG tablet, Take by mouth once and then again in 72 hours if no improvement in symptoms., Disp: 2 tablet, Rfl: 0   fluticasone (FLONASE) 50 MCG/ACT nasal spray, Place 1 spray into both nostrils daily., Disp: 10 mL, Rfl: 2   Krill Oil (OMEGA-3) 500 MG CAPS, Take 1 capsule by  mouth daily., Disp: , Rfl:    levothyroxine (EUTHYROX) 25 MCG tablet, Take 1 tablet (25 mcg total) by mouth daily., Disp: 90 tablet, Rfl: 3   loperamide (IMODIUM A-D) 2 MG tablet, Take 1 tablet (2 mg total) by mouth 4 (four) times daily as needed for diarrhea or loose stools., Disp: 30 tablet, Rfl: 0   meclizine (ANTIVERT) 50 MG tablet, Take 1 tablet (50 mg total) by mouth 3 (three) times daily as needed., Disp: 30 tablet, Rfl: 0   omeprazole (PRILOSEC) 20 MG capsule, TAKE 1 CAPSULE BY MOUTH TWICE DAILY BEFORE A MEAL OR FOOD., Disp: 180 capsule, Rfl: 3   promethazine (PHENERGAN) 12.5 MG tablet, Take 1 tablet (12.5 mg total) by mouth every 6 (six) hours as needed for nausea or vomiting., Disp: 30 tablet, Rfl: 0   traMADol (ULTRAM) 50 MG tablet, TAKE 1 TABLET BY MOUTH UP TO EVERY 6HRS AS NEEDED FOR MODERATE PAIN*UP TO 30 DOSES, Disp: 30 tablet, Rfl: 0   VITAMIN D PO, One daily, Disp: , Rfl:    Medications ordered in this encounter:  Meds ordered this encounter  Medications   sucralfate (CARAFATE) 1 g tablet    Sig: Take 1 tablet (1 g total) by mouth 4 (four) times daily -  with meals and at bedtime.    Dispense:  40 tablet    Refill:  0  Order Specific Question:   Supervising Provider    Answer:   Chase Picket [[id number]]   dicyclomine (BENTYL) 10 MG capsule    Sig: Take 1 capsule (10 mg total) by mouth 4 (four) times daily -  before meals and at bedtime.    Dispense:  40 capsule    Refill:  0    Order Specific Question:   Supervising Provider    Answer:   Chase Picket [id number]     *If you need refills on other medications prior to your next appointment, please contact your pharmacy*  Follow-Up: Call back or seek an in-person evaluation if the symptoms worsen or if the condition fails to improve as anticipated.  [facility] [phone]  Other Instructions  Norovirus Infection Norovirus infection causes inflammation in the stomach and intestines  (gastroenteritis) and food poisoning. It is caused by exposure to a virus from a group of similar viruses called noroviruses. Norovirus spreads very easily from person to person (is very contagious). It often occurs in places where people are in close contact, such as schools, nursing homes, restaurants, and cruise ships. You can get it from food, water, surfaces, or other people who have the virus. Norovirus is also found in the stool (feces) or vomit of infected people. You can spread the infection as soon as you feel sick, and you may continue to be contagious after you recover. What are the causes? This condition is caused by contact with norovirus. You can catch norovirus if you: Eat or drink something that is contaminated with norovirus. Touch surfaces or objects that are contaminated with norovirus and then put your hand in or by your mouth or nose. Have direct contact with an infected person who may or may not still have symptoms. Share food, drink, or utensils with someone who is contagious with norovirus. What are the signs or symptoms? Symptoms usually begin within 12 hours to 2 days after you become infected. Most norovirus symptoms affect the digestive system.Symptoms may include: Nausea, vomiting, and diarrhea. Stomach cramps. Fever. Chills. Headache. Muscle aches and tiredness. How is this diagnosed? This condition may be diagnosed based on: Your symptoms. A physical exam. A stool test. How is this treated? There is no specific treatment for norovirus. Most people get better without treatment in about 2 days. Young children, the elderly, and people who are already sick may take up to 6 days to recover. Follow these instructions at home:  Eating and drinking  Drink plenty of water to replace fluids that are lost through diarrhea and vomiting. This prevents dehydration. Drink enough fluid to keep your urine pale yellow. Drink clear fluids in small amounts as you are able.  Clear fluids include water, ice chips, fruit juice with water added (diluted fruit juice), and low-calorie sports drinks. Avoid fluids that contain a lot of sugar or caffeine, such as energy drinks, sports drinks, and soda. Avoid alcohol. If instructed by your health care provider, drink an oral rehydration solution (ORS). This is a drink that is sold at pharmacies and retail stores. An ORS contains minerals (electrolytes) that you can lose through diarrhea and vomiting. Eat bland, easy-to-digest foods in small amounts as you are able. These foods include rice, lean meats, toast, and crackers. Avoid spicy or fatty foods. General instructions Rest at home while you recover. Do not prepare food for others while you are infected. Wait at least 3 days after you recover from the illness to do this. Take  over-the-counter and prescription medicines only as told by your health care provider. Wash your hands frequently with soap and water for at least 20 seconds. Alcohol-based hand sanitizer can be used in addition to soap and water, but sanitizer should not be the only cleansing method because it is not effective at removing norovirus from your hands or surfaces. Make sure that each person in your household washes his or her hands well and often. Keep all follow-up visits. This is important. How is this prevented? To help prevent the spread of norovirus: Stay at home if you are feeling sick. This will reduce the risk of spreading the virus to others. Wash your hands often with soap and water for at least 20 seconds, especially after using the toilet, helping a child use the toilet, or changing a child's diaper. Wash fruits and vegetables thoroughly before peeling, preparing, or serving them. Throw out any food that a sick person may have touched. Disinfect contaminated surfaces immediately after someone in the household has been sick. Disinfect frequently used surfaces, such as counters, doorknobs, and  faucets. Use a bleach-based household cleaner. Immediately remove and wash soiled clothes or sheets. Contact a health care provider if: You have vomiting, diarrhea, or stomach pain that gets worse. You have symptoms that do not go away after 3-6 days. You have a fever. You cannot drink without vomiting. You feel light-headed or dizzy. Your symptoms get worse. Get help right away if: You develop symptoms of dehydration that do not improve with fluid replacement, such as: Excessive sleepiness. Lack of tears. Very little urine production. Dry mouth. Muscle cramps. Weak pulse. Confusion. Summary Norovirus infection is common and often occurs in places where people are in close contact, such as schools, nursing homes, restaurants, and cruise ships. To help prevent the spread of this infection, wash hands with soap and water for at least 20 seconds before handling food or after having contact with stool or body fluids. There is no specific treatment for norovirus, but most people get better without treatment in about 2 days. People who are healthy when infected often recover sooner than those who are elderly, young, or already sick. Replace lost fluids by drinking plenty of water, or by drinking oral rehydration solution (ORS), which contains important minerals called electrolytes. This prevents dehydration. This information is not intended to replace advice given to you by your health care provider. Make sure you discuss any questions you have with your health care provider. Document Revised: 04/12/2021 Document Reviewed: 04/12/2021 Elsevier Patient Education  [place].    If you have been instructed to have an in-person evaluation today at a local Urgent Care facility, please use the link below. It will take you to a list of all of our available [facility], including address, phone number and hours of operation. Please do not delay care.  [facility]  If you or a family member do not have a primary care provider, use the link below to schedule a visit and establish care. When you choose a [facility] primary care physician or advanced practice provider, you gain a long-term partner in health. Find a Primary Care Provider  Learn more about [facility]'s in-office and virtual care options: [facility]

## 2022-09-19 ENCOUNTER — Other Ambulatory Visit: Payer: Self-pay | Admitting: Internal Medicine

## 2022-09-19 DIAGNOSIS — G43009 Migraine without aura, not intractable, without status migrainosus: Secondary | ICD-10-CM

## 2022-09-19 DIAGNOSIS — R112 Nausea with vomiting, unspecified: Secondary | ICD-10-CM

## 2022-09-19 MED ORDER — PROMETHAZINE HCL 12.5 MG PO TABS: 12.5000 mg | ORAL_TABLET | Freq: Four times a day (QID) | ORAL | 0 refills | Status: DC | PRN

## 2022-09-20 ENCOUNTER — Encounter: Payer: Self-pay | Admitting: Internal Medicine

## 2022-09-20 ENCOUNTER — Other Ambulatory Visit: Payer: Self-pay | Admitting: *Deleted

## 2022-09-20 DIAGNOSIS — G43009 Migraine without aura, not intractable, without status migrainosus: Secondary | ICD-10-CM

## 2022-09-20 MED ORDER — TRAMADOL HCL 50 MG PO TABS: ORAL_TABLET | ORAL | 0 refills | Status: DC

## 2022-09-23 ENCOUNTER — Encounter: Payer: Self-pay | Admitting: Internal Medicine

## 2022-09-23 DIAGNOSIS — E039 Hypothyroidism, unspecified: Secondary | ICD-10-CM

## 2022-09-24 MED ORDER — LEVOTHYROXINE SODIUM 25 MCG PO TABS: 25.0000 ug | ORAL_TABLET | Freq: Every day | ORAL | 3 refills | Status: DC

## 2022-10-05 ENCOUNTER — Encounter: Payer: Self-pay | Admitting: Internal Medicine

## 2022-10-08 MED ORDER — ALPRAZOLAM 0.25 MG PO TABS: 0.2500 mg | ORAL_TABLET | Freq: Every evening | ORAL | 3 refills | Status: DC | PRN

## 2022-10-09 ENCOUNTER — Encounter: Payer: Self-pay | Admitting: Internal Medicine

## 2022-10-09 DIAGNOSIS — M545 Low back pain, unspecified: Secondary | ICD-10-CM

## 2022-10-09 DIAGNOSIS — M546 Pain in thoracic spine: Secondary | ICD-10-CM

## 2022-10-09 HISTORY — DX: Pain in thoracic spine: M54.6

## 2022-10-09 HISTORY — DX: Low back pain, unspecified: M54.50

## 2022-10-10 ENCOUNTER — Other Ambulatory Visit (HOSPITAL_COMMUNITY): Payer: Self-pay | Admitting: Medical

## 2022-10-10 DIAGNOSIS — M546 Pain in thoracic spine: Secondary | ICD-10-CM

## 2022-10-11 ENCOUNTER — Telehealth: Payer: Self-pay

## 2022-10-11 NOTE — Telephone Encounter (Signed)
Patient called she is scheduled for a MRI Sunday 10/14/2022 '@10'$ :00 patient is requesting medication due to her being claustrophobic.

## 2022-10-14 ENCOUNTER — Ambulatory Visit (HOSPITAL_COMMUNITY)
Admission: RE | Admit: 2022-10-14 | Discharge: 2022-10-14 | Disposition: A | Discharge status: Home / Self Care | Payer: BC Managed Care – PPO | Source: Ambulatory Visit | Attending: Medical | Admitting: Medical

## 2022-10-14 DIAGNOSIS — M546 Pain in thoracic spine: Secondary | ICD-10-CM | POA: Diagnosis present

## 2022-10-19 ENCOUNTER — Other Ambulatory Visit: Payer: Self-pay | Admitting: Internal Medicine

## 2022-10-19 DIAGNOSIS — G43009 Migraine without aura, not intractable, without status migrainosus: Secondary | ICD-10-CM

## 2022-10-19 DIAGNOSIS — R112 Nausea with vomiting, unspecified: Secondary | ICD-10-CM

## 2022-10-19 MED ORDER — PROMETHAZINE HCL 12.5 MG PO TABS: 12.5000 mg | ORAL_TABLET | Freq: Four times a day (QID) | ORAL | 0 refills | Status: DC | PRN

## 2022-10-19 MED ORDER — TRAMADOL HCL 50 MG PO TABS: ORAL_TABLET | ORAL | 0 refills | Status: DC

## 2022-11-14 ENCOUNTER — Other Ambulatory Visit: Payer: Self-pay | Admitting: Internal Medicine

## 2022-11-14 DIAGNOSIS — G43009 Migraine without aura, not intractable, without status migrainosus: Secondary | ICD-10-CM

## 2022-11-14 MED ORDER — TRAMADOL HCL 50 MG PO TABS: ORAL_TABLET | ORAL | 0 refills | Status: DC

## 2022-12-11 ENCOUNTER — Other Ambulatory Visit: Payer: Self-pay | Admitting: Internal Medicine

## 2022-12-11 DIAGNOSIS — G43009 Migraine without aura, not intractable, without status migrainosus: Secondary | ICD-10-CM

## 2022-12-12 MED ORDER — TRAMADOL HCL 50 MG PO TABS: ORAL_TABLET | ORAL | 0 refills | Status: DC

## 2022-12-24 ENCOUNTER — Encounter (HOSPITAL_COMMUNITY): Payer: Self-pay | Admitting: Emergency Medicine

## 2022-12-24 ENCOUNTER — Ambulatory Visit (HOSPITAL_COMMUNITY)
Admission: EM | Admit: 2022-12-24 | Discharge: 2022-12-24 | Disposition: A | Discharge status: Home / Self Care | Payer: BC Managed Care – PPO | Attending: Emergency Medicine | Admitting: Emergency Medicine

## 2022-12-24 DIAGNOSIS — Z1152 Encounter for screening for COVID-19: Secondary | ICD-10-CM | POA: Diagnosis not present

## 2022-12-24 DIAGNOSIS — J0140 Acute pansinusitis, unspecified: Secondary | ICD-10-CM | POA: Diagnosis present

## 2022-12-24 MED ORDER — KETOROLAC TROMETHAMINE 30 MG/ML IJ SOLN
30.0000 mg | Freq: Once | INTRAMUSCULAR | Status: AC
  Administered 2022-12-24: 30 mg via INTRAMUSCULAR

## 2022-12-24 MED ORDER — KETOROLAC TROMETHAMINE 30 MG/ML IJ SOLN
INTRAMUSCULAR | Status: AC
  Filled 2022-12-24: qty 1

## 2022-12-24 MED ORDER — BENZONATATE 100 MG PO CAPS: 100.0000 mg | ORAL_CAPSULE | Freq: Three times a day (TID) | ORAL | 0 refills | Status: DC

## 2022-12-24 MED ORDER — AZITHROMYCIN 250 MG PO TABS: 250.0000 mg | ORAL_TABLET | Freq: Every day | ORAL | 0 refills | Status: DC

## 2022-12-24 NOTE — ED Triage Notes (Signed)
Pt c/o right ear pain and pressure on right side where has implant for 3 days. Reports fevers and nausea intermittently as well as cough. Took tylenol, advil, PCP prescribed Tramadol.

## 2022-12-24 NOTE — Discharge Instructions (Signed)
Today you are being treated for a sinus infection, begin azithromycin as directed to provide coverage for bacteria which may be new to your symptoms  You have been given an injection of Toradol which helps to reduce inflammation and helps with pain, daily will start to see relief in about 30 minutes, this medicine was given for your headache  You may use Tessalon pill every 8 hours as needed to calm your coughing  You may continue current medications to help reduce your symptoms, may also attempt any of the following below    You can take Tylenol and/or Ibuprofen as needed for fever reduction and pain relief.   For cough: honey 1/2 to 1 teaspoon (you can dilute the honey in water or another fluid).  You can also use guaifenesin and dextromethorphan for cough. You can use a humidifier for chest congestion and cough.  If you don't have a humidifier, you can sit in the bathroom with the hot shower running.      For sore throat: try warm salt water gargles, cepacol lozenges, throat spray, warm tea or water with lemon/honey, popsicles or ice, or OTC cold relief medicine for throat discomfort.   For congestion: take a daily anti-histamine like Zyrtec, Claritin, and a oral decongestant, such as pseudoephedrine.  You can also use Flonase 1-2 sprays in each nostril daily.   It is important to stay hydrated: drink plenty of fluids (water, gatorade/powerade/pedialyte, juices, or teas) to keep your throat moisturized and help further relieve irritation/discomfort.

## 2022-12-24 NOTE — ED Provider Notes (Signed)
[facility]    CSN: [id number] Arrival date & time: 12/24/22  1641      History   Chief Complaint Chief Complaint  Patient presents with   Otalgia    HPI Donna Davis is a 52 y.o. female.   Patient presents for evaluation of right-sided facial pain, right-sided ear pain, right-sided ear pressure and fullness, sore throat, a constant frontal headache and sore throat present for 3 days.  Sore throat feels as if there is thick mucus sitting inside, pain radiates down the right side of neck and lymph nodes feel swollen.  Has experienced a subjective fever and associated body aches.  No known sick contacts prior however endorses that she travels to multiple facilities across the [place] for work.  Has attempted use of Tylenol, vitamin C.  History is of migraines but endorses current headache feels different.   Sign language interpreter used  Past Medical History:  Diagnosis Date   Anxiety    Clostridium difficile infection    x several times   Deaf    uses sign language   Endometriosis    s/p TAH, LSO   Family history of malignant neoplasm of gastrointestinal tract    Gallstones    GERD (gastroesophageal reflux disease)    Hearing difficulty    b/l, 2/2 congenital rubella s/p stapedectomy. Using hearing aid   Hepatic cyst    6 mm on CT done done in 05/2005   Hypothyroidism    IBS (irritable bowel syndrome)    Migraines    "frequency depends on my stress level" (07/23/2017)   Obesity    PONV (postoperative nausea and vomiting)    "sometimes; depends on how long I'm under"   Shingles 03/2013    Patient Active Problem List   Diagnosis Date Noted   COVID 07/10/2022   Right middle ear infection 01/22/2022   Elevated BP without diagnosis of hypertension 07/17/2021   Vaginal irritation 07/17/2021   COVID-19 long hauler manifesting chronic fatigue 05/16/2021   Arthralgia of right temporomandibular joint 08/26/2018   Endometriosis 04/09/2018   Hip pain, acute, right  04/09/2018   Skin rash 04/04/2017   Allergic rhinitis due to allergen 12/19/2016   Cochlear implant in place 12/05/2016   Bilateral deafness 01/17/2016   Family history of breast cancer in mother 08/26/2015   Obesity (BMI 30-39.9) 02/18/2014   Amitriptyline adverse reaction 04/08/2013   Migraine headache 12/02/2012   Routine adult health maintenance 09/01/2012   Anxiety state 09/03/2006   Hypothyroidism 07/01/2006   GERD 07/01/2006   IBS 07/01/2006    Past Surgical History:  Procedure Laterality Date   ABDOMINAL ADHESION SURGERY     Enterolysis   CESAREAN SECTION  1994   CHOLECYSTECTOMY N/A 06/26/2013   Procedure: LAPAROSCOPIC CHOLECYSTECTOMY WITH ATTEMPTED INTRAOPERATIVE CHOLANGIOGRAM;  Surgeon: Shelly Rubenstein, MD;  Location: [facility];  Service: General;  Laterality: N/A;   COCHLEAR IMPLANT  03/30/2016   COCHLEAR IMPLANT Right 03/2015   COLONOSCOPY WITH PROPOFOL  04/23/2012   DIAGNOSTIC LAPAROSCOPY  01/14/2006   "I've had several" (07/23/2017)   ESOPHAGOGASTRODUODENOSCOPY (EGD) WITH PROPOFOL  04/23/2012   JOINT REPLACEMENT     KNEE SURGERY Right 2014   LAPAROSCOPIC OVARIAN CYSTECTOMY Right 04/16/2000   SALPINGOOPHORECTOMY Left    STAPEDECTOMY Left 05/14/2006   Thyroid Nodule removed  1997   TONSILLECTOMY     TOTAL ABDOMINAL HYSTERECTOMY  01/13/2004   Endometriosis with residual right ovary. Multiple GYN surgeries for chronic pelvic pain; had LSO in  past   TUBAL LIGATION     WOUND EXPLORATION Right 10/16/2016   Procedure: RIGHT EXPLORATION OF LACERATION OF INDEX FINGER;  Surgeon: Betha Loa, MD;  Location: [facility];  Service: Orthopedics;  Laterality: Right;    OB History     Gravida  3   Para  2   Term  2   Preterm      AB  1   Living  2      SAB      IAB  1   Ectopic      Multiple      Live Births               Home Medications    Prior to Admission medications   Medication Sig Start Date End Date Taking? Authorizing  Provider  ALPRAZolam (XANAX) 0.25 MG tablet Take 1 tablet (0.25 mg total) by mouth at bedtime as needed for anxiety. 10/08/22   Inez Catalina, MD  Ascorbic Acid (VITAMIN C PO) Take 1 tablet by mouth daily.    [provider]  busPIRone (BUSPAR) 10 MG tablet TAKE 2 TABLETS BY MOUTH 3 TIMES DAILY. 09/03/22   Inez Catalina, MD  Calcium Carbonate (CALCIUM 600 PO) Take 1 tablet by mouth daily.    [provider]  dicyclomine (BENTYL) 10 MG capsule Take 1 capsule (10 mg total) by mouth 4 (four) times daily -  before meals and at bedtime. 09/08/22   Margaretann Loveless, PA-C  estradiol (ESTRACE) 1 MG tablet Take 1 tablet (1 mg total) by mouth daily. 07/02/22   Inez Catalina, MD  famotidine (PEPCID) 20 MG tablet Take 1 tablet (20 mg total) by mouth 2 (two) times daily for 7 days. 05/18/22 05/25/22  Valinda Hoar, NP  fluconazole (DIFLUCAN) 150 MG tablet Take by mouth once and then again in 72 hours if no improvement in symptoms. 07/06/21   Inez Catalina, MD  fluticasone (FLONASE) 50 MCG/ACT nasal spray Place 1 spray into both nostrils daily. 07/10/22 07/10/23  Rana Snare, DO  Krill Oil (OMEGA-3) 500 MG CAPS Take 1 capsule by mouth daily.    [provider]  levothyroxine (EUTHYROX) 25 MCG tablet Take 1 tablet (25 mcg total) by mouth daily. 09/24/22   Inez Catalina, MD  loperamide (IMODIUM A-D) 2 MG tablet Take 1 tablet (2 mg total) by mouth 4 (four) times daily as needed for diarrhea or loose stools. 07/10/22   Rana Snare, DO  meclizine (ANTIVERT) 50 MG tablet Take 1 tablet (50 mg total) by mouth 3 (three) times daily as needed. 06/30/21   Inez Catalina, MD  omeprazole (PRILOSEC) 20 MG capsule TAKE 1 CAPSULE BY MOUTH TWICE DAILY BEFORE A MEAL OR FOOD. 08/24/21   Inez Catalina, MD  promethazine (PHENERGAN) 12.5 MG tablet Take 1 tablet (12.5 mg total) by mouth every 6 (six) hours as needed for nausea or vomiting. 10/19/22   Inez Catalina, MD  sucralfate (CARAFATE)  1 g tablet Take 1 tablet (1 g total) by mouth 4 (four) times daily -  with meals and at bedtime. 09/08/22   Margaretann Loveless, PA-C  traMADol (ULTRAM) 50 MG tablet TAKE 1 TABLET BY MOUTH UP TO EVERY 6HRS AS NEEDED FOR MODERATE PAIN*UP TO 30 DOSES 12/12/22   Inez Catalina, MD  VITAMIN D PO One daily    [provider]    Family History Family History  Problem Relation Age of  Onset   Breast cancer Mother    Ovarian cancer Mother    Prostate cancer Father    Diabetes Father    Heart disease Father        heart attack   Irritable bowel syndrome Father    Kidney disease Father    Colon polyps Father    Stomach cancer Maternal Grandmother    Colon cancer Maternal Grandmother    Diabetes Maternal Grandmother    Heart disease Other        both grandmothers   Bipolar disorder Daughter     Social History Social History   Tobacco Use   Smoking status: Former    Packs/day: 1.00    Years: 26.00    Additional pack years: 0.00    Total pack years: 26.00    Types: Cigarettes    Quit date: 02/18/2008    Years since quitting: 14.8   Smokeless tobacco: Never   Tobacco comments:    quit 2008  Vaping Use   Vaping Use: Never used  Substance Use Topics   Alcohol use: Yes    Comment: Occasionally.   Drug use: No     Allergies   Onion, Other, Shellfish allergy, Amitriptyline, Amoxicillin-pot clavulanate, Amoxicillin-pot clavulanate, Cefuroxime, Cephalexin, Ciprofloxacin, Clarithromycin, Clarithromycin, Doxycycline, Propranolol, Sulfonamide derivatives, Sulfa antibiotics, Sulfasalazine, Benadryl [diphenhydramine hcl (sleep)], and Shrimp flavor   Review of Systems Review of Systems   Physical Exam Triage Vital Signs ED Triage Vitals  Enc Vitals Group     BP 12/24/22 1655 137/88     Pulse Rate 12/24/22 1655 71     Resp 12/24/22 1655 19     Temp 12/24/22 1655 98.1 F (36.7 C)     Temp Source 12/24/22 1655 Oral     SpO2 12/24/22 1655 97 %     Weight --      Height --       Head Circumference --      Peak Flow --      Pain Score 12/24/22 1653 10     Pain Loc --      Pain Edu? --      Excl. in GC? --    No data found.  Updated Vital Signs BP 137/88 (BP Location: Right Arm)   Pulse 71   Temp 98.1 F (36.7 C) (Oral)   Resp 19   LMP 12/26/2004   SpO2 97%   Visual Acuity Right Eye Distance:   Left Eye Distance:   Bilateral Distance:    Right Eye Near:   Left Eye Near:    Bilateral Near:     Physical Exam Constitutional:      Appearance: Normal appearance.  HENT:     Right Ear: Hearing, ear canal and external ear normal. A middle ear effusion is present.     Left Ear: Hearing, tympanic membrane, ear canal and external ear normal.     Nose: Congestion and rhinorrhea present.     Mouth/Throat:     Mouth: Mucous membranes are moist.     Pharynx: No posterior oropharyngeal erythema.  Eyes:     Extraocular Movements: Extraocular movements intact.  Cardiovascular:     Rate and Rhythm: Normal rate and regular rhythm.     Pulses: Normal pulses.     Heart sounds: Normal heart sounds.  Pulmonary:     Effort: Pulmonary effort is normal.     Breath sounds: Normal breath sounds.  Skin:    General: Skin is warm and dry.  Neurological:     Mental Status: She is alert and oriented to person, place, and time. Mental status is at baseline.      UC Treatments / Results  Labs (all labs ordered are listed, but only abnormal results are displayed) Labs Reviewed - No data to display  EKG   Radiology No results found.  Procedures Procedures (including critical care time)  Medications Ordered in UC Medications - No data to display  Initial Impression / Assessment and Plan / UC Course  I have reviewed the triage vital signs and the nursing notes.  Pertinent labs & imaging results that were available during my care of the patient were reviewed by me and considered in my medical decision making (see chart for details).  Acute nonrecurrent  pansinusitis  Vital signs are stable patient is in no signs of distress nontoxic, presentation is consistent with above diagnoses therefore prophylactically providing antibiotic, azithromycin prescribed, COVID test is pending, completed due to work, discussed quarantine guidelines if positive, given Toradol injection in office for management of headache, additionally prescribed Tessalon and recommended continued supportive measures for management of symptoms, may follow-up with urgent care as needed Final Clinical Impressions(s) / UC Diagnoses   Final diagnoses:  None   Discharge Instructions   None    ED Prescriptions   None    PDMP not reviewed this encounter.   Valinda Hoar, NP 12/24/22 1941

## 2022-12-25 LAB — SARS CORONAVIRUS 2 (TAT 6-24 HRS): SARS Coronavirus 2: NEGATIVE

## 2023-01-01 ENCOUNTER — Ambulatory Visit (INDEPENDENT_AMBULATORY_CARE_PROVIDER_SITE_OTHER): Payer: BC Managed Care – PPO | Admitting: Internal Medicine

## 2023-01-01 ENCOUNTER — Encounter: Payer: Self-pay | Admitting: Internal Medicine

## 2023-01-01 ENCOUNTER — Other Ambulatory Visit: Payer: Self-pay

## 2023-01-01 VITALS — BP 149/99 | HR 72 | Temp 98.3°F | Ht 60.0 in | Wt 193.7 lb

## 2023-01-01 DIAGNOSIS — H9193 Unspecified hearing loss, bilateral: Secondary | ICD-10-CM

## 2023-01-01 DIAGNOSIS — R030 Elevated blood-pressure reading, without diagnosis of hypertension: Secondary | ICD-10-CM

## 2023-01-01 DIAGNOSIS — H6691 Otitis media, unspecified, right ear: Secondary | ICD-10-CM

## 2023-01-01 MED ORDER — CEFDINIR 300 MG PO CAPS: 300.0000 mg | ORAL_CAPSULE | Freq: Two times a day (BID) | ORAL | 0 refills | Status: AC

## 2023-01-01 MED ORDER — OXYCODONE HCL 5 MG PO TABS: 5.0000 mg | ORAL_TABLET | Freq: Three times a day (TID) | ORAL | 0 refills | Status: AC | PRN
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
## 2023-01-01 NOTE — Patient Instructions (Signed)
Dear Donna Davis,  Please get the CT scan. You need to follow up with an ENT doctor. I have placed the referral. I sent in Cefdinir to take twice daily for 7 days. Please return in 1 week for a follow up.

## 2023-01-02 ENCOUNTER — Ambulatory Visit (HOSPITAL_COMMUNITY)
Admission: RE | Admit: 2023-01-02 | Discharge: 2023-01-02 | Disposition: A | Discharge status: Home / Self Care | Payer: BC Managed Care – PPO | Source: Ambulatory Visit | Attending: Internal Medicine | Admitting: Internal Medicine

## 2023-01-02 DIAGNOSIS — H6691 Otitis media, unspecified, right ear: Secondary | ICD-10-CM | POA: Diagnosis present

## 2023-01-02 MED ORDER — IOHEXOL 350 MG/ML SOLN
75.0000 mL | Freq: Once | INTRAVENOUS | Status: AC | PRN
  Administered 2023-01-02: 75 mL via INTRAVENOUS

## 2023-01-02 NOTE — Progress Notes (Signed)
Patient's CT scan reassuring without signs of infection. Post-surgical changes are noted but no evidence of new osseous erosions or other finds to suggest infection. In the middle ear, fluid along the cochlear implant and ossicles has decreased. In the inner ear, the cochlear implant is unchanged in its position and there is no osseous erosion suggesting infection. Her auditory and facial nerve canals are normal. Fluid is noted in the mastoidectomy cavity, but this is unchanged compared to prior. These findings are compared to CT scan from 2022. Based on this, I do not believe the patient has an active infection. Her exam yesterday in the office was also inconsistent with infection.  We will discontinue her cefdinir. I am not certain of the etiology of her pain. She needs to be evaluated by ENT who is more familiar with cochlear implant complications.

## 2023-01-03 ENCOUNTER — Encounter: Payer: Self-pay | Admitting: Internal Medicine
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
## 2023-01-03 NOTE — Assessment & Plan Note (Signed)
Patient noted to be hypertensive this office visit. Presenting issue precluded further evaluation of this problem. Notably has had high blood pressure readings in the past. Current reading may be due to pain. Will need to address this next visit. She is not on any antihypertensives currently. If high at follow up, can consider starting medication.
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
## 2023-01-08 ENCOUNTER — Ambulatory Visit (INDEPENDENT_AMBULATORY_CARE_PROVIDER_SITE_OTHER): Payer: BC Managed Care – PPO | Admitting: Internal Medicine

## 2023-01-08 ENCOUNTER — Other Ambulatory Visit: Payer: Self-pay | Admitting: Internal Medicine

## 2023-01-08 ENCOUNTER — Other Ambulatory Visit: Payer: Self-pay

## 2023-01-08 ENCOUNTER — Encounter: Payer: Self-pay | Admitting: Internal Medicine

## 2023-01-08 VITALS — BP 126/84 | HR 71 | Temp 98.6°F | Ht 60.0 in | Wt 191.1 lb

## 2023-01-08 DIAGNOSIS — G43009 Migraine without aura, not intractable, without status migrainosus: Secondary | ICD-10-CM

## 2023-01-08 DIAGNOSIS — R519 Headache, unspecified: Secondary | ICD-10-CM | POA: Insufficient documentation

## 2023-01-08 DIAGNOSIS — Z0000 Encounter for general adult medical examination without abnormal findings: Secondary | ICD-10-CM

## 2023-01-08 DIAGNOSIS — E038 Other specified hypothyroidism: Secondary | ICD-10-CM | POA: Diagnosis not present

## 2023-01-08 DIAGNOSIS — Z1231 Encounter for screening mammogram for malignant neoplasm of breast: Secondary | ICD-10-CM

## 2023-01-08 NOTE — Assessment & Plan Note (Signed)
She is due for TSH today, which we will check.   She will continue her synthroid.

## 2023-01-08 NOTE — Assessment & Plan Note (Signed)
Please see "migraine headache"  My DDx includes worsening vestibular headaches, chronic pain at side of cochlear implant and temporal arteritis.  Could also consider trigeminal neuralgia, however, type of pain is inconsistent.  Plan will be ENT referral to [facility] for second opinion on removal of implant Check ESR.

## 2023-01-08 NOTE — Assessment & Plan Note (Signed)
She has persistent headaches.  Tramadol works best for her.  Headache seems to start at the site of cochlear implant.  She has vestibular symptoms as well.  She reports some possible jaw claudication but she also has clicking at the site of the TMJ on the right.  She has no temporal tenderness.  She does have increased blurry vision in the right eye.   My differential includes vestibular migraine, abnormal chronic pain at the site of the implant and also lower on the DDx would be temporal arteritis/GCA  Plan Increase tramadol to  as needed for migraine pain ENT referral ESR today.

## 2023-01-08 NOTE — Patient Instructions (Addendum)
Donna Davis - -  Thank you for coming in today!  Please INCREASE your tramadol to  when you take it and see if this helps with the pain.   I have placed a referral for you to go and see ENT at [facility].   Please get your mammogram scheduled.   Thank you!  Come back to see me in 1-2 months to see how you are doing.

## 2023-01-08 NOTE — Assessment & Plan Note (Signed)
MMG ordered today.

## 2023-01-08 NOTE — Progress Notes (Signed)
Established Patient Office Visit  Subjective   Patient ID: Donna Davis, female    DOB: [birth year]  Age: 52 y.o. MRN: [id number]  Chief Complaint  Patient presents with   Follow-up    1 week    med refills    Donna Davis is a 52yo woman with PMH of hypothyroidism, anxiety, GERD, migraines, deafness with cochlear implant, allergies, endometriosis who presents for follow up.   Donna Davis has a semi-acute issue today.  She notes 2-3 weeks of ear pain with drainage, treated with Z pack, but with persistent pressure like pain around her ear and site of her cochlear implant.  She notes pressure, boring type pain, inside the ear and behind the ear.  She notes that it will spread out from there to the entire side of her head on the right.  She has pain with pressure to the scalp.  Eye pain which is pressure like as well.  She further notes on and off chills, dizziness which feels like she is on a merry-go-round.  We discussed that prior to the dizzy episodes, she will notice nausea and then will try to get herself stable on a wall.  She was provided oxycodone for the pain but it did not help.  She feels that her tramadol helps more.  She has discussed removal of her cochlear implant with her surgeon who has not been supportive.  She would like to get another opinion.    We further discussed that she is due for her mammogram.   She is a former smoker, she quit in 2008.     Patient Active Problem List   Diagnosis Date Noted   Temporal headache 01/08/2023   Elevated blood pressure reading 07/17/2021   Vaginal irritation 07/17/2021   COVID-19 long hauler manifesting chronic fatigue 05/16/2021   Arthralgia of right temporomandibular joint 08/26/2018   Endometriosis 04/09/2018   Hip pain, acute, right 04/09/2018   Skin rash 04/04/2017   Allergic rhinitis due to allergen 12/19/2016   Cochlear implant in place 12/05/2016   Bilateral deafness 01/17/2016   Family history of breast cancer in mother  08/26/2015   Obesity (BMI 30-39.9) 02/18/2014   Amitriptyline adverse reaction 04/08/2013   Migraine headache 12/02/2012   Routine adult health maintenance 09/01/2012   Anxiety state 09/03/2006   Hypothyroidism 07/01/2006   GERD 07/01/2006   IBS 07/01/2006      Review of Systems  Constitutional:  Positive for chills and malaise/fatigue. Negative for fever and weight loss.  HENT:  Positive for ear pain and hearing loss. Negative for ear discharge.   Eyes:  Positive for blurred vision (right side).  Cardiovascular:  Negative for chest pain and palpitations.  Neurological:  Positive for dizziness and headaches. Negative for focal weakness, loss of consciousness and weakness.  Psychiatric/Behavioral:  Negative for depression. The patient is not nervous/anxious.       Objective:     BP 126/84 (BP Location: Left Arm, Cuff Size: Normal)   Pulse 71   Temp 98.6 F (37 C) (Oral)   Ht 5' (1.524 m)   Wt 191 lb 1.6 oz (86.7 kg)   LMP 12/26/2004   SpO2 99%   BMI 37.32 kg/m  BP Readings from Last 3 Encounters:  01/08/23 126/84  01/01/23 (!) 149/99  12/24/22 137/88   Wt Readings from Last 3 Encounters:  01/08/23 191 lb 1.6 oz (86.7 kg)  01/01/23 193 lb 11.2 oz (87.9 kg)  05/31/22 183 lb 6.4 oz (83.2  kg)      Physical Exam Vitals and nursing note reviewed.  Constitutional:      General: She is not in acute distress.    Appearance: Normal appearance. She is not toxic-appearing.  HENT:     Head: Normocephalic and atraumatic.     Right Ear: Tympanic membrane, ear canal and external ear normal. There is no impacted cerumen.  Eyes:     Extraocular Movements: Extraocular movements intact.     Right eye: Nystagmus (unidirectional) present.     Left eye: Nystagmus present.     Conjunctiva/sclera:     Right eye: Right conjunctiva is not injected. No hemorrhage.    Left eye: Left conjunctiva is injected. No hemorrhage. Cardiovascular:     Rate and Rhythm: Normal rate.  Pulmonary:      Effort: Pulmonary effort is normal. No respiratory distress.  Musculoskeletal:     Cervical back: Normal range of motion and neck supple.  Skin:    General: Skin is warm and dry.  Neurological:     Mental Status: She is alert and oriented to person, place, and time. Mental status is at baseline.     GCS: GCS eye subscore is 4. GCS verbal subscore is 5. GCS motor subscore is 6.     Cranial Nerves: Cranial nerves 2-12 are intact.     Motor: Motor function is intact.     Gait: Gait is intact.     Comments: Normal vestibulo-ocular reflex.  Unidirectional nystagmus, absent skew test.     ESR and TSH today.     Assessment & Plan:   Problem List Items Addressed This Visit       Unprioritized   Hypothyroidism - Primary    She is due for TSH today, which we will check.   She will continue her synthroid.       Relevant Orders   TSH   Routine adult health maintenance    MMG ordered today.       Migraine headache    She has persistent headaches.  Tramadol works best for her.  Headache seems to start at the site of cochlear implant.  She has vestibular symptoms as well.  She reports some possible jaw claudication but she also has clicking at the site of the TMJ on the right.  She has no temporal tenderness.  She does have increased blurry vision in the right eye.   My differential includes vestibular migraine, abnormal chronic pain at the site of the implant and also lower on the DDx would be temporal arteritis/GCA  Plan Increase tramadol to  as needed for migraine pain ENT referral ESR today.       Temporal headache    Please see "migraine headache"  My DDx includes worsening vestibular headaches, chronic pain at side of cochlear implant and temporal arteritis.  Could also consider trigeminal neuralgia, however, type of pain is inconsistent.  Plan will be ENT referral to [facility] for second opinion on removal of implant Check ESR.       Relevant Orders   Sed Rate  (ESR)   Ambulatory referral to ENT   Other Visit Diagnoses     Encounter for screening mammogram for breast cancer       Relevant Orders   MM Digital Screening       Return in about 2 months (around 03/10/2023).    Debe Coder, MD

## 2023-01-09 LAB — TSH: TSH: 3.74 u[IU]/mL (ref 0.450–4.500)

## 2023-01-09 LAB — SEDIMENTATION RATE: Sed Rate: 26 mm/hr (ref 0–40)

## 2023-01-09 MED ORDER — TRAMADOL HCL 50 MG PO TABS
50.0000 mg | ORAL_TABLET | Freq: Four times a day (QID) | ORAL | 0 refills | Status: DC | PRN
Start: 2023-01-09 — End: 2023-02-04

## 2023-01-23 ENCOUNTER — Ambulatory Visit (INDEPENDENT_AMBULATORY_CARE_PROVIDER_SITE_OTHER): Payer: BC Managed Care – PPO | Admitting: Student

## 2023-01-23 ENCOUNTER — Encounter: Payer: Self-pay | Admitting: Student

## 2023-01-23 VITALS — BP 143/95 | HR 73 | Temp 98.2°F | Ht 60.0 in | Wt 189.1 lb

## 2023-01-23 DIAGNOSIS — R21 Rash and other nonspecific skin eruption: Secondary | ICD-10-CM | POA: Diagnosis not present

## 2023-01-23 DIAGNOSIS — B349 Viral infection, unspecified: Secondary | ICD-10-CM | POA: Diagnosis not present

## 2023-01-23 MED ORDER — VALACYCLOVIR HCL 1 G PO TABS
1000.0000 mg | ORAL_TABLET | Freq: Three times a day (TID) | ORAL | 0 refills | Status: AC
Start: 2023-01-23 — End: 2023-01-30

## 2023-01-23 NOTE — Assessment & Plan Note (Signed)
So for 4 weeks total of symptoms.  Continues to have persistent headaches that seem to originate from the site of her cochlear implant and are associated with vestibular symptoms.  Does have jaw pain with chewing but not entirely consistent with claudication.  Persistent temporal tenderness and blurry vision on the right eye.  ESR was checked at last visit and was not elevated.  Also having new neuropathic pain with associated papular rash over the right forehead further discussed in skin rash, but do not think this is an extension of this problem.  Differential still includes vestibular migraine, abnormal chronic pain associated with cochlear implant, trigeminal neuralgia, and less likely temporal arteritis/GCA.  Plan Continue with increase tramadol to 100 mg as needed for pain Continue with ENT appointment on 03/11/2023

## 2023-01-23 NOTE — Progress Notes (Addendum)
CC: Forehead rash and pain  HPI:  Donna Davis is a 52 y.o. female with PMH hypothyroidism, anxiety, GERD, migraines, deafness with cochlear implant, allergies, endometriosis who presents to the clinic with complaints of 1 week of a red rash on her right forehead and stinging pain over her right forehead and scalp.  She has had 5 episodes of shingles in the past with her last one being 2 years ago involving her rectum.  She states that the pain is very similar to her previous shingles episodes.  She was seen on 01/08/2023 with complaints of right ear, face, and I pressure pain with blurry vision, intermittent dizziness, and chills which is still going on but has been slightly improved by an increased dose of tramadol.  She denies any new or worsening eye complaints, fevers, headaches, confusion, fatigue.  She has had some nausea associated with her persistent pain.  Past Medical History:  Diagnosis Date   Anxiety    Clostridium difficile infection    x several times   Deaf    uses sign language   Endometriosis    s/p TAH, LSO   Family history of malignant neoplasm of gastrointestinal tract    Gallstones    GERD (gastroesophageal reflux disease)    Hearing difficulty    b/l, 2/2 congenital rubella s/p stapedectomy. Using hearing aid   Hepatic cyst    6 mm on CT done done in 05/2005   Hypothyroidism    IBS (irritable bowel syndrome)    Migraines    "frequency depends on my stress level" (07/23/2017)   Obesity    PONV (postoperative nausea and vomiting)    "sometimes; depends on how long I'm under"   Shingles 03/2013   Review of Systems:   Pertinent items are noted in HPI.   Physical Exam:  Vitals:   01/23/23 0858  BP: (!) 143/95  Pulse: 73  Temp: 98.2 F (36.8 C)  TempSrc: Oral  SpO2: 100%  Weight: 189 lb 1.6 oz (85.8 kg)  Height: 5' (1.524 m)    Constitutional: Well-appearing middle-aged female. In no acute distress. HENT: Normocephalic, atraumatic, erythematous  papular rash above the right orbit with likely microvesicular lesions without crusting or bleeding.  No obvious scalp involvement but there is tenderness to palpation in the V1 dermatome on the right.  Also tenderness to palpation around the right ear without rash. Eyes: No conjunctival injection, swelling around the orbit, or other abnormalities in either eye.  Sclera non-icteric, PERRL, EOM intact Neck: No meningismus Cardio:Regular rate and rhythm.  2+ bilateral radial pulses. Pulm:Normal work of breathing on room air. ZOX:WRUEAVWU for extremity edema. Neuro:Alert and oriented x3. No focal deficit noted. Psych:Pleasant mood and affect.   Assessment & Plan:   Migraine headache So for 4 weeks total of symptoms.  Continues to have persistent headaches that seem to originate from the site of her cochlear implant and are associated with vestibular symptoms.  Does have jaw pain with chewing but not entirely consistent with claudication.  Persistent temporal tenderness and blurry vision on the right eye.  ESR was checked at last visit and was not elevated.  Also having new neuropathic pain with associated papular rash over the right forehead further discussed in skin rash, but do not think this is an extension of this problem.  Differential still includes vestibular migraine, abnormal chronic pain associated with cochlear implant, trigeminal neuralgia, and less likely temporal arteritis/GCA.  Plan Continue with increase tramadol to 100 mg as  needed for pain Continue with ENT appointment on 03/11/2023  Recurrent viral infection History of 5 episodes of shingles in multiple areas of her body including her arm, her face, her back, and her rectum with her last episode being 2 years ago.  Has never had a shingles vaccine.  Comes in today with 1 week of erythematous papular rash with what looks like microvesicles above the right orbit with neuralgic pain in the V1 distribution consistent with zoster.   However due this being possibly her sixth episode of zoster we are worried about recurrent zosteriform herpes simplex and/or underlying immune deficient state.  Last HIV test from 2014 was negative.  Has not had lesion testing from her previous painful rashes.  Plan Sterile swab of rash sent for varicella and herpes PCR HIV test Valacyclovir 1000 mg 3 times daily for 7 days    Patient seen with Dr. Dimas Chyle, DO [facility] Internal [facility] Resident PGY-1 Pager: [phone]

## 2023-01-23 NOTE — Assessment & Plan Note (Signed)
History of 5 episodes of shingles in multiple areas of her body including her arm, her face, her back, and her rectum with her last episode being 2 years ago.  Has never had a shingles vaccine.  Comes in today with 1 week of erythematous papular rash with what looks like microvesicles above the right orbit with neuralgic pain in the V1 distribution consistent with zoster.  However due this being possibly her sixth episode of zoster we are worried about recurrent zosteriform herpes simplex and/or underlying immune deficient state.  Last HIV test from 2014 was negative.  Has not had lesion testing from her previous painful rashes.  Plan Sterile swab of rash sent for varicella and herpes PCR HIV test Valacyclovir 1000 mg 3 times daily for 7 days

## 2023-01-23 NOTE — Patient Instructions (Signed)
  Thank you, [name], for allowing [place] to provide your care today. Today we discussed . . .  > Forehead rash and pain       -We are going to send tests for varicella-zoster virus and herpes virus as well as test for HIV.  We will call you with results of this test when they return.  We are also going to start valacyclovir 1000 mg 3 times a day for 7 days.  If you have any new symptoms including worsening vision, eye pain, confusion, bad headaches, fevers, or any other worrisome symptoms please call our office.  Please keep your appointment with the ENT on 03/11/2023 as we think the symptoms you have been having for 4 weeks or more related to your cochlear implant and that the new rash and pain is separate.   I have ordered the following labs for you:  Lab Orders         Varicella-zoster by PCR         Herpes Culture         HIV antibody (with reflex)       Referrals ordered today:   Referral Orders  No referral(s) requested today      I have ordered the following medication/changed the following medications:   Stop the following medications: There are no discontinued medications.   Start the following medications: Meds ordered this encounter  Medications   valACYclovir (VALTREX) 1000 MG tablet    Sig: Take 1 tablet (1,000 mg total) by mouth 3 (three) times daily for 7 days.    Dispense:  21 tablet    Refill:  0      Follow up: 2 months, hopefully after you have seen ENT, or sooner if needed   Remember:     Should you have any questions or concerns please call the internal medicine clinic at [phone].     Rocky Morel, DO [facility]

## 2023-01-25 LAB — HIV ANTIBODY (ROUTINE TESTING W REFLEX): HIV Screen 4th Generation wRfx: NONREACTIVE

## 2023-01-25 LAB — VARICELLA-ZOSTER BY PCR: Varicella-Zoster, PCR: NEGATIVE

## 2023-01-25 NOTE — Progress Notes (Signed)
Internal Medicine Clinic Attending  Case discussed with Dr. Goodwin  at the time of the visit.  We reviewed the resident's history and exam and pertinent patient test results.  I agree with the assessment, diagnosis, and plan of care documented in the resident's note.  

## 2023-01-26 LAB — HERPES SIMPLEX VIRUS CULTURE

## 2023-01-28 NOTE — Progress Notes (Signed)
Per patient request, I communicated results of negative HIV and skin swab for HSV and herpes zoster through MyChart.  Patient instructed to contact our office if the current treatment with valacyclovir has not provided any relief or if she is having any new or worsening symptoms.

## 2023-02-04 ENCOUNTER — Encounter: Payer: Self-pay | Admitting: Internal Medicine

## 2023-02-04 DIAGNOSIS — G43009 Migraine without aura, not intractable, without status migrainosus: Secondary | ICD-10-CM

## 2023-02-05 MED ORDER — TRAMADOL HCL 50 MG PO TABS
50.0000 mg | ORAL_TABLET | Freq: Four times a day (QID) | ORAL | 0 refills | Status: DC | PRN
Start: 2023-02-05 — End: 2023-03-05

## 2023-02-05 MED ORDER — ALPRAZOLAM 0.25 MG PO TABS: 0.2500 mg | ORAL_TABLET | Freq: Every evening | ORAL | 3 refills | Status: DC | PRN

## 2023-02-15 ENCOUNTER — Encounter: Payer: Self-pay | Admitting: Internal Medicine

## 2023-02-27 ENCOUNTER — Telehealth: Payer: Self-pay | Admitting: *Deleted

## 2023-02-27 NOTE — Telephone Encounter (Signed)
Patient contact (interpreter ID # [id number]) to let Alica know mammogram appointment mailed and there will be a $75.00 no show fee if she do not call to reschedule or cancel.

## 2023-03-05 ENCOUNTER — Other Ambulatory Visit: Payer: Self-pay | Admitting: Internal Medicine

## 2023-03-05 ENCOUNTER — Encounter: Payer: Self-pay | Admitting: Internal Medicine

## 2023-03-05 DIAGNOSIS — K219 Gastro-esophageal reflux disease without esophagitis: Secondary | ICD-10-CM

## 2023-03-05 DIAGNOSIS — G43009 Migraine without aura, not intractable, without status migrainosus: Secondary | ICD-10-CM

## 2023-03-05 MED ORDER — TRAMADOL HCL 50 MG PO TABS
50.0000 mg | ORAL_TABLET | Freq: Four times a day (QID) | ORAL | 0 refills | Status: DC | PRN
Start: 2023-03-05 — End: 2023-04-02

## 2023-03-05 MED ORDER — ALPRAZOLAM 0.25 MG PO TABS: 0.2500 mg | ORAL_TABLET | Freq: Every evening | ORAL | 3 refills | Status: DC | PRN

## 2023-03-05 MED ORDER — OMEPRAZOLE 20 MG PO CPDR: DELAYED_RELEASE_CAPSULE | ORAL | 3 refills | Status: DC

## 2023-03-07 ENCOUNTER — Ambulatory Visit
Admission: RE | Admit: 2023-03-07 | Discharge: 2023-03-07 | Disposition: A | Discharge status: Home / Self Care | Payer: BC Managed Care – PPO | Source: Ambulatory Visit | Attending: Internal Medicine | Admitting: Internal Medicine

## 2023-03-07 DIAGNOSIS — Z1231 Encounter for screening mammogram for malignant neoplasm of breast: Secondary | ICD-10-CM

## 2023-03-11 ENCOUNTER — Encounter: Payer: Self-pay | Admitting: Internal Medicine

## 2023-03-27 MED ORDER — PANTOPRAZOLE SODIUM 40 MG PO TBEC: 40.0000 mg | DELAYED_RELEASE_TABLET | Freq: Every day | ORAL | 3 refills | Status: DC

## 2023-03-27 NOTE — Addendum Note (Signed)
Addended by: Debe Coder B on: 03/27/2023 04:50 PM   Modules accepted: Orders

## 2023-04-02 ENCOUNTER — Other Ambulatory Visit: Payer: Self-pay | Admitting: Internal Medicine

## 2023-04-02 DIAGNOSIS — G43009 Migraine without aura, not intractable, without status migrainosus: Secondary | ICD-10-CM

## 2023-04-02 MED ORDER — TRAMADOL HCL 50 MG PO TABS
50.0000 mg | ORAL_TABLET | Freq: Four times a day (QID) | ORAL | 0 refills | Status: DC | PRN
Start: 2023-04-02 — End: 2023-05-02

## 2023-04-07 ENCOUNTER — Other Ambulatory Visit: Payer: Self-pay | Admitting: Internal Medicine

## 2023-04-07 DIAGNOSIS — N951 Menopausal and female climacteric states: Secondary | ICD-10-CM

## 2023-04-22 ENCOUNTER — Encounter: Payer: Self-pay | Admitting: Student

## 2023-04-22 ENCOUNTER — Ambulatory Visit (INDEPENDENT_AMBULATORY_CARE_PROVIDER_SITE_OTHER): Payer: BC Managed Care – PPO | Admitting: Student

## 2023-04-22 VITALS — BP 155/114 | HR 76 | Temp 98.4°F | Ht 60.0 in | Wt 197.3 lb

## 2023-04-22 DIAGNOSIS — H60311 Diffuse otitis externa, right ear: Secondary | ICD-10-CM

## 2023-04-22 DIAGNOSIS — H6690 Otitis media, unspecified, unspecified ear: Secondary | ICD-10-CM | POA: Insufficient documentation

## 2023-04-22 DIAGNOSIS — H66001 Acute suppurative otitis media without spontaneous rupture of ear drum, right ear: Secondary | ICD-10-CM

## 2023-04-22 DIAGNOSIS — H6090 Unspecified otitis externa, unspecified ear: Secondary | ICD-10-CM | POA: Insufficient documentation

## 2023-04-22 MED ORDER — AZITHROMYCIN 250 MG PO TABS: ORAL_TABLET | ORAL | 0 refills | Status: AC

## 2023-04-22 NOTE — Assessment & Plan Note (Addendum)
Patient presents with right otalgia for the past two days. The pain is described as throbbing and she endorses worsening of her right temporal headache and vertigo. She denies seasonal allergies, recent URI, fever, or swimming. Physical exam is remarkable for erythema and inflammation of the external ear canal along with an effusion behind the tympanic membrane.  PLAN: -due to numerous antibiotic allergies, azithromycin prescribed -Will treat with oral antibiotics due to hardware/cochlear implant -Patient informed to follow up with ENT -Patient will call/message [place] if she does not have resolution or improvement in symptoms by Friday.

## 2023-04-22 NOTE — Assessment & Plan Note (Signed)
Patient presents with right otalgia for the past two days. The pain is described as throbbing and she endorses worsening of her right temporal headache and vertigo. She denies seasonal allergies, recent URI, fever, or swimming. Physical exam is remarkable for erythema and inflammation of the external ear canal along with an effusion behind the tympanic membrane. PLAN: -due to numerous antibiotic allergies, azithromycin prescribed -Opted for oral antibiotics due to effusion in the setting of cochlear implant  -Patient informed to follow up with ENT -Patient will call/message [place] if she does not have resolution or improvement in symptoms by Friday.

## 2023-04-22 NOTE — Patient Instructions (Signed)
We sent azithromycin (Z pack) to the pharmacy. Please contact us if the ear pain is not better by Friday!

## 2023-04-22 NOTE — Progress Notes (Addendum)
Established Patient Office Visit  Subjective   Patient ID: Donna Davis, female    DOB: 07/21/1971  Age: 52 y.o. MRN: [id number]  Chief Complaint  Patient presents with   Otalgia    Right ear pain for about 2 days    Donna Davis is a 52 year old female with a past medical history of bilateral  sensorineural hearing loss who presents today for right ear pain for two days. Please see problem based assessment and plan for additional details.     Past Medical History:  Diagnosis Date   Anxiety    Clostridium difficile infection    x several times   Deaf    uses sign language   Endometriosis    s/p TAH, LSO   Family history of malignant neoplasm of gastrointestinal tract    Gallstones    GERD (gastroesophageal reflux disease)    Hearing difficulty    b/l, 2/2 congenital rubella s/p stapedectomy. Using hearing aid   Hepatic cyst    6 mm on CT done done in 05/2005   Hypothyroidism    IBS (irritable bowel syndrome)    Migraines    "frequency depends on my stress level" (07/23/2017)   Obesity    PONV (postoperative nausea and vomiting)    "sometimes; depends on how long I'm under"   Shingles 03/2013       Objective:     BP (!) 155/114 (BP Location: Right Arm, Patient Position: Sitting, Cuff Size: Normal)   Pulse 76   Temp 98.4 F (36.9 C) (Oral)   Ht 5' (1.524 m)   Wt 197 lb 4.8 oz (89.5 kg)   LMP 12/26/2004   SpO2 100%   BMI 38.53 kg/m  BP Readings from Last 3 Encounters:  04/22/23 (!) 155/114  01/23/23 (!) 143/95  01/08/23 126/84      Physical Exam HENT:     Right Ear: A middle ear effusion is present.     Left Ear: Tympanic membrane normal.  No middle ear effusion.     Ears:     Comments: Erythema and inflammation in right external ear canal. Tenderness present during exam.  Cardiovascular:     Rate and Rhythm: Normal rate and regular rhythm.  Pulmonary:     Effort: Pulmonary effort is normal.     Breath sounds: Normal breath sounds.  Skin:     General: Skin is warm and dry.  Neurological:     Mental Status: Mental status is at baseline.  Psychiatric:        Mood and Affect: Mood normal.      No results found for any visits on 04/22/23.  Last metabolic panel Lab Results  Component Value Date   GLUCOSE 89 05/31/2022   NA 143 05/31/2022   K 4.6 05/31/2022   CL 103 05/31/2022   CO2 23 05/31/2022   BUN 11 05/31/2022   CREATININE 0.68 05/31/2022   EGFR 104 05/31/2022   CALCIUM 9.6 05/31/2022   PROT 7.2 05/31/2022   ALBUMIN 4.5 05/31/2022   LABGLOB 2.7 05/31/2022   AGRATIO 1.7 05/31/2022   BILITOT <0.2 05/31/2022   ALKPHOS 66 05/31/2022   AST 14 05/31/2022   ALT 11 05/31/2022   ANIONGAP 9 10/22/2021   Last lipids Lab Results  Component Value Date   CHOL 185 06/13/2009   HDL 37 (L) 06/13/2009   LDLCALC 117 (H) 06/13/2009   TRIG 153 (H) 06/13/2009   CHOLHDL 5.0 Ratio 06/13/2009   Last  hemoglobin A1c Lab Results  Component Value Date   HGBA1C 5.1 11/07/2009      The ASCVD Risk score (Arnett DK, et al., 2019) failed to calculate for the following reasons:   Cannot find a previous HDL lab   Cannot find a previous total cholesterol lab    Assessment & Plan:   Problem List Items Addressed This Visit       Nervous and Auditory   Otitis media - Primary    Patient presents with right otalgia for the past two days. The pain is described as throbbing and she endorses worsening of her right temporal headache and vertigo. She denies seasonal allergies, recent URI, fever, or swimming. Physical exam is remarkable for erythema and inflammation of the external ear canal along with an effusion behind the tympanic membrane.  PLAN: -due to numerous antibiotic allergies, azithromycin prescribed -Will treat with oral antibiotics due to hardware/cochlear implant -Patient informed to follow up with ENT -Patient will call/message [place] if she does not have resolution or improvement in symptoms by Friday.       Relevant  Medications   azithromycin (ZITHROMAX Z-PAK) 250 MG tablet   Otitis externa    Patient presents with right otalgia for the past two days. The pain is described as throbbing and she endorses worsening of her right temporal headache and vertigo. She denies seasonal allergies, recent URI, fever, or swimming. Physical exam is remarkable for erythema and inflammation of the external ear canal along with an effusion behind the tympanic membrane. PLAN: -due to numerous antibiotic allergies, azithromycin prescribed -Opted for oral antibiotics due to effusion in the setting of cochlear implant  -Patient informed to follow up with ENT -Patient will call/message [place] if she does not have resolution or improvement in symptoms by Friday.         Patient discussed with Dr. Antony Contras   Return in about 2 months (around 06/22/2023) for chronic medical conditions .    Faith Rogue, DO

## 2023-04-23 NOTE — Progress Notes (Signed)
Internal Medicine Clinic Attending  I was physically present during the key portions of the resident provided service and participated in the medical decision making of patient's management care. I reviewed pertinent patient test results.  The assessment, diagnosis, and plan were formulated together and I agree with the documentation in the resident's note.  Monetta Lick, MD  

## 2023-04-29 ENCOUNTER — Other Ambulatory Visit: Payer: Self-pay | Admitting: Student

## 2023-04-29 ENCOUNTER — Encounter: Payer: Self-pay | Admitting: Internal Medicine

## 2023-04-29 MED ORDER — AZITHROMYCIN 250 MG PO TABS: ORAL_TABLET | ORAL | 0 refills | Status: AC

## 2023-05-02 ENCOUNTER — Other Ambulatory Visit: Payer: Self-pay | Admitting: Internal Medicine

## 2023-05-02 DIAGNOSIS — G43009 Migraine without aura, not intractable, without status migrainosus: Secondary | ICD-10-CM

## 2023-05-02 MED ORDER — TRAMADOL HCL 50 MG PO TABS
50.0000 mg | ORAL_TABLET | Freq: Four times a day (QID) | ORAL | 0 refills | Status: DC | PRN
Start: 2023-05-02 — End: 2023-05-31

## 2023-05-08 DIAGNOSIS — H903 Sensorineural hearing loss, bilateral: Secondary | ICD-10-CM | POA: Insufficient documentation

## 2023-05-16 NOTE — Telephone Encounter (Signed)
The patient was seen by Dina Rich, MD - 03/19/2023 4:00 PM  @ [facility] already.  If she is needing to be seen again she should be able to call their office back @ the following number.   [facility] [place] Formerly known [facility] [address]   [address] [place] [address] [phone]

## 2023-05-30 ENCOUNTER — Other Ambulatory Visit: Payer: Self-pay | Admitting: Internal Medicine

## 2023-05-31 ENCOUNTER — Other Ambulatory Visit: Payer: Self-pay | Admitting: Internal Medicine

## 2023-05-31 DIAGNOSIS — G43009 Migraine without aura, not intractable, without status migrainosus: Secondary | ICD-10-CM

## 2023-05-31 MED ORDER — TRAMADOL HCL 50 MG PO TABS
50.0000 mg | ORAL_TABLET | Freq: Four times a day (QID) | ORAL | 0 refills | Status: DC | PRN
Start: 2023-05-31 — End: 2023-07-03

## 2023-06-24 ENCOUNTER — Encounter: Payer: Self-pay | Admitting: Internal Medicine

## 2023-06-30 ENCOUNTER — Other Ambulatory Visit: Payer: Self-pay | Admitting: Student

## 2023-06-30 DIAGNOSIS — U071 COVID-19: Secondary | ICD-10-CM

## 2023-07-03 ENCOUNTER — Other Ambulatory Visit: Payer: Self-pay | Admitting: Internal Medicine

## 2023-07-03 ENCOUNTER — Encounter: Payer: Self-pay | Admitting: Internal Medicine

## 2023-07-03 DIAGNOSIS — G43009 Migraine without aura, not intractable, without status migrainosus: Secondary | ICD-10-CM

## 2023-07-03 MED ORDER — ALPRAZOLAM 0.25 MG PO TABS: 0.2500 mg | ORAL_TABLET | Freq: Every evening | ORAL | 3 refills | Status: DC | PRN

## 2023-07-03 MED ORDER — TRAMADOL HCL 50 MG PO TABS
50.0000 mg | ORAL_TABLET | Freq: Four times a day (QID) | ORAL | 0 refills | Status: DC | PRN
Start: 2023-07-03 — End: 2023-07-04

## 2023-07-04 MED ORDER — TRAMADOL HCL 50 MG PO TABS
50.0000 mg | ORAL_TABLET | Freq: Four times a day (QID) | ORAL | 0 refills | Status: DC | PRN
Start: 2023-07-04 — End: 2023-08-01

## 2023-07-15 ENCOUNTER — Ambulatory Visit (INDEPENDENT_AMBULATORY_CARE_PROVIDER_SITE_OTHER): Payer: BC Managed Care – PPO | Admitting: Internal Medicine

## 2023-07-15 ENCOUNTER — Encounter: Payer: Self-pay | Admitting: Internal Medicine

## 2023-07-15 VITALS — Temp 99.0°F | Wt 191.3 lb

## 2023-07-15 DIAGNOSIS — U071 COVID-19: Secondary | ICD-10-CM

## 2023-07-15 DIAGNOSIS — R1031 Right lower quadrant pain: Secondary | ICD-10-CM

## 2023-07-15 DIAGNOSIS — R197 Diarrhea, unspecified: Secondary | ICD-10-CM | POA: Diagnosis not present

## 2023-07-15 LAB — BASIC METABOLIC PANEL
Anion gap: 7 (ref 5–15)
BUN: 6 mg/dL (ref 6–20)
CO2: 23 mmol/L (ref 22–32)
Calcium: 8.8 mg/dL — ABNORMAL LOW (ref 8.9–10.3)
Chloride: 104 mmol/L (ref 98–111)
Creatinine, Ser: 0.68 mg/dL (ref 0.44–1.00)
GFR, Estimated: >60 mL/min (ref >60)
Glucose, Bld: 89 mg/dL (ref 70–99)
Potassium: 3.8 mmol/L (ref 3.5–5.1)
Sodium: 134 mmol/L — ABNORMAL LOW (ref 135–145)

## 2023-07-15 LAB — CBC WITH DIFFERENTIAL/PLATELET
Abs Immature Granulocytes: 0.03 10*3/uL (ref 0.00–0.07)
Basophils Absolute: 0.1 10*3/uL (ref 0.0–0.1)
Basophils Relative: 1 %
Eosinophils Absolute: 0.2 10*3/uL (ref 0.0–0.5)
Eosinophils Relative: 2 %
HCT: 40.7 % (ref 36.0–46.0)
Hemoglobin: 13.5 g/dL (ref 12.0–15.0)
Immature Granulocytes: 0 %
Lymphocytes Relative: 40 %
Lymphs Abs: 3.3 10*3/uL (ref 0.7–4.0)
MCH: 30.8 pg (ref 26.0–34.0)
MCHC: 33.2 g/dL (ref 30.0–36.0)
MCV: 92.7 fL (ref 80.0–100.0)
Monocytes Absolute: 0.6 10*3/uL (ref 0.1–1.0)
Monocytes Relative: 7 %
Neutro Abs: 4.2 10*3/uL (ref 1.7–7.7)
Neutrophils Relative %: 50 %
Platelets: 262 10*3/uL (ref 150–400)
RBC: 4.39 MIL/uL (ref 3.87–5.11)
RDW: 11.6 % (ref 11.5–15.5)
WBC: 8.4 10*3/uL (ref 4.0–10.5)
nRBC: 0 % (ref 0.0–0.2)

## 2023-07-15 MED ORDER — LOPERAMIDE HCL 2 MG PO TABS
2.0000 mg | ORAL_TABLET | Freq: Three times a day (TID) | ORAL | 0 refills | Status: DC | PRN
Start: 2023-07-15 — End: 2023-10-23

## 2023-07-15 NOTE — Patient Instructions (Signed)
Diarrhea- please bring in stool sample as soon as possible. I have placed an order for GI. You may need to have procedure to look at bowels. Once you give stool sample, you can start imodium If you have worsening dizziness, light headedness or pass out, you NEED to go to the emergency room. Diarrhea can cause blood pressure to be low and you may need IV rehydration.

## 2023-07-15 NOTE — Progress Notes (Unsigned)
Subjective:  CC: diarrhea/ stomach pain  HPI:   [name] is a 52 y.o. female with a past medical history stated below and presents today for diarrhea that started about 1 month ago.  She thought it was nerves in anticipation of surgery. She underwent surgery at [facility] on 10/10 for removal of right cochlear implant. During the hospital admission she continued to have diarrhea. She was prescribed clindamycin at discharge from the hospital and finished this 10/17. Her diarrhea continued and C Diff testing was completed on 10/24 which was negative. She denies fevers, but feels like she is going to faint when she stands up. She has had several episodes of bright red blood in stool and on toilet paper after having diarrhea. She is not taking loperamide or bentyl but has taken this many years ago to help with IBS symptoms.  ASL interpreter present throughout exam.  Please see problem based assessment and plan for additional details.  Past Medical History:  Diagnosis Date   Anxiety    Clostridium difficile infection    x several times   Deaf    uses sign language   Endometriosis    s/p TAH, LSO   Family history of malignant neoplasm of gastrointestinal tract    Gallstones    GERD (gastroesophageal reflux disease)    Hearing difficulty    b/l, 2/2 congenital rubella s/p stapedectomy. Using hearing aid   Hepatic cyst    6 mm on CT done done in 05/2005   Hypothyroidism    IBS (irritable bowel syndrome)    Migraines    "frequency depends on my stress level" (07/23/2017)   Obesity    PONV (postoperative nausea and vomiting)    "sometimes; depends on how long I'm under"   Shingles 03/2013    Current Outpatient Medications on File Davis to Visit  Medication Sig Dispense Refill   ALPRAZolam (XANAX) 0.25 MG tablet Take 1 tablet (0.25 mg total) by mouth at bedtime as needed for anxiety. 30 tablet 3   Ascorbic Acid (VITAMIN C PO) Take 1 tablet by mouth daily.     benzonatate  (TESSALON) 100 MG capsule Take 1 capsule (100 mg total) by mouth every 8 (eight) hours. 21 capsule 0   busPIRone (BUSPAR) 10 MG tablet TAKE 2 TABLETS BY MOUTH 3 TIMES A DAY 540 tablet 2   Calcium Carbonate (CALCIUM 600 PO) Take 1 tablet by mouth daily.     dicyclomine (BENTYL) 10 MG capsule Take 1 capsule (10 mg total) by mouth 4 (four) times daily -  before meals and at bedtime. 40 capsule 0   estradiol (ESTRACE) 1 MG tablet TAKE 1 TABLET BY MOUTH EVERY DAY 90 tablet 3   fluconazole (DIFLUCAN) 150 MG tablet Take by mouth once and then again in 72 hours if no improvement in symptoms. 2 tablet 0   fluticasone (FLONASE) 50 MCG/ACT nasal spray SPRAY 1 SPRAY INTO BOTH NOSTRILS DAILY. 16 mL 2   Krill Oil (OMEGA-3) 500 MG CAPS Take 1 capsule by mouth daily.     levothyroxine (EUTHYROX) 25 MCG tablet Take 1 tablet (25 mcg total) by mouth daily. 90 tablet 3   meclizine (ANTIVERT) 50 MG tablet Take 1 tablet (50 mg total) by mouth 3 (three) times daily as needed. 30 tablet 0   pantoprazole (PROTONIX) 40 MG tablet Take 1 tablet (40 mg total) by mouth daily. 90 tablet 3   promethazine (PHENERGAN) 12.5 MG tablet Take 1 tablet (12.5 mg  total) by mouth every 6 (six) hours as needed for nausea or vomiting. 30 tablet 0   sucralfate (CARAFATE) 1 g tablet Take 1 tablet (1 g total) by mouth 4 (four) times daily -  with meals and at bedtime. 40 tablet 0   traMADol (ULTRAM) 50 MG tablet Take 1 tablet (50 mg total) by mouth every 6 (six) hours as needed for moderate pain (pain score 4-6). 40 tablet 0   VITAMIN D PO One daily     No current facility-administered medications on file Davis to visit.    Family History  Problem Relation Age of Onset   Breast cancer Mother    Ovarian cancer Mother    Prostate cancer Father    Diabetes Father    Heart disease Father        heart attack   Irritable bowel syndrome Father    Kidney disease Father    Colon polyps Father    Stomach cancer Maternal Grandmother    Colon  cancer Maternal Grandmother    Diabetes Maternal Grandmother    Heart disease Other        both grandmothers   Bipolar disorder Daughter     Social History   Socioeconomic History   Marital status: Married    Spouse name: Not on file   Number of children: 2   Years of education: Not on file   Highest education level: Not on file  Occupational History   Occupation: Immunologist    Comment: works in [place]  Tobacco Use   Smoking status: Former    Current packs/day: 0.00    Average packs/day: 1 pack/day for 26.0 years (26.0 ttl pk-yrs)    Types: Cigarettes    Start date: 02/17/1981    Quit date: 02/18/2007    Years since quitting: 16.4   Smokeless tobacco: Never   Tobacco comments:    quit 2008  Vaping Use   Vaping status: Never Used  Substance and Sexual Activity   Alcohol use: Yes    Comment: Occasionally.   Drug use: No   Sexual activity: Not Currently    Partners: Male    Birth control/protection: Surgical  Other Topics Concern   Not on file  Social History Narrative   Current smoker, not using alcohol or drugs at this time   Lives with husband and 2 children   Social Determinants of Health   Financial Resource Strain: Not on file  Food Insecurity: Low Risk  (06/27/2023)   Received from [facility]   Hunger Vital Sign    Worried About Running Out of Food in the Last Year: Never true    Ran Out of Food in the Last Year: Never true  Transportation Needs: No Transportation Needs (06/27/2023)   Received from [facility]    In the past 12 months, has lack of reliable transportation kept you from medical appointments, meetings, work or from getting things needed for daily living? : No  Physical Activity: Not on file  Stress: Not on file  Social Connections: Not on file  Intimate Partner Violence: Not At Risk (01/01/2023)   Humiliation, Afraid, Rape, and Kick questionnaire    Fear of Current or Ex-Partner: No    Emotionally Abused:  No    Physically Abused: No    Sexually Abused: No    Review of Systems: ROS negative except for what is noted on the assessment and plan.  Objective:   Vitals:   07/15/23 1438  Temp: 99 F (37.2 C)  TempSrc: Oral  SpO2: 99%  Weight: 191 lb 4.8 oz (86.8 kg)    Orthostatic Vitals  Lying 144/784 P79  Sitting 137/85 P79  Standing 126/84 P88    Physical Exam: Constitutional: holding stomach Cardiovascular: regular rate and rhythm, no m/r/g Pulmonary/Chest: normal work of breathing on room air, lungs clear to auscultation bilaterally Abdominal: soft, tenderness present to epigastric region and right lower quadrant, no rebound tenderness or guarding MSK: normal bulk and tone GU: NT chaperone present Several skin tears present surrounding rectum, no external hemorroids seen, patient unable to tolerate rectal exam due to pain   Assessment & Plan:  Diarrhea Patient presents with 4 weeks of diarrhea. She has >4 episodes daily, diarrhea is described as watery with occasional bright red blood in it. She endorses nausea but had not thrown up since 10/10. She doesn't have an appetite. She is tolerating fluids with water and gatorade.  She is have dizziness with standing and light headedness but orthostatics are negative. Denies shortness of breath. She is having a lot of pain with wiping due to frequency she is going. On chart review, she has followed with [facility], Dr. Jarold Motto in the past. Last seen in 2020. Long standing history of IBS. She had a CT AP with contrast in 2020 that showed no acute abnormalities. A: Most concerning would be if she were having a diverticular bleed leading to symptomatic anemia or dehydration causing AKI. Stat CBC and BMP were reassuring with no leukocytosis, anemia or elevated creatinine seen. Other differentials include inflammatory or infectious diarrhea. C diff was negative last week but could be ETEC versus Shigella. With duration of symptoms, my  suspicion of appendicitis is low. She has had gall bladder removed. P: GI profile, PCR to evaluate for infection Stat BMP CBC within normal limits Urgent referral to GI with concerns for IBD  If unable to get close GI referral then would consider getting CT ab/pelvis with contrast to look for inflammation of colon versus abscess.   Patient discussed with Dr. Hurshel Keys Shahidah Nesbitt, D.O. [facility]  PGY-3 Pager: [phone]  Phone: [phone] Date 07/16/2023  Time 8:28 AM

## 2023-07-16 NOTE — Progress Notes (Signed)
Internal Medicine Clinic Attending  Case discussed with the resident at the time of the visit.  We reviewed the resident's history and exam and pertinent patient test results.  I agree with the assessment, diagnosis, and plan of care documented in the resident's note.     Patient presents with 1 month of watery diarrhea, now with some bright red blood in the stool and with wiping.  VSS, including orthostatics. Abdominal exam notable for epigastric & RLQ ttp, but no r/g C dif was negative Fortunately, stat BMP & CBC were normal today  I agree with plan to send stool studies and refer to GI for consideration of endoscopy.  Patient is able to tolerate PO, and Dr Sloan Leiter counseled her on warning signs for when to seek medical attention

## 2023-07-16 NOTE — Assessment & Plan Note (Signed)
Patient presents with 4 weeks of diarrhea. She has >4 episodes daily, diarrhea is described as watery with occasional bright red blood in it. She endorses nausea but had not thrown up since 10/10. She doesn't have an appetite. She is tolerating fluids with water and gatorade.  She is have dizziness with standing and light headedness but orthostatics are negative. Denies shortness of breath. She is having a lot of pain with wiping due to frequency she is going. On chart review, she has followed with [facility], Dr. Jarold Motto in the past. Last seen in 2020. Long standing history of IBS. She had a CT AP with contrast in 2020 that showed no acute abnormalities. A: Most concerning would be if she were having a diverticular bleed leading to symptomatic anemia or dehydration causing AKI. Stat CBC and BMP were reassuring with no leukocytosis, anemia or elevated creatinine seen. Other differentials include inflammatory or infectious diarrhea. C diff was negative last week but could be ETEC versus Shigella. With duration of symptoms, my suspicion of appendicitis is low. She has had gall bladder removed. P: GI profile, PCR to evaluate for infection Stat BMP CBC within normal limits Urgent referral to GI with concerns for IBD  If unable to get close GI referral then would consider getting CT ab/pelvis with contrast to look for inflammation of colon versus abscess.

## 2023-07-18 ENCOUNTER — Other Ambulatory Visit: Payer: BC Managed Care – PPO

## 2023-07-18 DIAGNOSIS — R197 Diarrhea, unspecified: Secondary | ICD-10-CM

## 2023-07-21 LAB — GI PROFILE, STOOL, PCR

## 2023-07-22 ENCOUNTER — Encounter: Payer: Self-pay | Admitting: Gastroenterology

## 2023-08-01 ENCOUNTER — Other Ambulatory Visit: Payer: Self-pay | Admitting: Student in an Organized Health Care Education/Training Program

## 2023-08-01 DIAGNOSIS — G43009 Migraine without aura, not intractable, without status migrainosus: Secondary | ICD-10-CM

## 2023-08-01 MED ORDER — TRAMADOL HCL 50 MG PO TABS
50.0000 mg | ORAL_TABLET | Freq: Four times a day (QID) | ORAL | 0 refills | Status: DC | PRN
Start: 2023-08-01 — End: 2023-08-25

## 2023-08-01 NOTE — Telephone Encounter (Signed)
Last rx written - 07/04/23. Last OV - 07/15/23. Next OV - has not been scheduled.

## 2023-08-07 ENCOUNTER — Telehealth: Payer: Self-pay | Admitting: *Deleted

## 2023-08-07 NOTE — Telephone Encounter (Signed)
Received a call from pt c/o migraines (h/a's) , nausea, pressure x 3 weeks.She has taken Advil, Tylenol, and Tramadol. Requesting an appt - she has been scheduled w/Dr Criselda Peaches on Friday 11/22 @ 0945 AM.

## 2023-08-09 ENCOUNTER — Ambulatory Visit (INDEPENDENT_AMBULATORY_CARE_PROVIDER_SITE_OTHER): Payer: BC Managed Care – PPO | Admitting: Internal Medicine

## 2023-08-09 ENCOUNTER — Other Ambulatory Visit: Payer: Self-pay

## 2023-08-09 ENCOUNTER — Encounter: Payer: Self-pay | Admitting: Internal Medicine

## 2023-08-09 VITALS — BP 128/78 | HR 82 | Temp 98.5°F | Ht 60.0 in | Wt 192.7 lb

## 2023-08-09 DIAGNOSIS — G43909 Migraine, unspecified, not intractable, without status migrainosus: Secondary | ICD-10-CM | POA: Diagnosis not present

## 2023-08-09 MED ORDER — KETOROLAC TROMETHAMINE 30 MG/ML IJ SOLN
30.0000 mg | Freq: Once | INTRAMUSCULAR | Status: AC
Start: 2023-08-09 — End: 2023-08-09
  Administered 2023-08-09: 30 mg via INTRAMUSCULAR

## 2023-08-09 MED ORDER — OXYCODONE HCL 5 MG PO TABS: 5.0000 mg | ORAL_TABLET | Freq: Three times a day (TID) | ORAL | 0 refills | Status: AC | PRN

## 2023-08-09 NOTE — Patient Instructions (Signed)
Ms. Mirkin - -  Please let me know if the Nurtec works well for you and I will send you a prescription for this medication.   You should not take more than 18 doses a month.  If you are taking more than this, let me know and we will discuss other options.   Come back to the clinic in 3-4 months, sooner if needed for chronic care.

## 2023-08-09 NOTE — Progress Notes (Signed)
Established Patient Office Visit  Subjective   Patient ID: Analy YARETSY Davis, female    DOB: 1970-12-24  Age: 52 y.o. MRN: [id number]  Chief Complaint  Patient presents with   Follow-up   Migraine     Pt stated that she has have this migraine for 3 x wks .Marland Kitchen She is worried that the migraine is coming from when she had the surgery 06/27/2023.. she has reached out to the DR who did the surgery but he told her to call her PCP .Marland Kitchen She has done OTC plus  RX given by PCP and still no help     Donna Davis is a 52 year old woman with long history of migraines.  She has congenital deafness and recently had a cochlear implant removed on the right side, which was not working and was causing symptoms.  She notes that since she had the implant removed, that she has had complete resolution of her vertigo which was causing significant distress.  However, she has had about 3 weeks pain in the right side of her head.  It does seem to originate from her surgical site.  She notes the pain fire like, throbbing, pinching and stabbing in the ear.  She notes that her skin is very sensitive to touch and she has not been able to wear her glasses.  She has been sensitive to light and has nausea. (She is non hearing, so noise is not an issue).  She has had chills, and gagging as well.  She notes it feels like a more severe version of her migraines.  She has taken her OTC medications and tramadol without relief.  She has no other neurological findings, no facial droop. She called her ENT office and was referred to [place].     Review of Systems  Constitutional:  Positive for chills. Negative for fever and malaise/fatigue.  HENT:  Positive for hearing loss. Negative for ear discharge.   Eyes:  Positive for photophobia.  Musculoskeletal:  Negative for falls.  Neurological:  Positive for tingling, sensory change and headaches. Negative for dizziness, focal weakness, seizures, loss of consciousness and weakness.      Objective:      BP 128/78 (BP Location: Left Arm, Patient Position: Sitting, Cuff Size: Large)   Pulse 82   Temp 98.5 F (36.9 C) (Oral)   Ht 5' (1.524 m)   Wt 192 lb 11.2 oz (87.4 kg)   LMP 12/26/2004   SpO2 98%   BMI 37.63 kg/m  BP Readings from Last 3 Encounters:  08/09/23 128/78  04/22/23 (!) 155/114  01/23/23 (!) 143/95   Wt Readings from Last 3 Encounters:  08/09/23 192 lb 11.2 oz (87.4 kg)  07/15/23 191 lb 4.8 oz (86.8 kg)  04/22/23 197 lb 4.8 oz (89.5 kg)      Physical Exam Vitals and nursing note reviewed.  Constitutional:      General: She is not in acute distress.    Appearance: Normal appearance. She is not toxic-appearing.  HENT:     Head: Normocephalic and atraumatic.     Comments: She has well healing surgical scars behind the right ear.  This area and the entire right side of the head is exquisitely tender to touch.  Her ear canal is clear without drainage.  She has no pain with manipulation of the tragus or pinna.  She has normal pupil size.  She has normal movement of the hands, no slurring or speech impairment.  Cardiovascular:  Rate and Rhythm: Normal rate.  Pulmonary:     Effort: Pulmonary effort is normal. No respiratory distress.  Skin:    General: Skin is warm and dry.     Coloration: Skin is not jaundiced.     Findings: No bruising, erythema, lesion or rash.  Neurological:     General: No focal deficit present.     Mental Status: She is alert and oriented to person, place, and time. Mental status is at baseline.     Sensory: Sensory deficit (severely tender to palpation over the right head) present.  Psychiatric:        Mood and Affect: Mood normal.        Behavior: Behavior normal.       Assessment & Plan:   Problem List Items Addressed This Visit       Unprioritized   Acute migraine - Primary    Today, we tried Nurtec ODT (sample provided) and toradol with only minimal relief of pain (7/10).  We discussed that this might be residual nerve pain  from the implant being removed.  It might also be a severe migraine related to the acute changes in the ear canal and skin.  Overall, she is in pain and tramadol, ketoralac and nurtec with minimal relief.   Plan:   Trial of Oxycodone 5mg  TID for 2 days (she does not desire to be on this longer) along with another dose of Nurtec tomorrow.  If not any better by Monday, she will call the clinic and we will discuss other options.         Relevant Medications   oxyCODONE (ROXICODONE) 5 MG immediate release tablet   Nurtec ODT - 2 tabs Lot #  [id number] Exp. Date 02/2025  Patient has been instructed regarding the correct time, dose, and frequency of taking this medication, including its desired effects and most common side effects.   Return in about 4 months (around 12/07/2023).    Debe Coder, MD

## 2023-08-09 NOTE — Assessment & Plan Note (Signed)
Today, we tried Nurtec ODT (sample provided) and toradol with only minimal relief of pain (7/10).  We discussed that this might be residual nerve pain from the implant being removed.  It might also be a severe migraine related to the acute changes in the ear canal and skin.  Overall, she is in pain and tramadol, ketoralac and nurtec with minimal relief.   Plan:   Trial of Oxycodone 5mg  TID for 2 days (she does not desire to be on this longer) along with another dose of Nurtec tomorrow.  If not any better by Monday, she will call the clinic and we will discuss other options.

## 2023-08-12 ENCOUNTER — Encounter: Payer: Self-pay | Admitting: Internal Medicine

## 2023-08-25 ENCOUNTER — Emergency Department (HOSPITAL_COMMUNITY): Payer: BC Managed Care – PPO

## 2023-08-25 ENCOUNTER — Encounter (HOSPITAL_COMMUNITY): Payer: Self-pay | Admitting: *Deleted

## 2023-08-25 ENCOUNTER — Other Ambulatory Visit: Payer: Self-pay | Admitting: Internal Medicine

## 2023-08-25 ENCOUNTER — Emergency Department (HOSPITAL_COMMUNITY)
Admission: EM | Admit: 2023-08-25 | Discharge: 2023-08-25 | Disposition: A | Discharge status: Home / Self Care | Payer: BC Managed Care – PPO | Attending: Emergency Medicine | Admitting: Emergency Medicine

## 2023-08-25 ENCOUNTER — Other Ambulatory Visit: Payer: Self-pay

## 2023-08-25 DIAGNOSIS — E039 Hypothyroidism, unspecified: Secondary | ICD-10-CM

## 2023-08-25 DIAGNOSIS — S060X9A Concussion with loss of consciousness of unspecified duration, initial encounter: Secondary | ICD-10-CM | POA: Diagnosis not present

## 2023-08-25 DIAGNOSIS — S63501A Unspecified sprain of right wrist, initial encounter: Secondary | ICD-10-CM | POA: Diagnosis not present

## 2023-08-25 DIAGNOSIS — W19XXXA Unspecified fall, initial encounter: Secondary | ICD-10-CM | POA: Diagnosis not present

## 2023-08-25 DIAGNOSIS — S0990XA Unspecified injury of head, initial encounter: Secondary | ICD-10-CM | POA: Diagnosis present

## 2023-08-25 MED ORDER — TRAMADOL HCL 50 MG PO TABS: 50.0000 mg | ORAL_TABLET | Freq: Four times a day (QID) | ORAL | 0 refills | Status: DC | PRN

## 2023-08-25 MED ORDER — TRAMADOL HCL 50 MG PO TABS
50.0000 mg | ORAL_TABLET | Freq: Once | ORAL | Status: AC
  Administered 2023-08-25: 50 mg via ORAL
  Filled 2023-08-25: qty 1

## 2023-08-25 NOTE — Progress Notes (Signed)
Orthopedic Tech Progress Note Patient Details:  Donna Davis 14-Jan-1971 [id number]  Pt refusing wrist splint as she states it is causing more pressure/pain around the 4th & 5th metacarpals. Ice and elevation were encouraged to help with pain/swelling.  Ortho Devices Type of Ortho Device: Velcro wrist splint Ortho Device/Splint Location: n/a Ortho Device/Splint Interventions: Ordered   Post Interventions Patient Tolerated: Refused intervention  Docia Furl 08/25/2023, 7:05 PM

## 2023-08-25 NOTE — Discharge Instructions (Addendum)
Follow-up with your primary care doctor.  Take tramadol as needed for pain.  Follow-up with sports medicine.  Recommend ice for your wrist.

## 2023-08-25 NOTE — ED Provider Triage Note (Signed)
Emergency Medicine Provider Triage Evaluation Note  Daviona P Makki , a 52 y.o. female  was evaluated in triage.  Pt complains of right hand, wrist, neck, and head pain following fall on inversion chair at home with +LOC  Felt fine before fall  Review of Systems  Positive: right hand, wrist, neck, and head pain +LOC Negative: Paresthesia, weakness  Physical Exam  BP (!) 146/78 (BP Location: Left Arm)   Pulse 73   Temp 98.4 F (36.9 C)   Resp 17   Ht 5' (1.524 m)   Wt 87.4 kg   LMP 12/26/2004   SpO2 100%   BMI 37.63 kg/m  Gen:   Awake, no distress   Resp:  Normal effort  MSK:   Moves extremities without difficulty  Other:    Medical Decision Making  Medically screening exam initiated at 3:56 PM.  Appropriate orders placed.  Kimberli P Kohtz was informed that the remainder of the evaluation will be completed by another provider, this initial triage assessment does not replace that evaluation, and the importance of remaining in the ED until their evaluation is complete.  Labs ordered  Translator provided and used during HPI   Judithann Sheen, [place] 08/25/23 1558

## 2023-08-25 NOTE — ED Provider Notes (Signed)
[place] EMERGENCY DEPARTMENT AT [facility] Provider Note   CSN: [id number] Arrival date & time: 08/25/23  1453     History  Chief Complaint  Patient presents with   Fall    Donna Davis is a 52 y.o. female.  Patient here with head trauma fall.  Patient was on a tilt table for her back which she uses when the leg portion of it broke off.  Sounds like family states that looks like the machine broke when she was on it.  She does not have a recollection of falling off of the machine but she does remember going into the room to use it.  She has a history of migraines.  Is not on blood thinners.  Patient is deaf and we are using sign language.  She has history of IBS as well.  She is not having any new back pain or lower extremity pain.  She is mostly having pain in her right wrist and forehead.  No neck pain.  No weakness numbness tingling.  This was several hours ago.  She still has some amnesia to the event but she can remember everything she did before and she is doing well now and family states she is at her baseline.  The history is provided by a caregiver and the patient.       Home Medications Prior to Admission medications   Medication Sig Start Date End Date Taking? Authorizing Provider  traMADol (ULTRAM) 50 MG tablet Take 1 tablet (50 mg total) by mouth every 6 (six) hours as needed. 08/25/23  Yes Kellon Chalk, DO  ALPRAZolam (XANAX) 0.25 MG tablet Take 1 tablet (0.25 mg total) by mouth at bedtime as needed for anxiety. 07/03/23   Earl Lagos, MD  Ascorbic Acid (VITAMIN C PO) Take 1 tablet by mouth daily.    [provider]  benzonatate (TESSALON) 100 MG capsule Take 1 capsule (100 mg total) by mouth every 8 (eight) hours. 12/24/22   Valinda Hoar, NP  busPIRone (BUSPAR) 10 MG tablet TAKE 2 TABLETS BY MOUTH 3 TIMES A DAY 05/31/23   Tyson Alias, MD  Calcium Carbonate (CALCIUM 600 PO) Take 1 tablet by mouth daily.    [provider]  dicyclomine (BENTYL) 10 MG capsule Take 1 capsule (10 mg total) by mouth 4 (four) times daily -  before meals and at bedtime. 09/08/22   Margaretann Loveless, PA-C  estradiol (ESTRACE) 1 MG tablet TAKE 1 TABLET BY MOUTH EVERY DAY 04/09/23   Inez Catalina, MD  fluconazole (DIFLUCAN) 150 MG tablet Take by mouth once and then again in 72 hours if no improvement in symptoms. 07/06/21   Inez Catalina, MD  fluticasone (FLONASE) 50 MCG/ACT nasal spray SPRAY 1 SPRAY INTO BOTH NOSTRILS DAILY. 07/02/23   Inez Catalina, MD  Boris Lown Oil (OMEGA-3) 500 MG CAPS Take 1 capsule by mouth daily.    [provider]  levothyroxine (EUTHYROX) 25 MCG tablet Take 1 tablet (25 mcg total) by mouth daily. 09/24/22   Inez Catalina, MD  loperamide (IMODIUM A-D) 2 MG tablet Take 1 tablet (2 mg total) by mouth 3 (three) times daily as needed for diarrhea or loose stools. 07/15/23   Masters, Katie, DO  meclizine (ANTIVERT) 50 MG tablet Take 1 tablet (50 mg total) by mouth 3 (three) times daily as needed. 06/30/21   Inez Catalina, MD  pantoprazole (PROTONIX) 40 MG tablet Take 1 tablet (40 mg total)  by mouth daily. 03/27/23 03/26/24  Inez Catalina, MD  promethazine (PHENERGAN) 12.5 MG tablet Take 1 tablet (12.5 mg total) by mouth every 6 (six) hours as needed for nausea or vomiting. 10/19/22   Inez Catalina, MD  sucralfate (CARAFATE) 1 g tablet Take 1 tablet (1 g total) by mouth 4 (four) times daily -  with meals and at bedtime. 09/08/22   Margaretann Loveless, PA-C  VITAMIN D PO One daily    [provider]      Allergies    Onion, Other, Shellfish allergy, Amitriptyline, Amoxicillin-pot clavulanate, Amoxicillin-pot clavulanate, Cefuroxime, Cephalexin, Ciprofloxacin, Clarithromycin, Clarithromycin, Doxycycline, Propranolol, Sulfonamide derivatives, Sulfa antibiotics, Sulfasalazine, Benadryl [diphenhydramine hcl (sleep)], and Shrimp flavor agent (non-screening)    Review of Systems   Review of  Systems  Physical Exam Updated Vital Signs BP (!) 146/86 (BP Location: Left Arm)   Pulse 80   Temp 98.7 F (37.1 C) (Oral)   Resp 16   Ht 5' (1.524 m)   Wt 87.4 kg   LMP 12/26/2004   SpO2 98%   BMI 37.63 kg/m  Physical Exam Vitals and nursing note reviewed.  Constitutional:      General: She is not in acute distress.    Appearance: She is well-developed. She is not ill-appearing.  HENT:     Head: Normocephalic and atraumatic.     Nose: Nose normal.     Mouth/Throat:     Mouth: Mucous membranes are moist.  Eyes:     Extraocular Movements: Extraocular movements intact.     Conjunctiva/sclera: Conjunctivae normal.     Pupils: Pupils are equal, round, and reactive to light.  Cardiovascular:     Rate and Rhythm: Normal rate and regular rhythm.     Pulses: Normal pulses.     Heart sounds: Normal heart sounds. No murmur heard. Pulmonary:     Effort: Pulmonary effort is normal. No respiratory distress.     Breath sounds: Normal breath sounds.  Abdominal:     Palpations: Abdomen is soft.     Tenderness: There is no abdominal tenderness.  Musculoskeletal:        General: Tenderness present. No swelling. Normal range of motion.     Cervical back: Normal range of motion and neck supple.     Comments: Tenderness to the right wrist but no obvious deformity, range of motion intact  Skin:    General: Skin is warm and dry.     Capillary Refill: Capillary refill takes less than 2 seconds.  Neurological:     General: No focal deficit present.     Mental Status: She is alert and oriented to person, place, and time.     Cranial Nerves: No cranial nerve deficit.     Sensory: No sensory deficit.     Motor: No weakness.     Coordination: Coordination normal.     Comments: 5+ out of 5 strength throughout, normal sensation, no drift, normal speech, normal finger-to-nose finger, normal visual fields  Psychiatric:        Mood and Affect: Mood normal.     ED Results / Procedures /  Treatments   Labs (all labs ordered are listed, but only abnormal results are displayed) Labs Reviewed - No data to display  EKG None  Radiology CT Head Wo Contrast  Result Date: 08/25/2023 CLINICAL DATA:  Head trauma, moderate-severe; Neck trauma, midline tenderness (Age 35-64y) EXAM: CT HEAD WITHOUT CONTRAST CT CERVICAL SPINE WITHOUT CONTRAST TECHNIQUE: Multidetector CT imaging of  the head and cervical spine was performed following the standard protocol without intravenous contrast. Multiplanar CT image reconstructions of the cervical spine were also generated. RADIATION DOSE REDUCTION: This exam was performed according to the departmental dose-optimization program which includes automated exposure control, adjustment of the mA and/or kV according to patient size and/or use of iterative reconstruction technique. COMPARISON:  None Available. FINDINGS: CT HEAD FINDINGS Brain: No hemorrhage. No hydrocephalus. No extra-axial fluid collection. No CT evidence of an cortical infarct. No mass effect. No mass lesion. Vascular: No hyperdense vessel or unexpected calcification. Skull: Normal. Negative for fracture or focal lesion. Sinuses/Orbits: Postsurgical changes from right-sided wall up mastoidectomy. Small amount of fluid in the residual mastoid air cells. No middle ear effusion. Paranasal sinuses are clear. Orbits are unremarkable. Other: None. CT CERVICAL SPINE FINDINGS Alignment: Grade 1 anterolisthesis of C2 on C3. Trace retrolisthesis of C5 on C6 Skull base and vertebrae: No acute fracture. No primary bone lesion or focal pathologic process. Soft tissues and spinal canal: No prevertebral fluid or swelling. No visible canal hematoma. Disc levels:  No CT evidence of high-grade spinal canal stenosis Upper chest: Negative. Other: Known IMPRESSION: 1. No CT evidence of intracranial injury. 2. No acute fracture or traumatic subluxation of the cervical spine. Electronically Signed   By: Lorenza Cambridge M.D.    On: 08/25/2023 17:40   CT Cervical Spine Wo Contrast  Result Date: 08/25/2023 CLINICAL DATA:  Head trauma, moderate-severe; Neck trauma, midline tenderness (Age 71-64y) EXAM: CT HEAD WITHOUT CONTRAST CT CERVICAL SPINE WITHOUT CONTRAST TECHNIQUE: Multidetector CT imaging of the head and cervical spine was performed following the standard protocol without intravenous contrast. Multiplanar CT image reconstructions of the cervical spine were also generated. RADIATION DOSE REDUCTION: This exam was performed according to the departmental dose-optimization program which includes automated exposure control, adjustment of the mA and/or kV according to patient size and/or use of iterative reconstruction technique. COMPARISON:  None Available. FINDINGS: CT HEAD FINDINGS Brain: No hemorrhage. No hydrocephalus. No extra-axial fluid collection. No CT evidence of an cortical infarct. No mass effect. No mass lesion. Vascular: No hyperdense vessel or unexpected calcification. Skull: Normal. Negative for fracture or focal lesion. Sinuses/Orbits: Postsurgical changes from right-sided wall up mastoidectomy. Small amount of fluid in the residual mastoid air cells. No middle ear effusion. Paranasal sinuses are clear. Orbits are unremarkable. Other: None. CT CERVICAL SPINE FINDINGS Alignment: Grade 1 anterolisthesis of C2 on C3. Trace retrolisthesis of C5 on C6 Skull base and vertebrae: No acute fracture. No primary bone lesion or focal pathologic process. Soft tissues and spinal canal: No prevertebral fluid or swelling. No visible canal hematoma. Disc levels:  No CT evidence of high-grade spinal canal stenosis Upper chest: Negative. Other: Known IMPRESSION: 1. No CT evidence of intracranial injury. 2. No acute fracture or traumatic subluxation of the cervical spine. Electronically Signed   By: Lorenza Cambridge M.D.   On: 08/25/2023 17:40   DG Hand Complete Right  Result Date: 08/25/2023 CLINICAL DATA:  Larey Seat EXAM: RIGHT HAND -  COMPLETE 3+ VIEW; RIGHT WRIST - COMPLETE 3+ VIEW COMPARISON:  09/20/2016, 04/27/2004 FINDINGS: Right wrist: Frontal, oblique, lateral, and ulnar deviated views of the right wrist are obtained. No acute fracture, subluxation, or dislocation. Joint spaces are well preserved. Soft tissues are unremarkable. Right Hand: Frontal, oblique, lateral views are obtained. No acute fracture, subluxation, or dislocation. Chronic posttraumatic arthritis is seen at the fourth proximal interphalangeal joint at the site of prior healed fracture fourth middle phalangeal. Mild  osteoarthritis with joint space narrowing at the first through third distal interphalangeal joints. Soft tissues are unremarkable. IMPRESSION: 1. No acute fracture within the right hand or wrist. 2. Right hand osteoarthritis as above. Electronically Signed   By: Sharlet Salina M.D.   On: 08/25/2023 16:39   DG Wrist Complete Right  Result Date: 08/25/2023 CLINICAL DATA:  Larey Seat EXAM: RIGHT HAND - COMPLETE 3+ VIEW; RIGHT WRIST - COMPLETE 3+ VIEW COMPARISON:  09/20/2016, 04/27/2004 FINDINGS: Right wrist: Frontal, oblique, lateral, and ulnar deviated views of the right wrist are obtained. No acute fracture, subluxation, or dislocation. Joint spaces are well preserved. Soft tissues are unremarkable. Right Hand: Frontal, oblique, lateral views are obtained. No acute fracture, subluxation, or dislocation. Chronic posttraumatic arthritis is seen at the fourth proximal interphalangeal joint at the site of prior healed fracture fourth middle phalangeal. Mild osteoarthritis with joint space narrowing at the first through third distal interphalangeal joints. Soft tissues are unremarkable. IMPRESSION: 1. No acute fracture within the right hand or wrist. 2. Right hand osteoarthritis as above. Electronically Signed   By: Sharlet Salina M.D.   On: 08/25/2023 16:39    Procedures Procedures    Medications Ordered in ED Medications  traMADol (ULTRAM) tablet 50 mg (50 mg  Oral Given 08/25/23 1853)    ED Course/ Medical Decision Making/ A&P                                 Medical Decision Making Risk Prescription drug management.   Donna Davis is here after a fall.  Patient with normal vitals.  No fever.  History of IBS, reflux, migraines.  Patient was using tilt table device for her low back when it looks like the leg portion of it broke and she fell off.  She has amnesia to the fall but she is at her baseline per family.  Family states that it looks like the leg portion broke and she was found underneath the machine.  She is having pain to her forehead and pain to her right wrist.  She not having any back pain lower leg pain or other extremity pain otherwise.  CT scan of her head neck and x-ray of her wrist were performed which were unremarkable per radiology report.  Offered her a wrist brace but was too painful.  We did tramadol as she is got a lot of allergies to different meds and tramadol mostly helps with her migraines.  She had great improvement of her headache.  Overall I do think she suffered a concussion from the fall but she is back to her baseline.  She is forming new memories.  She is able to ambulate without any issues.  She is neurologically intact.  Overall she will continue tramadol at home.  Will have her follow-up with primary care doctor and sports medicine/concussion specialist.  Recommend ice and rest for the wrist.  Discharged in good condition.  EKG shows sinus rhythm.  Have no concern for other organic process.  This chart was dictated using voice recognition software.  Despite best efforts to proofread,  errors can occur which can change the documentation meaning.         Final Clinical Impression(s) / ED Diagnoses Final diagnoses:  Sprain of right wrist, initial encounter  Concussion with loss of consciousness, initial encounter    Rx / DC Orders ED Discharge Orders  Ordered    traMADol (ULTRAM) 50 MG tablet  Every 6  hours PRN        08/25/23 2012              Virgina Norfolk, DO 08/25/23 2017

## 2023-08-25 NOTE — ED Triage Notes (Signed)
The pt fell off her inversion table at home earlier today  ? Loc  no one saw her fall she is deaf

## 2023-08-27 ENCOUNTER — Ambulatory Visit (INDEPENDENT_AMBULATORY_CARE_PROVIDER_SITE_OTHER): Payer: BC Managed Care – PPO | Admitting: Internal Medicine

## 2023-08-27 ENCOUNTER — Encounter: Payer: Self-pay | Admitting: Internal Medicine

## 2023-08-27 VITALS — BP 163/76 | HR 74 | Temp 98.6°F | Ht 60.0 in | Wt 197.9 lb

## 2023-08-27 DIAGNOSIS — R030 Elevated blood-pressure reading, without diagnosis of hypertension: Secondary | ICD-10-CM | POA: Diagnosis not present

## 2023-08-27 DIAGNOSIS — S060X9D Concussion with loss of consciousness of unspecified duration, subsequent encounter: Secondary | ICD-10-CM | POA: Diagnosis not present

## 2023-08-27 DIAGNOSIS — S060X9A Concussion with loss of consciousness of unspecified duration, initial encounter: Secondary | ICD-10-CM | POA: Insufficient documentation

## 2023-08-27 DIAGNOSIS — Z23 Encounter for immunization: Secondary | ICD-10-CM

## 2023-08-27 HISTORY — DX: Concussion with loss of consciousness of unspecified duration, initial encounter: S06.0X9A

## 2023-08-27 NOTE — Progress Notes (Deleted)
error 

## 2023-08-27 NOTE — Assessment & Plan Note (Signed)
Elevated BP in the setting of acute pain. She does not have a prior diagnosis of HTN and is not on anti-hypertensive medication. Will f/u in one month.

## 2023-08-27 NOTE — Progress Notes (Addendum)
Established Patient Office Visit  Subjective   Patient ID: Donna Davis, female    DOB: [birth year]  Age: 52 y.o. MRN: [id number]  Chief Complaint  Patient presents with   Follow-up    ED visit. C/o throbbing pain - head #10.    Donna Davis presents to clinic for ED f/u following a mild traumatic head injury with resulting concussion. Please see assessment/plan in problem-based charting for further details of today's visit.    Patient Active Problem List   Diagnosis Date Noted   Concussion with loss of consciousness 08/27/2023   Elevated blood pressure reading 07/17/2021   COVID-19 long hauler manifesting chronic fatigue 05/16/2021   Arthralgia of right temporomandibular joint 08/26/2018   Endometriosis 04/09/2018   Allergic rhinitis due to allergen 12/19/2016   Bilateral deafness 01/17/2016   Family history of breast cancer in mother 08/26/2015   Obesity (BMI 30-39.9) 02/18/2014   Migraine 12/02/2012   Routine adult health maintenance 09/01/2012   Anxiety state 09/03/2006   Hypothyroidism 07/01/2006   GERD 07/01/2006   IBS 07/01/2006     Objective:     BP (!) 163/76 (BP Location: Right Arm, Patient Position: Sitting, Cuff Size: Large) Comment (BP Location): Pt's preference  Pulse 74   Temp 98.6 F (37 C) (Oral)   Ht 5' (1.524 m)   Wt 197 lb 14.4 oz (89.8 kg)   LMP 12/26/2004   SpO2 99% Comment: RA  BMI 38.65 kg/m  BP Readings from Last 3 Encounters:  08/27/23 (!) 163/76  08/25/23 (!) 149/78  08/09/23 128/78   Wt Readings from Last 3 Encounters:  08/27/23 197 lb 14.4 oz (89.8 kg)  08/25/23 192 lb 10.9 oz (87.4 kg)  08/09/23 192 lb 11.2 oz (87.4 kg)     Physical Exam Constitutional:      General: She is not in acute distress.    Appearance: Normal appearance. She is not ill-appearing.  HENT:     Head:     Comments: Swelling and mild bruising of the left forehead, temple, and orbital region. No significant ptosis. EOMI. Neurological:     General: No focal  deficit present.     Mental Status: She is alert.  Psychiatric:        Mood and Affect: Mood normal.        Behavior: Behavior normal.        Thought Content: Thought content normal.        Judgment: Judgment normal.      Assessment & Plan:   Problem List Items Addressed This Visit       Nervous and Auditory   Concussion with loss of consciousness - Primary    Donna Davis presents for ED f/u after sustaining a head injury with resulting concussion. On Sunday, 12/8, patient remembers being on her inversion table for back pain prevention. Her husband found her lying under the table raising suspicion that it broke causing her to fall and hit her head. She did lose consciousness, unknown duration. She has amnesia to the event but remembers driving home from the ED. In the ED, she was neurologically intact and CT C-spine/head negative for injury.   Since Sunday, she has continued to have significant headache. She has swelling and mild bruising over the left side of the head and face. She also has some bruising over her left side and has a sore back. She has been taking ibuprofen 600 mg at night (can't take during the day due to upset stomach),  Tylenol 1000 mg every 4-6 hours, and tramadol 50 mg bid-tid prn. She has noticed some blurry vision in the left eye, nothing on the right. She denies any mood changes or sleep issues, other than awakening due to pain.  Plan -Continue ibuprofen at bedtime and Tylenol 1000 mg up to qid -Tramadol 50 mg q6h prn, may increase to 100 mg as needed. Monitor for drowsiness. -Discussed the timeline of concussion, may take days-weeks for improvement in symptoms. Monitor for significant worsening or change, return precautions.        Other   Elevated blood pressure reading    Elevated BP in the setting of acute pain. She does not have a prior diagnosis of HTN and is not on anti-hypertensive medication. Will f/u in one month.      Other Visit Diagnoses      Encounter for immunization       Relevant Orders   Flu vaccine trivalent PF, 6mos and older(Flulaval,Afluria,Fluarix,Fluzone) (Completed)       Return in about 4 weeks (around 09/24/2023) for fu BP, headache.    Dickie La, MD

## 2023-08-27 NOTE — Assessment & Plan Note (Signed)
Donna Davis presents for ED f/u after sustaining a head injury with resulting concussion. On Sunday, 12/8, patient remembers being on her inversion table for back pain prevention. Her husband found her lying under the table raising suspicion that it broke causing her to fall and hit her head. She did lose consciousness, unknown duration. She has amnesia to the event but remembers driving home from the ED. In the ED, she was neurologically intact and CT C-spine/head negative for injury.   Since Sunday, she has continued to have significant headache. She has swelling and mild bruising over the left side of the head and face. She also has some bruising over her left side and has a sore back. She has been taking ibuprofen 600 mg at night (can't take during the day due to upset stomach), Tylenol 1000 mg every 4-6 hours, and tramadol 50 mg bid-tid prn. She has noticed some blurry vision in the left eye, nothing on the right. She denies any mood changes or sleep issues, other than awakening due to pain.  Plan -Continue ibuprofen at bedtime and Tylenol 1000 mg up to qid -Tramadol 50 mg q6h prn, may increase to 100 mg as needed. Monitor for drowsiness. -Discussed the timeline of concussion, may take days-weeks for improvement in symptoms. Monitor for significant worsening or change, return precautions.

## 2023-08-29 ENCOUNTER — Encounter: Payer: Self-pay | Admitting: Internal Medicine

## 2023-08-29 DIAGNOSIS — S060X9D Concussion with loss of consciousness of unspecified duration, subsequent encounter: Secondary | ICD-10-CM

## 2023-09-02 ENCOUNTER — Telehealth: Payer: Self-pay

## 2023-09-02 MED ORDER — TRAMADOL HCL 50 MG PO TABS: ORAL_TABLET | ORAL | 0 refills | Status: DC

## 2023-09-02 NOTE — Telephone Encounter (Signed)
Prior Authorization for patient (traMADol HCl 50MG  tablets) came through on cover my meds was submitted with last office notes awaiting approval or denial..  ZOX:[id number]

## 2023-09-04 ENCOUNTER — Ambulatory Visit (INDEPENDENT_AMBULATORY_CARE_PROVIDER_SITE_OTHER): Payer: BC Managed Care – PPO | Admitting: Family Medicine

## 2023-09-04 ENCOUNTER — Encounter: Payer: Self-pay | Admitting: Family Medicine

## 2023-09-04 VITALS — BP 136/84 | HR 78 | Ht 60.0 in | Wt 196.0 lb

## 2023-09-04 DIAGNOSIS — S060X9A Concussion with loss of consciousness of unspecified duration, initial encounter: Secondary | ICD-10-CM

## 2023-09-04 DIAGNOSIS — R42 Dizziness and giddiness: Secondary | ICD-10-CM | POA: Diagnosis not present

## 2023-09-04 DIAGNOSIS — G43719 Chronic migraine without aura, intractable, without status migrainosus: Secondary | ICD-10-CM

## 2023-09-04 DIAGNOSIS — H539 Unspecified visual disturbance: Secondary | ICD-10-CM | POA: Diagnosis not present

## 2023-09-04 MED ORDER — TOPIRAMATE ER 25 MG PO CAP24: 25.0000 mg | ORAL_CAPSULE | Freq: Every day | ORAL | 12 refills | Status: DC

## 2023-09-04 NOTE — Progress Notes (Unsigned)
Subjective:   I, Stevenson Clinch, CMA acting as a scribe for Clementeen Graham, MD.  Chief Complaint: Donna Davis is a 52 y.o. female who presents to [facility] at [facility] today for initial evaluation of a head injury. +LOC. Pt was on an inversion table and the leg stabilizer piece broke off, causing her to slide off. Pt was seen at the [facility] ED following the injury. Hx of migraines.  Today, pt reports cont'd memory and focus. Notes more nausea over the past 2 hours, did vomit yesterday. Also notes black spotty peripheral vision. Does have trouble with balance but does not feel that it is any worse than prior to the injury. Becoming frustrated at difficulty completing single/simple tasks. Notes worsening drowsiness over the past 24 hours. Notes falling asleep fine but unable to stay asleep, which is very unusual for her. Also notes having cochlear implant removed about 3 weeks ago, prior to injury, but does feel that injury was related to this.   Dx imaging: 08/25/23 Head & c-spine CT  Injury date: 08/25/23 Visit #: 1  History of Present Illness:   Concussion Self-Reported Symptom Score Symptoms rated on a scale 1-6, in last 24 hours  Headache: 6   Pressure in head: 6 Neck pain: 3 Nausea or vomiting: 6 Dizziness: 1  Blurred vision: 5  Balance problems: 1 Sensitivity to light:  6 Sensitivity to noise: 0 Feeling slowed down: 6 Feeling like "in a fog": 6 "Don't feel right": 6 Difficulty concentrating: 6 Difficulty remembering: 6 Fatigue or low energy: 6 Confusion: 6 Drowsiness: 5 More emotional: 2 Irritability: 3 Sadness: 0 Nervous or anxious: 0 Trouble falling asleep: 6   Total # of Symptoms: 20/22 Total Symptom Score: 98/132  Tinnitus: Yes, L>R  Review of Systems: No fevers or chills    Review of History: Migraine headaches.  Current daily headaches more than 15 headache days per month.  Drug allergies.  Objective:    Physical Examination Vitals:    09/04/23 1511  BP: 136/84  Pulse: 78  SpO2: 98%   MSK: Decreased cervical motion Neuro: Alert and oriented normal coordination.  Positive VOMS testing. Psych: Normal speech thought process and affect.     Imaging:  CT Head Wo Contrast Result Date: 08/25/2023 CLINICAL DATA:  Head trauma, moderate-severe; Neck trauma, midline tenderness (Age 15-64y) EXAM: CT HEAD WITHOUT CONTRAST CT CERVICAL SPINE WITHOUT CONTRAST TECHNIQUE: Multidetector CT imaging of the head and cervical spine was performed following the standard protocol without intravenous contrast. Multiplanar CT image reconstructions of the cervical spine were also generated. RADIATION DOSE REDUCTION: This exam was performed according to the departmental dose-optimization program which includes automated exposure control, adjustment of the mA and/or kV according to patient size and/or use of iterative reconstruction technique. COMPARISON:  None Available. FINDINGS: CT HEAD FINDINGS Brain: No hemorrhage. No hydrocephalus. No extra-axial fluid collection. No CT evidence of an cortical infarct. No mass effect. No mass lesion. Vascular: No hyperdense vessel or unexpected calcification. Skull: Normal. Negative for fracture or focal lesion. Sinuses/Orbits: Postsurgical changes from right-sided wall up mastoidectomy. Small amount of fluid in the residual mastoid air cells. No middle ear effusion. Paranasal sinuses are clear. Orbits are unremarkable. Other: None. CT CERVICAL SPINE FINDINGS Alignment: Grade 1 anterolisthesis of C2 on C3. Trace retrolisthesis of C5 on C6 Skull base and vertebrae: No acute fracture. No primary bone lesion or focal pathologic process. Soft tissues and spinal canal: No prevertebral fluid or swelling. No visible canal hematoma.  Disc levels:  No CT evidence of high-grade spinal canal stenosis Upper chest: Negative. Other: Known IMPRESSION: 1. No CT evidence of intracranial injury. 2. No acute fracture or traumatic  subluxation of the cervical spine. Electronically Signed   By: Lorenza Cambridge M.D.   On: 08/25/2023 17:40   CT Cervical Spine Wo Contrast Result Date: 08/25/2023 CLINICAL DATA:  Head trauma, moderate-severe; Neck trauma, midline tenderness (Age 43-64y) EXAM: CT HEAD WITHOUT CONTRAST CT CERVICAL SPINE WITHOUT CONTRAST TECHNIQUE: Multidetector CT imaging of the head and cervical spine was performed following the standard protocol without intravenous contrast. Multiplanar CT image reconstructions of the cervical spine were also generated. RADIATION DOSE REDUCTION: This exam was performed according to the departmental dose-optimization program which includes automated exposure control, adjustment of the mA and/or kV according to patient size and/or use of iterative reconstruction technique. COMPARISON:  None Available. FINDINGS: CT HEAD FINDINGS Brain: No hemorrhage. No hydrocephalus. No extra-axial fluid collection. No CT evidence of an cortical infarct. No mass effect. No mass lesion. Vascular: No hyperdense vessel or unexpected calcification. Skull: Normal. Negative for fracture or focal lesion. Sinuses/Orbits: Postsurgical changes from right-sided wall up mastoidectomy. Small amount of fluid in the residual mastoid air cells. No middle ear effusion. Paranasal sinuses are clear. Orbits are unremarkable. Other: None. CT CERVICAL SPINE FINDINGS Alignment: Grade 1 anterolisthesis of C2 on C3. Trace retrolisthesis of C5 on C6 Skull base and vertebrae: No acute fracture. No primary bone lesion or focal pathologic process. Soft tissues and spinal canal: No prevertebral fluid or swelling. No visible canal hematoma. Disc levels:  No CT evidence of high-grade spinal canal stenosis Upper chest: Negative. Other: Known IMPRESSION: 1. No CT evidence of intracranial injury. 2. No acute fracture or traumatic subluxation of the cervical spine. Electronically Signed   By: Lorenza Cambridge M.D.   On: 08/25/2023 17:40   DG Hand  Complete Right Result Date: 08/25/2023 CLINICAL DATA:  Larey Seat EXAM: RIGHT HAND - COMPLETE 3+ VIEW; RIGHT WRIST - COMPLETE 3+ VIEW COMPARISON:  09/20/2016, 04/27/2004 FINDINGS: Right wrist: Frontal, oblique, lateral, and ulnar deviated views of the right wrist are obtained. No acute fracture, subluxation, or dislocation. Joint spaces are well preserved. Soft tissues are unremarkable. Right Hand: Frontal, oblique, lateral views are obtained. No acute fracture, subluxation, or dislocation. Chronic posttraumatic arthritis is seen at the fourth proximal interphalangeal joint at the site of prior healed fracture fourth middle phalangeal. Mild osteoarthritis with joint space narrowing at the first through third distal interphalangeal joints. Soft tissues are unremarkable. IMPRESSION: 1. No acute fracture within the right hand or wrist. 2. Right hand osteoarthritis as above. Electronically Signed   By: Sharlet Salina M.D.   On: 08/25/2023 16:39   DG Wrist Complete Right Result Date: 08/25/2023 CLINICAL DATA:  Larey Seat EXAM: RIGHT HAND - COMPLETE 3+ VIEW; RIGHT WRIST - COMPLETE 3+ VIEW COMPARISON:  09/20/2016, 04/27/2004 FINDINGS: Right wrist: Frontal, oblique, lateral, and ulnar deviated views of the right wrist are obtained. No acute fracture, subluxation, or dislocation. Joint spaces are well preserved. Soft tissues are unremarkable. Right Hand: Frontal, oblique, lateral views are obtained. No acute fracture, subluxation, or dislocation. Chronic posttraumatic arthritis is seen at the fourth proximal interphalangeal joint at the site of prior healed fracture fourth middle phalangeal. Mild osteoarthritis with joint space narrowing at the first through third distal interphalangeal joints. Soft tissues are unremarkable. IMPRESSION: 1. No acute fracture within the right hand or wrist. 2. Right hand osteoarthritis as above. Electronically Signed   By:  Sharlet Salina M.D.   On: 08/25/2023 16:39   I, Clementeen Graham, personally  (independently) visualized and performed the interpretation of the images attached in this note.   Assessment and Plan   52 y.o. female with concussion occurring about a week and a half ago. She still symptomatic in multiple domains.  Headache: Migraine type.  Unfortunately she already has had trials of amitriptyline which she did not tolerate in the past and propranolol which she did not tolerate.  She has taken Topamax in the past and at least tolerated initially.  However eventually she developed brain fog with this med.  Will use Trokendi which is extended Topamax which should be better tolerated a very small dose.  This should help migraine headache and headache exacerbation due to concussion.   Vestibular visual: Significant exacerbation of vestibular/visual symptoms.  Will refer to vestibular physical therapy.  Cognitive: Consider cognitive rehab through speech therapy.  She cannot take much time off of work to attend therapy.  Will have to prioritize.  Recheck in 2-3 weeks   This visit conducted using an American sign language interpreter.    Action/Discussion: Reviewed diagnosis, management options, expected outcomes, and the reasons for scheduled and emergent follow-up. Questions were adequately answered. Patient expressed verbal understanding and agreement with the following plan.     Patient Education: Reviewed with patient the risks (i.e, a repeat concussion, post-concussion syndrome, second-impact syndrome) of returning to play prior to complete resolution, and thoroughly reviewed the signs and symptoms of concussion.Reviewed need for complete resolution of all symptoms, with rest AND exertion, prior to return to play. Reviewed red flags for urgent medical evaluation: worsening symptoms, nausea/vomiting, intractable headache, musculoskeletal changes, focal neurological deficits. [facility] Concussion Care Plan, which clearly outlines the plans stated above,  was given to patient.   Level of service: Total encounter time 45 minutes including face-to-face time with the patient and, reviewing past medical record, and charting on the date of service.        After Visit Summary printed out and provided to patient as appropriate.  The above documentation has been reviewed and is accurate and complete Clementeen Graham

## 2023-09-04 NOTE — Patient Instructions (Signed)
Thank you for coming in today.   Try the trokendi medicine for headache.   I've referred you to Physical Therapy.  Let us know if you don't hear from them in one week.   Recheck in about 2-3 weeks.   Let me know if you have problems.

## 2023-09-05 ENCOUNTER — Other Ambulatory Visit: Payer: Self-pay | Admitting: Internal Medicine

## 2023-09-05 DIAGNOSIS — S060X9D Concussion with loss of consciousness of unspecified duration, subsequent encounter: Secondary | ICD-10-CM

## 2023-09-05 NOTE — Telephone Encounter (Signed)
Decision:Approved Effective 09/05/2023-03/05/2024

## 2023-09-06 NOTE — Addendum Note (Signed)
Addended by: Dickie La on: 09/06/2023 08:55 AM   Modules accepted: Orders

## 2023-09-07 ENCOUNTER — Emergency Department (HOSPITAL_COMMUNITY)
Admission: EM | Admit: 2023-09-07 | Discharge: 2023-09-07 | Disposition: A | Discharge status: Home / Self Care | Payer: Medicare Other | Source: Home / Self Care

## 2023-09-07 ENCOUNTER — Ambulatory Visit (HOSPITAL_COMMUNITY)
Admission: RE | Admit: 2023-09-07 | Discharge: 2023-09-07 | Disposition: A | Discharge status: Home / Self Care | Payer: BC Managed Care – PPO | Source: Ambulatory Visit | Attending: Internal Medicine | Admitting: Internal Medicine

## 2023-09-07 DIAGNOSIS — S060X9D Concussion with loss of consciousness of unspecified duration, subsequent encounter: Secondary | ICD-10-CM | POA: Diagnosis present

## 2023-09-11 NOTE — Therapy (Signed)
OUTPATIENT PHYSICAL THERAPY VESTIBULAR EVALUATION     Patient Name: Donna Davis MEAS MRN: [id number] DOB:04/22/1971, 52 y.o., female Today's Date: 09/12/2023  END OF SESSION:  PT End of Session - 09/12/23 1202     Visit Number 1    Number of Visits 5    Date for PT Re-Evaluation 11/11/23   due to potential delay in scheduling   Authorization Type BC[employer]    PT Start Time 1103    PT Stop Time 1145    PT Time Calculation (min) 42 min    Activity Tolerance Patient tolerated treatment well   limited by symptoms, light sensitivity   Behavior During Therapy [facility] for tasks assessed/performed             Past Medical History:  Diagnosis Date   Amitriptyline adverse reaction 04/08/2013   Anxiety    Clostridium difficile infection    x several times   Deaf    uses sign language   Endometriosis    s/p TAH, LSO   Family history of malignant neoplasm of gastrointestinal tract    Gallstones    GERD (gastroesophageal reflux disease)    Hearing difficulty    b/l, 2/2 congenital rubella s/p stapedectomy. Using hearing aid   Hepatic cyst    6 mm on CT done done in 05/2005   Hypothyroidism    IBS (irritable bowel syndrome)    Migraines    "frequency depends on my stress level" (07/23/2017)   Obesity    PONV (postoperative nausea and vomiting)    "sometimes; depends on how long I'm under"   Shingles 03/2013   Past Surgical History:  Procedure Laterality Date   ABDOMINAL ADHESION SURGERY     Enterolysis   CESAREAN SECTION  1994   CHOLECYSTECTOMY N/A 06/26/2013   Procedure: LAPAROSCOPIC CHOLECYSTECTOMY WITH ATTEMPTED INTRAOPERATIVE CHOLANGIOGRAM;  Surgeon: Shelly Rubenstein, MD;  Location: [facility];  Service: General;  Laterality: N/A;   COCHLEAR IMPLANT  03/30/2016   COCHLEAR IMPLANT Right 03/2015   COLONOSCOPY WITH PROPOFOL  04/23/2012   DIAGNOSTIC LAPAROSCOPY  01/14/2006   "I've had several" (07/23/2017)   ESOPHAGOGASTRODUODENOSCOPY (EGD) WITH PROPOFOL  04/23/2012   JOINT  REPLACEMENT     KNEE SURGERY Right 2014   LAPAROSCOPIC OVARIAN CYSTECTOMY Right 04/16/2000   SALPINGOOPHORECTOMY Left    STAPEDECTOMY Left 05/14/2006   Thyroid Nodule removed  1997   TONSILLECTOMY     TOTAL ABDOMINAL HYSTERECTOMY  01/13/2004   Endometriosis with residual right ovary. Multiple GYN surgeries for chronic pelvic pain; had LSO in past   TUBAL LIGATION     WOUND EXPLORATION Right 10/16/2016   Procedure: RIGHT EXPLORATION OF LACERATION OF INDEX FINGER;  Surgeon: Betha Loa, MD;  Location: [facility];  Service: Orthopedics;  Laterality: Right;   Patient Active Problem List   Diagnosis Date Noted   Concussion with loss of consciousness 08/27/2023   Elevated blood pressure reading 07/17/2021   COVID-19 long hauler manifesting chronic fatigue 05/16/2021   Arthralgia of right temporomandibular joint 08/26/2018   Endometriosis 04/09/2018   Allergic rhinitis due to allergen 12/19/2016   Bilateral deafness 01/17/2016   Family history of breast cancer in mother 08/26/2015   Obesity (BMI 30-39.9) 02/18/2014   Migraine 12/02/2012   Routine adult health maintenance 09/01/2012   Anxiety state 09/03/2006   Hypothyroidism 07/01/2006   GERD 07/01/2006   IBS 07/01/2006    PCP: Dickie La, MD   REFERRING PROVIDER:  Rodolph Bong, MD  REFERRING DIAG:  J19.1Y7W (ICD-10-CM) - Concussion with loss of consciousness, initial encounter R42 (ICD-10-CM) - Dizzy H53.9 (ICD-10-CM) - Vision changes  THERAPY DIAG:  Dizziness and giddiness  Cervicalgia  ONSET DATE: 09/04/2023  Rationale for Evaluation and Treatment: Rehabilitation  SUBJECTIVE:   SUBJECTIVE STATEMENT: Had a concussion in the beginning of December. Now has blurry vision, has light sensitivity, confusion, can't think straight or normally, and has had major migraines ever since. Also feeling very nauseous. Has tried the Trokendi that Dr. Denyse Amass prescribed her and feels that it has made her confusion worse. Has  not tried to talk to Dr. Denyse Amass about this yet, but planning to call him before the next appt. Notes migraines have gotten worse during the day time and at the end of the day they are horrible and are in tears. Yesterday from 3-5 PM was in tears because could not get the migraine to stop. Reports dizziness it not too bad, feels it is related to the medication. Has neck pain on the R side of her neck (has been seeing a chiropractor for years regarding this, but neck pain has been worse after concussion). Previously saw them this past Monday. With her thinking, feels like she can't focus and changes with short term memory. Has been very frustrated with work tasks.Needing to go in a dim room due to light sensitivity. Had cochlear implant removed October 10th and since it was removed, had no problems with balance. Has not seen an eye doctor yet regarding changes in vision, but PCP is working on getting pt a referral for one.   Pt accompanied by: self, in-person ASL interpreter Meghan   PERTINENT HISTORY: Per Dr. Denyse Amass: head injury. +LOC. Pt was on an inversion table and the leg stabilizer piece broke off, causing her to slide off. Pt was [facility] at the [facility] ED following the injury. Hx of migraines, cochlear implant surgery (had it removed due to complications October 2024)   Today, pt reports cont'd memory and focus. Notes more nausea over the past 2 hours, did vomit yesterday. Also notes black spotty peripheral vision. Does have trouble with balance but does not feel that it is any worse than prior to the injury. Becoming frustrated at difficulty completing single/simple tasks. Notes worsening drowsiness over the past 24 hours. Notes falling asleep fine but unable to stay asleep, which is very unusual for her. Also notes having cochlear implant removed about 3 weeks ago, prior to injury, but does feel that injury was related to this.   PAIN:  Are you having pain? Yes: NPRS scale: 10/10 Pain location:  Head Pain description: Migraines  Aggravating factors: Bright lights,  Relieving factors: Nothing works for migraines   PRECAUTIONS: None  WEIGHT BEARING RESTRICTIONS: No  FALLS: Has patient fallen in last 6 months? Yes. Number of falls many many falls before cochlear implant was removed, since it was removed in October, has had no issues with balance at all.    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  DIAGNOSTIC FINDINGS: MRI brain 09/07/23: IMPRESSION: 1.  No evidence of an acute intracranial abnormality. 2. Partially empty sella turcica. This finding can reflect incidental anatomic variation, or alternatively, it can be associated with chronic idiopathic intracranial hypertension (pseudotumor  cerebri). 3. Otherwise unremarkable non-contrast MRI appearance of the brain. 4. Mild paranasal sinus disease as described. 5. Prior right mastoidectomy. Scattered fluid within remaining right mastoid air cells.    CT cervical spine 08/25/23: IMPRESSION: 1. No CT evidence of intracranial injury. 2. No acute fracture or traumatic subluxation of the cervical spine.   COGNITION: Overall cognitive status: Impaired and with short term memory, focusing due to recent concussion  GAIT: Gait pattern: WFL and step through pattern Distance walked: Clinic distances  Assistive device utilized: None Level of assistance: Complete Independence Comments: No unsteadiness noted during ambulation in and out of clinic    VESTIBULAR ASSESSMENT:  GENERAL OBSERVATION: Ambulates into clinic independently with no AD    SYMPTOM BEHAVIOR:  Subjective history: See above.   Non-Vestibular symptoms: changes  in vision, neck pain, headaches, nausea/vomiting, and migraine symptoms  Type of dizziness:  Unsure, pt reports not really having any dizziness. Pt reports having 1-2 days of dizziness, but that was really it.    Progression of symptoms: better  OCULOMOTOR EXAM:  Ocular Alignment: normal and reports when she looks at the tip of the pen, can't see anything behind it  Ocular ROM: No Limitations  Spontaneous Nystagmus: absent  Gaze-Induced Nystagmus: absent  Smooth Pursuits: intact and reports eye sockets are sore from doing that and they hurt, reports that eyes feel tired afterwards  Saccades: intact, slow, and reports tough to do with the eyes and has difficulty finding the pen, difficulty trying to locate them   Convergence/Divergence: Will assess at next session   VESTIBULAR - OCULAR REFLEX:   Slow VOR: Normal  VOR Cancellation: Normal  Head-Impulse Test: HIT Right: did not test due to pt more symptomatic on L side  HIT Left: positive Pt reporting major dizziness with this  During head movement, pt unable to keep eyes on therapist's nose   MOTION SENSITIVITY:  Will assess at next session, did not have time during eval    Motion Sensitivity Quotient Intensity: 0 = none, 1 = Lightheaded, 2 = Mild, 3 = Moderate, 4 = Severe, 5 = Vomiting  Intensity  1. Sitting to supine   2. Supine to L side   3. Supine to R side   4. Supine to sitting   5. L Hallpike-Dix   6. Up from L    7. R Hallpike-Dix   8. Up from R    9. Sitting, head tipped to L knee   10. Head up from L knee   11. Sitting, head tipped to R knee   12. Head up from R knee   13. Sitting head turns x5   14.Sitting head nods x5   15. In stance, 180 turn to L    16. In stance, 180 turn to R      [place] Post Concussion Symptoms Questionnaire:  Compared to before the accident, do you now (last 24 hours) suffer from:  Headaches 4 = severe problem  Feeling of Dizziness 1 = no more of a problem  Nausea and/or  vomiting 3 = moderate problem  RPQ-3 (total of first 3 items) 8     Noise sensitivity (easily upset by loud noises) 0 = not experienced  Sleep disturbances 2 = mild problem  Fatigue, tiring more easily 4 = severe problem  Being irritable, easily angered: 2 = mild problem  Feeling depressed or tearful 3 = moderate problem  Feeling frustrated or impatient 2 = mild problem  Forgetfulness, poor memory 4 =  severe problem  Poor concentration 4 = severe problem  Taking longer to think 4 = severe problem  Blurred vision 4 = severe problem  Light sensitivity (easily upset by bright light) 4 = severe problem  Double vision 0 = not experienced  Restlessness 0 = not experienced  RPQ-13 (total for next 13 items) 33                                                                                                                              TREATMENT DATE:  N/A during eval    PATIENT EDUCATION: Education details: Clinical findings, POC, areas that vestibular PT will address, PT to send referral request to Dr. Denyse Amass regarding cognitive/memory changes after concussion  Person educated: Patient Education method: Explanation Education comprehension: verbalized understanding  HOME EXERCISE PROGRAM: Will provide at future session   GOALS: Goals reviewed with patient? Yes  SHORT TERM GOALS: ALL STGS = LTGS  LONG TERM GOALS: Target date: 10/10/2023  Pt will be independent with final HEP for vestibular/oculomotor deficits in order to build upon functional gains made in therapy.  Baseline:  Goal status: INITIAL  2.  MSQ to be assessed with goal written.  Baseline:  Goal status: INITIAL  3.  Pt will report RPQ-3 symptoms to a 5 or less in order to demo improvement in symptoms after concussion.  Baseline: 8 Goal status: INITIAL  4.  Pt will report RPQ-13 symptoms to a 28 or less in order to demo improvement in symptoms after concussion.  Baseline: 33 Goal status:  INITIAL    ASSESSMENT:  CLINICAL IMPRESSION: Patient is a 52 year old female referred to Neuro OPPT for concussion.   Pt's PMH is significant for: Hx of migraines, cochlear implant surgery (had it removed due to complications October 2024). The following deficits were present during the exam: light sensitivity, decr activity tolerance, R sided neck pain made worse from concussion (pt reports she has been seeing a chiropractor regarding this), dizziness, eye fatigue/difficulty with oculomotor tasks, cognitive/memory changes, headaches/migraines. PT to request referral for speech therapy, with pt in agreement. Pt would benefit from skilled PT to address these impairments and functional limitations to maximize functional mobility independence and decr symptoms in regards to concussion.    OBJECTIVE IMPAIRMENTS: decreased activity tolerance, increased muscle spasms, and pain.   ACTIVITY LIMITATIONS:  pt with symptoms with everyday tasks   PARTICIPATION LIMITATIONS: community activity, occupation, and pt going to look into FMLA   PERSONAL FACTORS: Past/current experiences, Profession, Time since onset of injury/illness/exacerbation, and 1-2 comorbidities: recent concussion, Hx of migraines, cochlear implant surgery (had it removed due to complications October 2024)  are also affecting patient's functional outcome.   REHAB POTENTIAL: Good  CLINICAL DECISION MAKING: Evolving/moderate complexity  EVALUATION COMPLEXITY: Moderate   PLAN:  PT FREQUENCY: 1x/week  PT DURATION: 8 weeks  PLANNED INTERVENTIONS: 97164- PT Re-evaluation, 97110-Therapeutic exercises, 97530- Therapeutic activity, [id number]- Neuromuscular re-education, 97535- Self Care, 11914- Manual  therapy, Patient/Family education, Balance training, Dry Needling, Vestibular training, and Visual/preceptual remediation/compensation  PLAN FOR NEXT SESSION: look at [facility] and convergence. And update goal for MSQ. Provide HEP for VOR, oculomotor  with saccades/smooth pursuits    Drake Leach, PT, DPT 09/12/2023, 12:04 PM

## 2023-09-12 ENCOUNTER — Encounter: Payer: Self-pay | Admitting: Family Medicine

## 2023-09-12 ENCOUNTER — Telehealth: Payer: Self-pay | Admitting: Physical Therapy

## 2023-09-12 ENCOUNTER — Ambulatory Visit: Payer: BC Managed Care – PPO | Attending: Family Medicine | Admitting: Physical Therapy

## 2023-09-12 DIAGNOSIS — S060X9A Concussion with loss of consciousness of unspecified duration, initial encounter: Secondary | ICD-10-CM | POA: Diagnosis present

## 2023-09-12 DIAGNOSIS — M542 Cervicalgia: Secondary | ICD-10-CM | POA: Insufficient documentation

## 2023-09-12 DIAGNOSIS — H539 Unspecified visual disturbance: Secondary | ICD-10-CM | POA: Diagnosis not present

## 2023-09-12 DIAGNOSIS — R42 Dizziness and giddiness: Secondary | ICD-10-CM | POA: Diagnosis present

## 2023-09-12 NOTE — Telephone Encounter (Signed)
Dr. Denyse Amass, Cecile Mcilhenny was evaluated by PT on 09/12/23.  The patient would benefit from a speech therapy evaluation for cognitive and memory changes after concussion.   If you agree, please place an order in [facility] workque in [facility] or fax the order to [phone]. Thank you, Sherlie Ban, PT, DPT 09/12/23 12:19 PM    [facility] [address] [place], [place]  [address] Phone:  [phone] Fax:  [phone]

## 2023-09-13 ENCOUNTER — Encounter: Payer: Self-pay | Admitting: Internal Medicine

## 2023-09-16 NOTE — Telephone Encounter (Signed)
Order placed.  Thank you for the recommendation and clarification.

## 2023-09-17 ENCOUNTER — Ambulatory Visit: Payer: BC Managed Care – PPO | Admitting: Physical Therapy

## 2023-09-17 ENCOUNTER — Ambulatory Visit: Payer: Self-pay | Admitting: Physical Therapy

## 2023-09-17 VITALS — BP 134/81 | HR 80

## 2023-09-17 DIAGNOSIS — R42 Dizziness and giddiness: Secondary | ICD-10-CM | POA: Diagnosis not present

## 2023-09-17 DIAGNOSIS — S060X9A Concussion with loss of consciousness of unspecified duration, initial encounter: Secondary | ICD-10-CM

## 2023-09-17 DIAGNOSIS — M542 Cervicalgia: Secondary | ICD-10-CM

## 2023-09-17 NOTE — Therapy (Signed)
 OUTPATIENT PHYSICAL THERAPY VESTIBULAR TREATMENT     Patient Name: Donna Davis MRN: [id number] DOB:November 15, 1970, 52 y.o., female Today's Date: 09/17/2023  END OF SESSION:  PT End of Session - 09/17/23 1619     Visit Number 2    Number of Visits 5    Date for PT Re-Evaluation 11/11/23    Authorization Type BCBS    PT Start Time 1617    PT Stop Time 1700    PT Time Calculation (min) 43 min    Equipment Utilized During Treatment Other (comment)   gait belt not indicated as at mat level   Activity Tolerance Patient tolerated treatment well;Treatment limited secondary to medical complications (Comment)   migraine   Behavior During Therapy [facility] for tasks assessed/performed             Past Medical History:  Diagnosis Date   Amitriptyline  adverse reaction 04/08/2013   Anxiety    Clostridium difficile infection    x several times   Deaf    uses sign language   Endometriosis    s/p TAH, LSO   Family history of malignant neoplasm of gastrointestinal tract    Gallstones    GERD (gastroesophageal reflux disease)    Hearing difficulty    b/l, 2/2 congenital rubella s/p stapedectomy. Using hearing aid   Hepatic cyst    6 mm on CT done done in 05/2005   Hypothyroidism    IBS (irritable bowel syndrome)    Migraines    frequency depends on my stress level (07/23/2017)   Obesity    PONV (postoperative nausea and vomiting)    sometimes; depends on how long I'm under   Shingles 03/2013   Past Surgical History:  Procedure Laterality Date   ABDOMINAL ADHESION SURGERY     Enterolysis   CESAREAN SECTION  1994   CHOLECYSTECTOMY N/A 06/26/2013   Procedure: LAPAROSCOPIC CHOLECYSTECTOMY WITH ATTEMPTED INTRAOPERATIVE CHOLANGIOGRAM;  Surgeon: Vicenta DELENA Poli, MD;  Location: [facility];  Service: General;  Laterality: N/A;   COCHLEAR IMPLANT  03/30/2016   COCHLEAR IMPLANT Right 03/2015   COLONOSCOPY WITH PROPOFOL   04/23/2012   DIAGNOSTIC LAPAROSCOPY  01/14/2006   I've had several  (07/23/2017)   ESOPHAGOGASTRODUODENOSCOPY (EGD) WITH PROPOFOL   04/23/2012   JOINT REPLACEMENT     KNEE SURGERY Right 2014   LAPAROSCOPIC OVARIAN CYSTECTOMY Right 04/16/2000   SALPINGOOPHORECTOMY Left    STAPEDECTOMY Left 05/14/2006   Thyroid  Nodule removed  1997   TONSILLECTOMY     TOTAL ABDOMINAL HYSTERECTOMY  01/13/2004   Endometriosis with residual right ovary. Multiple GYN surgeries for chronic pelvic pain; had LSO in past   TUBAL LIGATION     WOUND EXPLORATION Right 10/16/2016   Procedure: RIGHT EXPLORATION OF LACERATION OF INDEX FINGER;  Surgeon: Franky Curia, MD;  Location: [facility];  Service: Orthopedics;  Laterality: Right;   Patient Active Problem List   Diagnosis Date Noted   Concussion with loss of consciousness 08/27/2023   Elevated blood pressure reading 07/17/2021   COVID-19 long hauler manifesting chronic fatigue 05/16/2021   Arthralgia of right temporomandibular joint 08/26/2018   Endometriosis 04/09/2018   Allergic rhinitis due to allergen 12/19/2016   Bilateral deafness 01/17/2016   Family history of breast cancer in mother 08/26/2015   Obesity (BMI 30-39.9) 02/18/2014   Migraine 12/02/2012   Routine adult health maintenance 09/01/2012   Anxiety state 09/03/2006   Hypothyroidism 07/01/2006   GERD 07/01/2006   IBS 07/01/2006    PCP: [name],  MD   REFERRING PROVIDER:  Joane Artist RAMAN, MD  REFERRING DIAG: S06.0X9A (ICD-10-CM) - Concussion with loss of consciousness, initial encounter R42 (ICD-10-CM) - Dizzy H53.9 (ICD-10-CM) - Vision changes  THERAPY DIAG:  Concussion with loss of consciousness, initial encounter  Dizziness and giddiness  Cervicalgia  ONSET DATE: 09/04/2023  Rationale for Evaluation and Treatment: Rehabilitation  SUBJECTIVE:   SUBJECTIVE STATEMENT: Patient arrives to session with reports of migraine; reports that it is somewhat better since stopping the topiramate  medication; Dr. Joane aware per patient. Patient  reports as she cannot tolerate any migraine medication she wants to trial PT exercises and if still no improvement will go aware; patient is aware that progress may be limited without migraines controlled. Patient waiting to hear back from eye doctor to get scheduled. She has reached out to the office.  Pt accompanied by: self, in-person ASL interpreter Burnard  PERTINENT HISTORY: Per Dr. Joane: head injury. +LOC. Pt was on an inversion table and the leg stabilizer piece broke off, causing her to slide off. Pt was seen at the [facility] ED following the injury. Hx of migraines, cochlear implant surgery (had it removed due to complications October 2024)   Today, pt reports cont'd memory and focus. Notes more nausea over the past 2 hours, did vomit yesterday. Also notes black spotty peripheral vision. Does have trouble with balance but does not feel that it is any worse than prior to the injury. Becoming frustrated at difficulty completing single/simple tasks. Notes worsening drowsiness over the past 24 hours. Notes falling asleep fine but unable to stay asleep, which is very unusual for her. Also notes having cochlear implant removed about 3 weeks ago, prior to injury, but does feel that injury was related to this.   PAIN:  Are you having pain? Yes: NPRS scale: 10/10 Pain location: Head Pain description: Migraines  Aggravating factors: Bright lights,  Relieving factors: Nothing works for migraines   PRECAUTIONS: None  WEIGHT BEARING RESTRICTIONS: No  FALLS: Has patient fallen in last 6 months? Yes. Number of falls many many falls before cochlear implant was removed, since it was removed in October, has had no issues with balance at all.    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  DIAGNOSTIC FINDINGS: MRI brain 09/07/23: IMPRESSION: 1.  No evidence of an acute intracranial abnormality. 2. Partially empty sella turcica. This finding can reflect incidental anatomic variation, or alternatively, it can be associated with chronic idiopathic intracranial hypertension (pseudotumor cerebri). 3. Otherwise unremarkable non-contrast MRI appearance of the brain. 4. Mild paranasal sinus disease as described. 5. Prior right mastoidectomy. Scattered fluid within remaining right mastoid air cells.    CT cervical spine 08/25/23: IMPRESSION: 1. No CT evidence of intracranial injury. 2. No acute fracture or traumatic subluxation of the cervical spine.   COGNITION: Overall cognitive status: Impaired and with short term memory, focusing due to recent concussion  TREATMENT DATE:   Session performed in dim room per patient tolerance  NMR:  MOTION SENSITIVITY:  Will assess at next session, did not have time during eval    Motion Sensitivity Quotient Intensity: 0 = none, 1 = Lightheaded, 2 = Mild, 3 = Moderate, 4 = Severe, 5 = Vomiting  Intensity  1. Sitting to supine 0  2. Supine to L side 0  3. Supine to R side 0  4. Supine to sitting 0  5. L Hallpike-Dix   6. Up from L    7. R Hallpike-Dix   8. Up from R    9. Sitting, head tipped to L knee 0  10. Head up from L knee 0  11. Sitting, head tipped to R knee 0  12. Head up from R knee 0  13. Sitting head turns x5 0  14.Sitting head nods x5 0  15. In stance, 180 turn to L  0  16. In stance, 180 turn to R 0    HEP Exercises reviewed briefly in session: - Seated Horizontal Saccades 2 sets -  5-10 reps - Seated Vertical Saccades  1 set - 5 reps - Pencil Pushups  2 sets - 5 reps  VESTIBULAR  TREATMENT:  Gaze Adaptation: x1 Viewing Horizontal: Position: seated plain background, Time: 15-25 seconds, Reps: 2, and Comment: reports blurriness in surrounding background, educated on duration of movement pattern and x1 Viewing Vertical:  Position: seated , Time: 30 seconds, Reps: 1, and Comment: less symptomatic than horizontal  TherAct: Discussed post-concussion symptom management including gradual increase in activities, re-introducing daily walks, looking away to plain background for eye rests, symptom management  PATIENT EDUCATION: Education details: See above + initial eval Person educated: Patient Education method: Explanation Education comprehension: verbalized understanding and needs further education  HOME EXERCISE PROGRAM: Access Code: [email or web address] URL: [email or web address] Date: 09/17/2023 Prepared by: Lauraine Grumbling  Exercises - Seated Horizontal Saccades  - 1 x daily - 7 x weekly - 3 sets - 5-10 reps - Seated Vertical Saccades  - 1 x daily - 7 x weekly - 3 sets - 5-10 reps - Pencil Pushups  - 1 x daily - 7 x weekly - 3 sets - 10 reps  Educated on target goals but to modify as needed for eye fatigue.   Gaze Stabilization: Sitting    Keeping eyes on target on wall 5 feet away, tilt head down 15-30 and move head side to side for 30 seconds if tolerated. Repeat while moving head up and down for 30 seconds if tolerated. Do up to 5-6 sessions per day as tolerated. Repeat using target on pattern background.   Encouraged 1 daily walk with sunglasses as patient required sunglasses prior to injury due to migraines being triggered by sunlight  GOALS: Goals reviewed with patient? Yes  SHORT TERM GOALS: ALL STGS = LTGS  LONG TERM GOALS: Target date: 10/10/2023  Pt will be independent with final HEP for vestibular/oculomotor deficits in order to build upon functional gains made in therapy.  Baseline: Provided on 12/31 Goal status: INITIAL  2.  MSQ to be  assessed with goal written.  Baseline: 0 Goal status: Discontinued as no reports of dizziness  3.  Pt will report RPQ-3 symptoms to a 5 or less in order to demo improvement in symptoms after concussion.  Baseline: 8 Goal status: INITIAL  4.  Pt will report RPQ-13 symptoms to a 28 or less in order to demo improvement in symptoms after concussion.  Baseline: 33 Goal status: INITIAL    ASSESSMENT:  CLINICAL IMPRESSION: Skilled PT session emphasized creation of initial HEP and completion of vestibular assessment. Patient limited by migraine symptoms but requested to trial exercises regardless. Patient reported that her migraine did not get worse over course of session. Exotropia of L eye noted with convergence exercise and demonstrated challenge with VOR esepcially horizontal plain. Will continue to try to progress as tolerated but may need to modify POC pending patient response to PT exercises and HEP.   OBJECTIVE IMPAIRMENTS: decreased activity tolerance, increased muscle spasms, and pain.   ACTIVITY LIMITATIONS:  pt with symptoms with everyday tasks   PARTICIPATION LIMITATIONS: community activity, occupation, and pt going to look into FMLA   PERSONAL FACTORS: Past/current experiences, Profession, Time since onset of injury/illness/exacerbation, and 1-2 comorbidities: recent concussion, Hx of migraines, cochlear implant surgery (had it removed due to complications October 2024)  are also affecting patient's functional outcome.   REHAB POTENTIAL: Good  CLINICAL DECISION MAKING: Evolving/moderate complexity  EVALUATION COMPLEXITY: Moderate   PLAN:  PT FREQUENCY: 1x/week  PT DURATION: 8 weeks  PLANNED INTERVENTIONS: 97164- PT Re-evaluation, 97110-Therapeutic exercises, 97530- Therapeutic activity, 97112- Neuromuscular re-education, 97535- Self Care, [id number]- Manual therapy, Patient/Family education, Balance training, Dry Needling, Vestibular training, and Visual/preceptual  remediation/compensation  PLAN FOR NEXT SESSION: continue to work on occulomotor exercises/VOR progressions, could trial brock string, did patient hear back from eye doctor?, how was migraine with exercises    Lauraine DELENA Grumbling, PT, DPT 09/17/2023, 5:21 PM

## 2023-09-23 ENCOUNTER — Ambulatory Visit: Payer: 59 | Admitting: Physical Therapy

## 2023-09-23 ENCOUNTER — Telehealth: Payer: Self-pay | Admitting: *Deleted

## 2023-09-23 ENCOUNTER — Encounter: Payer: Self-pay | Admitting: Physical Therapy

## 2023-09-23 DIAGNOSIS — R42 Dizziness and giddiness: Secondary | ICD-10-CM | POA: Insufficient documentation

## 2023-09-23 DIAGNOSIS — M542 Cervicalgia: Secondary | ICD-10-CM | POA: Insufficient documentation

## 2023-09-23 DIAGNOSIS — S060X9A Concussion with loss of consciousness of unspecified duration, initial encounter: Secondary | ICD-10-CM | POA: Insufficient documentation

## 2023-09-23 DIAGNOSIS — R41841 Cognitive communication deficit: Secondary | ICD-10-CM | POA: Insufficient documentation

## 2023-09-23 NOTE — Therapy (Signed)
 OUTPATIENT PHYSICAL THERAPY VESTIBULAR TREATMENT- ARRIVED NO CHARGE     Patient Name: Donna Davis MRN: [id number] DOB:10/29/1970, 53 y.o., female Today's Date: 09/23/2023  END OF SESSION:  PT End of Session - 09/23/23 1531     Visit Number 2   arrived no charge   Number of Visits 5    Date for PT Re-Evaluation 11/11/23    Authorization Type BCBS    PT Start Time 1530    PT Stop Time 1543   arrived no charge due to severity of migraine   PT Time Calculation (min) 13 min    Equipment Utilized During Treatment --    Activity Tolerance Treatment limited secondary to medical complications (Comment)   migraine   Behavior During Therapy [facility] [place] for tasks assessed/performed             Past Medical History:  Diagnosis Date   Amitriptyline  adverse reaction 04/08/2013   Anxiety    Clostridium difficile infection    x several times   Deaf    uses sign language   Endometriosis    s/p TAH, LSO   Family history of malignant neoplasm of gastrointestinal tract    Gallstones    GERD (gastroesophageal reflux disease)    Hearing difficulty    b/l, 2/2 congenital rubella s/p stapedectomy. Using hearing aid   Hepatic cyst    6 mm on CT done done in 05/2005   Hypothyroidism    IBS (irritable bowel syndrome)    Migraines    frequency depends on my stress level (07/23/2017)   Obesity    PONV (postoperative nausea and vomiting)    sometimes; depends on how long I'm under   Shingles 03/2013   Past Surgical History:  Procedure Laterality Date   ABDOMINAL ADHESION SURGERY     Enterolysis   CESAREAN SECTION  1994   CHOLECYSTECTOMY N/A 06/26/2013   Procedure: LAPAROSCOPIC CHOLECYSTECTOMY WITH ATTEMPTED INTRAOPERATIVE CHOLANGIOGRAM;  Surgeon: Vicenta DELENA Poli, MD;  Location: [facility];  Service: General;  Laterality: N/A;   COCHLEAR IMPLANT  03/30/2016   COCHLEAR IMPLANT Right 03/2015   COLONOSCOPY WITH PROPOFOL   04/23/2012   DIAGNOSTIC LAPAROSCOPY  01/14/2006   I've had several  (07/23/2017)   ESOPHAGOGASTRODUODENOSCOPY (EGD) WITH PROPOFOL   04/23/2012   JOINT REPLACEMENT     KNEE SURGERY Right 2014   LAPAROSCOPIC OVARIAN CYSTECTOMY Right 04/16/2000   SALPINGOOPHORECTOMY Left    STAPEDECTOMY Left 05/14/2006   Thyroid  Nodule removed  1997   TONSILLECTOMY     TOTAL ABDOMINAL HYSTERECTOMY  01/13/2004   Endometriosis with residual right ovary. Multiple GYN surgeries for chronic pelvic pain; had LSO in past   TUBAL LIGATION     WOUND EXPLORATION Right 10/16/2016   Procedure: RIGHT EXPLORATION OF LACERATION OF INDEX FINGER;  Surgeon: Franky Curia, MD;  Location: [facility];  Service: Orthopedics;  Laterality: Right;   Patient Active Problem List   Diagnosis Date Noted   Concussion with loss of consciousness 08/27/2023   Elevated blood pressure reading 07/17/2021   COVID-19 long hauler manifesting chronic fatigue 05/16/2021   Arthralgia of right temporomandibular joint 08/26/2018   Endometriosis 04/09/2018   Allergic rhinitis due to allergen 12/19/2016   Bilateral deafness 01/17/2016   Family history of breast cancer in mother 08/26/2015   Obesity (BMI 30-39.9) 02/18/2014   Migraine 12/02/2012   Routine adult health maintenance 09/01/2012   Anxiety state 09/03/2006   Hypothyroidism 07/01/2006   GERD 07/01/2006   IBS 07/01/2006  PCP: [name], MD   REFERRING PROVIDER:  Joane Artist RAMAN, MD  REFERRING DIAG: S06.0X9A (ICD-10-CM) - Concussion with loss of consciousness, initial encounter R42 (ICD-10-CM) - Dizzy H53.9 (ICD-10-CM) - Vision changes  THERAPY DIAG:  Concussion with loss of consciousness, initial encounter  Dizziness and giddiness  Cervicalgia  ONSET DATE: 09/04/2023  Rationale for Evaluation and Treatment: Rehabilitation  SUBJECTIVE:   SUBJECTIVE STATEMENT: Stopped taking the topiramate . Feeling hungry again. Notes some things improved and other did not. Has not had a break in her migraine yet. Has been having brain fog.  Contacted her supervisor this morning and she will need to go on FMLA. Because of her migraine, when she is looking at therapist she is seeing flashing lights. Reports the flashing lights is new for her, it is not normal with her migraines. Has been trying the exercises every day at home. Has noticed no change after doing her exercises. Has not gotten scheduled with the eye doctor yet, left messages on getting scheduled and still hasn't heard back from them yet to get scheduled. Plans to walk into her PCP office after session today to see if they can help with the migraines. Reports migraine is a 10/10 today (initially reporting 20/10).   Pt accompanied by: self, in-person ASL interpreter Burnard  PERTINENT HISTORY: Per Dr. Joane: head injury. +LOC. Pt was on an inversion table and the leg stabilizer piece broke off, causing her to slide off. Pt was seen at the [facility] ED following the injury. Hx of migraines, cochlear implant surgery (had it removed due to complications October 2024)   Today, pt reports cont'd memory and focus. Notes more nausea over the past 2 hours, did vomit yesterday. Also notes black spotty peripheral vision. Does have trouble with balance but does not feel that it is any worse than prior to the injury. Becoming frustrated at difficulty completing single/simple tasks. Notes worsening drowsiness over the past 24 hours. Notes falling asleep fine but unable to stay asleep, which is very unusual for her. Also notes having cochlear implant removed about 3 weeks ago, prior to injury, but does feel that injury was related to this.   PAIN:  Are you having pain? Yes: NPRS scale: 10/10 Pain location: Head Pain description: Migraines  Aggravating factors: Bright lights,  Relieving factors: Nothing works for migraines   PRECAUTIONS: None  WEIGHT BEARING RESTRICTIONS: No  FALLS: Has patient fallen in last 6 months? Yes. Number of falls many many falls before cochlear implant was  removed, since it was removed in October, has had no issues with balance at all.    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  DIAGNOSTIC FINDINGS: MRI brain 09/07/23: IMPRESSION: 1.  No evidence of an acute intracranial abnormality. 2. Partially empty sella turcica. This finding can reflect incidental anatomic variation, or alternatively, it can be associated with chronic idiopathic intracranial hypertension (pseudotumor cerebri). 3. Otherwise unremarkable non-contrast MRI appearance of the brain. 4. Mild paranasal sinus disease as described. 5. Prior right mastoidectomy. Scattered fluid within remaining right mastoid air cells.    CT cervical spine 08/25/23: IMPRESSION: 1. No CT evidence of intracranial injury. 2. No acute fracture or traumatic subluxation of the cervical spine.   COGNITION:  Overall cognitive status: Impaired and with short term memory, focusing due to recent concussion                                                                                                                           TREATMENT DATE:   Session performed in dim room per patient tolerance   Arrived no charge due to severity of migraine.    PATIENT EDUCATION: Education details: See clinical assessment statement.  Person educated: Patient Education method: Explanation Education comprehension: verbalized understanding and needs further education  HOME EXERCISE PROGRAM: Access Code: [email or web address] URL: [email or web address] Date: 09/17/2023 Prepared by: Lauraine Grumbling  Exercises - Seated Horizontal Saccades  - 1 x daily - 7 x weekly - 3 sets - 5-10 reps - Seated Vertical Saccades   - 1 x daily - 7 x weekly - 3 sets - 5-10 reps - Pencil Pushups  - 1 x daily - 7 x weekly - 3 sets - 10 reps  Educated on target goals but to modify as needed for eye fatigue.   Gaze Stabilization: Sitting    Keeping eyes on target on wall 5 feet away, tilt head down 15-30 and move head side to side for 30 seconds if tolerated. Repeat while moving head up and down for 30 seconds if tolerated. Do up to 5-6 sessions per day as tolerated. Repeat using target on pattern background.   Encouraged 1 daily walk with sunglasses as patient required sunglasses prior to injury due to migraines being triggered by sunlight  GOALS: Goals reviewed with patient? Yes  SHORT TERM GOALS: ALL STGS = LTGS  LONG TERM GOALS: Target date: 10/10/2023  Pt will be independent with final HEP for vestibular/oculomotor deficits in order to build upon functional gains made in therapy.  Baseline: Provided on 12/31 Goal status: INITIAL  2.  MSQ to be assessed with goal written.  Baseline: 0 Goal status: Discontinued as no reports of dizziness  3.  Pt will report RPQ-3 symptoms to a 5 or less in order to demo improvement in symptoms after concussion.  Baseline: 8 Goal status: INITIAL  4.  Pt will report RPQ-13 symptoms to a 28 or less in order to demo improvement in symptoms after concussion.  Baseline: 33 Goal status: INITIAL    ASSESSMENT:  CLINICAL IMPRESSION:   Pt reporting worsening migraine since she was last here. Pt stopped taking her topiramate  (Dr. Joane is aware). Pt plans on going to PCP office after this as a walk in to see if there is anything else she can do for migraine management. Pt spoke to her supervisor today about going on FMLA. Pt reporting migraine as 10/10 (initially reported 20/10). Discussed that due to severity of migraine and pt's migraines currently being uncontrolled that PT would not be appropriate at this date. Pt was given oculomotor exercises at previous session and  reports no difference. Discussed pt following up with her PCP and Dr. Joane for migraine management, as  PT is limited at this point when migraines are uncontrolled. Discussed if next week if still having a severe migraine, then can call and cancel appt. Pt verbalized understanding and in agreement with plan.   OBJECTIVE IMPAIRMENTS: decreased activity tolerance, increased muscle spasms, and pain.   ACTIVITY LIMITATIONS:  pt with symptoms with everyday tasks   PARTICIPATION LIMITATIONS: community activity, occupation, and pt going to look into FMLA   PERSONAL FACTORS: Past/current experiences, Profession, Time since onset of injury/illness/exacerbation, and 1-2 comorbidities: recent concussion, Hx of migraines, cochlear implant surgery (had it removed due to complications October 2024)  are also affecting patient's functional outcome.   REHAB POTENTIAL: Good  CLINICAL DECISION MAKING: Evolving/moderate complexity  EVALUATION COMPLEXITY: Moderate   PLAN:  PT FREQUENCY: 1x/week  PT DURATION: 8 weeks  PLANNED INTERVENTIONS: 97164- PT Re-evaluation, 97110-Therapeutic exercises, 97530- Therapeutic activity, 97112- Neuromuscular re-education, 97535- Self Care, [id number]- Manual therapy, Patient/Family education, Balance training, Dry Needling, Vestibular training, and Visual/preceptual remediation/compensation  PLAN FOR NEXT SESSION: continue to work on occulomotor exercises/VOR progressions, could trial brock string, did patient hear back from eye doctor?, how was migraine with exercises    [place] LOISE Senate, PT, DPT 09/23/2023, 3:48 PM

## 2023-09-23 NOTE — Telephone Encounter (Signed)
 Pt walked in to the office. Stated she has been having migraines since the concussion in December but now she's having flashing lights which is new and she's very concern. She called [facility] twice and was told they will call her back on this past Friday with an appt but she has not heard from their office. Today, she has taken 1 Tramadol  and Tylenol  this am then Advil  at lunch. She only has 3 Tramadol  pills left which is why she has not taken anymore this afternoon. I called [facility] but office is closed but I left a message for them to call pt. Stated she does not know what else to do.

## 2023-09-24 ENCOUNTER — Other Ambulatory Visit: Payer: Self-pay | Admitting: Internal Medicine

## 2023-09-24 MED ORDER — TRAMADOL HCL 50 MG PO TABS: ORAL_TABLET | ORAL | 0 refills | Status: DC

## 2023-09-24 NOTE — Telephone Encounter (Signed)
 Next appt scheduled 10/22/23 with PCP.

## 2023-09-24 NOTE — Telephone Encounter (Signed)
 Pt called and stated she has an appt at Dr Arnold Dixon [facility] office.

## 2023-09-25 LAB — HM DIABETES EYE EXAM

## 2023-09-27 ENCOUNTER — Other Ambulatory Visit: Payer: Self-pay | Admitting: Internal Medicine

## 2023-09-27 DIAGNOSIS — U071 COVID-19: Secondary | ICD-10-CM

## 2023-09-30 ENCOUNTER — Ambulatory Visit: Payer: 59 | Admitting: Family Medicine

## 2023-09-30 VITALS — BP 128/86 | HR 85 | Ht 60.0 in | Wt 190.0 lb

## 2023-09-30 DIAGNOSIS — G43719 Chronic migraine without aura, intractable, without status migrainosus: Secondary | ICD-10-CM | POA: Diagnosis not present

## 2023-09-30 DIAGNOSIS — H539 Unspecified visual disturbance: Secondary | ICD-10-CM | POA: Diagnosis not present

## 2023-09-30 DIAGNOSIS — S060X9D Concussion with loss of consciousness of unspecified duration, subsequent encounter: Secondary | ICD-10-CM

## 2023-09-30 DIAGNOSIS — R42 Dizziness and giddiness: Secondary | ICD-10-CM

## 2023-09-30 MED ORDER — SUMATRIPTAN SUCCINATE 50 MG PO TABS: 50.0000 mg | ORAL_TABLET | ORAL | 0 refills | Status: DC | PRN

## 2023-09-30 NOTE — Progress Notes (Signed)
 Subjective:   I, Ileana Collet, PhD, LAT, ATC acting as a scribe for Artist Lloyd, MD.  Chief Complaint: Donna Davis,  is a 53 y.o. female who presents for f/u concussion w/ LOC. Pt was on an inversion table and the leg stabilizer piece broke off, causing her to slide off. Pt was last seen by Dr. Lloyd and 09/04/23 and was prescribed Trokendi  and was referred to vestibular therapy, completing 2 visits.  Today, pt reports she did not tolerate the topiramate  due to significant side effects. She has instead just been taking Tylenol  and IBU. She c/o cont'd photophobia, HA, and confusion. She notes she will have good days and bad.   Dx imaging: 08/25/23 Head & c-spine CT   Injury date: 08/25/23 Visit #: 2  History of Present Illness:   Concussion Self-Reported Symptom Score Symptoms rated on a scale 1-6, in last 24 hours  Headache: 6   Pressure in head: 6 Neck pain: 1 Nausea or vomiting: 3 Dizziness: 2  Blurred vision: 5  Balance problems: 0 Sensitivity to light:  6 Sensitivity to noise: 0 Feeling slowed down: 6 Feeling like "in a fog": 6 Don't feel right": 6 Difficulty concentrating: 6 Difficulty remembering: 6 Fatigue or low energy: 6 Confusion: 6 Drowsiness: 6 More emotional: 0 Irritability: 1 Sadness: 0 Nervous or anxious: 0 Trouble falling asleep: 0   Total # of Symptoms: 16/22 Total Symptom Score: 78/132  Previous Total # of Symptoms: 20/22 Previous Symptom Score: 98/132  Tinnitus: No  Review of Systems:  No fever or chills    Review of History: History of migraine headaches.  Deaf  Objective:    Physical Examination Vitals:   09/30/23 1107  BP: 128/86  Pulse: 85  SpO2: 96%   MSK: Normal cervical motion Neuro: Alert and oriented normal coordination.  Light sensitivity present. Psych: Normal speech thought process and affect.     Assessment and Plan   53 y.o. female with concussion complicated by migraine headaches and cognitive  slowing.  Headaches exacerbation of migraine type worsening.  Unfortunately she has tried multiple different controlled medications including amitriptyline  and propranolol  in the past and could not tolerate them.  We tried Topiramate  rate but this caused worsening cognitive symptoms it was discontinued.  She had a very severe headache recently.  She does not think that she is ever taken triptan type medications.  Plan for Imitrex  or other similar triptan.  Considered CGRP class of medicine in the future as well.  Cognitive challenging.  Speech therapy was ordered and will start on the 21st of this month.  Vision and vestibular not doing well either.  Currently engaged with vestibular physical therapy and has already seen her optometrist/ophthalmologist and is getting a new pair glasses.  Work: Currently working.  Completed FMLA paperwork today to allow for intermittent leave as needed.    Recheck in 3 weeks. Today's visit was conducted using a side language interpreter.    Action/Discussion: Reviewed diagnosis, management options, expected outcomes, and the reasons for scheduled and emergent follow-up. Questions were adequately answered. Patient expressed verbal understanding and agreement with the following plan.     Patient Education: Reviewed with patient the risks (i.e, a repeat concussion, post-concussion syndrome, second-impact syndrome) of returning to play prior to complete resolution, and thoroughly reviewed the signs and symptoms of concussion.Reviewed need for complete resolution of all symptoms, with rest AND exertion, prior to return to play. Reviewed red flags for urgent medical evaluation: worsening symptoms, nausea/vomiting, intractable headache,  musculoskeletal changes, focal neurological deficits. [facility] Concussion Care Plan, which clearly outlines the plans stated above, was given to patient.   Level of service: Total encounter time 30 minutes including  face-to-face time with the patient and, reviewing past medical record, and charting on the date of service.        After Visit Summary printed out and provided to patient as appropriate.  The above documentation has been reviewed and is accurate and complete Artist Lloyd

## 2023-09-30 NOTE — Patient Instructions (Signed)
 Thank you for coming in today.   Recheck in 3 weeks.   Let me know if I should change the medicine.

## 2023-10-01 ENCOUNTER — Ambulatory Visit: Payer: 59 | Attending: Family Medicine | Admitting: Physical Therapy

## 2023-10-01 ENCOUNTER — Encounter: Payer: Self-pay | Admitting: Physical Therapy

## 2023-10-01 DIAGNOSIS — R42 Dizziness and giddiness: Secondary | ICD-10-CM | POA: Diagnosis present

## 2023-10-01 DIAGNOSIS — M542 Cervicalgia: Secondary | ICD-10-CM | POA: Diagnosis present

## 2023-10-01 DIAGNOSIS — S060X9A Concussion with loss of consciousness of unspecified duration, initial encounter: Secondary | ICD-10-CM | POA: Diagnosis present

## 2023-10-01 DIAGNOSIS — R41841 Cognitive communication deficit: Secondary | ICD-10-CM | POA: Diagnosis present

## 2023-10-01 NOTE — Therapy (Signed)
 OUTPATIENT PHYSICAL THERAPY VESTIBULAR TREATMENT    Patient Name: Donna Davis MRN: [id number] DOB:[birth year], 53 y.o., female Today's Date: 10/02/2023  END OF SESSION:  PT End of Session - 10/01/23 1617     Visit Number 3    Number of Visits 5    Date for PT Re-Evaluation 11/11/23    Authorization Type BCBS    PT Start Time 1615    PT Stop Time 1655    PT Time Calculation (min) 40 min    Activity Tolerance Treatment limited secondary to medical complications (Comment);Patient tolerated treatment well   migraine   Behavior During Therapy [facility] for tasks assessed/performed             Past Medical History:  Diagnosis Date   Amitriptyline  adverse reaction 04/08/2013   Anxiety    Clostridium difficile infection    x several times   Deaf    uses sign language   Endometriosis    s/p TAH, LSO   Family history of malignant neoplasm of gastrointestinal tract    Gallstones    GERD (gastroesophageal reflux disease)    Hearing difficulty    b/l, 2/2 congenital rubella s/p stapedectomy. Using hearing aid   Hepatic cyst    6 mm on CT done done in 05/2005   Hypothyroidism    IBS (irritable bowel syndrome)    Migraines    frequency depends on my stress level (07/23/2017)   Obesity    PONV (postoperative nausea and vomiting)    sometimes; depends on how long I'm under   Shingles 03/2013   Past Surgical History:  Procedure Laterality Date   ABDOMINAL ADHESION SURGERY     Enterolysis   CESAREAN SECTION  1994   CHOLECYSTECTOMY N/A 06/26/2013   Procedure: LAPAROSCOPIC CHOLECYSTECTOMY WITH ATTEMPTED INTRAOPERATIVE CHOLANGIOGRAM;  Surgeon: Vicenta DELENA Poli, MD;  Location: [facility];  Service: General;  Laterality: N/A;   COCHLEAR IMPLANT  03/30/2016   COCHLEAR IMPLANT Right 03/2015   COLONOSCOPY WITH PROPOFOL   04/23/2012   DIAGNOSTIC LAPAROSCOPY  01/14/2006   I've had several (07/23/2017)   ESOPHAGOGASTRODUODENOSCOPY (EGD) WITH PROPOFOL   04/23/2012   JOINT REPLACEMENT      KNEE SURGERY Right 2014   LAPAROSCOPIC OVARIAN CYSTECTOMY Right 04/16/2000   SALPINGOOPHORECTOMY Left    STAPEDECTOMY Left 05/14/2006   Thyroid  Nodule removed  1997   TONSILLECTOMY     TOTAL ABDOMINAL HYSTERECTOMY  01/13/2004   Endometriosis with residual right ovary. Multiple GYN surgeries for chronic pelvic pain; had LSO in past   TUBAL LIGATION     WOUND EXPLORATION Right 10/16/2016   Procedure: RIGHT EXPLORATION OF LACERATION OF INDEX FINGER;  Surgeon: Franky Curia, MD;  Location: [facility];  Service: Orthopedics;  Laterality: Right;   Patient Active Problem List   Diagnosis Date Noted   Concussion with loss of consciousness 08/27/2023   Elevated blood pressure reading 07/17/2021   COVID-19 long hauler manifesting chronic fatigue 05/16/2021   Arthralgia of right temporomandibular joint 08/26/2018   Endometriosis 04/09/2018   Allergic rhinitis due to allergen 12/19/2016   Bilateral deafness 01/17/2016   Family history of breast cancer in mother 08/26/2015   Obesity (BMI 30-39.9) 02/18/2014   Migraine 12/02/2012   Routine adult health maintenance 09/01/2012   Anxiety state 09/03/2006   Hypothyroidism 07/01/2006   GERD 07/01/2006   IBS 07/01/2006    PCP: [name], MD   REFERRING PROVIDER:  Joane Artist RAMAN, MD  REFERRING DIAG: S06.0X9A (ICD-10-CM) - Concussion with loss  of consciousness, initial encounter R42 (ICD-10-CM) - Dizzy H53.9 (ICD-10-CM) - Vision changes  THERAPY DIAG:  Dizziness and giddiness  Cervicalgia  ONSET DATE: 09/04/2023  Rationale for Evaluation and Treatment: Rehabilitation  SUBJECTIVE:   SUBJECTIVE STATEMENT: Saw Dr. Joane yesterday. He signed off on FMLA paperwork and it was approved today. Trying Imitrex  for migraines. Will see Dr. Joane again in 3 weeks. Took 2 yesterday and took 1 about 2 hours ago. Reports migraine is still a 10/10. Noticed medication yesterday made her feel more sleepy. Treated in dimly lit treatment room due  to light sensitivity. Saw the eye doctor last week and was told to incr reading glass strength. Reports can read a little bit better with them. Pt denies any neck tightness or stiffness. Pt reporting down to try exercises despite bad migraine. Pt planning on ordering rose glasses for screens and migraines.    Pt accompanied by: self, in-person ASL interpreter Burnard  PERTINENT HISTORY: Per Dr. Joane: head injury. +LOC. Pt was on an inversion table and the leg stabilizer piece broke off, causing her to slide off. Pt was seen at the [facility] ED following the injury. Hx of migraines, cochlear implant surgery (had it removed due to complications October 2024)   Today, pt reports cont'd memory and focus. Notes more nausea over the past 2 hours, did vomit yesterday. Also notes black spotty peripheral vision. Does have trouble with balance but does not feel that it is any worse than prior to the injury. Becoming frustrated at difficulty completing single/simple tasks. Notes worsening drowsiness over the past 24 hours. Notes falling asleep fine but unable to stay asleep, which is very unusual for her. Also notes having cochlear implant removed about 3 weeks ago, prior to injury, but does feel that injury was related to this.   PAIN:  Are you having pain? Yes: NPRS scale: 10/10 Pain location: Head Pain description: Migraines  Aggravating factors: Bright lights,  Relieving factors: Nothing works for migraines   PRECAUTIONS: None  WEIGHT BEARING RESTRICTIONS: No  FALLS: Has patient fallen in last 6 months? Yes. Number of falls many many falls before cochlear implant was removed, since it was removed in October, has had no issues with balance at all.    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  DIAGNOSTIC FINDINGS: MRI brain 09/07/23: IMPRESSION: 1.  No evidence of an acute intracranial abnormality. 2. Partially empty sella turcica. This finding can reflect incidental anatomic variation, or alternatively, it can be associated with chronic idiopathic intracranial hypertension (pseudotumor cerebri). 3. Otherwise unremarkable non-contrast MRI appearance of the brain. 4. Mild paranasal sinus disease as described. 5. Prior right mastoidectomy. Scattered fluid within remaining right mastoid air cells.    CT cervical spine 08/25/23: IMPRESSION: 1. No CT evidence of intracranial injury. 2. No acute fracture or traumatic subluxation of the cervical spine.   COGNITION: Overall cognitive status: Impaired and with short term memory, focusing due to recent concussion  TREATMENT DATE:   VESTIBULAR TREATMENT:   Performed in dimly lit treatment room due to light sensitivity:   Seated Smooth Pursuits Horizontal direction: 2 x 10 reps,feels like the room is moving Vertical direction: 2 x 10 reps,feels like the room is moving Pt reporting no dizziness, added this exercise to pt's HEP  Seated Hart Chart for Saccades In Horizontal direction: 2 x 5 reps/lines, pt reporting eye fatigue, during 2nd set, pt having difficulty seeing some of the letters   Gaze Adaptation: x1 Viewing Horizontal: Position: Seated, Time: 30 seconds, Reps: 2 , and Comment: pt reporting difficulty, felt like eyes had trouble working together, cues for proper ROM  Attempted in standing for 2 reps, with pt having difficulty, only able to perform for 15 seconds, reports X looked like a V, had to hold onto the door for balance. Discussed continuing to perform in seated at this time.   Pt reporting more  pressure behind R eyeball at end of session.   PATIENT EDUCATION: Education details: Went over getting rose glasses for screen time for work/migraines - pt going to work on getting these, progressed HEP for saccades and added in smooth pursuits, answered pt's questions in regards to concussion symptoms, pt reporting that her eyes had difficulty working together with VOR tasks. Discussed possibility in the future of vision therapy for visual deficits s/p concussion if pt continues to have visual limitations after working with vestibular PT  Person educated: Patient Education method: Programmer, Multimedia, Demonstration, Verbal cues, and Handouts Education comprehension: verbalized understanding, returned demonstration, and needs further education  HOME EXERCISE PROGRAM:  Progressed seated saccades to seated [employer] Code: [email or web address] URL: [email or web address] Date: 10/01/2023 Prepared by: Sheffield Senate  Exercises - Seated Horizontal Saccades  - 1 x daily - 7 x weekly - 3 sets - 5-10 reps - Seated Vertical Saccades  - 1 x daily - 7 x weekly - 3 sets - 5-10 reps - Pencil Pushups  - 1 x daily - 7 x weekly - 3 sets - 10 reps - Seated Horizontal Smooth Pursuit  - 1 x daily - 5 x weekly - 2 sets - 10 reps - Seated Vertical Smooth Pursuit  - 1 x daily - 5 x weekly - 2 sets - 10 reps  Educated on target goals but to modify as needed for eye fatigue.   Gaze Stabilization: Sitting    Keeping eyes on target on wall 5 feet away, tilt head down 15-30 and move head side to side for 30 seconds if tolerated. Repeat while moving head up and down for 30 seconds if tolerated. Do up to 5-6 sessions per day as tolerated. Repeat using target on pattern background.   Encouraged 1 daily walk with sunglasses as patient required sunglasses prior to injury due to migraines being triggered by sunlight  GOALS: Goals reviewed with patient? Yes  SHORT TERM GOALS: ALL STGS = LTGS  LONG TERM  GOALS: Target date: 10/10/2023  Pt will be independent with final HEP for vestibular/oculomotor deficits in order to build upon functional gains made in therapy.  Baseline: Provided on 12/31 Goal status: INITIAL  2.  MSQ to be assessed with goal written.  Baseline: 0 Goal status: Discontinued as no reports of dizziness  3.  Pt will report RPQ-3 symptoms to a 5 or less in order to demo improvement in symptoms after concussion.  Baseline: 8 Goal status: INITIAL  4.  Pt will report RPQ-13 symptoms to a 28 or less  in order to demo improvement in symptoms after concussion.  Baseline: 33 Goal status: INITIAL    ASSESSMENT:  CLINICAL IMPRESSION:    Pt reporting still 10/10 migraine today, but willing to try exercises in therapy. Pt saw Dr. Joane yesterday and was started on Imitrex . Treatment performed today in dimly lit room due to light sensitivity. Today's skilled session focused on progressing oculomotor/vestibular exercises. Added seated smooth pursuits to HEP. Progressed seated saccades to use of [employer]. Performed 2 sets with pt having more difficulty on the 2nd set due to eye fatigue. Pt reports this is what she has to do for work when moving eyes from screen to screen. Added to pt's HEP. Will continue to progress towards LTGs as able.    OBJECTIVE IMPAIRMENTS: decreased activity tolerance, increased muscle spasms, and pain.   ACTIVITY LIMITATIONS:  pt with symptoms with everyday tasks   PARTICIPATION LIMITATIONS: community activity, occupation, and pt going to look into FMLA   PERSONAL FACTORS: Past/current experiences, Profession, Time since onset of injury/illness/exacerbation, and 1-2 comorbidities: recent concussion, Hx of migraines, cochlear implant surgery (had it removed due to complications October 2024)  are also affecting patient's functional outcome.   REHAB POTENTIAL: Good  CLINICAL DECISION MAKING: Evolving/moderate complexity  EVALUATION COMPLEXITY:  Moderate   PLAN:  PT FREQUENCY: 1x/week  PT DURATION: 8 weeks  PLANNED INTERVENTIONS: 97164- PT Re-evaluation, 97110-Therapeutic exercises, 97530- Therapeutic activity, 97112- Neuromuscular re-education, 97535- Self Care, [id number]- Manual therapy, Patient/Family education, Balance training, Dry Needling, Vestibular training, and Visual/preceptual remediation/compensation  PLAN FOR NEXT SESSION: how is migraine doing? Check goals, do we want to add more appts?   continue to work on occulomotor exercises/VOR progressions, could trial brock string,  how was migraine with exercises    Sheffield LOISE Senate, PT, DPT 10/02/2023, 8:58 AM

## 2023-10-02 ENCOUNTER — Encounter: Payer: Self-pay | Admitting: Family Medicine

## 2023-10-07 ENCOUNTER — Encounter: Payer: Self-pay | Admitting: Physical Therapy

## 2023-10-07 ENCOUNTER — Ambulatory Visit: Payer: 59 | Admitting: Physical Therapy

## 2023-10-07 DIAGNOSIS — M542 Cervicalgia: Secondary | ICD-10-CM

## 2023-10-07 DIAGNOSIS — R42 Dizziness and giddiness: Secondary | ICD-10-CM | POA: Diagnosis not present

## 2023-10-07 DIAGNOSIS — S060X9A Concussion with loss of consciousness of unspecified duration, initial encounter: Secondary | ICD-10-CM

## 2023-10-07 NOTE — Therapy (Signed)
OUTPATIENT PHYSICAL THERAPY VESTIBULAR TREATMENT / DISCHARGE    Patient Name: Donna Davis MRN: [id number] DOB:19-Feb-1971, 53 y.o., female Today's Date: 10/07/2023  PHYSICAL THERAPY DISCHARGE SUMMARY  Visits from Start of Care: 4  Current functional level related to goals / functional outcomes: See below   Remaining deficits: Light sensitivity and migraines   Education / Equipment: Continue HEP   Patient agrees to discharge. Patient goals were partially met. Patient is being discharged due to maximized rehab potential.   END OF SESSION:  PT End of Session - 10/07/23 1606     Visit Number 4    Number of Visits 5    Authorization Type BCBS    PT Start Time 1603    PT Stop Time 1623    PT Time Calculation (min) 20 min    Equipment Utilized During Treatment Other (comment)    Activity Tolerance Treatment limited secondary to medical complications (Comment);Patient tolerated treatment well    Behavior During Therapy [facility] for tasks assessed/performed             Past Medical History:  Diagnosis Date   Amitriptyline adverse reaction 04/08/2013   Anxiety    Clostridium difficile infection    x several times   Deaf    uses sign language   Endometriosis    s/p TAH, LSO   Family history of malignant neoplasm of gastrointestinal tract    Gallstones    GERD (gastroesophageal reflux disease)    Hearing difficulty    b/l, 2/2 congenital rubella s/p stapedectomy. Using hearing aid   Hepatic cyst    6 mm on CT done done in 05/2005   Hypothyroidism    IBS (irritable bowel syndrome)    Migraines    "frequency depends on my stress level" (07/23/2017)   Obesity    PONV (postoperative nausea and vomiting)    "sometimes; depends on how long I'm under"   Shingles 03/2013   Past Surgical History:  Procedure Laterality Date   ABDOMINAL ADHESION SURGERY     Enterolysis   CESAREAN SECTION  1994   CHOLECYSTECTOMY N/A 06/26/2013   Procedure: LAPAROSCOPIC CHOLECYSTECTOMY WITH  ATTEMPTED INTRAOPERATIVE CHOLANGIOGRAM;  Surgeon: Shelly Rubenstein, MD;  Location: [facility];  Service: General;  Laterality: N/A;   COCHLEAR IMPLANT  03/30/2016   COCHLEAR IMPLANT Right 03/2015   COLONOSCOPY WITH PROPOFOL  04/23/2012   DIAGNOSTIC LAPAROSCOPY  01/14/2006   "I've had several" (07/23/2017)   ESOPHAGOGASTRODUODENOSCOPY (EGD) WITH PROPOFOL  04/23/2012   JOINT REPLACEMENT     KNEE SURGERY Right 2014   LAPAROSCOPIC OVARIAN CYSTECTOMY Right 04/16/2000   SALPINGOOPHORECTOMY Left    STAPEDECTOMY Left 05/14/2006   Thyroid Nodule removed  1997   TONSILLECTOMY     TOTAL ABDOMINAL HYSTERECTOMY  01/13/2004   Endometriosis with residual right ovary. Multiple GYN surgeries for chronic pelvic pain; had LSO in past   TUBAL LIGATION     WOUND EXPLORATION Right 10/16/2016   Procedure: RIGHT EXPLORATION OF LACERATION OF INDEX FINGER;  Surgeon: Betha Loa, MD;  Location: [facility];  Service: Orthopedics;  Laterality: Right;   Patient Active Problem List   Diagnosis Date Noted   Concussion with loss of consciousness 08/27/2023   Elevated blood pressure reading 07/17/2021   COVID-19 long hauler manifesting chronic fatigue 05/16/2021   Arthralgia of right temporomandibular joint 08/26/2018   Endometriosis 04/09/2018   Allergic rhinitis due to allergen 12/19/2016   Bilateral deafness 01/17/2016   Family history of breast cancer in  mother 08/26/2015   Obesity (BMI 30-39.9) 02/18/2014   Migraine 12/02/2012   Routine adult health maintenance 09/01/2012   Anxiety state 09/03/2006   Hypothyroidism 07/01/2006   GERD 07/01/2006   IBS 07/01/2006    PCP: Dickie La, MD   REFERRING PROVIDER:  Rodolph Bong, MD  REFERRING DIAG: S06.0X9A (ICD-10-CM) - Concussion with loss of consciousness, initial encounter R42 (ICD-10-CM) - Dizzy H53.9 (ICD-10-CM) - Vision changes  THERAPY DIAG:  Dizziness and giddiness  Cervicalgia  Concussion with loss of consciousness, initial  encounter  ONSET DATE: 09/04/2023  Rationale for Evaluation and Treatment: Rehabilitation  SUBJECTIVE:   SUBJECTIVE STATEMENT: Patient reports that her migraines have been notably improved starting today. She thinks time more than PT exercises have helped. She starts SLP next and is agreeable to PT discharge at this time as has not noticed major improvement with PT thus far. Is not taking new migraine medication. Denies falls and near falls since last here.   Pt accompanied by: self, in-person ASL interpreter Consuella Lose  PERTINENT HISTORY: Per Dr. Denyse Amass: head injury. +LOC. Pt was on an inversion table and the leg stabilizer piece broke off, causing her to slide off. Pt was seen at the [facility] ED following the injury. Hx of migraines, cochlear implant surgery (had it removed due to complications October 2024)   Today, pt reports cont'd memory and focus. Notes more nausea over the past 2 hours, did vomit yesterday. Also notes black spotty peripheral vision. Does have trouble with balance but does not feel that it is any worse than prior to the injury. Becoming frustrated at difficulty completing single/simple tasks. Notes worsening drowsiness over the past 24 hours. Notes falling asleep fine but unable to stay asleep, which is very unusual for her. Also notes having cochlear implant removed about 3 weeks ago, prior to injury, but does feel that injury was related to this.   PAIN:  Are you having pain? Yes: NPRS scale: 7/10 Pain location: Head Pain description: Migraines  Aggravating factors: Bright lights,  Relieving factors: Nothing works for migraines   PRECAUTIONS: None  WEIGHT BEARING RESTRICTIONS: No  FALLS: Has patient fallen in last 6 months? Yes. Number of falls many many falls before cochlear implant was removed, since it was removed in October, has had no issues with balance at all.    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  DIAGNOSTIC FINDINGS: MRI brain 09/07/23: IMPRESSION: 1.  No evidence of an acute intracranial abnormality. 2. Partially empty sella turcica. This finding can reflect incidental anatomic variation, or alternatively, it can be associated with chronic idiopathic intracranial hypertension (pseudotumor cerebri). 3. Otherwise unremarkable non-contrast MRI appearance of the brain. 4. Mild paranasal sinus disease as described. 5. Prior right mastoidectomy. Scattered fluid within remaining right mastoid air cells.    CT cervical spine 08/25/23: IMPRESSION: 1. No CT evidence of intracranial injury. 2. No acute fracture or traumatic subluxation of the cervical spine.   COGNITION: Overall cognitive status: Impaired and with short term memory, focusing due to recent concussion  TREATMENT DATE:   TherAct:   VESTIBULAR TREATMENT:   TherAct:   [place] Post Concussion Symptoms Questionnaire:  Compared to before the accident, do you now (last 24 hours) suffer from:  Headaches 3 = moderate problem  Feeling of Dizziness 1 = no more of a problem  Nausea and/or vomiting 3 = moderate problem  RPQ-3 (total of first 3 items) 7     Noise sensitivity (easily upset by loud noises) 0 = not experienced  Sleep disturbances 3 = moderate problem  Fatigue, tiring more easily 3 = moderate problem  Being irritable, easily angered: 1 = no more of a problem  Feeling depressed or tearful 0 = not experienced  Feeling frustrated or impatient 2 = mild problem  Forgetfulness, poor memory 3 = moderate problem  Poor concentration 3 = moderate  problem  Taking longer to think 3 = moderate problem  Blurred vision 3 = moderate problem  Light sensitivity (easily upset by bright light) 4 = severe problem  Double vision 0 = not experienced  Restlessness 0 = not experienced  RPQ-13 (total for next 13 items) 25    Verbally reviewed HEP and final exercise list; patient with no questions at this time and has all printouts saved   PATIENT EDUCATION: Education details: Reviewed progress, HEP, and D/C POC Person educated: Patient Education method: Explanation, Demonstration, Verbal cues, and Handouts Education comprehension: verbalized understanding and returned demonstration  HOME EXERCISE PROGRAM:  Progressed seated saccades to seated [employer] Code: [email or web address] URL: [email or web address] Date: 10/01/2023 Prepared by: Sherlie Ban  Exercises - Seated Horizontal Saccades  - 1 x daily - 7 x weekly - 3 sets - 5-10 reps - Seated Vertical Saccades  - 1 x daily - 7 x weekly - 3 sets - 5-10 reps - Pencil Pushups  - 1 x daily - 7 x weekly - 3 sets - 10 reps - Seated Horizontal Smooth Pursuit  - 1 x daily - 5 x weekly - 2 sets - 10 reps - Seated Vertical Smooth Pursuit  - 1 x daily - 5 x weekly - 2 sets - 10 reps  Educated on target goals but to modify as needed for eye fatigue.   Gaze Stabilization: Sitting    Keeping eyes on target on wall 5 feet away, tilt head down 15-30 and move head side to side for 30 seconds if tolerated. Repeat while moving head up and down for 30 seconds if tolerated. Do up to 5-6 sessions per day as tolerated. Repeat using target on pattern background.   Encouraged 1 daily walk with sunglasses as patient required sunglasses prior to injury due to migraines being triggered by sunlight  GOALS: Goals reviewed with patient? Yes  SHORT TERM GOALS: ALL STGS = LTGS  LONG TERM GOALS: Target date: 10/10/2023  Pt will be independent with final HEP for vestibular/oculomotor deficits  in order to build upon functional gains made in therapy.  Baseline: Provided on 12/31, reports independence  Goal status: MET  2.  MSQ to be assessed with goal written.  Baseline: 0 Goal status: Discontinued as no reports of dizziness  3.  Pt will report RPQ-3 symptoms to a 5 or less in order to demo improvement in symptoms after concussion.  Baseline: 8, improved to 7 Goal status: NOT MET  4.  Pt will report RPQ-13 symptoms to a 28 or less in order to demo improvement in symptoms after concussion.  Baseline: 33, improved to 25 Goal status: MET  ASSESSMENT:  CLINICAL IMPRESSION:   Pt discharging from skilled physical therapy services due to maximized rehab potential at this time. Patient reports improvements in migraine but due to time not therapy; possibly mild improved visual deficits with PT. Patient reports that she doesn't see a need for PT moving forward and is independent with HEP. Patient agreeable to discharge. Achieved 2/3 active LTGs.    OBJECTIVE IMPAIRMENTS: decreased activity tolerance, increased muscle spasms, and pain.   ACTIVITY LIMITATIONS:  pt with symptoms with everyday tasks   PARTICIPATION LIMITATIONS: community activity, occupation, and pt going to look into FMLA   PERSONAL FACTORS: Past/current experiences, Profession, Time since onset of injury/illness/exacerbation, and 1-2 comorbidities: recent concussion, Hx of migraines, cochlear implant surgery (had it removed due to complications October 2024)  are also affecting patient's functional outcome.   REHAB POTENTIAL: Good  CLINICAL DECISION MAKING: Evolving/moderate complexity  EVALUATION COMPLEXITY: Moderate   PLAN:  PT FREQUENCY: 1x/week  PT DURATION: 8 weeks  PLANNED INTERVENTIONS: 97164- PT Re-evaluation, 97110-Therapeutic exercises, 97530- Therapeutic activity, [id number]- Neuromuscular re-education, 97535- Self Care, [id number]- Manual therapy, Patient/Family education, Balance training, Dry  Needling, Vestibular training, and Visual/preceptual remediation/compensation  PLAN FOR NEXT SESSION: NA - D/C with updated POC    Carmelia Bake, PT, DPT 10/07/2023, 4:30 PM

## 2023-10-08 ENCOUNTER — Encounter: Payer: Self-pay | Admitting: Speech Pathology

## 2023-10-08 ENCOUNTER — Ambulatory Visit: Payer: 59 | Admitting: Speech Pathology

## 2023-10-08 DIAGNOSIS — R41841 Cognitive communication deficit: Secondary | ICD-10-CM

## 2023-10-08 DIAGNOSIS — R42 Dizziness and giddiness: Secondary | ICD-10-CM | POA: Diagnosis not present

## 2023-10-08 NOTE — Patient Instructions (Signed)
Strategies:   Self-Talk -- for those mundane every day activities where you're likely to forget, use self talk  E.g. "the water is off"   Checklists, questionnaires, calendar: put things in writing to help you remember. Using this can also help reassure you that you did the things you needed to complete.   Try and simplify when you can so you aren't bogged down with extra effort.  Manage your fatigue: Take 5 minute brain breaks throughout the day Schedule your day so cognitively demanding tasks are spaced out Make sure you have plenty of time for rest

## 2023-10-08 NOTE — Therapy (Signed)
OUTPATIENT SPEECH LANGUAGE PATHOLOGY EVALUATION   Patient Name: Donna Davis MRN: [id number] DOB:November 14, 1970, 53 y.o., female Today's Date: 10/08/2023  PCP: Dickie La, MD REFERRING PROVIDER: Rodolph Bong., MD  END OF SESSION:  End of Session - 10/08/23 0947     Visit Number 1    Number of Visits 5    Date for SLP Re-Evaluation 12/03/23    SLP Start Time 0845    SLP Stop Time  0931    SLP Time Calculation (min) 46 min    Activity Tolerance Patient tolerated treatment well             Past Medical History:  Diagnosis Date   Amitriptyline adverse reaction 04/08/2013   Anxiety    Clostridium difficile infection    x several times   Deaf    uses sign language   Endometriosis    s/p TAH, LSO   Family history of malignant neoplasm of gastrointestinal tract    Gallstones    GERD (gastroesophageal reflux disease)    Hearing difficulty    b/l, 2/2 congenital rubella s/p stapedectomy. Using hearing aid   Hepatic cyst    6 mm on CT done done in 05/2005   Hypothyroidism    IBS (irritable bowel syndrome)    Migraines    "frequency depends on my stress level" (07/23/2017)   Obesity    PONV (postoperative nausea and vomiting)    "sometimes; depends on how long I'm under"   Shingles 03/2013   Past Surgical History:  Procedure Laterality Date   ABDOMINAL ADHESION SURGERY     Enterolysis   CESAREAN SECTION  1994   CHOLECYSTECTOMY N/A 06/26/2013   Procedure: LAPAROSCOPIC CHOLECYSTECTOMY WITH ATTEMPTED INTRAOPERATIVE CHOLANGIOGRAM;  Surgeon: Shelly Rubenstein, MD;  Location: [facility];  Service: General;  Laterality: N/A;   COCHLEAR IMPLANT  03/30/2016   COCHLEAR IMPLANT Right 03/2015   COLONOSCOPY WITH PROPOFOL  04/23/2012   DIAGNOSTIC LAPAROSCOPY  01/14/2006   "I've had several" (07/23/2017)   ESOPHAGOGASTRODUODENOSCOPY (EGD) WITH PROPOFOL  04/23/2012   JOINT REPLACEMENT     KNEE SURGERY Right 2014   LAPAROSCOPIC OVARIAN CYSTECTOMY Right 04/16/2000   SALPINGOOPHORECTOMY  Left    STAPEDECTOMY Left 05/14/2006   Thyroid Nodule removed  1997   TONSILLECTOMY     TOTAL ABDOMINAL HYSTERECTOMY  01/13/2004   Endometriosis with residual right ovary. Multiple GYN surgeries for chronic pelvic pain; had LSO in past   TUBAL LIGATION     WOUND EXPLORATION Right 10/16/2016   Procedure: RIGHT EXPLORATION OF LACERATION OF INDEX FINGER;  Surgeon: Betha Loa, MD;  Location: [facility];  Service: Orthopedics;  Laterality: Right;   Patient Active Problem List   Diagnosis Date Noted   Concussion with loss of consciousness 08/27/2023   Elevated blood pressure reading 07/17/2021   COVID-19 long hauler manifesting chronic fatigue 05/16/2021   Arthralgia of right temporomandibular joint 08/26/2018   Endometriosis 04/09/2018   Allergic rhinitis due to allergen 12/19/2016   Bilateral deafness 01/17/2016   Family history of breast cancer in mother 08/26/2015   Obesity (BMI 30-39.9) 02/18/2014   Migraine 12/02/2012   Routine adult health maintenance 09/01/2012   Anxiety state 09/03/2006   Hypothyroidism 07/01/2006   GERD 07/01/2006   IBS 07/01/2006    ONSET DATE: 08/25/2023; referred 09/16/23  REFERRING DIAG: D66.0X9A (ICD-10-CM) - Concussion with loss of consciousness, initial encounter   THERAPY DIAG:  Cognitive communication deficit  Rationale for Evaluation and Treatment: Rehabilitation  SUBJECTIVE:  SUBJECTIVE STATEMENT: Pt c/o short term memory "blocks" where she has reduced recall for short amounts of time. Examples given include checking she turned off water numerous times, forgetting if she completed work tasks.  Pt accompanied by: interpreter: Tresa Endo  PERTINENT HISTORY: Head injury with LOC, hx of migraines which she reports have worsened since concussion.   PAIN:  Are you having pain? Yes: NPRS scale: 9 Pain location: head Pain description: migraine Aggravating factors: fluorescent lighting, lack of sleep Relieving factors:  sleep  FALLS: Has patient fallen in last 6 months?  Yes, See PT evaluation for details  LIVING ENVIRONMENT: Lives with: lives with their family and lives with their spouse Lives in: House/apartment  PLOF:  Level of assistance: Independent with ADLs Employment: Full-time employment  PATIENT GOALS: return to baseline   OBJECTIVE:  Note: Objective measures were completed at Evaluation unless otherwise noted.  DIAGNOSTIC FINDINGS: 09/07/23 MRI WO CONTRAST IMPRESSION: 1.  No evidence of an acute intracranial abnormality. 2. Partially empty sella turcica. This finding can reflect incidental anatomic variation, or alternatively, it can be associated with chronic idiopathic intracranial hypertension (pseudotumor cerebri). 3. Otherwise unremarkable non-contrast MRI appearance of the brain.  COGNITION: Overall cognitive status: Within functional limits for tasks assessed Areas of impairment: Attention, memory  Functional deficits: decreased accuracy in home-based tasks, work tasks, c/o cognitive fatigue. Describes implementation of numerous strategies to support.   COGNITIVE COMMUNICATION: Following directions: Follows one step commands consistently  Auditory comprehension: WFL Verbal expression: WFL Functional communication: WFL  ORAL MOTOR EXAMINATION: Overall status: Did not assess  STANDARDIZED ASSESSMENTS: CLQT: Unable to complete due to lack of time.   PATIENT REPORTED OUTCOME MEASURES (PROM): Deferred d/t time                                                                                                                            TREATMENT DATE:  10/08/23: ST educated on memory strategies and patient described several compensatory strategies (visual reminders, calendar, etc.) but currently experiencing frequent fatigue and reports work schedule/tasks are more difficult. ST educated pt on how work and repetitive cognitive tasks are cognitively exhausting post concussion.  Brief discussion regarding cognitve fatigue management via preemptive scheduling, taking brain breaks, pacing throughout the day. ST educated pt and recommended skilled memory strategies/techniques to improve QoL to include using external aids, creating checklists for routine work tasks, using self-talk. Education on attention role in memory.    PATIENT EDUCATION: Education details: see "tx"  Person educated: Patient Education method: Verbal cues Education comprehension: verbalized understanding   GOALS: Goals reviewed with patient? Yes  SHORT TERM GOALS: Target date: 11/05/2023  Pt will create x2 external aids to support successful completion of routine work tasks  Baseline: Goal status: INITIAL  2.  Pt will teach back attention and memory strategies and identify x2 functional implementation opportunities  Baseline:  Goal status: INITIAL  3.  Pt will complete cognitive fatigue management tasks for ID "cost" of routinely  completed tasks and potential areas for improved scheduling/management Baseline:  Goal status: INITIAL   LONG TERM GOALS: Target date: 12/03/2023  Pt will verbalize x4 examples of successful implementation for attention and memory strategies Baseline:  Goal status: INITIAL  2.  Pt will report subjective improvement re: cognitive fatigue via implementation of management strategies  Baseline:  Goal status: INITIAL  ASSESSMENT:  CLINICAL IMPRESSION: Patient is a 53 y.o. women who was seen today for cognitive deficits/memory difficulties following a concussion. CLQT initiated but unable to complete 2/2 time, will plan to complete initial tx session. Clinical interview reveals concerns for mild, high level impairment in attention and memory impacting success and confidence during home based and vocational tasks. Pt has implemented strategies to support, however discussion reveals concerns for laboriousness of those strategies likely 2/2 duplicate systems. Unmanaged  cognitive fatigue and increased frequency and severity of migraines also impacting successful carryover of strategies. Pt would benefit from skilled ST for instruction for strategy/compensation development and implementation to improve QoL and participation in home and vocational activities.   OBJECTIVE IMPAIRMENTS: include memory and executive functioning. These impairments are limiting patient from ADLs/IADLs. Factors affecting potential to achieve goals and functional outcome are  none noted . Patient will benefit from skilled SLP services to address above impairments and improve overall function.  REHAB POTENTIAL: Good  PLAN:  SLP FREQUENCY: 1x/week  SLP DURATION: 8 weeks  PLANNED INTERVENTIONS: Cueing hierachy, Cognitive reorganization, Internal/external aids, Functional tasks, SLP instruction and feedback, Compensatory strategies, and Patient/family education    [place], Student-SLP 10/08/2023, 8:45 AM

## 2023-10-15 ENCOUNTER — Encounter: Payer: Self-pay | Admitting: Dietician

## 2023-10-22 ENCOUNTER — Telehealth: Payer: Self-pay

## 2023-10-22 ENCOUNTER — Ambulatory Visit: Payer: 59 | Admitting: Internal Medicine

## 2023-10-22 ENCOUNTER — Encounter: Payer: Self-pay | Admitting: Internal Medicine

## 2023-10-22 VITALS — BP 133/81 | HR 70 | Temp 98.3°F | Ht 60.0 in | Wt 193.3 lb

## 2023-10-22 DIAGNOSIS — S060X9D Concussion with loss of consciousness of unspecified duration, subsequent encounter: Secondary | ICD-10-CM | POA: Diagnosis not present

## 2023-10-22 DIAGNOSIS — G43909 Migraine, unspecified, not intractable, without status migrainosus: Secondary | ICD-10-CM | POA: Diagnosis not present

## 2023-10-22 DIAGNOSIS — G43719 Chronic migraine without aura, intractable, without status migrainosus: Secondary | ICD-10-CM

## 2023-10-22 DIAGNOSIS — Z23 Encounter for immunization: Secondary | ICD-10-CM | POA: Insufficient documentation

## 2023-10-22 DIAGNOSIS — R030 Elevated blood-pressure reading, without diagnosis of hypertension: Secondary | ICD-10-CM | POA: Diagnosis not present

## 2023-10-22 DIAGNOSIS — Z1211 Encounter for screening for malignant neoplasm of colon: Secondary | ICD-10-CM

## 2023-10-22 HISTORY — DX: Encounter for screening for malignant neoplasm of colon: Z12.11

## 2023-10-22 MED ORDER — TRAMADOL HCL 50 MG PO TABS: ORAL_TABLET | ORAL | 0 refills | Status: DC

## 2023-10-22 MED ORDER — RIMEGEPANT SULFATE 75 MG PO TBDP: 75.0000 mg | ORAL_TABLET | ORAL | 1 refills | Status: DC

## 2023-10-22 NOTE — Assessment & Plan Note (Signed)
Due for pneumonia vaccination. Patient requests to wait until next visit.

## 2023-10-22 NOTE — Assessment & Plan Note (Signed)
 Continued migraines and some cognitive changes since concussion in early December. Migraine treatment addressed elsewhere.   She was able to see an eye doctor, vision changes have improved with new glasses. MRI with evidence of partial empty sella, although no evidence of papilledema on retinal exam from optometry visit.   She does feel emotionally tired from chronic pain and fatigue while continuing to work. Applied for FMLA but unable to take off due to not having sick days. No short term disability available.   Overall, small improvements since concussion. We will continue to work on migraine prevention/treatment and encouraged continued therapy.

## 2023-10-22 NOTE — Assessment & Plan Note (Signed)
 Longstanding history of difficult to control migraines. Typically occurred intermittently, but unfortunately have been daily since she sustained a concussion in early December. She has been seen at concussion clinic and is completing cognitive therapy. She has had a reduction in the intensity of the headaches but frequency remains daily. Trialed on topiramate  with adverse effects, started on sumatriptan  without any relief.   Previous preventive trials also included beta blockers and amitriptyline  with poor tolerance. Started on tramadol  in 2016 for abortive therapy with some improvement so this was continued. She still has some improvement in intensity of pain with tramadol  but having to take it every day since her injury.  She also notes a separate pain over her right scalp, ear, and into the right face since removal of her cochlear implant last fall. This pain is sharp and worse with even light touch of the area, different than migraine pain. She has f/u with her surgeon at the end of February.  Plan -Start Nurtec every other day for migraine prevention -Discussed decreasing frequency of tramadol , as daily or frequent use may contribute to chronic headache -If no improvement with the above, we did discuss referral to neurology for other preventive options, including injectable CGRP antagonists -F/u with me after seeing her ENT surgeon--could consider medication such as carbamazepine for neuralgia pain, as I do think she has two separate pain drivers

## 2023-10-22 NOTE — Assessment & Plan Note (Signed)
BP improved from last visit. Continue to monitor at future visits.

## 2023-10-22 NOTE — Assessment & Plan Note (Signed)
Due for CSY. Appt with GI scheduled tomorrow.

## 2023-10-22 NOTE — Progress Notes (Signed)
 Established Patient Office Visit  Subjective   Patient ID: Donna Davis, female    DOB: 03/24/1971  Age: 53 y.o. MRN: [id number]  Chief Complaint  Patient presents with   Follow-up    Migraines.    Ms. Applin returns to clinic for follow-up of chronic migraines, recently worsened by concussion after falling from her inversion table in early December. Please see assessment/plan in problem-based charting for further details of today's visit.    Patient Active Problem List   Diagnosis Date Noted   Screening for colon cancer 10/22/2023   Need for Tdap vaccination 10/22/2023   Need for pneumococcal vaccine 10/22/2023   Concussion with loss of consciousness 08/27/2023   Elevated blood pressure reading 07/17/2021   COVID-19 long hauler manifesting chronic fatigue 05/16/2021   Arthralgia of right temporomandibular joint 08/26/2018   Endometriosis 04/09/2018   Allergic rhinitis due to allergen 12/19/2016   Bilateral deafness 01/17/2016   Family history of breast cancer in mother 08/26/2015   Obesity (BMI 30-39.9) 02/18/2014   Migraine 12/02/2012   Routine adult health maintenance 09/01/2012   Anxiety state 09/03/2006   Hypothyroidism 07/01/2006   GERD 07/01/2006   IBS 07/01/2006      Objective:     BP 133/81 (BP Location: Right Arm, Patient Position: Sitting, Cuff Size: Normal)   Pulse 70   Temp 98.3 F (36.8 C) (Oral)   Ht 5' (1.524 m)   Wt 193 lb 4.8 oz (87.7 kg)   LMP 12/26/2004   SpO2 99% Comment: RA  BMI 37.75 kg/m  BP Readings from Last 3 Encounters:  10/22/23 133/81  09/30/23 128/86  09/17/23 134/81   Wt Readings from Last 3 Encounters:  10/22/23 193 lb 4.8 oz (87.7 kg)  09/30/23 190 lb (86.2 kg)  09/04/23 196 lb (88.9 kg)    Physical Exam Constitutional:      General: She is not in acute distress.    Appearance: Normal appearance. She is not ill-appearing.  HENT:     Nose: Nose normal.  Eyes:     Extraocular Movements: Extraocular movements intact.      Conjunctiva/sclera: Conjunctivae normal.  Neurological:     General: No focal deficit present.     Mental Status: She is alert.  Psychiatric:        Mood and Affect: Mood normal.        Behavior: Behavior normal.        Thought Content: Thought content normal.        Judgment: Judgment normal.      Assessment & Plan:   Problem List Items Addressed This Visit       Cardiovascular and Mediastinum   Migraine - Primary   Longstanding history of difficult to control migraines. Typically occurred intermittently, but unfortunately have been daily since she sustained a concussion in early December. She has been seen at concussion clinic and is completing cognitive therapy. She has had a reduction in the intensity of the headaches but frequency remains daily. Trialed on topiramate  with adverse effects, started on sumatriptan  without any relief.   Previous preventive trials also included beta blockers and amitriptyline  with poor tolerance. Started on tramadol  in 2016 for abortive therapy with some improvement so this was continued. She still has some improvement in intensity of pain with tramadol  but having to take it every day since her injury.  She also notes a separate pain over her right scalp, ear, and into the right face since removal of her cochlear implant  last fall. This pain is sharp and worse with even light touch of the area, different than migraine pain. She has f/u with her surgeon at the end of February.  Plan -Start Nurtec every other day for migraine prevention -Discussed decreasing frequency of tramadol , as daily or frequent use may contribute to chronic headache -If no improvement with the above, we did discuss referral to neurology for other preventive options, including injectable CGRP antagonists -F/u with me after seeing her ENT surgeon--could consider medication such as carbamazepine for neuralgia pain, as I do think she has two separate pain drivers      Relevant  Medications   Rimegepant Sulfate  75 MG TBDP   traMADol  (ULTRAM ) 50 MG tablet     Nervous and Auditory   Concussion with loss of consciousness   Continued migraines and some cognitive changes since concussion in early December. Migraine treatment addressed elsewhere.   She was able to see an eye doctor, vision changes have improved with new glasses. MRI with evidence of partial empty sella, although no evidence of papilledema on retinal exam from optometry visit.   She does feel emotionally tired from chronic pain and fatigue while continuing to work. Applied for FMLA but unable to take off due to not having sick days. No short term disability available.   Overall, small improvements since concussion. We will continue to work on migraine prevention/treatment and encouraged continued therapy.         Other   Elevated blood pressure reading   BP improved from last visit. Continue to monitor at future visits.      Screening for colon cancer   Due for CSY. Appt with GI scheduled tomorrow.      Need for Tdap vaccination   Due for Tdap. Patient requests to wait until next visit.      Need for pneumococcal vaccine   Due for pneumonia vaccination. Patient requests to wait until next visit.       Return in about 3 months (around 01/19/2024).    Ronnald Sergeant, MD

## 2023-10-22 NOTE — Telephone Encounter (Signed)
 Decision:Approved  [place] (Key: [id number]) PA Case ID #: [id number] Rx #: [id number] Need Help? Call us  at [phone] Outcome Approved today by [facility] NCPDP 2017 Your PA request has been approved. Additional information will be provided in the approval communication. (Message 1145) Authorization Expiration Date: 10/20/2024 Drug Nurtec 75MG  dispersible tablets  Form [employer] Electronic PA Form [phone] NCPDP) Original Claim Info [phone]

## 2023-10-22 NOTE — Telephone Encounter (Addendum)
Prior Authorization for patient (Nurtec 75MG  dispersible tablets ) came through on cover my meds was submitted with last office notes awaiting approval or denial.  [id number]

## 2023-10-22 NOTE — Assessment & Plan Note (Signed)
Due for Tdap. Patient requests to wait until next visit.

## 2023-10-22 NOTE — Patient Instructions (Signed)
 It was wonderful to see you today!  For migraines, you will start Nurtec every other day for prevention. Try not to use tramadol  every day and only one tablet when needed.  Please let me know how things are going, as well as what you hear from your surgeon at the end of February.  Please contact your pharmacy about scheduling the shingles (Shingrix) vaccine.

## 2023-10-23 ENCOUNTER — Ambulatory Visit: Payer: 59 | Admitting: Gastroenterology

## 2023-10-23 ENCOUNTER — Encounter: Payer: Self-pay | Admitting: Gastroenterology

## 2023-10-23 VITALS — BP 130/90 | HR 58 | Ht 60.0 in | Wt 190.0 lb

## 2023-10-23 DIAGNOSIS — K580 Irritable bowel syndrome with diarrhea: Secondary | ICD-10-CM

## 2023-10-23 DIAGNOSIS — K588 Other irritable bowel syndrome: Secondary | ICD-10-CM

## 2023-10-23 DIAGNOSIS — A084 Viral intestinal infection, unspecified: Secondary | ICD-10-CM

## 2023-10-23 DIAGNOSIS — Z1211 Encounter for screening for malignant neoplasm of colon: Secondary | ICD-10-CM

## 2023-10-23 MED ORDER — SUFLAVE 178.7 G PO SOLR: 1.0000 | Freq: Once | ORAL | 0 refills | Status: AC

## 2023-10-23 MED ORDER — DICYCLOMINE HCL 10 MG PO CAPS: 10.0000 mg | ORAL_CAPSULE | Freq: Every day | ORAL | 1 refills | Status: DC

## 2023-10-23 MED ORDER — SUFLAVE 178.7 G PO SOLR: 1.0000 | Freq: Once | ORAL | 0 refills | Status: DC

## 2023-10-23 NOTE — Progress Notes (Signed)
 Discussed the use of AI scribe software for clinical note transcription with the patient, who gave verbal consent to proceed.  HPI : Donna Davis is a 53 y.o. female with a history of anxiety, migraines, IBS and congenital deafness who is referred to us  by [name], MD for further evaluation of worsening IBS symptoms..   She reports a long-standing history of irritable bowel syndrome (IBS) since the age of 24, characterized by alternating constipation and diarrhea. Over the past six months, her symptoms have changed, with diarrhea becoming more unpredictable and not controlled by dietary modifications as it was previously. The diarrhea occurs for several days at a time, with the current episode lasting three days, typically with several bowel movements per day. During these episodes, she experiences fecal urgency and has had episodes of incontinence, including at work, necessitating a change of clothes. The diarrhea also wakes her from sleep, which is a new symptom. She has a history of numerous endoscopic evaluations and dozens of radiographic studies to evaluate her chronic GI symptoms, all of which were normal or did not reveal a cause of her symptoms.  Her last colonoscopy was in 2013 and was normal.  For constipation, she goes up to seven days without a bowel movement, resulting in increased pain. She has tried dietary changes such as reducing bread and increasing fiber, and has used stool softeners in the past to manage constipation. She avoids red meat as part of her dietary management.  She previously used Bentyl , but it led to worsened constipation, forcing her to discontinue its use. She was taking Bentyl  twice daily.  She has not used bile acid binders like cholestyramine.  She has a history of migraines for which she was prescribed amitriptyline , but she did not tolerate it well and did not use it for IBS. Amitriptyline  caused sedation to the point where she could not walk.  For  her colonoscopy, she states that she had severe hypotension when she used OsmoPrep on her previous colonoscopy  A partial list of most recent abdominal imaging exams to evaluate GI symptoms  CT abdomen with contrast June 14, 2020 Indication: Nausea/vomiting IMPRESSION: 1. No acute process in the abdomen. No explanation for patient's symptoms. 2. Aortic Atherosclerosis (ICD10-I70.0).  CT abdomen/pelvis with contrast May 20, 2019 Indication: Acute generalized abdominal pain IMPRESSION: 1. No evidence of acute abnormalities within the solid abdominal organs. 2. Asymmetric enlarged less than 1 cm in short axis mesenteric lymph nodes in the right lower quadrant of the abdomen. These are nonspecific and may represent reactive lymph nodes, or mesenteric adenitis. Early malignant lymphadenopathy cannot be entirely excluded.  CT abdomen/pelvis with contrast July 23, 2017 Indication: Abdominal pain with nausea, vomiting, diarrhea IMPRESSION: 1. Normal appendix. 2. Right lower quadrant mesenteric lymph nodes raising the question of mesenteric adenitis. 3. No bowel obstruction or abscess. 4. Status post cholecystectomy and hysterectomy  CT abdomen/pelvis with contrast March 04, 2016 Indication: Acute generalized abdominal pain IMPRESSION: 1. No CT evidence for acute intra-abdominal or pelvic process. 2. Status post cholecystectomy and hysterectomy.  CT abdomen/pelvis with contrast June 12, 2015 Indication: Right-sided abdominal pain with nausea and vomiting IMPRESSION: Negative for appendicitis.  Negative exam  CT abdomen/pelvis with contrast September 29, 2013 Indication: Right lower quadrant pain IMPRESSION:  1. No acute abnormalities involving the abdomen or pelvis. Moderate  stool burden.  2. Diverticulum arising from the medial descending duodenum, at or  near the insertion of the common bile duct. No biliary ductal  dilation   CT abdomen/pelvis with  contrast July 01, 2013 Indication: Right-sided abdominal pain IMPRESSION:  1. Small bilateral pleural effusions with right base atelectasis.  2. No evidence for intra-abdominal or pelvic fluid collection to  suggest abscess or biloma.  3. Postoperative change from cholecystectomy.   Right upper quadrant ultrasound June 13, 2013 Indication: Right upper quadrant abdominal pain IMPRESSION:  1. Cholelithiasis without acute cholecystitis.  2. Common bile duct upper normal to minimally dilated for age.  Correlate with bilirubin levels. If these are elevated, consider  further characterization with contrast-enhanced CT or MRCP  CT abdomen/pelvis without contrast March 14, 2013 Indication: Left-sided pain IMPRESSION:  No acute finding.  No evidence of urinary tract stone disease.  No  cause of pain identified   CT abdomen/pelvis with contrast October 23, 2012 Indication: Right lower quadrant pain IMPRESSION:  Negative for appendicitis. No acute finding.   Abdominal ultrasound February 29, 2012 Indication: Abdominal pain, nausea IMPRESSION:  Negative abdominal ultrasound   Abdominal ultrasound December 01, 2010 Indication: Abdominal pain IMPRESSION:  Negative abdominal ultrasound.   There are at least 15 additional abdominal imaging studies prior to these.   Endoscopic history  Colonoscopy 2013 Dr. Jakie Indication: Diarrhea, screening Normal colonoscopy Random biopsies, normal  EGD 2013 Dr. Jakie Indication: diarrhea Normal endoscopic exam Gastric and duodenal biopsies taken to rule out H. pylori and celiac disease  Colonoscopy November 16, 2006 Dr. Aneita Indication: Diarrhea Normal colonoscopy  EGD November 15, 2006 Dr. Obie Indication: Abdominal pain Normal upper endoscopy  Colonoscopy Feb 04, 2006 Indication: Constipation, diarrhea, abdominal pain Normal colonoscopy normal terminal ileum  EGD September 19, 1998 Indication: Abdominal pain Normal  endoscopy  Colonoscopy June 07, 1995 Indication: Abdominal pain Normal colonoscopy  Flexible sigmoidoscopy August 31, 1991 Indication: Alternating constipation and diarrhea Normal flexible sigmoidoscopy to 30 cm Past Medical History:  Diagnosis Date   Amitriptyline  adverse reaction 04/08/2013   Anxiety    Clostridium difficile infection    x several times   Deaf    uses sign language   Endometriosis    s/p TAH, LSO   Family history of malignant neoplasm of gastrointestinal tract    Gallstones    GERD (gastroesophageal reflux disease)    Hearing difficulty    b/l, 2/2 congenital rubella s/p stapedectomy. Using hearing aid   Hepatic cyst    6 mm on CT done done in 05/2005   Hypothyroidism    IBS (irritable bowel syndrome)    Migraines    frequency depends on my stress level (07/23/2017)   Obesity    PONV (postoperative nausea and vomiting)    sometimes; depends on how long I'm under   Shingles 03/2013     Past Surgical History:  Procedure Laterality Date   ABDOMINAL ADHESION SURGERY     Enterolysis   CESAREAN SECTION  1994   CHOLECYSTECTOMY N/A 06/26/2013   Procedure: LAPAROSCOPIC CHOLECYSTECTOMY WITH ATTEMPTED INTRAOPERATIVE CHOLANGIOGRAM;  Surgeon: Vicenta DELENA Poli, MD;  Location: [facility];  Service: General;  Laterality: N/A;   COCHLEAR IMPLANT  03/30/2016   COCHLEAR IMPLANT Right 03/2015   COLONOSCOPY WITH PROPOFOL   04/23/2012   DIAGNOSTIC LAPAROSCOPY  01/14/2006   I've had several (07/23/2017)   ESOPHAGOGASTRODUODENOSCOPY (EGD) WITH PROPOFOL   04/23/2012   JOINT REPLACEMENT     KNEE SURGERY Right 2014   LAPAROSCOPIC OVARIAN CYSTECTOMY Right 04/16/2000   SALPINGOOPHORECTOMY Left    STAPEDECTOMY Left 05/14/2006   Thyroid  Nodule removed  1997   TONSILLECTOMY  TOTAL ABDOMINAL HYSTERECTOMY  01/13/2004   Endometriosis with residual right ovary. Multiple GYN surgeries for chronic pelvic pain; had LSO in past   TUBAL LIGATION     WOUND EXPLORATION  Right 10/16/2016   Procedure: RIGHT EXPLORATION OF LACERATION OF INDEX FINGER;  Surgeon: Franky Curia, MD;  Location: [facility];  Service: Orthopedics;  Laterality: Right;   Family History  Problem Relation Age of Onset   Breast cancer Mother    Ovarian cancer Mother    Prostate cancer Father    Diabetes Father    Heart disease Father        heart attack   Irritable bowel syndrome Father    Kidney disease Father    Colon polyps Father    Stomach cancer Maternal Grandmother    Colon cancer Maternal Grandmother    Diabetes Maternal Grandmother    Bipolar disorder Daughter    Heart disease Other        both grandmothers   Esophageal cancer Neg Hx    Liver disease Neg Hx    Social History   Tobacco Use   Smoking status: Former    Current packs/day: 0.00    Average packs/day: 1 pack/day for 26.0 years (26.0 ttl pk-yrs)    Types: Cigarettes    Start date: 02/17/1981    Quit date: 02/18/2007    Years since quitting: 16.6   Smokeless tobacco: Never   Tobacco comments:    quit 2008  Vaping Use   Vaping status: Never Used  Substance Use Topics   Alcohol use: Yes    Comment: Occasionally.   Drug use: No   Current Outpatient Medications  Medication Sig Dispense Refill   ALPRAZolam  (XANAX ) 0.25 MG tablet Take 1 tablet (0.25 mg total) by mouth at bedtime as needed for anxiety. 30 tablet 3   Ascorbic Acid (VITAMIN C PO) Take 1 tablet by mouth daily.     busPIRone  (BUSPAR ) 10 MG tablet TAKE 2 TABLETS BY MOUTH 3 TIMES A DAY 540 tablet 2   estradiol  (ESTRACE ) 1 MG tablet TAKE 1 TABLET BY MOUTH EVERY DAY 90 tablet 3   fluticasone  (FLONASE ) 50 MCG/ACT nasal spray INSTILL 1 SPRAY INTO BOTH NOSTRILS DAILY 16 mL 2   levothyroxine  (SYNTHROID ) 25 MCG tablet TAKE 1 TABLET BY MOUTH EVERY DAY 90 tablet 3   pantoprazole  (PROTONIX ) 40 MG tablet Take 1 tablet (40 mg total) by mouth daily. 90 tablet 3   Rimegepant Sulfate  75 MG TBDP Take 1 tablet (75 mg total) by mouth every other day.  15 tablet 1   traMADol  (ULTRAM ) 50 MG tablet Take 1 tablet (50 mg) by mouth every 6 (six) hours as needed for severe pain. 40 tablet 0   dicyclomine  (BENTYL ) 10 MG capsule Take 1 capsule (10 mg total) by mouth 4 (four) times daily -  before meals and at bedtime. (Patient not taking: Reported on 10/23/2023) 40 capsule 0   promethazine  (PHENERGAN ) 12.5 MG tablet Take 1 tablet (12.5 mg total) by mouth every 6 (six) hours as needed for nausea or vomiting. (Patient not taking: Reported on 10/23/2023) 30 tablet 0   No current facility-administered medications for this visit.   Allergies  Allergen Reactions   Onion Anaphylaxis   Other Other (See Comments)    Ossmo Prep - drop in BP Steroids Went crazy   Shellfish Allergy Anaphylaxis and Swelling   Amitriptyline  Anxiety, Palpitations and Other (See Comments)    hallucinations   Amoxicillin -Pot Clavulanate Nausea And  Vomiting    With high dose, low dose ok   Amoxicillin -Pot Clavulanate Nausea And Vomiting    With high dose, low dose ok   Cefuroxime Nausea And Vomiting   Cephalexin  Other (See Comments)     gi upset  gi upset   Ciprofloxacin  Other (See Comments)     Fevers   Clarithromycin Nausea And Vomiting and Other (See Comments)    Pt is OK with azithromycin   gi upset   Clarithromycin Nausea And Vomiting    Pt is OK with azithromycin    Doxycycline  Other (See Comments) and Hives     gi upset  gi upset   Propranolol  Other (See Comments)    Fatigue and makes her feel crazy, refuses to take.   Sulfonamide Derivatives Other (See Comments)    vaginal blisters   Sulfa Antibiotics Other (See Comments)    Rash, vaginal blisters   Sulfasalazine Itching    Rash, vaginal blisters Rash, vaginal blisters    Benadryl  [Diphenhydramine  Hcl (Sleep)] Anxiety    Extreme agitation   Shrimp Flavor Agent (Non-Screening) Other (See Comments)    unknown     Review of Systems: All systems reviewed and negative except where noted in HPI.    No  results found.  Physical Exam: BP (!) 130/90   Pulse (!) 58   Ht 5' (1.524 m)   Wt 190 lb (86.2 kg)   LMP 12/26/2004   BMI 37.11 kg/m  Constitutional: Pleasant,well-developed, Caucasian female in no acute distress.  Accompanied by sign language interpreter in clinic HEENT: Normocephalic and atraumatic. Conjunctivae are normal. No scleral icterus. Neurological: Alert and oriented to person place and time.  Hearing impaired, uses sign language through interpreter Skin: Skin is warm and dry. No rashes noted. Psychiatric: Normal mood and affect. Behavior is normal.  CBC    Component Value Date/Time   WBC 8.4 07/15/2023 1542   RBC 4.39 07/15/2023 1542   HGB 13.5 07/15/2023 1542   HGB 13.8 06/30/2021 1140   HCT 40.7 07/15/2023 1542   HCT 40.1 06/30/2021 1140   PLT 262 07/15/2023 1542   PLT 296 06/30/2021 1140   MCV 92.7 07/15/2023 1542   MCV 91 06/30/2021 1140   MCH 30.8 07/15/2023 1542   MCHC 33.2 07/15/2023 1542   RDW 11.6 07/15/2023 1542   RDW 12.3 06/30/2021 1140   LYMPHSABS 3.3 07/15/2023 1542   LYMPHSABS 2.5 06/30/2021 1140   MONOABS 0.6 07/15/2023 1542   EOSABS 0.2 07/15/2023 1542   EOSABS 0.2 06/30/2021 1140   BASOSABS 0.1 07/15/2023 1542   BASOSABS 0.0 06/30/2021 1140    CMP     Component Value Date/Time   NA 134 (L) 07/15/2023 1542   NA 143 05/31/2022 0954   K 3.8 07/15/2023 1542   CL 104 07/15/2023 1542   CO2 23 07/15/2023 1542   GLUCOSE 89 07/15/2023 1542   BUN 6 07/15/2023 1542   BUN 11 05/31/2022 0954   CREATININE 0.68 07/15/2023 1542   CREATININE 0.68 02/17/2014 0916   CALCIUM  8.8 (L) 07/15/2023 1542   PROT 7.2 05/31/2022 0954   ALBUMIN 4.5 05/31/2022 0954   AST 14 05/31/2022 0954   ALT 11 05/31/2022 0954   ALKPHOS 66 05/31/2022 0954   BILITOT <0.2 05/31/2022 0954   GFRNONAA >60 07/15/2023 1542   GFRNONAA >89 02/17/2014 0916   GFRAA >60 06/14/2020 1152   GFRAA >89 02/17/2014 0916       Latest Ref Rng & Units 07/15/2023    3:42 PM  10/22/2021     9:20 AM 06/30/2021   11:40 AM  CBC EXTENDED  WBC 4.0 - 10.5 K/uL 8.4  9.0  6.9   RBC 3.87 - 5.11 MIL/uL 4.39  4.62  4.42   Hemoglobin 12.0 - 15.0 g/dL 86.4  85.9  86.1   HCT 36.0 - 46.0 % 40.7  43.4  40.1   Platelets 150 - 400 K/uL 262  275  296   NEUT# 1.7 - 7.7 K/uL 4.2   3.7   Lymph# 0.7 - 4.0 K/uL 3.3   2.5       ASSESSMENT AND PLAN:  53 year old female with history of anxiety, migraines IBS with extensive ER utilization for abdominal pain resulting in over a dozen CT scans and multiple ultrasounds, presenting for further management of worsening abdominal pain and diarrhea with constipation, which patient states are different than her previous symptoms.  She is due for routine colon cancer screening.  Irritable Bowel Syndrome (IBS), diarrhea predominant Chronic IBS with alternating constipation and diarrhea, worsening over the past six months with increased frequency and severity of diarrhea, including nocturnal episodes and urgency.  Although constipation has been a problem in the past, diarrhea has been predominant symptom of late, now with urgency, incontinence and nocturnal stools.  Previous management with dietary modifications and Bentyl  was partially effective but caused significant constipation. Adverse reactions to amitriptyline  noted. Discussed reintroducing Bentyl  at a lower dose and adding a bile acid binder if symptoms persist. Explained the low FODMAP diet as a dietary approach with the most evidence for managing IBS symptoms. - Prescribe Bentyl  10 mg as needed - Provide handout on low FODMAP diet - Consider bile acid binder if symptoms persist -Consider rifaximin if Bentyl  and bile acid binder/low FODMAP diet not effective.  Patient not candidate for Viberzi given cholecystectomy status -Plan to repeat random colon biopsies to exclude microscopic colitis, given reported change in diarrhea symptoms  Colon cancer screening Patient is due for routine screening.  No  family history of colon cancer.  No red flag symptoms.  Previous adverse reaction to Osmoprep with significant hypotension.  - Schedule average risk screening colonoscopy - Avoid Osmoprep  The details, risks (including bleeding, perforation, infection, missed lesions, medication reactions and possible hospitalization or surgery if complications occur), benefits, and alternatives to colonoscopy with possible biopsy and possible polypectomy were discussed with the patient and she consents to proceed.    Valley Ke E. Stacia, MD [facility]  I spent a total of 45 minutes reviewing the patient's medical record, interviewing and examining the patient, discussing her diagnosis and management of her condition going forward, and documenting in the medical record    [name], MD

## 2023-10-23 NOTE — Patient Instructions (Signed)
 You have been scheduled for a colonoscopy. Please follow written instructions given to you at your visit today.   If you use inhalers (even only as needed), please bring them with you on the day of your procedure.  DO NOT TAKE 7 DAYS PRIOR TO TEST- Trulicity (dulaglutide) Ozempic, Wegovy (semaglutide) Mounjaro (tirzepatide) Bydureon Bcise (exanatide extended release)  DO NOT TAKE 1 DAY PRIOR TO YOUR TEST Rybelsus (semaglutide) Adlyxin (lixisenatide) Victoza (liraglutide) Byetta (exanatide) ___________________________________________________________________________  Rosine will receive your bowel preparation through [facility], which ensures the lowest copay and home delivery, with outreach via text or call from an [phone] number. Please respond promptly to avoid rescheduling of your procedure. If you are interested in alternative options or have any questions regarding your prep, please contact them at [phone] ____________________________________________________________________________  Your Provider Has Sent Your Bowel Prep Regimen To [facility]   [facility] will contact you to verify your information and collect your copay, if applicable. Enjoy the comfort of your home while your prescription is mailed to you, FREE of any shipping charges.   [facility] accepts all major insurance benefits and applies discounts & coupons.  Have additional questions?   Chat: [email or web address] Call: [phone] Email: [email or web address] NCPDP: [id number]  How will [facility] contact you?  With a Welcome phone call,  a Welcome text and a checkout link in text form.  Texts you receive from [phone] Are NOT Spam.  *To set up delivery, you must complete the checkout process via link or speak to one of the patient care representatives. If [facility] is unable to reach you, your prescription may be delayed.  To avoid long hold times on the phone, you may also utilize the secure chat  feature on the [facility] website to request that they call you back for transaction completion or to expedite your concerns.   _______________________________________________________  If your blood pressure at your visit was 140/90 or greater, please contact your primary care physician to follow up on this.  _______________________________________________________  If you are age 60 or older, your body mass index should be between 23-30. Your Body mass index is 37.11 kg/m. If this is out of the aforementioned range listed, please consider follow up with your Primary Care Provider.  If you are age 62 or younger, your body mass index should be between 19-25. Your Body mass index is 37.11 kg/m. If this is out of the aformentioned range listed, please consider follow up with your Primary Care Provider.   ________________________________________________________  The [facility] providers would like to encourage you to use MYCHART to communicate with providers for non-urgent requests or questions.  Due to long hold times on the telephone, sending your provider a message by [facility] may be a faster and more efficient way to get a response.  Please allow 48 business hours for a response.  Please remember that this is for non-urgent requests.  _______________________________________________________ It was a pleasure to see you today!  Thank you for trusting me with your gastrointestinal care!

## 2023-10-25 ENCOUNTER — Encounter: Payer: 59 | Admitting: Family Medicine

## 2023-10-25 NOTE — Progress Notes (Deleted)
   Donna Ileana Collet, PhD, LAT, ATC acting as a scribe for Artist Lloyd, MD.  Donna Davis is a 53 y.o. female who presents to [facility] at [facility] today for f/u concussion w/ LOC. On Dec 8th, she was on an inversion table and the leg stabilizer piece broke off, causing her to slide off. Pt was last seen by Dr. Lloyd 09/30/23 and was prescribed Imitrex . She has cont'd vestibular therapy, 4 visits (d/c 1/20). And had her 1st speech therapy visit on the 21st.   Today, pt reports ***  Pertinent review of systems: ***  Relevant historical information: ***   Exam:  LMP 12/26/2004  General: Well Developed, well nourished, and in no acute distress.   MSK: ***    Lab and Radiology Results No results found. However, due to the size of the patient record, not all encounters were searched. Please check Results Review for a complete set of results. No results found.     Assessment and Plan: 53 y.o. female with ***   PDMP not reviewed this encounter. No orders of the defined types were placed in this encounter.  No orders of the defined types were placed in this encounter.    Discussed warning signs or symptoms. Please see discharge instructions. Patient expresses understanding.   ***

## 2023-10-30 ENCOUNTER — Encounter: Payer: Self-pay | Admitting: Internal Medicine

## 2023-10-31 ENCOUNTER — Telehealth: Payer: Self-pay | Admitting: Gastroenterology

## 2023-10-31 MED ORDER — ALPRAZOLAM 0.25 MG PO TABS: 0.2500 mg | ORAL_TABLET | Freq: Every evening | ORAL | 0 refills | Status: DC | PRN

## 2023-10-31 NOTE — Telephone Encounter (Signed)
Inbound call from patient with interpreter stating she is scheduled for a colonoscopy on 3/27 at 9:30. She states that the prep is going to cost her 150 dollars and she does not have that type of money. Patient states she needs different prep or else she will cancel the appointment and that we need to contact her insurance to discuss what they will cover. Please advise.

## 2023-11-07 ENCOUNTER — Ambulatory Visit: Payer: 59 | Admitting: Family Medicine

## 2023-11-07 VITALS — BP 142/94 | HR 83 | Ht 60.0 in | Wt 190.0 lb

## 2023-11-07 DIAGNOSIS — F0781 Postconcussional syndrome: Secondary | ICD-10-CM

## 2023-11-07 DIAGNOSIS — F411 Generalized anxiety disorder: Secondary | ICD-10-CM | POA: Diagnosis not present

## 2023-11-07 DIAGNOSIS — G43719 Chronic migraine without aura, intractable, without status migrainosus: Secondary | ICD-10-CM | POA: Diagnosis not present

## 2023-11-07 MED ORDER — EMGALITY 120 MG/ML ~~LOC~~ SOAJ: 240.0000 mg | Freq: Once | SUBCUTANEOUS | 0 refills | Status: AC

## 2023-11-07 MED ORDER — CLONAZEPAM 0.5 MG PO TABS: 0.5000 mg | ORAL_TABLET | Freq: Two times a day (BID) | ORAL | 0 refills | Status: DC | PRN

## 2023-11-07 NOTE — Progress Notes (Unsigned)
   Rubin Payor, PhD, LAT, ATC acting as a scribe for Clementeen Graham, MD.  Estelene P Ellinwood is a 53 y.o. female who presents to [facility] at [facility] today for f/u concussion w/ LOC. On Dec 8th, she was on an inversion table and the leg stabilizer piece broke off, causing her to slide off. Pt was last seen by Dr. Denyse Amass 09/30/23 and was prescribed Imitrex. She has cont'd vestibular therapy, 4 visits (d/c 1/20). And had her 1st speech therapy visit on the 21st.   Today, pt reports she is feeling the same. She c/o cont'd HA, photophobia. She expresses extreme frustration, which is impacting her mental health. Very irritable and quick to anger. Worsening nausea. She doesn't feel like any of the rx are helping. She also expresses financial stress of having so many doctor's appointments and therapy visits.   Pertinent review of systems: ***  Relevant historical information: ***   Exam:  LMP 12/26/2004  General: Well Developed, well nourished, and in no acute distress.   MSK: ***    Lab and Radiology Results No results found. However, due to the size of the patient record, not all encounters were searched. Please check Results Review for a complete set of results. No results found.     Assessment and Plan: 53 y.o. female with ***   PDMP not reviewed this encounter. No orders of the defined types were placed in this encounter.  No orders of the defined types were placed in this encounter.    Discussed warning signs or symptoms. Please see discharge instructions. Patient expresses understanding.   ***

## 2023-11-07 NOTE — Patient Instructions (Addendum)
Thank you for coming in today.   We will try emgality for headache prevention.   Let me know when you get it.   If you are not 100% sure how to give it to yourself you can bring it here and Donna Davis will show you.   Plan for Clonazepam twice dialy for anxiety instead of Xanax.   Let me know.

## 2023-11-08 ENCOUNTER — Telehealth: Payer: Self-pay

## 2023-11-08 ENCOUNTER — Other Ambulatory Visit: Payer: Self-pay | Admitting: Internal Medicine

## 2023-11-08 DIAGNOSIS — G43719 Chronic migraine without aura, intractable, without status migrainosus: Secondary | ICD-10-CM

## 2023-11-08 NOTE — Telephone Encounter (Signed)
Emgality 120 mg/mL  CoverMyMeds.com KEY: [id number]

## 2023-11-08 NOTE — Telephone Encounter (Signed)
Prior Authorization initiated for [facility] via [email or web address] KEY: [id number]

## 2023-11-11 NOTE — Telephone Encounter (Signed)
 Prior Auth for [facility] APPROVED PA# not provided Valid: through 02/05/24

## 2023-11-17 ENCOUNTER — Other Ambulatory Visit: Payer: Self-pay | Admitting: Gastroenterology

## 2023-11-17 DIAGNOSIS — K580 Irritable bowel syndrome with diarrhea: Secondary | ICD-10-CM

## 2023-12-02 ENCOUNTER — Other Ambulatory Visit: Payer: Self-pay | Admitting: Internal Medicine

## 2023-12-02 DIAGNOSIS — G43719 Chronic migraine without aura, intractable, without status migrainosus: Secondary | ICD-10-CM

## 2023-12-02 MED ORDER — TRAMADOL HCL 50 MG PO TABS
ORAL_TABLET | ORAL | 0 refills | Status: DC
Start: 2023-12-02 — End: 2024-01-14

## 2023-12-12 ENCOUNTER — Encounter: Payer: 59 | Admitting: Gastroenterology

## 2023-12-16 ENCOUNTER — Other Ambulatory Visit: Payer: Self-pay | Admitting: Family Medicine

## 2023-12-17 NOTE — Telephone Encounter (Signed)
 Last OV 11/07/23 Next OV not scheduled   Last refill 11/07/23 Qty #60/0

## 2023-12-18 MED ORDER — CLONAZEPAM 0.5 MG PO TABS: 0.5000 mg | ORAL_TABLET | Freq: Two times a day (BID) | ORAL | 0 refills | Status: DC | PRN

## 2023-12-20 ENCOUNTER — Encounter: Admitting: Gastroenterology

## 2023-12-24 ENCOUNTER — Other Ambulatory Visit: Payer: Self-pay | Admitting: Internal Medicine

## 2023-12-24 DIAGNOSIS — U071 COVID-19: Secondary | ICD-10-CM

## 2023-12-24 NOTE — Telephone Encounter (Signed)
 Medication sent to pharmacy

## 2024-01-08 ENCOUNTER — Encounter

## 2024-01-09 ENCOUNTER — Encounter

## 2024-01-09 ENCOUNTER — Ambulatory Visit: Admitting: Student

## 2024-01-09 ENCOUNTER — Ambulatory Visit: Payer: Self-pay

## 2024-01-09 ENCOUNTER — Other Ambulatory Visit: Payer: Self-pay | Admitting: Student

## 2024-01-09 ENCOUNTER — Encounter: Payer: Self-pay | Admitting: Student

## 2024-01-09 VITALS — BP 123/73 | HR 75 | Temp 98.2°F | Ht 60.0 in | Wt 167.5 lb

## 2024-01-09 DIAGNOSIS — B349 Viral infection, unspecified: Secondary | ICD-10-CM | POA: Diagnosis not present

## 2024-01-09 DIAGNOSIS — R051 Acute cough: Secondary | ICD-10-CM

## 2024-01-09 LAB — RESP PANEL BY RT-PCR (RSV, FLU A&B, COVID)  RVPGX2
Influenza A by PCR: NEGATIVE
Influenza B by PCR: NEGATIVE
Resp Syncytial Virus by PCR: NEGATIVE
SARS Coronavirus 2 by RT PCR: NEGATIVE

## 2024-01-09 NOTE — Progress Notes (Signed)
 Established Patient Office Visit  Subjective   Patient ID: Donna Davis, female    DOB: 20-Jan-1971  Age: 53 y.o. MRN: [id number]  Chief Complaint  Patient presents with   Rash    Rash on her face, painful feels like she is having a migraine    Muscle Pain   Nausea    Feels like she has the flu   Fatigue   Cough   Fever    Donna Davis is a 53 y.o. who presents to the clinic for symptoms consistent with a viral infection. Please see problem based assessment and plan for additional details.   Patient Active Problem List   Diagnosis Date Noted   Acute viral disease 01/09/2024   Screening for colon cancer 10/22/2023   Need for Tdap vaccination 10/22/2023   Need for pneumococcal vaccine 10/22/2023   Concussion with loss of consciousness 08/27/2023   Elevated blood pressure reading 07/17/2021   COVID-19 long hauler manifesting chronic fatigue 05/16/2021   Arthralgia of right temporomandibular joint 08/26/2018   Endometriosis 04/09/2018   Allergic rhinitis due to allergen 12/19/2016   Bilateral deafness 01/17/2016   Family history of breast cancer in mother 08/26/2015   Obesity (BMI 30-39.9) 02/18/2014   Migraine 12/02/2012   Routine adult health maintenance 09/01/2012   Anxiety state 09/03/2006   Hypothyroidism 07/01/2006   GERD 07/01/2006   IBS 07/01/2006      Objective:     BP 123/73 (BP Location: Left Arm, Patient Position: Sitting, Cuff Size: Normal)   Pulse 75   Temp 98.2 F (36.8 C) (Oral)   Ht 5' (1.524 m)   Wt 167 lb 8 oz (76 kg)   LMP 12/26/2004   SpO2 98%   BMI 32.71 kg/m  BP Readings from Last 3 Encounters:  01/09/24 123/73  11/07/23 (!) 142/94  10/23/23 (!) 130/90   Wt Readings from Last 3 Encounters:  01/09/24 167 lb 8 oz (76 kg)  11/07/23 190 lb (86.2 kg)  10/23/23 190 lb (86.2 kg)      Physical Exam Vitals reviewed.  Constitutional:      General: She is not in acute distress.    Appearance: She is not ill-appearing, toxic-appearing  or diaphoretic.  HENT:     Head: Normocephalic.     Mouth/Throat:     Lips: No lesions.     Mouth: Mucous membranes are moist.     Pharynx: Oropharynx is clear. No pharyngeal swelling, oropharyngeal exudate or posterior oropharyngeal erythema.     Tonsils: No tonsillar exudate or tonsillar abscesses.  Eyes:     Conjunctiva/sclera: Conjunctivae normal.  Neck:     Meningeal: Brudzinski's sign absent.     Comments: Neck stiffness is not present  Cardiovascular:     Rate and Rhythm: Normal rate and regular rhythm.     Heart sounds: Normal heart sounds. No murmur heard. Pulmonary:     Effort: Pulmonary effort is normal. No respiratory distress.     Breath sounds: Normal breath sounds. No stridor. No wheezing or rales.  Musculoskeletal:     Right lower leg: No edema.     Left lower leg: No edema.  Lymphadenopathy:     Cervical: No cervical adenopathy.  Skin:    General: Skin is warm and dry.     Findings: Rash (Petechial rash on B/L forhead L>R) present.  Neurological:     Mental Status: She is alert.      Results for orders placed or performed in  visit on 01/09/24  Resp panel by RT-PCR (RSV, Flu A&B, Covid) Anterior Nasal Swab   Specimen: Anterior Nasal Swab  Result Value Ref Range   SARS Coronavirus 2 by RT PCR NEGATIVE NEGATIVE   Influenza A by PCR NEGATIVE NEGATIVE   Influenza B by PCR NEGATIVE NEGATIVE   Resp Syncytial Virus by PCR NEGATIVE NEGATIVE    Last CBC Lab Results  Component Value Date   WBC 8.4 07/15/2023   HGB 13.5 07/15/2023   HCT 40.7 07/15/2023   MCV 92.7 07/15/2023   MCH 30.8 07/15/2023   RDW 11.6 07/15/2023   PLT 262 07/15/2023   Last metabolic panel Lab Results  Component Value Date   GLUCOSE 89 07/15/2023   NA 134 (L) 07/15/2023   K 3.8 07/15/2023   CL 104 07/15/2023   CO2 23 07/15/2023   BUN 6 07/15/2023   CREATININE 0.68 07/15/2023   GFRNONAA >60 07/15/2023   CALCIUM 8.8 (L) 07/15/2023   PROT 7.2 05/31/2022   ALBUMIN 4.5 05/31/2022    LABGLOB 2.7 05/31/2022   AGRATIO 1.7 05/31/2022   BILITOT <0.2 05/31/2022   ALKPHOS 66 05/31/2022   AST 14 05/31/2022   ALT 11 05/31/2022   ANIONGAP 7 07/15/2023   Last lipids Lab Results  Component Value Date   CHOL 185 06/13/2009   HDL 37 (L) 06/13/2009   LDLCALC 117 (H) 06/13/2009   TRIG 153 (H) 06/13/2009   CHOLHDL 5.0 Ratio 06/13/2009   Last hemoglobin A1c Lab Results  Component Value Date   HGBA1C 5.1 11/07/2009      The ASCVD Risk score (Arnett DK, et al., 2019) failed to calculate for the following reasons:   Cannot find a previous HDL lab   Cannot find a previous total cholesterol lab    Assessment & Plan:   Problem List Items Addressed This Visit       Other   Acute viral disease   Patient presents with symptoms of an acute viral infection. Patient reports a one day duration of fatigue, headache, facial rash, SOB, fatigue, cough, myalgias, sore throat. Per patient, she feels like she has a fever. She is unsure of sick contacts but does work a lot with the public. Her current headache is different from her baseline migraines, but she reports that she has had a headache like this in the past when she had Covid. The rash started on her forehead and is not itchy or painful.   Of note, she had the cochlear implant removed in October 2024 and denies recent intracranial surgery.   Exam reveals: a petechial rash of the forehead, no swelling of the tonsils or exudate, no cervical lymphadenopathy, and lungs were clear to auscultate.  Patient is up to date on her MMR and influenza vaccine per chart review.   Plan: -COVID, flu, RSV swab obtained  -Conservative care, additional medications pending RSV/FLU/COVID results       Other Visit Diagnoses       Acute cough    -  Primary   Relevant Orders   Resp panel by RT-PCR (RSV, Flu A&B, Covid) Anterior Nasal Swab (Completed)       Return if symptoms worsen or fail to improve.    Aurora Lees, DO

## 2024-01-09 NOTE — Assessment & Plan Note (Addendum)
 Patient presents with symptoms of an acute viral infection. Patient reports a one day duration of fatigue, headache, facial rash, SOB, fatigue, cough, myalgias, sore throat. Per patient, she feels like she has a fever. She is unsure of sick contacts but does work a lot with the public. Her current headache is different from her baseline migraines, but she reports that she has had a headache like this in the past when she had Covid. The rash started on her forehead and is not itchy or painful.   Of note, she had the cochlear implant removed in October 2024 and denies recent intracranial surgery.   Exam reveals: a petechial rash of the forehead, no swelling of the tonsils or exudate, no cervical lymphadenopathy, and lungs were clear to auscultate.  Patient is up to date on her MMR and influenza vaccine per chart review.   Plan: -COVID, flu, RSV swab obtained  -Conservative care, additional medications pending RSV/FLU/COVID results

## 2024-01-09 NOTE — Patient Instructions (Signed)
 Thank you, [name] for allowing us  to provide your care today. Today we discussed your symptoms of felling poorly.  It sound like you have an acute infection, we have collected the swab and call you when we get the results!  Continue to use tylenol , rest, and hydrate.   If your symptoms start to feel worse or if your shortness of breath worsens: please call the clinic to be seen!   I have ordered the following labs for you:   Lab Orders         Resp panel by RT-PCR (RSV, Flu A&B, Covid) Anterior Nasal Swab       Follow up: As needed if your symptoms do not resolve    Should you have any questions or concerns please call the internal medicine clinic at [phone].     Please note that our late policy has changed.  If you are more than 15 minutes late to your appointment, you may be asked to reschedule your appointment.  Dr. Sharlon Deacon, D.O. [facility]

## 2024-01-09 NOTE — Telephone Encounter (Signed)
  Chief Complaint: Headache Symptoms: headache, rash, body aches Frequency: started yesterday Pertinent Negatives: Patient denies CP, SOB Disposition: [] ED /[] Urgent Care (no appt availability in office) / [] Appointment(In office/virtual)/ []  [facility]/ [] Home Care/ [] Refused Recommended Disposition /[] [facility]/ []  Follow-up with PCP Additional Notes: patient calling with sign language interpreter [phone] on the phone. Patient reports headache with rash to top of forehead and side of face. Endorses generalized body aches. Patient is requesting an appointment today or tomorrow. No availability via decision tree. Discussed with patient the use of urgent care or ED but patient refused both suggestions due to cost. Patient is needing a call back for an appointment. Patient is asking to be called at [phone] Verbalized understanding of plan and all questions answered.    Copied from CRM [id number]. Topic: Clinical - Red Word Triage >> Jan 09, 2024 10:46 AM Blair Bumpers wrote: Red Word that prompted transfer to Nurse Triage: Patient states she is coughing & have a rash along the top of her forehead & side of face. Also has a slight fever. Patient states her muscles are aching, headaches, and in pain all over. Reason for Disposition  [1] SEVERE headache (e.g., excruciating) AND [2] not improved after 2 hours of pain medicine  Answer Assessment - Initial Assessment Questions 1. LOCATION: "Where does it hurt?"      All over 2. ONSET: "When did the headache start?" (Minutes, hours or days)      Started yesterday afternoon 3. PATTERN: "Does the pain come and go, or has it been constant since it started?"     constant 4. SEVERITY: "How bad is the pain?" and "What does it keep you from doing?"  (e.g., Scale 1-10; mild, moderate, or severe)   - MILD (1-3): doesn't interfere with normal activities    - MODERATE (4-7): interferes with normal activities or awakens from sleep    -  SEVERE (8-10): excruciating pain, unable to do any normal activities        10 5. RECURRENT SYMPTOM: "Have you ever had headaches before?" If Yes, ask: "When was the last time?" and "What happened that time?"      yes 6. CAUSE: "What do you think is causing the headache?"     unsure 7. MIGRAINE: "Have you been diagnosed with migraine headaches?" If Yes, ask: "Is this headache similar?"      yes 8. HEAD INJURY: "Has there been any recent injury to the head?"      Migraine from TBI 9. OTHER SYMPTOMS: "Do you have any other symptoms?" (fever, stiff neck, eye pain, sore throat, cold symptoms)     Rash on side of face, cough, body aches  Protocols used: Headache-A-AH

## 2024-01-09 NOTE — Telephone Encounter (Signed)
 I called pt to rescheduled her an appt for today, no answer. LVM with sign language interpreter for pt to call the clinic back.

## 2024-01-14 ENCOUNTER — Other Ambulatory Visit: Payer: Self-pay | Admitting: Internal Medicine

## 2024-01-14 DIAGNOSIS — G43719 Chronic migraine without aura, intractable, without status migrainosus: Secondary | ICD-10-CM

## 2024-01-14 MED ORDER — TRAMADOL HCL 50 MG PO TABS: ORAL_TABLET | ORAL | 0 refills | Status: DC

## 2024-01-17 ENCOUNTER — Encounter: Payer: Self-pay | Admitting: Gastroenterology

## 2024-01-17 ENCOUNTER — Ambulatory Visit (AMBULATORY_SURGERY_CENTER): Admitting: Gastroenterology

## 2024-01-17 VITALS — BP 128/72 | HR 63 | Temp 97.9°F | Resp 13 | Ht 60.0 in | Wt 167.0 lb

## 2024-01-17 DIAGNOSIS — K580 Irritable bowel syndrome with diarrhea: Secondary | ICD-10-CM

## 2024-01-17 DIAGNOSIS — D122 Benign neoplasm of ascending colon: Secondary | ICD-10-CM | POA: Diagnosis not present

## 2024-01-17 DIAGNOSIS — D120 Benign neoplasm of cecum: Secondary | ICD-10-CM

## 2024-01-17 DIAGNOSIS — K5730 Diverticulosis of large intestine without perforation or abscess without bleeding: Secondary | ICD-10-CM

## 2024-01-17 DIAGNOSIS — Z1211 Encounter for screening for malignant neoplasm of colon: Secondary | ICD-10-CM

## 2024-01-17 MED ORDER — SODIUM CHLORIDE 0.9 % IV SOLN
500.0000 mL | Freq: Once | INTRAVENOUS | Status: DC
Start: 2024-01-17 — End: 2024-01-17

## 2024-01-17 NOTE — Progress Notes (Signed)
 VS by DT  Pt's states no medical or surgical changes since previsit or office visit.  Interpreter used today at the [facility] for this pt.  Interpreter's name is- [employer]

## 2024-01-17 NOTE — Patient Instructions (Signed)
Resume previous diet Continue present medications Await pathology results  Handouts/information given for polyps, diverticulosis  YOU HAD AN ENDOSCOPIC PROCEDURE TODAY AT THE [facility]:   Refer to the procedure report that was given to you for any specific questions about what was found during the examination.  If the procedure report does not answer your questions, please call your gastroenterologist to clarify.  If you requested that your care partner not be given the details of your procedure findings, then the procedure report has been included in a sealed envelope for you to review at your convenience later.  YOU SHOULD EXPECT: Some feelings of bloating in the abdomen. Passage of more gas than usual.  Walking can help get rid of the air that was put into your GI tract during the procedure and reduce the bloating. If you had a lower endoscopy (such as a colonoscopy or flexible sigmoidoscopy) you may notice spotting of blood in your stool or on the toilet paper. If you underwent a bowel prep for your procedure, you may not have a normal bowel movement for a few days.  Please Note:  You might notice some irritation and congestion in your nose or some drainage.  This is from the oxygen used during your procedure.  There is no need for concern and it should clear up in a day or so.  SYMPTOMS TO REPORT IMMEDIATELY:  Following lower endoscopy (colonoscopy):  Excessive amounts of blood in the stool  Significant tenderness or worsening of abdominal pains  Swelling of the abdomen that is new, acute  Fever of 100F or higher  For urgent or emergent issues, a gastroenterologist can be reached at any hour by calling [phone]. Do not use MyChart messaging for urgent concerns.    DIET:  We do recommend a small meal at first, but then you may proceed to your regular diet.  Drink plenty of fluids but you should avoid alcoholic beverages for 24 hours.  ACTIVITY:  You should plan to  take it easy for the rest of today and you should NOT DRIVE or use heavy machinery until tomorrow (because of the sedation medicines used during the test).    FOLLOW UP: Our staff will call the number listed on your records the next business day following your procedure.  We will call around 7:15- 8:00 am to check on you and address any questions or concerns that you may have regarding the information given to you following your procedure. If we do not reach you, we will leave a message.     If any biopsies were taken you will be contacted by phone or by letter within the next 1-3 weeks.  Please call us at [phone] if you have not heard about the biopsies in 3 weeks.    SIGNATURES/CONFIDENTIALITY: You and/or your care partner have signed paperwork which will be entered into your electronic medical record.  These signatures attest to the fact that that the information above on your After Visit Summary has been reviewed and is understood.  Full responsibility of the confidentiality of this discharge information lies with you and/or your care-partner. 

## 2024-01-17 NOTE — Progress Notes (Signed)
 [facility] History and Physical   Primary Care Physician:  Bevelyn Bryant, MD   Reason for Procedure:   Colon cancer screening  Plan:    Screening colonoscopy     HPI: Donna Davis is a 53 y.o. female undergoing average risk screening colonoscopy.  She has no family history of colon cancer.  She has long standing mixed IBS symptoms which are unchanged from her visit in Feb 2025. Her last colonoscopy was in 2013 and was normal, including random biopsies.   Past Medical History:  Diagnosis Date   Amitriptyline  adverse reaction 04/08/2013   Anxiety    Clostridium difficile infection    x several times   Deaf    uses sign language   Endometriosis    s/p TAH, LSO   Family history of malignant neoplasm of gastrointestinal tract    Gallstones    GERD (gastroesophageal reflux disease)    Hearing difficulty    b/l, 2/2 congenital rubella s/p stapedectomy. Using hearing aid   Hepatic cyst    6 mm on CT done done in 05/2005   Hypothyroidism    IBS (irritable bowel syndrome)    Migraines    "frequency depends on my stress level" (07/23/2017)   Obesity    PONV (postoperative nausea and vomiting)    "sometimes; depends on how long I'm under"   Shingles 03/2013    Past Surgical History:  Procedure Laterality Date   ABDOMINAL ADHESION SURGERY     Enterolysis   CESAREAN SECTION  1994   CHOLECYSTECTOMY N/A 06/26/2013   Procedure: LAPAROSCOPIC CHOLECYSTECTOMY WITH ATTEMPTED INTRAOPERATIVE CHOLANGIOGRAM;  Surgeon: Rogena Class, MD;  Location: [facility];  Service: General;  Laterality: N/A;   COCHLEAR IMPLANT  03/30/2016   COCHLEAR IMPLANT Right 03/2015   COLONOSCOPY WITH PROPOFOL   04/23/2012   DIAGNOSTIC LAPAROSCOPY  01/14/2006   "I've had several" (07/23/2017)   ESOPHAGOGASTRODUODENOSCOPY (EGD) WITH PROPOFOL   04/23/2012   JOINT REPLACEMENT     KNEE SURGERY Right 2014   LAPAROSCOPIC OVARIAN CYSTECTOMY Right 04/16/2000   SALPINGOOPHORECTOMY Left    STAPEDECTOMY Left  05/14/2006   Thyroid  Nodule removed  1997   TONSILLECTOMY     TOTAL ABDOMINAL HYSTERECTOMY  01/13/2004   Endometriosis with residual right ovary. Multiple GYN surgeries for chronic pelvic pain; had LSO in past   TUBAL LIGATION     WOUND EXPLORATION Right 10/16/2016   Procedure: RIGHT EXPLORATION OF LACERATION OF INDEX FINGER;  Surgeon: Brunilda Capra, MD;  Location: [facility];  Service: Orthopedics;  Laterality: Right;    Prior to Admission medications   Medication Sig Start Date End Date Taking? Authorizing Provider  busPIRone  (BUSPAR ) 10 MG tablet TAKE 2 TABLETS BY MOUTH 3 TIMES A DAY 05/31/23  Yes Ether Hercules, MD  clonazePAM  (KLONOPIN ) 0.5 MG tablet Take 1 tablet (0.5 mg total) by mouth 2 (two) times daily as needed for anxiety. 12/18/23  Yes Syliva Even, MD  estradiol  (ESTRACE ) 1 MG tablet TAKE 1 TABLET BY MOUTH EVERY DAY 04/09/23  Yes Rosezetta Contras, MD  fluticasone  (FLONASE ) 50 MCG/ACT nasal spray SPRAY 1 SPRAY INTO EACH NOSTRIL DAILY 12/24/23  Yes Bevelyn Bryant, MD  levothyroxine  (SYNTHROID ) 25 MCG tablet TAKE 1 TABLET BY MOUTH EVERY DAY 08/26/23  Yes Bevelyn Bryant, MD  omeprazole  (PRILOSEC) 20 MG capsule Take 20 mg by mouth 2 (two) times daily.   Yes [provider]  promethazine  (PHENERGAN ) 12.5 MG tablet Take 1 tablet (12.5 mg total) by mouth  every 6 (six) hours as needed for nausea or vomiting. 10/19/22  Yes Rosezetta Contras, MD  traMADol  (ULTRAM ) 50 MG tablet Take 1 tablet (50 mg) by mouth every 6 (six) hours as needed for severe pain. 01/14/24  Yes Bevelyn Bryant, MD  Ascorbic Acid (VITAMIN C PO) Take 1 tablet by mouth daily. Patient not taking: Reported on 01/17/2024    [provider]  dicyclomine  (BENTYL ) 10 MG capsule Take 1 capsule (10 mg total) by mouth daily as needed for spasms. Patient not taking: Reported on 01/17/2024 11/17/23   Elois Hair, MD  pantoprazole  (PROTONIX ) 40 MG tablet Take 1 tablet (40 mg total) by mouth daily. 03/27/23 03/26/24   Rosezetta Contras, MD    Current Outpatient Medications  Medication Sig Dispense Refill   busPIRone  (BUSPAR ) 10 MG tablet TAKE 2 TABLETS BY MOUTH 3 TIMES A DAY 540 tablet 2   clonazePAM  (KLONOPIN ) 0.5 MG tablet Take 1 tablet (0.5 mg total) by mouth 2 (two) times daily as needed for anxiety. 60 tablet 0   estradiol  (ESTRACE ) 1 MG tablet TAKE 1 TABLET BY MOUTH EVERY DAY 90 tablet 3   fluticasone  (FLONASE ) 50 MCG/ACT nasal spray SPRAY 1 SPRAY INTO EACH NOSTRIL DAILY 16 mL 2   levothyroxine  (SYNTHROID ) 25 MCG tablet TAKE 1 TABLET BY MOUTH EVERY DAY 90 tablet 3   omeprazole  (PRILOSEC) 20 MG capsule Take 20 mg by mouth 2 (two) times daily.     promethazine  (PHENERGAN ) 12.5 MG tablet Take 1 tablet (12.5 mg total) by mouth every 6 (six) hours as needed for nausea or vomiting. 30 tablet 0   traMADol  (ULTRAM ) 50 MG tablet Take 1 tablet (50 mg) by mouth every 6 (six) hours as needed for severe pain. 40 tablet 0   Ascorbic Acid (VITAMIN C PO) Take 1 tablet by mouth daily. (Patient not taking: Reported on 01/17/2024)     dicyclomine  (BENTYL ) 10 MG capsule Take 1 capsule (10 mg total) by mouth daily as needed for spasms. (Patient not taking: Reported on 01/17/2024) 90 capsule 0   pantoprazole  (PROTONIX ) 40 MG tablet Take 1 tablet (40 mg total) by mouth daily. 90 tablet 3   Current Facility-Administered Medications  Medication Dose Route Frequency Provider Last Rate Last Admin   0.9 %  sodium chloride  infusion  500 mL Intravenous Once Elois Hair, MD        Allergies as of 01/17/2024 - Review Complete 01/17/2024  Allergen Reaction Noted   Onion Anaphylaxis 11/28/2010   Other Other (See Comments) 02/29/2012   Shellfish allergy Anaphylaxis and Swelling 09/01/2012   Amitriptyline  Anxiety, Palpitations, and Other (See Comments) 04/08/2013   Amoxicillin -pot clavulanate Nausea And Vomiting    Amoxicillin -pot clavulanate Nausea And Vomiting 03/04/2016   Cefuroxime Nausea And Vomiting 08/06/2008    Cephalexin  Other (See Comments)    Ciprofloxacin  Other (See Comments)    Clarithromycin Nausea And Vomiting and Other (See Comments)    Clarithromycin Nausea And Vomiting 11/28/2010   Doxycycline  Other (See Comments) and Hives    Propranolol  Other (See Comments) 01/07/2013   Sulfa antibiotics Other (See Comments) 03/04/2016   Sulfasalazine Itching 03/04/2016   Sulfonamide derivatives Other (See Comments)    Benadryl  [diphenhydramine  hcl (sleep)] Anxiety 06/26/2013   Shrimp flavor agent (non-screening) Other (See Comments) 11/28/2010    Family History  Problem Relation Age of Onset   Breast cancer Mother    Ovarian cancer Mother    Prostate cancer Father    Diabetes Father  Heart disease Father        heart attack   Irritable bowel syndrome Father    Kidney disease Father    Colon polyps Father    Stomach cancer Maternal Grandmother    Colon cancer Maternal Grandmother    Diabetes Maternal Grandmother    Bipolar disorder Daughter    Heart disease Other        both grandmothers   Esophageal cancer Neg Hx    Liver disease Neg Hx     Social History   Socioeconomic History   Marital status: Married    Spouse name: Not on file   Number of children: 2   Years of education: Not on file   Highest education level: Not on file  Occupational History   Occupation: Immunologist    Comment: works in [place]   Tobacco Use   Smoking status: Former    Current packs/day: 0.00    Average packs/day: 1 pack/day for 26.0 years (26.0 ttl pk-yrs)    Types: Cigarettes    Start date: 02/17/1981    Quit date: 02/18/2007    Years since quitting: 16.9   Smokeless tobacco: Never   Tobacco comments:    quit 2008  Vaping Use   Vaping status: Never Used  Substance and Sexual Activity   Alcohol use: Yes    Comment: Occasionally.   Drug use: No   Sexual activity: Not Currently    Partners: Male    Birth control/protection: Surgical  Other Topics Concern   Not on file  Social  History Narrative   Current smoker, not using alcohol or drugs at this time   Lives with husband and 2 children   Social Drivers of Corporate investment banker Strain: Not on file  Food Insecurity: Low Risk  (06/27/2023)   Received from [facility]   Hunger Vital Sign    Worried About Running Out of Food in the Last Year: Never true    Ran Out of Food in the Last Year: Never true  Transportation Needs: No Transportation Needs (06/27/2023)   Received from [facility]    In the past 12 months, has lack of reliable transportation kept you from medical appointments, meetings, work or from getting things needed for daily living? : No  Physical Activity: Not on file  Stress: Not on file  Social Connections: Not on file  Intimate Partner Violence: Not At Risk (01/01/2023)   Humiliation, Afraid, Rape, and Kick questionnaire    Fear of Current or Ex-Partner: No    Emotionally Abused: No    Physically Abused: No    Sexually Abused: No    Review of Systems:  All other review of systems negative except as mentioned in the HPI.  Physical Exam: Vital signs BP 109/80   Pulse 70   Temp 97.9 F (36.6 C)   Ht 5' (1.524 m)   Wt 167 lb (75.8 kg)   LMP 12/26/2004   SpO2 95%   BMI 32.61 kg/m   General:   Alert,  Well-developed, well-nourished, pleasant and cooperative in NAD Airway:  Mallampati 2 Lungs:  Clear throughout to auscultation.   Heart:  Regular rate and rhythm; no murmurs, clicks, rubs,  or gallops. Abdomen:  Soft, nontender and nondistended. Normal bowel sounds.   Neuro/Psych:  Normal mood and affect. A and O x 3   Donna Davis E. Cherryl Corona, MD [facility]

## 2024-01-17 NOTE — Progress Notes (Signed)
 Internal Medicine Clinic Attending  Case discussed with the resident at the time of the visit.  We reviewed the resident's history and exam and pertinent patient test results.  I agree with the assessment, diagnosis, and plan of care documented in the resident's note.

## 2024-01-17 NOTE — Progress Notes (Signed)
 Called to room to assist during endoscopic procedure.  Patient ID and intended procedure confirmed with present staff. Received instructions for my participation in the procedure from the performing physician.

## 2024-01-17 NOTE — Op Note (Signed)
 [facility] Patient Name: Donna Davis Procedure Date: 01/17/2024 2:18 PM MRN: [id number] Endoscopist: Geralyn Knee E. Cherryl Corona , MD, [id number] Age: 53 Referring MD:  Date of Birth: Jan 24, 1971 Gender: Female Account #: [id number] Procedure:                Colonoscopy Indications:              Screening for colorectal malignant neoplasm (last                            colonoscopy was more than 10 years ago) Medicines:                Monitored Anesthesia Care Procedure:                Pre-Anesthesia Assessment:                           - Prior to the procedure, a History and Physical                            was performed, and patient medications and                            allergies were reviewed. The patient's tolerance of                            previous anesthesia was also reviewed. The risks                            and benefits of the procedure and the sedation                            options and risks were discussed with the patient.                            All questions were answered, and informed consent                            was obtained. Prior Anticoagulants: The patient has                            taken no anticoagulant or antiplatelet agents. ASA                            Grade Assessment: II - A patient with mild systemic                            disease. After reviewing the risks and benefits,                            the patient was deemed in satisfactory condition to                            undergo the procedure.  After obtaining informed consent, the colonoscope                            was passed under direct vision. Throughout the                            procedure, the patient's blood pressure, pulse, and                            oxygen saturations were monitored continuously. The                            Olympus Scope [id number] was introduced through the                            anus and  advanced to the the terminal ileum, with                            identification of the appendiceal orifice and IC                            valve. The colonoscopy was performed without                            difficulty. The patient tolerated the procedure                            well. The quality of the bowel preparation was                            excellent. The terminal ileum, ileocecal valve,                            appendiceal orifice, and rectum were photographed.                            The bowel preparation used was SUFLAVE  via split                            dose instruction. Scope In: 2:19:08 PM Scope Out: 2:34:52 PM Scope Withdrawal Time: 0 hours 11 minutes 6 seconds  Total Procedure Duration: 0 hours 15 minutes 44 seconds  Findings:                 The perianal and digital rectal examinations were                            normal. Pertinent negatives include normal                            sphincter tone and no palpable rectal lesions.                           A 3 mm polyp was found in the cecum. The polyp was  flat. The polyp was removed with a cold snare.                            Resection and retrieval were complete. Estimated                            blood loss was minimal.                           A 4 mm polyp was found in the ascending colon. The                            polyp was sessile. The polyp was removed with a                            cold snare. Resection and retrieval were complete.                            Estimated blood loss was minimal.                           Multiple small-mouthed diverticula were found in                            the sigmoid colon and descending colon.                           The exam was otherwise normal throughout the                            examined colon.                           The terminal ileum appeared normal.                           The retroflexed view  of the distal rectum and anal                            verge was normal and showed no anal or rectal                            abnormalities. Complications:            No immediate complications. Estimated Blood Loss:     Estimated blood loss was minimal. Impression:               - One 3 mm polyp in the cecum, removed with a cold                            snare. Resected and retrieved.                           - One 4 mm polyp in the ascending colon, removed  with a cold snare. Resected and retrieved.                           - Mild diverticulosis in the sigmoid colon and in                            the descending colon.                           - The examined portion of the ileum was normal.                           - The distal rectum and anal verge are normal on                            retroflexion view. Recommendation:           - Patient has a contact number available for                            emergencies. The signs and symptoms of potential                            delayed complications were discussed with the                            patient. Return to normal activities tomorrow.                            Written discharge instructions were provided to the                            patient.                           - Resume previous diet.                           - Continue present medications.                           - Await pathology results.                           - Repeat colonoscopy (date not yet determined) for                            surveillance based on pathology results. Pericles Carmicheal E. Cherryl Corona, MD 01/17/2024 2:47:46 PM This report has been signed electronically.

## 2024-01-17 NOTE — Progress Notes (Signed)
 Sedate, gd SR, tolerated procedure well, VSS, report to RN

## 2024-01-20 ENCOUNTER — Telehealth: Payer: Self-pay | Admitting: *Deleted

## 2024-01-20 NOTE — Telephone Encounter (Signed)
 Returned pt's call. She states she is having "severe soreness all over her abdomen but it is worse on the right side. It feels like my colon is raw, like rubbing alcohol was rubbed on my colon." Describes the pain as burning and rates it an 8/10. She has not had a BM since her procedure on Friday, denies a history of constipation as she reports she normally has diarrhea. Also reports that due to the pain her activity has been very limited as she does not want to move too much because of how bad her stomach feels. Also reports nausea with no vomiting, but has had heaving. Has not been able to eat very much since the procedure. States she ate a chicken bowl and some rice with little to no seasoning. States she has been able to drink fluids and has made sure she is drinking plenty of water. Pt denies taking any medication to help with the discomfort. RN noted she has dicyclomine  on her medication list and asked if she had taken that. Pt states she has not taken it because the MD said they would discuss about continuing that medication and also it does not feel like the same cramping feeling for which she would take it before. Colonoscopy report uneventful to explain pt's symptoms. Only recommendation RN can provide at this time is to try the dicyclomine  to see if it helps. Also instructed pt that if pain continues or worsens we would recommend she seek care at an Urgent Care or ED. Instructed pt that on call MD will be made aware and if there are any further recommendations the MD will call back as the office is closed at this time. Pt verbalized understanding and requested that if we do call her back to call [phone].

## 2024-01-20 NOTE — Telephone Encounter (Signed)
 Post procedure follow up call placed, no answer and left VM.

## 2024-01-20 NOTE — Telephone Encounter (Signed)
 PT returning call. PT feels very "lousy". She has an unsetted stomach, more than normal soreness. She can't twist her torso side to side. She is very concerned and would like to discuss her symptoms.

## 2024-01-21 NOTE — Telephone Encounter (Signed)
 Attempted to call pt. No answer. Left detailed message communicating the MD's recommendations.

## 2024-01-22 LAB — SURGICAL PATHOLOGY

## 2024-01-23 ENCOUNTER — Ambulatory Visit: Payer: Self-pay

## 2024-01-23 NOTE — Telephone Encounter (Signed)
 Copied from CRM [id number]. Topic: Clinical - Medical Advice >> Jan 23, 2024 11:32 AM Donna Davis F wrote: Reason for CRM:   Patient went to the migraine clinic this morning and had a horrible experience; She felt there was no mutual respect in the interaction and she felt extremely dismissed by his disregard for her not wanting to take certain medication due to them effecting her being able to work regularly; Patient would like a second opinion on another migraine facility in hopes of a better experience. Her migraine is constant and is in need of assistance but declined nurse triage transfer.  Patient would like a callback: [phone] Declined triage;

## 2024-01-24 ENCOUNTER — Encounter: Payer: Self-pay | Admitting: Neurology

## 2024-01-24 ENCOUNTER — Other Ambulatory Visit: Payer: Self-pay | Admitting: Internal Medicine

## 2024-01-24 DIAGNOSIS — G43719 Chronic migraine without aura, intractable, without status migrainosus: Secondary | ICD-10-CM

## 2024-01-24 NOTE — Telephone Encounter (Signed)
 Returned patient's call and reviewed dissatisfaction with her appointment at the [facility], Dr. Margie Sheller. She would like an alternate referral. She requires a provider under the CPP plan with insurance, two other alternatives in the area. I will send to our referral coordinator for further assistance.

## 2024-01-25 ENCOUNTER — Encounter: Payer: Self-pay | Admitting: Gastroenterology

## 2024-01-25 NOTE — Progress Notes (Signed)
 Donna Davis,  The two polyps which I removed during your recent procedure were proven to be completely benign but are considered "pre-cancerous" polyps that MAY have grown into cancer if they had not been removed.  Studies shows that at least 20% of women over age 55 and 30% of men over age 31 have pre-cancerous polyps.  Based on current nationally recognized surveillance guidelines, I recommend that you have a repeat colonoscopy in 7 years.   If you develop any new rectal bleeding, abdominal pain or significant bowel habit changes, please contact me before then.

## 2024-01-27 ENCOUNTER — Other Ambulatory Visit: Payer: Self-pay | Admitting: Family Medicine

## 2024-01-27 MED ORDER — CLONAZEPAM 0.5 MG PO TABS: 0.5000 mg | ORAL_TABLET | Freq: Two times a day (BID) | ORAL | 0 refills | Status: DC | PRN

## 2024-01-27 NOTE — Telephone Encounter (Signed)
 Last OV 11/07/23 Next OV not scheduled  Last refill 12/18/23 Qty #60/0

## 2024-01-28 MED ORDER — CLONAZEPAM 0.5 MG PO TABS: 0.5000 mg | ORAL_TABLET | Freq: Two times a day (BID) | ORAL | 0 refills | Status: DC | PRN

## 2024-01-28 NOTE — Telephone Encounter (Signed)
 Patient called regarding this message for a refill. She states that mychart told her to contact her doctor. It looks like it was printed and not sent to pharmacy possibly.

## 2024-02-10 ENCOUNTER — Other Ambulatory Visit: Payer: Self-pay | Admitting: Gastroenterology

## 2024-02-10 DIAGNOSIS — K580 Irritable bowel syndrome with diarrhea: Secondary | ICD-10-CM

## 2024-02-11 ENCOUNTER — Telehealth: Payer: Self-pay

## 2024-02-11 NOTE — Telephone Encounter (Signed)
 CoverMyMeds.com KEY: [id number]  Emgality  120 mg/mL

## 2024-02-11 NOTE — Telephone Encounter (Signed)
 Prior Authorization initiated for EMGALITY  via CoverMyMeds.com KEY: [id number]

## 2024-02-12 ENCOUNTER — Ambulatory Visit
Admission: EM | Admit: 2024-02-12 | Discharge: 2024-02-12 | Disposition: A | Discharge status: Home / Self Care | Attending: Family Medicine | Admitting: Family Medicine

## 2024-02-12 DIAGNOSIS — L03221 Cellulitis of neck: Secondary | ICD-10-CM

## 2024-02-12 MED ORDER — CLINDAMYCIN HCL 300 MG PO CAPS: 300.0000 mg | ORAL_CAPSULE | Freq: Three times a day (TID) | ORAL | 0 refills | Status: DC

## 2024-02-12 NOTE — ED Triage Notes (Signed)
 Pt states that she has a rash on the back of her neck. X2 days Pt states that she also has some fatigue. X2 days Pt states that the rash is itchy and painful to the touch.

## 2024-02-12 NOTE — Telephone Encounter (Signed)
 We have partially approved your request for this drug up to the amount your plan covers 1 syringe or pen per month of Emgality  120 mg.

## 2024-02-12 NOTE — ED Provider Notes (Signed)
 [facility]  Note:  This document was prepared using Conservation officer, historic buildings and may include unintentional dictation errors.  MRN: [id number] DOB: 1970/10/02  Subjective:   Donna Davis is a 53 y.o. female presenting for 2-day history of acute onset persistent and worsening painful red rash that has been oozing over the back of her neck.  No fever, swelling.  No known inciting factors.  No history of dermatologic conditions.  No current facility-administered medications for this encounter.  Current Outpatient Medications:    Ascorbic Acid (VITAMIN C PO), Take 1 tablet by mouth daily., Disp: , Rfl:    busPIRone  (BUSPAR ) 10 MG tablet, TAKE 2 TABLETS BY MOUTH 3 TIMES A DAY, Disp: 540 tablet, Rfl: 2   clonazePAM  (KLONOPIN ) 0.5 MG tablet, Take 1 tablet (0.5 mg total) by mouth 2 (two) times daily as needed for anxiety., Disp: 60 tablet, Rfl: 0   dicyclomine  (BENTYL ) 10 MG capsule, TAKE 1 CAPSULE (10 MG TOTAL) BY MOUTH DAILY AS NEEDED FOR SPASMS., Disp: 90 capsule, Rfl: 0   estradiol  (ESTRACE ) 1 MG tablet, TAKE 1 TABLET BY MOUTH EVERY DAY, Disp: 90 tablet, Rfl: 3   fluticasone  (FLONASE ) 50 MCG/ACT nasal spray, SPRAY 1 SPRAY INTO EACH NOSTRIL DAILY, Disp: 16 mL, Rfl: 2   levothyroxine  (SYNTHROID ) 25 MCG tablet, TAKE 1 TABLET BY MOUTH EVERY DAY, Disp: 90 tablet, Rfl: 3   omeprazole  (PRILOSEC) 20 MG capsule, Take 20 mg by mouth 2 (two) times daily., Disp: , Rfl:    pantoprazole  (PROTONIX ) 40 MG tablet, Take 1 tablet (40 mg total) by mouth daily., Disp: 90 tablet, Rfl: 3   traMADol  (ULTRAM ) 50 MG tablet, Take 1 tablet (50 mg) by mouth every 6 (six) hours as needed for severe pain., Disp: 40 tablet, Rfl: 0   promethazine  (PHENERGAN ) 12.5 MG tablet, Take 1 tablet (12.5 mg total) by mouth every 6 (six) hours as needed for nausea or vomiting., Disp: 30 tablet, Rfl: 0   Allergies  Allergen Reactions   Onion Anaphylaxis   Other Other (See Comments)    Ossmo Prep - drop  in BP Steroids Went crazy   Shellfish Allergy Anaphylaxis and Swelling    *pt denies    Amitriptyline  Anxiety, Palpitations and Other (See Comments)    hallucinations   Amoxicillin -Pot Clavulanate Nausea And Vomiting    With high dose, low dose ok   Amoxicillin -Pot Clavulanate Nausea And Vomiting    With high dose, low dose ok   Cefuroxime Nausea And Vomiting   Cephalexin  Other (See Comments)     gi upset  gi upset   Ciprofloxacin  Other (See Comments)     Fevers   Clarithromycin Nausea And Vomiting and Other (See Comments)    Pt is OK with azithromycin   gi upset   Clarithromycin Nausea And Vomiting    Pt is OK with azithromycin    Doxycycline  Other (See Comments) and Hives     gi upset  gi upset   Propranolol  Other (See Comments)    Fatigue and makes her "feel crazy", refuses to take.   Sulfa Antibiotics Other (See Comments)    Rash, vaginal blisters   Sulfasalazine Itching    Rash, vaginal blisters Rash, vaginal blisters    Sulfonamide Derivatives Other (See Comments)    vaginal blisters   Benadryl  [Diphenhydramine  Hcl (Sleep)] Anxiety    Extreme agitation   Shrimp Flavor Agent (Non-Screening) Other (See Comments)    unknown    Past Medical History:  Diagnosis Date   Amitriptyline  adverse reaction 04/08/2013   Anxiety    Clostridium difficile infection    x several times   Deaf    uses sign language   Endometriosis    s/p TAH, LSO   Family history of malignant neoplasm of gastrointestinal tract    Gallstones    GERD (gastroesophageal reflux disease)    Hearing difficulty    b/l, 2/2 congenital rubella s/p stapedectomy. Using hearing aid   Hepatic cyst    6 mm on CT done done in 05/2005   Hypothyroidism    IBS (irritable bowel syndrome)    Migraines    "frequency depends on my stress level" (07/23/2017)   Obesity    PONV (postoperative nausea and vomiting)    "sometimes; depends on how long I'm under"   Shingles 03/2013     Past Surgical History:   Procedure Laterality Date   ABDOMINAL ADHESION SURGERY     Enterolysis   CESAREAN SECTION  1994   CHOLECYSTECTOMY N/A 06/26/2013   Procedure: LAPAROSCOPIC CHOLECYSTECTOMY WITH ATTEMPTED INTRAOPERATIVE CHOLANGIOGRAM;  Surgeon: Rogena Class, MD;  Location: [facility];  Service: General;  Laterality: N/A;   COCHLEAR IMPLANT  03/30/2016   COCHLEAR IMPLANT Right 03/2015   COLONOSCOPY WITH PROPOFOL   04/23/2012   DIAGNOSTIC LAPAROSCOPY  01/14/2006   "I've had several" (07/23/2017)   ESOPHAGOGASTRODUODENOSCOPY (EGD) WITH PROPOFOL   04/23/2012   JOINT REPLACEMENT     KNEE SURGERY Right 2014   LAPAROSCOPIC OVARIAN CYSTECTOMY Right 04/16/2000   SALPINGOOPHORECTOMY Left    STAPEDECTOMY Left 05/14/2006   Thyroid  Nodule removed  1997   TONSILLECTOMY     TOTAL ABDOMINAL HYSTERECTOMY  01/13/2004   Endometriosis with residual right ovary. Multiple GYN surgeries for chronic pelvic pain; had LSO in past   TUBAL LIGATION     WOUND EXPLORATION Right 10/16/2016   Procedure: RIGHT EXPLORATION OF LACERATION OF INDEX FINGER;  Surgeon: Brunilda Capra, MD;  Location: [facility];  Service: Orthopedics;  Laterality: Right;    Family History  Problem Relation Age of Onset   Breast cancer Mother    Ovarian cancer Mother    Prostate cancer Father    Diabetes Father    Heart disease Father        heart attack   Irritable bowel syndrome Father    Kidney disease Father    Colon polyps Father    Stomach cancer Maternal Grandmother    Colon cancer Maternal Grandmother    Diabetes Maternal Grandmother    Bipolar disorder Daughter    Heart disease Other        both grandmothers   Esophageal cancer Neg Hx    Liver disease Neg Hx     Social History   Tobacco Use   Smoking status: Former    Current packs/day: 0.00    Average packs/day: 1 pack/day for 26.0 years (26.0 ttl pk-yrs)    Types: Cigarettes    Start date: 02/17/1981    Quit date: 02/18/2007    Years since quitting: 16.9   Smokeless  tobacco: Never   Tobacco comments:    quit 2008  Vaping Use   Vaping status: Never Used  Substance Use Topics   Alcohol use: Yes    Comment: Occasionally.   Drug use: No    ROS   Objective:   Vitals: BP (!) 154/80 (BP Location: Right Arm)   Pulse 76   Temp 98.7 F (37.1 C) (Oral)   Resp 17  Ht 5' (1.524 m)   Wt 190 lb (86.2 kg)   LMP 12/26/2004   SpO2 97%   BMI 37.11 kg/m   Physical Exam Constitutional:      General: She is not in acute distress.    Appearance: Normal appearance. She is well-developed. She is not ill-appearing, toxic-appearing or diaphoretic.  HENT:     Head: Normocephalic and atraumatic.     Nose: Nose normal.     Mouth/Throat:     Mouth: Mucous membranes are moist.  Eyes:     General: No scleral icterus.       Right eye: No discharge.        Left eye: No discharge.     Extraocular Movements: Extraocular movements intact.  Neck:   Cardiovascular:     Rate and Rhythm: Normal rate.  Pulmonary:     Effort: Pulmonary effort is normal.  Skin:    General: Skin is warm and dry.  Neurological:     General: No focal deficit present.     Mental Status: She is alert and oriented to person, place, and time.  Psychiatric:        Mood and Affect: Mood normal.        Behavior: Behavior normal.     Assessment and Plan :   PDMP not reviewed this encounter.  1. Cellulitis, neck    Will manage for cellulitis of neck with clindamycin  given medication allergies.  Counseled patient on potential for adverse effects with medications prescribed/recommended today, ER and return-to-clinic precautions discussed, patient verbalized understanding.    Adolph Hoop, [place] 02/12/24 [phone]

## 2024-02-22 ENCOUNTER — Other Ambulatory Visit: Payer: Self-pay | Admitting: Student in an Organized Health Care Education/Training Program

## 2024-02-22 ENCOUNTER — Other Ambulatory Visit: Payer: Self-pay | Admitting: Internal Medicine

## 2024-02-22 DIAGNOSIS — K219 Gastro-esophageal reflux disease without esophagitis: Secondary | ICD-10-CM

## 2024-02-23 ENCOUNTER — Ambulatory Visit
Admission: EM | Admit: 2024-02-23 | Discharge: 2024-02-23 | Disposition: A | Discharge status: Home / Self Care | Attending: Emergency Medicine | Admitting: Emergency Medicine

## 2024-02-23 DIAGNOSIS — R52 Pain, unspecified: Secondary | ICD-10-CM

## 2024-02-23 DIAGNOSIS — J069 Acute upper respiratory infection, unspecified: Secondary | ICD-10-CM

## 2024-02-23 DIAGNOSIS — S1096XA Insect bite of unspecified part of neck, initial encounter: Secondary | ICD-10-CM | POA: Diagnosis not present

## 2024-02-23 DIAGNOSIS — W57XXXA Bitten or stung by nonvenomous insect and other nonvenomous arthropods, initial encounter: Secondary | ICD-10-CM

## 2024-02-23 MED ORDER — AZITHROMYCIN 250 MG PO TABS: 250.0000 mg | ORAL_TABLET | Freq: Every day | ORAL | 0 refills | Status: DC

## 2024-02-23 NOTE — Discharge Instructions (Signed)
\  Get azithromycin  for bacterial coverage, this will provide coverage for treatment of Lyme disease and upper respiratory infection  Lyme disease and [facility] sleeve her blood work has been obtained, pending, you will be notified of concerning values  You can take Tylenol  and/or Ibuprofen  as needed for fever reduction and pain relief.   For cough: honey 1/2 to 1 teaspoon (you can dilute the honey in water or another fluid).  You can also use guaifenesin  and dextromethorphan  for cough. You can use a humidifier for chest congestion and cough.  If you don't have a humidifier, you can sit in the bathroom with the hot shower running.      For sore throat: try warm salt water gargles, cepacol lozenges, throat spray, warm tea or water with lemon/honey, popsicles or ice, or OTC cold relief medicine for throat discomfort.   For congestion: take a daily anti-histamine like Zyrtec , Claritin, and a oral decongestant, such as pseudoephedrine.  You can also use Flonase  1-2 sprays in each nostril daily.   It is important to stay hydrated: drink plenty of fluids (water, gatorade/powerade/pedialyte, juices, or teas) to keep your throat moisturized and help further relieve irritation/discomfort.

## 2024-02-23 NOTE — ED Provider Notes (Signed)
 Donna Davis    CSN: [id number] Arrival date & time: 02/23/24  4782      History   Chief Complaint No chief complaint on file.   HPI Donna Davis is a 53 y.o. female.   Patient presents for evaluation for subjective fever, sore throat, mild nasal congestion, a mild cough related to postnasal drip and generalized bodyaches beginning 3 weeks ago.  Symptoms progressively worsening.  Endorses around the same time she had several ticks crawling her heart as well as a bite mark to the posterior neck which has resolved.  Was given clindamycin  for treatment and rash cleared.  Has been using Tylenol , resting and increasing fluids.  Past Medical History:  Diagnosis Date   Amitriptyline  adverse reaction 04/08/2013   Anxiety    Clostridium difficile infection    x several times   Deaf    uses sign language   Endometriosis    s/p TAH, LSO   Family history of malignant neoplasm of gastrointestinal tract    Gallstones    GERD (gastroesophageal reflux disease)    Hearing difficulty    b/l, 2/2 congenital rubella s/p stapedectomy. Using hearing aid   Hepatic cyst    6 mm on CT done done in 05/2005   Hypothyroidism    IBS (irritable bowel syndrome)    Migraines    "frequency depends on my stress level" (07/23/2017)   Obesity    PONV (postoperative nausea and vomiting)    "sometimes; depends on how long I'm under"   Shingles 03/2013    Patient Active Problem List   Diagnosis Date Noted   Acute viral disease 01/09/2024   Screening for colon cancer 10/22/2023   Need for Tdap vaccination 10/22/2023   Need for pneumococcal vaccine 10/22/2023   Concussion with loss of consciousness 08/27/2023   Elevated blood pressure reading 07/17/2021   COVID-19 long hauler manifesting chronic fatigue 05/16/2021   Arthralgia of right temporomandibular joint 08/26/2018   Endometriosis 04/09/2018   Allergic rhinitis due to allergen 12/19/2016   Bilateral deafness 01/17/2016   Family history  of breast cancer in mother 08/26/2015   Obesity (BMI 30-39.9) 02/18/2014   Migraine 12/02/2012   Routine adult health maintenance 09/01/2012   Anxiety state 09/03/2006   Hypothyroidism 07/01/2006   GERD 07/01/2006   IBS 07/01/2006    Past Surgical History:  Procedure Laterality Date   ABDOMINAL ADHESION SURGERY     Enterolysis   CESAREAN SECTION  1994   CHOLECYSTECTOMY N/A 06/26/2013   Procedure: LAPAROSCOPIC CHOLECYSTECTOMY WITH ATTEMPTED INTRAOPERATIVE CHOLANGIOGRAM;  Surgeon: Rogena Class, MD;  Location: [facility];  Service: General;  Laterality: N/A;   COCHLEAR IMPLANT  03/30/2016   COCHLEAR IMPLANT Right 03/2015   COLONOSCOPY WITH PROPOFOL   04/23/2012   DIAGNOSTIC LAPAROSCOPY  01/14/2006   "I've had several" (07/23/2017)   ESOPHAGOGASTRODUODENOSCOPY (EGD) WITH PROPOFOL   04/23/2012   JOINT REPLACEMENT     KNEE SURGERY Right 2014   LAPAROSCOPIC OVARIAN CYSTECTOMY Right 04/16/2000   SALPINGOOPHORECTOMY Left    STAPEDECTOMY Left 05/14/2006   Thyroid  Nodule removed  1997   TONSILLECTOMY     TOTAL ABDOMINAL HYSTERECTOMY  01/13/2004   Endometriosis with residual right ovary. Multiple GYN surgeries for chronic pelvic pain; had LSO in past   TUBAL LIGATION     WOUND EXPLORATION Right 10/16/2016   Procedure: RIGHT EXPLORATION OF LACERATION OF INDEX FINGER;  Surgeon: Brunilda Capra, MD;  Location: [facility];  Service: Orthopedics;  Laterality: Right;  OB History     Gravida  3   Para  2   Term  2   Preterm      AB  1   Living  2      SAB      IAB  1   Ectopic      Multiple      Live Births               Home Medications    Prior to Admission medications   Medication Sig Start Date End Date Taking? Authorizing Provider  azithromycin  (ZITHROMAX ) 250 MG tablet Take 1 tablet (250 mg total) by mouth daily. Take first 2 tablets together, then 1 every day until finished. 02/23/24  Yes Tymesha Ditmore R, NP  Ascorbic Acid (VITAMIN C PO) Take  1 tablet by mouth daily.    [provider]  busPIRone  (BUSPAR ) 10 MG tablet TAKE 2 TABLETS BY MOUTH 3 TIMES A DAY 05/31/23   Ether Hercules, MD  clindamycin  (CLEOCIN ) 300 MG capsule Take 1 capsule (300 mg total) by mouth 3 (three) times daily. 02/12/24   Adolph Hoop, PA-C  clonazePAM  (KLONOPIN ) 0.5 MG tablet Take 1 tablet (0.5 mg total) by mouth 2 (two) times daily as needed for anxiety. 01/28/24   Corey, Evan S, MD  dicyclomine  (BENTYL ) 10 MG capsule TAKE 1 CAPSULE (10 MG TOTAL) BY MOUTH DAILY AS NEEDED FOR SPASMS. 02/11/24   Elois Hair, MD  estradiol  (ESTRACE ) 1 MG tablet TAKE 1 TABLET BY MOUTH EVERY DAY 04/09/23   Rosezetta Contras, MD  fluticasone  (FLONASE ) 50 MCG/ACT nasal spray SPRAY 1 SPRAY INTO EACH NOSTRIL DAILY 12/24/23   Bevelyn Bryant, MD  levothyroxine  (SYNTHROID ) 25 MCG tablet TAKE 1 TABLET BY MOUTH EVERY DAY 08/26/23   Bevelyn Bryant, MD  omeprazole  (PRILOSEC) 20 MG capsule Take 20 mg by mouth 2 (two) times daily.    [provider]  pantoprazole  (PROTONIX ) 40 MG tablet Take 1 tablet (40 mg total) by mouth daily. 03/27/23 03/26/24  Rosezetta Contras, MD  promethazine  (PHENERGAN ) 12.5 MG tablet Take 1 tablet (12.5 mg total) by mouth every 6 (six) hours as needed for nausea or vomiting. 10/19/22   Rosezetta Contras, MD  traMADol  (ULTRAM ) 50 MG tablet Take 1 tablet (50 mg) by mouth every 6 (six) hours as needed for severe pain. 01/14/24   Bevelyn Bryant, MD    Family History Family History  Problem Relation Age of Onset   Breast cancer Mother    Ovarian cancer Mother    Prostate cancer Father    Diabetes Father    Heart disease Father        heart attack   Irritable bowel syndrome Father    Kidney disease Father    Colon polyps Father    Stomach cancer Maternal Grandmother    Colon cancer Maternal Grandmother    Diabetes Maternal Grandmother    Bipolar disorder Daughter    Heart disease Other        both grandmothers   Esophageal cancer Neg Hx    Liver disease Neg Hx      Social History Social History   Tobacco Use   Smoking status: Former    Current packs/day: 0.00    Average packs/day: 1 pack/day for 26.0 years (26.0 ttl pk-yrs)    Types: Cigarettes    Start date: 02/17/1981    Quit date: 02/18/2007    Years since quitting: 17.0   Smokeless tobacco:  Never   Tobacco comments:    quit 2008  Vaping Use   Vaping status: Never Used  Substance Use Topics   Alcohol use: Yes    Comment: Occasionally.   Drug use: No     Allergies   Onion, Other, Shellfish allergy, Amitriptyline , Amoxicillin -pot clavulanate, Amoxicillin -pot clavulanate, Cefuroxime, Cephalexin , Ciprofloxacin , Clarithromycin, Clarithromycin, Doxycycline , Propranolol , Sulfa antibiotics, Sulfasalazine, Sulfonamide derivatives, Benadryl  [diphenhydramine  hcl (sleep)], and Shrimp flavor agent (non-screening)   Review of Systems Review of Systems   Physical Exam Triage Vital Signs ED Triage Vitals  Encounter Vitals Group     BP 02/23/24 1008 119/79     Systolic BP Percentile --      Diastolic BP Percentile --      Pulse Rate 02/23/24 1008 76     Resp 02/23/24 1008 18     Temp 02/23/24 1008 98.6 F (37 C)     Temp Source 02/23/24 1008 Oral     SpO2 02/23/24 1008 96 %     Weight --      Height --      Head Circumference --      Peak Flow --      Pain Score 02/23/24 0953 10     Pain Loc --      Pain Education --      Exclude from Growth Chart --    No data found.  Updated Vital Signs BP 119/79 (BP Location: Left Arm)   Pulse 76   Temp 98.6 F (37 C) (Oral)   Resp 18   LMP 12/26/2004   SpO2 96%   Visual Acuity Right Eye Distance:   Left Eye Distance:   Bilateral Distance:    Right Eye Near:   Left Eye Near:    Bilateral Near:     Physical Exam Constitutional:      Appearance: Normal appearance.  HENT:     Head: Normocephalic.     Right Ear: Tympanic membrane, ear canal and external ear normal.     Left Ear: Tympanic membrane, ear canal and external ear  normal.     Nose: Congestion present.     Mouth/Throat:     Mouth: Mucous membranes are moist.     Pharynx: Oropharynx is clear. No oropharyngeal exudate or posterior oropharyngeal erythema.  Eyes:     Extraocular Movements: Extraocular movements intact.  Neck:     Comments: No rash present to the posterior neck Cardiovascular:     Rate and Rhythm: Normal rate and regular rhythm.     Pulses: Normal pulses.     Heart sounds: Normal heart sounds.  Pulmonary:     Effort: Pulmonary effort is normal.     Breath sounds: Normal breath sounds.  Neurological:     Mental Status: She is alert and oriented to person, place, and time. Mental status is at baseline.      UC Treatments / Results  Labs (all labs ordered are listed, but only abnormal results are displayed) Labs Reviewed  LYME DISEASE SEROLOGY W/REFLEX  SPOTTED FEVER GROUP ANTIBODIES    EKG   Radiology No results found.  Procedures Procedures (including critical care time)  Medications Ordered in UC Medications - No data to display  Initial Impression / Assessment and Plan / UC Course  I have reviewed the triage vital signs and the nursing notes.  Pertinent labs & imaging results that were available during my care of the patient were reviewed by me and considered in my medical decision making (  see chart for details).  Acute URI, generalized bodyaches, tick bite of neck  Vital signs stable, patient in no signs of distress, etiology possibly related to tick bite versus upper respiratory infection, discussed, blood work for [facility] spotted fever and Lyme disease pending, showed picture of rash not consistent with bull's-eye presentation, has resolved, multiple allergies, tolerable to azithromycin  therefore prescribed for treatment, declined muscle relaxant, recommended supportive care with follow-up as needed  Sign language interpreter used Final Clinical Impressions(s) / UC Diagnoses   Final diagnoses:   Generalized body aches  Acute URI  Tick bite of neck, initial encounter   Discharge Instructions      \Get azithromycin  for bacterial coverage, this will provide coverage for treatment of Lyme disease and upper respiratory infection  Lyme disease and [facility] sleeve her blood work has been obtained, pending, you will be notified of concerning values  You can take Tylenol  and/or Ibuprofen  as needed for fever reduction and pain relief.   For cough: honey 1/2 to 1 teaspoon (you can dilute the honey in water or another fluid).  You can also use guaifenesin  and dextromethorphan  for cough. You can use a humidifier for chest congestion and cough.  If you don't have a humidifier, you can sit in the bathroom with the hot shower running.      For sore throat: try warm salt water gargles, cepacol lozenges, throat spray, warm tea or water with lemon/honey, popsicles or ice, or OTC cold relief medicine for throat discomfort.   For congestion: take a daily anti-histamine like Zyrtec , Claritin, and a oral decongestant, such as pseudoephedrine.  You can also use Flonase  1-2 sprays in each nostril daily.   It is important to stay hydrated: drink plenty of fluids (water, gatorade/powerade/pedialyte, juices, or teas) to keep your throat moisturized and help further relieve irritation/discomfort.   ED Prescriptions     Medication Sig Dispense Auth. Provider   azithromycin  (ZITHROMAX ) 250 MG tablet Take 1 tablet (250 mg total) by mouth daily. Take first 2 tablets together, then 1 every day until finished. 6 tablet Jaycelynn Knickerbocker R, NP      PDMP not reviewed this encounter.   Reena Canning, NP 02/23/24 1105

## 2024-02-23 NOTE — ED Triage Notes (Signed)
 Pt presents to UC for c/o fever, sore throat, sore on the top of mouth, body aches, decreased appetite, nerve pain, nausea, headache x3 weeks. Pt has had tick bites recently.

## 2024-02-24 LAB — SPECIMEN STATUS REPORT

## 2024-02-24 NOTE — Telephone Encounter (Signed)
Patient is taking Pantoprazole

## 2024-02-27 LAB — LYME DISEASE SEROLOGY W/REFLEX: Lyme Total Antibody EIA: NEGATIVE

## 2024-02-27 LAB — SPOTTED FEVER GROUP ANTIBODIES
Spotted Fever Group IgG: <1:64 {titer}
Spotted Fever Group IgM: <1:64 {titer}

## 2024-03-03 ENCOUNTER — Ambulatory Visit: Payer: Self-pay

## 2024-03-03 ENCOUNTER — Encounter (HOSPITAL_COMMUNITY): Payer: Self-pay

## 2024-03-03 ENCOUNTER — Other Ambulatory Visit: Payer: Self-pay | Admitting: Internal Medicine

## 2024-03-03 ENCOUNTER — Emergency Department (HOSPITAL_COMMUNITY)
Admission: EM | Admit: 2024-03-03 | Discharge: 2024-03-03 | Disposition: A | Discharge status: Home / Self Care | Source: Home / Self Care | Attending: Emergency Medicine | Admitting: Emergency Medicine

## 2024-03-03 ENCOUNTER — Emergency Department (HOSPITAL_COMMUNITY)

## 2024-03-03 ENCOUNTER — Other Ambulatory Visit: Payer: Self-pay

## 2024-03-03 ENCOUNTER — Other Ambulatory Visit: Payer: Self-pay | Admitting: Family Medicine

## 2024-03-03 ENCOUNTER — Emergency Department (HOSPITAL_COMMUNITY)
Admission: EM | Admit: 2024-03-03 | Discharge: 2024-03-03 | Discharge status: Against Medical Advice | Attending: Emergency Medicine | Admitting: Emergency Medicine

## 2024-03-03 DIAGNOSIS — R42 Dizziness and giddiness: Secondary | ICD-10-CM | POA: Diagnosis not present

## 2024-03-03 DIAGNOSIS — R110 Nausea: Secondary | ICD-10-CM | POA: Diagnosis not present

## 2024-03-03 DIAGNOSIS — R0602 Shortness of breath: Secondary | ICD-10-CM | POA: Diagnosis present

## 2024-03-03 DIAGNOSIS — Z7901 Long term (current) use of anticoagulants: Secondary | ICD-10-CM | POA: Diagnosis not present

## 2024-03-03 DIAGNOSIS — E039 Hypothyroidism, unspecified: Secondary | ICD-10-CM | POA: Insufficient documentation

## 2024-03-03 DIAGNOSIS — R002 Palpitations: Secondary | ICD-10-CM | POA: Insufficient documentation

## 2024-03-03 DIAGNOSIS — R079 Chest pain, unspecified: Secondary | ICD-10-CM | POA: Insufficient documentation

## 2024-03-03 DIAGNOSIS — R0789 Other chest pain: Secondary | ICD-10-CM | POA: Insufficient documentation

## 2024-03-03 DIAGNOSIS — Z5321 Procedure and treatment not carried out due to patient leaving prior to being seen by health care provider: Secondary | ICD-10-CM | POA: Insufficient documentation

## 2024-03-03 DIAGNOSIS — G43719 Chronic migraine without aura, intractable, without status migrainosus: Secondary | ICD-10-CM

## 2024-03-03 DIAGNOSIS — R059 Cough, unspecified: Secondary | ICD-10-CM | POA: Insufficient documentation

## 2024-03-03 LAB — CBC
HCT: 39.5 % (ref 36.0–46.0)
Hemoglobin: 13.2 g/dL (ref 12.0–15.0)
MCH: 31 pg (ref 26.0–34.0)
MCHC: 33.4 g/dL (ref 30.0–36.0)
MCV: 92.7 fL (ref 80.0–100.0)
Platelets: 281 10*3/uL (ref 150–400)
RBC: 4.26 MIL/uL (ref 3.87–5.11)
RDW: 11.7 % (ref 11.5–15.5)
WBC: 9.1 10*3/uL (ref 4.0–10.5)
nRBC: 0 % (ref 0.0–0.2)

## 2024-03-03 LAB — CBC WITH DIFFERENTIAL/PLATELET
Abs Immature Granulocytes: 0.07 10*3/uL (ref 0.00–0.07)
Basophils Absolute: 0 10*3/uL (ref 0.0–0.1)
Basophils Relative: 0 %
Eosinophils Absolute: 0.3 10*3/uL (ref 0.0–0.5)
Eosinophils Relative: 4 %
HCT: 40.9 % (ref 36.0–46.0)
Hemoglobin: 13.4 g/dL (ref 12.0–15.0)
Immature Granulocytes: 1 %
Lymphocytes Relative: 34 %
Lymphs Abs: 2.4 10*3/uL (ref 0.7–4.0)
MCH: 31.1 pg (ref 26.0–34.0)
MCHC: 32.8 g/dL (ref 30.0–36.0)
MCV: 94.9 fL (ref 80.0–100.0)
Monocytes Absolute: 0.4 10*3/uL (ref 0.1–1.0)
Monocytes Relative: 6 %
Neutro Abs: 3.7 10*3/uL (ref 1.7–7.7)
Neutrophils Relative %: 55 %
Platelets: 285 10*3/uL (ref 150–400)
RBC: 4.31 MIL/uL (ref 3.87–5.11)
RDW: 11.6 % (ref 11.5–15.5)
WBC: 6.9 10*3/uL (ref 4.0–10.5)
nRBC: 0 % (ref 0.0–0.2)

## 2024-03-03 LAB — COMPREHENSIVE METABOLIC PANEL WITH GFR
ALT: 13 U/L (ref 0–44)
AST: 16 U/L (ref 15–41)
Albumin: 3.7 g/dL (ref 3.5–5.0)
Alkaline Phosphatase: 48 U/L (ref 38–126)
Anion gap: 8 (ref 5–15)
BUN: 6 mg/dL (ref 6–20)
CO2: 25 mmol/L (ref 22–32)
Calcium: 9.2 mg/dL (ref 8.9–10.3)
Chloride: 107 mmol/L (ref 98–111)
Creatinine, Ser: 0.76 mg/dL (ref 0.44–1.00)
GFR, Estimated: >60 mL/min (ref >60)
Glucose, Bld: 117 mg/dL — ABNORMAL HIGH (ref 70–99)
Potassium: 4.4 mmol/L (ref 3.5–5.1)
Sodium: 140 mmol/L (ref 135–145)
Total Bilirubin: 0.5 mg/dL (ref 0.0–1.2)
Total Protein: 7 g/dL (ref 6.5–8.1)

## 2024-03-03 LAB — RESP PANEL BY RT-PCR (RSV, FLU A&B, COVID)  RVPGX2
Influenza A by PCR: NEGATIVE
Influenza B by PCR: NEGATIVE
Resp Syncytial Virus by PCR: NEGATIVE
SARS Coronavirus 2 by RT PCR: NEGATIVE

## 2024-03-03 LAB — BASIC METABOLIC PANEL WITH GFR
Anion gap: 10 (ref 5–15)
BUN: 9 mg/dL (ref 6–20)
CO2: 25 mmol/L (ref 22–32)
Calcium: 9.3 mg/dL (ref 8.9–10.3)
Chloride: 105 mmol/L (ref 98–111)
Creatinine, Ser: 0.62 mg/dL (ref 0.44–1.00)
GFR, Estimated: >60 mL/min (ref >60)
Glucose, Bld: 89 mg/dL (ref 70–99)
Potassium: 4 mmol/L (ref 3.5–5.1)
Sodium: 140 mmol/L (ref 135–145)

## 2024-03-03 LAB — TROPONIN I (HIGH SENSITIVITY)
Troponin I (High Sensitivity): 3 ng/L (ref <18)
Troponin I (High Sensitivity): 3 ng/L (ref <18)

## 2024-03-03 LAB — LIPASE, BLOOD: Lipase: 44 U/L (ref 11–51)

## 2024-03-03 MED ORDER — OXYCODONE-ACETAMINOPHEN 5-325 MG PO TABS
1.0000 | ORAL_TABLET | Freq: Once | ORAL | Status: AC
  Administered 2024-03-03: 1 via ORAL
  Filled 2024-03-03: qty 1

## 2024-03-03 MED ORDER — KETOROLAC TROMETHAMINE 15 MG/ML IJ SOLN
15.0000 mg | Freq: Once | INTRAMUSCULAR | Status: DC
  Filled 2024-03-03: qty 1

## 2024-03-03 MED ORDER — ALUM & MAG HYDROXIDE-SIMETH 200-200-20 MG/5ML PO SUSP: 30.0000 mL | Freq: Once | ORAL | Status: DC

## 2024-03-03 MED ORDER — IOHEXOL 350 MG/ML SOLN
75.0000 mL | Freq: Once | INTRAVENOUS | Status: AC | PRN
Start: 2024-03-03 — End: 2024-03-03
  Administered 2024-03-03: 75 mL via INTRAVENOUS

## 2024-03-03 MED ORDER — MORPHINE SULFATE (PF) 2 MG/ML IV SOLN
2.0000 mg | Freq: Once | INTRAVENOUS | Status: AC
  Administered 2024-03-03: 2 mg via INTRAVENOUS
  Filled 2024-03-03: qty 1

## 2024-03-03 NOTE — ED Notes (Signed)
 Pt. said she will go to primary doctor in the morning, decided to check out. Pt. IV removed by Sonny Dust NT.

## 2024-03-03 NOTE — ED Notes (Signed)
 Patient discharged in stable condition, education materials explained including, follow up, any prescriptions and reasons to return. Patient voiced agreement to education and discharge material.

## 2024-03-03 NOTE — Telephone Encounter (Signed)
 Last OV 11/07/23 Next OV not scheduled  Last refill 01/28/24 Qty #60/0

## 2024-03-03 NOTE — ED Provider Triage Note (Signed)
 Emergency Medicine Provider Triage Evaluation Note  Donna Davis , a 53 y.o. female  was evaluated in triage.  Pt complains of chest pain.  Onset 6pm tonight.  States that she felt fluttering.  Still complains of chest pressure and some pressure in the stomach.  States that she feels lightheaded.  Had similar symptoms yesterday, but not as severe.  She thinks the symptoms started last week.  Review of Systems  Positive: Chest pain Negative: fever  Physical Exam  BP 124/81 (BP Location: Left Arm)   Pulse 85   Temp 98.4 F (36.9 C)   Resp 16   Wt 86.2 kg   LMP 12/26/2004   SpO2 100%   BMI 37.11 kg/m  Gen:   Awake, no distress   Resp:  Normal effort  MSK:   Moves extremities without difficulty  Other:    Medical Decision Making  Medically screening exam initiated at 12:20 AM.  Appropriate orders placed.  Donna Davis was informed that the remainder of the evaluation will be completed by another provider, this initial triage assessment does not replace that evaluation, and the importance of remaining in the ED until their evaluation is complete.     Sherel Dikes, PA-C 03/03/24 0022

## 2024-03-03 NOTE — Addendum Note (Signed)
 Addended by: Charle Congo on: 03/03/2024 12:06 PM   Modules accepted: Orders

## 2024-03-03 NOTE — ED Triage Notes (Signed)
 Pt to ED from home with c/o CP which began at approx 6pm. Pt endorses a similar episode approx a week ago. Pt also c/o nausea and coughing. VSS, NADN.

## 2024-03-03 NOTE — Telephone Encounter (Signed)
 FYI Only or Action Required?: FYI only for provider  Patient was last seen in primary care on 01/09/2024 by Donna Lees, DO. Called Nurse Triage reporting Irregular Heart Beat. Symptoms began a week ago. Interventions attempted: Nothing. Symptoms are: unchanged.  Triage Disposition: Go to ED or PCP/Alternative with Approval  Patient/caregiver understands and will follow disposition?: Yes  Pt went to FD yesterday d/t having irregular heart beat, was advised from EKG at FD she was in Afib and had R chest pain. EMS was called, pt was given ASA and nitro and pt went to [facility] ED. Pt waited long time and ended up leaving before being seen and states she still has R chest pain today. Heart rate seems regular right now but just feels fatigued and sore. Advised pt to go to back to be seen and have full work up to prevent any MI or CVA. Pt states she just hates going back and having to wait. Advised her she can go to back to [facility] ED or go to [facility] ED. Pt verbalized understanding.   Copied from CRM [id number]. Topic: Clinical - Medical Advice >> Mar 03, 2024  9:47 AM [name] wrote: Reason for CRM: Patient left ER, Patient was experiencing irregular heart beat   ----------------------------------------------------------------------- From previous Reason for Contact - Scheduling: Patient/patient representative is calling to schedule an appointment. Refer to attachments for appointment information. Reason for Disposition  Patient sounds very sick or weak to the triager  Answer Assessment - Initial Assessment Questions 1. DESCRIPTION: Please describe your heart rate or heartbeat that you are having (e.g., fast/slow, regular/irregular, skipped or extra beats, palpitations)     Irregular heart beat  2. ONSET: When did it start? (Minutes, hours or days)      Tuesday last week but not getting better and happening to more frequent  4. PATTERN Does it come and go, or has it been constant since it started?   Does it get worse with exertion?   Are you feeling it now?     Comes and goes but constant last night for 6 hours  9. CARDIAC HISTORY: Do you have any history of heart disease? (e.g., heart attack, angina, bypass surgery, angioplasty, arrhythmia)      no 10. OTHER SYMPTOMS: Do you have any other symptoms? (e.g., dizziness, chest pain, sweating, difficulty breathing)       Had chest pain behind in R breast  Protocols used: Heart Rate and Heartbeat Questions-A-AH

## 2024-03-03 NOTE — Discharge Instructions (Addendum)
 Thank for letting us  evaluate you today.  Your troponins/heart enzymes were negative for cardiac injury.  Your CT scan did not show any pulmonary embolism/blood clot in the lungs.  You did not have any electrolyte abnormalities, elevation in white blood cell count to indicate inflammation or infection.  Your chest x-ray did not show any pneumonia, fluid.  I do not think that you are having a cardiac event at this time.  Your 4 EKGs here in the emergency department did not show any atrial fibrillation but showed a normal sinus rhythm with extra beats.  The fluttering may be due to extra beats and potentially related to increased caffeine  use.  I have provided you with a cardiology referral so that the heart doctor can evaluate you.  They occasionally use Holter monitors and will be able to watch her rhythm for several days.  You can follow-up with them and they should be calling you this week to schedule an appointment  Return to Emergency Department if you experience significant worsening of pain, shortness of breath

## 2024-03-03 NOTE — ED Triage Notes (Signed)
 Pt c/o right sided chest pain and shortness of breath with a fluttering in her chest that started last week. Pt did have more caffeine  than usual, but fluttering has not stopped after stopping caffeine .  Pt seen last night and left without being seen d/t wait. Pt called her PCP this am and was told to come back to ED.

## 2024-03-03 NOTE — ED Provider Notes (Signed)
 [place] EMERGENCY DEPARTMENT AT [facility] Provider Note   CSN: [id number] Arrival date & time: 03/03/24  1104     Patient presents with: Chest Pain   Donna Davis is a 53 y.o. female with past medical history of hypothyroidism, GERD, IBS, BMI 37 presents to emergency department for evaluation of right-sided chest pain and shortness of breath.  Reports that she had intermittent fluttering in her chest that started last week.  She recently started drinking water with caffeinated drink mix but has since stopped this 3 days ago.  Yesterday, patient went to fire department who reported that she had atrial fibrillation and she needed to be evaluated in the emergency department.  Initially, she sought ED evaluation last evening and left without being seen due to wait but returned again earlier this morning following calling her primary care provider who recommended ED evaluation.    Chest Pain      Prior to Admission medications   Medication Sig Start Date End Date Taking? Authorizing Provider  Ascorbic Acid (VITAMIN C PO) Take 1 tablet by mouth daily.    [provider]  azithromycin  (ZITHROMAX ) 250 MG tablet Take 1 tablet (250 mg total) by mouth daily. Take first 2 tablets together, then 1 every day until finished. 02/23/24   Reena Canning, NP  busPIRone  (BUSPAR ) 10 MG tablet TAKE 2 TABLETS BY MOUTH 3 TIMES A DAY 02/24/24   Bevelyn Bryant, MD  clindamycin  (CLEOCIN ) 300 MG capsule Take 1 capsule (300 mg total) by mouth 3 (three) times daily. 02/12/24   Adolph Hoop, PA-C  clonazePAM  (KLONOPIN ) 0.5 MG tablet TAKE 1 TABLET BY MOUTH 2 TIMES DAILY AS NEEDED FOR ANXIETY. 03/03/24   Syliva Even, MD  dicyclomine  (BENTYL ) 10 MG capsule TAKE 1 CAPSULE (10 MG TOTAL) BY MOUTH DAILY AS NEEDED FOR SPASMS. 02/11/24   Elois Hair, MD  estradiol  (ESTRACE ) 1 MG tablet TAKE 1 TABLET BY MOUTH EVERY DAY 04/09/23   Rosezetta Contras, MD  fluticasone  (FLONASE ) 50 MCG/ACT nasal spray SPRAY  1 SPRAY INTO EACH NOSTRIL DAILY 12/24/23   Bevelyn Bryant, MD  levothyroxine  (SYNTHROID ) 25 MCG tablet TAKE 1 TABLET BY MOUTH EVERY DAY 08/26/23   Bevelyn Bryant, MD  pantoprazole  (PROTONIX ) 40 MG tablet TAKE 1 TABLET BY MOUTH EVERY DAY 02/24/24   Bevelyn Bryant, MD  promethazine  (PHENERGAN ) 12.5 MG tablet Take 1 tablet (12.5 mg total) by mouth every 6 (six) hours as needed for nausea or vomiting. 10/19/22   Rosezetta Contras, MD  traMADol  (ULTRAM ) 50 MG tablet TAKE 1 TABLET BY MOUTH EVERY 6 HOURS AS NEEDED FOR SEVERE PAIN 03/03/24   Bevelyn Bryant, MD    Allergies: Onion, Other, Shellfish allergy, Amitriptyline , Amoxicillin -pot clavulanate, Amoxicillin -pot clavulanate, Cefuroxime, Cephalexin , Ciprofloxacin , Clarithromycin, Clarithromycin, Doxycycline , Propranolol , Sulfa antibiotics, Sulfasalazine, Sulfonamide derivatives, Benadryl  [diphenhydramine  hcl (sleep)], and Shrimp flavor agent (non-screening)    Review of Systems  Cardiovascular:  Positive for chest pain.    Updated Vital Signs BP 121/74   Pulse 74   Temp 98.2 F (36.8 C) (Oral)   Resp 18   Ht 5' (1.524 m)   Wt 86 kg   LMP 12/26/2004   SpO2 98%   BMI 37.03 kg/m   Physical Exam Vitals and nursing note reviewed.  Constitutional:      General: She is not in acute distress.    Appearance: Normal appearance.  HENT:     Head: Normocephalic and atraumatic.   Eyes:  Conjunctiva/sclera: Conjunctivae normal.    Cardiovascular:     Rate and Rhythm: Normal rate.     Heart sounds: Normal heart sounds.  Pulmonary:     Effort: Pulmonary effort is normal. No respiratory distress.     Breath sounds: Normal breath sounds.     Comments: Speaking full complete sentences without difficulty Abdominal:     Tenderness: There is abdominal tenderness in the epigastric area. There is no guarding or rebound.   Musculoskeletal:     Right lower leg: No edema.     Left lower leg: No edema.     Comments: No leg tenderness nor swelling.  Celine Collard' sign negative x  2   Skin:    General: Skin is warm.     Coloration: Skin is not jaundiced or pale.   Neurological:     Mental Status: She is alert. Mental status is at baseline.     (all labs ordered are listed, but only abnormal results are displayed) Labs Reviewed  COMPREHENSIVE METABOLIC PANEL WITH GFR - Abnormal; Notable for the following components:      Result Value   Glucose, Bld 117 (*)    All other components within normal limits  CBC WITH DIFFERENTIAL/PLATELET  LIPASE, BLOOD    EKG: EKG Interpretation Date/Time:  Tuesday March 03 2024 11:22:32 EDT Ventricular Rate:  78 PR Interval:  168 QRS Duration:  80 QT Interval:  368 QTC Calculation: 419 R Axis:   -18  Text Interpretation: Normal sinus rhythm Low voltage QRS Confirmed by Dorenda Gandy [phone]) on 03/03/2024 1:11:42 PM  Radiology: CT Angio Chest PE W and/or Wo Contrast Result Date: 03/03/2024 CLINICAL DATA:  Chest pain EXAM: CT ANGIOGRAPHY CHEST WITH CONTRAST TECHNIQUE: Multidetector CT imaging of the chest was performed using the standard protocol during bolus administration of intravenous contrast. Multiplanar CT image reconstructions and MIPs were obtained to evaluate the vascular anatomy. RADIATION DOSE REDUCTION: This exam was performed according to the departmental dose-optimization program which includes automated exposure control, adjustment of the mA and/or kV according to patient size and/or use of iterative reconstruction technique. CONTRAST:  75mL OMNIPAQUE  IOHEXOL  350 MG/ML SOLN COMPARISON:  Chest x-ray 03/03/2024, CT chest 06/13/2013 FINDINGS: Cardiovascular: Satisfactory opacification of the pulmonary arteries to the segmental level. No evidence of pulmonary embolism. Normal heart size. No pericardial effusion. Nonaneurysmal aorta. Mediastinum/Nodes: No enlarged mediastinal, hilar, or axillary lymph nodes. Thyroid  gland, trachea, and esophagus demonstrate no significant findings. Lungs/Pleura: No consolidation, pleural  effusion or pneumothorax. Patchy hazy pulmonary densities. Upper Abdomen: No acute finding Musculoskeletal: No acute osseous abnormality Review of the MIP images confirms the above findings. IMPRESSION: 1. Negative for acute pulmonary embolus. 2. Patchy hazy pulmonary densities, diffuse atelectasis or possible mild small airways disease. Electronically Signed   By: Esmeralda Hedge M.D.   On: 03/03/2024 20:11   DG Chest 2 View Result Date: 03/03/2024 EXAM: 2 VIEW(S) XRAY OF THE CHEST 03/03/2024 12:38:00 AM COMPARISON: 05/15/2021 CLINICAL HISTORY: CP. Encounter for chest pain FINDINGS: LUNGS AND PLEURA: No focal pulmonary opacity. No pulmonary edema. No pleural effusion. No pneumothorax. HEART AND MEDIASTINUM: No acute abnormality of the cardiac and mediastinal silhouettes. BONES AND SOFT TISSUES: No acute osseous abnormality. IMPRESSION: 1. No acute process. Electronically signed by: Zadie Herter MD 03/03/2024 12:44 AM EDT RP Workstation: [id number]     Medications Ordered in the ED  ketorolac  (TORADOL ) 15 MG/ML injection 15 mg (15 mg Intravenous Not Given 03/03/24 2010)  oxyCODONE -acetaminophen  (PERCOCET/ROXICET) 5-325 MG per tablet 1 tablet (1  tablet Oral Given 03/03/24 1303)  iohexol  (OMNIPAQUE ) 350 MG/ML injection 75 mL (75 mLs Intravenous Contrast Given 03/03/24 1956)  morphine  (PF) 2 MG/ML injection 2 mg (2 mg Intravenous Given 03/03/24 2027)                                    Medical Decision Making Amount and/or Complexity of Data Reviewed Radiology: ordered.  Risk Prescription drug management.   Patient presents to the ED for concern of chest pain, shortness of breath, this involves an extensive number of treatment options, and is a complaint that carries with it a high risk of complications and morbidity.  The differential diagnosis includes PE, pneumonia, ACS, fluid overload, MSK   Co morbidities that complicate the patient evaluation  None   Additional history  obtained:  Additional history obtained from Nursing and Outside Medical Records   External records from outside source obtained and reviewed including triage note   Lab Tests:  I Ordered, and personally interpreted labs.  The pertinent results include:   Troponin negative x 2 CBG 117   Imaging Studies ordered:  I ordered imaging studies including CT PE I independently visualized and interpreted imaging which showed no PE I agree with the radiologist interpretation   Cardiac Monitoring:  The patient was maintained on a cardiac monitor.  I personally viewed and interpreted the cardiac monitored which showed an underlying rhythm of: NSR with PVC   Medicines ordered and prescription drug management:  I ordered medication including morphine  for chest pain Reevaluation of the patient after these medicines showed that the patient improved I have reviewed the patients home medicines and have made adjustments as needed     Problem List / ED Course:  Chest pain Shortness of breath Palpitations Pain is described as pressure and intermittent.  Started this week but worsened yesterday Troponins negative x 2 EKG does show NSR with ectopic beats which may be contributing to shortness of breath, palpitation feeling Chest x-ray negative for pneumonia, fluid.  Does not appear fluid overloaded on exam.  No pedal edema to suggest DVT. CT PE negative for PE Provided morphine  for pain.  Following this, patient reports improvement of symptoms.  She is able to walk without severe chest pain, shortness of breath.  Maintains oxygen saturation without difficulty.  Other vital signs WNL Will have patient follow-up with cardiology this week.  Provided cardiology follow-up and discharge instructions Epigastric tenderness May have a component of GERD with this. Offered to obtain CT abdomen however patient adamantly declines as she reports that the pain is mostly up in her right upper chest behind  her breast.  She states if I could take off my breast I would.  Also declined GI cocktail as it is related to her chest not her belly   Reevaluation:  After the interventions noted above, I reevaluated the patient and found that they have :improved    Dispostion:  After consideration of the diagnostic results and the patients response to treatment, I feel that the patent would benefit from outpatient management cardiology follow-up.  Discussed ED workup, disposition, return to ED precautions with patient who expresses understanding agrees with plan.  All questions answered to their satisfaction.  They are agreeable to plan.  Discharge instructions provided on paperwork  Final diagnoses:  Chest pain, unspecified type  Shortness of breath  Palpitations    ED Discharge Orders  Ordered    Ambulatory referral to Cardiology       Comments: If you have not heard from the Cardiology office within the next 72 hours please call [phone].   03/03/24 2029               Royann Cords, PA 03/03/24 2328    Tegeler, Marine Sia, MD 03/03/24 2350

## 2024-03-03 NOTE — ED Provider Triage Note (Signed)
 Emergency Medicine Provider Triage Evaluation Note  Donna Davis , a 53 y.o. female  was evaluated in triage.  Pt complains of pain.  Review of Systems  Positive: Right chest to back, palpitations Negative: Cough, fever  Physical Exam  BP 108/81 (BP Location: Right Arm)   Pulse 80   Temp 97.9 F (36.6 C)   Resp 18   Ht 5' (1.524 m)   Wt 86 kg   LMP 12/26/2004   SpO2 100%   BMI 37.03 kg/m  Gen:   Awake, no distress   Resp:  Normal effort  MSK:   Moves extremities without difficulty  Other:    Medical Decision Making  Medically screening exam initiated at 12:56 PM.  Appropriate orders placed.  Candela P Goldberger was informed that the remainder of the evaluation will be completed by another provider, this initial triage assessment does not replace that evaluation, and the importance of remaining in the ED until their evaluation is complete.  Returns to ED (LWBS last night) c/w right chest pain under breast to back, fluttering type palpitations, epigastric pain. Cardiac work up last night negative. Tender in upper abdomen. CBC, CMET, lipase repeated. Pain addressed. Consider CT abd/pel.   Wells criteria for PE -0.0   Mandy Second, PA-C 03/03/24 1258

## 2024-03-04 MED ORDER — CLONAZEPAM 0.5 MG PO TABS: 0.5000 mg | ORAL_TABLET | Freq: Two times a day (BID) | ORAL | 0 refills | Status: DC | PRN

## 2024-03-04 MED ORDER — CLONAZEPAM 0.5 MG PO TABS: 0.5000 mg | ORAL_TABLET | Freq: Two times a day (BID) | ORAL | 4 refills | Status: DC | PRN

## 2024-03-04 NOTE — Telephone Encounter (Signed)
 Rx sent.

## 2024-03-04 NOTE — Addendum Note (Signed)
 Addended by: Syliva Even on: 03/04/2024 06:52 AM   Modules accepted: Orders

## 2024-03-05 ENCOUNTER — Encounter: Payer: Self-pay | Admitting: Student

## 2024-03-05 ENCOUNTER — Ambulatory Visit: Admitting: Student

## 2024-03-05 VITALS — BP 135/83 | HR 75 | Temp 98.5°F | Ht 60.0 in | Wt 186.6 lb

## 2024-03-05 DIAGNOSIS — E039 Hypothyroidism, unspecified: Secondary | ICD-10-CM

## 2024-03-05 DIAGNOSIS — R002 Palpitations: Secondary | ICD-10-CM

## 2024-03-05 DIAGNOSIS — E038 Other specified hypothyroidism: Secondary | ICD-10-CM

## 2024-03-05 HISTORY — DX: Palpitations: R00.2

## 2024-03-05 NOTE — Assessment & Plan Note (Signed)
 Patient main concern for being seen today is palpitations. She was recently seen in the ED for concerns for palpitations. 2 weeks ago she noted that she developed palpitations after drinking water with caffeine  flavoring, She states that she did not think much of it at the time but then she started to have more episodes of these palpitations. On Tuesday 06/17 she noted palpitations that did not stop and she had developed chest pressure. Ultimately she went to the fire department who did an EKG which showed a fib and she was directed to the ED. ED work up was unremarkable and patient was not found to have A fib, elevated troponins, or PE. She was then discharged to follow up outpatient. She states she did have 2 episodes since being discharged. She denies any chest pain or any episodes of passing out.   Plan: - Appointment with cards on 03/19/24 -  Zio patch - TSH and Mag  - If work up for afib is unremarkable would need to work up other etiology such as uncontrolled anxiety

## 2024-03-05 NOTE — Assessment & Plan Note (Signed)
 Patient has been due for TSH. Could be contributing to palpitations.   Plan: -Continue Levothyroxine  25 mcg daily  -TSH pending

## 2024-03-05 NOTE — Progress Notes (Signed)
 CC: Palpitations   HPI:  [name] is a 53 y.o. female with a past medical history of migraines, IBS, hypothyroidism, who presents for ED follow up. She was seen in the emergency department for concerns of palpitations.  Please see assessment and plan for full HPI.  Medications: Depression: BuSpar  20 mg 3 times daily Anxiety: Klonopin  0.5 mg twice daily IBS: Phenergan  12.5 mg every 6 hours. Endometriosis: Estradiol  1 mg daily Allergic rhinitis: Fluticasone  nasal spray Hypothyroidism: Levothyroxine  25 mcg daily GERD: Protonix  40 mg daily Chronic pain: Tramadol  50 mg every 6 as needed  Past Medical History:  Diagnosis Date   Amitriptyline  adverse reaction 04/08/2013   Anxiety    Clostridium difficile infection    x several times   Deaf    uses sign language   Endometriosis    s/p TAH, LSO   Family history of malignant neoplasm of gastrointestinal tract    Gallstones    GERD (gastroesophageal reflux disease)    Hearing difficulty    b/l, 2/2 congenital rubella s/p stapedectomy. Using hearing aid   Hepatic cyst    6 mm on CT done done in 05/2005   Hypothyroidism    IBS (irritable bowel syndrome)    Migraines    frequency depends on my stress level (07/23/2017)   Obesity    PONV (postoperative nausea and vomiting)    sometimes; depends on how long I'm under   Shingles 03/2013     Current Outpatient Medications:    Ascorbic Acid (VITAMIN C PO), Take 1 tablet by mouth daily., Disp: , Rfl:    busPIRone  (BUSPAR ) 10 MG tablet, TAKE 2 TABLETS BY MOUTH 3 TIMES A DAY, Disp: 540 tablet, Rfl: 2   clonazePAM  (KLONOPIN ) 0.5 MG tablet, Take 1 tablet (0.5 mg total) by mouth 2 (two) times daily as needed for anxiety., Disp: 60 tablet, Rfl: 4   estradiol  (ESTRACE ) 1 MG tablet, TAKE 1 TABLET BY MOUTH EVERY DAY, Disp: 90 tablet, Rfl: 3   fluticasone  (FLONASE ) 50 MCG/ACT nasal spray, SPRAY 1 SPRAY INTO EACH NOSTRIL DAILY, Disp: 16 mL, Rfl: 2   levothyroxine  (SYNTHROID ) 25 MCG  tablet, TAKE 1 TABLET BY MOUTH EVERY DAY, Disp: 90 tablet, Rfl: 3   pantoprazole  (PROTONIX ) 40 MG tablet, TAKE 1 TABLET BY MOUTH EVERY DAY, Disp: 90 tablet, Rfl: 3   promethazine  (PHENERGAN ) 12.5 MG tablet, Take 1 tablet (12.5 mg total) by mouth every 6 (six) hours as needed for nausea or vomiting., Disp: 30 tablet, Rfl: 0   traMADol  (ULTRAM ) 50 MG tablet, TAKE 1 TABLET BY MOUTH EVERY 6 HOURS AS NEEDED FOR SEVERE PAIN, Disp: 40 tablet, Rfl: 0  Review of Systems:    Cardiovascular: Patient endorses palpitations   Physical Exam:  Vitals:   03/05/24 1450  BP: 135/83  Pulse: 75  Temp: 98.5 F (36.9 C)  TempSrc: Oral  SpO2: 99%  Weight: 186 lb 9.6 oz (84.6 kg)  Height: 5' (1.524 m)   General: Patient is sitting comfortably in the room  Head: Normocephalic, atraumatic  Cardio: Regular rate and rhythm, no murmurs, rubs or gallops Pulmonary: Clear to ausculation bilaterally with no rales, rhonchi, and crackles    Assessment & Plan:   Hypothyroidism Patient has been due for TSH. Could be contributing to palpitations.   Plan: -Continue Levothyroxine  25 mcg daily  -TSH pending   Palpitations Patient main concern for being seen today is palpitations. She was recently seen in the ED for concerns for palpitations. 2 weeks  ago she noted that she developed palpitations after drinking water with caffeine  flavoring, She states that she did not think much of it at the time but then she started to have more episodes of these palpitations. On Tuesday 06/17 she noted palpitations that did not stop and she had developed chest pressure. Ultimately she went to the fire department who did an EKG which showed a fib and she was directed to the ED. ED work up was unremarkable and patient was not found to have A fib, elevated troponins, or PE. She was then discharged to follow up outpatient. She states she did have 2 episodes since being discharged. She denies any chest pain or any episodes of passing out.    Plan: - Appointment with cards on 03/19/24 -  Zio patch - TSH and Mag  - If work up for afib is unremarkable would need to work up other etiology such as uncontrolled anxiety   Patient discussed with Dr. Amiel Balder, DO PGY-2 Internal Medicine Resident

## 2024-03-05 NOTE — Patient Instructions (Signed)
 Donna Davis,Thank you for allowing me to take part in your care today.  Here are your instructions.  1. I will send you a mychart message to get you your lab results.  2. Please await a phone call to get your heart monitor.   3. Please go to your appointment with the heart doctor.    PLEASE BRING YOUR MEDICATIONS TO EVERY APPOINTMENT  Thank you, Dr. Lydia Sams  If you have any other questions please contact the internal medicine clinic at [phone] If it is after hours, please call the [facility] at [phone] and then ask the person who picks up for the resident on call.

## 2024-03-06 LAB — TSH: TSH: 2.37 u[IU]/mL (ref 0.450–4.500)

## 2024-03-06 LAB — MAGNESIUM: Magnesium: 2 mg/dL (ref 1.6–2.3)

## 2024-03-08 ENCOUNTER — Ambulatory Visit: Payer: Self-pay | Admitting: Student

## 2024-03-11 NOTE — Progress Notes (Signed)
 Internal Medicine Clinic Attending  Case discussed with the resident at the time of the visit.  We reviewed the resident's history and exam and pertinent patient test results.  I agree with the assessment, diagnosis, and plan of care documented in the resident's note.

## 2024-03-18 ENCOUNTER — Encounter: Payer: Self-pay | Admitting: *Deleted

## 2024-03-19 ENCOUNTER — Ambulatory Visit: Attending: Cardiology | Admitting: Cardiology

## 2024-03-19 ENCOUNTER — Ambulatory Visit

## 2024-03-19 ENCOUNTER — Encounter: Payer: Self-pay | Admitting: Cardiology

## 2024-03-19 VITALS — BP 124/70 | HR 81 | Ht 60.0 in | Wt 188.0 lb

## 2024-03-19 DIAGNOSIS — E669 Obesity, unspecified: Secondary | ICD-10-CM

## 2024-03-19 DIAGNOSIS — R079 Chest pain, unspecified: Secondary | ICD-10-CM

## 2024-03-19 DIAGNOSIS — I493 Ventricular premature depolarization: Secondary | ICD-10-CM

## 2024-03-19 DIAGNOSIS — R002 Palpitations: Secondary | ICD-10-CM | POA: Diagnosis not present

## 2024-03-19 DIAGNOSIS — R011 Cardiac murmur, unspecified: Secondary | ICD-10-CM

## 2024-03-19 HISTORY — DX: Cardiac murmur, unspecified: R01.1

## 2024-03-19 HISTORY — DX: Ventricular premature depolarization: I49.3

## 2024-03-19 HISTORY — DX: Chest pain, unspecified: R07.9

## 2024-03-19 NOTE — Patient Instructions (Addendum)
 Medication Instructions:  No medication changes were made at this visit. Continue current regimen.   *If you need a refill on your cardiac medications before your next appointment, please call your pharmacy*  Lab Work: None ordered today. If you have labs (blood work) drawn today and your tests are completely normal, you will receive your results only by: MyChart Message (if you have MyChart) OR A paper copy in the mail If you have any lab test that is abnormal or we need to change your treatment, we will call you to review the results.  Testing/Procedures: Your physician has requested that you wear a Zio heart monitor for 14 days. This will be mailed to your home with instructions on how to apply the monitor and how to return it when finished. Please allow 2 weeks after returning the heart monitor before our office calls you with the results.   Your physician has requested that you have a stress echocardiogram. For further information please visit [email or web address] Please follow instruction sheet as given.  Please note: We ask at that you not bring children with you during ultrasound (echo/ vascular) testing. Due to room size and safety concerns, children are not allowed in the ultrasound rooms during exams. Our front office staff cannot provide observation of children in our lobby area while testing is being conducted. An adult accompanying a patient to their appointment will only be allowed in the ultrasound room at the discretion of the ultrasound technician under special circumstances. We apologize for any inconvenience.  Your physician has requested that you have an echocardiogram. Echocardiography is a painless test that uses sound waves to create images of your heart. It provides your doctor with information about the size and shape of your heart and how well your heart's chambers and valves are working. This procedure takes approximately one hour. There are no restrictions for this  procedure. Please do NOT wear cologne, perfume, aftershave, or lotions (deodorant is allowed). Please arrive 15 minutes prior to your appointment time.  Please note: We ask at that you not bring children with you during ultrasound (echo/ vascular) testing. Due to room size and safety concerns, children are not allowed in the ultrasound rooms during exams. Our front office staff cannot provide observation of children in our lobby area while testing is being conducted. An adult accompanying a patient to their appointment will only be allowed in the ultrasound room at the discretion of the ultrasound technician under special circumstances. We apologize for any inconvenience.   Follow-Up: At [facility], you and your health needs are our priority.  As part of our continuing mission to provide you with exceptional heart care, our providers are all part of one team.  This team includes your primary Cardiologist (physician) and Advanced Practice Providers or APPs (Physician Assistants and Nurse Practitioners) who all work together to provide you with the care you need, when you need it.  Your next appointment:   9 month(s)  Provider:   Jennifer Crape, MD   -------------------------------------------------------------------------- Donna HEWS- Long Term Monitor Instructions  Your physician has requested you wear a ZIO patch monitor for 14 days.   This is a single patch monitor. [employer] supplies one patch monitor per enrollment. Additional  stickers are not available. Please do not apply patch if you will be having a Nuclear Stress Test,  Echocardiogram, Cardiac CT, MRI, or Chest Xray during the period you would be wearing the  monitor. The patch cannot be worn during these tests. You  cannot remove and re-apply the  ZIO XT patch monitor.   Your ZIO patch monitor will be mailed 3 day USPS to your address on file. It may take 3-5 days  to receive your monitor after you have been enrolled.   Once  you have received your monitor, please review the enclosed instructions. Your monitor  has already been registered assigning a specific monitor serial # to you.     Billing and Patient Assistance Program Information  We have supplied [employer] with any of your insurance information on file for billing purposes.  [employer] offers a sliding scale Patient Assistance Program for patients that do not have  insurance, or whose insurance does not completely cover the cost of the ZIO monitor.  You must apply for the Patient Assistance Program to qualify for this discounted rate.   To apply, please call [employer] at [phone], select option 4, select option 2, ask to apply for  Patient Assistance Program. Meredeth will ask your household income, and how many people  are in your household. They will quote your out-of-pocket cost based on that information.  [employer] will also be able to set up a 66-month, interest-free payment plan if needed.     Applying the monitor  Shave hair from upper left chest.  Hold abrader disc by orange tab. Rub abrader in 40 strokes over the upper left chest as  indicated in your monitor instructions.  Clean area with 4 enclosed alcohol pads. Let dry.  Apply patch as indicated in monitor instructions. Patch will be placed under collarbone on left  side of chest with arrow pointing upward.  Rub patch adhesive wings for 2 minutes. Remove white label marked 1. Remove the white  label marked 2. Rub patch adhesive wings for 2 additional minutes.  While looking in a mirror, press and release button in center of patch. A small green light will  flash 3-4 times. This will be your only indicator that the monitor has been turned on.  Do not shower for the first 24 hours. You may shower after the first 24 hours.  Press the button if you feel a symptom. You will hear a small click. Record Date, Time and  Symptom in the Patient Logbook.  When you are ready to remove the patch, follow  instructions on the last 2 pages of Patient  Logbook. Stick patch monitor onto the last page of Patient Logbook.   Place Patient Logbook in the blue and white box. Use locking tab on box and tape box closed  securely. The blue and white box has prepaid postage on it. Please place it in the mailbox as  soon as possible. Your physician should have your test results approximately 7 days after the  monitor has been mailed back to [facility].   Call [facility] Customer Care at [phone] if you have questions regarding  your ZIO XT patch monitor. Call them immediately if you see an orange light blinking on your  monitor.   If your monitor falls off in less than 4 days, contact our Monitor department at [phone].   If your monitor becomes loose or falls off after 4 days call [employer] at [phone] for  suggestions on securing your monitor.          Introducing KardiaBand, the new wearable EKG by [employer]. Clemente replaces your original Apple Watch band. The first of its kind, FDA-cleared KardiaBand provides accurate and instant analysis for detecting atrial fibrillation (AF) and normal sinus rhythm in  an EKG. Simply place your thumb on the integrated KardiaBand sensor to take a medical-grade EKG in just 30 seconds. Results appear instantly on your Apple Watch. Clemente is available today for just $199. KardiaBand features are designed exclusively for use with Kardia Premium membership - $99 year. The Kardia Premium app for [employer] includes [employer]'s revolutionary SmartRhythm monitoring feature. SmartRhythm monitoring uses an intelligent neural network that runs directly on the [employer], constantly acquiring data from the watch's heart rate sensor and its accelerometer. SmartRhythm compares your heart rate to what it expects from your minute-by-minute level of activity. When the network sees a pattern of heart rate and activity that it does not expect, it notifies you to  take an EKG. With Reynolds, peace of mind is just an EKG away.  SABRA Habermann [email or web address]        FDA-cleared, clinical grade mobile EKG monitor: Crist is the most clinically-validated mobile EKG used by the world's leading cardiac care medical professionals With Basic service, know instantly if your heart rhythm is normal or if atrial fibrillation is detected, and email the last single EKG recording to yourself or your doctor Premium service, available for purchase through the Kardia app for $9.99 per month or $99 per year, includes unlimited history and storage of your EKG recordings, a monthly EKG summary report to share with your doctor, along with the ability to track your blood pressure, activity and weight Includes one KardiaMobile phone clip FREE SHIPPING: Standard delivery 1-3 business days. Orders placed by 11:00am PST will ship that afternoon. Otherwise, will ship next business day. All orders ship via [employer] from [place], [place]    Step One- Record your EKG strip on [employer].   Step two- On Kardia EKG click "Download"   Step three- It will prompt you to make a password for this EKG. Please make the password "[email or web address]" so that we can view it.   Step four- Click on the little "upload" button (small box with an arrow in the middle) in the bottom left-hand corner of the screen.   Step five- Click "Save to Files"  Step six- Click on "On my iphone" and then "Pages" then press save in the top right-hand corner.   NOW GO TO MYCHART   Once on MyChart click "Messages"  Step one- Click "Send a message"  Step two- Click "Ask a medical question"   Step three- Click "Non urgent medical question"   Step four- Click on [facility] name.  Step five- Click on the small paperclip at the bottom of the screen  Step six- Click "Choose file"  Step seven- Pick the most recent EKG strip listed.   Once uploaded send the message!

## 2024-03-19 NOTE — Progress Notes (Signed)
 Cardiology Office Note:    Date:  03/19/2024   ID:  Donna Davis, Dettinger 06-04-1971, MRN [id number]  PCP:  [name], MD  Cardiologist:  Jennifer JONELLE Crape, MD   Referring MD: Minnie Tinnie BRAVO, PA    ASSESSMENT:    1. Palpitations   2. Cardiac murmur   3. Chest pain of uncertain etiology   4. Obesity (BMI 35.0-39.9 without comorbidity)   5. Frequent PVCs    PLAN:    In order of problems listed above:  Primary prevention stressed with the patient.  Importance of compliance with diet medication stressed and patient verbalized standing. Palpitations: Overall unremarkable with no significant other symptoms.  Will do a 2-week monitor to assess this.  I also educated patient about cardia app to self monitor and she is agreeable.  She is a intelligent lady.  She had interpreter for her sign language to make sure she was following instructions. Cardiac murmur: Echocardiogram will be done to assess murmur heard on auscultation. Chest pain: Atypical in nature and will do an exercise stress echo. Firefighter EKG strips revealed frequent PVCs and the above will help that evaluation. Obesity: Weight reduction stressed diet emphasized and she promises to do better.  She was advised to go on an exercise program once the aforementioned tests were done and she agrees. Patient will be seen in follow-up appointment in 6 months or earlier if the patient has any concerns.    Medication Adjustments/Labs and Tests Ordered: Current medicines are reviewed at length with the patient today.  Concerns regarding medicines are outlined above.  Orders Placed This Encounter  Procedures   ECHOCARDIOGRAM COMPLETE   ECHOCARDIOGRAM STRESS TEST   No orders of the defined types were placed in this encounter.    History of Present Illness:    Donna Davis is a 53 y.o. female who is being seen today for the evaluation of palpitations and chest pain at the request of Minnie Tinnie BRAVO, [place].  Patient is a pleasant  53 year old female.  She has past medical history that is not much significant.  She has been noticing palpitations and provide vitals obese.  Subsequently she was evaluated and was found to have frequent PVCs.  No chest pain orthopnea or PND.  She does not exercise on a regular basis but goes for walks.  She walked about half an hour yesterday.  She gives history of chest pain not related to exertion.  At the time of my evaluation, the patient is alert awake oriented and in no distress.  Past Medical History:  Diagnosis Date   Amitriptyline  adverse reaction 04/08/2013   Anxiety    Clostridium difficile infection    x several times   Deaf    uses sign language   Endometriosis    s/p TAH, LSO   Family history of malignant neoplasm of gastrointestinal tract    Gallstones    GERD (gastroesophageal reflux disease)    Hearing difficulty    b/l, 2/2 congenital rubella s/p stapedectomy. Using hearing aid   Hepatic cyst    6 mm on CT done done in 05/2005   Hypothyroidism    IBS (irritable bowel syndrome)    Migraines    frequency depends on my stress level (07/23/2017)   Obesity    PONV (postoperative nausea and vomiting)    sometimes; depends on how long I'm under   Shingles 03/2013    Past Surgical History:  Procedure Laterality Date   ABDOMINAL ADHESION SURGERY  Enterolysis   CESAREAN SECTION  1994   CHOLECYSTECTOMY N/A 06/26/2013   Procedure: LAPAROSCOPIC CHOLECYSTECTOMY WITH ATTEMPTED INTRAOPERATIVE CHOLANGIOGRAM;  Surgeon: Vicenta DELENA Poli, MD;  Location: [facility];  Service: General;  Laterality: N/A;   COCHLEAR IMPLANT  03/30/2016   COCHLEAR IMPLANT Right 03/2015   COLONOSCOPY WITH PROPOFOL   04/23/2012   DIAGNOSTIC LAPAROSCOPY  01/14/2006   I've had several (07/23/2017)   ESOPHAGOGASTRODUODENOSCOPY (EGD) WITH PROPOFOL   04/23/2012   JOINT REPLACEMENT     KNEE SURGERY Right 2014   LAPAROSCOPIC OVARIAN CYSTECTOMY Right 04/16/2000   SALPINGOOPHORECTOMY Left     STAPEDECTOMY Left 05/14/2006   Thyroid  Nodule removed  1997   TONSILLECTOMY     TOTAL ABDOMINAL HYSTERECTOMY  01/13/2004   Endometriosis with residual right ovary. Multiple GYN surgeries for chronic pelvic pain; had LSO in past   TUBAL LIGATION     WOUND EXPLORATION Right 10/16/2016   Procedure: RIGHT EXPLORATION OF LACERATION OF INDEX FINGER;  Surgeon: Franky Curia, MD;  Location: [facility];  Service: Orthopedics;  Laterality: Right;    Current Medications: Current Meds  Medication Sig   Ascorbic Acid (VITAMIN C PO) Take 1 tablet by mouth daily.   busPIRone  (BUSPAR ) 10 MG tablet TAKE 2 TABLETS BY MOUTH 3 TIMES A DAY   clonazePAM  (KLONOPIN ) 0.5 MG tablet Take 1 tablet (0.5 mg total) by mouth 2 (two) times daily as needed for anxiety.   estradiol  (ESTRACE ) 1 MG tablet TAKE 1 TABLET BY MOUTH EVERY DAY   fluticasone  (FLONASE ) 50 MCG/ACT nasal spray SPRAY 1 SPRAY INTO EACH NOSTRIL DAILY   levothyroxine  (SYNTHROID ) 25 MCG tablet TAKE 1 TABLET BY MOUTH EVERY DAY   pantoprazole  (PROTONIX ) 40 MG tablet TAKE 1 TABLET BY MOUTH EVERY DAY   promethazine  (PHENERGAN ) 12.5 MG tablet Take 1 tablet (12.5 mg total) by mouth every 6 (six) hours as needed for nausea or vomiting.   traMADol  (ULTRAM ) 50 MG tablet TAKE 1 TABLET BY MOUTH EVERY 6 HOURS AS NEEDED FOR SEVERE PAIN     Allergies:   Onion, Other, Shellfish allergy, Amitriptyline , Amoxicillin -pot clavulanate, Amoxicillin -pot clavulanate, Cefuroxime, Cephalexin , Ciprofloxacin , Clarithromycin, Clarithromycin, Doxycycline , Propranolol , Sulfa antibiotics, Sulfasalazine, Sulfonamide derivatives, Bacitracin, Ondansetron , Tramadol  hcl, Benadryl  [diphenhydramine  hcl (sleep)], and Shrimp flavor agent (non-screening)   Social History   Socioeconomic History   Marital status: Married    Spouse name: Not on file   Number of children: 2   Years of education: Not on file   Highest education level: Not on file  Occupational History   Occupation:  Immunologist    Comment: works in [place]   Tobacco Use   Smoking status: Former    Current packs/day: 0.00    Average packs/day: 1 pack/day for 26.0 years (26.0 ttl pk-yrs)    Types: Cigarettes    Start date: 02/17/1981    Quit date: 02/18/2007    Years since quitting: 17.0   Smokeless tobacco: Never   Tobacco comments:    quit 2008  Vaping Use   Vaping status: Never Used  Substance and Sexual Activity   Alcohol use: Yes    Comment: Occasionally.   Drug use: No   Sexual activity: Not Currently    Partners: Male    Birth control/protection: Surgical  Other Topics Concern   Not on file  Social History Narrative   Current smoker, not using alcohol or drugs at this time   Lives with husband and 2 children   Social Drivers of Corporate investment banker  Strain: Not on file  Food Insecurity: Low Risk  (06/27/2023)   Received from [facility]   Hunger Vital Sign    Within the past 12 months, you worried that your food would run out before you got money to buy more: Never true    Within the past 12 months, the food you bought just didn't last and you didn't have money to get more. : Never true  Transportation Needs: No Transportation Needs (06/27/2023)   Received from [facility]    In the past 12 months, has lack of reliable transportation kept you from medical appointments, meetings, work or from getting things needed for daily living? : No  Physical Activity: Not on file  Stress: Not on file  Social Connections: Not on file     Family History: The patient's family history includes Bipolar disorder in her daughter; Breast cancer in her mother; Colon cancer in her maternal grandmother; Colon polyps in her father; Diabetes in her father and maternal grandmother; Heart disease in her father and another family member; Irritable bowel syndrome in her father; Kidney disease in her father; Ovarian cancer in her mother; Prostate cancer in her father;  Stomach cancer in her maternal grandmother. There is no history of Esophageal cancer or Liver disease.  ROS:   Please see the history of present illness.    All other systems reviewed and are negative.  EKGs/Labs/Other Studies Reviewed:    The following studies were reviewed today: .SABRA   EKG reveals sinus rhythm and nonspecific ST-T changes       Recent Labs: 03/03/2024: ALT 13; BUN 6; Creatinine, Ser 0.76; Hemoglobin 13.4; Platelets 285; Potassium 4.4; Sodium 140 03/05/2024: Magnesium  2.0; TSH 2.370  Recent Lipid Panel    Component Value Date/Time   CHOL 185 06/13/2009 2042   TRIG 153 (H) 06/13/2009 2042   HDL 37 (L) 06/13/2009 2042   CHOLHDL 5.0 Ratio 06/13/2009 2042   VLDL 31 06/13/2009 2042   LDLCALC 117 (H) 06/13/2009 2042    Physical Exam:    VS:  BP 124/70   Pulse 81   Ht 5' (1.524 m)   Wt 188 lb (85.3 kg)   LMP 12/26/2004   SpO2 98%   BMI 36.72 kg/m     Wt Readings from Last 3 Encounters:  03/19/24 188 lb (85.3 kg)  03/05/24 186 lb 9.6 oz (84.6 kg)  03/03/24 189 lb 9.5 oz (86 kg)     GEN: Patient is in no acute distress HEENT: Normal NECK: No JVD; No carotid bruits LYMPHATICS: No lymphadenopathy CARDIAC: S1 S2 regular, 2/6 systolic murmur at the apex. RESPIRATORY:  Clear to auscultation without rales, wheezing or rhonchi  ABDOMEN: Soft, non-tender, non-distended MUSCULOSKELETAL:  No edema; No deformity  SKIN: Warm and dry NEUROLOGIC:  Alert and oriented x 3 PSYCHIATRIC:  Normal affect    Signed, Jennifer JONELLE Crape, MD  03/19/2024 3:10 PM    [facility]

## 2024-03-23 ENCOUNTER — Ambulatory Visit: Admitting: Neurology

## 2024-03-23 ENCOUNTER — Encounter: Payer: Self-pay | Admitting: Neurology

## 2024-03-23 VITALS — BP 133/84 | HR 68 | Ht 60.0 in | Wt 187.0 lb

## 2024-03-23 DIAGNOSIS — G43009 Migraine without aura, not intractable, without status migrainosus: Secondary | ICD-10-CM

## 2024-03-23 NOTE — Progress Notes (Signed)
 [facility] Clinic Note - Initial Visit   Date: 03/23/2024   Donna Davis MRN: [id number] DOB: 10-30-1970   Dear Dr. Karna:  Thank you for your kind referral of Donna Davis for consultation of migraines. Although her history is well known to you, please allow us  to reiterate it for the purpose of our medical record. The patient was accompanied to the clinic by sign language interpretor who also provides collateral information.     Donna Davis is a 53 y.o.  female with sensorineural hearing loss bilaterally, hypothyroidism, and GERD presenting for evaluation of migraines.   IMPRESSION/PLAN: Post concussion headaches, improving Episodic migraine headaches without aura  Headache frequency has markedly improved over the past 2 months and now only gets them 1-2 times per week, which is responsive to tramadol  (prescribed by PCP).  Discussed to limit prn medications to twice per week to prevent medication overuse headaches.  Given that symptoms are getting better, no need to start preventative therapy.  Previously tried: nortriptyline, topiramate .    Return to clinic as needed  ------------------------------------------------------------- History of present illness: She reports having issues with right cochlear implant and she began having worsening headaches.  She had this removed in the fall of 2024, which improved her pain.  However, in December, she slipped off an inversion table after her foot bracket broke off and sustained blunt head injury.  Since this fall, she was having worsening headaches.  She saw Sports Medicine who treated her for concussion and headaches.  She was tried on topiramate , nortriptyline, and nurtec, which she did not tolerate or provide significant relief.  She was approved for Emgality , but did not take this due to potential side effects.  About two months ago, she noticed reduced frequency of headaches. She reports having headaches are  sporadic and occur 1-2 times per week, and lasts anywhere from a few days to a few hours.  She endorse photophobia, nasuea/vomiting, and gets sweaty.  She has tried tylenol , NSAIDs (GI upset), and tramadol . Tramadol  provides the most relief.     Out-side paper records, electronic medical record, and images have been reviewed where available and summarized as:  Lab Results  Component Value Date   HGBA1C 5.1 11/07/2009   No results found for: [facility] Lab Results  Component Value Date   TSH 2.370 03/05/2024   Lab Results  Component Value Date   ESRSEDRATE 26 01/08/2023    Past Medical History:  Diagnosis Date   Amitriptyline  adverse reaction 04/08/2013   Anxiety    Clostridium difficile infection    x several times   Deaf    uses sign language   Endometriosis    s/p TAH, LSO   Family history of malignant neoplasm of gastrointestinal tract    Gallstones    GERD (gastroesophageal reflux disease)    Hearing difficulty    b/l, 2/2 congenital rubella s/p stapedectomy. Using hearing aid   Hepatic cyst    6 mm on CT done done in 05/2005   Hypothyroidism    IBS (irritable bowel syndrome)    Migraines    frequency depends on my stress level (07/23/2017)   Obesity    PONV (postoperative nausea and vomiting)    sometimes; depends on how long I'm under   Shingles 03/2013    Past Surgical History:  Procedure Laterality Date   ABDOMINAL ADHESION SURGERY     Enterolysis   CESAREAN SECTION  1994   CHOLECYSTECTOMY N/A 06/26/2013  Procedure: LAPAROSCOPIC CHOLECYSTECTOMY WITH ATTEMPTED INTRAOPERATIVE CHOLANGIOGRAM;  Surgeon: Vicenta DELENA Poli, MD;  Location: [facility];  Service: General;  Laterality: N/A;   COCHLEAR IMPLANT  03/30/2016   COCHLEAR IMPLANT Right 03/2015   COLONOSCOPY WITH PROPOFOL   04/23/2012   DIAGNOSTIC LAPAROSCOPY  01/14/2006   I've had several (07/23/2017)   ESOPHAGOGASTRODUODENOSCOPY (EGD) WITH PROPOFOL   04/23/2012   JOINT REPLACEMENT     KNEE SURGERY  Right 2014   LAPAROSCOPIC OVARIAN CYSTECTOMY Right 04/16/2000   SALPINGOOPHORECTOMY Left    STAPEDECTOMY Left 05/14/2006   Thyroid  Nodule removed  1997   TONSILLECTOMY     TOTAL ABDOMINAL HYSTERECTOMY  01/13/2004   Endometriosis with residual right ovary. Multiple GYN surgeries for chronic pelvic pain; had LSO in past   TUBAL LIGATION     WOUND EXPLORATION Right 10/16/2016   Procedure: RIGHT EXPLORATION OF LACERATION OF INDEX FINGER;  Surgeon: Franky Curia, MD;  Location: [facility];  Service: Orthopedics;  Laterality: Right;     Medications:  Outpatient Encounter Medications as of 03/23/2024  Medication Sig Note   Ascorbic Acid (VITAMIN C PO) Take 1 tablet by mouth daily. 08/23/2017: Only takes if feels like getting sick   busPIRone  (BUSPAR ) 10 MG tablet TAKE 2 TABLETS BY MOUTH 3 TIMES A DAY    clonazePAM  (KLONOPIN ) 0.5 MG tablet Take 1 tablet (0.5 mg total) by mouth 2 (two) times daily as needed for anxiety.    estradiol  (ESTRACE ) 1 MG tablet TAKE 1 TABLET BY MOUTH EVERY DAY    fluticasone  (FLONASE ) 50 MCG/ACT nasal spray SPRAY 1 SPRAY INTO EACH NOSTRIL DAILY    levothyroxine  (SYNTHROID ) 25 MCG tablet TAKE 1 TABLET BY MOUTH EVERY DAY    pantoprazole  (PROTONIX ) 40 MG tablet TAKE 1 TABLET BY MOUTH EVERY DAY    promethazine  (PHENERGAN ) 12.5 MG tablet Take 1 tablet (12.5 mg total) by mouth every 6 (six) hours as needed for nausea or vomiting.    traMADol  (ULTRAM ) 50 MG tablet TAKE 1 TABLET BY MOUTH EVERY 6 HOURS AS NEEDED FOR SEVERE PAIN    No facility-administered encounter medications on file as of 03/23/2024.    Allergies:  Allergies  Allergen Reactions   Onion Anaphylaxis   Other Other (See Comments)    Ossmo Prep - drop in BP Steroids Went crazy   Shellfish Allergy Anaphylaxis and Swelling    *pt denies    Amitriptyline  Anxiety, Palpitations and Other (See Comments)    hallucinations   Amoxicillin -Pot Clavulanate Nausea And Vomiting    With high dose, low dose  ok   Amoxicillin -Pot Clavulanate Nausea And Vomiting    With high dose, low dose ok   Cefuroxime Nausea And Vomiting   Cephalexin  Other (See Comments)     gi upset  gi upset   Ciprofloxacin  Other (See Comments)     Fevers   Clarithromycin Nausea And Vomiting and Other (See Comments)    Pt is OK with azithromycin   gi upset   Clarithromycin Nausea And Vomiting    Pt is OK with azithromycin    Doxycycline  Other (See Comments) and Hives     gi upset  gi upset   Propranolol  Other (See Comments)    Fatigue and makes her feel crazy, refuses to take.   Sulfa Antibiotics Other (See Comments)    Rash, vaginal blisters   Sulfasalazine Itching    Rash, vaginal blisters Rash, vaginal blisters    Sulfonamide Derivatives Other (See Comments)    vaginal blisters   Bacitracin Hives  Redness, pain at site of application   Ondansetron  Other (See Comments)    CAUSES SEVERE MIGRAINES  Other reaction(s): Headache  CAUSES SEVERE MIGRAINES  CAUSES SEVERE MIGRAINES   Tramadol  Hcl Other (See Comments)    Other reaction(s): OTHER  Other reaction(s): Other (See Comments)  Other reaction(s): OTHER   Benadryl  [Diphenhydramine  Hcl (Sleep)] Anxiety    Extreme agitation   Shrimp Flavor Agent (Non-Screening) Other (See Comments)    unknown    Family History: Family History  Problem Relation Age of Onset   Breast cancer Mother    Ovarian cancer Mother    Stroke Father    Neuropathy Father    Diabetes Father    Heart disease Father        heart attack   Irritable bowel syndrome Father    Kidney disease Father    Stomach cancer Maternal Grandmother    Colon cancer Maternal Grandmother    Diabetes Maternal Grandmother    Bipolar disorder Daughter    Heart disease Other        both grandmothers   Esophageal cancer Neg Hx    Liver disease Neg Hx     Social History: Social History   Tobacco Use   Smoking status: Former    Current packs/day: 0.00    Average packs/day: 1 pack/day  for 26.0 years (26.0 ttl pk-yrs)    Types: Cigarettes    Start date: 02/17/1981    Quit date: 02/18/2007    Years since quitting: 17.1   Smokeless tobacco: Never   Tobacco comments:    quit 2008  Vaping Use   Vaping status: Never Used  Substance Use Topics   Alcohol use: Yes    Comment: Very Rarely   Drug use: No   Social History   Social History Narrative   Current smoker, not using alcohol or drugs at this timeLives with husband and 2 children      Are you right handed or left handed? Right Handed    Are you currently employed ? 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 No    Who lives with you? Husband and 85 pound dog    What type of home do you live in: 1 story or 2 story? Lives in a one story home         Vital Signs:  BP 133/84   Pulse 68   Ht 5' (1.524 m)   Wt 187 lb (84.8 kg)   LMP 12/26/2004   SpO2 98%   BMI 36.52 kg/m   Neurological Exam: MENTAL STATUS including orientation to time, place, person, recent and remote memory, attention span and concentration, language, and fund of knowledge is normal.  [place].  CRANIAL NERVES: II:  No visual field defects.     III-IV-VI: Pupils equal round and reactive to light.  Normal conjugate, extra-ocular eye movements in all directions of gaze.  No nystagmus.  No ptosis.   V:  Normal facial sensation.    VII:  Normal facial symmetry and movements.   VIII:  Hearing impairment bilaterally.   IX-X:  Normal palatal movement.   XI:  Normal shoulder shrug and head rotation.   XII:  Normal tongue strength and range of motion, no deviation or fasciculation.  MOTOR:  Motor strength is 5/5 throughout.  No atrophy, fasciculations or abnormal movements.  No pronator drift.   MSRs:  Right        Left brachioradialis 2+  2+  biceps 2+  2+  triceps 2+  2+  patellar 2+  2+  ankle jerk 2+  2+   SENSORY:  Normal and symmetric perception of light  touch, vibration and temperature.  COORDINATION/GAIT: Normal finger-to- nose-finger.  Intact rapid alternating movements bilaterally.  Gait narrow based and stable.    Thank you for allowing me to participate in patient's care.  If I can answer any additional questions, I would be pleased to do so.    Sincerely,    Dareion Kneece K. Tobie, DO

## 2024-03-27 ENCOUNTER — Ambulatory Visit (HOSPITAL_COMMUNITY)
Admission: RE | Admit: 2024-03-27 | Discharge: 2024-03-27 | Disposition: A | Discharge status: Home / Self Care | Source: Ambulatory Visit | Attending: Internal Medicine | Admitting: Internal Medicine

## 2024-03-27 DIAGNOSIS — I5030 Unspecified diastolic (congestive) heart failure: Secondary | ICD-10-CM

## 2024-03-27 DIAGNOSIS — R011 Cardiac murmur, unspecified: Secondary | ICD-10-CM | POA: Insufficient documentation

## 2024-03-27 LAB — ECHOCARDIOGRAM COMPLETE
Area-P 1/2: 3.42 cm2
S' Lateral: 2.5 cm

## 2024-03-31 ENCOUNTER — Ambulatory Visit: Payer: Self-pay | Admitting: Cardiology

## 2024-04-07 ENCOUNTER — Encounter: Payer: Self-pay | Admitting: Internal Medicine

## 2024-04-07 ENCOUNTER — Ambulatory Visit: Admitting: Internal Medicine

## 2024-04-07 VITALS — BP 121/73 | HR 70 | Temp 98.0°F | Ht 60.0 in | Wt 187.8 lb

## 2024-04-07 DIAGNOSIS — R739 Hyperglycemia, unspecified: Secondary | ICD-10-CM

## 2024-04-07 DIAGNOSIS — G43009 Migraine without aura, not intractable, without status migrainosus: Secondary | ICD-10-CM | POA: Diagnosis not present

## 2024-04-07 DIAGNOSIS — Z23 Encounter for immunization: Secondary | ICD-10-CM

## 2024-04-07 DIAGNOSIS — Z1231 Encounter for screening mammogram for malignant neoplasm of breast: Secondary | ICD-10-CM

## 2024-04-07 DIAGNOSIS — F411 Generalized anxiety disorder: Secondary | ICD-10-CM

## 2024-04-07 DIAGNOSIS — E7800 Pure hypercholesterolemia, unspecified: Secondary | ICD-10-CM | POA: Diagnosis not present

## 2024-04-07 DIAGNOSIS — Z79890 Hormone replacement therapy (postmenopausal): Secondary | ICD-10-CM

## 2024-04-07 DIAGNOSIS — R002 Palpitations: Secondary | ICD-10-CM

## 2024-04-07 HISTORY — DX: Hormone replacement therapy: Z79.890

## 2024-04-07 HISTORY — DX: Hyperglycemia, unspecified: R73.9

## 2024-04-07 HISTORY — DX: Encounter for screening mammogram for malignant neoplasm of breast: Z12.31

## 2024-04-07 HISTORY — DX: Pure hypercholesterolemia, unspecified: E78.00

## 2024-04-07 NOTE — Assessment & Plan Note (Signed)
 Due for screening mammography. Donna Davis does not wish to schedule soon given ongoing cardiac evaluation but order placed for future scheduling when she is ready.

## 2024-04-07 NOTE — Progress Notes (Signed)
 Established Patient Office Visit  Subjective   Patient ID: Donna Davis, female    DOB: 1971-01-21  Age: 53 y.o. MRN: [id number]  Chief Complaint  Patient presents with   Follow-up    Saw cardiologist for irregular heartbeat. Recently passed out.          Donna Davis returns for follow-up of chronic medical conditions, as well to discuss ongoing cardiac evaluation and recent syncopal event. Please see assessment/plan in problem-based charting for further details of today's visit.     Patient Active Problem List   Diagnosis Date Noted   On hormone replacement therapy 04/07/2024   Hyperglycemia 04/07/2024   Elevated LDL cholesterol level 04/07/2024   Screening mammogram for breast cancer 04/07/2024   Cardiac murmur 03/19/2024   Chest pain of uncertain etiology 03/19/2024   Frequent PVCs 03/19/2024   Palpitations 03/05/2024   Screening for colon cancer 10/22/2023   Need for Tdap vaccination 10/22/2023   Need for pneumococcal vaccine 10/22/2023   Low back pain 10/09/2022   Pain in thoracic spine 10/09/2022   Arthralgia of right temporomandibular joint 08/26/2018   Allergic rhinitis due to allergen 12/19/2016   Bilateral deafness 01/17/2016   Family history of breast cancer in mother 08/26/2015   Obesity (BMI 35.0-39.9 without comorbidity) 02/18/2014   Migraine 12/02/2012   Routine adult health maintenance 09/01/2012   Anxiety state 09/03/2006   Hypothyroidism 07/01/2006   GERD 07/01/2006   IBS 07/01/2006      Objective:     BP 121/73 (BP Location: Right Arm, Patient Position: Sitting)   Pulse 70   Temp 98 F (36.7 C) (Oral)   Ht 5' (1.524 m)   Wt 187 lb 12.8 oz (85.2 kg)   LMP 12/26/2004   SpO2 100% Comment: RA  BMI 36.68 kg/m  BP Readings from Last 3 Encounters:  04/07/24 121/73  03/23/24 133/84  03/19/24 124/70   Wt Readings from Last 3 Encounters:  04/07/24 187 lb 12.8 oz (85.2 kg)  03/23/24 187 lb (84.8 kg)  03/19/24 188 lb (85.3 kg)     Physical Exam Vitals reviewed.  Constitutional:      General: She is not in acute distress.    Appearance: Normal appearance.  Cardiovascular:     Rate and Rhythm: Normal rate and regular rhythm.     Heart sounds: Normal heart sounds.  Pulmonary:     Effort: Pulmonary effort is normal.     Breath sounds: Normal breath sounds.  Neurological:     Mental Status: She is alert. Mental status is at baseline.  Psychiatric:        Mood and Affect: Mood normal.        Behavior: Behavior normal.      Assessment & Plan:   Problem List Items Addressed This Visit       Cardiovascular and Mediastinum   Migraine   Much improved. Frequency of migraine headaches improved 1-2x/weekly. Not having right-sided neuropathic/neuralgia-type pain at this time. Remains on tramadol  prn.        Other   Anxiety state   Treated with buspirone  20 mg tid and clonazepam  0.5 mg bid prn. Well controlled currently. Previously on Xanax  since at least 2012, switched to clonazepam  through sports medicine following concussion earlier this year. Has not tolerated several SSRIs in the past. Today, we discussed potential side effects from long-term benzodiazepine use and consideration of taper/discontinuation at a future date. No changes today.      Need for Tdap vaccination  Due for Tdap. Patient requests to wait until next visit.      Need for pneumococcal vaccine   Due for pneumonia vaccination. Patient requests to wait until next visit.      Palpitations   Continues to have almost daily palpitations since onset in June. Describes this as fluttering in her chest, accompanied by chest pressure. Sometimes will last for 15-20 minutes, longest was 4 hours. She becomes quite fatigued when episodes last >30 minutes. She did have an episode of syncope this past weekend in which she walked out of her house while feeling fluttering, pounding in her chest, and pressure, sat down in a golf cart and lost  consciousness. Episode was not witnessed, Donna Davis feels it lasted a short time. No additional episodes of syncope with symptoms.   Established with cardiology. TTE without significant findings. Recently completed a 14 day monitor, results pending. This was not in place during syncopal episode. Stress echo pending. Cardiac exam regular rate and rhythm today.  Plan -Continue f/u with cardiology, message sent to Dr. Jory -Orthostatic VS negative -F/u monitor and stress echo -Syncope precautions discussed  Addendum 7/24 Discussed with patient's cardiologist, Dr. Revankar. Patient will undergo continued cardiac monitoring and f/u appointment. She has been advised not to drive. High suspicion for cardiac syncope and she will discuss cardiac clearance with Dr. Edwyna. Lower suspicion for neurogenic cause but have advised f/u appointment with neurology. No additional, non-cardiac etiology evident at recent visit. Will schedule appointment in [facility] in 2 weeks for f/u.        On hormone replacement therapy   Remains on estradiol  since remote hysterectomy/oophorectomy for endometriosis and recurrent ovarian cyst. We discussed typical course of therapy following surgery until natural age of menopause with consideration of taper or discontinuation at that time to reduce side effects. Donna Davis is hesitant to make medication changes today given ongoing cardiac evaluation and otherwise clinical stability. We will continue to discuss at future visits.      Hyperglycemia   Elevated glucose noted on recent ED labs. No personal history of prediabetes or diabetes, but family history. Obesity risk factor. Will screen with A1c today.      Relevant Orders   Hemoglobin A1c   Elevated LDL cholesterol level   History of elevated cholesterol, last checked in 2010. Will recheck lipid panel today.      Relevant Orders   Lipid Profile   Screening mammogram for breast cancer - Primary   Due for screening  mammography. Donna Davis does not wish to schedule soon given ongoing cardiac evaluation but order placed for future scheduling when she is ready.      Relevant Orders   MM DIGITAL SCREENING BILATERAL    Return in about 3 months (around 07/08/2024).    Ronnald Sergeant, MD

## 2024-04-07 NOTE — Assessment & Plan Note (Signed)
 Treated with buspirone  20 mg tid and clonazepam  0.5 mg bid prn. Well controlled currently. Previously on Xanax  since at least 2012, switched to clonazepam  through sports medicine following concussion earlier this year. Has not tolerated several SSRIs in the past. Today, we discussed potential side effects from long-term benzodiazepine use and consideration of taper/discontinuation at a future date. No changes today.

## 2024-04-07 NOTE — Assessment & Plan Note (Signed)
 History of elevated cholesterol, last checked in 2010. Will recheck lipid panel today.

## 2024-04-07 NOTE — Assessment & Plan Note (Signed)
 Due for Tdap. Patient requests to wait until next visit.

## 2024-04-07 NOTE — Assessment & Plan Note (Signed)
 Continues to have almost daily palpitations since onset in June. Describes this as fluttering in her chest, accompanied by chest pressure. Sometimes will last for 15-20 minutes, longest was 4 hours. She becomes quite fatigued when episodes last >30 minutes. She did have an episode of syncope this past weekend in which she walked out of her house while feeling fluttering, pounding in her chest, and pressure, sat down in a golf cart and lost consciousness. Episode was not witnessed, [name] feels it lasted a short time. No additional episodes of syncope with symptoms.   Established with cardiology. TTE without significant findings. Recently completed a 14 day monitor, results pending. This was not in place during syncopal episode. Stress echo pending. Cardiac exam regular rate and rhythm today.  Plan -Continue f/u with cardiology, message sent to Dr. Jory -Orthostatic VS negative -F/u monitor and stress echo -Syncope precautions discussed  Addendum 7/24 Discussed with patient's cardiologist, Dr. Revankar. Patient will undergo continued cardiac monitoring and f/u appointment. She has been advised not to drive. High suspicion for cardiac syncope and she will discuss cardiac clearance with Dr. Edwyna. Lower suspicion for neurogenic cause but have advised f/u appointment with neurology. No additional, non-cardiac etiology evident at recent visit. Will schedule appointment in [facility] in 2 weeks for f/u.

## 2024-04-07 NOTE — Assessment & Plan Note (Signed)
 Remains on estradiol  since remote hysterectomy/oophorectomy for endometriosis and recurrent ovarian cyst. We discussed typical course of therapy following surgery until natural age of menopause with consideration of taper or discontinuation at that time to reduce side effects. Donna Davis is hesitant to make medication changes today given ongoing cardiac evaluation and otherwise clinical stability. We will continue to discuss at future visits.

## 2024-04-07 NOTE — Assessment & Plan Note (Signed)
 Elevated glucose noted on recent ED labs. No personal history of prediabetes or diabetes, but family history. Obesity risk factor. Will screen with A1c today.

## 2024-04-07 NOTE — Assessment & Plan Note (Signed)
 Much improved. Frequency of migraine headaches improved 1-2x/weekly. Not having right-sided neuropathic/neuralgia-type pain at this time. Remains on tramadol  prn.

## 2024-04-07 NOTE — Assessment & Plan Note (Signed)
 Due for pneumonia vaccination. Patient requests to wait until next visit.

## 2024-04-08 ENCOUNTER — Encounter: Payer: Self-pay | Admitting: Cardiology

## 2024-04-08 ENCOUNTER — Ambulatory Visit: Attending: Cardiology

## 2024-04-08 DIAGNOSIS — R55 Syncope and collapse: Secondary | ICD-10-CM

## 2024-04-09 ENCOUNTER — Encounter: Payer: Self-pay | Admitting: Internal Medicine

## 2024-04-10 ENCOUNTER — Telehealth (HOSPITAL_COMMUNITY): Payer: Self-pay

## 2024-04-10 ENCOUNTER — Other Ambulatory Visit: Payer: Self-pay

## 2024-04-10 DIAGNOSIS — H9190 Unspecified hearing loss, unspecified ear: Secondary | ICD-10-CM | POA: Insufficient documentation

## 2024-04-10 DIAGNOSIS — A498 Other bacterial infections of unspecified site: Secondary | ICD-10-CM | POA: Insufficient documentation

## 2024-04-10 DIAGNOSIS — F419 Anxiety disorder, unspecified: Secondary | ICD-10-CM | POA: Insufficient documentation

## 2024-04-10 DIAGNOSIS — K7689 Other specified diseases of liver: Secondary | ICD-10-CM | POA: Insufficient documentation

## 2024-04-10 DIAGNOSIS — E669 Obesity, unspecified: Secondary | ICD-10-CM | POA: Insufficient documentation

## 2024-04-10 DIAGNOSIS — Z800 Family history of malignant neoplasm of digestive organs: Secondary | ICD-10-CM | POA: Insufficient documentation

## 2024-04-10 DIAGNOSIS — Z9889 Other specified postprocedural states: Secondary | ICD-10-CM | POA: Insufficient documentation

## 2024-04-10 DIAGNOSIS — K8020 Calculus of gallbladder without cholecystitis without obstruction: Secondary | ICD-10-CM | POA: Insufficient documentation

## 2024-04-14 ENCOUNTER — Encounter: Payer: Self-pay | Admitting: Cardiology

## 2024-04-14 ENCOUNTER — Encounter: Payer: Self-pay | Admitting: Internal Medicine

## 2024-04-14 ENCOUNTER — Ambulatory Visit: Attending: Cardiology | Admitting: Cardiology

## 2024-04-14 VITALS — BP 136/88 | HR 80 | Ht 60.0 in | Wt 186.2 lb

## 2024-04-14 DIAGNOSIS — R002 Palpitations: Secondary | ICD-10-CM

## 2024-04-14 DIAGNOSIS — R55 Syncope and collapse: Secondary | ICD-10-CM | POA: Diagnosis not present

## 2024-04-14 DIAGNOSIS — R079 Chest pain, unspecified: Secondary | ICD-10-CM | POA: Insufficient documentation

## 2024-04-14 DIAGNOSIS — E669 Obesity, unspecified: Secondary | ICD-10-CM | POA: Diagnosis not present

## 2024-04-14 DIAGNOSIS — I2089 Other forms of angina pectoris: Secondary | ICD-10-CM | POA: Insufficient documentation

## 2024-04-14 MED ORDER — METOPROLOL SUCCINATE ER 25 MG PO TB24: 25.0000 mg | ORAL_TABLET | Freq: Every day | ORAL | 3 refills | Status: DC

## 2024-04-14 NOTE — Progress Notes (Signed)
 Cardiology Office Note:    Date:  04/14/2024   ID:  Donna Davis, Donna Davis, MRN [id number]  PCP:  [name], MD  Cardiologist:  Jennifer JONELLE Crape, MD   Referring MD: [name], MD    ASSESSMENT:    1. Palpitations   2. Obesity (BMI 35.0-39.9 without comorbidity)   3. Syncope and collapse   4. Chest pain, unspecified type    PLAN:    In order of problems listed above:  Primary prevention stressed with the patient.  Importance of compliance with diet medication stressed and patient verbalized standing. Syncope: Of unclear etiology.  Seems unlikely to be to be cardiac but we are doing a second 15-day monitoring to see if there is any issue.  She is also being evaluated by primary care and neuro for these issues.  I told her that according to guidelines she should not be driving for obvious reasons till the complete workup from multidisciplinary approach for syncope is completed.  We will do a monitor for 2 more weeks at evaluate the report. Chest pain: She is due for a stress echo. Patient will be seen in follow-up appointment in 6 months or earlier if the patient has any concerns.  She has been encouraged to be in touch with primary care and urology for further evaluation of the symptoms.  She agrees.    Medication Adjustments/Labs and Tests Ordered: Current medicines are reviewed at length with the patient today.  Concerns regarding medicines are outlined above.  No orders of the defined types were placed in this encounter.  No orders of the defined types were placed in this encounter.    No chief complaint on file.    History of Present Illness:    Donna Davis is a 53 y.o. female.  Patient has past medical history of palpitations and syncope.  She also gives history of chest pain.  Her event monitor overall has been unremarkable and reporting second 15-day monitor on her.  This was put on yesterday.  The patient had symptoms of significant palpitations last night so  hopefully we can understand her better with this set of monitoring.  No syncope during monitoring.  At the time of my evaluation, the patient is alert awake oriented and in no distress.  She is also concerned for chest pain at times and is awaiting stress test.  Past Medical History:  Diagnosis Date   Allergic rhinitis due to allergen 12/19/2016   Anxiety    Anxiety state 09/03/2006   Arthralgia of right temporomandibular joint 08/26/2018   Bilateral deafness 01/17/2016   Cardiac murmur 03/19/2024   Chest pain of uncertain etiology 03/19/2024   Clostridium difficile infection    x several times   Deaf    uses sign language   Elevated LDL cholesterol level 04/07/2024   Family history of breast cancer in mother 08/26/2015   Family history of malignant neoplasm of gastrointestinal tract    Frequent PVCs 03/19/2024   Gallstones    GERD 07/01/2006   Qualifier: Diagnosis of   By: Charmayne MD, Arlyss         Hearing difficulty    b/l, 2/2 congenital rubella s/p stapedectomy. Using hearing aid   Hepatic cyst    6 mm on CT done done in 05/2005   Hyperglycemia 04/07/2024   Hypothyroidism    IBS 07/01/2006   Qualifier: Diagnosis of   By: Charmayne MD, Arlyss         Low back  pain 10/09/2022   Migraine 12/02/2012   Obesity    Obesity (BMI 35.0-39.9 without comorbidity) 02/18/2014   On hormone replacement therapy 04/07/2024   Pain in thoracic spine 10/09/2022   Palpitations 03/05/2024   PONV (postoperative nausea and vomiting)    sometimes; depends on how long I'm under   Routine adult health maintenance 09/01/2012   MMG - Normal 2014 - she is due and I reminded her to schedule.   Pelvic - hysterectomy, no cervix.  Sees OBGYN  Flu vaccine seasonally.      DEXA - at 53yo      Screening for colon cancer 10/22/2023   Screening mammogram for breast cancer 04/07/2024    Past Surgical History:  Procedure Laterality Date   ABDOMINAL ADHESION SURGERY     Enterolysis   CESAREAN SECTION  1994    CHOLECYSTECTOMY N/A 06/26/2013   Procedure: LAPAROSCOPIC CHOLECYSTECTOMY WITH ATTEMPTED INTRAOPERATIVE CHOLANGIOGRAM;  Surgeon: Vicenta DELENA Poli, MD;  Location: [facility];  Service: General;  Laterality: N/A;   COCHLEAR IMPLANT  03/30/2016   COCHLEAR IMPLANT Right 03/2015   COLONOSCOPY WITH PROPOFOL   04/23/2012   DIAGNOSTIC LAPAROSCOPY  01/14/2006   I've had several (07/23/2017)   ESOPHAGOGASTRODUODENOSCOPY (EGD) WITH PROPOFOL   04/23/2012   JOINT REPLACEMENT     KNEE SURGERY Right 2014   LAPAROSCOPIC OVARIAN CYSTECTOMY Right 04/16/2000   SALPINGOOPHORECTOMY Left    STAPEDECTOMY Left 05/14/2006   Thyroid  Nodule removed  1997   TONSILLECTOMY     TOTAL ABDOMINAL HYSTERECTOMY  01/13/2004   Endometriosis with residual right ovary. Multiple GYN surgeries for chronic pelvic pain; had LSO in past   TUBAL LIGATION     WOUND EXPLORATION Right 10/16/2016   Procedure: RIGHT EXPLORATION OF LACERATION OF INDEX FINGER;  Surgeon: Franky Curia, MD;  Location: [facility];  Service: Orthopedics;  Laterality: Right;    Current Medications: Current Meds  Medication Sig   Ascorbic Acid (VITAMIN C PO) Take 1 tablet by mouth daily.   busPIRone  (BUSPAR ) 10 MG tablet TAKE 2 TABLETS BY MOUTH 3 TIMES A DAY   clonazePAM  (KLONOPIN ) 0.5 MG tablet Take 1 tablet (0.5 mg total) by mouth 2 (two) times daily as needed for anxiety.   estradiol  (ESTRACE ) 1 MG tablet TAKE 1 TABLET BY MOUTH EVERY DAY   fluticasone  (FLONASE ) 50 MCG/ACT nasal spray SPRAY 1 SPRAY INTO EACH NOSTRIL DAILY   levothyroxine  (SYNTHROID ) 25 MCG tablet TAKE 1 TABLET BY MOUTH EVERY DAY   pantoprazole  (PROTONIX ) 40 MG tablet TAKE 1 TABLET BY MOUTH EVERY DAY   promethazine  (PHENERGAN ) 12.5 MG tablet Take 1 tablet (12.5 mg total) by mouth every 6 (six) hours as needed for nausea or vomiting.   traMADol  (ULTRAM ) 50 MG tablet TAKE 1 TABLET BY MOUTH EVERY 6 HOURS AS NEEDED FOR SEVERE PAIN     Allergies:   Onion, Other, Shellfish allergy,  Amitriptyline , Amoxicillin -pot clavulanate, Amoxicillin -pot clavulanate, Cefuroxime, Cephalexin , Ciprofloxacin , Clarithromycin, Clarithromycin, Doxycycline , Propranolol , Sulfa antibiotics, Sulfasalazine, Sulfonamide derivatives, Bacitracin, Ondansetron , Tramadol  hcl, Benadryl  [diphenhydramine  hcl (sleep)], and Shrimp flavor agent (non-screening)   Social History   Socioeconomic History   Marital status: Married    Spouse name: Not on file   Number of children: 2   Years of education: Not on file   Highest education level: Not on file  Occupational History   Occupation: Immunologist    Comment: works in [place]   Tobacco Use   Smoking status: Former    Current packs/day: 0.00    Average  packs/day: 1 pack/day for 26.0 years (26.0 ttl pk-yrs)    Types: Cigarettes    Start date: 02/17/1981    Quit date: 02/18/2007    Years since quitting: 17.1   Smokeless tobacco: Never   Tobacco comments:    quit 2008  Vaping Use   Vaping status: Never Used  Substance and Sexual Activity   Alcohol use: Yes    Comment: Very Rarely   Drug use: No   Sexual activity: Not Currently    Partners: Male    Birth control/protection: Surgical  Other Topics Concern   Not on file  Social History Narrative   Current smoker, not using alcohol or drugs at this timeLives with husband and 2 children      Are you right handed or left handed? Right Handed    Are you currently employed ? 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 No    Who lives with you? Husband and 85 pound dog    What type of home do you live in: 1 story or 2 story? Lives in a one story home        Social Drivers of Health   Financial Resource Strain: Not on file  Food Insecurity: Low Risk  (06/27/2023)   Received from [facility]   Hunger Vital Sign    Within the past 12 months, you worried that your food would run out before you got money to buy more: Never true    Within the past  12 months, the food you bought just didn't last and you didn't have money to get more. : Never true  Transportation Needs: No Transportation Needs (06/27/2023)   Received from [facility]    In the past 12 months, has lack of reliable transportation kept you from medical appointments, meetings, work or from getting things needed for daily living? : No  Physical Activity: Not on file  Stress: Not on file  Social Connections: Not on file     Family History: The patient's family history includes Bipolar disorder in her daughter; Breast cancer in her mother; Colon cancer in her maternal grandmother; Diabetes in her father and maternal grandmother; Heart disease in her father and another family member; Irritable bowel syndrome in her father; Kidney disease in her father; Neuropathy in her father; Ovarian cancer in her mother; Stomach cancer in her maternal grandmother; Stroke in her father. There is no history of Esophageal cancer or Liver disease.  ROS:   Please see the history of present illness.    All other systems reviewed and are negative.  EKGs/Labs/Other Studies Reviewed:    The following studies were reviewed today: I discussed my findings with the patient at length   Recent Labs: 03/03/2024: ALT 13; BUN 6; Creatinine, Ser 0.76; Hemoglobin 13.4; Platelets 285; Potassium 4.4; Sodium 140 03/05/2024: Magnesium  2.0; TSH 2.370  Recent Lipid Panel    Component Value Date/Time   CHOL 185 06/13/2009 2042   TRIG 153 (H) 06/13/2009 2042   HDL 37 (L) 06/13/2009 2042   CHOLHDL 5.0 Ratio 06/13/2009 2042   VLDL 31 06/13/2009 2042   LDLCALC 117 (H) 06/13/2009 2042    Physical Exam:    VS:  BP 136/88   Pulse 80   Ht 5' (1.524 m)   Wt 186 lb 3.2 oz (84.5 kg)   LMP 12/26/2004   SpO2 96%   BMI 36.36 kg/m  Wt Readings from Last 3 Encounters:  04/14/24 186 lb 3.2 oz (84.5 kg)  04/07/24 187 lb 12.8 oz (85.2 kg)  03/23/24 187 lb (84.8 kg)     GEN: Patient is in  no acute distress HEENT: Normal NECK: No JVD; No carotid bruits LYMPHATICS: No lymphadenopathy CARDIAC: Hear sounds regular, 2/6 systolic murmur at the apex. RESPIRATORY:  Clear to auscultation without rales, wheezing or rhonchi  ABDOMEN: Soft, non-tender, non-distended MUSCULOSKELETAL:  No edema; No deformity  SKIN: Warm and dry NEUROLOGIC:  Alert and oriented x 3 PSYCHIATRIC:  Normal affect   Signed, Jennifer JONELLE Crape, MD  04/14/2024 2:04 PM    [facility]

## 2024-04-14 NOTE — Patient Instructions (Signed)
 Medication Instructions:  Your physician has recommended you make the following change in your medication:   Start Toprol  XL 25 mg daily.  *If you need a refill on your cardiac medications before your next appointment, please call your pharmacy*   Lab Work: None ordered If you have labs (blood work) drawn today and your tests are completely normal, you will receive your results only by: MyChart Message (if you have MyChart) OR A paper copy in the mail If you have any lab test that is abnormal or we need to change your treatment, we will call you to review the results.   Testing/Procedures: None ordered   Follow-Up: At [facility], you and your health needs are our priority.  As part of our continuing mission to provide you with exceptional heart care, we have created designated Provider Care Teams.  These Care Teams include your primary Cardiologist (physician) and Advanced Practice Providers (APPs -  Physician Assistants and Nurse Practitioners) who all work together to provide you with the care you need, when you need it.  We recommend signing up for the patient portal called MyChart.  Sign up information is provided on this After Visit Summary.  MyChart is used to connect with patients for Virtual Visits (Telemedicine).  Patients are able to view lab/test results, encounter notes, upcoming appointments, etc.  Non-urgent messages can be sent to your provider as well.   To learn more about what you can do with MyChart, go to [email or web address].    Your next appointment:   9 month(s)  The format for your next appointment:   In Person  Provider:   Jennifer Crape, MD    Other Instructions none  Important Information About Sugar

## 2024-04-15 ENCOUNTER — Telehealth: Payer: Self-pay | Admitting: Neurology

## 2024-04-15 NOTE — Telephone Encounter (Signed)
 She is deaf so she likes to communicate though mychart, but she does have a phone interpreter service for when you call her.

## 2024-04-15 NOTE — Telephone Encounter (Signed)
 Best to discuss these symptoms with her cardiologist and looks like she has an appointment on 8/1.

## 2024-04-15 NOTE — Telephone Encounter (Signed)
 Mychart message sent to patient.

## 2024-04-15 NOTE — Telephone Encounter (Signed)
 Pt was experiencing Heart flutters, nothing wrong with heart per MD. A few weeks ago she went inside and got something and when she went back outside to sit down she passed out. She told her PCP this and they stated she needed to tell her Neurologist. Her PCP doesn't want her to drive until she finds out what happened. She can work from home for the next week and after that she has to start driving again.

## 2024-04-16 ENCOUNTER — Other Ambulatory Visit: Payer: Self-pay | Admitting: Internal Medicine

## 2024-04-16 DIAGNOSIS — R002 Palpitations: Secondary | ICD-10-CM

## 2024-04-16 DIAGNOSIS — U071 COVID-19: Secondary | ICD-10-CM

## 2024-04-17 ENCOUNTER — Ambulatory Visit (HOSPITAL_COMMUNITY)
Admission: RE | Admit: 2024-04-17 | Discharge: 2024-04-17 | Disposition: A | Discharge status: Home / Self Care | Source: Ambulatory Visit | Attending: Cardiology | Admitting: Cardiology

## 2024-04-17 ENCOUNTER — Emergency Department (HOSPITAL_COMMUNITY)

## 2024-04-17 ENCOUNTER — Other Ambulatory Visit: Payer: Self-pay

## 2024-04-17 ENCOUNTER — Emergency Department (HOSPITAL_COMMUNITY)
Admission: EM | Admit: 2024-04-17 | Discharge: 2024-04-17 | Disposition: A | Discharge status: Home / Self Care | Source: Home / Self Care | Attending: Emergency Medicine | Admitting: Emergency Medicine

## 2024-04-17 ENCOUNTER — Encounter: Payer: Self-pay | Admitting: Cardiology

## 2024-04-17 DIAGNOSIS — E039 Hypothyroidism, unspecified: Secondary | ICD-10-CM | POA: Insufficient documentation

## 2024-04-17 DIAGNOSIS — R079 Chest pain, unspecified: Secondary | ICD-10-CM | POA: Insufficient documentation

## 2024-04-17 DIAGNOSIS — R9439 Abnormal result of other cardiovascular function study: Secondary | ICD-10-CM | POA: Diagnosis not present

## 2024-04-17 DIAGNOSIS — Z79899 Other long term (current) drug therapy: Secondary | ICD-10-CM | POA: Insufficient documentation

## 2024-04-17 DIAGNOSIS — Z87891 Personal history of nicotine dependence: Secondary | ICD-10-CM | POA: Insufficient documentation

## 2024-04-17 DIAGNOSIS — I200 Unstable angina: Secondary | ICD-10-CM | POA: Diagnosis not present

## 2024-04-17 DIAGNOSIS — I25110 Atherosclerotic heart disease of native coronary artery with unstable angina pectoris: Secondary | ICD-10-CM | POA: Diagnosis not present

## 2024-04-17 DIAGNOSIS — R072 Precordial pain: Secondary | ICD-10-CM

## 2024-04-17 LAB — BASIC METABOLIC PANEL WITH GFR
Anion gap: 10 (ref 5–15)
BUN: 10 mg/dL (ref 6–20)
CO2: 24 mmol/L (ref 22–32)
Calcium: 9.4 mg/dL (ref 8.9–10.3)
Chloride: 105 mmol/L (ref 98–111)
Creatinine, Ser: 0.68 mg/dL (ref 0.44–1.00)
GFR, Estimated: >60 mL/min (ref >60)
Glucose, Bld: 87 mg/dL (ref 70–99)
Potassium: 3.9 mmol/L (ref 3.5–5.1)
Sodium: 139 mmol/L (ref 135–145)

## 2024-04-17 LAB — CBC
HCT: 41.5 % (ref 36.0–46.0)
Hemoglobin: 13.9 g/dL (ref 12.0–15.0)
MCH: 31 pg (ref 26.0–34.0)
MCHC: 33.5 g/dL (ref 30.0–36.0)
MCV: 92.6 fL (ref 80.0–100.0)
Platelets: 281 K/uL (ref 150–400)
RBC: 4.48 MIL/uL (ref 3.87–5.11)
RDW: 11.5 % (ref 11.5–15.5)
WBC: 10.1 K/uL (ref 4.0–10.5)
nRBC: 0 % (ref 0.0–0.2)

## 2024-04-17 LAB — TROPONIN I (HIGH SENSITIVITY)
Troponin I (High Sensitivity): 4 ng/L (ref <18)
Troponin I (High Sensitivity): 6 ng/L (ref <18)

## 2024-04-17 LAB — CBG MONITORING, ED: Glucose-Capillary: 87 mg/dL (ref 70–99)

## 2024-04-17 MED ORDER — MORPHINE SULFATE (PF) 4 MG/ML IV SOLN
4.0000 mg | Freq: Once | INTRAVENOUS | Status: AC
  Administered 2024-04-17: 4 mg via INTRAVENOUS
  Filled 2024-04-17: qty 1

## 2024-04-17 MED ORDER — ISOSORBIDE MONONITRATE ER 30 MG PO TB24: 30.0000 mg | ORAL_TABLET | Freq: Every day | ORAL | 0 refills | Status: DC

## 2024-04-17 MED ORDER — PERFLUTREN LIPID MICROSPHERE
6.0000 mL | INTRAVENOUS | Status: DC | PRN
  Administered 2024-04-17: 6 mL via INTRAVENOUS

## 2024-04-17 MED ORDER — NITROGLYCERIN 0.4 MG SL SUBL: 0.4000 mg | SUBLINGUAL_TABLET | SUBLINGUAL | Status: DC | PRN

## 2024-04-17 MED FILL — Perflutren Lipid Microsphere IV Susp 1.1 MG/ML: INTRAVENOUS | Qty: 6 | Status: AC

## 2024-04-17 NOTE — ED Provider Notes (Signed)
  Physical Exam  BP 134/63   Pulse 70   Temp 98.2 F (36.8 C) (Oral)   Resp 15   Ht 5' (1.524 m)   Wt 84.5 kg   LMP 12/26/2004   SpO2 100%   BMI 36.36 kg/m   Physical Exam  Procedures  Procedures  ED Course / MDM    Medical Decision Making Care assumed at 4 PM.  Patient was having chest pain after stress test and had an abnormal stress test per cardiology.  Signout pending troponin and cardiology consult  8:17 PM Troponin negative x 2.  Dr. Jeffrie from cardiology talk to the patient.  Recommend Imdur 30 mg daily.  Cardiology will call the patient to schedule follow-up and likely will need their cath or cardiac CT   Amount and/or Complexity of Data Reviewed Labs: ordered. Decision-making details documented in ED Course. Radiology: ordered and independent interpretation performed. Decision-making details documented in ED Course.  Risk Prescription drug management.          Patt Alm Macho, MD 04/17/24 2018

## 2024-04-17 NOTE — ED Notes (Signed)
 CCMD called and notified

## 2024-04-17 NOTE — Progress Notes (Signed)
 DOD progress note  Will from noninvasive lab reached out to discuss the patient's care.  Patient came in for stress echo today for evaluation of chest pain.  During stress patient was having severe exercise limiting chest pain, was told that she had ST depressions in 2 3 and aVF, symptomatically felt nauseated and tired.  She was not hypotensive with exercise  3 minutes into recovery of blood pressure 192/72.  7 minutes into recovery she continues to have chest pain and blood pressure 163/73.  Last progress from Dr. Edwyna reviewed.  Due to ongoing chest pain, EKG changes, and symptoms patient will be transferred to ED for further evaluation and management.  Will notify the DOD at the hospital.   Madonna Large, DO, [facility]  [facility]  A Division of [facility] [address] [place], [place] [address]  [place], [place] [address]

## 2024-04-17 NOTE — ED Provider Notes (Signed)
 [place] EMERGENCY DEPARTMENT AT [facility] Provider Note   CSN: [id number] Arrival date & time: 04/17/24  1417   Sign language interpreter used  Patient presents with: Chest Pain   Donna Davis is a 53 y.o. female.    Chest Pain  Patient has history of hypothyroidism anxiety migraines IBS acid reflux, palpitations, deafness.  Patient presented to the ED for evaluation of chest pain.  Patient states she has been having issues with chest pain.  She has been evaluated by cardiology.  Patient was having a stress echocardiogram performed today.  While she was there she started experiencing severe pain in her chest.  Patient had to stop her exercise.  She was noted to have ST depressions in 2 3 and aVF.  Patient also felt nauseated and tired.  Patient continues to have chest pain.  She was sent to the ED for further evaluation.  Prior to Admission medications   Medication Sig Start Date End Date Taking? Authorizing Provider  Ascorbic Acid (VITAMIN C PO) Take 1 tablet by mouth daily.    [provider]  busPIRone  (BUSPAR ) 10 MG tablet TAKE 2 TABLETS BY MOUTH 3 TIMES A DAY 02/24/24   [name], MD  clonazePAM  (KLONOPIN ) 0.5 MG tablet Take 1 tablet (0.5 mg total) by mouth 2 (two) times daily as needed for anxiety. 03/04/24   Joane Artist RAMAN, MD  estradiol  (ESTRACE ) 1 MG tablet TAKE 1 TABLET BY MOUTH EVERY DAY 04/09/23   [name], MD  fluticasone  (FLONASE ) 50 MCG/ACT nasal spray SPRAY 1 SPRAY INTO EACH NOSTRIL EVERY DAY 04/16/24   [name], MD  levothyroxine  (SYNTHROID ) 25 MCG tablet TAKE 1 TABLET BY MOUTH EVERY DAY 08/26/23   [name], MD  metoprolol  succinate (TOPROL  XL) 25 MG 24 hr tablet Take 1 tablet (25 mg total) by mouth daily. 04/14/24   Revankar, Jennifer SAUNDERS, MD  pantoprazole  (PROTONIX ) 40 MG tablet TAKE 1 TABLET BY MOUTH EVERY DAY 02/24/24   [name], MD  promethazine  (PHENERGAN ) 12.5 MG tablet Take 1 tablet (12.5 mg total) by mouth every 6 (six) hours as needed for  nausea or vomiting. 10/19/22   [name], MD  traMADol  (ULTRAM ) 50 MG tablet TAKE 1 TABLET BY MOUTH EVERY 6 HOURS AS NEEDED FOR SEVERE PAIN 03/03/24   [name], MD    Allergies: Onion, Other, Shellfish allergy, Amitriptyline , Amoxicillin -pot clavulanate, Amoxicillin -pot clavulanate, Cefuroxime, Cephalexin , Ciprofloxacin , Clarithromycin, Clarithromycin, Doxycycline , Propranolol , Sulfa antibiotics, Sulfasalazine, Sulfonamide derivatives, Bacitracin, Ondansetron , Tramadol  hcl, Benadryl  [diphenhydramine  hcl (sleep)], and Shrimp flavor agent (non-screening)    Review of Systems  Cardiovascular:  Positive for chest pain.    Updated Vital Signs BP 110/75   Pulse 81   Temp 98.2 F (36.8 C)   Resp 14   Ht 1.524 m (5')   Wt 84.5 kg   LMP 12/26/2004   SpO2 100%   BMI 36.36 kg/m   Physical Exam Vitals and nursing note reviewed.  Constitutional:      Appearance: She is well-developed. She is not diaphoretic.  HENT:     Head: Normocephalic and atraumatic.     Right Ear: External ear normal.     Left Ear: External ear normal.  Eyes:     General: No scleral icterus.       Right eye: No discharge.        Left eye: No discharge.     Conjunctiva/sclera: Conjunctivae normal.  Neck:     Trachea: No tracheal deviation.  Cardiovascular:     Rate and Rhythm: Normal rate and regular rhythm.  Pulmonary:     Effort: Pulmonary effort is normal. No respiratory distress.     Breath sounds: Normal breath sounds. No stridor. No wheezing or rales.  Abdominal:     General: Bowel sounds are normal. There is no distension.     Palpations: Abdomen is soft.     Tenderness: There is no abdominal tenderness. There is no guarding or rebound.  Musculoskeletal:        General: No tenderness or deformity.     Cervical back: Neck supple.  Skin:    General: Skin is warm and dry.     Findings: No rash.  Neurological:     General: No focal deficit present.     Mental Status: She is alert.     Cranial  Nerves: No cranial nerve deficit, dysarthria or facial asymmetry.     Sensory: No sensory deficit.     Motor: No abnormal muscle tone or seizure activity.     Coordination: Coordination normal.  Psychiatric:        Mood and Affect: Mood normal.     (all labs ordered are listed, but only abnormal results are displayed) Labs Reviewed  BASIC METABOLIC PANEL WITH GFR  CBC  CBG MONITORING, ED  TROPONIN I (HIGH SENSITIVITY)    EKG: EKG Interpretation Date/Time:  Friday April 17 2024 14:23:29 EDT Ventricular Rate:  84 PR Interval:  172 QRS Duration:  102 QT Interval:  384 QTC Calculation: 454 R Axis:   -37  Text Interpretation: Sinus rhythm Left axis deviation Low voltage, precordial leads No significant change since last tracing Confirmed by Randol Simmonds [phone]) on 04/17/2024 2:46:53 PM  Radiology: No results found.   Procedures   Medications Ordered in the ED  morphine  (PF) 4 MG/ML injection 4 mg (has no administration in time range)  nitroGLYCERIN (NITROSTAT) SL tablet 0.4 mg (has no administration in time range)                                    Medical Decision Making Symptoms concerning for exercise-induced angina. ,  Myocardial infarction  Amount and/or Complexity of Data Reviewed Labs: ordered. Radiology: ordered.  Risk Prescription drug management.   Patient presents with severe chest pain associated with a stress test.  Patient reportedly had ST depressions during the procedure.  Patient presents ED with persistent chest pain.  I have ordered nitroglycerin as well as pain medications.  Will plan on laboratory testing including troponins.  Patient will require cardiac consultation.  Labs currently pending at shift change.  Case turned over to Dr. Lynder     Final diagnoses:  Chest pain, unspecified type    ED Discharge Orders     None          Randol Simmonds, MD 04/17/24 1911

## 2024-04-17 NOTE — Consult Note (Signed)
 Cardiology Consultation   Patient ID: Donna Davis MRN: [id number]; DOB: 04-11-1971  Admit date: 04/17/2024 Date of Consult: 04/17/2024  PCP:  [name], MD   [facility] Providers Cardiologist:  None        Patient Profile: Donna Davis is a 53 y.o. female with a hx of chest pain who is being seen 04/17/2024 for the evaluation of chest pain at the request of Dr. Patt.  History of Present Illness:  See below for details.   Past Medical History:  Diagnosis Date   Allergic rhinitis due to allergen 12/19/2016   Anxiety    Anxiety state 09/03/2006   Arthralgia of right temporomandibular joint 08/26/2018   Bilateral deafness 01/17/2016   Cardiac murmur 03/19/2024   Chest pain of uncertain etiology 03/19/2024   Clostridium difficile infection    x several times   Deaf    uses sign language   Elevated LDL cholesterol level 04/07/2024   Family history of breast cancer in mother 08/26/2015   Family history of malignant neoplasm of gastrointestinal tract    Frequent PVCs 03/19/2024   Gallstones    GERD 07/01/2006   Qualifier: Diagnosis of   By: Charmayne MD, Arlyss         Hearing difficulty    b/l, 2/2 congenital rubella s/p stapedectomy. Using hearing aid   Hepatic cyst    6 mm on CT done done in 05/2005   Hyperglycemia 04/07/2024   Hypothyroidism    IBS 07/01/2006   Qualifier: Diagnosis of   By: Charmayne MD, Arlyss         Low back pain 10/09/2022   Migraine 12/02/2012   Obesity    Obesity (BMI 35.0-39.9 without comorbidity) 02/18/2014   On hormone replacement therapy 04/07/2024   Pain in thoracic spine 10/09/2022   Palpitations 03/05/2024   PONV (postoperative nausea and vomiting)    sometimes; depends on how long I'm under   Routine adult health maintenance 09/01/2012   MMG - Normal 2014 - she is due and I reminded her to schedule.   Pelvic - hysterectomy, no cervix.  Sees OBGYN  Flu vaccine seasonally.      DEXA - at 53yo      Screening for colon cancer  10/22/2023   Screening mammogram for breast cancer 04/07/2024    Past Surgical History:  Procedure Laterality Date   ABDOMINAL ADHESION SURGERY     Enterolysis   CESAREAN SECTION  1994   CHOLECYSTECTOMY N/A 06/26/2013   Procedure: LAPAROSCOPIC CHOLECYSTECTOMY WITH ATTEMPTED INTRAOPERATIVE CHOLANGIOGRAM;  Surgeon: Vicenta DELENA Poli, MD;  Location: [facility];  Service: General;  Laterality: N/A;   COCHLEAR IMPLANT  03/30/2016   COCHLEAR IMPLANT Right 03/2015   COLONOSCOPY WITH PROPOFOL   04/23/2012   DIAGNOSTIC LAPAROSCOPY  01/14/2006   I've had several (07/23/2017)   ESOPHAGOGASTRODUODENOSCOPY (EGD) WITH PROPOFOL   04/23/2012   JOINT REPLACEMENT     KNEE SURGERY Right 2014   LAPAROSCOPIC OVARIAN CYSTECTOMY Right 04/16/2000   SALPINGOOPHORECTOMY Left    STAPEDECTOMY Left 05/14/2006   Thyroid  Nodule removed  1997   TONSILLECTOMY     TOTAL ABDOMINAL HYSTERECTOMY  01/13/2004   Endometriosis with residual right ovary. Multiple GYN surgeries for chronic pelvic pain; had LSO in past   TUBAL LIGATION     WOUND EXPLORATION Right 10/16/2016   Procedure: RIGHT EXPLORATION OF LACERATION OF INDEX FINGER;  Surgeon: Franky Curia, MD;  Location: [facility];  Service: Orthopedics;  Laterality: Right;  Home Medications:  Prior to Admission medications   Medication Sig Start Date End Date Taking? Authorizing Provider  Ascorbic Acid (VITAMIN C PO) Take 1 tablet by mouth daily.    [provider]  busPIRone  (BUSPAR ) 10 MG tablet TAKE 2 TABLETS BY MOUTH 3 TIMES A DAY 02/24/24   [name], MD  clonazePAM  (KLONOPIN ) 0.5 MG tablet Take 1 tablet (0.5 mg total) by mouth 2 (two) times daily as needed for anxiety. 03/04/24   Joane Artist RAMAN, MD  estradiol  (ESTRACE ) 1 MG tablet TAKE 1 TABLET BY MOUTH EVERY DAY 04/09/23   [name], MD  fluticasone  (FLONASE ) 50 MCG/ACT nasal spray SPRAY 1 SPRAY INTO EACH NOSTRIL EVERY DAY 04/16/24   [name], MD  levothyroxine  (SYNTHROID ) 25 MCG  tablet TAKE 1 TABLET BY MOUTH EVERY DAY 08/26/23   [name], MD  metoprolol  succinate (TOPROL  XL) 25 MG 24 hr tablet Take 1 tablet (25 mg total) by mouth daily. 04/14/24   Revankar, Jennifer SAUNDERS, MD  pantoprazole  (PROTONIX ) 40 MG tablet TAKE 1 TABLET BY MOUTH EVERY DAY 02/24/24   [name], MD  promethazine  (PHENERGAN ) 12.5 MG tablet Take 1 tablet (12.5 mg total) by mouth every 6 (six) hours as needed for nausea or vomiting. 10/19/22   [name], MD  traMADol  (ULTRAM ) 50 MG tablet TAKE 1 TABLET BY MOUTH EVERY 6 HOURS AS NEEDED FOR SEVERE PAIN 03/03/24   [name], MD    Scheduled Meds:  Continuous Infusions:  PRN Meds: nitroGLYCERIN  Allergies:    Allergies  Allergen Reactions   Onion Anaphylaxis   Other Other (See Comments)    Ossmo Prep - drop in BP Steroids Went crazy   Shellfish Allergy Anaphylaxis and Swelling    *pt denies    Amitriptyline  Anxiety, Palpitations and Other (See Comments)    hallucinations   Amoxicillin -Pot Clavulanate Nausea And Vomiting    With high dose, low dose ok   Amoxicillin -Pot Clavulanate Nausea And Vomiting    With high dose, low dose ok   Cefuroxime Nausea And Vomiting   Cephalexin  Other (See Comments)     gi upset  gi upset   Ciprofloxacin  Other (See Comments)     Fevers   Clarithromycin Nausea And Vomiting and Other (See Comments)    Pt is OK with azithromycin   gi upset   Clarithromycin Nausea And Vomiting    Pt is OK with azithromycin    Doxycycline  Other (See Comments) and Hives     gi upset  gi upset   Propranolol  Other (See Comments)    Fatigue and makes her feel crazy, refuses to take.   Sulfa Antibiotics Other (See Comments)    Rash, vaginal blisters   Sulfasalazine Itching    Rash, vaginal blisters Rash, vaginal blisters    Sulfonamide Derivatives Other (See Comments)    vaginal blisters   Bacitracin Hives    Redness, pain at site of application   Ondansetron  Other (See Comments)    CAUSES SEVERE MIGRAINES  Other  reaction(s): Headache  CAUSES SEVERE MIGRAINES  CAUSES SEVERE MIGRAINES   Tramadol  Hcl Other (See Comments)    Other reaction(s): OTHER  Other reaction(s): Other (See Comments)  Other reaction(s): OTHER   Benadryl  [Diphenhydramine  Hcl (Sleep)] Anxiety    Extreme agitation   Shrimp Flavor Agent (Non-Screening) Other (See Comments)    unknown    Social History:   Social History   Socioeconomic History   Marital status: Married    Spouse name: Not  on file   Number of children: 2   Years of education: Not on file   Highest education level: Not on file  Occupational History   Occupation: Immunologist    Comment: works in [place]   Tobacco Use   Smoking status: Former    Current packs/day: 0.00    Average packs/day: 1 pack/day for 26.0 years (26.0 ttl pk-yrs)    Types: Cigarettes    Start date: 02/17/1981    Quit date: 02/18/2007    Years since quitting: 17.1   Smokeless tobacco: Never   Tobacco comments:    quit 2008  Vaping Use   Vaping status: Never Used  Substance and Sexual Activity   Alcohol use: Yes    Comment: Very Rarely   Drug use: No   Sexual activity: Not Currently    Partners: Male    Birth control/protection: Surgical  Other Topics Concern   Not on file  Social History Narrative   Current smoker, not using alcohol or drugs at this timeLives with husband and 2 children      Are you right handed or left handed? Right Handed    Are you currently employed ? 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 No    Who lives with you? Husband and 85 pound dog    What type of home do you live in: 1 story or 2 story? Lives in a one story home        Social Drivers of Health   Financial Resource Strain: Not on file  Food Insecurity: Low Risk  (06/27/2023)   Received from [facility]   Hunger Vital Sign    Within the past 12 months, you worried that your food would run out before you got money to buy more:  Never true    Within the past 12 months, the food you bought just didn't last and you didn't have money to get more. : Never true  Transportation Needs: No Transportation Needs (06/27/2023)   Received from [facility]    In the past 12 months, has lack of reliable transportation kept you from medical appointments, meetings, work or from getting things needed for daily living? : No  Physical Activity: Not on file  Stress: Not on file  Social Connections: Not on file  Intimate Partner Violence: Not At Risk (01/01/2023)   Humiliation, Afraid, Rape, and Kick questionnaire    Fear of Current or Ex-Partner: No    Emotionally Abused: No    Physically Abused: No    Sexually Abused: No    Family History:    Family History  Problem Relation Age of Onset   Breast cancer Mother    Ovarian cancer Mother    Stroke Father    Neuropathy Father    Diabetes Father    Heart disease Father        heart attack   Irritable bowel syndrome Father    Kidney disease Father    Stomach cancer Maternal Grandmother    Colon cancer Maternal Grandmother    Diabetes Maternal Grandmother    Bipolar disorder Daughter    Heart disease Other        both grandmothers   Esophageal cancer Neg Hx    Liver disease Neg Hx      ROS:  Please see the history of present illness.   All other ROS reviewed and negative.  Physical Exam/Data: Vitals:   04/17/24 1420 04/17/24 1425 04/17/24 1445 04/17/24 1645  BP:  114/78 110/75 119/84  Pulse:  84 81 72  Resp:  15 14 20   Temp:  98.2 F (36.8 C)    SpO2:  100% 100% 100%  Weight: 84.5 kg     Height: 5' (1.524 m)      No intake or output data in the 24 hours ending 04/17/24 1835    04/17/2024    2:20 PM 04/14/2024    1:21 PM 04/07/2024    9:23 AM  Last 3 Weights  Weight (lbs) 186 lb 3.2 oz 186 lb 3.2 oz 187 lb 12.8 oz  Weight (kg) 84.46 kg 84.46 kg 85.186 kg     Body mass index is 36.36 kg/m.  General:  Well nourished, well developed, in  no acute distress HEENT: normal, deafness Neck: no JVD Vascular: No carotid bruits; Distal pulses 2+ bilaterally Cardiac:  normal S1, S2; RRR; no murmur  Lungs:  clear to auscultation bilaterally, no wheezing, rhonchi or rales  Abd: soft, nontender, no hepatomegaly  Ext: no edema Musculoskeletal:  No deformities, BUE and BLE strength normal and equal Skin: warm and dry  Neuro:  CNs 2-12 intact, no focal abnormalities noted Psych:  Normal affect   EKG:  The EKG was personally reviewed and demonstrates: Sinus rhythm with no ischemic changes Telemetry:  Telemetry was personally reviewed and demonstrates: No adverse arrhythmias  Relevant CV Studies: See below, stress echo  Laboratory Data: High Sensitivity Troponin:   Recent Labs  Lab 04/17/24 1629  TROPONINIHS 4     Chemistry Recent Labs  Lab 04/17/24 1629  NA 139  K 3.9  CL 105  CO2 24  GLUCOSE 87  BUN 10  CREATININE 0.68  CALCIUM 9.4  GFRNONAA >60  ANIONGAP 10    No results for input(s): PROT, ALBUMIN, AST, ALT, ALKPHOS, BILITOT in the last 168 hours. Lipids No results for input(s): CHOL, TRIG, HDL, LABVLDL, LDLCALC, CHOLHDL in the last 168 hours.  Hematology Recent Labs  Lab 04/17/24 1629  WBC 10.1  RBC 4.48  HGB 13.9  HCT 41.5  MCV 92.6  MCH 31.0  MCHC 33.5  RDW 11.5  PLT 281   Thyroid  No results for input(s): TSH, FREET4 in the last 168 hours.  BNPNo results for input(s): BNP, PROBNP in the last 168 hours.  DDimer No results for input(s): DDIMER in the last 168 hours.  Radiology/Studies:  ECHOCARDIOGRAM STRESS TEST Result Date: 04/17/2024     EXERCISE STRESS ECHO REPORT    -------------------------------------------------------------------------------- Patient Name:   PASTY MANNINEN  Date of Exam: 04/17/2024 Medical Rec #:  [id number]     Height:       60.0 in Accession #:    [id number]    Weight:       186.2 lb Date of Birth:  02/09/1971      BSA:          1.811 m Patient  Age:    53 years      BP:           131/86 mmHg Patient Gender: F             HR:           83 bpm. Exam Location:  [address] Procedure: Stress Echo and Intracardiac Opacification Agent Indications:    R07.9 Chest pain  History:        Patient has prior history of Echocardiogram examinations,  most                 recent 03/27/2024.  Sonographer:    Elsie Bohr RDCS Referring Phys: CYRUS JENNIFER SAUNDERS [facility]  Sonographer Comments: Technically difficult study due to poor echo windows. IMPRESSIONS  1. This is a negative stress echocardiogram for ischemia.  2. This is a low risk study.  3. Grossly normal wall motion with stress however image quality is reduced due to suboptimal acoustic windows for imaging. If symptoms persist, consider alternate imaging modality for stress. FINDINGS Exam Protocol: The patient exercised on a treadmill according to a Bruce protocol. Definity contrast agent was given IV to delineate the left ventricular endocardial borders.  Patient Performance: The patient exercised for 7 minutes achieving 8 METS. The maximum stage achieved was III of the Bruce protocol. The baseline heart rate was 83 bpm. The heart rate at peak stress was 166 bpm. The target heart rate was calculated to be  140 bpm. The percentage of maximum predicted heart rate achieved was 100.8 %. The baseline blood pressure was 131/86 mmHg. The blood pressure at peak stress was 192/72 mmHg. The blood pressure response was normal. The patient developed chest pain and nausea during the stress exam. The symptoms did not resolve. The patient's functional capacity was average.  EKG: Resting EKG showed normal sinus rhythm with no abnormal findings. The patient developed upsloping ST depressions and beat to beat ST depressions, nonspecific for ischemia during exercise.  2D Echo Findings: The baseline ejection fraction was 65%. The peak ejection fraction at stress was 70%. Baseline regional wall motion abnormalities were not present.  There were no stress-induced wall motion abnormalities. This is a negative stress echocardiogram for ischemia.  Soyla Merck MD Electronically signed on 04/17/2024 at 5:07:32 PM    Final    DG Chest Portable 1 View Result Date: 04/17/2024 CLINICAL DATA:  Chest pain EXAM: PORTABLE CHEST - 1 VIEW COMPARISON:  March 03, 2024 FINDINGS: No focal airspace consolidation, pleural effusion, or pneumothorax. No cardiomegaly. No acute fracture or destructive lesions. Multilevel thoracic osteophytosis. Mild levocurvature of the thoracolumbar spine. Metallic structure overlying the left mid lung, likely external to the patient. Cholecystectomy clips. IMPRESSION: 1. No acute cardiopulmonary abnormality. 2. Metallic rounded structure overlying the left mid lung, likely external to the patient. Correlation with physical exam findings requested. Electronically Signed   By: Rogelia Myers M.D.   On: 04/17/2024 17:01     Assessment and Plan:  53 year old with intermittent heavy chest pressure over the last several weeks who came into the office today for a stress echocardiogram and developed some nonspecific ST changes on her stress EKG and perhaps small septal changes on echocardiogram.  Intermediate level test.  She did have chest discomfort that continued for several minutes following the test and required EMS transportation to the emergency department for further evaluation.  She is deaf.  She required an interpreter for hand signals.  Thankfully in the emergency department, her first troponin is normal.  Her EKG also was nonischemic at this time.  She is currently wearing a Zio monitor for the second time.  She occasionally have symptoms of palpitations as well.  She has not had any syncopal episodes during monitoring.  Chest discomfort, abnormal stress echo - We will go ahead and get her set up for a left heart catheterization, possible PCI as an outpatient.  Our scheduling team will call her for scheduling.  She  did state that her family is going on  vacation next week. -In the meantime, we will give her isosorbide 30 mg once a day -Chest x-ray here unremarkable. -I discussed risks and benefit including stroke heart attack death renal impairment bleeding.  She is willing to proceed.  I think that it makes sense for us  to proceed with heart catheterization to get a definitive diagnosis for her.  She is very concerned.  If catheterization is unremarkable, thankfully we can confirm that this would be noncardiac in etiology. -Awaiting results of ZIO monitor.  We will be in touch with her regarding timing for cardiac catheterization.  She was very thankful.  Family at bedside.   For questions or updates, please contact [facility] Please consult [email or web address] for contact info under    Signed, Oneil Parchment, MD  04/17/2024 6:35 PM

## 2024-04-17 NOTE — Discharge Instructions (Signed)
 As we discussed, your heart enzyme test is normal today.  Cardiology office will call you next week to schedule a follow-up and likely you will need follow-up testing  Cardiologist recommend Imdur 30 mg daily  Return to ER if you have worse chest pain or shortness of breath

## 2024-04-17 NOTE — ED Notes (Signed)
 Got patient into a gown on the monitor di EKG patient is resting with call bell in reach and family at bedside

## 2024-04-17 NOTE — H&P (View-Only) (Signed)
 Cardiology Consultation   Patient ID: Donna Davis MRN: [id number]; DOB: 04-11-1971  Admit date: 04/17/2024 Date of Consult: 04/17/2024  PCP:  [name], MD   [facility] Providers Cardiologist:  None        Patient Profile: Donna Davis is a 53 y.o. female with a hx of chest pain who is being seen 04/17/2024 for the evaluation of chest pain at the request of Dr. Patt.  History of Present Illness:  See below for details.   Past Medical History:  Diagnosis Date   Allergic rhinitis due to allergen 12/19/2016   Anxiety    Anxiety state 09/03/2006   Arthralgia of right temporomandibular joint 08/26/2018   Bilateral deafness 01/17/2016   Cardiac murmur 03/19/2024   Chest pain of uncertain etiology 03/19/2024   Clostridium difficile infection    x several times   Deaf    uses sign language   Elevated LDL cholesterol level 04/07/2024   Family history of breast cancer in mother 08/26/2015   Family history of malignant neoplasm of gastrointestinal tract    Frequent PVCs 03/19/2024   Gallstones    GERD 07/01/2006   Qualifier: Diagnosis of   By: Charmayne MD, Arlyss         Hearing difficulty    b/l, 2/2 congenital rubella s/p stapedectomy. Using hearing aid   Hepatic cyst    6 mm on CT done done in 05/2005   Hyperglycemia 04/07/2024   Hypothyroidism    IBS 07/01/2006   Qualifier: Diagnosis of   By: Charmayne MD, Arlyss         Low back pain 10/09/2022   Migraine 12/02/2012   Obesity    Obesity (BMI 35.0-39.9 without comorbidity) 02/18/2014   On hormone replacement therapy 04/07/2024   Pain in thoracic spine 10/09/2022   Palpitations 03/05/2024   PONV (postoperative nausea and vomiting)    sometimes; depends on how long I'm under   Routine adult health maintenance 09/01/2012   MMG - Normal 2014 - she is due and I reminded her to schedule.   Pelvic - hysterectomy, no cervix.  Sees OBGYN  Flu vaccine seasonally.      DEXA - at 53yo      Screening for colon cancer  10/22/2023   Screening mammogram for breast cancer 04/07/2024    Past Surgical History:  Procedure Laterality Date   ABDOMINAL ADHESION SURGERY     Enterolysis   CESAREAN SECTION  1994   CHOLECYSTECTOMY N/A 06/26/2013   Procedure: LAPAROSCOPIC CHOLECYSTECTOMY WITH ATTEMPTED INTRAOPERATIVE CHOLANGIOGRAM;  Surgeon: Vicenta DELENA Poli, MD;  Location: [facility];  Service: General;  Laterality: N/A;   COCHLEAR IMPLANT  03/30/2016   COCHLEAR IMPLANT Right 03/2015   COLONOSCOPY WITH PROPOFOL   04/23/2012   DIAGNOSTIC LAPAROSCOPY  01/14/2006   I've had several (07/23/2017)   ESOPHAGOGASTRODUODENOSCOPY (EGD) WITH PROPOFOL   04/23/2012   JOINT REPLACEMENT     KNEE SURGERY Right 2014   LAPAROSCOPIC OVARIAN CYSTECTOMY Right 04/16/2000   SALPINGOOPHORECTOMY Left    STAPEDECTOMY Left 05/14/2006   Thyroid  Nodule removed  1997   TONSILLECTOMY     TOTAL ABDOMINAL HYSTERECTOMY  01/13/2004   Endometriosis with residual right ovary. Multiple GYN surgeries for chronic pelvic pain; had LSO in past   TUBAL LIGATION     WOUND EXPLORATION Right 10/16/2016   Procedure: RIGHT EXPLORATION OF LACERATION OF INDEX FINGER;  Surgeon: Franky Curia, MD;  Location: [facility];  Service: Orthopedics;  Laterality: Right;  Home Medications:  Prior to Admission medications   Medication Sig Start Date End Date Taking? Authorizing Provider  Ascorbic Acid (VITAMIN C PO) Take 1 tablet by mouth daily.    [provider]  busPIRone  (BUSPAR ) 10 MG tablet TAKE 2 TABLETS BY MOUTH 3 TIMES A DAY 02/24/24   [name], MD  clonazePAM  (KLONOPIN ) 0.5 MG tablet Take 1 tablet (0.5 mg total) by mouth 2 (two) times daily as needed for anxiety. 03/04/24   Joane Artist RAMAN, MD  estradiol  (ESTRACE ) 1 MG tablet TAKE 1 TABLET BY MOUTH EVERY DAY 04/09/23   [name], MD  fluticasone  (FLONASE ) 50 MCG/ACT nasal spray SPRAY 1 SPRAY INTO EACH NOSTRIL EVERY DAY 04/16/24   [name], MD  levothyroxine  (SYNTHROID ) 25 MCG  tablet TAKE 1 TABLET BY MOUTH EVERY DAY 08/26/23   [name], MD  metoprolol  succinate (TOPROL  XL) 25 MG 24 hr tablet Take 1 tablet (25 mg total) by mouth daily. 04/14/24   Revankar, Jennifer SAUNDERS, MD  pantoprazole  (PROTONIX ) 40 MG tablet TAKE 1 TABLET BY MOUTH EVERY DAY 02/24/24   [name], MD  promethazine  (PHENERGAN ) 12.5 MG tablet Take 1 tablet (12.5 mg total) by mouth every 6 (six) hours as needed for nausea or vomiting. 10/19/22   [name], MD  traMADol  (ULTRAM ) 50 MG tablet TAKE 1 TABLET BY MOUTH EVERY 6 HOURS AS NEEDED FOR SEVERE PAIN 03/03/24   [name], MD    Scheduled Meds:  Continuous Infusions:  PRN Meds: nitroGLYCERIN  Allergies:    Allergies  Allergen Reactions   Onion Anaphylaxis   Other Other (See Comments)    Ossmo Prep - drop in BP Steroids Went crazy   Shellfish Allergy Anaphylaxis and Swelling    *pt denies    Amitriptyline  Anxiety, Palpitations and Other (See Comments)    hallucinations   Amoxicillin -Pot Clavulanate Nausea And Vomiting    With high dose, low dose ok   Amoxicillin -Pot Clavulanate Nausea And Vomiting    With high dose, low dose ok   Cefuroxime Nausea And Vomiting   Cephalexin  Other (See Comments)     gi upset  gi upset   Ciprofloxacin  Other (See Comments)     Fevers   Clarithromycin Nausea And Vomiting and Other (See Comments)    Pt is OK with azithromycin   gi upset   Clarithromycin Nausea And Vomiting    Pt is OK with azithromycin    Doxycycline  Other (See Comments) and Hives     gi upset  gi upset   Propranolol  Other (See Comments)    Fatigue and makes her feel crazy, refuses to take.   Sulfa Antibiotics Other (See Comments)    Rash, vaginal blisters   Sulfasalazine Itching    Rash, vaginal blisters Rash, vaginal blisters    Sulfonamide Derivatives Other (See Comments)    vaginal blisters   Bacitracin Hives    Redness, pain at site of application   Ondansetron  Other (See Comments)    CAUSES SEVERE MIGRAINES  Other  reaction(s): Headache  CAUSES SEVERE MIGRAINES  CAUSES SEVERE MIGRAINES   Tramadol  Hcl Other (See Comments)    Other reaction(s): OTHER  Other reaction(s): Other (See Comments)  Other reaction(s): OTHER   Benadryl  [Diphenhydramine  Hcl (Sleep)] Anxiety    Extreme agitation   Shrimp Flavor Agent (Non-Screening) Other (See Comments)    unknown    Social History:   Social History   Socioeconomic History   Marital status: Married    Spouse name: Not  on file   Number of children: 2   Years of education: Not on file   Highest education level: Not on file  Occupational History   Occupation: Immunologist    Comment: works in [place]   Tobacco Use   Smoking status: Former    Current packs/day: 0.00    Average packs/day: 1 pack/day for 26.0 years (26.0 ttl pk-yrs)    Types: Cigarettes    Start date: 02/17/1981    Quit date: 02/18/2007    Years since quitting: 17.1   Smokeless tobacco: Never   Tobacco comments:    quit 2008  Vaping Use   Vaping status: Never Used  Substance and Sexual Activity   Alcohol use: Yes    Comment: Very Rarely   Drug use: No   Sexual activity: Not Currently    Partners: Male    Birth control/protection: Surgical  Other Topics Concern   Not on file  Social History Narrative   Current smoker, not using alcohol or drugs at this timeLives with husband and 2 children      Are you right handed or left handed? Right Handed    Are you currently employed ? 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 No    Who lives with you? Husband and 85 pound dog    What type of home do you live in: 1 story or 2 story? Lives in a one story home        Social Drivers of Health   Financial Resource Strain: Not on file  Food Insecurity: Low Risk  (06/27/2023)   Received from [facility]   Hunger Vital Sign    Within the past 12 months, you worried that your food would run out before you got money to buy more:  Never true    Within the past 12 months, the food you bought just didn't last and you didn't have money to get more. : Never true  Transportation Needs: No Transportation Needs (06/27/2023)   Received from [facility]    In the past 12 months, has lack of reliable transportation kept you from medical appointments, meetings, work or from getting things needed for daily living? : No  Physical Activity: Not on file  Stress: Not on file  Social Connections: Not on file  Intimate Partner Violence: Not At Risk (01/01/2023)   Humiliation, Afraid, Rape, and Kick questionnaire    Fear of Current or Ex-Partner: No    Emotionally Abused: No    Physically Abused: No    Sexually Abused: No    Family History:    Family History  Problem Relation Age of Onset   Breast cancer Mother    Ovarian cancer Mother    Stroke Father    Neuropathy Father    Diabetes Father    Heart disease Father        heart attack   Irritable bowel syndrome Father    Kidney disease Father    Stomach cancer Maternal Grandmother    Colon cancer Maternal Grandmother    Diabetes Maternal Grandmother    Bipolar disorder Daughter    Heart disease Other        both grandmothers   Esophageal cancer Neg Hx    Liver disease Neg Hx      ROS:  Please see the history of present illness.   All other ROS reviewed and negative.  Physical Exam/Data: Vitals:   04/17/24 1420 04/17/24 1425 04/17/24 1445 04/17/24 1645  BP:  114/78 110/75 119/84  Pulse:  84 81 72  Resp:  15 14 20   Temp:  98.2 F (36.8 C)    SpO2:  100% 100% 100%  Weight: 84.5 kg     Height: 5' (1.524 m)      No intake or output data in the 24 hours ending 04/17/24 1835    04/17/2024    2:20 PM 04/14/2024    1:21 PM 04/07/2024    9:23 AM  Last 3 Weights  Weight (lbs) 186 lb 3.2 oz 186 lb 3.2 oz 187 lb 12.8 oz  Weight (kg) 84.46 kg 84.46 kg 85.186 kg     Body mass index is 36.36 kg/m.  General:  Well nourished, well developed, in  no acute distress HEENT: normal, deafness Neck: no JVD Vascular: No carotid bruits; Distal pulses 2+ bilaterally Cardiac:  normal S1, S2; RRR; no murmur  Lungs:  clear to auscultation bilaterally, no wheezing, rhonchi or rales  Abd: soft, nontender, no hepatomegaly  Ext: no edema Musculoskeletal:  No deformities, BUE and BLE strength normal and equal Skin: warm and dry  Neuro:  CNs 2-12 intact, no focal abnormalities noted Psych:  Normal affect   EKG:  The EKG was personally reviewed and demonstrates: Sinus rhythm with no ischemic changes Telemetry:  Telemetry was personally reviewed and demonstrates: No adverse arrhythmias  Relevant CV Studies: See below, stress echo  Laboratory Data: High Sensitivity Troponin:   Recent Labs  Lab 04/17/24 1629  TROPONINIHS 4     Chemistry Recent Labs  Lab 04/17/24 1629  NA 139  K 3.9  CL 105  CO2 24  GLUCOSE 87  BUN 10  CREATININE 0.68  CALCIUM 9.4  GFRNONAA >60  ANIONGAP 10    No results for input(s): PROT, ALBUMIN, AST, ALT, ALKPHOS, BILITOT in the last 168 hours. Lipids No results for input(s): CHOL, TRIG, HDL, LABVLDL, LDLCALC, CHOLHDL in the last 168 hours.  Hematology Recent Labs  Lab 04/17/24 1629  WBC 10.1  RBC 4.48  HGB 13.9  HCT 41.5  MCV 92.6  MCH 31.0  MCHC 33.5  RDW 11.5  PLT 281   Thyroid  No results for input(s): TSH, FREET4 in the last 168 hours.  BNPNo results for input(s): BNP, PROBNP in the last 168 hours.  DDimer No results for input(s): DDIMER in the last 168 hours.  Radiology/Studies:  ECHOCARDIOGRAM STRESS TEST Result Date: 04/17/2024     EXERCISE STRESS ECHO REPORT    -------------------------------------------------------------------------------- Patient Name:   Donna Davis  Date of Exam: 04/17/2024 Medical Rec #:  [id number]     Height:       60.0 in Accession #:    [id number]    Weight:       186.2 lb Date of Birth:  02/09/1971      BSA:          1.811 m Patient  Age:    53 years      BP:           131/86 mmHg Patient Gender: F             HR:           83 bpm. Exam Location:  [address] Procedure: Stress Echo and Intracardiac Opacification Agent Indications:    R07.9 Chest pain  History:        Patient has prior history of Echocardiogram examinations,  most                 recent 03/27/2024.  Sonographer:    Elsie Bohr RDCS Referring Phys: CYRUS JENNIFER SAUNDERS [facility]  Sonographer Comments: Technically difficult study due to poor echo windows. IMPRESSIONS  1. This is a negative stress echocardiogram for ischemia.  2. This is a low risk study.  3. Grossly normal wall motion with stress however image quality is reduced due to suboptimal acoustic windows for imaging. If symptoms persist, consider alternate imaging modality for stress. FINDINGS Exam Protocol: The patient exercised on a treadmill according to a Bruce protocol. Definity contrast agent was given IV to delineate the left ventricular endocardial borders.  Patient Performance: The patient exercised for 7 minutes achieving 8 METS. The maximum stage achieved was III of the Bruce protocol. The baseline heart rate was 83 bpm. The heart rate at peak stress was 166 bpm. The target heart rate was calculated to be  140 bpm. The percentage of maximum predicted heart rate achieved was 100.8 %. The baseline blood pressure was 131/86 mmHg. The blood pressure at peak stress was 192/72 mmHg. The blood pressure response was normal. The patient developed chest pain and nausea during the stress exam. The symptoms did not resolve. The patient's functional capacity was average.  EKG: Resting EKG showed normal sinus rhythm with no abnormal findings. The patient developed upsloping ST depressions and beat to beat ST depressions, nonspecific for ischemia during exercise.  2D Echo Findings: The baseline ejection fraction was 65%. The peak ejection fraction at stress was 70%. Baseline regional wall motion abnormalities were not present.  There were no stress-induced wall motion abnormalities. This is a negative stress echocardiogram for ischemia.  Soyla Merck MD Electronically signed on 04/17/2024 at 5:07:32 PM    Final    DG Chest Portable 1 View Result Date: 04/17/2024 CLINICAL DATA:  Chest pain EXAM: PORTABLE CHEST - 1 VIEW COMPARISON:  March 03, 2024 FINDINGS: No focal airspace consolidation, pleural effusion, or pneumothorax. No cardiomegaly. No acute fracture or destructive lesions. Multilevel thoracic osteophytosis. Mild levocurvature of the thoracolumbar spine. Metallic structure overlying the left mid lung, likely external to the patient. Cholecystectomy clips. IMPRESSION: 1. No acute cardiopulmonary abnormality. 2. Metallic rounded structure overlying the left mid lung, likely external to the patient. Correlation with physical exam findings requested. Electronically Signed   By: Rogelia Myers M.D.   On: 04/17/2024 17:01     Assessment and Plan:  53 year old with intermittent heavy chest pressure over the last several weeks who came into the office today for a stress echocardiogram and developed some nonspecific ST changes on her stress EKG and perhaps small septal changes on echocardiogram.  Intermediate level test.  She did have chest discomfort that continued for several minutes following the test and required EMS transportation to the emergency department for further evaluation.  She is deaf.  She required an interpreter for hand signals.  Thankfully in the emergency department, her first troponin is normal.  Her EKG also was nonischemic at this time.  She is currently wearing a Zio monitor for the second time.  She occasionally have symptoms of palpitations as well.  She has not had any syncopal episodes during monitoring.  Chest discomfort, abnormal stress echo - We will go ahead and get her set up for a left heart catheterization, possible PCI as an outpatient.  Our scheduling team will call her for scheduling.  She  did state that her family is going on  vacation next week. -In the meantime, we will give her isosorbide 30 mg once a day -Chest x-ray here unremarkable. -I discussed risks and benefit including stroke heart attack death renal impairment bleeding.  She is willing to proceed.  I think that it makes sense for us  to proceed with heart catheterization to get a definitive diagnosis for her.  She is very concerned.  If catheterization is unremarkable, thankfully we can confirm that this would be noncardiac in etiology. -Awaiting results of ZIO monitor.  We will be in touch with her regarding timing for cardiac catheterization.  She was very thankful.  Family at bedside.   For questions or updates, please contact [facility] Please consult [email or web address] for contact info under    Signed, Oneil Parchment, MD  04/17/2024 6:35 PM

## 2024-04-17 NOTE — ED Triage Notes (Signed)
 Patient arrives via [facility] for chest pain after stress test today [facility]. Patient was given 0.4 nitroglycerin and 324 ASA en route. Patient alert and oriented x4. Sudden onset CP and left arm pain with associated nausea. ECG NSR.    BP 148/100 initially now 136/90.  HR 80 98 on room air

## 2024-04-17 NOTE — ED Notes (Signed)
 CCMD

## 2024-04-19 NOTE — Pre-Procedure Instructions (Signed)
 Had a message that patient was seen in ER on Friday and needed outpatient cath.  Spoke with patient, she would like to get cath done as soon as possible.  She will require an in person sign interpreter, we will request in the morning, she will also call the interpreter to notify them to be expecting our request.   Provided instructions for her cath procedure.  Explained she can have clear liquids after midnight until she leaves for the hospital.   She can take her morning medication before procedure.  Her husband will be driving her home and staying overnight with her.

## 2024-04-20 ENCOUNTER — Other Ambulatory Visit: Payer: Self-pay

## 2024-04-20 ENCOUNTER — Encounter (HOSPITAL_COMMUNITY)
Admission: AD | Disposition: A | Discharge status: Home / Self Care | Payer: Self-pay | Source: Ambulatory Visit | Attending: Internal Medicine

## 2024-04-20 ENCOUNTER — Inpatient Hospital Stay (HOSPITAL_COMMUNITY)
Admission: AD | Admit: 2024-04-20 | Discharge: 2024-04-23 | DRG: 321 | Disposition: A | Discharge status: Home / Self Care | Source: Ambulatory Visit | Attending: Cardiovascular Disease | Admitting: Cardiovascular Disease

## 2024-04-20 ENCOUNTER — Encounter (HOSPITAL_COMMUNITY): Payer: Self-pay | Admitting: Internal Medicine

## 2024-04-20 ENCOUNTER — Other Ambulatory Visit

## 2024-04-20 DIAGNOSIS — E66812 Obesity, class 2: Secondary | ICD-10-CM | POA: Diagnosis present

## 2024-04-20 DIAGNOSIS — Z79890 Hormone replacement therapy (postmenopausal): Secondary | ICD-10-CM

## 2024-04-20 DIAGNOSIS — Z8249 Family history of ischemic heart disease and other diseases of the circulatory system: Secondary | ICD-10-CM | POA: Diagnosis not present

## 2024-04-20 DIAGNOSIS — Z833 Family history of diabetes mellitus: Secondary | ICD-10-CM

## 2024-04-20 DIAGNOSIS — Z90710 Acquired absence of both cervix and uterus: Secondary | ICD-10-CM | POA: Diagnosis not present

## 2024-04-20 DIAGNOSIS — I493 Ventricular premature depolarization: Secondary | ICD-10-CM | POA: Diagnosis not present

## 2024-04-20 DIAGNOSIS — I25110 Atherosclerotic heart disease of native coronary artery with unstable angina pectoris: Principal | ICD-10-CM | POA: Diagnosis present

## 2024-04-20 DIAGNOSIS — E785 Hyperlipidemia, unspecified: Secondary | ICD-10-CM | POA: Diagnosis present

## 2024-04-20 DIAGNOSIS — I2510 Atherosclerotic heart disease of native coronary artery without angina pectoris: Secondary | ICD-10-CM | POA: Diagnosis not present

## 2024-04-20 DIAGNOSIS — Z841 Family history of disorders of kidney and ureter: Secondary | ICD-10-CM

## 2024-04-20 DIAGNOSIS — H9193 Unspecified hearing loss, bilateral: Secondary | ICD-10-CM | POA: Diagnosis present

## 2024-04-20 DIAGNOSIS — Z79899 Other long term (current) drug therapy: Secondary | ICD-10-CM | POA: Diagnosis not present

## 2024-04-20 DIAGNOSIS — I200 Unstable angina: Secondary | ICD-10-CM | POA: Diagnosis present

## 2024-04-20 DIAGNOSIS — Z803 Family history of malignant neoplasm of breast: Secondary | ICD-10-CM | POA: Diagnosis not present

## 2024-04-20 DIAGNOSIS — Z800 Family history of malignant neoplasm of digestive organs: Secondary | ICD-10-CM | POA: Diagnosis not present

## 2024-04-20 DIAGNOSIS — Z6836 Body mass index (BMI) 36.0-36.9, adult: Secondary | ICD-10-CM

## 2024-04-20 DIAGNOSIS — G43909 Migraine, unspecified, not intractable, without status migrainosus: Secondary | ICD-10-CM | POA: Diagnosis present

## 2024-04-20 DIAGNOSIS — Z7902 Long term (current) use of antithrombotics/antiplatelets: Secondary | ICD-10-CM | POA: Diagnosis not present

## 2024-04-20 DIAGNOSIS — E039 Hypothyroidism, unspecified: Secondary | ICD-10-CM | POA: Diagnosis present

## 2024-04-20 DIAGNOSIS — F419 Anxiety disorder, unspecified: Secondary | ICD-10-CM | POA: Diagnosis present

## 2024-04-20 DIAGNOSIS — Z881 Allergy status to other antibiotic agents status: Secondary | ICD-10-CM

## 2024-04-20 DIAGNOSIS — E669 Obesity, unspecified: Secondary | ICD-10-CM | POA: Diagnosis not present

## 2024-04-20 DIAGNOSIS — Z87891 Personal history of nicotine dependence: Secondary | ICD-10-CM | POA: Diagnosis not present

## 2024-04-20 DIAGNOSIS — K219 Gastro-esophageal reflux disease without esophagitis: Secondary | ICD-10-CM | POA: Diagnosis present

## 2024-04-20 DIAGNOSIS — Z823 Family history of stroke: Secondary | ICD-10-CM

## 2024-04-20 DIAGNOSIS — Z91013 Allergy to seafood: Secondary | ICD-10-CM | POA: Diagnosis not present

## 2024-04-20 DIAGNOSIS — Z8041 Family history of malignant neoplasm of ovary: Secondary | ICD-10-CM

## 2024-04-20 DIAGNOSIS — Z7982 Long term (current) use of aspirin: Secondary | ICD-10-CM

## 2024-04-20 DIAGNOSIS — I10 Essential (primary) hypertension: Secondary | ICD-10-CM | POA: Diagnosis present

## 2024-04-20 HISTORY — PX: LEFT HEART CATH AND CORONARY ANGIOGRAPHY: CATH118249

## 2024-04-20 HISTORY — PX: CORONARY PRESSURE/FFR WITH 3D MAPPING: CATH118309

## 2024-04-20 LAB — TROPONIN I (HIGH SENSITIVITY)
Troponin I (High Sensitivity): 5 ng/L (ref <18)
Troponin I (High Sensitivity): 5 ng/L (ref <18)

## 2024-04-20 LAB — GLUCOSE, CAPILLARY
Glucose-Capillary: 88 mg/dL (ref 70–99)
Glucose-Capillary: 105 mg/dL — ABNORMAL HIGH (ref 70–99)

## 2024-04-20 SURGERY — LEFT HEART CATH AND CORONARY ANGIOGRAPHY
Anesthesia: LOCAL

## 2024-04-20 MED ORDER — PANTOPRAZOLE SODIUM 40 MG PO TBEC
40.0000 mg | DELAYED_RELEASE_TABLET | Freq: Every day | ORAL | Status: DC
  Administered 2024-04-20 – 2024-04-23 (×4): 40 mg via ORAL
  Filled 2024-04-20 (×4): qty 1

## 2024-04-20 MED ORDER — SODIUM CHLORIDE 0.9% FLUSH
3.0000 mL | Freq: Two times a day (BID) | INTRAVENOUS | Status: DC
  Administered 2024-04-20 – 2024-04-23 (×6): 3 mL via INTRAVENOUS

## 2024-04-20 MED ORDER — MIDAZOLAM HCL 2 MG/2ML IJ SOLN
INTRAMUSCULAR | Status: DC | PRN
  Administered 2024-04-20 (×3): 1 mg via INTRAVENOUS

## 2024-04-20 MED ORDER — HYDRALAZINE HCL 20 MG/ML IJ SOLN: 10.0000 mg | INTRAMUSCULAR | Status: AC | PRN

## 2024-04-20 MED ORDER — HEPARIN SODIUM (PORCINE) 1000 UNIT/ML IJ SOLN
INTRAMUSCULAR | Status: DC | PRN
  Administered 2024-04-20: 4500 [IU] via INTRAVENOUS

## 2024-04-20 MED ORDER — MIDAZOLAM HCL 2 MG/2ML IJ SOLN
INTRAMUSCULAR | Status: AC
  Filled 2024-04-20: qty 2

## 2024-04-20 MED ORDER — NITROGLYCERIN 0.4 MG SL SUBL: 0.4000 mg | SUBLINGUAL_TABLET | SUBLINGUAL | Status: DC | PRN

## 2024-04-20 MED ORDER — PROMETHAZINE HCL 25 MG PO TABS: 12.5000 mg | ORAL_TABLET | Freq: Four times a day (QID) | ORAL | Status: DC | PRN

## 2024-04-20 MED ORDER — FENTANYL CITRATE (PF) 100 MCG/2ML IJ SOLN
INTRAMUSCULAR | Status: DC | PRN
  Administered 2024-04-20: 50 ug via INTRAVENOUS
  Administered 2024-04-20: 25 ug via INTRAVENOUS

## 2024-04-20 MED ORDER — MORPHINE SULFATE (PF) 2 MG/ML IV SOLN
INTRAVENOUS | Status: AC
Start: 2024-04-20 — End: 2024-04-21
  Filled 2024-04-20: qty 1

## 2024-04-20 MED ORDER — HEPARIN SODIUM (PORCINE) 1000 UNIT/ML IJ SOLN
INTRAMUSCULAR | Status: AC
  Filled 2024-04-20: qty 10

## 2024-04-20 MED ORDER — SODIUM CHLORIDE 0.9 % IV SOLN: 250.0000 mL | INTRAVENOUS | Status: DC | PRN

## 2024-04-20 MED ORDER — ASPIRIN 81 MG PO CHEW
81.0000 mg | CHEWABLE_TABLET | ORAL | Status: AC
  Administered 2024-04-20: 81 mg via ORAL
  Filled 2024-04-20: qty 1

## 2024-04-20 MED ORDER — METOPROLOL SUCCINATE ER 25 MG PO TB24
25.0000 mg | ORAL_TABLET | Freq: Every day | ORAL | Status: DC
  Administered 2024-04-20 – 2024-04-21 (×2): 25 mg via ORAL
  Filled 2024-04-20 (×2): qty 1

## 2024-04-20 MED ORDER — ACETAMINOPHEN 325 MG PO TABS
650.0000 mg | ORAL_TABLET | ORAL | Status: DC | PRN
  Administered 2024-04-22: 650 mg via ORAL
  Filled 2024-04-20: qty 2

## 2024-04-20 MED ORDER — LIDOCAINE HCL (PF) 1 % IJ SOLN
INTRAMUSCULAR | Status: DC | PRN
  Administered 2024-04-20: 2 mL

## 2024-04-20 MED ORDER — VERAPAMIL HCL 2.5 MG/ML IV SOLN
INTRAVENOUS | Status: DC | PRN
  Administered 2024-04-20: 10 mL via INTRA_ARTERIAL

## 2024-04-20 MED ORDER — TRAMADOL HCL 50 MG PO TABS
50.0000 mg | ORAL_TABLET | Freq: Four times a day (QID) | ORAL | Status: DC | PRN
  Administered 2024-04-20 – 2024-04-23 (×7): 50 mg via ORAL
  Filled 2024-04-20 (×7): qty 1

## 2024-04-20 MED ORDER — FREE WATER: 500.0000 mL | Freq: Once | Status: DC

## 2024-04-20 MED ORDER — FREE WATER
500.0000 mL | Freq: Once | Status: AC
  Administered 2024-04-20: 500 mL via ORAL

## 2024-04-20 MED ORDER — HEPARIN (PORCINE) 25000 UT/250ML-% IV SOLN
1100.0000 [IU]/h | INTRAVENOUS | Status: DC
  Administered 2024-04-20: 1000 [IU]/h via INTRAVENOUS
  Administered 2024-04-21: 1100 [IU]/h via INTRAVENOUS
  Filled 2024-04-20 (×2): qty 250

## 2024-04-20 MED ORDER — FENTANYL CITRATE (PF) 100 MCG/2ML IJ SOLN
INTRAMUSCULAR | Status: AC
  Filled 2024-04-20: qty 2

## 2024-04-20 MED ORDER — LABETALOL HCL 5 MG/ML IV SOLN: 10.0000 mg | INTRAVENOUS | Status: AC | PRN

## 2024-04-20 MED ORDER — VERAPAMIL HCL 2.5 MG/ML IV SOLN
INTRAVENOUS | Status: AC
  Filled 2024-04-20: qty 2

## 2024-04-20 MED ORDER — ESTRADIOL 0.5 MG PO TABS
1.0000 mg | ORAL_TABLET | Freq: Every day | ORAL | Status: DC
  Administered 2024-04-21 – 2024-04-23 (×3): 1 mg via ORAL
  Filled 2024-04-20 (×3): qty 2

## 2024-04-20 MED ORDER — LIDOCAINE HCL (PF) 1 % IJ SOLN
INTRAMUSCULAR | Status: AC
  Filled 2024-04-20: qty 30

## 2024-04-20 MED ORDER — SODIUM CHLORIDE 0.9% FLUSH: 3.0000 mL | INTRAVENOUS | Status: DC | PRN

## 2024-04-20 MED ORDER — CLONAZEPAM 0.5 MG PO TABS
0.5000 mg | ORAL_TABLET | Freq: Two times a day (BID) | ORAL | Status: DC | PRN
  Administered 2024-04-20 – 2024-04-22 (×4): 0.5 mg via ORAL
  Filled 2024-04-20 (×4): qty 1

## 2024-04-20 MED ORDER — HEPARIN (PORCINE) IN NACL 1000-0.9 UT/500ML-% IV SOLN
INTRAVENOUS | Status: DC | PRN
  Administered 2024-04-20 (×2): 500 mL

## 2024-04-20 MED ORDER — AMLODIPINE BESYLATE 2.5 MG PO TABS
2.5000 mg | ORAL_TABLET | Freq: Every day | ORAL | Status: DC
  Administered 2024-04-20 – 2024-04-22 (×3): 2.5 mg via ORAL
  Filled 2024-04-20 (×3): qty 1

## 2024-04-20 MED ORDER — IOHEXOL 350 MG/ML SOLN
INTRAVENOUS | Status: DC | PRN
  Administered 2024-04-20: 35 mL

## 2024-04-20 MED ORDER — BUSPIRONE HCL 10 MG PO TABS
20.0000 mg | ORAL_TABLET | Freq: Three times a day (TID) | ORAL | Status: DC
  Administered 2024-04-20 – 2024-04-23 (×9): 20 mg via ORAL
  Filled 2024-04-20 (×6): qty 2
  Filled 2024-04-20 (×3): qty 4
  Filled 2024-04-20 (×3): qty 2
  Filled 2024-04-20 (×3): qty 4

## 2024-04-20 MED ORDER — SODIUM CHLORIDE 0.9% FLUSH: 3.0000 mL | Freq: Two times a day (BID) | INTRAVENOUS | Status: DC

## 2024-04-20 MED ORDER — MORPHINE SULFATE (PF) 2 MG/ML IV SOLN
2.0000 mg | INTRAVENOUS | Status: DC | PRN
  Administered 2024-04-20 – 2024-04-23 (×7): 2 mg via INTRAVENOUS
  Filled 2024-04-20 (×6): qty 1

## 2024-04-20 MED ORDER — FLUTICASONE PROPIONATE 50 MCG/ACT NA SUSP
1.0000 | Freq: Every day | NASAL | Status: DC
  Administered 2024-04-21 – 2024-04-22 (×2): 1 via NASAL
  Filled 2024-04-20: qty 16

## 2024-04-20 MED ORDER — SODIUM CHLORIDE 0.9 % IV SOLN
250.0000 mL | INTRAVENOUS | Status: AC | PRN
Start: 2024-04-20 — End: 2024-04-21

## 2024-04-20 MED ORDER — MIDAZOLAM HCL 2 MG/2ML IJ SOLN
INTRAMUSCULAR | Status: AC
Start: 2024-04-20 — End: 2024-04-20
  Filled 2024-04-20: qty 2

## 2024-04-20 MED ORDER — LEVOTHYROXINE SODIUM 25 MCG PO TABS
25.0000 ug | ORAL_TABLET | Freq: Every day | ORAL | Status: DC
  Administered 2024-04-21 – 2024-04-23 (×3): 25 ug via ORAL
  Filled 2024-04-20 (×3): qty 1

## 2024-04-20 SURGICAL SUPPLY — 8 items
CARD KEY FFR CATHWORX (MISCELLANEOUS) IMPLANT
CATH 5FR JL3.5 JR4 ANG PIG MP (CATHETERS) IMPLANT
DEVICE RAD COMP TR BAND LRG (VASCULAR PRODUCTS) IMPLANT
GLIDESHEATH SLEND SS 6F .021 (SHEATH) IMPLANT
GUIDEWIRE INQWIRE 1.5J.035X260 (WIRE) IMPLANT
PACK CARDIAC CATHETERIZATION (CUSTOM PROCEDURE TRAY) ×1 IMPLANT
SET ATX-X65L (MISCELLANEOUS) IMPLANT
SHEATH PROBE COVER 6X72 (BAG) IMPLANT

## 2024-04-20 NOTE — Progress Notes (Signed)
 PHARMACY - ANTICOAGULATION CONSULT NOTE  Pharmacy Consult for Heparin  Indication: 3v CAD  Allergies  Allergen Reactions   Onion Anaphylaxis   Other Other (See Comments)    Ossmo Prep - drop in BP Steroids Went crazy   Shellfish Allergy Anaphylaxis and Swelling    *pt denies    Amitriptyline  Anxiety, Palpitations and Other (See Comments)    hallucinations   Amoxicillin -Pot Clavulanate Nausea And Vomiting    With high dose, low dose ok   Amoxicillin -Pot Clavulanate Nausea And Vomiting    With high dose, low dose ok   Cefuroxime Nausea And Vomiting   Cephalexin  Other (See Comments)     gi upset  gi upset   Ciprofloxacin  Other (See Comments)     Fevers   Clarithromycin Nausea And Vomiting and Other (See Comments)    Pt is OK with azithromycin   gi upset   Clarithromycin Nausea And Vomiting    Pt is OK with azithromycin    Doxycycline  Other (See Comments) and Hives     gi upset  gi upset   Propranolol  Other (See Comments)    Fatigue and makes her feel crazy, refuses to take.   Sulfa Antibiotics Other (See Comments)    Rash, vaginal blisters   Sulfasalazine Itching    Rash, vaginal blisters Rash, vaginal blisters    Sulfonamide Derivatives Other (See Comments)    vaginal blisters   Bacitracin Hives    Redness, pain at site of application   Isosorbide      Migraine    Ondansetron  Other (See Comments)    CAUSES SEVERE MIGRAINES  Other reaction(s): Headache  CAUSES SEVERE MIGRAINES  CAUSES SEVERE MIGRAINES   Tramadol  Hcl Other (See Comments)    Other reaction(s): OTHER  Other reaction(s): Other (See Comments)  Other reaction(s): OTHER Patient denies says she take for migraines   Benadryl  [Diphenhydramine  Hcl (Sleep)] Anxiety    Extreme agitation   Shrimp Flavor Agent (Non-Screening) Other (See Comments)    unknown    Patient Measurements: Height: 5' (152.4 cm) Weight: 83.9 kg (185 lb) IBW/kg (Calculated) : 45.5 HEPARIN  DW (KG): 65  Vital Signs: Temp:  98.7 F (37.1 C) (08/04 1046) BP: 108/76 (08/04 1530) Pulse Rate: 69 (08/04 1530)  Labs: Recent Labs    04/17/24 1629 04/17/24 1742  HGB 13.9  --   HCT 41.5  --   PLT 281  --   CREATININE 0.68  --   TROPONINIHS 4 6    Estimated Creatinine Clearance: 76.4 mL/min (by C-G formula based on SCr of 0.68 mg/dL).   Medical History: Past Medical History:  Diagnosis Date   Allergic rhinitis due to allergen 12/19/2016   Anxiety    Anxiety state 09/03/2006   Arthralgia of right temporomandibular joint 08/26/2018   Bilateral deafness 01/17/2016   Cardiac murmur 03/19/2024   Chest pain of uncertain etiology 03/19/2024   Clostridium difficile infection    x several times   Deaf    uses sign language   Elevated LDL cholesterol level 04/07/2024   Family history of breast cancer in mother 08/26/2015   Family history of malignant neoplasm of gastrointestinal tract    Frequent PVCs 03/19/2024   Gallstones    GERD 07/01/2006   Qualifier: Diagnosis of   By: Charmayne MD, Arlyss         Hearing difficulty    b/l, 2/2 congenital rubella s/p stapedectomy. Using hearing aid   Hepatic cyst    6 mm on CT done done in 05/2005  Hyperglycemia 04/07/2024   Hypothyroidism    IBS 07/01/2006   Qualifier: Diagnosis of   By: Charmayne MD, Arlyss         Low back pain 10/09/2022   Migraine 12/02/2012   Obesity    Obesity (BMI 35.0-39.9 without comorbidity) 02/18/2014   On hormone replacement therapy 04/07/2024   Pain in thoracic spine 10/09/2022   Palpitations 03/05/2024   PONV (postoperative nausea and vomiting)    sometimes; depends on how long I'm under   Routine adult health maintenance 09/01/2012   MMG - Normal 2014 - she is due and I reminded her to schedule.   Pelvic - hysterectomy, no cervix.  Sees OBGYN  Flu vaccine seasonally.      DEXA - at 53yo      Screening for colon cancer 10/22/2023   Screening mammogram for breast cancer 04/07/2024    Medications:  Medications Prior to Admission   Medication Sig Dispense Refill Last Dose/Taking   Ascorbic Acid (VITAMIN C PO) Take 1 tablet by mouth daily.   04/19/2024   busPIRone  (BUSPAR ) 10 MG tablet TAKE 2 TABLETS BY MOUTH 3 TIMES A DAY 540 tablet 2 04/20/2024   clonazePAM  (KLONOPIN ) 0.5 MG tablet Take 1 tablet (0.5 mg total) by mouth 2 (two) times daily as needed for anxiety. 60 tablet 4 04/20/2024   estradiol  (ESTRACE ) 1 MG tablet TAKE 1 TABLET BY MOUTH EVERY DAY 90 tablet 3 04/20/2024   fluticasone  (FLONASE ) 50 MCG/ACT nasal spray SPRAY 1 SPRAY INTO EACH NOSTRIL EVERY DAY 16 mL 2 04/20/2024   isosorbide  mononitrate (IMDUR ) 30 MG 24 hr tablet Take 1 tablet (30 mg total) by mouth daily. 30 tablet 0 Past Week   levothyroxine  (SYNTHROID ) 25 MCG tablet TAKE 1 TABLET BY MOUTH EVERY DAY 90 tablet 3 04/20/2024   pantoprazole  (PROTONIX ) 40 MG tablet TAKE 1 TABLET BY MOUTH EVERY DAY 90 tablet 3 04/19/2024   promethazine  (PHENERGAN ) 12.5 MG tablet Take 1 tablet (12.5 mg total) by mouth every 6 (six) hours as needed for nausea or vomiting. 30 tablet 0 04/19/2024   traMADol  (ULTRAM ) 50 MG tablet TAKE 1 TABLET BY MOUTH EVERY 6 HOURS AS NEEDED FOR SEVERE PAIN 40 tablet 0 04/19/2024   metoprolol  succinate (TOPROL  XL) 25 MG 24 hr tablet Take 1 tablet (25 mg total) by mouth daily. 90 tablet 3     Assessment: 53 y.o. F s/p cath which showed 3v CAD - evaluating for CABG vs PCI. Plan to start heparin  2 hours post TR band removal. TR band removed 1730. Received 4500 units heparin  in cath lab. No AC PTA.  Goal of Therapy:  Heparin  level 0.3-0.7 units/ml Monitor platelets by anticoagulation protocol: Yes   Plan:  At 1930, start heparin  gtt at 1000 units/hr Will f/u heparin  level in 8 hours Daily heparin  level and CBC  Vito Ralph, PharmD, BCPS Please see amion for complete clinical pharmacist phone list 04/20/2024,3:43 PM

## 2024-04-20 NOTE — Plan of Care (Signed)

## 2024-04-20 NOTE — Interval H&P Note (Signed)
 History and Physical Interval Note:  04/20/2024 1:39 PM  Ailed P Pantaleon  has presented today for surgery, with the diagnosis of unstable angina.  The various methods of treatment have been discussed with the patient and family. After consideration of risks, benefits and other options for treatment, the patient has consented to  Procedure(s): LEFT HEART CATH AND CORONARY ANGIOGRAPHY (N/A) as a surgical intervention.  The patient's history has been reviewed, patient examined, no change in status, stable for surgery.  I have reviewed the patient's chart and labs.  Questions were answered to the patient's satisfaction.    Cath Lab Visit (complete for each Cath Lab visit)  Clinical Evaluation Leading to the Procedure:   ACS: No.  Non-ACS:    Anginal Classification: CCS IV  Anti-ischemic medical therapy: Maximal Therapy (2 or more classes of medications)  Non-Invasive Test Results: Low-risk stress test findings: cardiac mortality <1%/year  Prior CABG: No previous CABG  Taytum Scheck

## 2024-04-20 NOTE — Progress Notes (Signed)
 [facility] Interventional Cardiology Note  Date: 04/20/24  Time: 4:44 PM  Patient reevaluated this afternoon following catheterization because she was complaining of 10/10 chest pain following cath (she also rated her CP 10/10 before start of cath and notes that it has been constant over the last 3 days).  Cath results were reviewed with Ms. Mcdermid again in detail with cardiac surgery evaluation initiated.  I will repeat an EKG and HS-TnI x 2 (both were unremarkable during ED visit 3 days ago).  Continue prn morphine , as this offered mild relief of pain.  Amlodipine  has also been added for antianginal tx, as Ms. Pottle did not tolerate isosorbide  mononitrate due to severe migraine HA.  If she has evidence of ongoing ischemia by EKG/HS-TnI with refractory chest pain, expedited CABG versus PCI to LCx and RCA would need to be considered.  Hopefully, we can control her pain medically to allow for non-emergent revascularization later this week.  Heparin  infusion to begin 2 hours after TR band is removed.  Lonni Hanson, MD [facility]

## 2024-04-20 NOTE — Progress Notes (Signed)
 Dr. Mady paged and spoke to regarding c/o chest pain 10/10 and requested pain medication.

## 2024-04-20 NOTE — Plan of Care (Signed)
  Problem: Education: Goal: Understanding of CV disease, CV risk reduction, and recovery process will improve Outcome: Progressing   Problem: Activity: Goal: Ability to return to baseline activity level will improve Outcome: Progressing   Problem: Cardiovascular: Goal: Ability to achieve and maintain adequate cardiovascular perfusion will improve Outcome: Progressing Goal: Vascular access site(s) Level 0-1 will be maintained Outcome: Progressing   

## 2024-04-20 NOTE — Brief Op Note (Signed)
 BRIEF CARDIAC CATHETERIZATION NOTE  04/20/2024  3:10 PM  PATIENT:  Donna Davis  53 y.o. female  PRE-OPERATIVE DIAGNOSIS:  Unstable angina  POST-OPERATIVE DIAGNOSIS:  Same  PROCEDURE:  Procedure(s): LEFT HEART CATH AND CORONARY ANGIOGRAPHY (N/A) Coronary Pressure/FFR w/3D Mapping (N/A)  SURGEON:  Surgeons and Role:    * Sally Menard, Lonni, MD - Primary  FINDINGS: Significant 3-vessel CAD with diffuse mid LAD disease of up to 60-70% (virtual FFR 0.59), 90% mid/distal LCx stenosis (virtual FFR 0.62), and 70-80% proximal/mid RCA disease (virtual FFR 0.55). Upper normal left ventricular filling pressure (LVEDP 15 mmHg).  RECOMMENDATIONS: Admit for expedited cardiac surgery evaluation for CABG versus multivessel PCI. Add amlodipine  for antianginal therapy (patient did not tolerate recent addition of Imdur ). Initiate heparin  infusion 2 hours after TR band has been removed in the setting of unstable angina.  Lonni Hanson, MD [facility]

## 2024-04-20 NOTE — Consult Note (Cosign Needed)
 [address]       [place] [address]             [phone]        [facility] Medical Record #[id number] Date of Birth: 1971/04/17  Referring: No ref. provider found Primary Care: Donna Fellows, MD Primary Cardiologist:None  Chief Complaint:   No chief complaint on file.   History of Present Illness:     Donna Davis is a 53 year old female with a past medical history of chest pain, syncope, bilateral sensorineural hearing loss, hyperlipidemia, frequent PVCs, obesity, hypothyroidism, former smoker, GERD, and IBS. The patient saw Dr. Edwyna on 07/29 and reported syncope of unclear etiology about 3 weeks ago when driving a golf cart and does not recall the event. She reports she also presented to the fire department in June and they did 3 EKGs which showed atrial fibrillation but EKG in the ED showed NSR with PVCs. She underwent zio patch x 14 days which showed frequent PVCs. She underwent echo stress test on 08/01 and developed severe exercise limiting chest pain, ST depressions in V2, V3, and aVF and she developed nausea, fatigue and hypertension. She reports she does not remember getting from the treadmill to the bed. She was transferred to the ED for further workup and was given 0.4 nitroglycerin  and ASA 324mg  en route but still developed chest pain, left arm pain and nausea. EKG in the ED showed NSR and no acute ischemia. Troponin I (high sensitivity) remained within normal limits. Cardiology saw the patient and she was started on Imdur  30mg  daily for angina, outpatient cardiac catheterization was arranged for 08/04. Cardiac catheterization showed significant 3 vessel CAD with diffuse mid LAD disease up to 60-70% (virtual FFR 0.59), 90% mid/distal Lcx stenosis (virtual FFR 0.62) and 70-80% proximal/mid RCA disease (virtual FFR 0.55), and upper normal left ventricular filling pressure (LVEDP ). Echocardiogram 07/11 showed LVEF 65-70%, grade I diastolic  dysfunction, trivial mitral valve regurgitation and no other valvular abnormalities. The patient did not tolerate Imdur  so she was started on Amlodipine  for antianginal therapy and heparin  was initiated. The patient continues to have unstable angina and reports chest pain currently at rest.  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 She lives with her husband. They have vacation planned next week but understand they may have to cancel this if she decides to proceed with surgery. She reports in the last month she has had difficulty completing ADLs and can only walk short distances due to symptoms.    Current Activity/ Functional Status: Patient is independent with mobility/ambulation, transfers, ADL's, IADL's.   Zubrod Score: At the time of surgery this patient's most appropriate activity status/level should be described as: []     0    Normal activity, no symptoms []     1    Restricted in physical strenuous activity but ambulatory, able to do out light work [x]     2    Ambulatory and capable of self care, unable to do work activities, up and about                 more than 50%  Of the time                            []     3    Only limited self care, in bed greater  than 50% of waking hours []     4    Completely disabled, no self care, confined to bed or chair []     5    Moribund  Past Medical History:  Diagnosis Date   Allergic rhinitis due to allergen 12/19/2016   Anxiety    Anxiety state 09/03/2006   Arthralgia of right temporomandibular joint 08/26/2018   Bilateral deafness 01/17/2016   Cardiac murmur 03/19/2024   Chest pain of uncertain etiology 03/19/2024   Clostridium difficile infection    x several times   Deaf    uses sign language   Elevated LDL cholesterol level 04/07/2024   Family history of breast cancer in mother 08/26/2015   Family history of malignant neoplasm of gastrointestinal tract    Frequent PVCs 03/19/2024   Gallstones    GERD  07/01/2006   Qualifier: Diagnosis of   By: Charmayne MD, Arlyss         Hearing difficulty    b/l, 2/2 congenital rubella s/p stapedectomy. Using hearing aid   Hepatic cyst    6 mm on CT done done in 05/2005   Hyperglycemia 04/07/2024   Hypothyroidism    IBS 07/01/2006   Qualifier: Diagnosis of   By: Charmayne MD, Arlyss         Low back pain 10/09/2022   Migraine 12/02/2012   Obesity    Obesity (BMI 35.0-39.9 without comorbidity) 02/18/2014   On hormone replacement therapy 04/07/2024   Pain in thoracic spine 10/09/2022   Palpitations 03/05/2024   PONV (postoperative nausea and vomiting)    sometimes; depends on how long I'm under   Routine adult health maintenance 09/01/2012   MMG - Normal 2014 - she is due and I reminded her to schedule.   Pelvic - hysterectomy, no cervix.  Sees OBGYN  Flu vaccine seasonally.      DEXA - at 53yo      Screening for colon cancer 10/22/2023   Screening mammogram for breast cancer 04/07/2024    Past Surgical History:  Procedure Laterality Date   ABDOMINAL ADHESION SURGERY     Enterolysis   CESAREAN SECTION  1994   CHOLECYSTECTOMY N/A 06/26/2013   Procedure: LAPAROSCOPIC CHOLECYSTECTOMY WITH ATTEMPTED INTRAOPERATIVE CHOLANGIOGRAM;  Surgeon: Vicenta DELENA Poli, MD;  Location: [facility];  Service: General;  Laterality: N/A;   COCHLEAR IMPLANT  03/30/2016   COCHLEAR IMPLANT Right 03/2015   COLONOSCOPY WITH PROPOFOL   04/23/2012   DIAGNOSTIC LAPAROSCOPY  01/14/2006   I've had several (07/23/2017)   ESOPHAGOGASTRODUODENOSCOPY (EGD) WITH PROPOFOL   04/23/2012   JOINT REPLACEMENT     KNEE SURGERY Right 2014   LAPAROSCOPIC OVARIAN CYSTECTOMY Right 04/16/2000   SALPINGOOPHORECTOMY Left    STAPEDECTOMY Left 05/14/2006   Thyroid  Nodule removed  1997   TONSILLECTOMY     TOTAL ABDOMINAL HYSTERECTOMY  01/13/2004   Endometriosis with residual right ovary. Multiple GYN surgeries for chronic pelvic pain; had LSO in past   TUBAL LIGATION     WOUND EXPLORATION Right  10/16/2016   Procedure: RIGHT EXPLORATION OF LACERATION OF INDEX FINGER;  Surgeon: Franky Curia, MD;  Location: [facility];  Service: Orthopedics;  Laterality: Right;    Social History   Tobacco Use  Smoking Status Former   Current packs/day: 0.00   Average packs/day: 1 pack/day for 26.0 years (26.0 ttl pk-yrs)   Types: Cigarettes   Start date: 02/17/1981   Quit date: 02/18/2007   Years since quitting: 17.1  Smokeless Tobacco Never  Tobacco Comments  quit 2008    Social History   Substance and Sexual Activity  Alcohol Use Yes   Comment: Very Rarely     Allergies  Allergen Reactions   Onion Anaphylaxis   Other Other (See Comments)    Ossmo Prep - drop in BP Steroids Went crazy   Shellfish Allergy Anaphylaxis and Swelling    *pt denies    Amitriptyline  Anxiety, Palpitations and Other (See Comments)    hallucinations   Amoxicillin -Pot Clavulanate Nausea And Vomiting    With high dose, low dose ok   Amoxicillin -Pot Clavulanate Nausea And Vomiting    With high dose, low dose ok   Cefuroxime Nausea And Vomiting   Cephalexin  Other (See Comments)     gi upset  gi upset   Ciprofloxacin  Other (See Comments)     Fevers   Clarithromycin Nausea And Vomiting and Other (See Comments)    Pt is OK with azithromycin   gi upset   Clarithromycin Nausea And Vomiting    Pt is OK with azithromycin    Doxycycline  Other (See Comments) and Hives     gi upset  gi upset   Propranolol  Other (See Comments)    Fatigue and makes her feel crazy, refuses to take.   Sulfa Antibiotics Other (See Comments)    Rash, vaginal blisters   Sulfasalazine Itching    Rash, vaginal blisters Rash, vaginal blisters    Sulfonamide Derivatives Other (See Comments)    vaginal blisters   Bacitracin Hives    Redness, pain at site of application   Isosorbide      Migraine    Ondansetron  Other (See Comments)    CAUSES SEVERE MIGRAINES  Other reaction(s): Headache  CAUSES SEVERE  MIGRAINES  CAUSES SEVERE MIGRAINES   Tramadol  Hcl Other (See Comments)    Other reaction(s): OTHER  Other reaction(s): Other (See Comments)  Other reaction(s): OTHER Patient denies says she take for migraines   Benadryl  [Diphenhydramine  Hcl (Sleep)] Anxiety    Extreme agitation   Shrimp Flavor Agent (Non-Screening) Other (See Comments)    unknown    Current Facility-Administered Medications  Medication Dose Route Frequency Provider Last Rate Last Admin   0.9 %  sodium chloride  infusion  250 mL Intravenous PRN End, Lonni, MD       fentaNYL  (SUBLIMAZE ) injection    PRN End, Lonni, MD   25 mcg at 04/20/24 1406   free water  500 mL  500 mL Oral Once End, Christopher, MD       Heparin  (Porcine) in NaCl 1000-0.9 UT/500ML-% SOLN    PRN End, Lonni, MD   500 mL at 04/20/24 1353   heparin  sodium (porcine) injection    PRN End, Lonni, MD   4,500 Units at 04/20/24 1405   iohexol  (OMNIPAQUE ) 350 MG/ML injection    PRN End, Lonni, MD   35 mL at 04/20/24 1442   lidocaine  (PF) (XYLOCAINE ) 1 % injection    PRN End, Lonni, MD   2 mL at 04/20/24 1402   midazolam  (VERSED ) injection    PRN End, Lonni, MD   1 mg at 04/20/24 1406   Radial Cocktail/Verapamil  only    PRN End, Lonni, MD   10 mL at 04/20/24 1403   sodium chloride  flush (NS) 0.9 % injection 3 mL  3 mL Intravenous Q12H End, Christopher, MD       sodium chloride  flush (NS) 0.9 % injection 3 mL  3 mL Intravenous PRN End, Lonni, MD        Medications  Prior to Admission  Medication Sig Dispense Refill Last Dose/Taking   Ascorbic Acid (VITAMIN C PO) Take 1 tablet by mouth daily.   04/19/2024   busPIRone  (BUSPAR ) 10 MG tablet TAKE 2 TABLETS BY MOUTH 3 TIMES A DAY 540 tablet 2 04/20/2024   clonazePAM  (KLONOPIN ) 0.5 MG tablet Take 1 tablet (0.5 mg total) by mouth 2 (two) times daily as needed for anxiety. 60 tablet 4 04/20/2024   estradiol  (ESTRACE ) 1 MG tablet TAKE 1 TABLET BY MOUTH EVERY DAY 90 tablet 3  04/20/2024   fluticasone  (FLONASE ) 50 MCG/ACT nasal spray SPRAY 1 SPRAY INTO EACH NOSTRIL EVERY DAY 16 mL 2 04/20/2024   isosorbide  mononitrate (IMDUR ) 30 MG 24 hr tablet Take 1 tablet (30 mg total) by mouth daily. 30 tablet 0 Past Week   levothyroxine  (SYNTHROID ) 25 MCG tablet TAKE 1 TABLET BY MOUTH EVERY DAY 90 tablet 3 04/20/2024   pantoprazole  (PROTONIX ) 40 MG tablet TAKE 1 TABLET BY MOUTH EVERY DAY 90 tablet 3 04/19/2024   promethazine  (PHENERGAN ) 12.5 MG tablet Take 1 tablet (12.5 mg total) by mouth every 6 (six) hours as needed for nausea or vomiting. 30 tablet 0 04/19/2024   traMADol  (ULTRAM ) 50 MG tablet TAKE 1 TABLET BY MOUTH EVERY 6 HOURS AS NEEDED FOR SEVERE PAIN 40 tablet 0 04/19/2024   metoprolol  succinate (TOPROL  XL) 25 MG 24 hr tablet Take 1 tablet (25 mg total) by mouth daily. 90 tablet 3     Family History  Problem Relation Age of Onset   Breast cancer Mother    Ovarian cancer Mother    Stroke Father    Neuropathy Father    Diabetes Father    Heart disease Father        heart attack   Irritable bowel syndrome Father    Kidney disease Father    Stomach cancer Maternal Grandmother    Colon cancer Maternal Grandmother    Diabetes Maternal Grandmother    Bipolar disorder Daughter    Heart disease Other        both grandmothers   Esophageal cancer Neg Hx    Liver disease Neg Hx      Review of Systems:   Review of Systems  Constitutional:  Positive for diaphoresis and malaise/fatigue. Negative for chills, fever and weight loss.  HENT:  Positive for hearing loss.        Bilateral sensorineural hearing loss with complete deafness  Eyes:  Positive for blurred vision.       Uses glasses  Respiratory:  Positive for cough and shortness of breath. Negative for sputum production and wheezing.        Dry cough  Cardiovascular:  Positive for chest pain, palpitations and leg swelling. Negative for orthopnea and claudication.  Gastrointestinal:  Positive for nausea. Negative for  heartburn and vomiting.  Genitourinary:  Negative for dysuria.  Musculoskeletal:  Negative for myalgias.  Skin:  Negative for rash.  Neurological:  Positive for dizziness, loss of consciousness and weakness. Negative for focal weakness.  Endo/Heme/Allergies:  Bruises/bleeds easily.       Unexplained bruises        Physical Exam: BP (!) 146/89   Pulse (!) 0   Temp 98.7 F (37.1 C)   Resp 15   Ht 5' (1.524 m)   Wt 83.9 kg   LMP 12/26/2004   SpO2 100%   BMI 36.13 kg/m   General appearance: alert, cooperative, no distress, and moderately obese Head: Normocephalic, without obvious abnormality, atraumatic Neck:  no adenopathy, no carotid bruit, no JVD, supple, symmetrical, trachea midline, and thyroid  not enlarged, symmetric, no tenderness/mass/nodules Lymph nodes: Cervical, supraclavicular, and axillary nodes normal. Resp: clear to auscultation bilaterally Cardio: regular rate and rhythm, no murmur, no rubs GI: soft, non-tender; bowel sounds normal; no masses,  no organomegaly Extremities: no edema, minimal varicose veins, small bruises/discoloration on legs Neurologic: Grossly normal  Diagnostic Studies & Radiology Findings:   ECHOCARDIOGRAM REPORT    Patient Name:   Donna Davis  Date of Exam: 03/27/2024 Medical Rec #:  [id number]     Height:       60.0 in Accession #:    [id number]    Weight:       187.0 lb Date of Birth:  08/10/1971      BSA:          1.814 m Patient Age:    53 years      BP:           133/84 mmHg Patient Gender: F             HR:           61 bpm. Exam Location:  [address]  Procedure: 2D Echo, 3D Echo, Cardiac Doppler, Color Doppler and Strain Analysis            (Both Spectral and Color Flow Doppler were utilized during            procedure).  Indications:    R01.1 Murmur   History:        Patient has prior history of Echocardiogram examinations and                 Patient has no prior history of Echocardiogram examinations.                  Arrythmias:PVC, Signs/Symptoms:Hypotension, 2/6 systolic murmur                 at apex Palpitations and Chest Pain; Risk Factors:Obesity and                 Former Smoker.   Sonographer:    Nolon Berg BA, RDCS Referring Phys: RAJAN R [facility]  IMPRESSIONS   1. Left ventricular ejection fraction, by estimation, is 65 to 70%. Left ventricular ejection fraction by 3D volume is 68 %. The left ventricle has normal function. The left ventricle has no regional wall motion abnormalities. Left ventricular diastolic  parameters are consistent with Grade I diastolic dysfunction (impaired relaxation). The average left ventricular global longitudinal strain is -20.0 %. The global longitudinal strain is normal.  2. Right ventricular systolic function is normal. The right ventricular size is normal.  3. The mitral valve is normal in structure. Trivial mitral valve regurgitation. No evidence of mitral stenosis.  4. The aortic valve is normal in structure. Aortic valve regurgitation is not visualized. No aortic stenosis is present.  5. The inferior vena cava is dilated in size with >50% respiratory variability, suggesting right atrial pressure of 8 mmHg.  FINDINGS  Left Ventricle: Left ventricular ejection fraction, by estimation, is 65 to 70%. Left ventricular ejection fraction by 3D volume is 68 %. The left ventricle has normal function. The left ventricle has no regional wall motion abnormalities. The average left ventricular global longitudinal strain is -20.0 %. Strain was performed and the global longitudinal strain is normal. The left ventricular internal cavity size was normal in size. There is no left ventricular hypertrophy. Left ventricular  diastolic parameters are consistent with Grade I diastolic dysfunction (impaired relaxation). Indeterminate filling pressures.  Right Ventricle: The right ventricular size is normal. No increase in right ventricular wall thickness. Right  ventricular systolic function is normal.  Left Atrium: Left atrial size was normal in size.  Right Atrium: Right atrial size was normal in size.  Pericardium: There is no evidence of pericardial effusion.  Mitral Valve: The mitral valve is normal in structure. Trivial mitral valve regurgitation. No evidence of mitral valve stenosis.  Tricuspid Valve: The tricuspid valve is normal in structure. Tricuspid valve regurgitation is not demonstrated. No evidence of tricuspid stenosis.  Aortic Valve: The aortic valve is normal in structure. Aortic valve regurgitation is not visualized. No aortic stenosis is present.  Pulmonic Valve: The pulmonic valve was normal in structure. Pulmonic valve regurgitation is not visualized. No evidence of pulmonic stenosis.  Aorta: The aortic root is normal in size and structure.  Venous: The inferior vena cava is dilated in size with greater than 50% respiratory variability, suggesting right atrial pressure of 8 mmHg.  IAS/Shunts: No atrial level shunt detected by color flow Doppler.  Additional Comments: 3D was performed not requiring image post processing on an independent workstation and was normal.    LEFT VENTRICLE PLAX 2D LVIDd:         4.40 cm         Diastology LVIDs:         2.50 cm         LV e' medial:    6.20 cm/s LV PW:         0.80 cm         LV E/e' medial:  13.3 LV IVS:        0.80 cm         LV e' lateral:   10.80 cm/s LVOT diam:     1.85 cm         LV E/e' lateral: 7.6 LV SV:         55 LV SV Index:   30              2D Longitudinal LVOT Area:     2.69 cm        Strain                                2D Strain GLS   -19.1 %                                (A4C):                                2D Strain GLS   -21.7 %                                (A3C):                                2D Strain GLS   -19.4 %                                (A2C):  2D Strain GLS   -20.0 %                                 Avg:                                  3D Volume EF                                LV 3D EF:    Left                                             ventricul                                             ar                                             ejection                                             fraction                                             by 3D                                             volume is                                             68 %.                                  3D Volume EF:                                3D EF:        68 %                                LV EDV:       87 ml                                LV ESV:       28 ml  LV SV:        59 ml  RIGHT VENTRICLE             IVC RV Basal diam:  1.90 cm     IVC diam: 2.10 cm RV S prime:     12.90 cm/s TAPSE (M-mode): 3.1 cm  LEFT ATRIUM             Index        RIGHT ATRIUM          Index LA diam:        3.40 cm 1.87 cm/m   RA Area:     5.82 cm LA Vol (A2C):   33.6 ml 18.52 ml/m  RA Volume:   7.97 ml  4.39 ml/m LA Vol (A4C):   32.4 ml 17.86 ml/m LA Biplane Vol: 33.2 ml 18.30 ml/m  AORTIC VALVE LVOT Vmax:   82.80 cm/s LVOT Vmean:  56.100 cm/s LVOT VTI:    0.205 m   AORTA Ao Root diam: 3.10 cm Ao Asc diam:  2.90 cm  MITRAL VALVE MV Area (PHT): 3.42 cm     SHUNTS MV Decel Time: 222 msec     Systemic VTI:  0.20 m MV E velocity: 82.30 cm/s   Systemic Diam: 1.85 cm MV A velocity: 100.00 cm/s MV E/A ratio:  0.82  Annabella Scarce MD Electronically signed by Annabella Scarce MD Signature Date/Time: 03/27/2024/4:18:19 PM       Final    BRIEF CARDIAC CATHETERIZATION NOTE   04/20/2024   3:10 PM   PATIENT:  Donna Davis  53 y.o. female   PRE-OPERATIVE DIAGNOSIS:  Unstable angina   POST-OPERATIVE DIAGNOSIS:  Same   PROCEDURE:  Procedure(s): LEFT HEART CATH AND CORONARY ANGIOGRAPHY (N/A) Coronary Pressure/FFR w/3D Mapping (N/A)   SURGEON:  Surgeons and  Role:    * End, Lonni, MD - Primary   FINDINGS: Significant 3-vessel CAD with diffuse mid LAD disease of up to 60-70% (virtual FFR 0.59), 90% mid/distal LCx stenosis (virtual FFR 0.62), and 70-80% proximal/mid RCA disease (virtual FFR 0.55). Upper normal left ventricular filling pressure (LVEDP 15 mmHg).   Assessment & Plan: 3 vessel CAD: The patient has significant 3 vessel CAD with unstable angina. She would benefit from CABG surgery. Cardiology reports they could likely stent circumflex and RCA but not LAD, they are also not comfortable sending her home with current chest pain. Dr. Shyrl to ultimately determine surgical candidacy and timing but not much availability this week. Frequent PVCs: Zio patch in place Hyperlipidemia Class 2 obesity Hypothyroid  Con GORMAN [place], PA-C 04/20/24 I spent approximately 35 minutes with the patient face to face   Agree. 53yo female with 3V CAD.  On review of the LHC, the distal OM target is small, and the LAD lesion appears non-critical.  I was surprised that the FFR was so low.  I discussed the case further with Dr. Mady, and we think that PCI to the RCA and circ lesion can be performed.  The LAD lesion will be treated medically.    Harrell MALVA Shyrl

## 2024-04-21 ENCOUNTER — Other Ambulatory Visit (HOSPITAL_COMMUNITY)

## 2024-04-21 ENCOUNTER — Telehealth: Payer: Self-pay | Admitting: *Deleted

## 2024-04-21 ENCOUNTER — Ambulatory Visit: Payer: Self-pay | Admitting: Student

## 2024-04-21 DIAGNOSIS — I200 Unstable angina: Secondary | ICD-10-CM

## 2024-04-21 LAB — CBC
HCT: 38.4 % (ref 36.0–46.0)
Hemoglobin: 12.7 g/dL (ref 12.0–15.0)
MCH: 30.8 pg (ref 26.0–34.0)
MCHC: 33.1 g/dL (ref 30.0–36.0)
MCV: 93.2 fL (ref 80.0–100.0)
Platelets: 225 K/uL (ref 150–400)
RBC: 4.12 MIL/uL (ref 3.87–5.11)
RDW: 11.6 % (ref 11.5–15.5)
WBC: 7.6 K/uL (ref 4.0–10.5)
nRBC: 0 % (ref 0.0–0.2)

## 2024-04-21 LAB — HEPARIN LEVEL (UNFRACTIONATED)
Heparin Unfractionated: 0.26 [IU]/mL — ABNORMAL LOW (ref 0.30–0.70)
Heparin Unfractionated: 0.39 [IU]/mL (ref 0.30–0.70)

## 2024-04-21 MED ORDER — PRASUGREL HCL 10 MG PO TABS
10.0000 mg | ORAL_TABLET | Freq: Every day | ORAL | Status: DC
  Administered 2024-04-22 – 2024-04-23 (×2): 10 mg via ORAL
  Filled 2024-04-21 (×2): qty 1

## 2024-04-21 MED ORDER — SENNOSIDES-DOCUSATE SODIUM 8.6-50 MG PO TABS
2.0000 | ORAL_TABLET | Freq: Two times a day (BID) | ORAL | Status: DC
  Administered 2024-04-21 – 2024-04-23 (×5): 2 via ORAL
  Filled 2024-04-21 (×6): qty 2

## 2024-04-21 MED ORDER — METOPROLOL SUCCINATE ER 50 MG PO TB24
50.0000 mg | ORAL_TABLET | Freq: Every day | ORAL | Status: DC
  Administered 2024-04-22 – 2024-04-23 (×2): 50 mg via ORAL
  Filled 2024-04-21 (×2): qty 1

## 2024-04-21 MED ORDER — ASPIRIN 81 MG PO TBEC
81.0000 mg | DELAYED_RELEASE_TABLET | Freq: Every day | ORAL | Status: DC
  Administered 2024-04-21 – 2024-04-23 (×2): 81 mg via ORAL
  Filled 2024-04-21 (×3): qty 1

## 2024-04-21 MED ORDER — PRASUGREL HCL 10 MG PO TABS
60.0000 mg | ORAL_TABLET | Freq: Once | ORAL | Status: AC
  Administered 2024-04-21: 60 mg via ORAL
  Filled 2024-04-21: qty 6

## 2024-04-21 MED ORDER — ROSUVASTATIN CALCIUM 20 MG PO TABS
20.0000 mg | ORAL_TABLET | Freq: Every day | ORAL | Status: DC
  Administered 2024-04-21 – 2024-04-23 (×3): 20 mg via ORAL
  Filled 2024-04-21 (×3): qty 1

## 2024-04-21 NOTE — Plan of Care (Signed)

## 2024-04-21 NOTE — Telephone Encounter (Signed)
 Reviewed Donna Davis's chart and have spoken with her and her husband. Will follow hospital progression and plan for clinic f/u within 1 week of discharge.

## 2024-04-21 NOTE — Progress Notes (Addendum)
 Progress Note  Patient Name: Donna Davis Date of Encounter: 04/21/2024 [facility] Cardiologist: None   Interval Summary    Patient reports that she has continued to have chest pain since her cath. Pain feels like a vice grip on her heart. Worsens a bit with deep inhalation. Also worsens when she has palpitations. Her and her husband had questions about surgery, which I was unable to answer. Sign language interpreter at bedside   Vital Signs Vitals:   04/20/24 1942 04/20/24 2353 04/21/24 0414 04/21/24 0828  BP: 118/74 117/81 109/76 122/65  Pulse: 72 62 65 72  Resp: 17 18 18 20   Temp:  98.2 F (36.8 C) 98 F (36.7 C) 98.2 F (36.8 C)  TempSrc: Oral Oral Oral Oral  SpO2: 96% 96% 97% 97%  Weight:      Height:        Intake/Output Summary (Last 24 hours) at 04/21/2024 0855 Last data filed at 04/21/2024 0830 Gross per 24 hour  Intake 1104.89 ml  Output 0 ml  Net 1104.89 ml      04/20/2024    4:09 PM 04/20/2024   10:46 AM 04/17/2024    2:20 PM  Last 3 Weights  Weight (lbs) 184 lb 15.3 oz 185 lb 186 lb 3.2 oz  Weight (kg) 83.895 kg 83.915 kg 84.46 kg      Telemetry/ECG  NSR - Personally Reviewed  Physical Exam  GEN: No acute distress.  Sitting upright in the bed  Neck: No JVD Cardiac:  RRR, no murmurs, rubs, or gallops.  Respiratory: Clear to auscultation bilaterally. Normal WOB on room air  GI: Soft, nontender, non-distended  MS: No edema in BLE   Assessment & Plan   CAD  Chest pain - Patient underwent outpatient cardiac catheterization yesterday, found to have significant three-vessel CAD including diffuse mid LAD disease up to 50-60%, 90% distal left circumflex lesion, sequential 60-80% proximal/mid RCA stenoses - Prior to cath, patient had ongoing chest pain for 3 days.  Recommended admission for CT surgery evaluation - Seen by CT surgery APP yesterday, Dr. Shyrl to determine surgical candidacy and timing.  Note not much availability this week -  Yesterday afternoon, patient continued to have 10/10 chest pain following cath.  Has as needed morphine  ordered and is on amlodipine  2.5 mg daily.  Patient unable to tolerate Imdur  due to severe migraine headaches - Troponin negative x 2 yesterday - Per Dr. Mady, if patient has refractory chest pain, consider expedited CABG versus PCI to left circumflex and RCA.  - Patient continues to have chest pain this AM. Constant. Worse with deep inhalation or palpitations  - Continue IV heparin  - Increase metoprolol  succinate to 50 mg daily  - Start Aspirin  81 mg daily - Lipid panel, A1c ordered for tomorrow AM   HLD  - Lipid panel this admission pending - With multivessel CAD, start Crestor  20 mg daily  Hypothyroidism  - Continue home synthroid    For questions or updates, please contact [facility] Please consult [email or web address] for contact info under       Signed, Rollo FABIENE Louder, PA-C   Patient seen, examined. Available data reviewed. Agree with findings, assessment, and plan as outlined by Rollo Louder, PA-C.  My evaluation is conducted in the presence of a sign language interpreter.  The patient's husband is present as well.  The patient continues to have near constant chest pain that is worse with exertion.  Her cardiac biomarkers have been consistently normal.  Her EKG shows no acute ischemic changes.  She is on good medical therapy with a beta-blocker and long-acting nitrate.  I reviewed the patient's cardiac catheterization films.  She has multivessel disease with severe left circumflex and RCA stenoses and moderate diffuse LAD stenosis.  After review of her cath films and discussion of treatment options, I discussed the pros and cons of complete revascularization with cardiac surgery versus PCI focused on the most critical lesions.  I think at age 53 with normal LV function and an absence of diabetes, it is appropriate to treat the severe stenoses in the RCA and left circumflex  and manage her LAD disease medically.  This would not burn any bridges for future bypass surgery and I suspect she will have complete relief of her anginal symptoms with treatment of her severe lesions which are both pretty focal and treatable with stenting.  This is also discussed with the cardiac surgical team who agrees with our plan.  The patient will be loaded with prasugrel  60 mg today and 10 mg daily starting tomorrow.  Will plan for her to undergo PCI of the left circumflex and right coronary arteries with Dr. Darron. I have reviewed the risks, indications, and alternatives to cardiac catheterization, possible angioplasty, and stenting with the patient. Risks include but are not limited to bleeding, infection, vascular injury, stroke, myocardial infection, arrhythmia, kidney injury, radiation-related injury in the case of prolonged fluoroscopy use, emergency cardiac surgery, and death. The patient understands the risks of serious complication is 1-2 in 1000 with diagnostic cardiac cath and 1-2% or less with angioplasty/stenting.  Otherwise, as outlined above.  Donna Davis, M.D. 04/21/2024 1:27 PM

## 2024-04-21 NOTE — Telephone Encounter (Signed)
 Copied from CRM #[id number]. Topic: General - Other >> Apr 21, 2024 10:09 AM Donna Davis wrote: Reason for CRM: Patient is in the hospital and wants Dr. Karna to take a look at what's going on with her heart

## 2024-04-21 NOTE — Progress Notes (Signed)
 Mobility Specialist Progress Note:   04/21/24 1200  Mobility  Activity Ambulated independently  Level of Assistance Independent  Assistive Device None  Distance Ambulated (ft) 960 ft  Activity Response Tolerated well  Mobility Referral Yes  Mobility visit 1 Mobility  Mobility Specialist Start Time (ACUTE ONLY) 1200  Mobility Specialist Stop Time (ACUTE ONLY) 1220  Mobility Specialist Time Calculation (min) (ACUTE ONLY) 20 min   Pt received in room, eager for mobility session. Ambulated in hallway, independently. ModI with IV pole, not required. HR in 70's throughout session. Tolerated well, asx throughout. Returned pt room, all needs met.    Amoree Newlon Mobility Specialist Please contact via Special educational needs teacher or  Rehab office at [phone]

## 2024-04-21 NOTE — Progress Notes (Signed)
 PHARMACY - ANTICOAGULATION CONSULT NOTE  Pharmacy Consult for Heparin  Indication: 3v CAD  Allergies  Allergen Reactions   Onion Anaphylaxis   Other Other (See Comments)    Ossmo Prep - drop in BP Steroids Went crazy   Shellfish Allergy Anaphylaxis and Swelling    *pt denies    Amitriptyline  Anxiety, Palpitations and Other (See Comments)    hallucinations   Amoxicillin -Pot Clavulanate Nausea And Vomiting    With high dose, low dose ok   Amoxicillin -Pot Clavulanate Nausea And Vomiting    With high dose, low dose ok   Cefuroxime Nausea And Vomiting   Cephalexin  Other (See Comments)     gi upset  gi upset   Ciprofloxacin  Other (See Comments)     Fevers   Clarithromycin Nausea And Vomiting and Other (See Comments)    Pt is OK with azithromycin   gi upset   Clarithromycin Nausea And Vomiting    Pt is OK with azithromycin    Doxycycline  Other (See Comments) and Hives     gi upset  gi upset   Propranolol  Other (See Comments)    Fatigue and makes her feel crazy, refuses to take.   Sulfa Antibiotics Other (See Comments)    Rash, vaginal blisters   Sulfasalazine Itching    Rash, vaginal blisters Rash, vaginal blisters    Sulfonamide Derivatives Other (See Comments)    vaginal blisters   Bacitracin Hives    Redness, pain at site of application   Isosorbide      Migraine    Ondansetron  Other (See Comments)    CAUSES SEVERE MIGRAINES  Other reaction(s): Headache  CAUSES SEVERE MIGRAINES  CAUSES SEVERE MIGRAINES   Benadryl  [Diphenhydramine  Hcl (Sleep)] Anxiety    Extreme agitation   Shrimp Flavor Agent (Non-Screening) Other (See Comments)    unknown    Patient Measurements: Height: 5' (152.4 cm) Weight: 83.9 kg (184 lb 15.3 oz) IBW/kg (Calculated) : 45.5 HEPARIN  DW (KG): 65  Vital Signs: Temp: 98.6 F (37 C) (08/05 1134) Temp Source: Oral (08/05 1134) BP: 122/65 (08/05 0828) Pulse Rate: 72 (08/05 0828)  Labs: Recent Labs    04/20/24 1831 04/20/24 2024  04/21/24 0452 04/21/24 1127  HGB  --   --  12.7  --   HCT  --   --  38.4  --   PLT  --   --  225  --   HEPARINUNFRC  --   --  0.26* 0.39  TROPONINIHS 5 5  --   --     Estimated Creatinine Clearance: 76.4 mL/min (by C-G formula based on SCr of 0.68 mg/dL).   Medical History: Past Medical History:  Diagnosis Date   Allergic rhinitis due to allergen 12/19/2016   Anxiety    Anxiety state 09/03/2006   Arthralgia of right temporomandibular joint 08/26/2018   Bilateral deafness 01/17/2016   Cardiac murmur 03/19/2024   Chest pain of uncertain etiology 03/19/2024   Clostridium difficile infection    x several times   Deaf    uses sign language   Elevated LDL cholesterol level 04/07/2024   Family history of breast cancer in mother 08/26/2015   Family history of malignant neoplasm of gastrointestinal tract    Frequent PVCs 03/19/2024   Gallstones    GERD 07/01/2006   Qualifier: Diagnosis of   By: Charmayne MD, Arlyss         Hearing difficulty    b/l, 2/2 congenital rubella s/p stapedectomy. Using hearing aid   Hepatic cyst  6 mm on CT done done in 05/2005   Hyperglycemia 04/07/2024   Hypothyroidism    IBS 07/01/2006   Qualifier: Diagnosis of   By: Charmayne MD, Arlyss         Low back pain 10/09/2022   Migraine 12/02/2012   Obesity    Obesity (BMI 35.0-39.9 without comorbidity) 02/18/2014   On hormone replacement therapy 04/07/2024   Pain in thoracic spine 10/09/2022   Palpitations 03/05/2024   PONV (postoperative nausea and vomiting)    sometimes; depends on how long I'm under   Routine adult health maintenance 09/01/2012   MMG - Normal 2014 - she is due and I reminded her to schedule.   Pelvic - hysterectomy, no cervix.  Sees OBGYN  Flu vaccine seasonally.      DEXA - at 53yo      Screening for colon cancer 10/22/2023   Screening mammogram for breast cancer 04/07/2024    Medications:  Medications Prior to Admission  Medication Sig Dispense Refill Last Dose/Taking    acetaminophen  (TYLENOL ) 500 MG tablet Take 1,000 mg by mouth every 6 (six) hours as needed for mild pain (pain score 1-3) or moderate pain (pain score 4-6).   04/19/2024   Ascorbic Acid (VITAMIN C PO) Take 1 tablet by mouth in the morning.   04/19/2024   aspirin  EC 81 MG tablet Take 162 mg by mouth daily. Swallow whole.   04/18/2024   busPIRone  (BUSPAR ) 10 MG tablet TAKE 2 TABLETS BY MOUTH 3 TIMES A DAY 540 tablet 2 04/20/2024 Morning   clonazePAM  (KLONOPIN ) 0.5 MG tablet Take 1 tablet (0.5 mg total) by mouth 2 (two) times daily as needed for anxiety. 60 tablet 4 04/20/2024 Morning   Docusate Calcium  (STOOL SOFTENER PO) Take 1 tablet by mouth 2 (two) times daily as needed.   Unknown   estradiol  (ESTRACE ) 1 MG tablet TAKE 1 TABLET BY MOUTH EVERY DAY (Patient taking differently: Take 1 mg by mouth in the morning.) 90 tablet 3 04/20/2024   fluticasone  (FLONASE ) 50 MCG/ACT nasal spray SPRAY 1 SPRAY INTO EACH NOSTRIL EVERY DAY (Patient taking differently: Place 1 spray into both nostrils in the morning.) 16 mL 2 04/20/2024   ibuprofen  (ADVIL ) 200 MG tablet Take 400 mg by mouth every 6 (six) hours as needed for mild pain (pain score 1-3) or moderate pain (pain score 4-6).   Past Month   levothyroxine  (SYNTHROID ) 25 MCG tablet TAKE 1 TABLET BY MOUTH EVERY DAY (Patient taking differently: Take 25 mcg by mouth in the morning.) 90 tablet 3 04/20/2024   pantoprazole  (PROTONIX ) 40 MG tablet TAKE 1 TABLET BY MOUTH EVERY DAY 90 tablet 3 04/19/2024   promethazine  (PHENERGAN ) 12.5 MG tablet Take 1 tablet (12.5 mg total) by mouth every 6 (six) hours as needed for nausea or vomiting. 30 tablet 0 04/19/2024   traMADol  (ULTRAM ) 50 MG tablet TAKE 1 TABLET BY MOUTH EVERY 6 HOURS AS NEEDED FOR SEVERE PAIN 40 tablet 0 04/19/2024   isosorbide  mononitrate (IMDUR ) 30 MG 24 hr tablet Take 1 tablet (30 mg total) by mouth daily. (Patient not taking: Reported on 04/20/2024) 30 tablet 0 Not Taking   metoprolol  succinate (TOPROL  XL) 25 MG 24 hr tablet Take 1  tablet (25 mg total) by mouth daily. (Patient not taking: Reported on 04/20/2024) 90 tablet 3 Not Taking    Assessment: 53 y.o. F s/p cath which showed 3v CAD - evaluating for CABG vs PCI. Pharmacy dosing heparin .   -heparin  level= 0.39 on 1100  units/hr  Goal of Therapy:  Heparin  level 0.3-0.7 units/ml Monitor platelets by anticoagulation protocol: Yes   Plan:  -Continue heparin  1100 units/hr -Daily heparin  level and CBC  Prentice Poisson, PharmD Clinical Pharmacist **Pharmacist phone directory can now be found on [email or web address] ([email or web address]).  Listed under [facility].

## 2024-04-21 NOTE — Progress Notes (Signed)
 PHARMACY - ANTICOAGULATION CONSULT NOTE  Pharmacy Consult for heparin  Indication: CAD awaiting possible CABG  Labs: Recent Labs    04/20/24 1831 04/20/24 2024 04/21/24 0452  HGB  --   --  12.7  HCT  --   --  38.4  PLT  --   --  225  HEPARINUNFRC  --   --  0.26*  TROPONINIHS 5 5  --    Assessment: 53yo female subtherapeutic on heparin  with initial dosing post-cath; no infusion issues or signs of bleeding per RN.  Goal of Therapy:  Heparin  level 0.3-0.7 units/ml   Plan:  Increase heparin  infusion by ~1 unit/kg/hr to 1100 units/hr. Check level in 6 hours.   Donna Davis, PharmD, BCPS 04/21/2024 5:27 AM

## 2024-04-21 NOTE — H&P (View-Only) (Signed)
 Progress Note  Patient Name: Donna Davis Date of Encounter: 04/21/2024 [facility] Cardiologist: None   Interval Summary    Patient reports that she has continued to have chest pain since her cath. Pain feels like a vice grip on her heart. Worsens a bit with deep inhalation. Also worsens when she has palpitations. Her and her husband had questions about surgery, which I was unable to answer. Sign language interpreter at bedside   Vital Signs Vitals:   04/20/24 1942 04/20/24 2353 04/21/24 0414 04/21/24 0828  BP: 118/74 117/81 109/76 122/65  Pulse: 72 62 65 72  Resp: 17 18 18 20   Temp:  98.2 F (36.8 C) 98 F (36.7 C) 98.2 F (36.8 C)  TempSrc: Oral Oral Oral Oral  SpO2: 96% 96% 97% 97%  Weight:      Height:        Intake/Output Summary (Last 24 hours) at 04/21/2024 0855 Last data filed at 04/21/2024 0830 Gross per 24 hour  Intake 1104.89 ml  Output 0 ml  Net 1104.89 ml      04/20/2024    4:09 PM 04/20/2024   10:46 AM 04/17/2024    2:20 PM  Last 3 Weights  Weight (lbs) 184 lb 15.3 oz 185 lb 186 lb 3.2 oz  Weight (kg) 83.895 kg 83.915 kg 84.46 kg      Telemetry/ECG  NSR - Personally Reviewed  Physical Exam  GEN: No acute distress.  Sitting upright in the bed  Neck: No JVD Cardiac:  RRR, no murmurs, rubs, or gallops.  Respiratory: Clear to auscultation bilaterally. Normal WOB on room air  GI: Soft, nontender, non-distended  MS: No edema in BLE   Assessment & Plan   CAD  Chest pain - Patient underwent outpatient cardiac catheterization yesterday, found to have significant three-vessel CAD including diffuse mid LAD disease up to 50-60%, 90% distal left circumflex lesion, sequential 60-80% proximal/mid RCA stenoses - Prior to cath, patient had ongoing chest pain for 3 days.  Recommended admission for CT surgery evaluation - Seen by CT surgery APP yesterday, Dr. Shyrl to determine surgical candidacy and timing.  Note not much availability this week -  Yesterday afternoon, patient continued to have 10/10 chest pain following cath.  Has as needed morphine  ordered and is on amlodipine  2.5 mg daily.  Patient unable to tolerate Imdur  due to severe migraine headaches - Troponin negative x 2 yesterday - Per Dr. Mady, if patient has refractory chest pain, consider expedited CABG versus PCI to left circumflex and RCA.  - Patient continues to have chest pain this AM. Constant. Worse with deep inhalation or palpitations  - Continue IV heparin  - Increase metoprolol  succinate to 50 mg daily  - Start Aspirin  81 mg daily - Lipid panel, A1c ordered for tomorrow AM   HLD  - Lipid panel this admission pending - With multivessel CAD, start Crestor  20 mg daily  Hypothyroidism  - Continue home synthroid    For questions or updates, please contact [facility] Please consult [email or web address] for contact info under       Signed, Rollo FABIENE Louder, PA-C   Patient seen, examined. Available data reviewed. Agree with findings, assessment, and plan as outlined by Rollo Louder, PA-C.  My evaluation is conducted in the presence of a sign language interpreter.  The patient's husband is present as well.  The patient continues to have near constant chest pain that is worse with exertion.  Her cardiac biomarkers have been consistently normal.  Her EKG shows no acute ischemic changes.  She is on good medical therapy with a beta-blocker and long-acting nitrate.  I reviewed the patient's cardiac catheterization films.  She has multivessel disease with severe left circumflex and RCA stenoses and moderate diffuse LAD stenosis.  After review of her cath films and discussion of treatment options, I discussed the pros and cons of complete revascularization with cardiac surgery versus PCI focused on the most critical lesions.  I think at age 53 with normal LV function and an absence of diabetes, it is appropriate to treat the severe stenoses in the RCA and left circumflex  and manage her LAD disease medically.  This would not burn any bridges for future bypass surgery and I suspect she will have complete relief of her anginal symptoms with treatment of her severe lesions which are both pretty focal and treatable with stenting.  This is also discussed with the cardiac surgical team who agrees with our plan.  The patient will be loaded with prasugrel  60 mg today and 10 mg daily starting tomorrow.  Will plan for her to undergo PCI of the left circumflex and right coronary arteries with Dr. Darron. I have reviewed the risks, indications, and alternatives to cardiac catheterization, possible angioplasty, and stenting with the patient. Risks include but are not limited to bleeding, infection, vascular injury, stroke, myocardial infection, arrhythmia, kidney injury, radiation-related injury in the case of prolonged fluoroscopy use, emergency cardiac surgery, and death. The patient understands the risks of serious complication is 1-2 in 1000 with diagnostic cardiac cath and 1-2% or less with angioplasty/stenting.  Otherwise, as outlined above.  Ozell Fell, M.D. 04/21/2024 1:27 PM

## 2024-04-22 ENCOUNTER — Encounter (HOSPITAL_COMMUNITY): Payer: Self-pay | Admitting: Cardiovascular Disease

## 2024-04-22 ENCOUNTER — Other Ambulatory Visit (HOSPITAL_COMMUNITY): Payer: Self-pay

## 2024-04-22 ENCOUNTER — Inpatient Hospital Stay (HOSPITAL_COMMUNITY)
Admission: AD | Disposition: A | Discharge status: Home / Self Care | Payer: Self-pay | Source: Ambulatory Visit | Attending: Internal Medicine

## 2024-04-22 ENCOUNTER — Encounter: Payer: Self-pay | Admitting: Internal Medicine

## 2024-04-22 ENCOUNTER — Telehealth (HOSPITAL_COMMUNITY): Payer: Self-pay | Admitting: Pharmacy Technician

## 2024-04-22 DIAGNOSIS — R112 Nausea with vomiting, unspecified: Secondary | ICD-10-CM

## 2024-04-22 DIAGNOSIS — G43719 Chronic migraine without aura, intractable, without status migrainosus: Secondary | ICD-10-CM

## 2024-04-22 DIAGNOSIS — I25110 Atherosclerotic heart disease of native coronary artery with unstable angina pectoris: Principal | ICD-10-CM

## 2024-04-22 HISTORY — PX: CORONARY ULTRASOUND/IVUS: CATH118244

## 2024-04-22 HISTORY — PX: CORONARY STENT INTERVENTION: CATH118234

## 2024-04-22 LAB — LIPID PANEL
Cholesterol: 185 mg/dL (ref 0–200)
HDL: 52 mg/dL (ref >40)
LDL Cholesterol: 109 mg/dL — ABNORMAL HIGH (ref 0–99)
Total CHOL/HDL Ratio: 3.6 ratio
Triglycerides: 122 mg/dL (ref <150)
VLDL: 24 mg/dL (ref 0–40)

## 2024-04-22 LAB — BASIC METABOLIC PANEL WITH GFR
Anion gap: 10 (ref 5–15)
BUN: 8 mg/dL (ref 6–20)
CO2: 22 mmol/L (ref 22–32)
Calcium: 9.1 mg/dL (ref 8.9–10.3)
Chloride: 106 mmol/L (ref 98–111)
Creatinine, Ser: 0.75 mg/dL (ref 0.44–1.00)
GFR, Estimated: >60 mL/min (ref >60)
Glucose, Bld: 94 mg/dL (ref 70–99)
Potassium: 3.7 mmol/L (ref 3.5–5.1)
Sodium: 138 mmol/L (ref 135–145)

## 2024-04-22 LAB — CBC
HCT: 39.2 % (ref 36.0–46.0)
Hemoglobin: 13 g/dL (ref 12.0–15.0)
MCH: 31 pg (ref 26.0–34.0)
MCHC: 33.2 g/dL (ref 30.0–36.0)
MCV: 93.3 fL (ref 80.0–100.0)
Platelets: 256 K/uL (ref 150–400)
RBC: 4.2 MIL/uL (ref 3.87–5.11)
RDW: 11.7 % (ref 11.5–15.5)
WBC: 8.9 K/uL (ref 4.0–10.5)
nRBC: 0 % (ref 0.0–0.2)

## 2024-04-22 LAB — POCT ACTIVATED CLOTTING TIME
Activated Clotting Time: 412 s
Activated Clotting Time: 607 s
Activated Clotting Time: 320 s

## 2024-04-22 LAB — LIPOPROTEIN A (LPA): Lipoprotein (a): 83.2 nmol/L — ABNORMAL HIGH (ref <75.0)

## 2024-04-22 LAB — HEPARIN LEVEL (UNFRACTIONATED): Heparin Unfractionated: 0.55 [IU]/mL (ref 0.30–0.70)

## 2024-04-22 SURGERY — CORONARY STENT INTERVENTION
Anesthesia: LOCAL

## 2024-04-22 MED ORDER — LIDOCAINE HCL (PF) 1 % IJ SOLN
INTRAMUSCULAR | Status: DC | PRN
  Administered 2024-04-22: 2 mL

## 2024-04-22 MED ORDER — FREE WATER
500.0000 mL | Freq: Once | Status: AC
  Administered 2024-04-22: 500 mL via ORAL

## 2024-04-22 MED ORDER — HEPARIN (PORCINE) IN NACL 1000-0.9 UT/500ML-% IV SOLN
INTRAVENOUS | Status: DC | PRN
  Administered 2024-04-22 (×3): 500 mL

## 2024-04-22 MED ORDER — ASPIRIN 81 MG PO CHEW
81.0000 mg | CHEWABLE_TABLET | ORAL | Status: AC
  Administered 2024-04-22: 81 mg via ORAL
  Filled 2024-04-22: qty 1

## 2024-04-22 MED ORDER — HEPARIN SODIUM (PORCINE) 1000 UNIT/ML IJ SOLN
INTRAMUSCULAR | Status: DC | PRN
  Administered 2024-04-22: 8000 [IU] via INTRAVENOUS

## 2024-04-22 MED ORDER — IOHEXOL 350 MG/ML SOLN
INTRAVENOUS | Status: DC | PRN
  Administered 2024-04-22: 150 mL

## 2024-04-22 MED ORDER — ENOXAPARIN SODIUM 40 MG/0.4ML IJ SOSY
40.0000 mg | PREFILLED_SYRINGE | INTRAMUSCULAR | Status: DC
  Administered 2024-04-23: 40 mg via SUBCUTANEOUS
  Filled 2024-04-22: qty 0.4

## 2024-04-22 MED ORDER — MIDAZOLAM HCL 2 MG/2ML IJ SOLN
INTRAMUSCULAR | Status: DC | PRN
Start: 2024-04-22 — End: 2024-04-22
  Administered 2024-04-22 (×4): 1 mg via INTRAVENOUS

## 2024-04-22 MED ORDER — VERAPAMIL HCL 2.5 MG/ML IV SOLN
INTRAVENOUS | Status: DC | PRN
  Administered 2024-04-22: 10 mL via INTRA_ARTERIAL

## 2024-04-22 MED ORDER — SODIUM CHLORIDE 0.9% FLUSH
3.0000 mL | Freq: Two times a day (BID) | INTRAVENOUS | Status: DC
  Administered 2024-04-22 – 2024-04-23 (×3): 3 mL via INTRAVENOUS

## 2024-04-22 MED ORDER — FENTANYL CITRATE (PF) 100 MCG/2ML IJ SOLN
INTRAMUSCULAR | Status: DC | PRN
  Administered 2024-04-22 (×3): 25 ug via INTRAVENOUS

## 2024-04-22 MED ORDER — SODIUM CHLORIDE 0.9 % IV SOLN
250.0000 mL | INTRAVENOUS | Status: DC | PRN
Start: 2024-04-22 — End: 2024-04-23

## 2024-04-22 MED ORDER — MIDAZOLAM HCL 2 MG/2ML IJ SOLN
INTRAMUSCULAR | Status: AC
  Filled 2024-04-22: qty 2

## 2024-04-22 MED ORDER — NITROGLYCERIN 1 MG/10 ML FOR IR/CATH LAB
INTRA_ARTERIAL | Status: AC
  Filled 2024-04-22: qty 10

## 2024-04-22 MED ORDER — LIDOCAINE HCL (PF) 1 % IJ SOLN
INTRAMUSCULAR | Status: AC
Start: 2024-04-22 — End: 2024-04-22
  Filled 2024-04-22: qty 30

## 2024-04-22 MED ORDER — SODIUM CHLORIDE 0.9% FLUSH: 3.0000 mL | INTRAVENOUS | Status: DC | PRN

## 2024-04-22 MED ORDER — FENTANYL CITRATE (PF) 100 MCG/2ML IJ SOLN
INTRAMUSCULAR | Status: AC
  Filled 2024-04-22: qty 2

## 2024-04-22 SURGICAL SUPPLY — 18 items
BALLOON EMERGE MR 2.5X12 (BALLOONS) IMPLANT
BALLOON SAPPHIRE NC24 2.75X18 (BALLOONS) IMPLANT
BALLOON SAPPHIRE NC24 3.0X26 (BALLOONS) IMPLANT
CATH LAUNCHER 6FR EBU3.5 (CATHETERS) IMPLANT
CATH OPTICROSS HD (CATHETERS) IMPLANT
CATH VISTA GUIDE 6FR JR4 ECOPK (CATHETERS) IMPLANT
DEVICE RAD COMP TR BAND LRG (VASCULAR PRODUCTS) IMPLANT
DRAPE IVUS SLED (BAG) IMPLANT
GLIDESHEATH SLEND SS 6F .021 (SHEATH) IMPLANT
GUIDEWIRE INQWIRE 1.5J.035X260 (WIRE) IMPLANT
KIT ENCORE 26 ADVANTAGE (KITS) IMPLANT
KIT SYRINGE INJ CVI SPIKEX1 (MISCELLANEOUS) IMPLANT
PACK CARDIAC CATHETERIZATION (CUSTOM PROCEDURE TRAY) ×1 IMPLANT
SET ATX-X65L (MISCELLANEOUS) IMPLANT
STENT SYNERGY XD 2.50X24 (Permanent Stent) IMPLANT
STENT SYNERGY XD 2.50X48 (Permanent Stent) IMPLANT
TUBING CIL FLEX 10 FLL-RA (TUBING) IMPLANT
WIRE RUNTHROUGH .014X180CM (WIRE) IMPLANT

## 2024-04-22 NOTE — Telephone Encounter (Signed)
 Patient Product/process development scientist completed.    The patient is insured through [facility] Patient has [employer], may use a copay card, and/or apply for patient assistance if available.    Ran test claim for prasugrel (Effient) 10 mg and the current 30 day co-pay is $5.00.   This test claim was processed through [facility]- copay amounts may vary at other pharmacies due to pharmacy/plan contracts, or as the patient moves through the different stages of their insurance plan.     Roland Earl, CPHT Pharmacy Technician III Certified Patient Advocate [facility] Patient Advocate Team Direct Number: [phone]  Fax: [phone]

## 2024-04-22 NOTE — Discharge Instructions (Signed)
 Information about your medication: Effient (anti-platelet agent)  Generic Name (Brand): prasugrel (Effient), once daily medication  PURPOSE: You are taking this medication along with aspirin to lower your chance of having a heart attack, stroke, or blood clots in your heart stent. These can be fatal. Effient and aspirin help prevent platelets from sticking together and forming a clot that can block an artery or your stent.   Common SIDE EFFECTS you may experience include: bruising or bleeding more easily, shortness of breath  Do not stop taking EFFIENT without talking to the doctor who prescribes it for you. People who are treated with a stent and stop taking Effient too soon, have a higher risk of getting a blood clot in the stent, having a heart attack, or dying. If you stop Effient because of bleeding, or for other reasons, your risk of a heart attack or stroke may increase.   Avoid taking NSAID agents or anti-inflammatory medications such as ibuprofen, naproxen given increased bleed risk with Effient - can use acetaminophen (Tylenol) if needed for pain.  Tell all of your doctors and dentists that you are taking Effient. They should talk to the doctor who prescribed Effient for you before you have any surgery or invasive procedure.   Contact your health care provider if you experience: severe or uncontrollable bleeding, pink/red/Murry urine, vomiting blood or vomit that looks like "coffee grounds", red or black stools (looks like tar), coughing up blood or blood clots ----------------------------------------------------------------------------------------------------------------------

## 2024-04-22 NOTE — Progress Notes (Addendum)
  Progress Note  Patient Name: Donna Davis [place] Date of Encounter: 04/22/2024 [facility] Cardiologist: None    Interval Summary    Patient scheduled for PCI today. She is a bit nervous about the procedure, but she is glad that she does not have to go through surgery. Continues to have some chest pain. Feeling OK otherwise   Vital Signs Vitals:   04/21/24 2036 04/21/24 2336 04/22/24 0341 04/22/24 0859  BP: (!) 116/58 112/66 109/77   Pulse: 65 67 60   Resp: 20 18 17 13   Temp: 98 F (36.7 C) 98.3 F (36.8 C) 97.9 F (36.6 C) 98 F (36.7 C)  TempSrc: Oral Oral Oral Oral  SpO2: 99% 96% 96%   Weight:      Height:        Intake/Output Summary (Last 24 hours) at 04/22/2024 0918 Last data filed at 04/22/2024 9095 Gross per 24 hour  Intake 1298.63 ml  Output 0 ml  Net 1298.63 ml      04/20/2024    4:09 PM 04/20/2024   10:46 AM 04/17/2024    2:20 PM  Last 3 Weights  Weight (lbs) 184 lb 15.3 oz 185 lb 186 lb 3.2 oz  Weight (kg) 83.895 kg 83.915 kg 84.46 kg      Telemetry/ECG  NSR - Personally Reviewed  Physical Exam  GEN: No acute distress.  Sitting comfortably in the bed  Neck: No JVD Cardiac:  RRR, no murmurs, rubs, or gallops.  Respiratory: Clear to auscultation bilaterally. Normal WOB on room air  GI: Soft, nontender, non-distended  MS: No edema in BLE   Assessment & Plan  CAD  Chest pain - Patient underwent outpatient cardiac catheterization 8/4, found to have significant three-vessel CAD including diffuse mid LAD disease up to 50-60%, 90% distal left circumflex lesion, sequential 60-80% proximal/mid RCA stenoses - Prior to cath, patient had ongoing chest pain for 3 days.  Recommended admission for CT surgery evaluation. Ultimately, decision was made to manage with PCI  - Scheduled for PCI today with Dr. Darron.  - Continue IV heparin  - Increase metoprolol  succinate to 50 mg daily  - Continue ASA 81 mg daily  - Continue effient  10 mg daily (was loaded with 60 mg  yesterday)  - Continue crestor  20 mg daily    HLD  - Lipid panel this admission with LDL 109  - Continue Crestor  20 mg daily    Hypothyroidism  - Continue home synthroid    For questions or updates, please contact [facility] Please consult [email or web address] for contact info under       Signed, Rollo FABIENE Louder, PA-C    Patient seen, examined. Available data reviewed. Agree with findings, assessment, and plan as outlined by Rollo Louder, PA-C. Pt is independently interviewed and examined. TR band in place with no hematoma. Heart RRR no murmur, lungs CTA, no lower extremity edema. Pt now s/p 2 vessel PCI with good result on the RCA and LCx. Continue ASA, prasugrel , rosuvastatin , metoprolol  succinate. Anticipate DC tomorrow if no complications arise. Husband, son, and sign Presenter, broadcasting at bedside.   Ozell Fell, M.D. 04/22/2024 2:35 PM

## 2024-04-22 NOTE — Interval H&P Note (Signed)
 History and Physical Interval Note:  04/22/2024 10:32 AM  Donna Davis  has presented today for surgery, with the diagnosis of cad.  The various methods of treatment have been discussed with the patient and family. After consideration of risks, benefits and other options for treatment, the patient has consented to  Procedure(s): CORONARY STENT INTERVENTION (N/A) as a surgical intervention.  The patient's history has been reviewed, patient examined, no change in status, stable for surgery.  I have reviewed the patient's chart and labs.  Questions were answered to the patient's satisfaction.     Reynald Woods

## 2024-04-22 NOTE — TOC CM/SW Note (Signed)
 Transition of Care [facility]) - Inpatient Brief Assessment   Patient Details  Name: Donna Davis MRN: [id number] Date of Birth: Feb 23, 1971  Transition of Care [facility]) CM/SW Contact:    Lauraine FORBES Saa, LCSWA Phone Number: 04/22/2024, 3:06 PM   Clinical Narrative:  3:06 PM Per chart review, patient resides at home with spouse. Patient has a PCP and insurance. Patient does not have SNF or HH history. Patient has a walker. Patient's preferred pharmacy's are [facility], [facility] [address] and [address] [facility] [address]. No TOC needs were identified at this time. TOC will continue to follow and be available to assist.  Transition of Care Asessment: Insurance and Status: Insurance coverage has been reviewed Patient has primary care physician: Yes Home environment has been reviewed: Private Residence Prior level of function:: N/A Prior/Current Home Services: No current home services Social Drivers of Health Review: SDOH reviewed no interventions necessary Readmission risk has been reviewed: Yes Transition of care needs: no transition of care needs at this time

## 2024-04-22 NOTE — Plan of Care (Signed)

## 2024-04-22 NOTE — Plan of Care (Signed)
  Problem: Education: Goal: Understanding of CV disease, CV risk reduction, and recovery process will improve Outcome: Progressing Goal: Individualized Educational Video(s) Outcome: Progressing   Problem: Activity: Goal: Ability to return to baseline activity level will improve Outcome: Progressing   Problem: Cardiovascular: Goal: Ability to achieve and maintain adequate cardiovascular perfusion will improve Outcome: Progressing Goal: Vascular access site(s) Level 0-1 will be maintained Outcome: Progressing   Problem: Health Behavior/Discharge Planning: Goal: Ability to safely manage health-related needs after discharge will improve Outcome: Progressing   Problem: Education: Goal: Knowledge of General Education information will improve Description: Including pain rating scale, medication(s)/side effects and non-pharmacologic comfort measures Outcome: Progressing   Problem: Health Behavior/Discharge Planning: Goal: Ability to manage health-related needs will improve Outcome: Progressing   Problem: Clinical Measurements: Goal: Ability to maintain clinical measurements within normal limits will improve Outcome: Progressing Goal: Will remain free from infection Outcome: Progressing Goal: Diagnostic test results will improve Outcome: Progressing Goal: Respiratory complications will improve Outcome: Progressing Goal: Cardiovascular complication will be avoided Outcome: Progressing   Problem: Activity: Goal: Risk for activity intolerance will decrease Outcome: Progressing   Problem: Nutrition: Goal: Adequate nutrition will be maintained Outcome: Progressing   Problem: Coping: Goal: Level of anxiety will decrease Outcome: Progressing   Problem: Elimination: Goal: Will not experience complications related to bowel motility Outcome: Progressing Goal: Will not experience complications related to urinary retention Outcome: Progressing   Problem: Pain Managment: Goal:  General experience of comfort will improve and/or be controlled Outcome: Progressing   Problem: Safety: Goal: Ability to remain free from injury will improve Outcome: Progressing

## 2024-04-22 NOTE — Plan of Care (Signed)

## 2024-04-22 NOTE — Progress Notes (Signed)
 PHARMACY - ANTICOAGULATION CONSULT NOTE  Pharmacy Consult for Heparin  Indication: 3v CAD  Allergies  Allergen Reactions   Onion Anaphylaxis   Other Other (See Comments)    [place] Prep - drop in BP Steroids Went crazy   Shellfish Allergy Anaphylaxis and Swelling    *pt denies    Amitriptyline  Anxiety, Palpitations and Other (See Comments)    hallucinations   Amoxicillin -Pot Clavulanate Nausea And Vomiting    With high dose, low dose ok   Amoxicillin -Pot Clavulanate Nausea And Vomiting    With high dose, low dose ok   Cefuroxime Nausea And Vomiting   Cephalexin  Other (See Comments)     gi upset  gi upset   Ciprofloxacin  Other (See Comments)     Fevers   Clarithromycin Nausea And Vomiting and Other (See Comments)    Pt is OK with azithromycin   gi upset   Clarithromycin Nausea And Vomiting    Pt is OK with azithromycin    Doxycycline  Other (See Comments) and Hives     gi upset  gi upset   Propranolol  Other (See Comments)    Fatigue and makes her feel crazy, refuses to take.   Sulfa Antibiotics Other (See Comments)    Rash, vaginal blisters   Sulfasalazine Itching    Rash, vaginal blisters Rash, vaginal blisters    Sulfonamide Derivatives Other (See Comments)    vaginal blisters   Bacitracin Hives    Redness, pain at site of application   Isosorbide      Migraine    Ondansetron  Other (See Comments)    CAUSES SEVERE MIGRAINES  Other reaction(s): Headache  CAUSES SEVERE MIGRAINES  CAUSES SEVERE MIGRAINES   Benadryl  [Diphenhydramine  Hcl (Sleep)] Anxiety    Extreme agitation   Shrimp Flavor Agent (Non-Screening) Other (See Comments)    unknown    Patient Measurements: Height: 5' (152.4 cm) Weight: 83.9 kg (184 lb 15.3 oz) IBW/kg (Calculated) : 45.5 HEPARIN  DW (KG): 65  Vital Signs: Temp: 98 F (36.7 C) (08/06 0859) Temp Source: Oral (08/06 0859) BP: 109/77 (08/06 0341) Pulse Rate: 60 (08/06 0341)  Labs: Recent Labs    04/20/24 1831 04/20/24 2024  04/21/24 0452 04/21/24 1127 04/22/24 0331  HGB  --   --  12.7  --  13.0  HCT  --   --  38.4  --  39.2  PLT  --   --  225  --  256  HEPARINUNFRC  --   --  0.26* 0.39 0.55  TROPONINIHS 5 5  --   --   --     Estimated Creatinine Clearance: 76.4 mL/min (by C-G formula based on SCr of 0.68 mg/dL).   Medical History: Past Medical History:  Diagnosis Date   Allergic rhinitis due to allergen 12/19/2016   Anxiety    Anxiety state 09/03/2006   Arthralgia of right temporomandibular joint 08/26/2018   Bilateral deafness 01/17/2016   Cardiac murmur 03/19/2024   Chest pain of uncertain etiology 03/19/2024   Clostridium difficile infection    x several times   Deaf    uses sign language   Elevated LDL cholesterol level 04/07/2024   Family history of breast cancer in mother 08/26/2015   Family history of malignant neoplasm of gastrointestinal tract    Frequent PVCs 03/19/2024   Gallstones    GERD 07/01/2006   Qualifier: Diagnosis of   By: Charmayne MD, Arlyss         Hearing difficulty    b/l, 2/2 congenital rubella s/p stapedectomy.  Using hearing aid   Hepatic cyst    6 mm on CT done done in 05/2005   Hyperglycemia 04/07/2024   Hypothyroidism    IBS 07/01/2006   Qualifier: Diagnosis of   By: Charmayne MD, Arlyss         Low back pain 10/09/2022   Migraine 12/02/2012   Obesity    Obesity (BMI 35.0-39.9 without comorbidity) 02/18/2014   On hormone replacement therapy 04/07/2024   Pain in thoracic spine 10/09/2022   Palpitations 03/05/2024   PONV (postoperative nausea and vomiting)    sometimes; depends on how long I'm under   Routine adult health maintenance 09/01/2012   MMG - Normal 2014 - she is due and I reminded her to schedule.   Pelvic - hysterectomy, no cervix.  Sees OBGYN  Flu vaccine seasonally.      DEXA - at 53yo      Screening for colon cancer 10/22/2023   Screening mammogram for breast cancer 04/07/2024    Medications:  Medications Prior to Admission  Medication Sig  Dispense Refill Last Dose/Taking   acetaminophen  (TYLENOL ) 500 MG tablet Take 1,000 mg by mouth every 6 (six) hours as needed for mild pain (pain score 1-3) or moderate pain (pain score 4-6).   04/19/2024   Ascorbic Acid (VITAMIN C PO) Take 1 tablet by mouth in the morning.   04/19/2024   aspirin  EC 81 MG tablet Take 162 mg by mouth daily. Swallow whole.   04/18/2024   busPIRone  (BUSPAR ) 10 MG tablet TAKE 2 TABLETS BY MOUTH 3 TIMES A DAY 540 tablet 2 04/20/2024 Morning   clonazePAM  (KLONOPIN ) 0.5 MG tablet Take 1 tablet (0.5 mg total) by mouth 2 (two) times daily as needed for anxiety. 60 tablet 4 04/20/2024 Morning   Docusate Calcium  (STOOL SOFTENER PO) Take 1 tablet by mouth 2 (two) times daily as needed.   Unknown   estradiol  (ESTRACE ) 1 MG tablet TAKE 1 TABLET BY MOUTH EVERY DAY (Patient taking differently: Take 1 mg by mouth in the morning.) 90 tablet 3 04/20/2024   fluticasone  (FLONASE ) 50 MCG/ACT nasal spray SPRAY 1 SPRAY INTO EACH NOSTRIL EVERY DAY (Patient taking differently: Place 1 spray into both nostrils in the morning.) 16 mL 2 04/20/2024   ibuprofen  (ADVIL ) 200 MG tablet Take 400 mg by mouth every 6 (six) hours as needed for mild pain (pain score 1-3) or moderate pain (pain score 4-6).   Past Month   levothyroxine  (SYNTHROID ) 25 MCG tablet TAKE 1 TABLET BY MOUTH EVERY DAY (Patient taking differently: Take 25 mcg by mouth in the morning.) 90 tablet 3 04/20/2024   pantoprazole  (PROTONIX ) 40 MG tablet TAKE 1 TABLET BY MOUTH EVERY DAY 90 tablet 3 04/19/2024   promethazine  (PHENERGAN ) 12.5 MG tablet Take 1 tablet (12.5 mg total) by mouth every 6 (six) hours as needed for nausea or vomiting. 30 tablet 0 04/19/2024   traMADol  (ULTRAM ) 50 MG tablet TAKE 1 TABLET BY MOUTH EVERY 6 HOURS AS NEEDED FOR SEVERE PAIN 40 tablet 0 04/19/2024   isosorbide  mononitrate (IMDUR ) 30 MG 24 hr tablet Take 1 tablet (30 mg total) by mouth daily. (Patient not taking: Reported on 04/20/2024) 30 tablet 0 Not Taking   metoprolol  succinate  (TOPROL  XL) 25 MG 24 hr tablet Take 1 tablet (25 mg total) by mouth daily. (Patient not taking: Reported on 04/20/2024) 90 tablet 3 Not Taking    Assessment: 53 y.o. F s/p cath which showed 3v CAD. Pharmacy dosing heparin .   -heparin   level= 0.55 on 1100 units/hr -plans for cath today  Goal of Therapy:  Heparin  level 0.3-0.7 units/ml Monitor platelets by anticoagulation protocol: Yes   Plan:  -Continue heparin  1100 units/hr -Will follow plans post cath  Prentice Poisson, PharmD Clinical Pharmacist **Pharmacist phone directory can now be found on [email or web address] ([email or web address]).  Listed under [facility].

## 2024-04-23 ENCOUNTER — Other Ambulatory Visit (HOSPITAL_COMMUNITY): Payer: Self-pay

## 2024-04-23 DIAGNOSIS — I2510 Atherosclerotic heart disease of native coronary artery without angina pectoris: Secondary | ICD-10-CM | POA: Insufficient documentation

## 2024-04-23 LAB — BASIC METABOLIC PANEL WITH GFR
Anion gap: 10 (ref 5–15)
BUN: 7 mg/dL (ref 6–20)
CO2: 24 mmol/L (ref 22–32)
Calcium: 9.1 mg/dL (ref 8.9–10.3)
Chloride: 101 mmol/L (ref 98–111)
Creatinine, Ser: 0.77 mg/dL (ref 0.44–1.00)
GFR, Estimated: >60 mL/min (ref >60)
Glucose, Bld: 88 mg/dL (ref 70–99)
Potassium: 3.5 mmol/L (ref 3.5–5.1)
Sodium: 135 mmol/L (ref 135–145)

## 2024-04-23 LAB — CBC
HCT: 38.2 % (ref 36.0–46.0)
Hemoglobin: 12.8 g/dL (ref 12.0–15.0)
MCH: 30.7 pg (ref 26.0–34.0)
MCHC: 33.5 g/dL (ref 30.0–36.0)
MCV: 91.6 fL (ref 80.0–100.0)
Platelets: 253 K/uL (ref 150–400)
RBC: 4.17 MIL/uL (ref 3.87–5.11)
RDW: 11.5 % (ref 11.5–15.5)
WBC: 8.3 K/uL (ref 4.0–10.5)
nRBC: 0 % (ref 0.0–0.2)

## 2024-04-23 LAB — HEPATIC FUNCTION PANEL
ALT: 13 U/L (ref 0–44)
AST: 20 U/L (ref 15–41)
Albumin: 3.4 g/dL — ABNORMAL LOW (ref 3.5–5.0)
Alkaline Phosphatase: 44 U/L (ref 38–126)
Bilirubin, Direct: <0.1 mg/dL (ref 0.0–0.2)
Total Bilirubin: 0.5 mg/dL (ref 0.0–1.2)
Total Protein: 6.6 g/dL (ref 6.5–8.1)

## 2024-04-23 LAB — HEMOGLOBIN A1C
Hgb A1c MFr Bld: 5.1 % (ref 4.8–5.6)
Mean Plasma Glucose: 100 mg/dL

## 2024-04-23 MED ORDER — NITROGLYCERIN 0.4 MG SL SUBL
0.4000 mg | SUBLINGUAL_TABLET | SUBLINGUAL | 2 refills | Status: AC | PRN
  Filled 2024-04-23: qty 25, 15d supply, fill #0
  Filled 2024-07-02: qty 25, 15d supply, fill #1
  Filled 2024-10-19: qty 25, 15d supply, fill #0

## 2024-04-23 MED ORDER — ROSUVASTATIN CALCIUM 20 MG PO TABS
20.0000 mg | ORAL_TABLET | Freq: Every day | ORAL | 3 refills | Status: DC
  Filled 2024-04-23: qty 30, 30d supply, fill #0

## 2024-04-23 MED ORDER — METOPROLOL SUCCINATE ER 50 MG PO TB24
50.0000 mg | ORAL_TABLET | Freq: Every day | ORAL | 3 refills | Status: DC
  Filled 2024-04-23: qty 30, 30d supply, fill #0

## 2024-04-23 MED ORDER — PRASUGREL HCL 10 MG PO TABS
10.0000 mg | ORAL_TABLET | Freq: Every day | ORAL | 3 refills | Status: DC
  Filled 2024-04-23: qty 30, 30d supply, fill #0

## 2024-04-23 MED ORDER — PROMETHAZINE HCL 12.5 MG PO TABS
12.5000 mg | ORAL_TABLET | Freq: Four times a day (QID) | ORAL | 0 refills | Status: DC | PRN
Start: 2024-04-23 — End: 2024-07-02

## 2024-04-23 MED ORDER — TRAMADOL HCL 50 MG PO TABS: 50.0000 mg | ORAL_TABLET | Freq: Four times a day (QID) | ORAL | 0 refills | Status: DC | PRN

## 2024-04-23 MED ORDER — AMLODIPINE BESYLATE 2.5 MG PO TABS: 2.5000 mg | ORAL_TABLET | Freq: Every day | ORAL | Status: DC

## 2024-04-23 MED FILL — Nitroglycerin IV Soln 100 MCG/ML in D5W: INTRA_ARTERIAL | Qty: 10 | Status: AC

## 2024-04-23 NOTE — Discharge Summary (Addendum)
 Discharge Summary   Patient ID: Donna Davis MRN: [id number]; DOB: [birth year]  Admit date: 04/20/2024 Discharge date: 04/23/2024  PCP:  [name], MD   [facility] Providers Cardiologist:  Dr Edwyna   Discharge Diagnoses  Principal Problem:   Unstable angina [facility]) Active Problems:   CAD (coronary artery disease)   Diagnostic Studies/Procedures    Staged PCI 2024/05/01:    Mid LAD to Dist LAD lesion is 55% stenosed.   Mid RCA lesion is 65% stenosed.   1st Diag lesion is 50% stenosed.   Prox RCA lesion is 75% stenosed.   Dist RCA lesion is 40% stenosed.   Mid Cx to Dist Cx lesion is 90% stenosed.   Mid Cx lesion is 30% stenosed.   A drug-eluting stent was successfully placed using a STENT SYNERGY XD [id number].   A drug-eluting stent was successfully placed using a STENT SYNERGY XD 2.50X24.   Post intervention, there is a 0% residual stenosis.   Post intervention, there is a 0% residual stenosis.   Post intervention, there is a 0% residual stenosis.   1.  Mildly elevated left ventricular end-diastolic pressure at 18 mmHg. 2.  Successful IVUS guided PCI and drug-eluting stent placement to the right coronary artery extending to the ostium.  A 2.5 mm stent was placed and postdilated to a 3.0 proximally. 3.  Successful angioplasty and drug-eluting stent placement to the mid/distal left circumflex.  The stent partially jailed in OM branch which had normal flow and minimal stenosis at the ostium. 4.  The patient required high doses of sedatives and narcotics.   Recommendations: Dual antiplatelet therapy for at least 6 months. Aggressive treatment of risk factors.    Left heart catheterization 04/20/2024:  Conclusions: Significant three-vessel coronary artery disease, as detailed below, including diffuse mid LAD disease of up to 50-60%, 90% distal LCx lesion, and sequential 60-80% proximal/mid RCA stenoses.  LAD, LCx, and RCA lesions are all hemodynamically significant by  virtual FFR. Upper normal left ventricular filling pressure (LVEDP 15 mmHg).   Recommendations: Given ongoing chest pain over the last 3 days, admit for inpatient cardiac surgery consultation and revascularization (CABG versus multivessel PCI).  If the patient is not a good surgical candidate, I would favor PCI to LCx and RCA with medical therapy of LAD given long segment of mid LAD disease. Aggressive secondary prevention of coronary artery disease. Initiate heparin  infusion 2 hours after TR band has been removed.    History of Present Illness   Donna Davis is a 53 y.o. female with PMH of anxiety, hypothyroidism, GERD,  Deafness, migraine, back pain, who was seen by Dr Jeffrie on 04/17/24 as consultation at ER for chest pain.   She had complained intermittent heavy chest pressure over the last several weeks. She was in the office 04/17/24 for a stress echocardiogram and developed some nonspecific ST changes on her stress EKG and perhaps small septal changes on echocardiogram. Intermediate level test. She did have chest discomfort that continued for several minutes following the test and required EMS transportation to the emergency department for further evaluation. Her troponin was normal and EKG was non-ischemic. She was arranged an outpatient LHC on 04/20/24, started on imdur  30mg  daily, and placed on Zio monitor.   She presented to [facility] 04/20/24.    Hospital Course   Consultants: N/A    Unstable angina Coronary artery disease -Seen by cardiology Dr. Jeffrie 04/17/2024 as ER consultation for persistent chest pain for a few  weeks and non-specific stress Echo findings at the office that day, had  negative troponin and EKG, was arranged outpatient left heart catheterization on 04/20/2024 -Stress echo 04/17/2024 with baseline EF 65%, peak EF at stress 70%, no ischemia per report  -LHC 04/20/2024 revealed significant three-vessel CAD including diffuse mid LAD 50 to 60%, 90% distal left circumflex  lesion, sequential 60 to 80% proximal/mid RCA stenosis; all lesions hemodynamically significant by virtual FFR, LVEDP 15 mmHg, she was recommended admission for CT surgery consultation for possible CABG versus multivessel PCI, decision was ultimately made to proceed with staged PCI -Staged PCI 04/22/2024 by Dr. Darron: Successful PCI with DES to RCA extending to ostium, successful angioplasty and DES to mid/distal left circumflex, stent partially jailed in OM branch had normal flow and minimal stenosis at ostium (note patient did require high doses of sedatives and narcotics)  -LDL 109 and  hemoglobin A1c 5.1% on 04/22/24, post cath serum creatinine stable today  - Medical therapy: Continue aspirin  81 mg and Effient  10 mg for at least 49-month; started metoprolol  XL 50 mg daily; started Crestor  20 mg daily ; as needed nitroglycerin  -Post cath site examined, no complication, cath site care discussed in detail at bedside -She no longer wish to see Dr. Edwyna and wants to transfer to Dr Wonda, follow up arranged with Alferd Cassis office 05/13/24 (no sooner appt available), after that she can follow with Dr Wonda if available appt   Hyperlipidemia - LDL 109, started Crestor  20 mg daily continued - Plan to repeat lipid panel and LFT in 6 weeks (LFT WNL from 03/03/2024, will add today for baseline)  Anxiety -Home medication BuSpar  and clonazepam  continued  Hypothyroidism -Home medication levothyroxine  continued  GERD -Home medication pantoprazole  continued      Did the patient have an acute coronary syndrome (MI, NSTEMI, STEMI, etc) this admission?:  No                               Did the patient have a percutaneous coronary intervention (stent / angioplasty)?:  Yes.     Cath/PCI Registry Performance & Quality Measures: Aspirin  prescribed? - Yes ADP Receptor Inhibitor (Plavix/Clopidogrel, Brilinta/Ticagrelor or Effient /Prasugrel ) prescribed (includes medically managed patients)? - Yes High Intensity  Statin (Lipitor 40-80mg  or Crestor  20-40mg ) prescribed? - Yes For EF <40%, was ACEI/ARB prescribed? - Not Applicable (EF >/= 40%) For EF <40%, Aldosterone Antagonist (Spironolactone or Eplerenone) prescribed? - Not Applicable (EF >/= 40%) Cardiac Rehab Phase II ordered? - Yes       The patient will be scheduled for a TOC follow up appointment in 14 days.  A message has been sent to the [facility] and Scheduling Pool at the office where the patient should be seen for follow up.  _____________  Discharge Vitals Blood pressure 122/85, pulse 71, temperature 98.1 F (36.7 C), temperature source Oral, resp. rate 16, height 5' (1.524 m), weight 83.9 kg, last menstrual period 12/26/2004, SpO2 97%.  Filed Weights   04/20/24 1046 04/20/24 1609  Weight: 83.9 kg 83.9 kg   Vitals:  Vitals:   04/23/24 0734 04/23/24 0929  BP: 122/85 122/85  Pulse: 70 71  Resp: 16   Temp: 98.1 F (36.7 C)   SpO2: 97%    General Appearance: In no apparent distress, sitting in bed, dressed in street clothes Cardiovascular: Regular rate and rhythm, normal S1-S2,  no murmur Respiratory: Resting breathing unlabored, lungs sounds clear to auscultation bilaterally,  no use of accessory muscles. On room air.  Extremities: Able to move all extremities in bed without difficulty, right wrist with dressing, small bruise noted, no bleeding or hematoma, pulse 2+, right hand warm to touch, no neurovascular deficit, subjective soreness + Musculoskeletal: Normal muscle bulk and tone Neurologic: Alert, oriented to person, place and time. Deaf, translator at bedside. No cognitive deficit  Psychiatric: Mild anxiety      Labs & Radiologic Studies  CBC Recent Labs    04/22/24 0331 04/23/24 0240  WBC 8.9 8.3  HGB 13.0 12.8  HCT 39.2 38.2  MCV 93.3 91.6  PLT 256 253   Basic Metabolic Panel Recent Labs    91/93/74 0856 04/23/24 0240  NA 138 135  K 3.7 3.5  CL 106 101  CO2 22 24  GLUCOSE 94 88  BUN 8 7  CREATININE  0.75 0.77  CALCIUM  9.1 9.1   Liver Function Tests No results for input(s): AST, ALT, ALKPHOS, BILITOT, PROT, ALBUMIN in the last 72 hours. No results for input(s): LIPASE, AMYLASE in the last 72 hours. High Sensitivity Troponin:   Recent Labs  Lab 04/17/24 1629 04/17/24 1742 04/20/24 1831 04/20/24 2024  TROPONINIHS 4 6 5 5     No results for input(s): TRNPT in the last 720 hours.  BNP Invalid input(s): POCBNP No results for input(s): PROBNP in the last 72 hours.  No results for input(s): BNP in the last 72 hours.  D-Dimer No results for input(s): DDIMER in the last 72 hours. Hemoglobin A1C Recent Labs    04/22/24 0331  HGBA1C 5.1   Fasting Lipid Panel Recent Labs    04/22/24 0331  CHOL 185  HDL 52  LDLCALC 109*  TRIG 122  CHOLHDL 3.6   Lipoprotein (a)  Date/Time Value Ref Range Status  04/21/2024 04:52 AM 83.2 (H) <75.0 nmol/L Final    Comment:    (NOTE) This test was developed and its performance characteristics determined by [facility]. It has not been cleared or approved by the Food and Drug Administration. Note:  Values greater than or equal to 75.0 nmol/L may       indicate an independent risk factor for CHD,       but must be evaluated with caution when applied       to non-Caucasian populations due to the       influence of genetic factors on Lp(a) across       ethnicities. Performed At: [facility] [address] [place], [place] [id number] Jennette Shorter MD Ey:[phone]     Thyroid  Function Tests No results for input(s): TSH, T4TOTAL, T3FREE, THYROIDAB in the last 72 hours.  Invalid input(s): FREET3 _____________  CARDIAC CATHETERIZATION Result Date: 04/22/2024   Mid LAD to Dist LAD lesion is 55% stenosed.   Mid RCA lesion is 65% stenosed.   1st Diag lesion is 50% stenosed.   Prox RCA lesion is 75% stenosed.   Dist RCA lesion is 40% stenosed.   Mid Cx to Dist Cx lesion is 90% stenosed.   Mid Cx lesion is  30% stenosed.   A drug-eluting stent was successfully placed using a STENT SYNERGY XD [id number].   A drug-eluting stent was successfully placed using a STENT SYNERGY XD 2.50X24.   Post intervention, there is a 0% residual stenosis.   Post intervention, there is a 0% residual stenosis.   Post intervention, there is a 0% residual stenosis. 1.  Mildly elevated left ventricular end-diastolic pressure at 18 mmHg. 2.  Successful IVUS guided PCI and drug-eluting stent placement to the right coronary artery extending to the ostium.  A 2.5 mm stent was placed and postdilated to a 3.0 proximally. 3.  Successful angioplasty and drug-eluting stent placement to the mid/distal left circumflex.  The stent partially jailed in OM branch which had normal flow and minimal stenosis at the ostium. 4.  The patient required high doses of sedatives and narcotics. Recommendations: Dual antiplatelet therapy for at least 6 months. Aggressive treatment of risk factors.   CARDIAC CATHETERIZATION Result Date: 04/20/2024 Conclusions: Significant three-vessel coronary artery disease, as detailed below, including diffuse mid LAD disease of up to 50-60%, 90% distal LCx lesion, and sequential 60-80% proximal/mid RCA stenoses.  LAD, LCx, and RCA lesions are all hemodynamically significant by virtual FFR. Upper normal left ventricular filling pressure (LVEDP 15 mmHg). Recommendations: Given ongoing chest pain over the last 3 days, admit for inpatient cardiac surgery consultation and revascularization (CABG versus multivessel PCI).  If the patient is not a good surgical candidate, I would favor PCI to LCx and RCA with medical therapy of LAD given long segment of mid LAD disease. Aggressive secondary prevention of coronary artery disease. Initiate heparin  infusion 2 hours after TR band has been removed. Lonni Hanson, MD [facility]  ECHOCARDIOGRAM STRESS TEST Result Date: 04/17/2024     EXERCISE STRESS ECHO REPORT     -------------------------------------------------------------------------------- Patient Name:   SHEYLA ZAFFINO  Date of Exam: 04/17/2024 Medical Rec #:  [id number]     Height:       60.0 in Accession #:    [id number]    Weight:       186.2 lb Date of Birth:  31-Dec-1970      BSA:          1.811 m Patient Age:    53 years      BP:           131/86 mmHg Patient Gender: F             HR:           83 bpm. Exam Location:  [address] Procedure: Stress Echo and Intracardiac Opacification Agent Indications:    R07.9 Chest pain  History:        Patient has prior history of Echocardiogram examinations, most                 recent 03/27/2024.  Sonographer:    Elsie Bohr RDCS Referring Phys: CYRUS JENNIFER SAUNDERS [facility]  Sonographer Comments: Technically difficult study due to poor echo windows. IMPRESSIONS  1. This is a negative stress echocardiogram for ischemia.  2. This is a low risk study.  3. Grossly normal wall motion with stress however image quality is reduced due to suboptimal acoustic windows for imaging. If symptoms persist, consider alternate imaging modality for stress. FINDINGS Exam Protocol: The patient exercised on a treadmill according to a Bruce protocol. Definity  contrast agent was given IV to delineate the left ventricular endocardial borders.  Patient Performance: The patient exercised for 7 minutes achieving 8 METS. The maximum stage achieved was III of the Bruce protocol. The baseline heart rate was 83 bpm. The heart rate at peak stress was 166 bpm. The target heart rate was calculated to be  140 bpm. The percentage of maximum predicted heart rate achieved was 100.8 %. The baseline blood pressure was 131/86 mmHg. The blood pressure at peak stress was 192/72 mmHg. The blood pressure response was normal. The patient developed  chest pain and nausea during the stress exam. The symptoms did not resolve. The patient's functional capacity was average.  EKG: Resting EKG showed normal sinus rhythm with no abnormal  findings. The patient developed upsloping ST depressions and beat to beat ST depressions, nonspecific for ischemia during exercise.  2D Echo Findings: The baseline ejection fraction was 65%. The peak ejection fraction at stress was 70%. Baseline regional wall motion abnormalities were not present. There were no stress-induced wall motion abnormalities. This is a negative stress echocardiogram for ischemia.  Soyla Merck MD Electronically signed on 04/17/2024 at 5:07:32 PM    Final    DG Chest Portable 1 View Result Date: 04/17/2024 CLINICAL DATA:  Chest pain EXAM: PORTABLE CHEST - 1 VIEW COMPARISON:  March 03, 2024 FINDINGS: No focal airspace consolidation, pleural effusion, or pneumothorax. No cardiomegaly. No acute fracture or destructive lesions. Multilevel thoracic osteophytosis. Mild levocurvature of the thoracolumbar spine. Metallic structure overlying the left mid lung, likely external to the patient. Cholecystectomy clips. IMPRESSION: 1. No acute cardiopulmonary abnormality. 2. Metallic rounded structure overlying the left mid lung, likely external to the patient. Correlation with physical exam findings requested. Electronically Signed   By: Rogelia Myers M.D.   On: 04/17/2024 17:01   LONG TERM MONITOR (3-14 DAYS) Result Date: 04/16/2024 Patch Wear Time:  14 days and 0 hours (2025-07-03T15:45:54-0400 to 2025-07-17T15:45:54-0400) Patient had a min HR of 53 bpm, max HR of 152 bpm, and avg HR of 75 bpm. Predominant underlying rhythm was Sinus Rhythm. 9 Supraventricular Tachycardia runs occurred, the run with the fastest interval lasting 4 beats with a max rate of 152 bpm, the longest lasting 15 beats with an avg rate of 97 bpm. Isolated SVEs were rare (<1.0%), SVE Couplets were rare (<1.0%), and SVE Triplets were rare (<1.0%). Isolated VEs were rare (<1.0%), VE Couplets were rare (<1.0%), and no VE Triplets were present. Ventricular Bigeminy and Trigeminy were present. Impression: Mildly abnormal but  largely unremarkable event monitor.  Brief atrial runs noted.   ECHOCARDIOGRAM COMPLETE Result Date: 03/27/2024    ECHOCARDIOGRAM REPORT   Patient Name:   Lovenia SHAUNNA Baptist  Date of Exam: 03/27/2024 Medical Rec #:  [id number]     Height:       60.0 in Accession #:    [id number]    Weight:       187.0 lb Date of Birth:  1971/07/13      BSA:          1.814 m Patient Age:    53 years      BP:           133/84 mmHg Patient Gender: F             HR:           61 bpm. Exam Location:  [address] Procedure: 2D Echo, 3D Echo, Cardiac Doppler, Color Doppler and Strain Analysis            (Both Spectral and Color Flow Doppler were utilized during            procedure). Indications:    R01.1 Murmur  History:        Patient has prior history of Echocardiogram examinations and                 Patient has no prior history of Echocardiogram examinations.                 Arrythmias:PVC, Signs/Symptoms:Hypotension, 2/6 systolic murmur  at apex Palpitations and Chest Pain; Risk Factors:Obesity and                 Former Smoker.  Sonographer:    Nolon Berg BA, RDCS Referring Phys: RAJAN R [facility] IMPRESSIONS  1. Left ventricular ejection fraction, by estimation, is 65 to 70%. Left ventricular ejection fraction by 3D volume is 68 %. The left ventricle has normal function. The left ventricle has no regional wall motion abnormalities. Left ventricular diastolic  parameters are consistent with Grade I diastolic dysfunction (impaired relaxation). The average left ventricular global longitudinal strain is -20.0 %. The global longitudinal strain is normal.  2. Right ventricular systolic function is normal. The right ventricular size is normal.  3. The mitral valve is normal in structure. Trivial mitral valve regurgitation. No evidence of mitral stenosis.  4. The aortic valve is normal in structure. Aortic valve regurgitation is not visualized. No aortic stenosis is present.  5. The inferior vena cava is dilated in size with  >50% respiratory variability, suggesting right atrial pressure of 8 mmHg. FINDINGS  Left Ventricle: Left ventricular ejection fraction, by estimation, is 65 to 70%. Left ventricular ejection fraction by 3D volume is 68 %. The left ventricle has normal function. The left ventricle has no regional wall motion abnormalities. The average left ventricular global longitudinal strain is -20.0 %. Strain was performed and the global longitudinal strain is normal. The left ventricular internal cavity size was normal in size. There is no left ventricular hypertrophy. Left ventricular diastolic parameters are consistent with Grade I diastolic dysfunction (impaired relaxation). Indeterminate filling pressures. Right Ventricle: The right ventricular size is normal. No increase in right ventricular wall thickness. Right ventricular systolic function is normal. Left Atrium: Left atrial size was normal in size. Right Atrium: Right atrial size was normal in size. Pericardium: There is no evidence of pericardial effusion. Mitral Valve: The mitral valve is normal in structure. Trivial mitral valve regurgitation. No evidence of mitral valve stenosis. Tricuspid Valve: The tricuspid valve is normal in structure. Tricuspid valve regurgitation is not demonstrated. No evidence of tricuspid stenosis. Aortic Valve: The aortic valve is normal in structure. Aortic valve regurgitation is not visualized. No aortic stenosis is present. Pulmonic Valve: The pulmonic valve was normal in structure. Pulmonic valve regurgitation is not visualized. No evidence of pulmonic stenosis. Aorta: The aortic root is normal in size and structure. Venous: The inferior vena cava is dilated in size with greater than 50% respiratory variability, suggesting right atrial pressure of 8 mmHg. IAS/Shunts: No atrial level shunt detected by color flow Doppler. Additional Comments: 3D was performed not requiring image post processing on an independent workstation and was  normal.  LEFT VENTRICLE PLAX 2D LVIDd:         4.40 cm         Diastology LVIDs:         2.50 cm         LV e' medial:    6.20 cm/s LV PW:         0.80 cm         LV E/e' medial:  13.3 LV IVS:        0.80 cm         LV e' lateral:   10.80 cm/s LVOT diam:     1.85 cm         LV E/e' lateral: 7.6 LV SV:         55 LV SV Index:   30  2D Longitudinal LVOT Area:     2.69 cm        Strain                                2D Strain GLS   -19.1 %                                (A4C):                                2D Strain GLS   -21.7 %                                (A3C):                                2D Strain GLS   -19.4 %                                (A2C):                                2D Strain GLS   -20.0 %                                Avg:                                 3D Volume EF                                LV 3D EF:    Left                                             ventricul                                             ar                                             ejection                                             fraction                                             by 3D  volume is                                             68 %.                                 3D Volume EF:                                3D EF:        68 %                                LV EDV:       87 ml                                LV ESV:       28 ml                                LV SV:        59 ml RIGHT VENTRICLE             IVC RV Basal diam:  1.90 cm     IVC diam: 2.10 cm RV S prime:     12.90 cm/s TAPSE (M-mode): 3.1 cm LEFT ATRIUM             Index        RIGHT ATRIUM          Index LA diam:        3.40 cm 1.87 cm/m   RA Area:     5.82 cm LA Vol (A2C):   33.6 ml 18.52 ml/m  RA Volume:   7.97 ml  4.39 ml/m LA Vol (A4C):   32.4 ml 17.86 ml/m LA Biplane Vol: 33.2 ml 18.30 ml/m  AORTIC VALVE LVOT Vmax:   82.80 cm/s LVOT Vmean:  56.100 cm/s LVOT VTI:    0.205  m  AORTA Ao Root diam: 3.10 cm Ao Asc diam:  2.90 cm MITRAL VALVE MV Area (PHT): 3.42 cm     SHUNTS MV Decel Time: 222 msec     Systemic VTI:  0.20 m MV E velocity: 82.30 cm/s   Systemic Diam: 1.85 cm MV A velocity: 100.00 cm/s MV E/A ratio:  0.82 Annabella Scarce MD Electronically signed by Annabella Scarce MD Signature Date/Time: 03/27/2024/4:18:19 PM    Final     Disposition Patient Is seen by Dr Wonda and myself today. She c/o some soreness of her cath site and right arm. She states he original chest pain and left arm pain had much improved. She has not been able to sleep well in the hospital. She is eager to go home. All medications education, post cath site care, follow up plan discussed at bedside with translator in detail. All questions answered. All meds sent to [facility] pharmacy today. She wants to switch cardiologist to Dr Wonda going forward, message sent, and follow up arranged 05/13/24. Pt is being discharged home today in good condition.  Follow-up Plans & Appointments  Follow-up Information  Revankar, Jennifer SAUNDERS, MD Follow up.   Specialty: Cardiology Why: Office will call you to arrange follow-up in 2 weeks Contact information: [address] [place]  [place] [address] [phone]                Discharge Instructions     Amb Referral to Cardiac Rehabilitation   Complete by: As directed    Diagnosis: Coronary Stents   After initial evaluation and assessments completed: Virtual Based Care may be provided alone or in conjunction with Phase 2 Cardiac Rehab based on patient barriers.: Yes   Intensive Cardiac Rehabilitation (ICR) [facility] location only OR Traditional Cardiac Rehabilitation (TCR) *If criteria for ICR are not met will enroll in TCR [facility] only): Yes   Diet - low sodium heart healthy   Complete by: As directed    Discharge instructions   Complete by: As directed    Radial Site Care  Refer to this sheet in the next few weeks. These instructions provide  you with information on caring for yourself after your procedure. Your caregiver may also give you more specific instructions. Your treatment has been planned according to current medical practices, but problems sometimes occur. Call your caregiver if you have any problems or questions after your procedure.  HOME CARE INSTRUCTIONS You may shower the day after the procedure. Remove the bandage (dressing) and gently wash the site with plain soap and water . Gently pat the site dry.  Do not apply powder or lotion to the site.  Do not submerge the affected site in water  for 3 to 5 days.  Inspect the site at least twice daily.  Do not flex or bend the affected arm for 24 hours.  No lifting over 5 pounds (2.3 kg) for 5 days after your procedure.  Do not drive home if you are discharged the same day of the procedure. Have someone else drive you.  You may drive 24 hours after the procedure unless otherwise instructed by your caregiver.   What to expect: Any bruising will usually fade within 1 to 2 weeks.  Blood that collects in the tissue (hematoma) may be painful to the touch. It should usually decrease in size and tenderness within 1 to 2 weeks.   SEEK IMMEDIATE MEDICAL CARE IF: You have unusual pain at the radial site.  You have redness, warmth, swelling, or pain at the radial site.  You have drainage (other than a small amount of blood on the dressing).  You have chills.  You have a fever or persistent symptoms for more than 72 hours.  You have a fever and your symptoms suddenly get worse.  Your arm becomes pale, cool, tingly, or numb.  You have heavy bleeding from the site. Hold pressure on the site.     PLEASE DO NOT MISS ANY DOSES OF YOUR ASPIRIN /Effient  !!!!!   Also keep a log of you blood pressures and bring back to your follow up appt. Please call the office with any questions.   Patients taking blood thinners should generally stay away from medicines like ibuprofen , Advil , Motrin ,  naproxen , and Aleve  due to risk of stomach bleeding. You may take Tylenol  as directed or talk to your primary doctor about alternatives.  PLEASE ENSURE THAT YOU DO NOT RUN OUT OF YOUR ASPIRIN /Effient . This medication is very important to remain on for at least 6months. IF you have issues obtaining this medication due to cost please CALL the office 3-5 business days prior to running  out in order to prevent missing doses of this medication.    PLEASE REMEMBER TO BRING ALL OF YOUR MEDICATIONS TO EACH OF YOUR FOLLOW-UP OFFICE VISITS.  PLEASE ATTEND ALL SCHEDULED FOLLOW-UP APPOINTMENTS.   Activity: Increase activity slowly as tolerated. You may shower, but no soaking baths (or swimming) for 1 week. No driving for 24 hours. No lifting over 5 lbs for 1 week. No sexual activity for 1 week.   You May Return to Work: in 2 weeks (if applicable)  Wound Care: You may wash cath site gently with soap and water . Keep cath site clean and dry. If you notice pain, swelling, bleeding or pus at your cath site, please call [phone].   Increase activity slowly   Complete by: As directed        Discharge Medications Allergies as of 04/23/2024       Reactions   Onion Anaphylaxis   Other Other (See Comments)   Ossmo Prep - drop in BP Steroids Went crazy   Shellfish Allergy Anaphylaxis, Swelling   *pt denies    Amitriptyline  Anxiety, Palpitations, Other (See Comments)   hallucinations   Amoxicillin -pot Clavulanate Nausea And Vomiting   With high dose, low dose ok   Amoxicillin -pot Clavulanate Nausea And Vomiting   With high dose, low dose ok   Cefuroxime Nausea And Vomiting   Cephalexin  Other (See Comments)    gi upset  gi upset   Ciprofloxacin  Other (See Comments)    Fevers   Clarithromycin Nausea And Vomiting, Other (See Comments)   Pt is OK with azithromycin   gi upset   Clarithromycin Nausea And Vomiting   Pt is OK with azithromycin    Doxycycline  Other (See Comments), Hives    gi upset   gi upset   Isosorbide  Mononitrate [isosorbide  Nitrate] Other (See Comments)   Severe migraine from this- pt refuses to take it anymore   Propranolol  Other (See Comments)   Fatigue and makes her feel crazy, refuses to take.   Sulfa Antibiotics Other (See Comments)   Rash, vaginal blisters   Sulfasalazine Itching   Rash, vaginal blisters Rash, vaginal blisters   Sulfonamide Derivatives Other (See Comments)   vaginal blisters   Bacitracin Hives   Redness, pain at site of application   Isosorbide     Migraine   Ondansetron  Other (See Comments)   CAUSES SEVERE MIGRAINES Other reaction(s): Headache  CAUSES SEVERE MIGRAINES  CAUSES SEVERE MIGRAINES   Benadryl  [diphenhydramine  Hcl (sleep)] Anxiety   Extreme agitation   Shrimp Flavor Agent (non-screening) Other (See Comments)   unknown        Medication List     STOP taking these medications    ibuprofen  200 MG tablet Commonly known as: ADVIL    isosorbide  mononitrate 30 MG 24 hr tablet Commonly known as: IMDUR        TAKE these medications    acetaminophen  500 MG tablet Commonly known as: TYLENOL  Take 1,000 mg by mouth every 6 (six) hours as needed for mild pain (pain score 1-3) or moderate pain (pain score 4-6).   aspirin  EC 81 MG tablet Take 162 mg by mouth daily. Swallow whole.   busPIRone  10 MG tablet Commonly known as: BUSPAR  TAKE 2 TABLETS BY MOUTH 3 TIMES A DAY   clonazePAM  0.5 MG tablet Commonly known as: KLONOPIN  Take 1 tablet (0.5 mg total) by mouth 2 (two) times daily as needed for anxiety.   estradiol  1 MG tablet Commonly known as: ESTRACE  TAKE 1 TABLET BY MOUTH EVERY DAY  What changed: when to take this   fluticasone  50 MCG/ACT nasal spray Commonly known as: FLONASE  SPRAY 1 SPRAY INTO EACH NOSTRIL EVERY DAY What changed: See the new instructions.   levothyroxine  25 MCG tablet Commonly known as: SYNTHROID  TAKE 1 TABLET BY MOUTH EVERY DAY What changed: when to take this   metoprolol   succinate 50 MG 24 hr tablet Commonly known as: TOPROL -XL Take 1 tablet (50 mg total) by mouth daily. Take with or immediately following a meal. What changed:  medication strength how much to take additional instructions   nitroGLYCERIN  0.4 MG SL tablet Commonly known as: NITROSTAT  Place 1 tablet (0.4 mg total) under the tongue every 5 (five) minutes as needed for chest pain.   pantoprazole  40 MG tablet Commonly known as: PROTONIX  TAKE 1 TABLET BY MOUTH EVERY DAY   prasugrel  10 MG Tabs tablet Commonly known as: EFFIENT  Take 1 tablet (10 mg total) by mouth daily.   promethazine  12.5 MG tablet Commonly known as: PHENERGAN  Take 1 tablet (12.5 mg total) by mouth every 6 (six) hours as needed for nausea or vomiting.   rosuvastatin  20 MG tablet Commonly known as: CRESTOR  Take 1 tablet (20 mg total) by mouth daily.   STOOL SOFTENER PO Take 1 tablet by mouth 2 (two) times daily as needed.   traMADol  50 MG tablet Commonly known as: ULTRAM  Take 1 tablet (50 mg total) by mouth every 6 (six) hours as needed for severe pain (pain score 7-10).   VITAMIN C PO Take 1 tablet by mouth in the morning.         Outstanding Labs/Studies N/a   Duration of Discharge Encounter: APP Time: 35 minutes   Signed, Shirl Fruits, NP 04/23/2024, 9:56 AM  Patient seen, examined. Available data reviewed. Agree with findings, assessment, and plan as outlined by Shirl Fruits, NP. The patient is independently interviewed and examined. Her husband and the sign language interpreter are both present. She expresses that she is feeling very overwhelmed with her diagnosis of CAD and is concerned about managing this in the context of her high stress job. On my exam today:  Vitals:   04/23/24 0734 04/23/24 0929  BP: 122/85 122/85  Pulse: 70 71  Resp: 16   Temp: 98.1 F (36.7 C)   SpO2: 97%    Pt is alert and oriented, NAD HEENT: normal Neck: JVP - normal Lungs: CTA bilaterally CV: RRR without murmur or  gallop Abd: soft, NT, Positive BS, no hepatomegaly Ext: no C/C/E, distal pulses intact and equal. Right radial cath site is clear.  Skin: warm/dry no rash  The patient did well with 2 vessel PCI yesterday. She walked with cardiac rehab this morning without angina. She is medically stable for hospital discharge today. MD time spent conducting today's discharge is 40 minutes. This includes review of her diagnostic and interventional angiograms, my exam of the patient, and extensive counseling on CAD/angina management, benefits of cardiac rehab, managing job stress and return to work timing, and review of her medical program with rationale for each of her cardiac medications. I will see her in outpatient follow-up.     Ozell Fell, M.D. 04/23/2024 10:30 AM

## 2024-04-23 NOTE — Progress Notes (Signed)
 CARDIAC REHAB PHASE I   PRE:  Rate/Rhythm: 82 NSR  BP:  Sitting: 122/85      SpO2: 97 RA  MODE:  Ambulation: 340 ft    POST:  Rate/Rhythm: 94 NSR  BP:  Sitting: 125/79      SpO2: 97 RA  Pt ambulated with supervision assistance in the hallway. Pt walked well, she c/o of SOB and post-procedural pain in shoulder and chest. Pt returned to room and educated with sign language interpreter present.   Pt was educated on stent card, stent location, Antiplatelet and ASA use, wt restrictions, no baths/daily wash-ups, s/s of infection, ex guidelines, s/s to stop exercising, NTG use and calling 911, heart healthy diet, risk factors and CRPII. Pt received materials on exercise, diet, and CRPII. Will refer to [facility].   Pt is interested in attending CR   Donna Davis Candy  MS, ACSM-CEP 9:13 AM 04/23/2024    Service time is from 0810 to 0900.

## 2024-04-23 NOTE — Progress Notes (Signed)
 Went over AVS paperwork, answered any/all questions, TOC meds given, patient belongings gathered, Ivs removed and patient was wheeled out to husbands vehicle.

## 2024-04-23 NOTE — TOC Transition Note (Signed)
 Transition of Care [facility]) - Discharge Note   Patient Details  Name: Donna Davis MRN: [id number] Date of Birth: [birth year]  Transition of Care [facility]) CM/SW Contact:  Roxie KANDICE Stain, RN Phone Number: 04/23/2024, 11:38 AM   Clinical Narrative:    Patient stable for discharge.  Follow up apts on AVS. No other TOC needs.   Final next level of care: Home/Self Care Barriers to Discharge: Barriers Resolved   Patient Goals and CMS Choice      Return home      Discharge Placement                 home      Discharge Plan and Services Additional resources added to the After Visit Summary for                                       Social Drivers of Health (SDOH) Interventions SDOH Screenings   Food Insecurity: No Food Insecurity (04/20/2024)  Housing: Low Risk  (04/20/2024)  Transportation Needs: No Transportation Needs (04/20/2024)  Utilities: Not At Risk (04/20/2024)  Depression (PHQ2-9): Low Risk  (01/09/2024)  Tobacco Use: Medium Risk (04/20/2024)     Readmission Risk Interventions    04/23/2024   11:00 AM  Readmission Risk Prevention Plan  Post Dischage Appt Complete  Medication Screening Complete  Transportation Screening Complete

## 2024-04-24 ENCOUNTER — Telehealth: Payer: Self-pay

## 2024-04-24 NOTE — Transitions of Care (Post Inpatient/ED Visit) (Signed)
 04/24/2024  Name: Donna Davis MRN: [id number] DOB: 19-Feb-1971  Today's TOC FU Call Status: Today's TOC FU Call Status:: Successful TOC FU Call Completed Unsuccessful Call (1st Attempt) Date: 04/24/24 [facility] FU Call Complete Date: 04/24/24 Patient's Name and Date of Birth confirmed.  Transition Care Management Follow-up Telephone Call Date of Discharge: 04/23/24 Discharge Facility: [facility]) Type of Discharge: Inpatient Admission Primary Inpatient Discharge Diagnosis:: angina How have you been since you were released from the hospital?: Better Any questions or concerns?: No  Items Reviewed: Did you receive and understand the discharge instructions provided?: Yes Medications obtained,verified, and reconciled?: Yes (Medications Reviewed) Any new allergies since your discharge?: No Dietary orders reviewed?: Yes Do you have support at home?: Yes People in Home [RPT]: spouse  Medications Reviewed Today: Medications Reviewed Today     Reviewed by Emmitt Pan, LPN (Licensed Practical Nurse) on 04/24/24 at [phone]  Med List Status: <None>   Medication Order Taking? Sig Documenting Provider Last Dose Status Informant  acetaminophen  (TYLENOL ) 500 MG tablet [id number] Yes Take 1,000 mg by mouth every 6 (six) hours as needed for mild pain (pain score 1-3) or moderate pain (pain score 4-6). [provider]  Active Self, Pharmacy Records  Ascorbic Acid (VITAMIN C PO) [id number] Yes Take 1 tablet by mouth in the morning. [provider]  Active Self, Pharmacy Records           Med Note (GARNER, TIFFANY L   Fri Apr 10, 2024  3:08 PM)    aspirin  EC 81 MG tablet [id number] Yes Take 81 mg by mouth daily. Swallow whole. [provider]  Active Self, Pharmacy Records  busPIRone  (BUSPAR ) 10 MG tablet [id number] Yes TAKE 2 TABLETS BY MOUTH 3 TIMES A DAY Lau, Grace, MD  Active Self, Pharmacy Records           Med Note EFRAIM ALFREIDA LITTIE Pablo Apr 20, 2024  9:20 PM) LF: 02/24/24 for a 90ds  clonazePAM  (KLONOPIN ) 0.5 MG tablet [id number] Yes Take 1 tablet (0.5 mg total) by mouth 2 (two) times daily as needed for anxiety. Corey, Evan S, MD  Active Self, Pharmacy Records           Med Note EFRAIM ALFREIDA LITTIE Pablo Apr 20, 2024  9:20 PM) LF: 04/06/24 for a 30ds  Docusate Calcium  (STOOL SOFTENER PO) [id number] Yes Take 1 tablet by mouth 2 (two) times daily as needed. [provider]  Active Self, Pharmacy Records  estradiol  (ESTRACE ) 1 MG tablet [id number] Yes TAKE 1 TABLET BY MOUTH EVERY DAY  Patient taking differently: Take 1 mg by mouth in the morning.   [name], MD  Active Self, Pharmacy Records           Med Note EFRAIM ALFREIDA LITTIE Pablo Apr 20, 2024  9:21 PM) LF: 02/23/24 for a 90ds  fluticasone  (FLONASE ) 50 MCG/ACT nasal spray [id number] Yes SPRAY 1 SPRAY INTO EACH NOSTRIL EVERY DAY  Patient taking differently: Place 1 spray into both nostrils in the morning.   [name], MD  Active Self, Pharmacy Records           Med Note EFRAIM ALFREIDA LITTIE Pablo Apr 20, 2024  9:19 PM) LF: 04/16/24 for a 30ds  levothyroxine  (SYNTHROID ) 25 MCG tablet [id number] Yes TAKE 1 TABLET BY MOUTH EVERY DAY  Patient taking differently: Take 25 mcg by mouth in the morning.   [name], MD  Active Self, Pharmacy Records           Med Note EFRAIM ALFREIDA LITTIE Pablo Apr 20, 2024  9:21 PM) LF: 02/23/24 for a 90ds  metoprolol  succinate (TOPROL -XL) 50 MG 24 hr tablet [id number] Yes Take 1 tablet (50 mg total) by mouth daily. Take with or immediately following a meal. Zhao, Xika, NP  Active   nitroGLYCERIN  (NITROSTAT ) 0.4 MG SL tablet [id number] Yes Place 1 tablet (0.4 mg total) under the tongue every 5 (five) minutes as needed for chest pain. Zhao, Xika, NP  Active   pantoprazole  (PROTONIX ) 40 MG tablet [id number] Yes TAKE 1 TABLET BY MOUTH EVERY DAY [name], MD  Active Self, Pharmacy Records           Med Note EFRAIM ALFREIDA LITTIE Pablo Apr 20, 2024  9:20 PM) LF: 02/24/24 for a 90ds  prasugrel  (EFFIENT ) 10 MG TABS tablet [id number] Yes Take 1 tablet (10 mg total) by mouth daily. Zhao, Xika, NP  Active   promethazine  (PHENERGAN ) 12.5 MG tablet [id number] Yes Take 1 tablet (12.5 mg total) by mouth every 6 (six) hours as needed for nausea or vomiting. [name], MD  Active   rosuvastatin  (CRESTOR ) 20 MG tablet [id number] Yes Take 1 tablet (20 mg total) by mouth daily. Zhao, Xika, NP  Active   traMADol  (ULTRAM ) 50 MG tablet [id number] Yes Take 1 tablet (50 mg total) by mouth every 6 (six) hours as needed for severe pain (pain score 7-10). [name], MD  Active             Home Care and Equipment/Supplies: Were Home Health Services Ordered?: NA Any new equipment or medical supplies ordered?: NA  Functional Questionnaire: Do you need assistance with bathing/showering or dressing?: No Do you need assistance with meal preparation?: No Do you need assistance with eating?: No Do you have difficulty maintaining continence: No Do you need assistance with getting out of bed/getting out of a chair/moving?: No Do you have difficulty managing or taking your medications?: No  Follow up appointments reviewed: PCP Follow-up appointment confirmed?: No (sent message to staff to schedule) Specialist Hospital Follow-up appointment confirmed?: No Reason Specialist Follow-Up Not Confirmed: Patient has Specialist Provider Number and will Call for Appointment Do you need transportation to your follow-up appointment?: No Do you understand care options if your condition(s) worsen?: Yes-patient verbalized understanding    SIGNATURE Julian Lemmings, LPN [facility] Nurse Health Advisor Direct Dial [phone]

## 2024-04-24 NOTE — Transitions of Care (Post Inpatient/ED Visit) (Signed)
   04/24/2024  Name: Donna Davis MRN: [id number] DOB: [birth year]  Today's TOC FU Call Status: Today's TOC FU Call Status:: Unsuccessful Call (1st Attempt) Unsuccessful Call (1st Attempt) Date: 04/24/24  Attempted to reach the patient regarding the most recent Inpatient/ED visit.  Follow Up Plan: Additional outreach attempts will be made to reach the patient to complete the Transitions of Care (Post Inpatient/ED visit) call.   Signature Julian Lemmings, LPN [facility] Nurse Health Advisor Direct Dial [phone]

## 2024-04-27 ENCOUNTER — Encounter (HOSPITAL_COMMUNITY): Payer: Self-pay

## 2024-04-28 ENCOUNTER — Telehealth (HOSPITAL_COMMUNITY): Payer: Self-pay

## 2024-04-28 NOTE — Telephone Encounter (Signed)
 Patient called using phone interpreter to return call regarding cardiac rehab. Went over program, patient confirmed interest. Informed her we will need her to attend her 8/27 f/u appt and proceed once we have those visit notes. Patient was upset at having to wait, let patient know she can check her doctor's office for last minute cancellations to move her appt up, patient refused. Patient also asked about an emotional support group her doctor had recommended, informed patient we did not have information about that and to reach out to her doctor about it.  Patient is aware we will be in touch after 8/27.

## 2024-04-30 ENCOUNTER — Other Ambulatory Visit: Payer: Self-pay

## 2024-04-30 ENCOUNTER — Ambulatory Visit: Payer: Self-pay

## 2024-04-30 ENCOUNTER — Telehealth: Payer: Self-pay | Admitting: Cardiology

## 2024-04-30 VITALS — BP 115/54 | HR 63 | Temp 98.1°F | Ht 60.0 in | Wt 185.4 lb

## 2024-04-30 DIAGNOSIS — I25110 Atherosclerotic heart disease of native coronary artery with unstable angina pectoris: Secondary | ICD-10-CM | POA: Diagnosis not present

## 2024-04-30 DIAGNOSIS — I200 Unstable angina: Secondary | ICD-10-CM

## 2024-04-30 NOTE — Telephone Encounter (Signed)
 Dr Benuel saw her today at a visit. She said patient was having significant right arm pain from cath procedure she had on 04/19/24. She said patient has to use sign language to communicate so that is probably not helping her with the arm pain.She said she may need to be seen sooner. Please advise

## 2024-04-30 NOTE — Assessment & Plan Note (Addendum)
 Hospital history:  Patient returns for hospital follow up post significant three-vessel coronary artery disease, including diffuse mid LAD disease of up to 50-60%, 90% distal LCx lesion, and sequential 60-80% proximal/mid RCA stenoses status 2 DES stent placement in RCA and mid/distal left circumflex. Currently taking aspirin  81 mg and Effient  10 mg for at least 28-month; metoprolol  XL 50 mg daily; Crestor  20 mg daily in addition to other home medications.   Clinic complaints: -Patient reports No chest pain, SOB, or LE edema.  -She reports complaints of right arm swelling sensation (2+ palpable pulse and without swelling present on physical exam), difficulty using right hand, and sensation change of right forearm and hand post stent. Patient reports she gave her arm the recommended rest for 5 days, however she uses sign language to communicate, so after the 5 days she has had to adapt as much as she can handle. Unlikely occlusion with 2+ pulse, no pallor, no swelling. Pain not out of proportion to physical exam. Likely nerve irritation from 3 catheterizations -Endorses nausea since leaving hospital using phenergan  as needed, tolerable at the moment; likely medication related (denies Constipation, diarrhea, vomiting).  -Other complaints include dry mouth, increased thirst, and some episodes of LH and dizziness (although much improved from before catheterization). No current LH or dizziness. Likely medication or cardiac related. Patient wore Zio patch until today (returned to cardiology). Drinking about 3 large bottles of waters a day. 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 She works from home twice a week and at the office three times a week. Patient is requesting a note to clear her to work from home until she can be cleared by cardiology to drive again.   Plan:  -Current Cardiology appointment scheduled for 05/13/2024. I called Dr. Margurite  cardiology clinic requesting Mrs. Hope to be seen sooner regarding arm pain and questions about medication side effects. Clinic stated they would call patient.  -referral to [facility] pharmacy deferred until after meeting with Dr. Wonda as patient has difficult time getting to appointments without clearance to drive (relies on family) -work clearance note to work from home completed and signed to be faxed to patient's place of work.

## 2024-04-30 NOTE — Progress Notes (Addendum)
 Follow up Hospitalization  Patient was admitted to [facility] on 04/20/2024 and discharged on 04/23/2024. She was treated for unstable angina and CAD. Treatment for this included 2 stents 04/22/2024. Dr. Wonda is new cardiologist. [facility] phone call on 04/24/2024 She reports excellent compliance with treatment. She reports this condition is improved.  -----------------------------------------------------------------------------------------  CC: hospital follow up  HPI:  [name] is a 53 y.o. female with pertinent past medical history of unstable angina and CAD (further medical history stated below) and presents today for hospital follow up. Please see problem based assessment and plan for additional details.  Last Pertinent Labs Documented:     Latest Ref Rng & Units 04/23/2024    2:40 AM 04/22/2024    8:56 AM 04/17/2024    4:29 PM  BMP  Glucose 70 - 99 mg/dL 88  94  87   BUN 6 - 20 mg/dL 7  8  10    Creatinine 0.44 - 1.00 mg/dL 9.22  9.24  9.31   Sodium 135 - 145 mmol/L 135  138  139   Potassium 3.5 - 5.1 mmol/L 3.5  3.7  3.9   Chloride 98 - 111 mmol/L 101  106  105   CO2 22 - 32 mmol/L 24  22  24    Calcium  8.9 - 10.3 mg/dL 9.1  9.1  9.4        Latest Ref Rng & Units 04/23/2024    2:40 AM 04/22/2024    3:31 AM 04/21/2024    4:52 AM  CBC  WBC 4.0 - 10.5 K/uL 8.3  8.9  7.6   Hemoglobin 12.0 - 15.0 g/dL 87.1  86.9  87.2   Hematocrit 36.0 - 46.0 % 38.2  39.2  38.4   Platelets 150 - 400 K/uL 253  256  225     Lab Results  Component Value Date   HGBA1C 5.1 04/22/2024   HGBA1C 5.1 11/07/2009     Past Medical History:  Diagnosis Date   Allergic rhinitis due to allergen 12/19/2016   Anxiety    Anxiety state 09/03/2006   Arthralgia of right temporomandibular joint 08/26/2018   Bilateral deafness 01/17/2016   Cardiac murmur 03/19/2024   Chest pain of uncertain etiology 03/19/2024   Clostridium difficile infection    x several times   Deaf    uses sign language   Elevated LDL  cholesterol level 04/07/2024   Family history of breast cancer in mother 08/26/2015   Family history of malignant neoplasm of gastrointestinal tract    Frequent PVCs 03/19/2024   Gallstones    GERD 07/01/2006   Qualifier: Diagnosis of   By: Charmayne MD, Arlyss         Hearing difficulty    b/l, 2/2 congenital rubella s/p stapedectomy. Using hearing aid   Hepatic cyst    6 mm on CT done done in 05/2005   Hyperglycemia 04/07/2024   Hypothyroidism    IBS 07/01/2006   Qualifier: Diagnosis of   By: Charmayne MD, Arlyss         Low back pain 10/09/2022   Migraine 12/02/2012   Obesity    Obesity (BMI 35.0-39.9 without comorbidity) 02/18/2014   On hormone replacement therapy 04/07/2024   Pain in thoracic spine 10/09/2022   Palpitations 03/05/2024   PONV (postoperative nausea and vomiting)    sometimes; depends on how long I'm under   Routine adult health maintenance 09/01/2012   MMG - Normal 2014 - she is due and I  reminded her to schedule.   Pelvic - hysterectomy, no cervix.  Sees OBGYN  Flu vaccine seasonally.      DEXA - at 53yo      Screening for colon cancer 10/22/2023   Screening mammogram for breast cancer 04/07/2024    Current Outpatient Medications on File Prior to Visit  Medication Sig Dispense Refill   acetaminophen  (TYLENOL ) 500 MG tablet Take 1,000 mg by mouth every 6 (six) hours as needed for mild pain (pain score 1-3) or moderate pain (pain score 4-6).     Ascorbic Acid (VITAMIN C PO) Take 1 tablet by mouth in the morning.     aspirin  EC 81 MG tablet Take 81 mg by mouth daily. Swallow whole.     busPIRone  (BUSPAR ) 10 MG tablet TAKE 2 TABLETS BY MOUTH 3 TIMES A DAY 540 tablet 2   clonazePAM  (KLONOPIN ) 0.5 MG tablet Take 1 tablet (0.5 mg total) by mouth 2 (two) times daily as needed for anxiety. 60 tablet 4   Docusate Calcium  (STOOL SOFTENER PO) Take 1 tablet by mouth 2 (two) times daily as needed.     estradiol  (ESTRACE ) 1 MG tablet TAKE 1 TABLET BY MOUTH EVERY DAY (Patient  taking differently: Take 1 mg by mouth in the morning.) 90 tablet 3   fluticasone  (FLONASE ) 50 MCG/ACT nasal spray SPRAY 1 SPRAY INTO EACH NOSTRIL EVERY DAY (Patient taking differently: Place 1 spray into both nostrils in the morning.) 16 mL 2   levothyroxine  (SYNTHROID ) 25 MCG tablet TAKE 1 TABLET BY MOUTH EVERY DAY (Patient taking differently: Take 25 mcg by mouth in the morning.) 90 tablet 3   metoprolol  succinate (TOPROL -XL) 50 MG 24 hr tablet Take 1 tablet (50 mg total) by mouth daily. Take with or immediately following a meal. 30 tablet 3   nitroGLYCERIN  (NITROSTAT ) 0.4 MG SL tablet Place 1 tablet (0.4 mg total) under the tongue every 5 (five) minutes as needed for chest pain. 25 tablet 2   pantoprazole  (PROTONIX ) 40 MG tablet TAKE 1 TABLET BY MOUTH EVERY DAY 90 tablet 3   prasugrel  (EFFIENT ) 10 MG TABS tablet Take 1 tablet (10 mg total) by mouth daily. 30 tablet 3   promethazine  (PHENERGAN ) 12.5 MG tablet Take 1 tablet (12.5 mg total) by mouth every 6 (six) hours as needed for nausea or vomiting. 30 tablet 0   rosuvastatin  (CRESTOR ) 20 MG tablet Take 1 tablet (20 mg total) by mouth daily. 30 tablet 3   traMADol  (ULTRAM ) 50 MG tablet Take 1 tablet (50 mg total) by mouth every 6 (six) hours as needed for severe pain (pain score 7-10). 40 tablet 0   No current facility-administered medications on file prior to visit.    Family History  Problem Relation Age of Onset   Breast cancer Mother    Ovarian cancer Mother    Stroke Father    Neuropathy Father    Diabetes Father    Heart disease Father        heart attack   Irritable bowel syndrome Father    Kidney disease Father    Stomach cancer Maternal Grandmother    Colon cancer Maternal Grandmother    Diabetes Maternal Grandmother    Bipolar disorder Daughter    Heart disease Other        both grandmothers   Esophageal cancer Neg Hx    Liver disease Neg Hx     Social History   Socioeconomic History   Marital status: Married  Spouse name: Not on file   Number of children: 2   Years of education: Not on file   Highest education level: Not on file  Occupational History   Occupation: Immunologist    Comment: works in [place]   Tobacco Use   Smoking status: Former    Current packs/day: 0.00    Average packs/day: 1 pack/day for 26.0 years (26.0 ttl pk-yrs)    Types: Cigarettes    Start date: 02/17/1981    Quit date: 02/18/2007    Years since quitting: 17.2   Smokeless tobacco: Never   Tobacco comments:    quit 2008  Vaping Use   Vaping status: Never Used  Substance and Sexual Activity   Alcohol use: Yes    Comment: Very Rarely   Drug use: No   Sexual activity: Not Currently    Partners: Male    Birth control/protection: Surgical  Other Topics Concern   Not on file  Social History Narrative   Current smoker, not using alcohol or drugs at this timeLives with husband and 2 children      Are you right handed or left handed? Right Handed    Are you currently employed ? 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 No    Who lives with you? Husband and 85 pound dog    What type of home do you live in: 1 story or 2 story? Lives in a one story home        Social Drivers of Health   Financial Resource Strain: Not on file  Food Insecurity: No Food Insecurity (04/20/2024)   Hunger Vital Sign    Worried About Running Out of Food in the Last Year: Never true    Ran Out of Food in the Last Year: Never true  Transportation Needs: No Transportation Needs (04/20/2024)   PRAPARE - Administrator, Civil Service (Medical): No    Lack of Transportation (Non-Medical): No  Physical Activity: Not on file  Stress: Not on file  Social Connections: Not on file  Intimate Partner Violence: Not At Risk (04/20/2024)   Humiliation, Afraid, Rape, and Kick questionnaire    Fear of Current or Ex-Partner: No    Emotionally Abused: No    Physically Abused: No     Sexually Abused: No    Review of Systems: Review of Systems  Constitutional:  Positive for chills (with new blood thinners). Negative for fever.  Respiratory:  Negative for cough (resolved since being in hospital. when heart would flutter would cough) and shortness of breath (accompanies the pressure).   Cardiovascular:  Positive for chest pain (some, but nothing like prior to stent. pressure on sunday. very light, mild pressure today. when it was bad it radiated around back and down arm). Negative for palpitations (only twice since being home) and leg swelling.  Gastrointestinal:  Positive for nausea. Negative for abdominal pain, constipation, diarrhea and vomiting.  Genitourinary:  Negative for dysuria.  Neurological:  Positive for sensory change (right hand) and headaches (since leaving hospital, headache felt different on sunday didnt feel like normal migraine.  normal migraines have been okay.). Negative for dizziness (lightly).  Endo/Heme/Allergies:  Bruises/bleeds easily.     Vitals:   04/30/24 1436  BP: (!) 115/54  Pulse: 63  Temp: 98.1 F (36.7 C)  TempSrc: Oral  SpO2: 97%  Weight: 185 lb 6.4 oz (84.1 kg)  Height: 5' (1.524 m)  Physical Exam: Physical Exam HENT:     Mouth/Throat:     Mouth: Mucous membranes are moist.     Pharynx: Oropharynx is clear.  Cardiovascular:     Rate and Rhythm: Normal rate and regular rhythm.     Heart sounds: Normal heart sounds. No murmur heard.    No friction rub. No gallop.  Pulmonary:     Effort: Pulmonary effort is normal.     Breath sounds: No stridor. No wheezing, rhonchi or rales.  Abdominal:     General: Bowel sounds are normal. There is no distension.     Palpations: Abdomen is soft.  Musculoskeletal:        General: No swelling.     Right lower leg: No edema.     Left lower leg: No edema.  Skin:    Findings: Bruising (right arm) present.  Neurological:     Mental Status: She is alert.      Assessment & Plan:    Patient seen with Dr. CHARLENA [facility]  Office Fax number: [phone]   Patient uses sign language interpretor to communicate. Assessment & Plan Unstable angina (HCC) Coronary artery disease involving native heart with unstable angina pectoris, unspecified vessel or lesion type [facility]) Hospital history:  Patient returns for hospital follow up post significant three-vessel coronary artery disease, including diffuse mid LAD disease of up to 50-60%, 90% distal LCx lesion, and sequential 60-80% proximal/mid RCA stenoses status 2 DES stent placement in RCA and mid/distal left circumflex. Currently taking aspirin  81 mg and Effient  10 mg for at least 57-month; metoprolol  XL 50 mg daily; Crestor  20 mg daily in addition to other home medications.   Clinic complaints: -Patient reports No chest pain, SOB, or LE edema.  -She reports complaints of right arm swelling sensation (2+ palpable pulse and without swelling present on physical exam), difficulty using right hand, and sensation change of right forearm and hand post stent. Patient reports she gave her arm the recommended rest for 5 days, however she uses sign language to communicate, so after the 5 days she has had to adapt as much as she can handle. Unlikely occlusion with 2+ pulse, no pallor, no swelling. Pain not out of proportion to physical exam. Likely nerve irritation from 3 catheterizations -Endorses nausea since leaving hospital using phenergan  as needed, tolerable at the moment; likely medication related (denies Constipation, diarrhea, vomiting).  -Other complaints include dry mouth, increased thirst, and some episodes of LH and dizziness (although much improved from before catheterization). No current LH or dizziness. Likely medication or cardiac related. Patient wore Zio patch until today (returned to cardiology). Drinking about 3 large bottles of waters a day. 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 She works from home twice a week and at the office three times a week. Patient is requesting a note to clear her to work from home until she can be cleared by cardiology to drive again.   Plan:  -Current Cardiology appointment scheduled for 05/13/2024. I called Dr. Margurite cardiology clinic requesting Mrs. Hope to be seen sooner regarding arm pain and questions about medication side effects. Clinic stated they would call patient.  -referral to [facility] pharmacy deferred until after meeting with Dr. Wonda as patient has difficult time getting to appointments without clearance to drive (relies on family) -work clearance note to work from home completed and signed to be faxed to patient's place of work.  No orders of the defined types were placed in this encounter.    Sallyanne Primas, D.O. [facility], PGY-1 Date 04/30/2024 Time 6:55 PM

## 2024-04-30 NOTE — Patient Instructions (Addendum)
 Today we discussed the following medical conditions and plan:   I'm going to call cardiology to see if you can see them sooner to discuss medications and your arm pain to PA and Dr. Wonda. If you still have questions I can make a referral to pharmacist.   I will write a letter saying you are cleared for work to work from home. Cardiology will have to send the okay for you to drive.   DASH diet attached in back.   We look forward to seeing you next time. Please call our clinic at [phone] if you have any questions or concerns. The best time to call is Monday-Friday from 9am-4pm, but there is someone available 24/7. If you need medication refills, please notify your pharmacy one week in advance and they will send us  a request.   Thank you for trusting me with your care. Wishing you the best!   Sallyanne Primas, DO  [facility]

## 2024-05-01 ENCOUNTER — Emergency Department (HOSPITAL_BASED_OUTPATIENT_CLINIC_OR_DEPARTMENT_OTHER)
Admission: EM | Admit: 2024-05-01 | Discharge: 2024-05-02 | Disposition: A | Discharge status: Home / Self Care | Source: Ambulatory Visit | Attending: Emergency Medicine | Admitting: Emergency Medicine

## 2024-05-01 ENCOUNTER — Emergency Department (HOSPITAL_BASED_OUTPATIENT_CLINIC_OR_DEPARTMENT_OTHER)

## 2024-05-01 ENCOUNTER — Other Ambulatory Visit: Payer: Self-pay

## 2024-05-01 ENCOUNTER — Encounter (HOSPITAL_BASED_OUTPATIENT_CLINIC_OR_DEPARTMENT_OTHER): Payer: Self-pay

## 2024-05-01 DIAGNOSIS — M25531 Pain in right wrist: Secondary | ICD-10-CM | POA: Insufficient documentation

## 2024-05-01 DIAGNOSIS — M79631 Pain in right forearm: Secondary | ICD-10-CM | POA: Diagnosis present

## 2024-05-01 DIAGNOSIS — R0602 Shortness of breath: Secondary | ICD-10-CM | POA: Insufficient documentation

## 2024-05-01 DIAGNOSIS — M79601 Pain in right arm: Secondary | ICD-10-CM

## 2024-05-01 DIAGNOSIS — Z7982 Long term (current) use of aspirin: Secondary | ICD-10-CM | POA: Insufficient documentation

## 2024-05-01 DIAGNOSIS — Z79899 Other long term (current) drug therapy: Secondary | ICD-10-CM | POA: Insufficient documentation

## 2024-05-01 DIAGNOSIS — M79641 Pain in right hand: Secondary | ICD-10-CM | POA: Insufficient documentation

## 2024-05-01 DIAGNOSIS — I2510 Atherosclerotic heart disease of native coronary artery without angina pectoris: Secondary | ICD-10-CM | POA: Insufficient documentation

## 2024-05-01 DIAGNOSIS — I97638 Postprocedural hematoma of a circulatory system organ or structure following other circulatory system procedure: Secondary | ICD-10-CM | POA: Diagnosis not present

## 2024-05-01 LAB — COMPREHENSIVE METABOLIC PANEL WITH GFR
ALT: 12 U/L (ref 0–44)
AST: 18 U/L (ref 15–41)
Albumin: 4.3 g/dL (ref 3.5–5.0)
Alkaline Phosphatase: 57 U/L (ref 38–126)
Anion gap: 13 (ref 5–15)
BUN: 7 mg/dL (ref 6–20)
CO2: 25 mmol/L (ref 22–32)
Calcium: 9.7 mg/dL (ref 8.9–10.3)
Chloride: 103 mmol/L (ref 98–111)
Creatinine, Ser: 0.7 mg/dL (ref 0.44–1.00)
GFR, Estimated: >60 mL/min (ref >60)
Glucose, Bld: 79 mg/dL (ref 70–99)
Potassium: 3.8 mmol/L (ref 3.5–5.1)
Sodium: 141 mmol/L (ref 135–145)
Total Bilirubin: 0.4 mg/dL (ref 0.0–1.2)
Total Protein: 7.1 g/dL (ref 6.5–8.1)

## 2024-05-01 LAB — CBC WITH DIFFERENTIAL/PLATELET
Abs Immature Granulocytes: 0.01 K/uL (ref 0.00–0.07)
Basophils Absolute: 0 K/uL (ref 0.0–0.1)
Basophils Relative: 1 %
Eosinophils Absolute: 0.3 K/uL (ref 0.0–0.5)
Eosinophils Relative: 4 %
HCT: 37.8 % (ref 36.0–46.0)
Hemoglobin: 12.8 g/dL (ref 12.0–15.0)
Immature Granulocytes: 0 %
Lymphocytes Relative: 52 %
Lymphs Abs: 4.4 K/uL — ABNORMAL HIGH (ref 0.7–4.0)
MCH: 30.9 pg (ref 26.0–34.0)
MCHC: 33.9 g/dL (ref 30.0–36.0)
MCV: 91.3 fL (ref 80.0–100.0)
Monocytes Absolute: 0.5 K/uL (ref 0.1–1.0)
Monocytes Relative: 6 %
Neutro Abs: 3.1 K/uL (ref 1.7–7.7)
Neutrophils Relative %: 37 %
Platelets: 248 K/uL (ref 150–400)
RBC: 4.14 MIL/uL (ref 3.87–5.11)
RDW: 11.5 % (ref 11.5–15.5)
WBC: 8.2 K/uL (ref 4.0–10.5)
nRBC: 0 % (ref 0.0–0.2)

## 2024-05-01 MED ORDER — IOHEXOL 350 MG/ML SOLN
75.0000 mL | Freq: Once | INTRAVENOUS | Status: AC | PRN
  Administered 2024-05-01: 75 mL via INTRAVENOUS

## 2024-05-01 MED ORDER — FENTANYL CITRATE PF 50 MCG/ML IJ SOSY
50.0000 ug | PREFILLED_SYRINGE | Freq: Once | INTRAMUSCULAR | Status: AC
  Administered 2024-05-01: 50 ug via INTRAVENOUS
  Filled 2024-05-01: qty 1

## 2024-05-01 NOTE — ED Triage Notes (Signed)
 Pt had x2 catheterization x1 week ago for blockages. Pt reports R arm pain since. Pt has good distal PMS in RUE.

## 2024-05-01 NOTE — ED Notes (Signed)
 ED Provider at bedside.

## 2024-05-01 NOTE — Addendum Note (Signed)
 Addended by: BENUEL BRAUN C on: 05/01/2024 12:02 PM   Modules accepted: Level of Service

## 2024-05-01 NOTE — ED Notes (Signed)
 Ultrasound at bedside

## 2024-05-01 NOTE — Telephone Encounter (Signed)
 Called the patient and she stated that she had a cardiac cath on 04/19/24. Today she had an appointment with Dr. Benuel and he advised her to call our office because she was having significant right arm pain. Patient stated that her right wrist area was very bruised and there was a throbbing pain from her right elbow down to her finger tips. There is also a small lump at the insertion site on her right wrist and it is very painful. She states that even when her clothes brush against it, it is painful. Spoke to Dr. Krasowski regarding the patient's symptoms and he recommended that she go to the ER to have her right arm evaluated. I relayed Dr. Karry recommendation to the patient and she verbalized understanding. Patient had no further questions at this time.

## 2024-05-02 NOTE — Discharge Instructions (Addendum)
 You were seen in the emergency department today for evaluation of your symptoms.  Your imaging is unremarkable.  I am recommending that you follow-up with a hand specialist for this.  Please make sure you call to schedule an appointment.  Additionally, you will need to see the cardiologist again in person.  Please make sure you are elevating the arm and taking the pain medication as needed. If you have any concerns, new or worsening symptoms, please return to the ER for re-evaluation.   Contact a health care provider if: You have a fever or chills. You have any of these signs of infection at your incision site: Redness, swelling, or pain. Fluid or blood. Warmth. Pus or a bad smell. Get help right away if: The incision area swells very fast. The incision area is bleeding, and the bleeding does not stop when you hold steady pressure on the area. Your arm or hand becomes pale, cool, tingly, or numb. These symptoms may represent a serious problem that is an emergency. Do not wait to see if the symptoms will go away. Get medical help right away. Call your local emergency services (911 in the [place] Do not drive yourself to the hospital.

## 2024-05-02 NOTE — ED Provider Notes (Signed)
 [place] [facility] AT [facility] Provider Note   CSN: [id number] Arrival date & time: 05/01/24  1740     Patient presents with: Arm Pain   Donna Davis is a 53 y.o. female.  {Add pertinent medical, surgical, social history, OB history to HPI:32947}  Arm Pain       Prior to Admission medications   Medication Sig Start Date End Date Taking? Authorizing Provider  acetaminophen  (TYLENOL ) 500 MG tablet Take 1,000 mg by mouth every 6 (six) hours as needed for mild pain (pain score 1-3) or moderate pain (pain score 4-6).    [provider]  Ascorbic Acid (VITAMIN C PO) Take 1 tablet by mouth in the morning.    [provider]  aspirin  EC 81 MG tablet Take 81 mg by mouth daily. Swallow whole.    [provider]  busPIRone  (BUSPAR ) 10 MG tablet TAKE 2 TABLETS BY MOUTH 3 TIMES A DAY 02/24/24   [name], MD  clonazePAM  (KLONOPIN ) 0.5 MG tablet Take 1 tablet (0.5 mg total) by mouth 2 (two) times daily as needed for anxiety. 03/04/24   Corey, Evan S, MD  Docusate Calcium  (STOOL SOFTENER PO) Take 1 tablet by mouth 2 (two) times daily as needed.    [provider]  estradiol  (ESTRACE ) 1 MG tablet TAKE 1 TABLET BY MOUTH EVERY DAY Patient taking differently: Take 1 mg by mouth in the morning. 04/09/23   Donna Damien NOVAK, MD  fluticasone  (FLONASE ) 50 MCG/ACT nasal spray SPRAY 1 SPRAY INTO EACH NOSTRIL EVERY DAY Patient taking differently: Place 1 spray into both nostrils in the morning. 04/16/24   [name], MD  levothyroxine  (SYNTHROID ) 25 MCG tablet TAKE 1 TABLET BY MOUTH EVERY DAY Patient taking differently: Take 25 mcg by mouth in the morning. 08/26/23   [name], MD  metoprolol  succinate (TOPROL -XL) 50 MG 24 hr tablet Take 1 tablet (50 mg total) by mouth daily. Take with or immediately following a meal. 04/23/24   Zhao, Xika, NP  nitroGLYCERIN  (NITROSTAT ) 0.4 MG SL tablet Place 1 tablet (0.4 mg total) under the tongue every 5 (five)  minutes as needed for chest pain. 04/23/24   Zhao, Xika, NP  pantoprazole  (PROTONIX ) 40 MG tablet TAKE 1 TABLET BY MOUTH EVERY DAY 02/24/24   [name], MD  prasugrel  (EFFIENT ) 10 MG TABS tablet Take 1 tablet (10 mg total) by mouth daily. 04/23/24   Zhao, Xika, NP  promethazine  (PHENERGAN ) 12.5 MG tablet Take 1 tablet (12.5 mg total) by mouth every 6 (six) hours as needed for nausea or vomiting. 04/23/24   [name], MD  rosuvastatin  (CRESTOR ) 20 MG tablet Take 1 tablet (20 mg total) by mouth daily. 04/23/24   Zhao, Xika, NP  traMADol  (ULTRAM ) 50 MG tablet Take 1 tablet (50 mg total) by mouth every 6 (six) hours as needed for severe pain (pain score 7-10). 04/23/24   [name], MD    Allergies: Onion, Other, Shellfish allergy, Amitriptyline , Amoxicillin -pot clavulanate, Amoxicillin -pot clavulanate, Cefuroxime, Cephalexin , Ciprofloxacin , Clarithromycin, Clarithromycin, Doxycycline , Isosorbide  mononitrate [isosorbide  nitrate], Propranolol , Sulfa antibiotics, Sulfasalazine, Sulfonamide derivatives, Bacitracin, Isosorbide , Ondansetron , Benadryl  [diphenhydramine  hcl (sleep)], and Shrimp flavor agent (non-screening)    Review of Systems  Updated Vital Signs BP 129/79   Pulse 60   Temp 98.8 F (37.1 C) (Oral)   Resp 16   Ht 5' (1.524 m)   Wt 84.1 kg   LMP 12/26/2004   SpO2 98%   BMI 36.21 kg/m   Physical Exam  (  all labs ordered are listed, but only abnormal results are displayed) Labs Reviewed  CBC WITH DIFFERENTIAL/PLATELET - Abnormal; Notable for the following components:      Result Value   Lymphs Abs 4.4 (*)    All other components within normal limits  COMPREHENSIVE METABOLIC PANEL WITH GFR    EKG: None  Radiology: CT ANGIO UP EXTREM RIGHT W &/OR WO CONTRAST Result Date: 05/02/2024 CLINICAL DATA:  Forearm trauma recent catheterization pain and neuropathy EXAM: CT ANGIOGRAPHY OF THE right upperEXTREMITY TECHNIQUE: Multidetector CT imaging of the right upperwas performed using the  standard protocol during bolus administration of intravenous contrast. Multiplanar CT image reconstructions and MIPs were obtained to evaluate the vascular anatomy. RADIATION DOSE REDUCTION: This exam was performed according to the departmental dose-optimization program which includes automated exposure control, adjustment of the mA and/or kV according to patient size and/or use of iterative reconstruction technique. CONTRAST:  75mL OMNIPAQUE  IOHEXOL  350 MG/ML SOLN COMPARISON:  05/01/2024 venous ultrasound FINDINGS: Vascular: Left-sided aortic arch.  Patent right brachiocephalic origin. Right subclavian, axillary and brachial arteries are patent and without evidence for dissection, aneurysm, stenosis or occlusion. There is high origin of the radial artery from the brachial artery. The brachial radial artery is patent throughout its course and without aneurysm, dissection, stenosis or occlusion. Ulnar artery is patent and without evidence for stenosis, occlusion or aneurysm. Nonvascular: There is no fracture or malalignment. 1 no abnormal intramuscular fluid collections. Mild subcutaneous edema at the proximal forearm. Review of the MIP images confirms the above findings. IMPRESSION: 1. Negative for acute arterial abnormality of the right upper extremity. Widely patent arterial vessels to the right upper extremity. 2. Mild subcutaneous edema at the proximal forearm. Electronically Signed   By: Luke Bun M.D.   On: 05/02/2024 00:46   US  Venous Img Upper Uni Right(DVT) Result Date: 05/01/2024 CLINICAL DATA:  Right arm pain status post heart catheterization. EXAM: RIGHT UPPER EXTREMITY VENOUS DOPPLER ULTRASOUND TECHNIQUE: Gray-scale sonography with graded compression, as well as color Doppler and duplex ultrasound were performed to evaluate the upper extremity deep venous system from the level of the subclavian vein and including the jugular, axillary, basilic, radial, ulnar and upper cephalic vein. Spectral  Doppler was utilized to evaluate flow at rest and with distal augmentation maneuvers. COMPARISON:  None Available. FINDINGS: Contralateral Subclavian Vein: Respiratory phasicity is normal and symmetric with the symptomatic side. No evidence of thrombus. Normal compressibility. Internal Jugular Vein: No evidence of thrombus. Normal compressibility, respiratory phasicity and response to augmentation. Subclavian Vein: No evidence of thrombus. Normal compressibility, respiratory phasicity and response to augmentation. Axillary Vein: No evidence of thrombus. Normal compressibility, respiratory phasicity and response to augmentation. Cephalic Vein: No evidence of thrombus. Normal compressibility, respiratory phasicity and response to augmentation. Basilic Vein: No evidence of thrombus. Normal compressibility, respiratory phasicity and response to augmentation. Brachial Veins: No evidence of thrombus. Normal compressibility, respiratory phasicity and response to augmentation. Radial Veins: No evidence of thrombus. Normal compressibility, respiratory phasicity and response to augmentation. Ulnar Veins: No evidence of thrombus. Normal compressibility, respiratory phasicity and response to augmentation. Venous Reflux:  None visualized. Other Findings:  None visualized. IMPRESSION: No evidence of DVT within the RIGHT upper extremity. Electronically Signed   By: Suzen Dials M.D.   On: 05/01/2024 23:01    {Document cardiac monitor, telemetry assessment procedure when appropriate:32947} Procedures   Medications Ordered in the ED  fentaNYL  (SUBLIMAZE ) injection 50 mcg (50 mcg Intravenous Given 05/01/24 2222)  iohexol  (OMNIPAQUE ) 350 MG/ML  injection 75 mL (75 mLs Intravenous Contrast Given 05/01/24 2332)      {Click here for ABCD2, HEART and other calculators REFRESH Note before signing:1}                              Medical Decision Making Amount and/or Complexity of Data Reviewed Labs: ordered. Radiology:  ordered.  Risk Prescription drug management.   ***  {Document critical care time when appropriate  Document review of labs and clinical decision tools ie CHADS2VASC2, etc  Document your independent review of radiology images and any outside records  Document your discussion with family members, caretakers and with consultants  Document social determinants of health affecting pt's care  Document your decision making why or why not admission, treatments were needed:32947:::1}   Final diagnoses:  None    ED Discharge Orders     None

## 2024-05-04 ENCOUNTER — Other Ambulatory Visit: Payer: Self-pay | Admitting: Gastroenterology

## 2024-05-04 ENCOUNTER — Other Ambulatory Visit: Payer: Self-pay

## 2024-05-04 DIAGNOSIS — K580 Irritable bowel syndrome with diarrhea: Secondary | ICD-10-CM

## 2024-05-04 MED ORDER — DICYCLOMINE HCL 10 MG PO CAPS: 10.0000 mg | ORAL_CAPSULE | Freq: Every day | ORAL | 0 refills | Status: DC | PRN

## 2024-05-04 NOTE — Progress Notes (Signed)
 Refill for bentyl  prn sent to pharmacy

## 2024-05-04 NOTE — Progress Notes (Deleted)
 Cardiology Office Note:    Date:  05/04/2024   ID:  Donna Davis, Donna Davis 1970/10/14, MRN [id number]  PCP:  [name], MD  Cardiologist:  None { Click to update primary MD,subspecialty MD or APP then REFRESH:1}    Referring MD: [name], MD   Chief Complaint: hospital follow-up of chest pain  History of Present Illness:    Donna Davis is a 53 y.o. female with a history of multivessel CAD s/p recent DES to RCA and angioplasty/ DES to LCX on 04/22/2024, palpitations with short runs of SVT noted on monitor in 03/2024, hyperlipidemia, hypothyroidism, GERD, IBS, low back pain, anxiety, and bilateral deafness who is followed by Dr. Edwyna and presents today for hospital follow-up of chest pain.   Patient was referred to Dr. Revankar on 03/19/2024 for further evaluation of palpitations and chest pain. She brought in an EKG strip that showed frequent PVCs. 2 week Zio monitor, TTE, and stress Echo were ordered for further evaluation. TTE showed LVEF of 65-70% with normal wall motion and grade 1 diastolic dysfunction, normal RV function, and no significant valvular disease. Monitor showed 9 short runs of SVT (longest run 15 beats) and rare PACs/ PVCs but no significant arrhythmias. She had a syncopal episode following removal of this monitor. She was seen by Dr Edwyna again on 04/14/2024. Stress Echo was was still pending at that time. She continued to have palpitations and chest pain but denied any recurrent chest pain. A second 15 day monitor was ordered and she was started on Toprol -XL 25mg  daily. Stress Echo on 04/17/2024 was negative. She did develop chest pain and nausea during the exam though.   She was subsequently admitted from 04/20/2024 to 04/23/2024 for persistent chest pain concerning for unstable angina since her stress Echo. LHC showed significant 3 vessel CAD involving the LAD, LCX, and RCA that all appeared hemodynamically significant by virtual FFR. She was seen by CT Surgery and seen by Dr.  Shyrl who felt the LAD lesion was non-critical and felt PCI to RCA and LCX was felt to be the better treatment option. Therefore, she was taken back to the cath lab on 04/22/2024 and underwent successful IVUS guided PCI with DES to RCA and angioplasty and DES to mid to distal LCX.   She presented back to the ED on 05/01/2024 for further evaluation of right arm pain since cath. Right upper extremity venous ultrasound was negative for DVT.  Patient presents today for follow-up. ***  CAD Recently admitted earlier this month for unstable angina. LHC showed multivessel CAD involving the LAD, LCx, and RCA. She was seen by CT Surgery but PCI was felt to be the better option and she underwent successful IVUS guided PCI with DES to RCA and angioplasty and DES to mid to distal LCX. Residual LAD disease was treated medically.  - *** - Continue DAPT with Aspirin  81mg  daily and Effient  10mg  daily.  - Continue Crestor  20mg  daily.   Palpitations Paroxysmal SVT Patient has a history of palpitation. Recent monitor in 03/2024 showed 9 short runs of SVT (longest run 15 beats) and rare PACs/ PVCs but no significant arrhythmias.  - *** - Continue Toprol -XL 50mg  daily.   Syncope Patient had a syncopal episode in 03/2024 after removing a Zio monitor which showed no significant arrhythmias. Therefore, a second 2 week monitor was ordered and *** Echo in 03/2024 showed normal LV function with no significant valvular disease.  - No recurrent syncope.  - ***  Hyperlipidemia Lipid panel on 04/22/2024: Total Cholesterol 185, Triglycerides 122, HDL 52, LDL 109. LDL goal <70 given CAD. - She was started on Crestor  20mg  daily during recent admission. Continue.  - Will need repeat lipid panel and LFTs in about 6 months. ***  EKGs/Labs/Other Studies Reviewed:    The following studies were reviewed:  Echocardiogram 03/27/2024: Impressions: 1. Left ventricular ejection fraction, by estimation, is 65 to 70%. Left   ventricular ejection fraction by 3D volume is 68 %. The left ventricle has  normal function. The left ventricle has no regional wall motion  abnormalities. Left ventricular diastolic   parameters are consistent with Grade I diastolic dysfunction (impaired  relaxation). The average left ventricular global longitudinal strain is  -20.0 %. The global longitudinal strain is normal.   2. Right ventricular systolic function is normal. The right ventricular  size is normal.   3. The mitral valve is normal in structure. Trivial mitral valve  regurgitation. No evidence of mitral stenosis.   4. The aortic valve is normal in structure. Aortic valve regurgitation is  not visualized. No aortic stenosis is present.   5. The inferior vena cava is dilated in size with >50% respiratory  variability, suggesting right atrial pressure of 8 mmHg.  _______________  Monitor 03/19/2024 to 04/02/2024: Patient had a min HR of 53 bpm, max HR of 152 bpm, and avg HR of 75 bpm.    Predominant underlying rhythm was Sinus Rhythm.    9 Supraventricular Tachycardia runs occurred, the run with the fastest interval lasting 4 beats with a max rate of 152 bpm, the  longest lasting 15 beats with an avg rate of 97 bpm. Isolated SVEs were rare (<1.0%), SVE Couplets were rare (<1.0%), and SVE Triplets were rare (<1.0%).    Isolated VEs were rare (<1.0%), VE Couplets were rare (<1.0%), and no VE Triplets were present. Ventricular Bigeminy and Trigeminy were present.    Impression: Mildly abnormal but largely unremarkable event monitor.  Brief atrial runs noted. _______________  Echo Stress Test 04/17/2024: Imrpessions: 1. This is a negative stress echocardiogram for ischemia.   2. This is a low risk study.   3. Grossly normal wall motion with stress however image quality is  reduced due to suboptimal acoustic windows for imaging. If symptoms  persist, consider alternate imaging modality for stress.  _______________  Left  Cardiac Catheterization 04/20/2024: Conclusions: Significant three-vessel coronary artery disease, as detailed below, including diffuse mid LAD disease of up to 50-60%, 90% distal LCx lesion, and sequential 60-80% proximal/mid RCA stenoses.  LAD, LCx, and RCA lesions are all hemodynamically significant by virtual FFR. Upper normal left ventricular filling pressure (LVEDP 15 mmHg).   Recommendations: Given ongoing chest pain over the last 3 days, admit for inpatient cardiac surgery consultation and revascularization (CABG versus multivessel PCI).  If the patient is not a good surgical candidate, I would favor PCI to LCx and RCA with medical therapy of LAD given long segment of mid LAD disease. Aggressive secondary prevention of coronary artery disease. Initiate heparin  infusion 2 hours after TR band has been removed.  Diagnostic Dominance: Right  _______________  Coronary Stent Intervention 04/22/2024:   Mid LAD to Dist LAD lesion is 55% stenosed.   Mid RCA lesion is 65% stenosed.   1st Diag lesion is 50% stenosed.   Prox RCA lesion is 75% stenosed.   Dist RCA lesion is 40% stenosed.   Mid Cx to Dist Cx lesion is 90% stenosed.   Mid Cx  lesion is 30% stenosed.   A drug-eluting stent was successfully placed using a STENT SYNERGY XD [id number].   A drug-eluting stent was successfully placed using a STENT SYNERGY XD [id number].   Post intervention, there is a 0% residual stenosis.   Post intervention, there is a 0% residual stenosis.   Post intervention, there is a 0% residual stenosis.   1.  Mildly elevated left ventricular end-diastolic pressure at 18 mmHg. 2.  Successful IVUS guided PCI and drug-eluting stent placement to the right coronary artery extending to the ostium.  A 2.5 mm stent was placed and postdilated to a 3.0 proximally. 3.  Successful angioplasty and drug-eluting stent placement to the mid/distal left circumflex.  The stent partially jailed in OM branch which had normal flow and  minimal stenosis at the ostium. 4.  The patient required high doses of sedatives and narcotics.   Recommendations: Dual antiplatelet therapy for at least 6 months. Aggressive treatment of risk factors.  Diagnostic Dominance: Right  Intervention       EKG:  EKG ordered today. EKG personally reviewed and demonstrates ***.  Recent Labs: 03/05/2024: Magnesium  2.0; TSH 2.370 05/01/2024: ALT 12; BUN 7; Creatinine, Ser 0.70; Hemoglobin 12.8; Platelets 248; Potassium 3.8; Sodium 141  Recent Lipid Panel    Component Value Date/Time   CHOL 185 04/22/2024 0331   TRIG 122 04/22/2024 0331   HDL 52 04/22/2024 0331   CHOLHDL 3.6 04/22/2024 0331   VLDL 24 04/22/2024 0331   LDLCALC 109 (H) 04/22/2024 0331    Physical Exam:    Vital Signs: LMP 12/26/2004     Wt Readings from Last 3 Encounters:  05/01/24 185 lb 6.5 oz (84.1 kg)  04/30/24 185 lb 6.4 oz (84.1 kg)  04/20/24 184 lb 15.3 oz (83.9 kg)     General: 53 y.o. female in no acute distress. HEENT: Normocephalic and atraumatic. Sclera clear.  Neck: Supple. No carotid bruits. No JVD. Heart: *** RRR. Distinct S1 and S2. No murmurs, gallops, or rubs.  Lungs: No increased work of breathing. Clear to ausculation bilaterally. No wheezes, rhonchi, or rales.  Abdomen: Soft, non-distended, and non-tender to palpation.  Extremities: No lower extremity edema.  Radial and distal pedal pulses 2+ and equal bilaterally. Skin: Warm and dry. Neuro: No focal deficits. Psych: Normal affect. Responds appropriately.   Assessment:    No diagnosis found.  Plan:     Disposition: Follow up in ***   Signed, Donis Kotowski E [place]afen, PA-C  05/04/2024 2:45 PM    [facility]

## 2024-05-07 NOTE — Progress Notes (Signed)
 Internal Medicine Clinic Attending  I was physically present during the key portions of the resident provided service and participated in the medical decision making of patient's management care. I reviewed pertinent patient test results.  The assessment, diagnosis, and plan were formulated together and I agree with the documentation in the resident's note.  Slight bruising of the right wrist, uses both hands for sign language, given slow resolution and importance of hand movements for her communication will see about getting her to see cardiology sooner. Rosan Dayton BROCKS, DO

## 2024-05-12 NOTE — Progress Notes (Unsigned)
 Cardiology Office Note:  .   Date:  05/14/2024  ID:  Donna Davis, Donna Davis 1971-07-11, MRN [id number] PCP: [name], MD  [facility] Providers Cardiologist:  Ozell Fell, MD {  History of Present Illness: .   Donna Davis is a 53 y.o. female  with PMHx of anxiety, hypothyroidism, GERD, deafness, migraine, back pain, palpitations (Zio 03/2024: predominately NSR with 9 episodes of SVT & <1% PAC/PVC), syncope and recent diagnose of CAD with [facility] 04/20/2024 showing 3-vessel CAD (LAD 50-60%, LCx 90%, RCA 60-80% by vFFR, LVEDP 15) then underwent staged PCI 04/22/2024 for DES to RCA extending to ostium/proximal lesion and angioplasty with DES to mid/distal Lcx with OM branch jailed but patent who reports to [facility] for follow up.   Patient was last seen in heartcare OV 04/14/2024 with Dr. Edwyna. Reported ongoing non-exertional chest pain and palpitations mentioned in previous visit. Stress ECHO already scheduled and awaiting completion. Also reported syncope that seemed unlikely to be cardiac but a second zio monitor was ordered since syncope occurred after first zio monitor was removed.   Patient presented for stress ECHO 04/17/2024 developed severe exercise limiting CP, nausea and tiredness as well as ST depression in V2, V3, and aVF. Per report, baseline EF 65%, peak EF at stress 70%, no ischemia. The DOD Dr. Michele was contacted and proceeded with ED transfer due to ongoing CP, EKG changes and symptoms. ED work up 8/1 showed normal troponin, nonischemic EKG, CXR with no acute findings. Scheduled for outpatient LHC. Started on imdur  30 mg daily. At this time, she was also wearing Zio monitor for the second time, which results are still pending.   Follow up hospitalization 8/4-03/2024:  LHC 04/20/2024 revealed significant three-vessel CAD including diffuse mid LAD 50 to 60%, 90% distal left circumflex lesion, sequential 60 to 80% proximal/mid RCA stenosis; all lesions hemodynamically significant by  virtual FFR, LVEDP 15 mmHg, she was recommended admission for CT surgery consultation for possible CABG versus multivessel PCI, decision was ultimately made to proceed with staged PCI.  Staged PCI 04/22/2024 by Dr. Darron: Successful PCI with DES to RCA extending to ostium, successful angioplasty and DES to mid/distal left circumflex, stent partially jailed in OM branch had normal flow and minimal stenosis at ostium. Recommended DAPT for at least 6 months and aggressive treatment of risk factors.  Discharged on ASA 81 mg daily, Effient  10 mg daily, Toprol  XL 50 mg daily, Crestor  20 mg daily, NTG prn. Discontinued Imdur  30 mg daily.   Today, accompanied by interpreter with history of deafness. Reports complete resolution of chest pain about 1 week after cath until this past Saturday.  CP described as constant pressure or pinching ranging from 7-10/10 pain, radiating to the left arm, started at rest. CP not worsened with exertion, however not very active since working from home. She is able to cook and clean home without worsening CP. On Saturday she took NTG x 1 which improved chest pain but did not completely resolve, however did cause HA.  Yesterday she took tramadol  and Tylenol , which improved pain better than NTG but did not completely resolve either. Notes this pain is similar to previous CP associated with recent cath except no palpitations.  Associated with nausea, flushed/clammy and SOB.  Denies vomiting, palpitations, edema, dizziness, and syncope.  Currently CP is still present as described above with 8/10 pain. Reports compliance with medications.  Endorses heart healthy diet. Denies tobacco use/alcohol/drug use  ROS: 10 point review of system  has been reviewed and considered negative except ones been listed in the HPI.   Studies Reviewed: SABRA   EKG Interpretation Date/Time:  Wednesday May 13 2024 08:52:57 EDT Ventricular Rate:  59 PR Interval:  174 QRS Duration:  82 QT Interval:  420 QTC  Calculation: 415 R Axis:   5  Text Interpretation: Sinus bradycardia When compared with ECG of 23-Apr-2024 07:09, No significant change was found Confirmed by [name] ([id number]) on 05/13/2024 9:04:33 AM   ZIO monitor 03/2024 Patch Wear Time:  14 days and 0 hours (2025-07-03T15:45:54-0400 to 2025-07-17T15:45:54-0400)   Patient had a min HR of 53 bpm, max HR of 152 bpm, and avg HR of 75 bpm.    Predominant underlying rhythm was Sinus Rhythm.    9 Supraventricular Tachycardia runs occurred, the run with the fastest interval lasting 4 beats with a max rate of 152 bpm, the  longest lasting 15 beats with an avg rate of 97 bpm. Isolated SVEs were rare (<1.0%), SVE Couplets were rare (<1.0%), and SVE Triplets were rare (<1.0%).    Isolated VEs were rare (<1.0%), VE Couplets were rare (<1.0%), and no VE Triplets were present. Ventricular Bigeminy and Trigeminy were present.    Impression: Mildly abnormal but largely unremarkable event monitor.  Brief atrial runs noted.  Stress ECHO IMPRESSIONS 04/17/2024  1. This is a negative stress echocardiogram for ischemia.   2. This is a low risk study.   3. Grossly normal wall motion with stress however image quality is  reduced due to suboptimal acoustic windows for imaging. If symptoms  persist, consider alternate imaging modality for stress.   Left heart catheterization 04/20/2024:  Conclusions: Significant three-vessel coronary artery disease, as detailed below, including diffuse mid LAD disease of up to 50-60%, 90% distal LCx lesion, and sequential 60-80% proximal/mid RCA stenoses.  LAD, LCx, and RCA lesions are all hemodynamically significant by virtual FFR. Upper normal left ventricular filling pressure (LVEDP 15 mmHg).   Recommendations: Given ongoing chest pain over the last 3 days, admit for inpatient cardiac surgery consultation and revascularization (CABG versus multivessel PCI).  If the patient is not a good surgical candidate, I would  favor PCI to LCx and RCA with medical therapy of LAD given long segment of mid LAD disease. Aggressive secondary prevention of coronary artery disease. Initiate heparin  infusion 2 hours after TR band has been removed.  Staged PCI 04/22/2024:     Mid LAD to Dist LAD lesion is 55% stenosed.   Mid RCA lesion is 65% stenosed.   1st Diag lesion is 50% stenosed.   Prox RCA lesion is 75% stenosed.   Dist RCA lesion is 40% stenosed.   Mid Cx to Dist Cx lesion is 90% stenosed.   Mid Cx lesion is 30% stenosed.   A drug-eluting stent was successfully placed using a STENT SYNERGY XD [id number].   A drug-eluting stent was successfully placed using a STENT SYNERGY XD [id number].   Post intervention, there is a 0% residual stenosis.   Post intervention, there is a 0% residual stenosis.   Post intervention, there is a 0% residual stenosis.   1.  Mildly elevated left ventricular end-diastolic pressure at 18 mmHg. 2.  Successful IVUS guided PCI and drug-eluting stent placement to the right coronary artery extending to the ostium.  A 2.5 mm stent was placed and postdilated to a 3.0 proximally. 3.  Successful angioplasty and drug-eluting stent placement to the mid/distal left circumflex.  The stent partially jailed in OM branch  which had normal flow and minimal stenosis at the ostium. 4.  The patient required high doses of sedatives and narcotics.   Recommendations: Dual antiplatelet therapy for at least 6 months. Aggressive treatment of risk factors.  Physical Exam:   VS:  BP 120/80   Pulse 61   Ht 5' (1.524 m)   Wt 183 lb 12.8 oz (83.4 kg)   LMP 12/26/2004   SpO2 100%   BMI 35.90 kg/m    Wt Readings from Last 3 Encounters:  05/13/24 183 lb 12.8 oz (83.4 kg)  05/01/24 185 lb 6.5 oz (84.1 kg)  04/30/24 185 lb 6.4 oz (84.1 kg)    GEN: Well nourished, well developed in no acute distress while sitting in chair.  NECK: No JVD; No carotid bruits CARDIAC: RRR, no murmurs, rubs, gallops; reproducible CP in  midsternum area on left side of chest per Dr. Ladona exam  RESPIRATORY:  Clear to auscultation without rales, wheezing or rhonchi  ABDOMEN: Soft, non-tender, non-distended EXTREMITIES:  No edema; No deformity   ASSESSMENT AND PLAN: .   Coronary artery disease involving native heart with unstable angina pectoris, unspecified vessel or lesion type (HCC) HLD, LDL goal < 70  Chest pain Noted history of atypical nonexertional chest pain at OV on 03/19/2024 with Dr. Edwyna. Stress ECHO ordered.  Patient presented for stress ECHO 04/17/2024 developed severe exercise limiting CP, nausea and tiredness as well as ST depression in V2, V3, and aVF. Per report, baseline EF 65%, peak EF at stress 70%, no ischemia. The DOD Dr. Michele was contacted and proceed to transfer to ED due to ongoing CP, EKG changes and symptoms. ED work up 8/1 showed normal troponin, nonischemic EKG, CXR with no acute findings. Scheduled for outpatient LHC. Started on imdur  30 mg daily.  LHC 04/20/2024 revealed significant three-vessel CAD including diffuse mid LAD 50 to 60%, 90% distal left circumflex lesion, sequential 60 to 80% proximal/mid RCA stenosis; all lesions hemodynamically significant by virtual FFR, LVEDP 15 mmHg, she was recommended admission for CT surgery consultation for possible CABG versus multivessel PCI, decision was ultimately made to proceed with staged PCI.  Staged PCI 04/22/2024 by Dr. Darron: DES to RCA extending to ostium/proximal lesion and angioplasty with DES to mid/distal Lcx with OM branch Successful PCI with DES to RCA extending to ostium/proximal lesion and angioplasty with DES to mid/distal Lcx, stent partially jailed in OM branch had normal flow and minimal stenosis at ostium. Recommended DAPT for at least 6 months and aggressive treatment of risk factors.  Today, reports chest pain initially resolved after cath but she has had recurrent chest pain that sounds concerning for possible angina over the last few days  with 8/10 chest pain in the office. EKG today showed NSR w/o any acute ischemic changes.  Concerned for possible cardiac chest pain with history of multiple vessel CAD s/p DES x2 as above in the setting of normal troponin and nonischemic EKG change.  Discussed patient presentation and history as above with Dr. Ladona. Accompanied Dr. Ladona for further history and exam with similar findings as above except patient reports worsening CP with deep breaths and CP is reproducible on his exam. Dr. Ladona also reviewed EKG and agreed no significant EKG changes, which would be suspected if infarct occurred with 5 days of constant CP. Per Ladona, suspect most likely MSK. Order outpatient troponin STAT and amlodipine  5 mg for additional antianginal medication. Patient is not in favor of repeat relook cath but explained If troponin  elevated then would highly recommend ED evaluation for trending troponin and possible cath. Patient understands and agrees.  Discussed ED precautions.  Continue on ASA 81 mg daily, Effient  10 mg daily, Toprol  XL 50 mg daily, Crestor  20 mg daily, NTG prn. Discontinued imdur  due to severe migraines Patient is not appropriate to begin cardiac rehab with ongoing chest pain as above. Can reassess at follow up appointment in 2 weeks.   ADDENDUM: Troponin was normal <6. Reviewed lab with Dr. Ladona and recommended to offer outpatient cath since symptoms suggestive of cardiac and MSK. I called patient to offer cath but patient prefers to decline for now. Patient does not agree that pain is related to MSK since very similar to previous pain but still continues to decline cath. Discussed the only 100% definitive way to determine if related to cardiac cause is the cardiac cath but patient continues to decline. Discussed ED precautions. Patient will call office back if she decides to proceed with outpatient cath.   Palpitations Zio monitor 7/3-17/2025: predominately NSR with avg HR of 75, HR ranging from  53-152, 9 episodes of SVT with fastest 4 beats & max HR of 152 and longest 15 beats & avg HR of 97, <1% PAC/PVC.  Repeat ZIO monitor still pending. Denies any palpitations.   Syncope and collapse Zio 7/3-17/2025 as above.  Previous episode of syncope reported with cardio OV on 04/14/2024 with Dr. Edwyna. Suspected less likely related to cardiac. Ordered zio monitor, which is still pending. Noted also being evaluated by PCP and neuro.  Denies any recurrent syncope episodes.   Cardiac murmur Noted 2/6 systolic murmur at the apex at OV on 03/19/2024 with Dr. Edwyna. ECHO 03/27/2024: trivial MV regurgitations, no other valvular heart abnormalities.   No murmur on exam today.   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 Edwyna. Denies any palpitations No PVCs noted on EKG today.  Cardiac Rehabilitation Eligibility Assessment  The patient is NOT ready to start cardiac rehabilitation due to: The patient is experiencing unstable angina.       Dispo: Follow up in 2 weeks with any APP.   Signed, Lorette CINDERELLA Kapur, PA-C

## 2024-05-13 ENCOUNTER — Ambulatory Visit: Attending: Student | Admitting: Physician Assistant

## 2024-05-13 ENCOUNTER — Encounter: Payer: Self-pay | Admitting: Student

## 2024-05-13 ENCOUNTER — Other Ambulatory Visit (HOSPITAL_COMMUNITY): Payer: Self-pay

## 2024-05-13 ENCOUNTER — Encounter: Payer: Self-pay | Admitting: Physician Assistant

## 2024-05-13 VITALS — BP 120/80 | HR 61 | Ht 60.0 in | Wt 183.8 lb

## 2024-05-13 DIAGNOSIS — R079 Chest pain, unspecified: Secondary | ICD-10-CM

## 2024-05-13 DIAGNOSIS — E785 Hyperlipidemia, unspecified: Secondary | ICD-10-CM

## 2024-05-13 DIAGNOSIS — I25110 Atherosclerotic heart disease of native coronary artery with unstable angina pectoris: Secondary | ICD-10-CM

## 2024-05-13 DIAGNOSIS — R002 Palpitations: Secondary | ICD-10-CM

## 2024-05-13 DIAGNOSIS — R55 Syncope and collapse: Secondary | ICD-10-CM

## 2024-05-13 DIAGNOSIS — R011 Cardiac murmur, unspecified: Secondary | ICD-10-CM

## 2024-05-13 DIAGNOSIS — I493 Ventricular premature depolarization: Secondary | ICD-10-CM

## 2024-05-13 LAB — TROPONIN T: Troponin T (Highly Sensitive): <6 ng/L (ref 0–14)

## 2024-05-13 MED ORDER — AMLODIPINE BESYLATE 5 MG PO TABS
5.0000 mg | ORAL_TABLET | Freq: Every day | ORAL | 0 refills | Status: DC
  Filled 2024-05-13 (×2): qty 90, 90d supply, fill #0

## 2024-05-13 MED ORDER — AMLODIPINE BESYLATE 5 MG PO TABS: 5.0000 mg | ORAL_TABLET | Freq: Every day | ORAL | 0 refills | Status: DC

## 2024-05-13 NOTE — Patient Instructions (Addendum)
 Medication Instructions:  Your physician has recommended you make the following change in your medication:  Please start Amlodipine  5 Mg daily   Labwork: Today at our lab- [facility]   Testing/Procedures: None   Follow-Up: Your physician recommends that you schedule a follow-up appointment in: 2 weeks with any APP  One of our Advanced Practice Providers (APPs): Morse Clause, PA-C  Lamarr Satterfield, NP Miriam Shams, NP  Olivia Pavy, PA-C Josefa Beauvais, NP  Leontine Salen, PA-C Orren Fabry, PA-C  Fordyce, PA-C Ernest Dick, NP  Damien Braver, NP Jon Hails, PA-C  Waddell Donath, PA-C    Dayna Dunn, PA-C  Scott Weaver, PA-C Lum Louis, NP Katlyn West, NP Callie Goodrich, PA-C  Evan Williams, PA-C Sheng Haley, PA-C  [name], NP Kathleen Johnson, PA-C  Any Other Special Instructions Will Be Listed Below (If Applicable).  If you need a refill on your cardiac medications before your next appointment, please call your pharmacy.

## 2024-05-14 ENCOUNTER — Encounter: Payer: Self-pay | Admitting: Physician Assistant

## 2024-05-19 ENCOUNTER — Other Ambulatory Visit: Payer: Self-pay | Admitting: Internal Medicine

## 2024-05-19 ENCOUNTER — Other Ambulatory Visit (HOSPITAL_COMMUNITY): Payer: Self-pay

## 2024-05-19 DIAGNOSIS — N951 Menopausal and female climacteric states: Secondary | ICD-10-CM

## 2024-05-19 NOTE — Telephone Encounter (Unsigned)
 Copied from CRM #[id number]. Topic: Clinical - Medication Question >> May 19, 2024  3:43 PM Cherylann RAMAN wrote: Reason for CRM: Patient returning call to Dr. Ronnald Sergeant. Reached out to CAL but phones are off. Informed patient will have provider to reach back out tomorrow regarding prescription refills. Please contact patient at [phone] or her work [phone] if no answer please leave detailed message as she may be in a meeting.

## 2024-05-20 ENCOUNTER — Telehealth: Payer: Self-pay | Admitting: Cardiovascular Disease

## 2024-05-20 NOTE — Telephone Encounter (Signed)
 Former patient of Dr. Edwyna. Patient has had ONE outpatient cardiology visit with APP since 04/17/24. Close follow up scheduled with APP due to availability  Will route to MD to see if 7 day hold slot on 06/03/24 can be utilized.

## 2024-05-20 NOTE — Telephone Encounter (Signed)
 Discussed with Alysiah that estradiol  is contraindicated in patient's with CAD and given the recent discovery of her significant heart disease this would not be safe to continue. We discussed alternatives for menopausal symptoms including SSRI/SNRI/gabapentin  however she has had side effects from these previously. She is agreeable to see a gynecologist to discuss potential alternatives as she is worried about potential mood changes with these changes. I will attempt to place this urgently.

## 2024-05-20 NOTE — Telephone Encounter (Signed)
 Pt was seen in ER by Dr. Wonda on 8/1 and he agreed to be her general cardiologist, but pt has only seen APPs for follow up visits since then. Pt would like to know why she hasn't seen Dr. Wonda yet for an official office visit and is only being referred to APPs for follow up visits. Please advise.

## 2024-05-25 NOTE — Telephone Encounter (Signed)
 Scheduled 06/03/24 with Dr. Wonda -- Alice by MD

## 2024-05-27 ENCOUNTER — Ambulatory Visit: Admitting: Physician Assistant

## 2024-05-29 ENCOUNTER — Inpatient Hospital Stay (HOSPITAL_COMMUNITY)
Admission: EM | Admit: 2024-05-29 | Discharge: 2024-06-02 | DRG: 287 | Disposition: A | Discharge status: Home / Self Care | Attending: Internal Medicine | Admitting: Internal Medicine

## 2024-05-29 ENCOUNTER — Emergency Department (HOSPITAL_COMMUNITY)

## 2024-05-29 ENCOUNTER — Other Ambulatory Visit: Payer: Self-pay

## 2024-05-29 DIAGNOSIS — I2510 Atherosclerotic heart disease of native coronary artery without angina pectoris: Secondary | ICD-10-CM | POA: Diagnosis present

## 2024-05-29 DIAGNOSIS — Z87891 Personal history of nicotine dependence: Secondary | ICD-10-CM | POA: Diagnosis not present

## 2024-05-29 DIAGNOSIS — Z800 Family history of malignant neoplasm of digestive organs: Secondary | ICD-10-CM | POA: Diagnosis not present

## 2024-05-29 DIAGNOSIS — G720 Drug-induced myopathy: Secondary | ICD-10-CM | POA: Diagnosis present

## 2024-05-29 DIAGNOSIS — Z823 Family history of stroke: Secondary | ICD-10-CM

## 2024-05-29 DIAGNOSIS — I10 Essential (primary) hypertension: Secondary | ICD-10-CM | POA: Diagnosis present

## 2024-05-29 DIAGNOSIS — Z79890 Hormone replacement therapy (postmenopausal): Secondary | ICD-10-CM

## 2024-05-29 DIAGNOSIS — Z818 Family history of other mental and behavioral disorders: Secondary | ICD-10-CM

## 2024-05-29 DIAGNOSIS — E785 Hyperlipidemia, unspecified: Secondary | ICD-10-CM | POA: Diagnosis present

## 2024-05-29 DIAGNOSIS — Z91013 Allergy to seafood: Secondary | ICD-10-CM

## 2024-05-29 DIAGNOSIS — E039 Hypothyroidism, unspecified: Secondary | ICD-10-CM | POA: Diagnosis present

## 2024-05-29 DIAGNOSIS — Z882 Allergy status to sulfonamides status: Secondary | ICD-10-CM

## 2024-05-29 DIAGNOSIS — G43909 Migraine, unspecified, not intractable, without status migrainosus: Secondary | ICD-10-CM | POA: Diagnosis not present

## 2024-05-29 DIAGNOSIS — Z8679 Personal history of other diseases of the circulatory system: Secondary | ICD-10-CM | POA: Diagnosis not present

## 2024-05-29 DIAGNOSIS — Z9621 Cochlear implant status: Secondary | ICD-10-CM | POA: Diagnosis present

## 2024-05-29 DIAGNOSIS — F419 Anxiety disorder, unspecified: Secondary | ICD-10-CM | POA: Diagnosis not present

## 2024-05-29 DIAGNOSIS — I25118 Atherosclerotic heart disease of native coronary artery with other forms of angina pectoris: Secondary | ICD-10-CM | POA: Diagnosis not present

## 2024-05-29 DIAGNOSIS — Z8249 Family history of ischemic heart disease and other diseases of the circulatory system: Secondary | ICD-10-CM

## 2024-05-29 DIAGNOSIS — T466X5A Adverse effect of antihyperlipidemic and antiarteriosclerotic drugs, initial encounter: Secondary | ICD-10-CM | POA: Diagnosis present

## 2024-05-29 DIAGNOSIS — Z9049 Acquired absence of other specified parts of digestive tract: Secondary | ICD-10-CM

## 2024-05-29 DIAGNOSIS — Z6835 Body mass index (BMI) 35.0-35.9, adult: Secondary | ICD-10-CM | POA: Diagnosis not present

## 2024-05-29 DIAGNOSIS — R0789 Other chest pain: Secondary | ICD-10-CM | POA: Diagnosis not present

## 2024-05-29 DIAGNOSIS — Z833 Family history of diabetes mellitus: Secondary | ICD-10-CM | POA: Diagnosis not present

## 2024-05-29 DIAGNOSIS — F411 Generalized anxiety disorder: Secondary | ICD-10-CM | POA: Diagnosis present

## 2024-05-29 DIAGNOSIS — I25119 Atherosclerotic heart disease of native coronary artery with unspecified angina pectoris: Secondary | ICD-10-CM | POA: Diagnosis not present

## 2024-05-29 DIAGNOSIS — R079 Chest pain, unspecified: Secondary | ICD-10-CM | POA: Diagnosis present

## 2024-05-29 DIAGNOSIS — Z79899 Other long term (current) drug therapy: Secondary | ICD-10-CM

## 2024-05-29 DIAGNOSIS — M791 Myalgia, unspecified site: Secondary | ICD-10-CM | POA: Diagnosis not present

## 2024-05-29 DIAGNOSIS — Z803 Family history of malignant neoplasm of breast: Secondary | ICD-10-CM

## 2024-05-29 DIAGNOSIS — Z7982 Long term (current) use of aspirin: Secondary | ICD-10-CM | POA: Diagnosis not present

## 2024-05-29 DIAGNOSIS — Z9861 Coronary angioplasty status: Secondary | ICD-10-CM | POA: Diagnosis not present

## 2024-05-29 DIAGNOSIS — E669 Obesity, unspecified: Secondary | ICD-10-CM | POA: Diagnosis present

## 2024-05-29 DIAGNOSIS — R002 Palpitations: Secondary | ICD-10-CM | POA: Diagnosis present

## 2024-05-29 DIAGNOSIS — Z91018 Allergy to other foods: Secondary | ICD-10-CM

## 2024-05-29 DIAGNOSIS — I25111 Atherosclerotic heart disease of native coronary artery with angina pectoris with documented spasm: Secondary | ICD-10-CM | POA: Diagnosis not present

## 2024-05-29 DIAGNOSIS — Z841 Family history of disorders of kidney and ureter: Secondary | ICD-10-CM | POA: Diagnosis not present

## 2024-05-29 DIAGNOSIS — I25110 Atherosclerotic heart disease of native coronary artery with unstable angina pectoris: Principal | ICD-10-CM | POA: Diagnosis present

## 2024-05-29 DIAGNOSIS — H9193 Unspecified hearing loss, bilateral: Secondary | ICD-10-CM | POA: Diagnosis present

## 2024-05-29 DIAGNOSIS — I493 Ventricular premature depolarization: Secondary | ICD-10-CM | POA: Diagnosis present

## 2024-05-29 DIAGNOSIS — K219 Gastro-esophageal reflux disease without esophagitis: Secondary | ICD-10-CM | POA: Diagnosis present

## 2024-05-29 DIAGNOSIS — Z888 Allergy status to other drugs, medicaments and biological substances status: Secondary | ICD-10-CM

## 2024-05-29 DIAGNOSIS — Z881 Allergy status to other antibiotic agents status: Secondary | ICD-10-CM

## 2024-05-29 DIAGNOSIS — Z90710 Acquired absence of both cervix and uterus: Secondary | ICD-10-CM

## 2024-05-29 DIAGNOSIS — Z7902 Long term (current) use of antithrombotics/antiplatelets: Secondary | ICD-10-CM

## 2024-05-29 DIAGNOSIS — I2089 Other forms of angina pectoris: Principal | ICD-10-CM | POA: Diagnosis present

## 2024-05-29 DIAGNOSIS — K589 Irritable bowel syndrome without diarrhea: Secondary | ICD-10-CM | POA: Diagnosis present

## 2024-05-29 DIAGNOSIS — Z880 Allergy status to penicillin: Secondary | ICD-10-CM

## 2024-05-29 DIAGNOSIS — Z955 Presence of coronary angioplasty implant and graft: Secondary | ICD-10-CM | POA: Diagnosis not present

## 2024-05-29 DIAGNOSIS — R5383 Other fatigue: Secondary | ICD-10-CM | POA: Diagnosis not present

## 2024-05-29 DIAGNOSIS — Z8041 Family history of malignant neoplasm of ovary: Secondary | ICD-10-CM

## 2024-05-29 LAB — CBC WITH DIFFERENTIAL/PLATELET
Abs Immature Granulocytes: 0.04 K/uL (ref 0.00–0.07)
Basophils Absolute: 0 K/uL (ref 0.0–0.1)
Basophils Relative: 1 %
Eosinophils Absolute: 0.2 K/uL (ref 0.0–0.5)
Eosinophils Relative: 2 %
HCT: 41.1 % (ref 36.0–46.0)
Hemoglobin: 13.7 g/dL (ref 12.0–15.0)
Immature Granulocytes: 1 %
Lymphocytes Relative: 40 %
Lymphs Abs: 3.5 K/uL (ref 0.7–4.0)
MCH: 30.3 pg (ref 26.0–34.0)
MCHC: 33.3 g/dL (ref 30.0–36.0)
MCV: 90.9 fL (ref 80.0–100.0)
Monocytes Absolute: 0.6 K/uL (ref 0.1–1.0)
Monocytes Relative: 7 %
Neutro Abs: 4.3 K/uL (ref 1.7–7.7)
Neutrophils Relative %: 49 %
Platelets: 270 K/uL (ref 150–400)
RBC: 4.52 MIL/uL (ref 3.87–5.11)
RDW: 11.7 % (ref 11.5–15.5)
WBC: 8.6 K/uL (ref 4.0–10.5)
nRBC: 0 % (ref 0.0–0.2)

## 2024-05-29 LAB — I-STAT CHEM 8, ED
BUN: 17 mg/dL (ref 6–20)
Calcium, Ion: 1.09 mmol/L — ABNORMAL LOW (ref 1.15–1.40)
Chloride: 103 mmol/L (ref 98–111)
Creatinine, Ser: 0.7 mg/dL (ref 0.44–1.00)
Glucose, Bld: 91 mg/dL (ref 70–99)
HCT: 40 % (ref 36.0–46.0)
Hemoglobin: 13.6 g/dL (ref 12.0–15.0)
Potassium: 4.4 mmol/L (ref 3.5–5.1)
Sodium: 135 mmol/L (ref 135–145)
TCO2: 21 mmol/L — ABNORMAL LOW (ref 22–32)

## 2024-05-29 LAB — COMPREHENSIVE METABOLIC PANEL WITH GFR
ALT: 15 U/L (ref 0–44)
AST: 21 U/L (ref 15–41)
Albumin: 4.2 g/dL (ref 3.5–5.0)
Alkaline Phosphatase: 42 U/L (ref 38–126)
Anion gap: 12 (ref 5–15)
BUN: 15 mg/dL (ref 6–20)
CO2: 21 mmol/L — ABNORMAL LOW (ref 22–32)
Calcium: 9.7 mg/dL (ref 8.9–10.3)
Chloride: 102 mmol/L (ref 98–111)
Creatinine, Ser: 0.73 mg/dL (ref 0.44–1.00)
GFR, Estimated: >60 mL/min (ref >60)
Glucose, Bld: 90 mg/dL (ref 70–99)
Potassium: 4.5 mmol/L (ref 3.5–5.1)
Sodium: 135 mmol/L (ref 135–145)
Total Bilirubin: 0.6 mg/dL (ref 0.0–1.2)
Total Protein: 7.3 g/dL (ref 6.5–8.1)

## 2024-05-29 LAB — TROPONIN I (HIGH SENSITIVITY)
Troponin I (High Sensitivity): 3 ng/L (ref <18)
Troponin I (High Sensitivity): 3 ng/L (ref <18)

## 2024-05-29 MED ORDER — PRASUGREL HCL 10 MG PO TABS
10.0000 mg | ORAL_TABLET | Freq: Every day | ORAL | Status: DC
  Administered 2024-05-30 – 2024-06-02 (×3): 10 mg via ORAL
  Filled 2024-05-29 (×7): qty 1

## 2024-05-29 MED ORDER — ASPIRIN 81 MG PO CHEW
CHEWABLE_TABLET | ORAL | Status: AC
  Filled 2024-05-29: qty 3

## 2024-05-29 MED ORDER — AMLODIPINE BESYLATE 5 MG PO TABS
5.0000 mg | ORAL_TABLET | Freq: Every day | ORAL | Status: DC
Start: 2024-05-30 — End: 2024-05-30

## 2024-05-29 MED ORDER — ASPIRIN 81 MG PO TBEC
81.0000 mg | DELAYED_RELEASE_TABLET | Freq: Every day | ORAL | Status: DC
  Administered 2024-05-30 – 2024-06-02 (×4): 81 mg via ORAL
  Filled 2024-05-29 (×5): qty 1

## 2024-05-29 MED ORDER — HEPARIN SODIUM (PORCINE) 5000 UNIT/ML IJ SOLN
5000.0000 [IU] | Freq: Three times a day (TID) | INTRAMUSCULAR | Status: DC
  Administered 2024-05-29 – 2024-06-01 (×8): 5000 [IU] via SUBCUTANEOUS
  Filled 2024-05-29 (×8): qty 1

## 2024-05-29 MED ORDER — MORPHINE SULFATE (PF) 4 MG/ML IV SOLN
4.0000 mg | Freq: Once | INTRAVENOUS | Status: AC
  Administered 2024-05-29: 4 mg via INTRAVENOUS
  Filled 2024-05-29: qty 1

## 2024-05-29 MED ORDER — NITROGLYCERIN 0.4 MG SL SUBL
0.4000 mg | SUBLINGUAL_TABLET | SUBLINGUAL | Status: DC | PRN
  Administered 2024-05-29: 0.4 mg via SUBLINGUAL
  Filled 2024-05-29 (×2): qty 1

## 2024-05-29 MED ORDER — BUSPIRONE HCL 5 MG PO TABS
20.0000 mg | ORAL_TABLET | Freq: Three times a day (TID) | ORAL | Status: DC
  Administered 2024-05-29 – 2024-06-02 (×12): 20 mg via ORAL
  Filled 2024-05-29 (×4): qty 4
  Filled 2024-05-29 (×2): qty 2
  Filled 2024-05-29 (×6): qty 4

## 2024-05-29 MED ORDER — ROSUVASTATIN CALCIUM 20 MG PO TABS
20.0000 mg | ORAL_TABLET | Freq: Every day | ORAL | Status: DC
Start: 2024-05-30 — End: 2024-06-01
  Administered 2024-05-30 – 2024-06-01 (×3): 20 mg via ORAL
  Filled 2024-05-29 (×3): qty 1

## 2024-05-29 MED ORDER — ASPIRIN 81 MG PO CHEW
243.0000 mg | CHEWABLE_TABLET | ORAL | Status: AC
  Administered 2024-05-29: 243 mg via ORAL

## 2024-05-29 MED ORDER — IOHEXOL 350 MG/ML SOLN
75.0000 mL | Freq: Once | INTRAVENOUS | Status: AC | PRN
  Administered 2024-05-29: 75 mL via INTRAVENOUS

## 2024-05-29 MED ORDER — ACETAMINOPHEN 650 MG RE SUPP: 650.0000 mg | Freq: Four times a day (QID) | RECTAL | Status: DC | PRN

## 2024-05-29 MED ORDER — SENNOSIDES-DOCUSATE SODIUM 8.6-50 MG PO TABS: 1.0000 | ORAL_TABLET | Freq: Every evening | ORAL | Status: DC | PRN

## 2024-05-29 MED ORDER — PANTOPRAZOLE SODIUM 40 MG PO TBEC
40.0000 mg | DELAYED_RELEASE_TABLET | Freq: Every day | ORAL | Status: DC
  Administered 2024-05-30 – 2024-06-02 (×4): 40 mg via ORAL
  Filled 2024-05-29 (×2): qty 1
  Filled 2024-05-29: qty 2
  Filled 2024-05-29: qty 1

## 2024-05-29 MED ORDER — SODIUM CHLORIDE 0.9 % IV SOLN
12.5000 mg | Freq: Four times a day (QID) | INTRAVENOUS | Status: DC | PRN
  Administered 2024-05-29: 12.5 mg via INTRAVENOUS
  Filled 2024-05-29: qty 12.5

## 2024-05-29 MED ORDER — METOPROLOL SUCCINATE ER 50 MG PO TB24
50.0000 mg | ORAL_TABLET | Freq: Every day | ORAL | Status: DC
Start: 2024-05-30 — End: 2024-06-01
  Administered 2024-05-30 – 2024-06-01 (×3): 50 mg via ORAL
  Filled 2024-05-29: qty 1
  Filled 2024-05-29: qty 2
  Filled 2024-05-29: qty 1

## 2024-05-29 MED ORDER — CLONAZEPAM 0.5 MG PO TABS
0.5000 mg | ORAL_TABLET | Freq: Every evening | ORAL | Status: DC | PRN
  Administered 2024-05-29 – 2024-06-01 (×5): 0.5 mg via ORAL
  Filled 2024-05-29 (×5): qty 1

## 2024-05-29 MED ORDER — ACETAMINOPHEN 500 MG PO TABS
1000.0000 mg | ORAL_TABLET | Freq: Four times a day (QID) | ORAL | Status: DC | PRN
  Administered 2024-05-30 – 2024-06-01 (×3): 1000 mg via ORAL
  Filled 2024-05-29 (×4): qty 2

## 2024-05-29 MED ORDER — LEVOTHYROXINE SODIUM 50 MCG PO TABS
25.0000 ug | ORAL_TABLET | Freq: Every day | ORAL | Status: DC
  Administered 2024-05-30 – 2024-06-02 (×4): 25 ug via ORAL
  Filled 2024-05-29 (×4): qty 1

## 2024-05-29 MED ORDER — TRAMADOL HCL 50 MG PO TABS
50.0000 mg | ORAL_TABLET | Freq: Four times a day (QID) | ORAL | Status: DC | PRN
  Administered 2024-05-29 – 2024-06-02 (×10): 50 mg via ORAL
  Filled 2024-05-29 (×12): qty 1

## 2024-05-29 NOTE — ED Provider Triage Note (Signed)
 Emergency Medicine Provider Triage Evaluation Note  Donna Davis , a 53 y.o. female  was evaluated in triage.  Pt complains of chest pain for the past few days, but has worsened since 0230 today. She reports it feels like it is behind her left breast going down into her left arm and into her back.  She reports that she feels a fluttering in her chest like she had with her last episodes of chest pain.  She was try to get through her grandson's presentation today before coming into the emergency department.  She reports that she feels some shortness of breath.  Describes it as a pressure feeling as well.  Interpreter service using triage.  Review of Systems  Positive:  Negative:   Physical Exam  BP 135/82 (BP Location: Left Arm)   Pulse 70   Temp 97.8 F (36.6 C)   Resp 18   Ht 5' (1.524 m)   Wt 83.4 kg   LMP 12/26/2004   SpO2 100%   BMI 35.91 kg/m  Gen:   Awake, diaphoretic, uncomfortable.  Resp:  Normal effort  MSK:   Moves extremities without difficulty  Other:  Palpable pulses. RRR.  Medical Decision Making  Medically screening exam initiated at 1:57 PM.  Appropriate orders placed.  Donna Davis was informed that the remainder of the evaluation will be completed by another provider, this initial triage assessment does not replace that evaluation, and the importance of remaining in the ED until their evaluation is complete.  1:57 PM Charge nurse, Burnard Feeling, alerted that the patient needs to be roomed NOW. Will keep patient in triage room till room in the main ED department becomes available. She had her 81mg  dose of ASA this morning. Will order her 243mg  dose. She had nitroglycerin  twice this AM and did not feel any changes. BP borderline in triage, will hold.    Bernis Ernst, PA-C 05/29/24 1400

## 2024-05-29 NOTE — H&P (Cosign Needed)
 Date: 05/29/2024               Patient Name:  Donna Davis MRN: [id number]  DOB: 1971/07/06 Age / Sex: 53 y.o., female   PCP: [name], MD         Medical Service: Internal Medicine Teaching Service         Attending Physician: Dr. Reyes Fenton      First Contact: Letha Cheadle, MD}    Second Contact: Dr. Libby Blanch, DO          Pager Information: First Contact Pager: [phone]   Second Contact Pager: [phone]   SUBJECTIVE   Chief Complaint: Chest pain  History of Present Illness: Donna Davis is a 53 y.o. 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  Pertinent cardiac history include posthospitalization and stenting for three-vessel coronary artery disease with 2 drug-eluting stents in RCA and mid/distal left circumflex 04/20/2024.  -LHC 04/22/2024: mid LAD to distal LAD lesion 55% stenosed, mid RCA lesion 65% stenosed, first diagonal lesion 50% stenosed, proximal RCA lesion 75% stenosed, distal RCA lesion 40% stenosed, mid circumflex lesion 30% stenosed, Mid circumflex to distal circumflex lesion 90% stenosed  -Post intervention 0% residual stenosis to mid/distal left circumflex and RCA.    Presents to ED with about 24 hours of new/worsening chest pain since her last hospitalization and stent placement 04/20/2024.  She states that the chest pain is more pressure on the left side that radiates around to her left back.  It is described as throbbing and associated with left arm tingling.  The pain is off and on and worse with activity such as walking or stress that increases her heart rate.  On her Apple Watch during these episodes her heart rate gets as high as 90.  In the past 24 hours she has had 4 distinct worsening episodes requiring nitroglycerin  tablets-this medication provides minimal relief, most relief comes with lowering of heart rate and rest.  First episode was early last night, second  episode was around 2:30 in the morning (awoke with pain), third episode was before going to see grandson at school for presentation, fourth episode while in the ED. Additional symptoms within the past 24 hours include shortness of breath, heart fluttering, nausea, muscle aches, fatigue, and headache with nitroglycerin .  Denies: Fevers, chills, vision changes, vomiting, urinary symptoms, abdominal pain, constipation, diarrhea, new rashes  Chest pain being the primary complaint, the patient secondary complaint has been worsening fatigue over the past month since last hospitalization.  She states that this has really been interfering with her quality of life as all she does is eat and sleep.  She has been unable to spend time with her grandson due to the fatigue.  She notes that this might be correlated with the timing she started metoprolol  and rosuvastatin .  Additional concern from patient is that it could be due to being off her estradiol  for the past 8 days.  ED Course: Labs significant for CBC without leukocytosis or anemia.  CMP unremarkable.  Troponin 3 secondary troponin 3. Imaging: Chest x-ray no acute cardiopulmonary findings, no focal consolidation, pleural effusion, pneumothorax.  CTA negative for acute pulmonary embolus or aortic dissection.  Borderline to mild cardiomegaly with multivessel coronary vascular calcification.  Echo ordered Received morphine  4 mg, Phenergan  12.5 mg, nitroglycerin  0.4 mg Consulted cardiology  Meds:  Patient reported: taking in past 24 hours -Amlodipine  5 mg -YES -Vitamin C -  YES -ASA 81 mg -YES -Buspar  20 mg TID - YES -Klonopin  0.5 mg at bedtime prn (LAST NIGHT) -Levothyroxine  25 mcg - YES -metoprolol  XL 50 mg - YES -SL nitroglycerin  -Protonix  40 mg -YES -Prasugrel  10 mg -YES -Phenergan  12.5 mg PRN -Not taking daily (LAST 3 DAYS) -Rosuvastatin /CRESTOR  20 mg -YES -Tramadol  50 mg Q6H PRN -YES (THIS AM) -Stool softener  Current Meds  Medication Sig    acetaminophen  (TYLENOL ) 500 MG tablet Take 1,000 mg by mouth every 6 (six) hours as needed for mild pain (pain score 1-3) or moderate pain (pain score 4-6).   amLODipine  (NORVASC ) 5 MG tablet Take 1 tablet (5 mg total) by mouth daily.   Ascorbic Acid (VITAMIN C PO) Take 1 tablet by mouth in the morning.   aspirin  EC 81 MG tablet Take 81 mg by mouth daily. Swallow whole.   busPIRone  (BUSPAR ) 10 MG tablet TAKE 2 TABLETS BY MOUTH 3 TIMES A DAY   clonazePAM  (KLONOPIN ) 0.5 MG tablet Take 1 tablet (0.5 mg total) by mouth 2 (two) times daily as needed for anxiety.   fluticasone  (FLONASE ) 50 MCG/ACT nasal spray SPRAY 1 SPRAY INTO EACH NOSTRIL EVERY DAY (Patient taking differently: Place 1 spray into both nostrils in the morning.)   levothyroxine  (SYNTHROID ) 25 MCG tablet TAKE 1 TABLET BY MOUTH EVERY DAY (Patient taking differently: Take 25 mcg by mouth in the morning.)   metoprolol  succinate (TOPROL -XL) 50 MG 24 hr tablet Take 1 tablet (50 mg total) by mouth daily. Take with or immediately following a meal.   nitroGLYCERIN  (NITROSTAT ) 0.4 MG SL tablet Place 1 tablet (0.4 mg total) under the tongue every 5 (five) minutes as needed for chest pain.   pantoprazole  (PROTONIX ) 40 MG tablet TAKE 1 TABLET BY MOUTH EVERY DAY   prasugrel  (EFFIENT ) 10 MG TABS tablet Take 1 tablet (10 mg total) by mouth daily.   promethazine  (PHENERGAN ) 12.5 MG tablet Take 1 tablet (12.5 mg total) by mouth every 6 (six) hours as needed for nausea or vomiting.   rosuvastatin  (CRESTOR ) 20 MG tablet Take 1 tablet (20 mg total) by mouth daily.   traMADol  (ULTRAM ) 50 MG tablet Take 1 tablet (50 mg total) by mouth every 6 (six) hours as needed for severe pain (pain score 7-10).    Past Medical History  Past Medical History:  Diagnosis Date   Allergic rhinitis due to allergen 12/19/2016   Anxiety    Anxiety state 09/03/2006   Arthralgia of right temporomandibular joint 08/26/2018   Bilateral deafness 01/17/2016   Cardiac murmur  03/19/2024   Chest pain of uncertain etiology 03/19/2024   Clostridium difficile infection    x several times   Deaf    uses sign language   Elevated LDL cholesterol level 04/07/2024   Family history of breast cancer in mother 08/26/2015   Family history of malignant neoplasm of gastrointestinal tract    Frequent PVCs 03/19/2024   Gallstones    GERD 07/01/2006   Qualifier: Diagnosis of   By: Charmayne MD, Arlyss         Hearing difficulty    b/l, 2/2 congenital rubella s/p stapedectomy. Using hearing aid   Hepatic cyst    6 mm on CT done done in 05/2005   Hyperglycemia 04/07/2024   Hypothyroidism    IBS 07/01/2006   Qualifier: Diagnosis of   By: Charmayne MD, Arlyss         Low back pain 10/09/2022   Migraine 12/02/2012   Obesity  Obesity (BMI 35.0-39.9 without comorbidity) 02/18/2014   On hormone replacement therapy 04/07/2024   Pain in thoracic spine 10/09/2022   Palpitations 03/05/2024   PONV (postoperative nausea and vomiting)    sometimes; depends on how long I'm under   Routine adult health maintenance 09/01/2012   MMG - Normal 2014 - she is due and I reminded her to schedule.   Pelvic - hysterectomy, no cervix.  Sees OBGYN  Flu vaccine seasonally.      DEXA - at 53yo      Screening for colon cancer 10/22/2023   Screening mammogram for breast cancer 04/07/2024   Past Surgical History Past Surgical History:  Procedure Laterality Date   ABDOMINAL ADHESION SURGERY     Enterolysis   CESAREAN SECTION  1994   CHOLECYSTECTOMY N/A 06/26/2013   Procedure: LAPAROSCOPIC CHOLECYSTECTOMY WITH ATTEMPTED INTRAOPERATIVE CHOLANGIOGRAM;  Surgeon: Vicenta DELENA Poli, MD;  Location: [facility];  Service: General;  Laterality: N/A;   COCHLEAR IMPLANT  03/30/2016   COCHLEAR IMPLANT Right 03/2015   COLONOSCOPY WITH PROPOFOL   04/23/2012   CORONARY PRESSURE/FFR WITH 3D MAPPING N/A 04/20/2024   Procedure: Coronary Pressure/FFR w/3D Mapping;  Surgeon: Mady Bruckner, MD;  Location: [facility];  Service: Cardiovascular;  Laterality: N/A;   CORONARY STENT INTERVENTION N/A 04/22/2024   Procedure: CORONARY STENT INTERVENTION;  Surgeon: Darron Deatrice DELENA, MD;  Location: [facility];  Service: Cardiovascular;  Laterality: N/A;   CORONARY ULTRASOUND/IVUS N/A 04/22/2024   Procedure: Coronary Ultrasound/IVUS;  Surgeon: Darron Deatrice DELENA, MD;  Location: [facility];  Service: Cardiovascular;  Laterality: N/A;  RCA   DIAGNOSTIC LAPAROSCOPY  01/14/2006   I've had several (07/23/2017)   ESOPHAGOGASTRODUODENOSCOPY (EGD) WITH PROPOFOL   04/23/2012   JOINT REPLACEMENT     KNEE SURGERY Right 2014   LAPAROSCOPIC OVARIAN CYSTECTOMY Right 04/16/2000   LEFT HEART CATH AND CORONARY ANGIOGRAPHY N/A 04/20/2024   Procedure: LEFT HEART CATH AND CORONARY ANGIOGRAPHY;  Surgeon: Mady Bruckner, MD;  Location: [facility];  Service: Cardiovascular;  Laterality: N/A;   SALPINGOOPHORECTOMY Left    STAPEDECTOMY Left 05/14/2006   Thyroid  Nodule removed  1997   TONSILLECTOMY     TOTAL ABDOMINAL HYSTERECTOMY  01/13/2004   Endometriosis with residual right ovary. Multiple GYN surgeries for chronic pelvic pain; had LSO in past   TUBAL LIGATION     WOUND EXPLORATION Right 10/16/2016   Procedure: RIGHT EXPLORATION OF LACERATION OF INDEX FINGER;  Surgeon: Franky Curia, MD;  Location: [facility];  Service: Orthopedics;  Laterality: Right;   Social:  Lives With: husband Occupation: [employer] operational officer Support: family  Level of Function: independent with ADLs PCP: [name], MD  Substances: -Tobacco: denies -Alcohol: denies -Recreational Drug: denies  Family History:  Family History  Problem Relation Age of Onset   Breast cancer Mother    Ovarian cancer Mother    Stroke Father    Neuropathy Father    Diabetes Father    Heart disease Father        heart attack   Irritable bowel syndrome Father    Kidney disease Father    Stomach cancer Maternal Grandmother    Colon  cancer Maternal Grandmother    Diabetes Maternal Grandmother    Bipolar disorder Daughter    Heart disease Other        both grandmothers   Esophageal cancer Neg Hx    Liver disease Neg Hx      Allergies: Allergies as  of 05/29/2024 - Review Complete 05/29/2024  Allergen Reaction Noted   Onion Anaphylaxis 11/28/2010   Amitriptyline  Anxiety, Palpitations, and Other (See Comments) 04/08/2013   Amoxicillin -pot clavulanate Nausea And Vomiting    Bacitracin Hives and Other (See Comments) 07/11/2023   Cefuroxime Nausea And Vomiting 08/06/2008   Cephalexin  Nausea And Vomiting    Ciprofloxacin  Other (See Comments)    Clarithromycin Nausea And Vomiting and Other (See Comments)    Corticosteroids Other (See Comments) 05/29/2024   Doxycycline  Hives and Nausea And Vomiting    Isosorbide  mononitrate [isosorbide  nitrate] Other (See Comments) 04/23/2024   Ondansetron  Other (See Comments) 01/20/2018   Propranolol  Other (See Comments) 01/07/2013   Sulfa antibiotics Other (See Comments) 03/04/2016   Sulfonamide derivatives Other (See Comments)    Benadryl  [diphenhydramine  hcl (sleep)] Anxiety 06/26/2013    Review of Systems: A complete ROS was negative except as per HPI.   OBJECTIVE:   Physical Exam: Blood pressure 117/78, pulse 67, temperature 98.2 F (36.8 C), temperature source Oral, resp. rate 16, height 5' (1.524 m), weight 83.4 kg, last menstrual period 12/26/2004, SpO2 100%.  Physical Exam Vitals reviewed.  Constitutional:      Appearance: She is obese. She is not ill-appearing, toxic-appearing or diaphoretic.  Cardiovascular:     Rate and Rhythm: Normal rate and regular rhythm.     Pulses:          Radial pulses are 2+ on the right side and 2+ on the left side.     Heart sounds: No murmur heard.    No friction rub. No gallop.  Pulmonary:     Effort: Pulmonary effort is normal. No tachypnea, accessory muscle usage or respiratory distress.     Breath sounds: No stridor. No  decreased breath sounds, wheezing, rhonchi or rales.  Chest:     Chest wall: No tenderness.  Abdominal:     General: Bowel sounds are normal.     Palpations: Abdomen is soft.     Tenderness: There is no abdominal tenderness. There is no guarding.  Musculoskeletal:     Right lower leg: Tenderness present. No edema.     Left lower leg: Tenderness present. No edema.  Skin:    General: Skin is warm and dry.  Neurological:     Mental Status: She is alert.  Psychiatric:        Mood and Affect: Mood is anxious.      Labs: CBC    Component Value Date/Time   WBC 8.6 05/29/2024 1356   RBC 4.52 05/29/2024 1356   HGB 13.6 05/29/2024 1408   HGB 13.8 06/30/2021 1140   HCT 40.0 05/29/2024 1408   HCT 40.1 06/30/2021 1140   PLT 270 05/29/2024 1356   PLT 296 06/30/2021 1140   MCV 90.9 05/29/2024 1356   MCV 91 06/30/2021 1140   MCH 30.3 05/29/2024 1356   MCHC 33.3 05/29/2024 1356   RDW 11.7 05/29/2024 1356   RDW 12.3 06/30/2021 1140   LYMPHSABS 3.5 05/29/2024 1356   LYMPHSABS 2.5 06/30/2021 1140   MONOABS 0.6 05/29/2024 1356   EOSABS 0.2 05/29/2024 1356   EOSABS 0.2 06/30/2021 1140   BASOSABS 0.0 05/29/2024 1356   BASOSABS 0.0 06/30/2021 1140     CMP     Component Value Date/Time   NA 135 05/29/2024 1408   NA 143 05/31/2022 0954   K 4.4 05/29/2024 1408   CL 103 05/29/2024 1408   CO2 21 (L) 05/29/2024 1356   GLUCOSE 91 05/29/2024 1408  BUN 17 05/29/2024 1408   BUN 11 05/31/2022 0954   CREATININE 0.70 05/29/2024 1408   CREATININE 0.68 02/17/2014 0916   CALCIUM  9.7 05/29/2024 1356   PROT 7.3 05/29/2024 1356   PROT 7.2 05/31/2022 0954   ALBUMIN 4.2 05/29/2024 1356   ALBUMIN 4.5 05/31/2022 0954   AST 21 05/29/2024 1356   ALT 15 05/29/2024 1356   ALKPHOS 42 05/29/2024 1356   BILITOT 0.6 05/29/2024 1356   BILITOT <0.2 05/31/2022 0954   GFRNONAA >60 05/29/2024 1356   GFRNONAA >89 02/17/2014 0916   GFRAA >60 06/14/2020 1152   GFRAA >89 02/17/2014 0916    Imaging:  CT  Angio Chest PE W and/or Wo Contrast Result Date: 05/29/2024 CLINICAL DATA:  Left chest pressure and arm pain EXAM: CT ANGIOGRAPHY CHEST WITH CONTRAST TECHNIQUE: Multidetector CT imaging of the chest was performed using the standard protocol during bolus administration of intravenous contrast. Multiplanar CT image reconstructions and MIPs were obtained to evaluate the vascular anatomy. RADIATION DOSE REDUCTION: This exam was performed according to the departmental dose-optimization program which includes automated exposure control, adjustment of the mA and/or kV according to patient size and/or use of iterative reconstruction technique. CONTRAST:  75mL OMNIPAQUE  IOHEXOL  350 MG/ML SOLN COMPARISON:  Chest x-ray 05/29/2024, chest CT 03/03/2024 FINDINGS: Cardiovascular: Satisfactory opacification of the pulmonary arteries to the segmental level. No evidence of pulmonary embolism. Nonaneurysmal aorta. No dissection. Multi vessel coronary vascular calcification. Borderline cardiomegaly. No significant pericardial effusion Mediastinum/Nodes: Patent trachea. No thyroid  mass. No suspicious lymph nodes. Esophagus within normal limits. Lungs/Pleura: No acute airspace disease, pleural effusion, or pneumothorax Upper Abdomen: No acute finding Musculoskeletal: No acute osseous abnormality Review of the MIP images confirms the above findings. IMPRESSION: 1. Negative for acute pulmonary embolus or aortic dissection. 2. Borderline to mild cardiomegaly with multi vessel coronary vascular calcification. Electronically Signed   By: Luke Bun M.D.   On: 05/29/2024 16:50   DG Chest 2 View Result Date: 05/29/2024 CLINICAL DATA:  Chest pain. EXAM: CHEST - 2 VIEW COMPARISON:  04/17/2024. FINDINGS: The heart size and mediastinal contours are within unchanged. No focal consolidation, pleural effusion, or pneumothorax. Levocurvature of the thoracic spine. No acute osseous abnormality. IMPRESSION: No acute cardiopulmonary findings.  Electronically Signed   By: Harrietta Sherry M.D.   On: 05/29/2024 15:06     EKG: personally reviewed my interpretation is normal sinus rhythm, no ST elevation or T wave inversion. Prior EKG sinus bradycardia without ST elevation or T wave inversion May 13, 2024  ASSESSMENT & PLAN:   Assessment & Plan by Problem: Principal Problem:   Chest pain Active Problems:   Hypothyroidism   Anxiety state   GERD   Bilateral deafness   Palpitations   CAD (coronary artery disease)   Primary hypertension   Lanasia P Holaway is a 53 y.o. 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  Patient did have residual 55% stenosis in the LAD being managed medically.  The past 24 hours patient is complaining of chest pain similar to episode prior to hospitalization and stenting with nausea, palpitations, pounding, pressure, radiation to back, tingling in left arm unresolved with nitroglycerin .  Cardiac workup has been negative  for ACS and PE has been ruled out.  EKG sinus rhythm with no ST elevations, chest x-ray unremarkable, CTA unremarkable, troponins unremarkable.  Nitroglycerin  ordered but patient does not appear to get relief and causes headaches. Patient was given the option to work up chest pain outpatient by cardiology given that ACS is excluded but patient requested to be admitted to get the workup completed. - Cardiology consulted, Heart catheterization planned for Monday - Continuous cardiac monitoring - Echo ordered by cardiology  -Tramadol  50 mg every 6 as needed for severe pain, Tylenol  1000 mg Q6 as needed for mild pain -Phenergan   every 6 hours as needed for nausea - Continuing baseline medical therapy including DAPT with ASA and prasugrel , metoprolol , and amlodipine  per cardiology - Klonopin  0.5 mg daily at bedtime as needed for anxiety and BuSpar  20 mg 3 times daily started per home med - Rosuvastatin  20 mg started per home med  Fatigue Muscle Aches Hypothyroidism Patient secondary complaint outside of chest pain has been worsening fatigue for the past month since leaving hospital post stent placement.  She describes that her day-to-day is eating and sleeping.  It is interfering with her quality of life as she is unable to drive with her cardiac history and unable to spend time with her husband and grandson due to having to sleep all day.  Patient concerned that it could be due to stopping estradiol  due to contraindication with CAD; alternatives have been discussed with PCP and note from 05/19/2024 stated she was agreeable to see a gynecologist to discuss potential alternatives for menopausal symptoms. Explained to patient that symptoms could be related to starting metoprolol  XL 50 mg as fatigue can be a side effect with the lowering of heart rate.  Patient has listed medication allergy to propranolol  with effect of fatigue and making her feel crazy. Has history of hypothyroidism on levothyroxine  25 mcg.  Last TSH checked 03/05/2024 and was 2.370 WNL.  Patient has been complaining of bilateral lower extremity muscle aches over the past 24 hours.  Takes rosuvastatin  20 mg daily. -TSH ordered  - Continue metoprolol  XL 50 mg and rosuvastatin  20 mg during hospitalization. - Consider alteration to beta-blocker/ statin therapy outpatient - Consider further workup outpatient for fatigue  GERD - pantoprazole  40 mg started per home med  Best practice: Diet: heart Healthy VTE: Heparin  IVF: none,None Code: Full  Disposition planning: Prior to Admission Living Arrangement: Home, living with husband Anticipated Discharge  Location: Home  Dispo: Admit patient to Inpatient with expected length of stay greater than 2 midnights.  Signed: Benuel Braun, DO Internal Medicine Resident  05/29/2024, 9:37 PM  On Call pager: [phone]

## 2024-05-29 NOTE — Consult Note (Signed)
 Cardiology Consultation   Patient ID: Donna Davis MRN: [id number]; DOB: 1971-03-11  Admit date: 05/29/2024 Date of Consult: 05/29/2024  PCP:  [name], MD   [facility] Providers Cardiologist:  Ozell Fell, MD      Patient Profile: Donna Davis is a 53 y.o. female with a hx of deafness, GERD, migraine, palpitations, SVT, syncope, recurrent chest pain, three-vessel CAD s/p DES to the RCA and LCx, prior tobacco abuse, carpal tunnel syndrome, hypothyroidism, GERD, and anxiety who is being seen 05/29/2024 for the evaluation of chest pain at the request of Ludivina Shines MD.  History of Present Illness: Donna Davis is a 53 year old female with prior cardiac history listed below.  On 03/2024 patient wore a 14-day monitor that showed 9 runs of SVT the longest lasting 15 beats.  The study was felt to be unremarkable.  On 04/17/2024 the patient had a stress echo.  During this test the patient developed chest pain, nausea, and fatigue.  The patient had ST depression in V2 V3 and aVF.  The echo showed a baseline LVEF of 65% and stress LVEF of 70%. because of the ongoing chest pain the patient was transferred to the emergency department.  The patient's workup in the emergency department showed normal troponins and nonischemic EKG. because of this the patient was sent home and scheduled for an outpatient cardiac catheterization.  Cardiac catheterization on 04/20/2024 showed 50 to 60% stenosis in the mid LAD, 90% stenosis in the distal LCx, and 60 to 80% stenosis in the proximal RCA.  Most of these lesions were hemodynamically significant by FFR.  Because of this the patient was referred to CT surgery for CABG evaluation.  Ultimately the decision was made to proceed with staged PCI.  On 04/22/2024 the patient received PCI to the RCA and Lcx.  Patient was recommended to remain on aspirin  and Effient  for 6 months.  Patient was seen in the office on 05/13/2024.  At that visit she reported complete  resolution of the chest pain for about 1 week.  After that the patient had a constant pressure that was radiating to her left arm.  The pain was not associated with exertion.  Nitroglycerin  helped the pain but did cause her to have a headache.  Feels like the pain is similar to her prior chest pain before the recent catheterization.  EKG showed no acute ischemic changes.  Troponin was ordered and was normal.  She was offered to go to the Cath Lab but declined.  Patient presenting to the emergency department for worsening chest and arm pain.  Patient's husband and an interpreter were present during the visit.  On interview patient reported  that she had a chest pressure that radiates to her left arm multiple times throughout the night.  This chest discomfort was relieved by nitroglycerin .  She also had shortness of breath, palpitations, diaphoresis, and nausea.  She reported that this chest discomfort feels similar to the discomfort she had prior to her past catheterization.  Reported that her systolic blood pressure was in the 130s to 150s and heart rate was in the 90s.  Has had fatigue since June and has not been able to exercise as much as she typically does.  Denies that chest discomfort is worse with exertion. The patient also reported that she had a hysterectomy done in her 30s and has been on estradiol  since.  Reported that her provider who manages this recently called and informed her to stop her HRT.  Suspect that some of these symptoms may be related to stopping her HRT.  Labs showed potassium of 4.4, creatinine of 0.70, hemoglobin of 13.7, negative high-sensitivity troponins, and normal LFTs.  EKG showed normal sinus rhythm with a rate of 70 and poor R wave progression.  Chest x-ray showed no acute cardiopulmonary findings.  Past Medical History:  Diagnosis Date   Allergic rhinitis due to allergen 12/19/2016   Anxiety    Anxiety state 09/03/2006   Arthralgia of right temporomandibular  joint 08/26/2018   Bilateral deafness 01/17/2016   Cardiac murmur 03/19/2024   Chest pain of uncertain etiology 03/19/2024   Clostridium difficile infection    x several times   Deaf    uses sign language   Elevated LDL cholesterol level 04/07/2024   Family history of breast cancer in mother 08/26/2015   Family history of malignant neoplasm of gastrointestinal tract    Frequent PVCs 03/19/2024   Gallstones    GERD 07/01/2006   Qualifier: Diagnosis of   By: Charmayne MD, Arlyss         Hearing difficulty    b/l, 2/2 congenital rubella s/p stapedectomy. Using hearing aid   Hepatic cyst    6 mm on CT done done in 05/2005   Hyperglycemia 04/07/2024   Hypothyroidism    IBS 07/01/2006   Qualifier: Diagnosis of   By: Charmayne MD, Arlyss         Low back pain 10/09/2022   Migraine 12/02/2012   Obesity    Obesity (BMI 35.0-39.9 without comorbidity) 02/18/2014   On hormone replacement therapy 04/07/2024   Pain in thoracic spine 10/09/2022   Palpitations 03/05/2024   PONV (postoperative nausea and vomiting)    sometimes; depends on how long I'm under   Routine adult health maintenance 09/01/2012   MMG - Normal 2014 - she is due and I reminded her to schedule.   Pelvic - hysterectomy, no cervix.  Sees OBGYN  Flu vaccine seasonally.      DEXA - at 53yo      Screening for colon cancer 10/22/2023   Screening mammogram for breast cancer 04/07/2024    Past Surgical History:  Procedure Laterality Date   ABDOMINAL ADHESION SURGERY     Enterolysis   CESAREAN SECTION  1994   CHOLECYSTECTOMY N/A 06/26/2013   Procedure: LAPAROSCOPIC CHOLECYSTECTOMY WITH ATTEMPTED INTRAOPERATIVE CHOLANGIOGRAM;  Surgeon: Vicenta DELENA Poli, MD;  Location: [facility];  Service: General;  Laterality: N/A;   COCHLEAR IMPLANT  03/30/2016   COCHLEAR IMPLANT Right 03/2015   COLONOSCOPY WITH PROPOFOL   04/23/2012   CORONARY PRESSURE/FFR WITH 3D MAPPING N/A 04/20/2024   Procedure: Coronary Pressure/FFR w/3D Mapping;  Surgeon:  Mady Bruckner, MD;  Location: [facility];  Service: Cardiovascular;  Laterality: N/A;   CORONARY STENT INTERVENTION N/A 04/22/2024   Procedure: CORONARY STENT INTERVENTION;  Surgeon: Darron Deatrice DELENA, MD;  Location: [facility];  Service: Cardiovascular;  Laterality: N/A;   CORONARY ULTRASOUND/IVUS N/A 04/22/2024   Procedure: Coronary Ultrasound/IVUS;  Surgeon: Darron Deatrice DELENA, MD;  Location: [facility];  Service: Cardiovascular;  Laterality: N/A;  RCA   DIAGNOSTIC LAPAROSCOPY  01/14/2006   I've had several (07/23/2017)   ESOPHAGOGASTRODUODENOSCOPY (EGD) WITH PROPOFOL   04/23/2012   JOINT REPLACEMENT     KNEE SURGERY Right 2014   LAPAROSCOPIC OVARIAN CYSTECTOMY Right 04/16/2000   LEFT HEART CATH AND CORONARY ANGIOGRAPHY N/A 04/20/2024   Procedure: LEFT HEART CATH AND CORONARY ANGIOGRAPHY;  Surgeon: Mady Bruckner, MD;  Location: [facility];  Service: Cardiovascular;  Laterality: N/A;   SALPINGOOPHORECTOMY Left    STAPEDECTOMY Left 05/14/2006   Thyroid  Nodule removed  1997   TONSILLECTOMY     TOTAL ABDOMINAL HYSTERECTOMY  01/13/2004   Endometriosis with residual right ovary. Multiple GYN surgeries for chronic pelvic pain; had LSO in past   TUBAL LIGATION     WOUND EXPLORATION Right 10/16/2016   Procedure: RIGHT EXPLORATION OF LACERATION OF INDEX FINGER;  Surgeon: Franky Curia, MD;  Location: [facility];  Service: Orthopedics;  Laterality: Right;     Home Medications:  Prior to Admission medications   Medication Sig Start Date End Date Taking? Authorizing Provider  acetaminophen  (TYLENOL ) 500 MG tablet Take 1,000 mg by mouth every 6 (six) hours as needed for mild pain (pain score 1-3) or moderate pain (pain score 4-6).    [provider]  amLODipine  (NORVASC ) 5 MG tablet Take 1 tablet (5 mg total) by mouth daily. 05/13/24 08/11/24  Sheron Lorette GRADE, PA-C  Ascorbic Acid (VITAMIN C PO) Take 1 tablet by mouth in the morning.     [provider]  aspirin  EC 81 MG tablet Take 81 mg by mouth daily. Swallow whole.    [provider]  busPIRone  (BUSPAR ) 10 MG tablet TAKE 2 TABLETS BY MOUTH 3 TIMES A DAY 02/24/24   [name], MD  clonazePAM  (KLONOPIN ) 0.5 MG tablet Take 1 tablet (0.5 mg total) by mouth 2 (two) times daily as needed for anxiety. 03/04/24   Corey, Evan S, MD  dicyclomine  (BENTYL ) 10 MG capsule Take 1 capsule (10 mg total) by mouth daily as needed for spasms. Patient not taking: Reported on 05/13/2024 05/04/24   Stacia Glendia BRAVO, MD  Docusate Calcium  (STOOL SOFTENER PO) Take 1 tablet by mouth 2 (two) times daily as needed. Patient not taking: Reported on 05/13/2024    [provider]  fluticasone  (FLONASE ) 50 MCG/ACT nasal spray SPRAY 1 SPRAY INTO EACH NOSTRIL EVERY DAY Patient taking differently: Place 1 spray into both nostrils in the morning. 04/16/24   [name], MD  levothyroxine  (SYNTHROID ) 25 MCG tablet TAKE 1 TABLET BY MOUTH EVERY DAY Patient taking differently: Take 25 mcg by mouth in the morning. 08/26/23   [name], MD  metoprolol  succinate (TOPROL -XL) 50 MG 24 hr tablet Take 1 tablet (50 mg total) by mouth daily. Take with or immediately following a meal. 04/23/24   Zhao, Xika, NP  nitroGLYCERIN  (NITROSTAT ) 0.4 MG SL tablet Place 1 tablet (0.4 mg total) under the tongue every 5 (five) minutes as needed for chest pain. 04/23/24   Zhao, Xika, NP  pantoprazole  (PROTONIX ) 40 MG tablet TAKE 1 TABLET BY MOUTH EVERY DAY 02/24/24   [name], MD  prasugrel  (EFFIENT ) 10 MG TABS tablet Take 1 tablet (10 mg total) by mouth daily. 04/23/24   Zhao, Xika, NP  promethazine  (PHENERGAN ) 12.5 MG tablet Take 1 tablet (12.5 mg total) by mouth every 6 (six) hours as needed for nausea or vomiting. 04/23/24   [name], MD  rosuvastatin  (CRESTOR ) 20 MG tablet Take 1 tablet (20 mg total) by mouth daily. 04/23/24   Zhao, Xika, NP  traMADol  (ULTRAM ) 50 MG tablet Take 1 tablet (50 mg total) by mouth every 6 (six)  hours as needed for severe pain (pain score 7-10). 04/23/24   [name], MD    Scheduled Meds:  aspirin        Continuous Infusions:  PRN Meds: aspirin , nitroGLYCERIN   Allergies:  Allergies  Allergen Reactions   Onion Anaphylaxis   Other Other (See Comments)    Ossmo Prep - drop in BP Steroids Went crazy   Shellfish Allergy Anaphylaxis and Swelling    *pt denies    Amitriptyline  Anxiety, Palpitations and Other (See Comments)    hallucinations   Amoxicillin -Pot Clavulanate Nausea And Vomiting    With high dose, low dose ok   Amoxicillin -Pot Clavulanate Nausea And Vomiting    With high dose, low dose ok   Cefuroxime Nausea And Vomiting   Cephalexin  Other (See Comments)     gi upset  gi upset   Ciprofloxacin  Other (See Comments)     Fevers   Clarithromycin Nausea And Vomiting and Other (See Comments)    Pt is OK with azithromycin   gi upset   Clarithromycin Nausea And Vomiting    Pt is OK with azithromycin    Doxycycline  Other (See Comments) and Hives     gi upset  gi upset   Isosorbide  Mononitrate [Isosorbide  Nitrate] Other (See Comments)    Severe migraine from this- pt refuses to take it anymore   Propranolol  Other (See Comments)    Fatigue and makes her feel crazy, refuses to take.   Sulfa Antibiotics Other (See Comments)    Rash, vaginal blisters   Sulfasalazine Itching    Rash, vaginal blisters Rash, vaginal blisters    Sulfonamide Derivatives Other (See Comments)    vaginal blisters   Bacitracin Hives    Redness, pain at site of application   Isosorbide      Migraine    Ondansetron  Other (See Comments)    CAUSES SEVERE MIGRAINES  Other reaction(s): Headache  CAUSES SEVERE MIGRAINES  CAUSES SEVERE MIGRAINES   Benadryl  [Diphenhydramine  Hcl (Sleep)] Anxiety    Extreme agitation   Shrimp Flavor Agent (Non-Screening) Other (See Comments)    unknown    Social History:   Social History   Socioeconomic History   Marital status: Married    Spouse  name: Not on file   Number of children: 2   Years of education: Not on file   Highest education level: Not on file  Occupational History   Occupation: Immunologist    Comment: works in [place]   Tobacco Use   Smoking status: Former    Current packs/day: 0.00    Average packs/day: 1 pack/day for 26.0 years (26.0 ttl pk-yrs)    Types: Cigarettes    Start date: 02/17/1981    Quit date: 02/18/2007    Years since quitting: 17.2   Smokeless tobacco: Never   Tobacco comments:    quit 2008  Vaping Use   Vaping status: Never Used  Substance and Sexual Activity   Alcohol use: Yes    Comment: Very Rarely   Drug use: No   Sexual activity: Not Currently    Partners: Male    Birth control/protection: Surgical  Other Topics Concern   Not on file  Social History Narrative   Current smoker, not using alcohol or drugs at this timeLives with husband and 2 children      Are you right handed or left handed? Right Handed    Are you currently employed ? 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 No    Who lives with you? Husband and 85 pound dog    What type of home do you live in: 1 story or 2 story? Lives in a one story home  Social Drivers of Corporate investment banker Strain: Not on file  Food Insecurity: No Food Insecurity (04/20/2024)   Hunger Vital Sign    Worried About Running Out of Food in the Last Year: Never true    Ran Out of Food in the Last Year: Never true  Transportation Needs: No Transportation Needs (04/20/2024)   PRAPARE - Administrator, Civil Service (Medical): No    Lack of Transportation (Non-Medical): No  Physical Activity: Not on file  Stress: Not on file  Social Connections: Not on file  Intimate Partner Violence: Not At Risk (04/20/2024)   Humiliation, Afraid, Rape, and Kick questionnaire    Fear of Current or Ex-Partner: No    Emotionally Abused: No    Physically Abused: No    Sexually  Abused: No    Family History:    Family History  Problem Relation Age of Onset   Breast cancer Mother    Ovarian cancer Mother    Stroke Father    Neuropathy Father    Diabetes Father    Heart disease Father        heart attack   Irritable bowel syndrome Father    Kidney disease Father    Stomach cancer Maternal Grandmother    Colon cancer Maternal Grandmother    Diabetes Maternal Grandmother    Bipolar disorder Daughter    Heart disease Other        both grandmothers   Esophageal cancer Neg Hx    Liver disease Neg Hx      ROS:  Please see the history of present illness.   All other ROS reviewed and negative.     Physical Exam/Data: Vitals:   05/29/24 1430 05/29/24 1500 05/29/24 1515 05/29/24 1530  BP: 114/89 (!) 112/52 101/70 (!) 118/106  Pulse: 66 63 64 66  Resp: 11 14 (!) 7 (!) 33  Temp:      SpO2: 100% 100% 100% 100%  Weight:      Height:       No intake or output data in the 24 hours ending 05/29/24 1557    05/29/2024    1:50 PM 05/13/2024    9:00 AM 05/01/2024    5:59 PM  Last 3 Weights  Weight (lbs) 183 lb 13.8 oz 183 lb 12.8 oz 185 lb 6.5 oz  Weight (kg) 83.4 kg 83.371 kg 84.1 kg     Body mass index is 35.91 kg/m.  General:  Well nourished, well developed, in no acute distress HEENT: normal Neck: no JVD Vascular: No carotid bruits; Distal pulses 2+ bilaterally Cardiac:  normal S1, S2; RRR; no murmur.  Chest was tender to palpation denied that it was similar to the chest pressure that brought her in. Lungs:  clear to auscultation bilaterally, no wheezing, rhonchi or rales  Abd: soft, nontender, no hepatomegaly  Ext: no edema Musculoskeletal:  No deformities Skin: warm and dry  Neuro:  no focal abnormalities noted Psych:  Normal affect   EKG:  The EKG was personally reviewed and demonstrates: normal sinus rhythm with a rate of 70 and poor R wave progression. Telemetry:  Telemetry was personally reviewed and demonstrates: The telemetry for the  patient's room was listed under another patient's name so was not evaluated  Relevant CV Studies: Echo pending  Laboratory Data: High Sensitivity Troponin:   Recent Labs  Lab 05/29/24 1356  TROPONINIHS 3     Chemistry Recent Labs  Lab 05/29/24 1356 05/29/24 1408  NA  135 135  K 4.5 4.4  CL 102 103  CO2 21*  --   GLUCOSE 90 91  BUN 15 17  CREATININE 0.73 0.70  CALCIUM  9.7  --   GFRNONAA >60  --   ANIONGAP 12  --     Recent Labs  Lab 05/29/24 1356  PROT 7.3  ALBUMIN 4.2  AST 21  ALT 15  ALKPHOS 42  BILITOT 0.6   Lipids No results for input(s): CHOL, TRIG, HDL, LABVLDL, LDLCALC, CHOLHDL in the last 168 hours.  Hematology Recent Labs  Lab 05/29/24 1356 05/29/24 1408  WBC 8.6  --   RBC 4.52  --   HGB 13.7 13.6  HCT 41.1 40.0  MCV 90.9  --   MCH 30.3  --   MCHC 33.3  --   RDW 11.7  --   PLT 270  --    Thyroid  No results for input(s): TSH, FREET4 in the last 168 hours.  BNPNo results for input(s): BNP, PROBNP in the last 168 hours.  DDimer No results for input(s): DDIMER in the last 168 hours.  Radiology/Studies:  DG Chest 2 View Result Date: 05/29/2024 CLINICAL DATA:  Chest pain. EXAM: CHEST - 2 VIEW COMPARISON:  04/17/2024. FINDINGS: The heart size and mediastinal contours are within unchanged. No focal consolidation, pleural effusion, or pneumothorax. Levocurvature of the thoracic spine. No acute osseous abnormality. IMPRESSION: No acute cardiopulmonary findings. Electronically Signed   By: Harrietta Sherry M.D.   On: 05/29/2024 15:06     Assessment and Plan: Cleotilde P Ogburn is a 53 y.o. female with a hx of deafness, GERD, migraine, palpitations, SVT, syncope, recurrent chest pain, three-vessel CAD s/p DES to the RCA and LCx, and anxiety who is being seen 05/29/2024 for the evaluation of chest pain at the request of Ludivina Shines MD.  Chest pain Three-vessel CAD s/p DES to the RCA and Lcx Cardiac catheterization on 04/20/2024 showed 50  to 60% stenosis in the mid LAD, 90% stenosis in the distal LCx, and 60 to 80% stenosis in the proximal RCA.  Most of these lesions were hemodynamically significant by FFR.  Because of this the patient later received PCI to the RCA and Lcx.  Patient was recommended to remain on aspirin  and Effient  for 6 months. Patient presented to the emergency department for chest pressure that radiated to her left arm.  Reported that pain was ongoing.  Associated symptoms include palpitations, diaphoresis, and nausea.  She reported that this chest discomfort feels similar to the discomfort she had prior to her past catheterization. Had chest pain at office visit on 05/13/2024.  EKG showed no acute ischemic changes High-sensitivity troponins were negative. Creatinine was 0.70. Patient received 243 mg of chewable aspirin  Continue aspirin  81 mg daily Continue Effient  Continue rosuvastatin  20 mg Start IV nitroglycerin  Plan on cardiac catheterization on Monday.  Informed Consent   Shared Decision Making/Informed Consent The risks [stroke (1 in 1000), death (1 in 1000), kidney failure [usually temporary] (1 in 500), bleeding (1 in 200), allergic reaction [possibly serious] (1 in 200)], benefits (diagnostic support and management of coronary artery disease) and alternatives of a cardiac catheterization were discussed in detail with Donna Davis and she is willing to proceed.      Hysterectomy with prior HRT use Anxiety The patient also reported that she had a hysterectomy done in her 30s and has been on estradiol  since.  Reported that her provider who manages this recently called and informed her to stop her  HRT.  Suspect that some of these symptoms such as diaphoresis, chills may be related to stopping her HRT.   Patient also reportedly has hypothyroidism Ordered TSH     Risk Assessment/Risk Scores:            For questions or updates, please contact [facility] Please consult [email or web address] for  contact info under     Signed, Morse Clause, PA-C  05/29/2024 3:57 PM

## 2024-05-29 NOTE — ED Provider Notes (Signed)
 [place] EMERGENCY DEPARTMENT AT [facility] Provider Note   CSN: [id number] Arrival date & time: 05/29/24  1250     Patient presents with: Chest Pain   Donna Davis is a 53 y.o. female here for evaluation of chest pain.  She is currently followed by cardiology for similar.  Recent heart cath with 2 stents placed however did have residual 50% stenosis in 1 area which is being treated medically.  Yesterday developed worsening chest pain to her left chest.  Radiated to her left arm and neck.  Some associated nausea.  Occasional have pain behind her left breast.  Reports she will feel a fluttering in her chest.  When she was seen by cardiology at the end of August she was recommended ED evaluation for possible admission however declined apparently at the time according to note.  Today she was at her grandson's presentation for grandparents today when she had worsening pain.  She took 2 nitroglycerin  since pain began which helped her pain however she still has persistent pain.  Describes it as a pressure.  No prior history of PE or DVT.  Does have occasional shortness of breath.  States chest pain and shortness of breath and DOE limit her physical activity.  Her pain initially improved after her heart catheterization however she states it returned.  No known trauma or injury.  No fever, cough, abdominal pain, vomiting, unilateral leg swelling, PND orthopnea.  She is compliant with her home medications she does note that she has been off of her hormones taken off by her PCP over the last week.   ASL interpretor was used   HPI     Prior to Admission medications   Medication Sig Start Date End Date Taking? Authorizing Provider  acetaminophen  (TYLENOL ) 500 MG tablet Take 1,000 mg by mouth every 6 (six) hours as needed for mild pain (pain score 1-3) or moderate pain (pain score 4-6).   Yes [provider]  amLODipine  (NORVASC ) 5 MG tablet Take 1 tablet (5 mg total) by mouth daily.  05/13/24 08/11/24 Yes Dunlap, Lorette GRADE, PA-C  Ascorbic Acid (VITAMIN C PO) Take 1 tablet by mouth in the morning.   Yes [provider]  aspirin  EC 81 MG tablet Take 81 mg by mouth daily. Swallow whole.   Yes [provider]  busPIRone  (BUSPAR ) 10 MG tablet TAKE 2 TABLETS BY MOUTH 3 TIMES A DAY 02/24/24  Yes [name], MD  clonazePAM  (KLONOPIN ) 0.5 MG tablet Take 1 tablet (0.5 mg total) by mouth 2 (two) times daily as needed for anxiety. 03/04/24  Yes Corey, Evan S, MD  fluticasone  (FLONASE ) 50 MCG/ACT nasal spray SPRAY 1 SPRAY INTO EACH NOSTRIL EVERY DAY Patient taking differently: Place 1 spray into both nostrils in the morning. 04/16/24  Yes [name], MD  levothyroxine  (SYNTHROID ) 25 MCG tablet TAKE 1 TABLET BY MOUTH EVERY DAY Patient taking differently: Take 25 mcg by mouth in the morning. 08/26/23  Yes [name], MD  metoprolol  succinate (TOPROL -XL) 50 MG 24 hr tablet Take 1 tablet (50 mg total) by mouth daily. Take with or immediately following a meal. 04/23/24  Yes Zhao, Xika, NP  nitroGLYCERIN  (NITROSTAT ) 0.4 MG SL tablet Place 1 tablet (0.4 mg total) under the tongue every 5 (five) minutes as needed for chest pain. 04/23/24  Yes Zhao, Xika, NP  pantoprazole  (PROTONIX ) 40 MG tablet TAKE 1 TABLET BY MOUTH EVERY DAY 02/24/24  Yes [name], MD  prasugrel  (EFFIENT ) 10 MG  TABS tablet Take 1 tablet (10 mg total) by mouth daily. 04/23/24  Yes Zhao, Xika, NP  promethazine  (PHENERGAN ) 12.5 MG tablet Take 1 tablet (12.5 mg total) by mouth every 6 (six) hours as needed for nausea or vomiting. 04/23/24  Yes [name], MD  rosuvastatin  (CRESTOR ) 20 MG tablet Take 1 tablet (20 mg total) by mouth daily. 04/23/24  Yes Zhao, Xika, NP  traMADol  (ULTRAM ) 50 MG tablet Take 1 tablet (50 mg total) by mouth every 6 (six) hours as needed for severe pain (pain score 7-10). 04/23/24  Yes [name], MD  dicyclomine  (BENTYL ) 10 MG capsule Take 1 capsule (10 mg total) by mouth daily as needed for spasms. Patient  not taking: No sig reported 05/04/24   Stacia Glendia BRAVO, MD  Docusate Calcium  (STOOL SOFTENER PO) Take 1 tablet by mouth 2 (two) times daily as needed. Patient not taking: No sig reported    [provider]    Allergies: Onion, Amitriptyline , Amoxicillin -pot clavulanate, Bacitracin, Cefuroxime, Cephalexin , Ciprofloxacin , Clarithromycin, Corticosteroids, Doxycycline , Isosorbide  mononitrate [isosorbide  nitrate], Ondansetron , Propranolol , Sulfa antibiotics, Sulfonamide derivatives, and Benadryl  [diphenhydramine  hcl (sleep)]    Review of Systems  Constitutional: Negative.   HENT: Negative.    Respiratory:  Positive for shortness of breath.   Cardiovascular:  Positive for chest pain and palpitations.  Gastrointestinal: Negative.   Genitourinary: Negative.   Musculoskeletal: Negative.   Skin: Negative.   Neurological: Negative.   All other systems reviewed and are negative.   Updated Vital Signs BP 122/82   Pulse 67   Temp 98.2 F (36.8 C) (Oral)   Resp 15   Ht 5' (1.524 m)   Wt 83.4 kg   LMP 12/26/2004   SpO2 100%   BMI 35.91 kg/m   Physical Exam Vitals and nursing note reviewed.  Constitutional:      General: She is not in acute distress.    Appearance: She is well-developed. She is not ill-appearing, toxic-appearing or diaphoretic.  HENT:     Head: Atraumatic.  Eyes:     Pupils: Pupils are equal, round, and reactive to light.  Cardiovascular:     Rate and Rhythm: Normal rate.     Pulses:          Radial pulses are 2+ on the right side and 2+ on the left side.       Dorsalis pedis pulses are 2+ on the right side and 2+ on the left side.     Heart sounds: Normal heart sounds.  Pulmonary:     Effort: Pulmonary effort is normal. No respiratory distress.     Breath sounds: Normal breath sounds.     Comments: Clear bilaterally, speaks in full sentences without difficulty Chest:     Comments: Nontender chest wall, no crepitus or step-off Abdominal:     General:  Bowel sounds are normal. There is no distension.     Palpations: Abdomen is soft.     Comments: Soft, nontender, no rebound or guarding  Musculoskeletal:        General: Normal range of motion.     Cervical back: Normal range of motion.     Right lower leg: No tenderness. No edema.     Left lower leg: No tenderness. No edema.     Comments: No bony tenderness, compartments soft, full range of motion  Skin:    General: Skin is warm and dry.     Capillary Refill: Capillary refill takes less than 2 seconds.  Neurological:  General: No focal deficit present.     Mental Status: She is alert.     Cranial Nerves: No cranial nerve deficit.     Motor: No weakness.  Psychiatric:        Mood and Affect: Mood normal.     (all labs ordered are listed, but only abnormal results are displayed) Labs Reviewed  COMPREHENSIVE METABOLIC PANEL WITH GFR - Abnormal; Notable for the following components:      Result Value   CO2 21 (*)    All other components within normal limits  I-STAT CHEM 8, ED - Abnormal; Notable for the following components:   Calcium , Ion 1.09 (*)    TCO2 21 (*)    All other components within normal limits  CBC WITH DIFFERENTIAL/PLATELET  TSH  HIV ANTIBODY (ROUTINE TESTING W REFLEX)  BASIC METABOLIC PANEL WITH GFR  CBC  TROPONIN I (HIGH SENSITIVITY)  TROPONIN I (HIGH SENSITIVITY)    EKG: EKG Interpretation Date/Time:  Friday May 29 2024 13:35:59 EDT Ventricular Rate:  70 PR Interval:  176 QRS Duration:  90 QT Interval:  414 QTC Calculation: 447 R Axis:   4  Text Interpretation: Normal sinus rhythm When compared with ECG of 13-May-2024 08:52, PREVIOUS ECG IS PRESENT no sig change Confirmed by Armenta Canning [phone]) on 05/29/2024 3:01:32 PM  Radiology: CT Angio Chest PE W and/or Wo Contrast Result Date: 05/29/2024 CLINICAL DATA:  Left chest pressure and arm pain EXAM: CT ANGIOGRAPHY CHEST WITH CONTRAST TECHNIQUE: Multidetector CT imaging of the chest was  performed using the standard protocol during bolus administration of intravenous contrast. Multiplanar CT image reconstructions and MIPs were obtained to evaluate the vascular anatomy. RADIATION DOSE REDUCTION: This exam was performed according to the departmental dose-optimization program which includes automated exposure control, adjustment of the mA and/or kV according to patient size and/or use of iterative reconstruction technique. CONTRAST:  75mL OMNIPAQUE  IOHEXOL  350 MG/ML SOLN COMPARISON:  Chest x-ray 05/29/2024, chest CT 03/03/2024 FINDINGS: Cardiovascular: Satisfactory opacification of the pulmonary arteries to the segmental level. No evidence of pulmonary embolism. Nonaneurysmal aorta. No dissection. Multi vessel coronary vascular calcification. Borderline cardiomegaly. No significant pericardial effusion Mediastinum/Nodes: Patent trachea. No thyroid  mass. No suspicious lymph nodes. Esophagus within normal limits. Lungs/Pleura: No acute airspace disease, pleural effusion, or pneumothorax Upper Abdomen: No acute finding Musculoskeletal: No acute osseous abnormality Review of the MIP images confirms the above findings. IMPRESSION: 1. Negative for acute pulmonary embolus or aortic dissection. 2. Borderline to mild cardiomegaly with multi vessel coronary vascular calcification. Electronically Signed   By: Luke Bun M.D.   On: 05/29/2024 16:50   DG Chest 2 View Result Date: 05/29/2024 CLINICAL DATA:  Chest pain. EXAM: CHEST - 2 VIEW COMPARISON:  04/17/2024. FINDINGS: The heart size and mediastinal contours are within unchanged. No focal consolidation, pleural effusion, or pneumothorax. Levocurvature of the thoracic spine. No acute osseous abnormality. IMPRESSION: No acute cardiopulmonary findings. Electronically Signed   By: Harrietta Sherry M.D.   On: 05/29/2024 15:06     .Critical Care  Performed by: Edie Rosebud LABOR, PA-C Authorized by: Edie Rosebud LABOR, PA-C   Critical care provider  statement:    Critical care time (minutes):  35   Critical care was necessary to treat or prevent imminent or life-threatening deterioration of the following conditions:  Cardiac failure (angina, admission for heart cath, IV nitro)   Critical care was time spent personally by me on the following activities:  Development of treatment plan with patient or surrogate,  discussions with consultants, evaluation of patient's response to treatment, examination of patient, ordering and review of laboratory studies, ordering and review of radiographic studies, ordering and performing treatments and interventions, pulse oximetry, re-evaluation of patient's condition and review of old charts    Medications Ordered in the ED  aspirin  81 MG chewable tablet (has no administration in time range)  nitroGLYCERIN  (NITROSTAT ) SL tablet 0.4 mg (0.4 mg Sublingual Given 05/29/24 1717)  promethazine  (PHENERGAN ) 12.5 mg in sodium chloride  0.9 % 50 mL IVPB (0 mg Intravenous Stopped 05/29/24 1717)  heparin  injection 5,000 Units (has no administration in time range)  acetaminophen  (TYLENOL ) tablet 1,000 mg (has no administration in time range)    Or  acetaminophen  (TYLENOL ) suppository 650 mg (has no administration in time range)  senna-docusate (Senokot-S) tablet 1 tablet (has no administration in time range)  amLODipine  (NORVASC ) tablet 5 mg (has no administration in time range)  aspirin  EC tablet 81 mg (has no administration in time range)  busPIRone  (BUSPAR ) tablet 20 mg (has no administration in time range)  levothyroxine  (SYNTHROID ) tablet 25 mcg (has no administration in time range)  pantoprazole  (PROTONIX ) EC tablet 40 mg (has no administration in time range)  traMADol  (ULTRAM ) tablet 50 mg (has no administration in time range)  aspirin  chewable tablet 243 mg (243 mg Oral Given 05/29/24 1357)  morphine  (PF) 4 MG/ML injection 4 mg (4 mg Intravenous Given 05/29/24 1550)  iohexol  (OMNIPAQUE ) 350 MG/ML injection 75 mL (75  mLs Intravenous Contrast Given 05/29/24 1639)    Clinical Course as of 05/29/24 2115  Fri May 29, 2024  1555 Dr. Okey with Cards to see for evaluation  [BH]  2019 IM teaching to see for admission [BH]    Clinical Course User Index [BH] Toria Monte A, PA-C   53 year old here for evaluation of chest pain.  Has been ongoing issue for patient currently followed by cardiology in the outpatient setting.  She had a heart cath in August.  Still having some chest pain.  Worsened since yesterday.  Does not appear grossly fluid overloaded.  No history of PE or DVT.  She is tearful in the room.  Does have somewhat of a pleuritic component.  No fever, cough.  Plan labs, imaging, discussed with cardiology and reassess  Labs and imaging personally viewed and interpreted:  CBC without leukocytosis Metabolic panel without significant abnormality Trop 3---3 EKG without ischemic changes Chest x-ray without significant abnormality CT angio mild cardiomegaly, CAD, no PE  Discussed with Dr. Okey with cardiology.  Will evaluate patient in the ED.  Patient seen by cardiology.  Recommend admission to medicine.  Hold on IV heparin  at this time.  Discussed with IM teaching service.  Agreeable to evaluate patient for admission.  Patient family agreeable for admission.  Chest pain has improved at this time.  Initially wanted to hold off on additional nitroglycerin  as this gave her a headache earlier. Per Cards note rec starting IV nitroglycerin  in PA note however in attending note said could worsening sx? IMTS to reach out to Cards to clarify  The patient appears reasonably stabilized for admission considering the current resources, flow, and capabilities available in the ED at this time, and I doubt any other [facility] requiring further screening and/or treatment in the ED prior to admission.                                  Medical Decision  Making Amount and/or Complexity of Data Reviewed Independent  Historian: spouse    Details: ASL interpretor  External Data Reviewed: labs, radiology, ECG and notes. Labs: ordered. Decision-making details documented in ED Course. Radiology: ordered and independent interpretation performed. Decision-making details documented in ED Course. ECG/medicine tests: ordered and independent interpretation performed. Decision-making details documented in ED Course.  Risk OTC drugs. Prescription drug management. Parenteral controlled substances. Decision regarding hospitalization. Diagnosis or treatment significantly limited by social determinants of health.        Final diagnoses:  Angina at rest [facility])  Coronary artery disease involving native coronary artery of native heart with angina pectoris [facility])    ED Discharge Orders     None          Leaann Nevils A, PA-C 05/29/24 2115    Armenta Canning, MD 05/29/24 2345

## 2024-05-29 NOTE — Hospital Course (Addendum)
-  recurrent cp over month -s/p DES with recurrent cp -  3-vessel CAD (LAD 50-60%, LCx 90%, RCA 60-80% by vFFR, LVEDP 15) then underwent staged PCI 04/22/2024 for DES to RCA extending to ostium/proximal lesion and angioplasty with DES to mid/distal Lcx with OM branch   -no heparin    She notes that she is having that she is having some chest  pain that is wrapping down to her back. She states that she is frustrated because she felt like the cath was to help with this. She states that she is unclear on why she is having nasuea. She felt that her heart was racing and she woke up at 230. Yesterday she states that she has been having some pressure that was really bad. Went to her grandson presentation and then came to the Emergency department. She does have a new headache now.    Feels like theres a knife twisting in her left side chest pain. Constant since they did the cath, sayin gshe had some stress test. Trial off crestor  and go to atorvastatin.

## 2024-05-29 NOTE — ED Triage Notes (Signed)
 Pt feeling pressure in left chest and left arm pain. Started 2 days ago and has gotten worse.

## 2024-05-30 ENCOUNTER — Inpatient Hospital Stay (HOSPITAL_COMMUNITY)

## 2024-05-30 ENCOUNTER — Encounter (HOSPITAL_COMMUNITY): Payer: Self-pay | Admitting: Infectious Diseases

## 2024-05-30 DIAGNOSIS — I25119 Atherosclerotic heart disease of native coronary artery with unspecified angina pectoris: Secondary | ICD-10-CM | POA: Diagnosis not present

## 2024-05-30 DIAGNOSIS — E785 Hyperlipidemia, unspecified: Secondary | ICD-10-CM

## 2024-05-30 DIAGNOSIS — M791 Myalgia, unspecified site: Secondary | ICD-10-CM | POA: Diagnosis not present

## 2024-05-30 DIAGNOSIS — F419 Anxiety disorder, unspecified: Secondary | ICD-10-CM

## 2024-05-30 DIAGNOSIS — I25110 Atherosclerotic heart disease of native coronary artery with unstable angina pectoris: Secondary | ICD-10-CM

## 2024-05-30 DIAGNOSIS — R5383 Other fatigue: Secondary | ICD-10-CM

## 2024-05-30 DIAGNOSIS — G43909 Migraine, unspecified, not intractable, without status migrainosus: Secondary | ICD-10-CM

## 2024-05-30 DIAGNOSIS — E039 Hypothyroidism, unspecified: Secondary | ICD-10-CM | POA: Diagnosis not present

## 2024-05-30 DIAGNOSIS — I10 Essential (primary) hypertension: Secondary | ICD-10-CM | POA: Diagnosis not present

## 2024-05-30 DIAGNOSIS — K219 Gastro-esophageal reflux disease without esophagitis: Secondary | ICD-10-CM

## 2024-05-30 DIAGNOSIS — R079 Chest pain, unspecified: Secondary | ICD-10-CM | POA: Diagnosis not present

## 2024-05-30 LAB — ECHOCARDIOGRAM COMPLETE
Height: 60 in
Weight: 2941.82 [oz_av]

## 2024-05-30 LAB — CBC
HCT: 37 % (ref 36.0–46.0)
Hemoglobin: 12.7 g/dL (ref 12.0–15.0)
MCH: 30.8 pg (ref 26.0–34.0)
MCHC: 34.3 g/dL (ref 30.0–36.0)
MCV: 89.8 fL (ref 80.0–100.0)
Platelets: 227 K/uL (ref 150–400)
RBC: 4.12 MIL/uL (ref 3.87–5.11)
RDW: 11.8 % (ref 11.5–15.5)
WBC: 7.5 K/uL (ref 4.0–10.5)
nRBC: 0 % (ref 0.0–0.2)

## 2024-05-30 LAB — BASIC METABOLIC PANEL WITH GFR
Anion gap: 10 (ref 5–15)
BUN: 7 mg/dL (ref 6–20)
CO2: 23 mmol/L (ref 22–32)
Calcium: 9 mg/dL (ref 8.9–10.3)
Chloride: 103 mmol/L (ref 98–111)
Creatinine, Ser: 0.77 mg/dL (ref 0.44–1.00)
GFR, Estimated: >60 mL/min (ref >60)
Glucose, Bld: 110 mg/dL — ABNORMAL HIGH (ref 70–99)
Potassium: 3.5 mmol/L (ref 3.5–5.1)
Sodium: 136 mmol/L (ref 135–145)

## 2024-05-30 LAB — HIV ANTIBODY (ROUTINE TESTING W REFLEX): HIV Screen 4th Generation wRfx: NONREACTIVE

## 2024-05-30 LAB — TSH: TSH: 4.634 u[IU]/mL — ABNORMAL HIGH (ref 0.350–4.500)

## 2024-05-30 LAB — CK: Total CK: 35 U/L — ABNORMAL LOW (ref 38–234)

## 2024-05-30 MED ORDER — PROCHLORPERAZINE EDISYLATE 10 MG/2ML IJ SOLN: 10.0000 mg | Freq: Once | INTRAMUSCULAR | Status: DC

## 2024-05-30 MED ORDER — DIPHENHYDRAMINE HCL 50 MG/ML IJ SOLN: 12.5000 mg | Freq: Once | INTRAMUSCULAR | Status: DC

## 2024-05-30 NOTE — ED Notes (Signed)
 Pt. Ambulated to restroom with no assistance. Pt. Provided with more water  and returned to bed

## 2024-05-30 NOTE — ED Notes (Signed)
Admitting MD/ team at [facility] 

## 2024-05-30 NOTE — ED Notes (Signed)
 MD at [facility]

## 2024-05-30 NOTE — Progress Notes (Signed)
 Admission health history and assessment completed using ASL in person interpreter

## 2024-05-30 NOTE — Progress Notes (Addendum)
   05/30/24 1321  Vitals  BP 113/75  MAP (mmHg) 87  BP Location Left Arm  BP Method Automatic  Patient Position (if appropriate) Sitting  ECG Heart Rate 65  Level of Consciousness  Level of Consciousness Alert  MEWS COLOR  MEWS Score Color Green  Oxygen Therapy  O2 Device Room Air  MEWS Score  MEWS Temp 0  MEWS Systolic 0  MEWS Pulse 0  MEWS RR 0  MEWS LOC 0  MEWS Score 0   New patient admit from ED

## 2024-05-30 NOTE — Progress Notes (Signed)
 Patient admitted to [facility].  Spoke to [place] with [employer], for assistance with having on site interpreter to come assist with admission.

## 2024-05-30 NOTE — ED Notes (Signed)
 Patient ambulated to the bathroom without assistance. When patient returned to room patient reported dizziness & nausea. Patient assisted back into bed, given ginger ale patient reports improvement of symptoms.

## 2024-05-30 NOTE — Progress Notes (Signed)
 HD#1 SUBJECTIVE:  Patient Summary: Donna Davis is a 53 y.o. with a pertinent PMH of CAD s/p DES 04/22/2024, HTN, HLD, Hypothyroidism, GERD, GAD and deafness who presented with chest pain and admitted for unstable angina.   Overnight Events: NAEON  Interim History: Patient still with chest pain that wraps around to her back. Still experiencing fatigue. She is frustrated that she had the catheterization last month and is still experiencing chest pain. She feels like it should have been mostly resolved by now.   OBJECTIVE:  Vital Signs: Vitals:   05/30/24 1030 05/30/24 1045 05/30/24 1321 05/30/24 1328  BP:   113/75   Pulse:      Resp: 19 20    Temp:    98.3 F (36.8 C)  TempSrc:    Oral  SpO2: 100% 96%    Weight:      Height:        Filed Weights   05/29/24 1350  Weight: 83.4 kg     Intake/Output Summary (Last 24 hours) at 05/30/2024 1359 Last data filed at 05/29/2024 1717 Gross per 24 hour  Intake 50 ml  Output --  Net 50 ml   Net IO Since Admission: 50 mL [05/30/24 1359]  Physical Exam: Constitutional: well-appearing, obese, in no acute distress HENT: normocephalic atraumatic, mucous membranes moist; deaf Eyes: conjunctiva non-erythematous, PERRL, no scleral icterus Cardiovascular: regular rate and rhythm, no m/r/g Pulmonary/Chest: normal work of breathing on room air, lungs clear to auscultation bilaterally Abdominal: soft, non-tender, non-distended, bowel sounds normal MSK: normal bulk and tone Neurological: alert & oriented x3 Skin: warm and dry Extremities: mild BLE edema, no cyanosis; peripheral pulses intact Psych: normal mood and affect, thought content normal  Patient Lines/Drains/Airways Status     Active Line/Drains/Airways     Name Placement date Placement time Site Days   Peripheral IV 05/29/24 20 G Left Antecubital 05/29/24  1523  Antecubital  1            Pertinent labs and imaging:     Latest Ref Rng & Units 05/30/2024    4:32 AM  05/29/2024    2:08 PM 05/29/2024    1:56 PM  CBC  WBC 4.0 - 10.5 K/uL 7.5   8.6   Hemoglobin 12.0 - 15.0 g/dL 87.2  86.3  86.2   Hematocrit 36.0 - 46.0 % 37.0  40.0  41.1   Platelets 150 - 400 K/uL 227   270        Latest Ref Rng & Units 05/30/2024    4:32 AM 05/29/2024    2:08 PM 05/29/2024    1:56 PM  CMP  Glucose 70 - 99 mg/dL 889  91  90   BUN 6 - 20 mg/dL 7  17  15    Creatinine 0.44 - 1.00 mg/dL 9.22  9.29  9.26   Sodium 135 - 145 mmol/L 136  135  135   Potassium 3.5 - 5.1 mmol/L 3.5  4.4  4.5   Chloride 98 - 111 mmol/L 103  103  102   CO2 22 - 32 mmol/L 23   21   Calcium  8.9 - 10.3 mg/dL 9.0   9.7   Total Protein 6.5 - 8.1 g/dL   7.3   Total Bilirubin 0.0 - 1.2 mg/dL   0.6   Alkaline Phos 38 - 126 U/L   42   AST 15 - 41 U/L   21   ALT 0 - 44 U/L   15  CT Angio Chest PE W and/or Wo Contrast Result Date: 05/29/2024 CLINICAL DATA:  Left chest pressure and arm pain EXAM: CT ANGIOGRAPHY CHEST WITH CONTRAST TECHNIQUE: Multidetector CT imaging of the chest was performed using the standard protocol during bolus administration of intravenous contrast. Multiplanar CT image reconstructions and MIPs were obtained to evaluate the vascular anatomy. RADIATION DOSE REDUCTION: This exam was performed according to the departmental dose-optimization program which includes automated exposure control, adjustment of the mA and/or kV according to patient size and/or use of iterative reconstruction technique. CONTRAST:  75mL OMNIPAQUE  IOHEXOL  350 MG/ML SOLN COMPARISON:  Chest x-ray 05/29/2024, chest CT 03/03/2024 FINDINGS: Cardiovascular: Satisfactory opacification of the pulmonary arteries to the segmental level. No evidence of pulmonary embolism. Nonaneurysmal aorta. No dissection. Multi vessel coronary vascular calcification. Borderline cardiomegaly. No significant pericardial effusion Mediastinum/Nodes: Patent trachea. No thyroid  mass. No suspicious lymph nodes. Esophagus within normal limits.  Lungs/Pleura: No acute airspace disease, pleural effusion, or pneumothorax Upper Abdomen: No acute finding Musculoskeletal: No acute osseous abnormality Review of the MIP images confirms the above findings. IMPRESSION: 1. Negative for acute pulmonary embolus or aortic dissection. 2. Borderline to mild cardiomegaly with multi vessel coronary vascular calcification. Electronically Signed   By: Luke Bun M.D.   On: 05/29/2024 16:50   DG Chest 2 View Result Date: 05/29/2024 CLINICAL DATA:  Chest pain. EXAM: CHEST - 2 VIEW COMPARISON:  04/17/2024. FINDINGS: The heart size and mediastinal contours are within unchanged. No focal consolidation, pleural effusion, or pneumothorax. Levocurvature of the thoracic spine. No acute osseous abnormality. IMPRESSION: No acute cardiopulmonary findings. Electronically Signed   By: Harrietta Sherry M.D.   On: 05/29/2024 15:06    ASSESSMENT/PLAN:  Assessment: Principal Problem:   Angina at rest [facility]) Active Problems:   Hypothyroidism   Anxiety state   GERD   Bilateral deafness   Palpitations   CAD (coronary artery disease)   Primary hypertension   Plan: #Unstable Angina #Coronary Artery Disease Today patient still reporting chest pain that is wrapping down to her back.  Recent cath on 04/20/2024 showing LAD disease, distal left circumflex, and proximal/mid RCA stenosis.  Status post DES on 8/6 to proximal/mid RCA and mid/distal LCx.  Also had a negative echo stress test for ischemia on 04/17/2024.  Cardiac auscultation normal.  Troponins are negative.  EKG on admission without ST/T wave changes.  Updated TTE pending.  Symptoms may be due to obstructive disease in the LAD or microvascular dysfunction.  Cardiology is following this patient and offered outpatient catheterization, but the patient was anxious about symptoms and would like to continue with the plan for inpatient cath on 06/01/2024. - Cardiology consulted, appreciate recs:  - Continue Aspirin  81 mg  daily  - Continue prasugrel  10 mg daily  - Continue Toprol -XL 50 mg daily  - Continue rosuvastatin  20 mg daily  - Sublingual nitroglycerin  as needed  - TTE pending  - Cardiac catheterization planned for Monday (9/15) - Continuous cardiac monitoring  #Migraine Headaches Patient reporting worse headache after taking nitroglycerin  but notes a prior history of migraines. States that Zofran  is a headache trigger for her.  - HA cocktail with IV Benadryl  12.5 mg/IV Compazine  10 mg  - Phenergan  12.5 mg every 6 hours as needed for nausea/vomiting - Tramadol  50 mg every 6 hours as needed for severe pain  #Hypothyroidism #Myalgias #Fatigue Patient reporting myalgias and fatigue immediately prior to admission.  Also having increased need for sleep.  She did recently start metoprolol  XL 50 mg and  noted with prior propranolol  use that she was fatigued as well.  Also recently stopped estradiol  due to contraindication with CAD.  Prior TSH 2 months ago was 2.370 but recheck during this admission elevated at 4.634. CK wnl.  Etiology of symptoms likely multifactorial in the setting of medication adverse effect and vasomotor symptoms from menopause.  Will continue home Synthroid  and consider increasing dose. - Continue Synthroid  25 mcg daily - Outpatient follow-up with OB/GYN for menopause  #Anxiety Per chart review, the patient has a history of anxiety with current medications as BuSpar  daily and Klonopin  as needed.  Her anxiety may be a significant contributor to her symptoms of chest pain, but do not doubt cardiac nature of her pain.  We will continue her home medications and further adjustments can be made in the outpatient setting. - PO BuSpar  20 mg 3 times daily - PO Klonopin  0.5 mg nightly as needed for anxiety  #History of hypertension She has been mostly normotensive with episodes of hypotension during this hospitalization.  Will discontinue amlodipine  due to low pressures. - Discontinue  amlodipine   #Hyperlipidemia Last LDL of 109 which is not at goal.  Will continue home medications and follow further guidance from cardiology.  She can follow-up in the outpatient setting. - Continue rosuvastatin  20 mg daily  #GERD - Continue home pantoprazole  40 mg daily  Best Practice: Diet: Heart-Healthy IVF: Fluids: None, Rate: None VTE: heparin  injection 5,000 Units Start: 05/29/24 2200 Code: Full  Disposition planning: Therapy Recs: Pending, DME: none Family Contact: Reyes Johann (Husband), to be notified. DISPO: Anticipated discharge to Home pending cardiac catheterization on Monday.  Signature:  Letha Cheadle, MD [facility]  PGY-1 05/30/2024, 1:59 PM  On Call pager [phone]

## 2024-05-30 NOTE — Progress Notes (Signed)
 Rounding Note   Patient Name: Donna Davis Baptist Date of Encounter: 05/30/2024  [facility] Cardiologist: Ozell Fell, MD   Subjective BP 95/65.  Creatinine 0.77, hemoglobin 12.7.  Reports she continues to have chest pain and dyspnea  Scheduled Meds:  aspirin  EC  81 mg Oral Daily   busPIRone   20 mg Oral TID   heparin   5,000 Units Subcutaneous Q8H   levothyroxine   25 mcg Oral Q0600   metoprolol  succinate  50 mg Oral Daily   pantoprazole   40 mg Oral Daily   prasugrel   10 mg Oral Daily   rosuvastatin   20 mg Oral Daily   Continuous Infusions:  promethazine  (PHENERGAN ) injection (IM or IVPB) Stopped (05/29/24 1717)   PRN Meds: acetaminophen  **OR** acetaminophen , clonazePAM , nitroGLYCERIN , promethazine  (PHENERGAN ) injection (IM or IVPB), senna-docusate, traMADol    Vital Signs  Vitals:   05/30/24 0500 05/30/24 0600 05/30/24 0630 05/30/24 0700  BP: 103/79 97/66 100/61 95/65  Pulse:      Resp: 15 15 15 15   Temp:      TempSrc:      SpO2: 98% 96% 96% 96%  Weight:      Height:        Intake/Output Summary (Last 24 hours) at 05/30/2024 0747 Last data filed at 05/29/2024 1717 Gross per 24 hour  Intake 50 ml  Output --  Net 50 ml      05/29/2024    1:50 PM 05/13/2024    9:00 AM 05/01/2024    5:59 PM  Last 3 Weights  Weight (lbs) 183 lb 13.8 oz 183 lb 12.8 oz 185 lb 6.5 oz  Weight (kg) 83.4 kg 83.371 kg 84.1 kg      Telemetry Normal sinus rhythm- Personally Reviewed  ECG  No new ECG- Personally Reviewed  Physical Exam  GEN: No acute distress.   Neck: No JVD Cardiac: RRR, no murmurs, rubs, or gallops.  Respiratory: Clear to auscultation bilaterally. GI: Soft, nontender, non-distended  MS: No edema; No deformity. Neuro:  Nonfocal  Psych: Normal affect   Labs High Sensitivity Troponin:   Recent Labs  Lab 05/29/24 1356 05/29/24 1612  TROPONINIHS 3 3     Chemistry Recent Labs  Lab 05/29/24 1356 05/29/24 1408 05/30/24 0432  NA 135 135 136  K 4.5  4.4 3.5  CL 102 103 103  CO2 21*  --  23  GLUCOSE 90 91 110*  BUN 15 17 7   CREATININE 0.73 0.70 0.77  CALCIUM  9.7  --  9.0  PROT 7.3  --   --   ALBUMIN 4.2  --   --   AST 21  --   --   ALT 15  --   --   ALKPHOS 42  --   --   BILITOT 0.6  --   --   GFRNONAA >60  --  >60  ANIONGAP 12  --  10    Lipids No results for input(s): CHOL, TRIG, HDL, LABVLDL, LDLCALC, CHOLHDL in the last 168 hours.  Hematology Recent Labs  Lab 05/29/24 1356 05/29/24 1408 05/30/24 0432  WBC 8.6  --  7.5  RBC 4.52  --  4.12  HGB 13.7 13.6 12.7  HCT 41.1 40.0 37.0  MCV 90.9  --  89.8  MCH 30.3  --  30.8  MCHC 33.3  --  34.3  RDW 11.7  --  11.8  PLT 270  --  227   Thyroid   Recent Labs  Lab 05/30/24 0432  TSH 4.634*  BNPNo results for input(s): BNP, PROBNP in the last 168 hours.  DDimer No results for input(s): DDIMER in the last 168 hours.   Radiology  CT Angio Chest PE W and/or Wo Contrast Result Date: 05/29/2024 CLINICAL DATA:  Left chest pressure and arm pain EXAM: CT ANGIOGRAPHY CHEST WITH CONTRAST TECHNIQUE: Multidetector CT imaging of the chest was performed using the standard protocol during bolus administration of intravenous contrast. Multiplanar CT image reconstructions and MIPs were obtained to evaluate the vascular anatomy. RADIATION DOSE REDUCTION: This exam was performed according to the departmental dose-optimization program which includes automated exposure control, adjustment of the mA and/or kV according to patient size and/or use of iterative reconstruction technique. CONTRAST:  75mL OMNIPAQUE  IOHEXOL  350 MG/ML SOLN COMPARISON:  Chest x-ray 05/29/2024, chest CT 03/03/2024 FINDINGS: Cardiovascular: Satisfactory opacification of the pulmonary arteries to the segmental level. No evidence of pulmonary embolism. Nonaneurysmal aorta. No dissection. Multi vessel coronary vascular calcification. Borderline cardiomegaly. No significant pericardial effusion  Mediastinum/Nodes: Patent trachea. No thyroid  mass. No suspicious lymph nodes. Esophagus within normal limits. Lungs/Pleura: No acute airspace disease, pleural effusion, or pneumothorax Upper Abdomen: No acute finding Musculoskeletal: No acute osseous abnormality Review of the MIP images confirms the above findings. IMPRESSION: 1. Negative for acute pulmonary embolus or aortic dissection. 2. Borderline to mild cardiomegaly with multi vessel coronary vascular calcification. Electronically Signed   By: Luke Bun M.D.   On: 05/29/2024 16:50   DG Chest 2 View Result Date: 05/29/2024 CLINICAL DATA:  Chest pain. EXAM: CHEST - 2 VIEW COMPARISON:  04/17/2024. FINDINGS: The heart size and mediastinal contours are within unchanged. No focal consolidation, pleural effusion, or pneumothorax. Levocurvature of the thoracic spine. No acute osseous abnormality. IMPRESSION: No acute cardiopulmonary findings. Electronically Signed   By: Harrietta Sherry M.D.   On: 05/29/2024 15:06    Cardiac Studies   Patient Profile   53 y.o. female with hypertension, hyperlipidemia, CAD, recent PCI to mid RCA and mid left circumflex in 04/2024, h/o SVT, deafness, now with recurrent chest pain   Assessment & Plan   Chest pain: Cath 04/20/2024 showed diffuse mid LAD disease up to 50 to 60%, 90% distal LCx and sequential 60 to 80% proximal/mid RCA stenosis.  On 8/6 underwent DES to proximal/mid RCA and DES to mid/distal LCx.  Now presenting with recurrent chest pain.  Troponins negative.  Dr. Elmira discussed options with patient on 9/12 and plan for LHC on 9/15. -Continue aspirin  81 mg daily, prasugrel  10 mg daily -Continue Toprol -XL 50 mg daily -As needed sublingual nitroglycerin  -Continue rosuvastatin  20 mg daily -Follow-up echocardiogram  Hypertension: On amlodipine  5 mg daily and Toprol -XL 50 mg daily  Hyperlipidemia: On rosuvastatin  20 mg daily  For questions or updates, please contact [facility] Please  consult [email or web address] for contact info under       Signed, Lonni LITTIE Nanas, MD  05/30/2024, 7:47 AM

## 2024-05-31 ENCOUNTER — Inpatient Hospital Stay (HOSPITAL_COMMUNITY)

## 2024-05-31 DIAGNOSIS — I25119 Atherosclerotic heart disease of native coronary artery with unspecified angina pectoris: Secondary | ICD-10-CM | POA: Diagnosis not present

## 2024-05-31 DIAGNOSIS — I25110 Atherosclerotic heart disease of native coronary artery with unstable angina pectoris: Secondary | ICD-10-CM | POA: Diagnosis not present

## 2024-05-31 DIAGNOSIS — E785 Hyperlipidemia, unspecified: Secondary | ICD-10-CM | POA: Diagnosis not present

## 2024-05-31 DIAGNOSIS — I10 Essential (primary) hypertension: Secondary | ICD-10-CM | POA: Diagnosis not present

## 2024-05-31 DIAGNOSIS — R079 Chest pain, unspecified: Secondary | ICD-10-CM

## 2024-05-31 DIAGNOSIS — F419 Anxiety disorder, unspecified: Secondary | ICD-10-CM | POA: Diagnosis not present

## 2024-05-31 DIAGNOSIS — K219 Gastro-esophageal reflux disease without esophagitis: Secondary | ICD-10-CM | POA: Diagnosis not present

## 2024-05-31 LAB — ECHOCARDIOGRAM LIMITED
Area-P 1/2: 3.56 cm2
Height: 60 in
S' Lateral: 3 cm
Weight: 2941.82 [oz_av]

## 2024-05-31 MED ORDER — FREE WATER
500.0000 mL | Freq: Once | Status: AC
  Administered 2024-06-01: 500 mL via ORAL

## 2024-05-31 NOTE — Progress Notes (Signed)
 Consent form was signed by patient with the assistance of ASL in person interpreter Ms. Carline.

## 2024-05-31 NOTE — Progress Notes (Signed)
 Rounding Note   Patient Name: Donna Davis Date of Encounter: 05/31/2024  [facility] Cardiologist: Ozell Fell, MD   Subjective BP 114/64.  Reports she continues to have chest pain  Scheduled Meds:  aspirin  EC  81 mg Oral Daily   busPIRone   20 mg Oral TID   diphenhydrAMINE   12.5 mg Intravenous Once   Followed by   prochlorperazine   10 mg Intravenous Once   heparin   5,000 Units Subcutaneous Q8H   levothyroxine   25 mcg Oral Q0600   metoprolol  succinate  50 mg Oral Daily   pantoprazole   40 mg Oral Daily   prasugrel   10 mg Oral Daily   rosuvastatin   20 mg Oral Daily   Continuous Infusions:  promethazine  (PHENERGAN ) injection (IM or IVPB) Stopped (05/29/24 1717)   PRN Meds: acetaminophen  **OR** acetaminophen , clonazePAM , nitroGLYCERIN , promethazine  (PHENERGAN ) injection (IM or IVPB), senna-docusate, traMADol    Vital Signs  Vitals:   05/30/24 1735 05/30/24 2013 05/31/24 0003 05/31/24 0508  BP: 118/70 115/76 (!) 112/57 114/64  Pulse: 63 65 64 67  Resp: 18 18 15 16   Temp: 98.5 F (36.9 C) 98.5 F (36.9 C) 98.3 F (36.8 C) 98.7 F (37.1 C)  TempSrc: Oral Oral Oral Oral  SpO2:  98% 98% 97%  Weight:      Height:       No intake or output data in the 24 hours ending 05/31/24 0948     05/29/2024    1:50 PM 05/13/2024    9:00 AM 05/01/2024    5:59 PM  Last 3 Weights  Weight (lbs) 183 lb 13.8 oz 183 lb 12.8 oz 185 lb 6.5 oz  Weight (kg) 83.4 kg 83.371 kg 84.1 kg      Telemetry Normal sinus rhythm- Personally Reviewed  ECG  No new ECG- Personally Reviewed  Physical Exam  GEN: No acute distress.   Neck: No JVD Cardiac: RRR, no murmurs, rubs, or gallops.  Respiratory: Clear to auscultation bilaterally. GI: Soft, nontender, non-distended  MS: No edema; No deformity. Neuro:  Nonfocal  Psych: Normal affect   Labs High Sensitivity Troponin:   Recent Labs  Lab 05/29/24 1356 05/29/24 1612  TROPONINIHS 3 3     Chemistry Recent Labs  Lab  05/29/24 1356 05/29/24 1408 05/30/24 0432  NA 135 135 136  K 4.5 4.4 3.5  CL 102 103 103  CO2 21*  --  23  GLUCOSE 90 91 110*  BUN 15 17 7   CREATININE 0.73 0.70 0.77  CALCIUM  9.7  --  9.0  PROT 7.3  --   --   ALBUMIN 4.2  --   --   AST 21  --   --   ALT 15  --   --   ALKPHOS 42  --   --   BILITOT 0.6  --   --   GFRNONAA >60  --  >60  ANIONGAP 12  --  10    Lipids No results for input(s): CHOL, TRIG, HDL, LABVLDL, LDLCALC, CHOLHDL in the last 168 hours.  Hematology Recent Labs  Lab 05/29/24 1356 05/29/24 1408 05/30/24 0432  WBC 8.6  --  7.5  RBC 4.52  --  4.12  HGB 13.7 13.6 12.7  HCT 41.1 40.0 37.0  MCV 90.9  --  89.8  MCH 30.3  --  30.8  MCHC 33.3  --  34.3  RDW 11.7  --  11.8  PLT 270  --  227   Thyroid   Recent Labs  Lab 05/30/24 0432  TSH 4.634*    BNPNo results for input(s): BNP, PROBNP in the last 168 hours.  DDimer No results for input(s): DDIMER in the last 168 hours.   Radiology  CT Angio Chest PE W and/or Wo Contrast Result Date: 05/29/2024 CLINICAL DATA:  Left chest pressure and arm pain EXAM: CT ANGIOGRAPHY CHEST WITH CONTRAST TECHNIQUE: Multidetector CT imaging of the chest was performed using the standard protocol during bolus administration of intravenous contrast. Multiplanar CT image reconstructions and MIPs were obtained to evaluate the vascular anatomy. RADIATION DOSE REDUCTION: This exam was performed according to the departmental dose-optimization program which includes automated exposure control, adjustment of the mA and/or kV according to patient size and/or use of iterative reconstruction technique. CONTRAST:  75mL OMNIPAQUE  IOHEXOL  350 MG/ML SOLN COMPARISON:  Chest x-ray 05/29/2024, chest CT 03/03/2024 FINDINGS: Cardiovascular: Satisfactory opacification of the pulmonary arteries to the segmental level. No evidence of pulmonary embolism. Nonaneurysmal aorta. No dissection. Multi vessel coronary vascular calcification.  Borderline cardiomegaly. No significant pericardial effusion Mediastinum/Nodes: Patent trachea. No thyroid  mass. No suspicious lymph nodes. Esophagus within normal limits. Lungs/Pleura: No acute airspace disease, pleural effusion, or pneumothorax Upper Abdomen: No acute finding Musculoskeletal: No acute osseous abnormality Review of the MIP images confirms the above findings. IMPRESSION: 1. Negative for acute pulmonary embolus or aortic dissection. 2. Borderline to mild cardiomegaly with multi vessel coronary vascular calcification. Electronically Signed   By: Luke Bun M.D.   On: 05/29/2024 16:50   DG Chest 2 View Result Date: 05/29/2024 CLINICAL DATA:  Chest pain. EXAM: CHEST - 2 VIEW COMPARISON:  04/17/2024. FINDINGS: The heart size and mediastinal contours are within unchanged. No focal consolidation, pleural effusion, or pneumothorax. Levocurvature of the thoracic spine. No acute osseous abnormality. IMPRESSION: No acute cardiopulmonary findings. Electronically Signed   By: Harrietta Sherry M.D.   On: 05/29/2024 15:06    Cardiac Studies   Patient Profile   53 y.o. female with hypertension, hyperlipidemia, CAD, recent PCI to mid RCA and mid left circumflex in 04/2024, h/o SVT, deafness, now with recurrent chest pain   Assessment & Plan   Chest pain: Cath 04/20/2024 showed diffuse mid LAD disease up to 50 to 60%, 90% distal LCx and sequential 60 to 80% proximal/mid RCA stenosis.  On 8/6 underwent DES to proximal/mid RCA and DES to mid/distal LCx.  Now presenting with recurrent chest pain.  Troponins negative.   -Continue aspirin  81 mg daily, prasugrel  10 mg daily -Continue Toprol -XL 50 mg daily -As needed sublingual nitroglycerin  -Continue rosuvastatin  20 mg daily -Follow-up echocardiogram -Dr. Elmira discussed options with patient on 9/12 and plan for LHC on 9/15. Risks and benefits of cardiac catheterization have been discussed with the patient.  These include bleeding, infection,  kidney damage, stroke, heart attack, death.  The patient understands these risks and is willing to proceed.  Hypertension: On amlodipine  5 mg daily and Toprol -XL 50 mg daily  Hyperlipidemia: On rosuvastatin  20 mg daily  For questions or updates, please contact [facility] Please consult [email or web address] for contact info under       Signed, Donna LITTIE Nanas, MD  05/31/2024, 9:48 AM

## 2024-05-31 NOTE — Progress Notes (Signed)
  Echocardiogram 2D Echocardiogram has been performed.  Tinnie FORBES Gosling RDCS 05/31/2024, 10:03 AM

## 2024-05-31 NOTE — Progress Notes (Signed)
 Patient has requested that clonazepam  be given before her procedure tomorrow. MD notified.

## 2024-05-31 NOTE — Progress Notes (Signed)
   05/31/24 1700  Spiritual Encounters  Type of Visit Initial  Care provided to: Pt and family  Conversation partners present during encounter Other (comment) Garment/textile technologist for deaf)  Referral source Nurse (RN/NT/LPN);Patient request  Reason for visit Advance directives  OnCall Visit Yes  Interventions  Spiritual Care Interventions Made Established relationship of care and support;Compassionate presence;Reflective listening;Encouragement   Chaplain responded to spiritual page to consult with patient regarding Advanced Directive questions.  Chaplain confirmed with patient that her spouse will be able to make medical decisions in the event she is unable to advocate for herself.  No HCPOA completed at this time per patient request as husband will have spousal rights to medical decision making.

## 2024-05-31 NOTE — Progress Notes (Addendum)
 HD#2 Subjective:   Summary: This is a 53 year old female with a past medical history of CAD status post cath with DES on 04/22/2024 to RCA and distal left circumflex who presented to the emergency department with concerns of chest pain.  Patient admitted for further evaluation and management of unstable angina.  Overnight Events: No acute events overnight  Patient evaluated at bedside this morning.  Headaches better.  She states she still having some chest pressure, but otherwise she is slowly improving.  She is ready for cath tomorrow.  She asked about her ultrasound.  I stated this will be getting done today.  Objective:  Vital signs in last 24 hours: Vitals:   05/30/24 1735 05/30/24 2013 05/31/24 0003 05/31/24 0508  BP: 118/70 115/76 (!) 112/57 114/64  Pulse: 63 65 64 67  Resp: 18 18 15 16   Temp: 98.5 F (36.9 C) 98.5 F (36.9 C) 98.3 F (36.8 C) 98.7 F (37.1 C)  TempSrc: Oral Oral Oral Oral  SpO2:  98% 98% 97%  Weight:      Height:       Supplemental O2: Room Air SpO2: 97 %   Physical Exam:  Constitutional: Well-appearing, resting in bed, no acute distress HENT: normocephalic atraumatic Cardiovascular: regular rate and rhythm, no m/r/g Pulmonary/Chest: normal work of breathing on room air, lungs clear to auscultation bilaterally   Filed Weights   05/29/24 1350  Weight: 83.4 kg    No intake or output data in the 24 hours ending 05/31/24 0602 Net IO Since Admission: 50 mL [05/31/24 0602]  Pertinent Labs:    Latest Ref Rng & Units 05/30/2024    4:32 AM 05/29/2024    2:08 PM 05/29/2024    1:56 PM  CBC  WBC 4.0 - 10.5 K/uL 7.5   8.6   Hemoglobin 12.0 - 15.0 g/dL 87.2  86.3  86.2   Hematocrit 36.0 - 46.0 % 37.0  40.0  41.1   Platelets 150 - 400 K/uL 227   270        Latest Ref Rng & Units 05/30/2024    4:32 AM 05/29/2024    2:08 PM 05/29/2024    1:56 PM  CMP  Glucose 70 - 99 mg/dL 889  91  90   BUN 6 - 20 mg/dL 7  17  15    Creatinine 0.44 - 1.00 mg/dL  9.22  9.29  9.26   Sodium 135 - 145 mmol/L 136  135  135   Potassium 3.5 - 5.1 mmol/L 3.5  4.4  4.5   Chloride 98 - 111 mmol/L 103  103  102   CO2 22 - 32 mmol/L 23   21   Calcium  8.9 - 10.3 mg/dL 9.0   9.7   Total Protein 6.5 - 8.1 g/dL   7.3   Total Bilirubin 0.0 - 1.2 mg/dL   0.6   Alkaline Phos 38 - 126 U/L   42   AST 15 - 41 U/L   21   ALT 0 - 44 U/L   15     Imaging: No results found.  Assessment/Plan:   Principal Problem:   Angina at rest [facility]) Active Problems:   Hypothyroidism   Anxiety state   GERD   Bilateral deafness   Palpitations   CAD (coronary artery disease)   Primary hypertension   Patient Summary: Donna Davis is a 53 y.o. female with a past medical history of CAD status post cath with DES on 04/22/2024 to  RCA and distal left circumflex who presented to the emergency department with concerns of chest pain.  Patient admitted for further evaluation and management of unstable angina.  #Unstable angina #CAD status post DES Patient evaluated bedside this morning.  Patient still having chest pressure.  She does report that she is improving day by day.  Plan will be for patient to have heart cath on 06/01/2024.  Currently patient is doing well.  No further chest pain concerns.  Echo has not been completed yet. - Follow-up TTE - Continue aspirin  81 mg daily - Continue prasugrel  10 mg daily - Continue metoprolol  succinate 50 mg daily - Continue rosuvastatin  20 mg daily  #Nitroglycerin  induced migraine Migraines have improved.  No longer having any headaches.  Likely secondary to nitroglycerin . -Continue to monitor  #Hypertension Blood pressure measuring well at this time.  Given patient was slightly hypotensive yesterday, holding amlodipine . - Continue home metoprolol  succinate 50 mg daily - Hold home amlodipine  10 mg daily  #Hyperlipidemia - Continue home rosuvastatin  20 mg daily  #GERD -Continue home pantoprazole  40 mg daily  #Hypothyroidism No  acute concerns at this time - Continue Synthroid  25 mcg daily  #Anxiety - Continue home BuSpar  20 mg TID - As needed Klonopin    Diet: Heart Healthy IVF: None,None VTE: Heparin  Code: Full PT/OT recs: None, none.  Dispo: Anticipated discharge to Home in 3 days pending clinical improvement.   Libby Blanch DO Internal Medicine Resident PGY-3 Please contact the on call pager after 5 pm and on weekends at [phone]

## 2024-05-31 NOTE — Progress Notes (Signed)
 Spoke to [employer], [place] (on department) about need for on site sign language interpreter during her cath on Monday 06/01/2024.  He stated he would let Delon (cath lab scheduling) know of this need.

## 2024-05-31 NOTE — H&P (View-Only) (Signed)
 Rounding Note   Patient Name: Donna Davis Baptist Date of Encounter: 05/31/2024  [facility] Cardiologist: Ozell Fell, MD   Subjective BP 114/64.  Reports she continues to have chest pain  Scheduled Meds:  aspirin  EC  81 mg Oral Daily   busPIRone   20 mg Oral TID   diphenhydrAMINE   12.5 mg Intravenous Once   Followed by   prochlorperazine   10 mg Intravenous Once   heparin   5,000 Units Subcutaneous Q8H   levothyroxine   25 mcg Oral Q0600   metoprolol  succinate  50 mg Oral Daily   pantoprazole   40 mg Oral Daily   prasugrel   10 mg Oral Daily   rosuvastatin   20 mg Oral Daily   Continuous Infusions:  promethazine  (PHENERGAN ) injection (IM or IVPB) Stopped (05/29/24 1717)   PRN Meds: acetaminophen  **OR** acetaminophen , clonazePAM , nitroGLYCERIN , promethazine  (PHENERGAN ) injection (IM or IVPB), senna-docusate, traMADol    Vital Signs  Vitals:   05/30/24 1735 05/30/24 2013 05/31/24 0003 05/31/24 0508  BP: 118/70 115/76 (!) 112/57 114/64  Pulse: 63 65 64 67  Resp: 18 18 15 16   Temp: 98.5 F (36.9 C) 98.5 F (36.9 C) 98.3 F (36.8 C) 98.7 F (37.1 C)  TempSrc: Oral Oral Oral Oral  SpO2:  98% 98% 97%  Weight:      Height:       No intake or output data in the 24 hours ending 05/31/24 0948     05/29/2024    1:50 PM 05/13/2024    9:00 AM 05/01/2024    5:59 PM  Last 3 Weights  Weight (lbs) 183 lb 13.8 oz 183 lb 12.8 oz 185 lb 6.5 oz  Weight (kg) 83.4 kg 83.371 kg 84.1 kg      Telemetry Normal sinus rhythm- Personally Reviewed  ECG  No new ECG- Personally Reviewed  Physical Exam  GEN: No acute distress.   Neck: No JVD Cardiac: RRR, no murmurs, rubs, or gallops.  Respiratory: Clear to auscultation bilaterally. GI: Soft, nontender, non-distended  MS: No edema; No deformity. Neuro:  Nonfocal  Psych: Normal affect   Labs High Sensitivity Troponin:   Recent Labs  Lab 05/29/24 1356 05/29/24 1612  TROPONINIHS 3 3     Chemistry Recent Labs  Lab  05/29/24 1356 05/29/24 1408 05/30/24 0432  NA 135 135 136  K 4.5 4.4 3.5  CL 102 103 103  CO2 21*  --  23  GLUCOSE 90 91 110*  BUN 15 17 7   CREATININE 0.73 0.70 0.77  CALCIUM  9.7  --  9.0  PROT 7.3  --   --   ALBUMIN 4.2  --   --   AST 21  --   --   ALT 15  --   --   ALKPHOS 42  --   --   BILITOT 0.6  --   --   GFRNONAA >60  --  >60  ANIONGAP 12  --  10    Lipids No results for input(s): CHOL, TRIG, HDL, LABVLDL, LDLCALC, CHOLHDL in the last 168 hours.  Hematology Recent Labs  Lab 05/29/24 1356 05/29/24 1408 05/30/24 0432  WBC 8.6  --  7.5  RBC 4.52  --  4.12  HGB 13.7 13.6 12.7  HCT 41.1 40.0 37.0  MCV 90.9  --  89.8  MCH 30.3  --  30.8  MCHC 33.3  --  34.3  RDW 11.7  --  11.8  PLT 270  --  227   Thyroid   Recent Labs  Lab 05/30/24 0432  TSH 4.634*    BNPNo results for input(s): BNP, PROBNP in the last 168 hours.  DDimer No results for input(s): DDIMER in the last 168 hours.   Radiology  CT Angio Chest PE W and/or Wo Contrast Result Date: 05/29/2024 CLINICAL DATA:  Left chest pressure and arm pain EXAM: CT ANGIOGRAPHY CHEST WITH CONTRAST TECHNIQUE: Multidetector CT imaging of the chest was performed using the standard protocol during bolus administration of intravenous contrast. Multiplanar CT image reconstructions and MIPs were obtained to evaluate the vascular anatomy. RADIATION DOSE REDUCTION: This exam was performed according to the departmental dose-optimization program which includes automated exposure control, adjustment of the mA and/or kV according to patient size and/or use of iterative reconstruction technique. CONTRAST:  75mL OMNIPAQUE  IOHEXOL  350 MG/ML SOLN COMPARISON:  Chest x-ray 05/29/2024, chest CT 03/03/2024 FINDINGS: Cardiovascular: Satisfactory opacification of the pulmonary arteries to the segmental level. No evidence of pulmonary embolism. Nonaneurysmal aorta. No dissection. Multi vessel coronary vascular calcification.  Borderline cardiomegaly. No significant pericardial effusion Mediastinum/Nodes: Patent trachea. No thyroid  mass. No suspicious lymph nodes. Esophagus within normal limits. Lungs/Pleura: No acute airspace disease, pleural effusion, or pneumothorax Upper Abdomen: No acute finding Musculoskeletal: No acute osseous abnormality Review of the MIP images confirms the above findings. IMPRESSION: 1. Negative for acute pulmonary embolus or aortic dissection. 2. Borderline to mild cardiomegaly with multi vessel coronary vascular calcification. Electronically Signed   By: Luke Bun M.D.   On: 05/29/2024 16:50   DG Chest 2 View Result Date: 05/29/2024 CLINICAL DATA:  Chest pain. EXAM: CHEST - 2 VIEW COMPARISON:  04/17/2024. FINDINGS: The heart size and mediastinal contours are within unchanged. No focal consolidation, pleural effusion, or pneumothorax. Levocurvature of the thoracic spine. No acute osseous abnormality. IMPRESSION: No acute cardiopulmonary findings. Electronically Signed   By: Harrietta Sherry M.D.   On: 05/29/2024 15:06    Cardiac Studies   Patient Profile   53 y.o. female with hypertension, hyperlipidemia, CAD, recent PCI to mid RCA and mid left circumflex in 04/2024, h/o SVT, deafness, now with recurrent chest pain   Assessment & Plan   Chest pain: Cath 04/20/2024 showed diffuse mid LAD disease up to 50 to 60%, 90% distal LCx and sequential 60 to 80% proximal/mid RCA stenosis.  On 8/6 underwent DES to proximal/mid RCA and DES to mid/distal LCx.  Now presenting with recurrent chest pain.  Troponins negative.   -Continue aspirin  81 mg daily, prasugrel  10 mg daily -Continue Toprol -XL 50 mg daily -As needed sublingual nitroglycerin  -Continue rosuvastatin  20 mg daily -Follow-up echocardiogram -Dr. Elmira discussed options with patient on 9/12 and plan for LHC on 9/15. Risks and benefits of cardiac catheterization have been discussed with the patient.  These include bleeding, infection,  kidney damage, stroke, heart attack, death.  The patient understands these risks and is willing to proceed.  Hypertension: On amlodipine  5 mg daily and Toprol -XL 50 mg daily  Hyperlipidemia: On rosuvastatin  20 mg daily  For questions or updates, please contact [facility] Please consult [email or web address] for contact info under       Signed, Lonni LITTIE Nanas, MD  05/31/2024, 9:48 AM

## 2024-05-31 NOTE — Plan of Care (Signed)
   Problem: Clinical Measurements: Goal: Ability to maintain clinical measurements within normal limits will improve Outcome: Progressing   Problem: Activity: Goal: Risk for activity intolerance will decrease Outcome: Progressing   Problem: Nutrition: Goal: Adequate nutrition will be maintained Outcome: Progressing

## 2024-05-31 NOTE — Plan of Care (Signed)
  Problem: Health Behavior/Discharge Planning: Goal: Ability to manage health-related needs will improve Outcome: Progressing   Problem: Clinical Measurements: Goal: Respiratory complications will improve Outcome: Progressing Goal: Cardiovascular complication will be avoided Outcome: Progressing   Problem: Activity: Goal: Risk for activity intolerance will decrease Outcome: Progressing   Problem: Coping: Goal: Level of anxiety will decrease Outcome: Progressing   Problem: Safety: Goal: Ability to remain free from injury will improve Outcome: Progressing

## 2024-06-01 ENCOUNTER — Encounter: Payer: Self-pay | Admitting: Internal Medicine

## 2024-06-01 ENCOUNTER — Encounter (HOSPITAL_COMMUNITY)
Admission: EM | Disposition: A | Discharge status: Home / Self Care | Payer: Self-pay | Source: Home / Self Care | Attending: Infectious Diseases

## 2024-06-01 ENCOUNTER — Other Ambulatory Visit: Payer: Self-pay | Admitting: Internal Medicine

## 2024-06-01 ENCOUNTER — Encounter (HOSPITAL_COMMUNITY): Payer: Self-pay | Admitting: Internal Medicine

## 2024-06-01 DIAGNOSIS — G43719 Chronic migraine without aura, intractable, without status migrainosus: Secondary | ICD-10-CM

## 2024-06-01 DIAGNOSIS — R0789 Other chest pain: Secondary | ICD-10-CM

## 2024-06-01 DIAGNOSIS — M791 Myalgia, unspecified site: Secondary | ICD-10-CM | POA: Diagnosis not present

## 2024-06-01 DIAGNOSIS — E039 Hypothyroidism, unspecified: Secondary | ICD-10-CM | POA: Diagnosis not present

## 2024-06-01 DIAGNOSIS — N951 Menopausal and female climacteric states: Secondary | ICD-10-CM

## 2024-06-01 DIAGNOSIS — Z9861 Coronary angioplasty status: Secondary | ICD-10-CM

## 2024-06-01 DIAGNOSIS — I25111 Atherosclerotic heart disease of native coronary artery with angina pectoris with documented spasm: Secondary | ICD-10-CM | POA: Diagnosis not present

## 2024-06-01 DIAGNOSIS — Z8679 Personal history of other diseases of the circulatory system: Secondary | ICD-10-CM

## 2024-06-01 DIAGNOSIS — G43909 Migraine, unspecified, not intractable, without status migrainosus: Secondary | ICD-10-CM | POA: Diagnosis not present

## 2024-06-01 HISTORY — PX: LEFT HEART CATH AND CORONARY ANGIOGRAPHY: CATH118249

## 2024-06-01 HISTORY — PX: CORONARY PRESSURE/FFR STUDY: CATH118243

## 2024-06-01 LAB — CBC
HCT: 38.9 % (ref 36.0–46.0)
Hemoglobin: 13.1 g/dL (ref 12.0–15.0)
MCH: 30.7 pg (ref 26.0–34.0)
MCHC: 33.7 g/dL (ref 30.0–36.0)
MCV: 91.1 fL (ref 80.0–100.0)
Platelets: 249 K/uL (ref 150–400)
RBC: 4.27 MIL/uL (ref 3.87–5.11)
RDW: 11.6 % (ref 11.5–15.5)
WBC: 7.7 K/uL (ref 4.0–10.5)
nRBC: 0 % (ref 0.0–0.2)

## 2024-06-01 LAB — BASIC METABOLIC PANEL WITH GFR
Anion gap: 13 (ref 5–15)
BUN: 9 mg/dL (ref 6–20)
CO2: 24 mmol/L (ref 22–32)
Calcium: 9.5 mg/dL (ref 8.9–10.3)
Chloride: 104 mmol/L (ref 98–111)
Creatinine, Ser: 0.72 mg/dL (ref 0.44–1.00)
GFR, Estimated: >60 mL/min (ref >60)
Glucose, Bld: 84 mg/dL (ref 70–99)
Potassium: 4.5 mmol/L (ref 3.5–5.1)
Sodium: 141 mmol/L (ref 135–145)

## 2024-06-01 LAB — POCT ACTIVATED CLOTTING TIME
Activated Clotting Time: 291 s
Activated Clotting Time: 320 s

## 2024-06-01 SURGERY — LEFT HEART CATH AND CORONARY ANGIOGRAPHY
Anesthesia: LOCAL

## 2024-06-01 MED ORDER — LIDOCAINE HCL (PF) 1 % IJ SOLN
INTRAMUSCULAR | Status: DC | PRN
  Administered 2024-06-01: 2 mL via INTRADERMAL

## 2024-06-01 MED ORDER — AMLODIPINE BESYLATE 5 MG PO TABS: 5.0000 mg | ORAL_TABLET | Freq: Every day | ORAL | Status: DC

## 2024-06-01 MED ORDER — HEPARIN SODIUM (PORCINE) 1000 UNIT/ML IJ SOLN
INTRAMUSCULAR | Status: DC | PRN
  Administered 2024-06-01: 2000 [IU] via INTRAVENOUS
  Administered 2024-06-01: 4500 [IU] via INTRAVENOUS
  Administered 2024-06-01: 4000 [IU] via INTRAVENOUS

## 2024-06-01 MED ORDER — FENTANYL CITRATE (PF) 100 MCG/2ML IJ SOLN
INTRAMUSCULAR | Status: AC
  Filled 2024-06-01: qty 2

## 2024-06-01 MED ORDER — TRAMADOL HCL 50 MG PO TABS: 50.0000 mg | ORAL_TABLET | Freq: Four times a day (QID) | ORAL | 0 refills | Status: DC | PRN

## 2024-06-01 MED ORDER — AMLODIPINE BESYLATE 2.5 MG PO TABS
2.5000 mg | ORAL_TABLET | Freq: Every day | ORAL | Status: DC
  Administered 2024-06-01: 2.5 mg via ORAL
  Filled 2024-06-01: qty 1

## 2024-06-01 MED ORDER — VERAPAMIL HCL 2.5 MG/ML IV SOLN
INTRAVENOUS | Status: AC
Start: 2024-06-01 — End: 2024-06-01
  Filled 2024-06-01: qty 2

## 2024-06-01 MED ORDER — LIDOCAINE HCL (PF) 1 % IJ SOLN
INTRAMUSCULAR | Status: AC
  Filled 2024-06-01: qty 30

## 2024-06-01 MED ORDER — SODIUM CHLORIDE 0.9% FLUSH
3.0000 mL | Freq: Two times a day (BID) | INTRAVENOUS | Status: DC
  Administered 2024-06-01 – 2024-06-02 (×2): 3 mL via INTRAVENOUS

## 2024-06-01 MED ORDER — NITROGLYCERIN 1 MG/10 ML FOR IR/CATH LAB
INTRA_ARTERIAL | Status: AC
  Filled 2024-06-01: qty 10

## 2024-06-01 MED ORDER — ADENOSINE 12 MG/4ML IV SOLN
INTRAVENOUS | Status: AC
  Filled 2024-06-01: qty 16

## 2024-06-01 MED ORDER — METOPROLOL SUCCINATE ER 25 MG PO TB24
25.0000 mg | ORAL_TABLET | Freq: Every day | ORAL | Status: DC
Start: 2024-06-02 — End: 2024-06-02

## 2024-06-01 MED ORDER — SODIUM CHLORIDE 0.9 % IV SOLN: INTRAVENOUS | Status: AC

## 2024-06-01 MED ORDER — SODIUM CHLORIDE 0.9 % IV SOLN: 250.0000 mL | INTRAVENOUS | Status: AC | PRN

## 2024-06-01 MED ORDER — SODIUM CHLORIDE 0.9% FLUSH: 3.0000 mL | INTRAVENOUS | Status: DC | PRN

## 2024-06-01 MED ORDER — NITROGLYCERIN 0.4 MG SL SUBL
SUBLINGUAL_TABLET | SUBLINGUAL | Status: DC | PRN
  Administered 2024-06-01: .4 mg via SUBLINGUAL

## 2024-06-01 MED ORDER — HEPARIN SODIUM (PORCINE) 1000 UNIT/ML IJ SOLN
INTRAMUSCULAR | Status: AC
  Filled 2024-06-01: qty 10

## 2024-06-01 MED ORDER — NITROGLYCERIN 0.4 MG SL SUBL
SUBLINGUAL_TABLET | SUBLINGUAL | Status: AC
  Filled 2024-06-01: qty 1

## 2024-06-01 MED ORDER — ADENOSINE (DIAGNOSTIC) 140MCG/KG/MIN
INTRAVENOUS | Status: DC | PRN
  Administered 2024-06-01: 140 ug/kg/min via INTRAVENOUS

## 2024-06-01 MED ORDER — FENTANYL CITRATE (PF) 100 MCG/2ML IJ SOLN
INTRAMUSCULAR | Status: DC | PRN
  Administered 2024-06-01: 50 ug via INTRAVENOUS
  Administered 2024-06-01 (×2): 25 ug via INTRAVENOUS

## 2024-06-01 MED ORDER — NITROGLYCERIN 2 % TD OINT
0.5000 [in_us] | TOPICAL_OINTMENT | Freq: Four times a day (QID) | TRANSDERMAL | Status: DC
Start: 2024-06-01 — End: 2024-06-01

## 2024-06-01 MED ORDER — LIDOCAINE 5 % EX PTCH
1.0000 | MEDICATED_PATCH | CUTANEOUS | Status: DC
  Administered 2024-06-01 – 2024-06-02 (×2): 1 via TRANSDERMAL
  Filled 2024-06-01 (×2): qty 1

## 2024-06-01 MED ORDER — IOHEXOL 350 MG/ML SOLN
INTRAVENOUS | Status: DC | PRN
  Administered 2024-06-01: 55 mL

## 2024-06-01 MED ORDER — SODIUM CHLORIDE 0.9 % IV BOLUS
INTRAVENOUS | Status: AC | PRN
  Administered 2024-06-01: 250 mL via INTRAVENOUS

## 2024-06-01 MED ORDER — MIDAZOLAM HCL 2 MG/2ML IJ SOLN
INTRAMUSCULAR | Status: AC
  Filled 2024-06-01: qty 2

## 2024-06-01 MED ORDER — HEPARIN (PORCINE) IN NACL 1000-0.9 UT/500ML-% IV SOLN
INTRAVENOUS | Status: DC | PRN
  Administered 2024-06-01 (×3): 500 mL

## 2024-06-01 MED ORDER — VERAPAMIL HCL 2.5 MG/ML IV SOLN
INTRAVENOUS | Status: DC | PRN
  Administered 2024-06-01 (×2): 10 mL via INTRA_ARTERIAL

## 2024-06-01 MED ORDER — HYDROMORPHONE HCL 1 MG/ML IJ SOLN
0.5000 mg | INTRAMUSCULAR | Status: DC | PRN
  Administered 2024-06-01 – 2024-06-02 (×3): 0.5 mg via INTRAVENOUS
  Filled 2024-06-01 (×4): qty 1

## 2024-06-01 MED ORDER — ENOXAPARIN SODIUM 40 MG/0.4ML IJ SOSY
40.0000 mg | PREFILLED_SYRINGE | INTRAMUSCULAR | Status: DC
  Administered 2024-06-02: 40 mg via SUBCUTANEOUS
  Filled 2024-06-01: qty 0.4

## 2024-06-01 MED ORDER — HYDRALAZINE HCL 20 MG/ML IJ SOLN: 10.0000 mg | INTRAMUSCULAR | Status: AC | PRN

## 2024-06-01 MED ORDER — NITROGLYCERIN 1 MG/10 ML FOR IR/CATH LAB
INTRA_ARTERIAL | Status: DC | PRN
  Administered 2024-06-01: 200 ug via INTRACORONARY
  Administered 2024-06-01 (×2): 100 ug via INTRACORONARY

## 2024-06-01 MED ORDER — MIDAZOLAM HCL 2 MG/2ML IJ SOLN
INTRAMUSCULAR | Status: DC | PRN
  Administered 2024-06-01 (×2): 1 mg via INTRAVENOUS

## 2024-06-01 SURGICAL SUPPLY — 12 items
CATH 5FR JL3.5 JR4 ANG PIG MP (CATHETERS) IMPLANT
CATH LAUNCHER 6FR EBU3.5 (CATHETERS) IMPLANT
DEVICE RAD COMP TR BAND LRG (VASCULAR PRODUCTS) IMPLANT
GLIDESHEATH SLEND SS 6F .021 (SHEATH) IMPLANT
GUIDEWIRE INQWIRE 1.5J.035X260 (WIRE) IMPLANT
GUIDEWIRE PRESSURE X 175 (WIRE) IMPLANT
KIT ESSENTIALS PG (KITS) IMPLANT
PACK CARDIAC CATHETERIZATION (CUSTOM PROCEDURE TRAY) ×1 IMPLANT
SET ATX-X65L (MISCELLANEOUS) IMPLANT
SHEATH GLIDE SLENDER 4/5FR (SHEATH) IMPLANT
SHEATH PROBE COVER 6X72 (BAG) IMPLANT
WIRE HI TORQ VERSACORE-J 145CM (WIRE) IMPLANT

## 2024-06-01 NOTE — Progress Notes (Signed)
 HD#3 SUBJECTIVE:  Patient Summary: Donna Davis is a 53 y.o. with a pertinent PMH of CAD s/p DES 04/22/2024, HTN, HLD, Hypothyroidism, GERD, GAD and deafness who presented with chest pain and admitted for unstable angina.   Overnight Events: NAEON   Interim History:  States that her chest pain is worse after her catheterization. She says that it is constant 10/10 pain around her mid-chest and wrapping around to the left side behind her back. She is very anxious and frustrated about her pain.   [employer] Video interpreter used during evaluation today.  OBJECTIVE:  Vital Signs: Vitals:   06/01/24 1104 06/01/24 1120 06/01/24 1133 06/01/24 1233  BP:  108/71 113/73 112/69  Pulse: (!) 0 60 64 (!) 59  Resp:   16 19  Temp:   98 F (36.7 C)   TempSrc:   Oral   SpO2:  99% 99% 98%  Weight:      Height:        Filed Weights   05/29/24 1350  Weight: 83.4 kg     Intake/Output Summary (Last 24 hours) at 06/01/2024 1443 Last data filed at 06/01/2024 0510 Gross per 24 hour  Intake 500 ml  Output --  Net 500 ml   Net IO Since Admission: 790 mL [06/01/24 1443]  Physical Exam: Constitutional: well-appearing, obese, in no acute distress HENT: normocephalic atraumatic, mucous membranes moist; deaf Eyes: conjunctiva non-erythematous, PERRL, no scleral icterus Cardiovascular: regular rate and rhythm, no m/r/g Pulmonary/Chest: normal work of breathing on room air, anterior lung fields CTAB Abdominal: soft, non-tender, non-distended, bowel sounds normal MSK: normal bulk and tone Neurological: alert & oriented x3 Skin: warm and dry Extremities: right radial catheter site with minimal bleeding but no overlying swelling, no cyanosis; peripheral pulses intact Psych: anxious mood and affect, thought content normal  Patient Lines/Drains/Airways Status     Active Line/Drains/Airways     Name Placement date Placement time Site Days   Peripheral IV 05/29/24 20 G Left Antecubital 05/29/24  1523   Antecubital  1            Pertinent labs and imaging:     Latest Ref Rng & Units 06/01/2024    5:38 AM 05/30/2024    4:32 AM 05/29/2024    2:08 PM  CBC  WBC 4.0 - 10.5 K/uL 7.7  7.5    Hemoglobin 12.0 - 15.0 g/dL 86.8  87.2  86.3   Hematocrit 36.0 - 46.0 % 38.9  37.0  40.0   Platelets 150 - 400 K/uL 249  227         Latest Ref Rng & Units 06/01/2024    5:38 AM 05/30/2024    4:32 AM 05/29/2024    2:08 PM  CMP  Glucose 70 - 99 mg/dL 84  889  91   BUN 6 - 20 mg/dL 9  7  17    Creatinine 0.44 - 1.00 mg/dL 9.27  9.22  9.29   Sodium 135 - 145 mmol/L 141  136  135   Potassium 3.5 - 5.1 mmol/L 4.5  3.5  4.4   Chloride 98 - 111 mmol/L 104  103  103   CO2 22 - 32 mmol/L 24  23    Calcium  8.9 - 10.3 mg/dL 9.5  9.0      No results found.   ASSESSMENT/PLAN:  Assessment: Principal Problem:   Angina at rest [facility]) Active Problems:   Hypothyroidism   Anxiety state   GERD   Bilateral deafness  Palpitations   CAD (coronary artery disease)   Primary hypertension   Plan: #Vasospastic Angina #CAD s/p DES Today patient still reporting chest pain present and worse after catheterization.  Status post DES on 8/6 to proximal/mid RCA and mid/distal LCx.  Also had a negative echo stress test for ischemia on 04/17/2024. Troponins are negative.  EKG on admission without ST/T wave changes.  Updated TTE with normal LVEF and function.  Cardiology is following this patient. Cath today with stable appearance of coronary arteries and previously placed stents are patent. Transient vasospasm noted to distal RCA. Muscle aches may be caused by Crestor  so we will hold. Will also increase amlodipine  to 5 mg daily. Will consider starting ranolazine but following cardiology recommendations.  - Continue Aspirin  81 mg daily - Continue prasugrel  10 mg daily - Continue Toprol -XL 50 mg daily - Increase amlodipine  to 5mg  daily - Hold rosuvastatin  20 mg daily - Sublingual nitroglycerin  as needed - IV Dilaudid   0.5mg  q4h prn for pain control - Continuous cardiac monitoring  #Migraine Headaches Patient reporting worse headache after taking nitroglycerin  but notes a prior history of migraines. States that Zofran  is a headache trigger for her.  - HA cocktail with IV Benadryl  12.5 mg/IV Compazine  10 mg  - Phenergan  12.5 mg every 6 hours as needed for nausea/vomiting - Tramadol  50 mg every 6 hours as needed for severe pain  #Hypothyroidism #Myalgias #Fatigue Patient reporting myalgias and fatigue immediately prior to admission.  Also having increased need for sleep.  She did recently start metoprolol  XL 50 mg and noted with prior propranolol  use that she was fatigued as well.  Also recently stopped estradiol  due to contraindication with CAD.  Prior TSH 2 months ago was 2.370 but recheck during this admission mildly elevated at 4.634. CK wnl.  Etiology of symptoms likely multifactorial in the setting of medication adverse effect and vasomotor symptoms from menopause.  Will continue home Synthroid  and consider increasing dose. - Continue Synthroid  25 mcg daily - Outpatient follow-up with OB/GYN for menopause  #Anxiety Per chart review, the patient has a history of anxiety with current medications as BuSpar  daily and Klonopin  as needed.  Her anxiety may be a significant contributor to her symptoms of chest pain, but do not doubt cardiac nature of her pain.  She has previously tried SSRI/SNRIs but did not tolerate them. We will continue her home medications and further adjustments can be made in the outpatient setting. - PO BuSpar  20 mg 3 times daily - PO Klonopin  0.5 mg nightly as needed for anxiety  #History of hypertension She has been mostly normotensive with episodes of hypotension during this hospitalization.  Will discontinue amlodipine  due to low pressures. - Discontinue amlodipine   #Hyperlipidemia Last LDL of 109 which is not at goal.  Will continue home medications and follow further guidance  from cardiology.  She can follow-up in the outpatient setting. - Hold rosuvastatin  20 mg daily  #GERD - Continue home pantoprazole  40 mg daily  Best Practice: Diet: Heart-Healthy IVF: Fluids: None, Rate: None VTE: enoxaparin  (LOVENOX ) injection 40 mg Start: 06/02/24 0800 Code: Full  Disposition planning: Therapy Recs: Pending, DME: none Family Contact: Reyes Johann (Husband), to be notified. DISPO: Anticipated discharge to Home in 1-2 days pending pain control.  Signature:  Letha Cheadle, MD [facility]  PGY-1 06/01/2024, 2:43 PM  On Call pager [phone]

## 2024-06-01 NOTE — Progress Notes (Signed)
 Medication due at 0600 not available in pyxis. Pharmacy called and stated they would send up medication for administration. Medication due time changed to 0700 pending arrival from pharmacy.

## 2024-06-01 NOTE — Interval H&P Note (Signed)
 History and Physical Interval Note:  06/01/2024 9:07 AM  Donna Davis  has presented today for surgery, with the diagnosis of unstable angina.  The various methods of treatment have been discussed with the patient and family. After consideration of risks, benefits and other options for treatment, the patient has consented to  Procedure(s): LEFT HEART CATH AND CORONARY ANGIOGRAPHY (N/A) as a surgical intervention.  The patient's history has been reviewed, patient examined, no change in status, stable for surgery.  I have reviewed the patient's chart and labs.  Questions were answered to the patient's satisfaction.    Cath Lab Visit (complete for each Cath Lab visit)  Clinical Evaluation Leading to the Procedure:   ACS: Yes.  (Unstable angina)  Non-ACS:  N/A  [name]

## 2024-06-01 NOTE — Plan of Care (Signed)
  Problem: Education: Goal: Knowledge of General Education information will improve Description: Including pain rating scale, medication(s)/side effects and non-pharmacologic comfort measures Outcome: Progressing   Problem: Health Behavior/Discharge Planning: Goal: Ability to manage health-related needs will improve Outcome: Progressing   Problem: Clinical Measurements: Goal: Ability to maintain clinical measurements within normal limits will improve Outcome: Progressing Goal: Will remain free from infection Outcome: Progressing Goal: Diagnostic test results will improve Outcome: Progressing Goal: Cardiovascular complication will be avoided Outcome: Progressing   Problem: Activity: Goal: Risk for activity intolerance will decrease Outcome: Progressing   Problem: Nutrition: Goal: Adequate nutrition will be maintained Outcome: Progressing   Problem: Coping: Goal: Level of anxiety will decrease Outcome: Not Met (add Reason) Note: On-going

## 2024-06-01 NOTE — Progress Notes (Addendum)
 Patient has been having chest pain since returning from the cardiac catheterization lab.  Her husband is sitting at the bedside.    Patient seen and evaluated by me, patient complains of chest pain when she takes a deep breath, also pain when I made her sit up to auscultate her back.  Physical examination is unremarkable.  EKG essentially normal post cardiac catheterization. Widely patent prior stent and LAD FFR negative for significant disease.   I reassured her.  Will use Dilaudid  for pain control as fentanyl  did not help.  No suggestion of any dissection or complication from the procedure.  Right forearm no hematoma.  Will continue to observe.  Sign language utilized for conversing with the patient, patient's husband is also present at the bedside.    CAD with chronic stable angina Post PTCA status, history of PTCA and stenting to mid RCA and mid to distal Cx on 04/22/2024.  Widely patent stents. Musculoskeletal chest pain.  CT angiogram chest negative for PE. Primary hypertension    Blood pressure 112/69, pulse (!) 59, temperature 98 F (36.7 C), temperature source Oral, resp. rate 19, height 5' (1.524 m), weight 83.4 kg, last menstrual period 12/26/2004, SpO2 98%.  Physical Exam Neck:     Vascular: No carotid bruit or JVD.  Cardiovascular:     Rate and Rhythm: Normal rate and regular rhythm.     Pulses: Intact distal pulses.     Heart sounds: Normal heart sounds. No murmur heard.    No gallop.  Pulmonary:     Effort: Pulmonary effort is normal.     Breath sounds: Normal breath sounds.  Abdominal:     General: Bowel sounds are normal.     Palpations: Abdomen is soft.  Musculoskeletal:     Right lower leg: No edema.     Left lower leg: No edema.      Gordy Bergamo, MD, [facility] 06/01/2024, 2:18 PM [facility] [address] [place], [place] [address] Phone: [phone]. Fax:  [phone]

## 2024-06-01 NOTE — TOC Initial Note (Signed)
 Transition of Care [facility]) - Initial/Assessment Note    Patient Details  Name: Donna Davis MRN: [id number] Date of Birth: [birth year]  [facility] CM/SW Contact:    Sudie Erminio Deems, RN Phone Number: 06/01/2024, 4:06 PM  Clinical Narrative:  Patient presented for chest pain. PTA patient was from home with spouse. Spouse was at the bedside during the visit. Video Interpreter utilized to sign for patient ID # [id number]. Patient states PCP is Internal Medicine. Patient asks if her medications can be changed to [facility]. Inpatient Case Manager did provide the number on the AVS and made the attending service aware of request. ICM will make the unit Pharmacist aware in am just in case they can change the pharmacy as well. Patient does not use any DME in the home and she states she has transportation to appointments. No home needs identified during the visit.                  Expected Discharge Plan: Home/Self Care Barriers to Discharge: No Barriers Identified   Patient Goals and CMS Choice Patient states their goals for this hospitalization and ongoing recovery are:: plan to return home   Choice offered to / list presented to : NA      Expected Discharge Plan and Services In-house Referral: NA Discharge Planning Services: CM Consult Post Acute Care Choice: NA Living arrangements for the past 2 months: Single Family Home                   DME Agency: NA       HH Arranged: NA          Prior Living Arrangements/Services Living arrangements for the past 2 months: Single Family Home Lives with:: Spouse Patient language and need for interpreter reviewed:: Yes (Video Interpreter# [id number]) Do you feel safe going back to the place where you live?: Yes      Need for Family Participation in Patient Care: Yes (Comment) Care giver support system in place?: Yes (comment)   Criminal Activity/Legal Involvement Pertinent to Current  Situation/Hospitalization: No - Comment as needed  Activities of Daily Living   ADL Screening (condition at time of admission) Independently performs ADLs?: Yes (appropriate for developmental age) Is the patient deaf or have difficulty hearing?: Yes Does the patient have difficulty seeing, even when wearing glasses/contacts?: No Does the patient have difficulty concentrating, remembering, or making decisions?: No  Permission Sought/Granted Permission sought to share information with : Case Manager, Family Supports                Emotional Assessment Appearance:: Appears stated age Attitude/Demeanor/Rapport: Engaged Affect (typically observed): Appropriate Orientation: : Oriented to Self, Oriented to Place, Oriented to  Time, Oriented to Situation Alcohol / Substance Use: Not Applicable Psych Involvement: No (comment)  Admission diagnosis:  Angina at rest [facility]) [I20.89] Chest pain [R07.9] Coronary artery disease involving native coronary artery of native heart with angina pectoris (HCC) [I25.119] Patient Active Problem List   Diagnosis Date Noted   Primary hypertension 05/29/2024   CAD (coronary artery disease) 04/23/2024   Unstable angina (HCC) 04/20/2024   Syncope and collapse 04/14/2024   Angina at rest [facility]) 04/14/2024   Anxiety    Clostridium difficile infection    Deaf    Family history of malignant neoplasm of gastrointestinal tract    Gallstones    Hearing difficulty    Hepatic cyst    Obesity    PONV (postoperative nausea  and vomiting)    On hormone replacement therapy 04/07/2024   Hyperglycemia 04/07/2024   Elevated LDL cholesterol level 04/07/2024   Screening mammogram for breast cancer 04/07/2024   Cardiac murmur 03/19/2024   Chest pain of uncertain etiology 03/19/2024   Frequent PVCs 03/19/2024   Palpitations 03/05/2024   Screening for colon cancer 10/22/2023   Need for Tdap vaccination 10/22/2023   Need for pneumococcal vaccine 10/22/2023   Low  back pain 10/09/2022   Pain in thoracic spine 10/09/2022   Arthralgia of right temporomandibular joint 08/26/2018   Allergic rhinitis due to allergen 12/19/2016   Bilateral deafness 01/17/2016   Family history of breast cancer in mother 08/26/2015   Obesity (BMI 35.0-39.9 without comorbidity) 02/18/2014   Migraine 12/02/2012   Routine adult health maintenance 09/01/2012   Anxiety state 09/03/2006   Hypothyroidism 07/01/2006   GERD 07/01/2006   IBS 07/01/2006   PCP:  [name], MD Pharmacy:   [id number] [place], [place] - [address] [place] [address] Phone: [phone] Fax: [phone]  [facility] [address] [place] [address] Phone: [phone] Fax: [phone]  [facility] [address] [place] [address] Phone: [phone] Fax: [phone]     Social Drivers of Health (SDOH) Social History: SDOH Screenings   Food Insecurity: No Food Insecurity (05/30/2024)  Housing: Low Risk  (05/30/2024)  Transportation Needs: No Transportation Needs (05/30/2024)  Utilities: Not At Risk (05/30/2024)  Depression (PHQ2-9): Low Risk  (04/30/2024)  Tobacco Use: Medium Risk (05/30/2024)   SDOH Interventions:     Readmission Risk Interventions    04/23/2024   11:00 AM  Readmission Risk Prevention Plan  Post Dischage Appt Complete  Medication Screening Complete  Transportation Screening Complete

## 2024-06-01 NOTE — Plan of Care (Signed)

## 2024-06-01 NOTE — Progress Notes (Signed)
 Patient expressed concern on timing of cardiac cath due to assigned interpreter set to arrive at 0830 this morning and set to leave at 1115 this morning. Patient expressed severe anxiety over interpreter not being present if cardiac cath is completed outside of this window. Dr. Duffy made aware via secure chat of patient's concerns and stated he would pass on information to scheduler of cardiac cath.   Patient denied any additional concerns this shift.   Safety measures in place.

## 2024-06-01 NOTE — Brief Op Note (Signed)
 BRIEF CARDIAC CATHETERIZATION NOTE  06/01/2024  11:09 AM  PATIENT:  Donna Davis  53 y.o. female  PRE-OPERATIVE DIAGNOSIS:  Unstable angina  POST-OPERATIVE DIAGNOSIS:  Same  PROCEDURE:  Procedure(s): LEFT HEART CATH AND CORONARY ANGIOGRAPHY (N/A) CORONARY PRESSURE/FFR STUDY (N/A)  SURGEON:  Surgeons and Role:    * Desiree Daise, MD - Primary  FINDINGS: Stable appearance of coronary arteries following PCI to RCA and LCx last month.  There is diffuse mid/distal LAD disease of 50-60% (RFR = 0.90, FFR 0.75).  LCx and RCA stents are widely patent. Transient vasospasm of distal RCA that resolves with IC NTG. Normal CFR/IMR. Normal LVEDP.  RECOMMENDATIONS: Escalate antianginal medical therapy.  Defer PCI to LAD versus single-vessel CABG for severe refractory symptoms. Significant chest pain noted during and at Krikor Willet of catheterization with normal EKG and stable angiographic findings; consider evaluation for alternative therapies.  Lonni Hanson, MD [facility]

## 2024-06-02 ENCOUNTER — Other Ambulatory Visit (HOSPITAL_COMMUNITY): Payer: Self-pay

## 2024-06-02 DIAGNOSIS — G720 Drug-induced myopathy: Secondary | ICD-10-CM | POA: Diagnosis not present

## 2024-06-02 DIAGNOSIS — I25111 Atherosclerotic heart disease of native coronary artery with angina pectoris with documented spasm: Secondary | ICD-10-CM | POA: Diagnosis not present

## 2024-06-02 DIAGNOSIS — R5383 Other fatigue: Secondary | ICD-10-CM | POA: Diagnosis not present

## 2024-06-02 LAB — BASIC METABOLIC PANEL WITH GFR
Anion gap: 10 (ref 5–15)
BUN: 8 mg/dL (ref 6–20)
CO2: 20 mmol/L — ABNORMAL LOW (ref 22–32)
Calcium: 9.4 mg/dL (ref 8.9–10.3)
Chloride: 105 mmol/L (ref 98–111)
Creatinine, Ser: 0.87 mg/dL (ref 0.44–1.00)
GFR, Estimated: >60 mL/min (ref >60)
Glucose, Bld: 84 mg/dL (ref 70–99)
Potassium: 3.6 mmol/L (ref 3.5–5.1)
Sodium: 135 mmol/L (ref 135–145)

## 2024-06-02 LAB — CBC
HCT: 37.5 % (ref 36.0–46.0)
Hemoglobin: 12.7 g/dL (ref 12.0–15.0)
MCH: 30.5 pg (ref 26.0–34.0)
MCHC: 33.9 g/dL (ref 30.0–36.0)
MCV: 89.9 fL (ref 80.0–100.0)
Platelets: 234 K/uL (ref 150–400)
RBC: 4.17 MIL/uL (ref 3.87–5.11)
RDW: 11.7 % (ref 11.5–15.5)
WBC: 7 K/uL (ref 4.0–10.5)
nRBC: 0 % (ref 0.0–0.2)

## 2024-06-02 MED ORDER — METOPROLOL SUCCINATE ER 25 MG PO TB24
12.5000 mg | ORAL_TABLET | Freq: Every day | ORAL | 0 refills | Status: DC
  Filled 2024-06-02 – 2024-06-03 (×2): qty 15, 30d supply, fill #0

## 2024-06-02 MED ORDER — METOPROLOL SUCCINATE ER 25 MG PO TB24
12.5000 mg | ORAL_TABLET | Freq: Every day | ORAL | Status: DC
Start: 2024-06-02 — End: 2024-06-02
  Administered 2024-06-02: 12.5 mg via ORAL
  Filled 2024-06-02: qty 1

## 2024-06-02 MED ORDER — LIDOCAINE 5 % EX PTCH
1.0000 | MEDICATED_PATCH | CUTANEOUS | 0 refills | Status: DC
  Filled 2024-06-02 (×2): qty 30, 30d supply, fill #0

## 2024-06-02 MED ORDER — AMLODIPINE BESYLATE 2.5 MG PO TABS
2.5000 mg | ORAL_TABLET | Freq: Every day | ORAL | Status: DC
  Administered 2024-06-02: 2.5 mg via ORAL
  Filled 2024-06-02: qty 1

## 2024-06-02 MED ORDER — AMLODIPINE BESYLATE 2.5 MG PO TABS
2.5000 mg | ORAL_TABLET | Freq: Every day | ORAL | 0 refills | Status: DC
  Filled 2024-06-02: qty 30, 30d supply, fill #0

## 2024-06-02 NOTE — Plan of Care (Signed)
  Problem: Education: Goal: Knowledge of General Education information will improve Description: Including pain rating scale, medication(s)/side effects and non-pharmacologic comfort measures 06/02/2024 0351 by Savannah Morford C, RN Outcome: Progressing 06/02/2024 0349 by Karolee Drucella BROCKS, RN Outcome: Progressing   Problem: Health Behavior/Discharge Planning: Goal: Ability to manage health-related needs will improve 06/02/2024 0351 by Ronell Duffus C, RN Outcome: Progressing 06/02/2024 0349 by Karolee Drucella BROCKS, RN Outcome: Progressing   Problem: Clinical Measurements: Goal: Ability to maintain clinical measurements within normal limits will improve 06/02/2024 0351 by Johonna Binette C, RN Outcome: Progressing 06/02/2024 0349 by Kamee Bobst C, RN Outcome: Progressing Goal: Diagnostic test results will improve 06/02/2024 0351 by Katyra Tomassetti C, RN Outcome: Progressing 06/02/2024 0349 by Huong Luthi C, RN Outcome: Progressing Goal: Respiratory complications will improve Outcome: Progressing Goal: Cardiovascular complication will be avoided Outcome: Progressing   Problem: Activity: Goal: Risk for activity intolerance will decrease 06/02/2024 0351 by Tasmine Hipwell C, RN Outcome: Progressing 06/02/2024 0349 by Karolee Drucella BROCKS, RN Outcome: Progressing   Problem: Nutrition: Goal: Adequate nutrition will be maintained 06/02/2024 0351 by Marion Rosenberry C, RN Outcome: Progressing 06/02/2024 0349 by Karolee Drucella BROCKS, RN Outcome: Progressing   Problem: Coping: Goal: Level of anxiety will decrease Outcome: Progressing   Problem: Safety: Goal: Ability to remain free from injury will improve Outcome: Progressing

## 2024-06-02 NOTE — Progress Notes (Signed)
  Progress Note  Patient Name: Donna Davis Date of Encounter: 06/02/2024 [facility] Cardiologist: Ozell Fell, MD   Interval Summary    Translator [phone] used  Had some recurrent chest pain overnight, but much improved this morning. Husband at the bedside.   Vital Signs Vitals:   06/01/24 2005 06/02/24 0056 06/02/24 0543 06/02/24 0544  BP: 104/65 103/65 (!) 93/53 (!) 93/56  Pulse:  74 74 70  Resp: 18 18 19    Temp: 98.3 F (36.8 C) 98 F (36.7 C) 98.4 F (36.9 C)   TempSrc: Oral Oral Oral   SpO2: 100% 100% 99% 99%  Weight:      Height:        Intake/Output Summary (Last 24 hours) at 06/02/2024 0819 Last data filed at 06/01/2024 1800 Gross per 24 hour  Intake 591.87 ml  Output --  Net 591.87 ml      05/29/2024    1:50 PM 05/13/2024    9:00 AM 05/01/2024    5:59 PM  Last 3 Weights  Weight (lbs) 183 lb 13.8 oz 183 lb 12.8 oz 185 lb 6.5 oz  Weight (kg) 83.4 kg 83.371 kg 84.1 kg      Telemetry/ECG   Sinus Rhythm - Personally Reviewed  Physical Exam  GEN: No acute distress.   Neck: No JVD Cardiac: RRR, no murmurs, rubs, or gallops.  Respiratory: Clear to auscultation bilaterally. GI: Soft, nontender, non-distended  MS: No edema  Assessment & Plan   53 y.o. female with hypertension, hyperlipidemia, CAD, recent PCI to mid RCA and mid left circumflex in 04/2024, h/o SVT, deafness, now with recurrent chest pain.  Chest pain CAD s/p PCI to mid RCA and circumflex 04/2024 Vasospasm -- Presented back with recurrent chest pain, negative troponins -- Underwent cardiac cath 9/15 with stable appearance of coronary arteries with recent PCI of circumflex and RCA with widely patent stents.  Long segment of 50 to 60% stenosis of the mid/distal LAD appeared unchanged with RFR 0.90 and FFR 0.75, normal microvascular function with coronary vasospasm of the RCA noted resolving with intracoronary nitroglycerin .  -- Recommendations to add amlodipine  for coronary  vasospasm as she has been intolerant to long-acting nitroglycerin  previously.   -- On aspirin , Effient . Reports issues with fatigue and myalgias since discharge. Will reduce Toprol  to 12.5mg  daily, and norvasc  2.5mg  daily. Could consider Ranexa in the future if needed. Also spoke about possible esophageal spasms? May be beneficial to see GI outpatient for further evaluation.   Hypertension -- BP soft this morning, as above will reduce Toprol  12.5mg  daily and norvasc  2.5mg  daily  Hyperlipidemia -- Was on Crestor  20 mg daily, this is stopped yesterday in the setting of reported myalgias -- Plan to refer to lipid clinic  For questions or updates, please contact  [facility] Please consult [email or web address] for contact info under   Signed, Manuelita Rummer, NP

## 2024-06-02 NOTE — Progress Notes (Incomplete)
 HD#4 SUBJECTIVE:  Patient Summary: Donna Davis is a 53 y.o. with a pertinent PMH of CAD s/p DES 04/22/2024, HTN, HLD, Hypothyroidism, GERD, GAD and deafness who presented with chest pain and admitted for unstable angina.   Overnight Events: NAEON   Interim History:  ***  ASL Video interpreter used during evaluation today.  OBJECTIVE:  Vital Signs: Vitals:   06/01/24 2005 06/02/24 0056 06/02/24 0543 06/02/24 0544  BP: 104/65 103/65 (!) 93/53 (!) 93/56  Pulse:  74 74 70  Resp: 18 18 19    Temp: 98.3 F (36.8 C) 98 F (36.7 C) 98.4 F (36.9 C)   TempSrc: Oral Oral Oral   SpO2: 100% 100% 99% 99%  Weight:      Height:        Filed Weights   05/29/24 1350  Weight: 83.4 kg     Intake/Output Summary (Last 24 hours) at 06/02/2024 0653 Last data filed at 06/01/2024 1800 Gross per 24 hour  Intake 591.87 ml  Output --  Net 591.87 ml   Net IO Since Admission: 1,381.87 mL [06/02/24 0653]  Physical Exam: Constitutional: well-appearing, obese, in no acute distress HENT: normocephalic atraumatic, mucous membranes moist; deaf Eyes: conjunctiva non-erythematous, PERRL, no scleral icterus Cardiovascular: regular rate and rhythm, no m/r/g Pulmonary/Chest: normal work of breathing on room air, anterior lung fields CTAB Abdominal: soft, non-tender, non-distended, bowel sounds normal MSK: normal bulk and tone Neurological: alert & oriented x3 Skin: warm and dry Extremities: right radial catheter site with minimal bleeding but no overlying swelling, no cyanosis; peripheral pulses intact Psych: anxious mood and affect, thought content normal  Patient Lines/Drains/Airways Status     Active Line/Drains/Airways     Name Placement date Placement time Site Days   Peripheral IV 05/29/24 20 G Left Antecubital 05/29/24  1523  Antecubital  1            Pertinent labs and imaging:     Latest Ref Rng & Units 06/01/2024    5:38 AM 05/30/2024    4:32 AM 05/29/2024    2:08 PM  CBC   WBC 4.0 - 10.5 K/uL 7.7  7.5    Hemoglobin 12.0 - 15.0 g/dL 86.8  87.2  86.3   Hematocrit 36.0 - 46.0 % 38.9  37.0  40.0   Platelets 150 - 400 K/uL 249  227         Latest Ref Rng & Units 06/01/2024    5:38 AM 05/30/2024    4:32 AM 05/29/2024    2:08 PM  CMP  Glucose 70 - 99 mg/dL 84  889  91   BUN 6 - 20 mg/dL 9  7  17    Creatinine 0.44 - 1.00 mg/dL 9.27  9.22  9.29   Sodium 135 - 145 mmol/L 141  136  135   Potassium 3.5 - 5.1 mmol/L 4.5  3.5  4.4   Chloride 98 - 111 mmol/L 104  103  103   CO2 22 - 32 mmol/L 24  23    Calcium  8.9 - 10.3 mg/dL 9.5  9.0      CARDIAC CATHETERIZATION Result Date: 06/01/2024 Conclusions: Stable appearance of the coronary arteries following recent PCI's to the LCx and RCA with widely patent stents.  Long segment of 50-60% stenosis in the mid/distal LAD appears unchanged and is mildly significant by hemodynamic assessment (RFR = 0.90, FFR = 0.75). Normal microvascular function (CFR 5.8, IMR 6). Coronary vasospasm of the RCA noted, which resolved with intracoronary  nitroglycerin . Low left ventricular filling pressure (LVEDP 3 mmHg). Recommendations: Continue dual antiplatelet therapy with aspirin  and prasugrel  for 12 months from PCI's to LCx and RCA in 04/2024. Add amlodipine  for treatment of coronary vasospasm, as the patient has been intolerant to long-acting nitrates. Aggressive secondary prevention of coronary artery disease. If severe chest pain at rest persists, consider further evaluation for alternative etiologies of chest pain, which seems out of proportion to her burden of CAD. Lonni Hanson, MD [facility]    ASSESSMENT/PLAN:  Assessment: Principal Problem:   Angina at rest [facility]) Active Problems:   Hypothyroidism   Anxiety state   GERD   Bilateral deafness   Palpitations   CAD (coronary artery disease)   Primary hypertension   Plan: #Vasospastic Angina #CAD s/p DES ***Today patient still reporting chest pain present and worse after  catheterization.  Status post DES on 8/6 to proximal/mid RCA and mid/distal LCx.  Also had a negative echo stress test for ischemia on 04/17/2024. Troponins are negative.  EKG on admission without ST/T wave changes.  Updated TTE with normal LVEF and function.  Cardiology is following this patient. Cath today with stable appearance of coronary arteries and previously placed stents are patent. Transient vasospasm noted to distal RCA. Muscle aches may be caused by Crestor  so we will hold. Will also increase amlodipine  to 5 mg daily. Will consider starting ranolazine but following cardiology recommendations.  - Continue Aspirin  81 mg daily - Continue prasugrel  10 mg daily - Continue Toprol -XL 50 mg daily - Continue amlodipine  5mg  daily - Hold rosuvastatin  20 mg daily - Pain Regimen:  - IV Dilaudid  0.5 mg q4h prn  - Sublingual nitroglycerin  prn  - Tramadol  50 mg q6h - Continuous cardiac monitoring  #Migraine Headaches Patient reporting worse headache after taking nitroglycerin  but notes a prior history of migraines. States that Zofran  is a headache trigger for her.  - HA cocktail with IV Benadryl  12.5 mg/IV Compazine  10 mg  - Phenergan  12.5 mg every 6 hours as needed for nausea/vomiting - Tramadol  50 mg every 6 hours as needed for severe pain  #Hypothyroidism #Myalgias #Fatigue Patient reporting myalgias and fatigue immediately prior to admission.  Also having increased need for sleep.  Recently stopped estradiol  due to contraindication with CAD. Other medications includes recent addition of rosuvastatin . Prior TSH 2 months ago was 2.370 but recheck during this admission mildly elevated at 4.634. CK wnl.  Etiology of symptoms likely multifactorial in the setting of medication adverse effect and vasomotor symptoms from menopause. - Continue Synthroid  25 mcg daily - Hold rosuvastatin  - Outpatient follow-up with OB/GYN for menopause  #Anxiety Per chart review, the patient has a history of anxiety  with current medications as BuSpar  daily and Klonopin  as needed.  Her anxiety may be a significant contributor to her symptoms of chest pain, but do not doubt cardiac nature of her pain.  She has previously tried SSRI/SNRIs but did not tolerate them. We will continue her home medications and further adjustments can be made in the outpatient setting. - PO BuSpar  20 mg 3 times daily - PO Klonopin  0.5 mg nightly as needed for anxiety  #History of hypertension She has been mostly normotensive with episodes of hypotension during this hospitalization.  Will discontinue amlodipine  due to low pressures. - Discontinue amlodipine   #Hyperlipidemia Last LDL of 109 which is not at goal.  Will continue home medications and follow further guidance from cardiology.  She can follow-up in the outpatient setting. - Hold rosuvastatin  20  mg daily  #GERD - Continue home pantoprazole  40 mg daily  Best Practice: Diet: Heart-Healthy IVF: Fluids: None, Rate: None VTE: enoxaparin  (LOVENOX ) injection 40 mg Start: 06/02/24 0800 Code: Full  Disposition planning: Therapy Recs: Pending, DME: none Family Contact: Reyes Johann (Husband), to be notified. DISPO: Anticipated discharge to Home in 1-2 days pending pain control.  Signature:  Letha Cheadle, MD [facility]  PGY-1 06/02/2024, 6:53 AM  On Call pager [phone]

## 2024-06-02 NOTE — Discharge Summary (Cosign Needed Addendum)
 Name: Donna Davis MRN: [id number] DOB: 29-Jan-1971 53 y.o. PCP: [name], MD  Date of Admission: 05/29/2024  1:22 PM Date of Discharge: 06/02/2024  Attending Physician: Dr. MICAEL Riis Winfrey  Discharge Diagnosis: Principal Problem:   Angina at rest [facility]) Active Problems:   Hypothyroidism   Anxiety state   GERD   Bilateral deafness   Palpitations   CAD (coronary artery disease)   Primary hypertension Statin myopathy Coronary Vasospasm s/p DES  Discharge Medications: Allergies as of 06/02/2024       Reactions   Onion Anaphylaxis   Amitriptyline  Anxiety, Palpitations, Other (See Comments)   hallucinations   Amoxicillin -pot Clavulanate Nausea And Vomiting   NV with high dose or for a long time. Patient tolerates low dose for short amount of time.    Bacitracin Hives, Other (See Comments)   Redness, pain at site of application   Cefuroxime Nausea And Vomiting   Cephalexin  Nausea And Vomiting   Ciprofloxacin  Other (See Comments)    Fevers   Clarithromycin Nausea And Vomiting, Other (See Comments)   Pt is OK with azithromycin   gi upset   Corticosteroids Other (See Comments)   Drastic change in mental status    Doxycycline  Hives, Nausea And Vomiting   Isosorbide  Mononitrate [isosorbide  Nitrate] Other (See Comments)   Severe migraine from this- pt refuses to take it anymore   Ondansetron  Other (See Comments)   Causes severe migraine   Propranolol  Other (See Comments)   Fatigue and makes her feel crazy, refuses to take.   Sulfa Antibiotics Other (See Comments)   Rash, vaginal blisters   Sulfonamide Derivatives Other (See Comments)   vaginal blisters   Benadryl  [diphenhydramine  Hcl (sleep)] Anxiety   Extreme agitation        Medication List     STOP taking these medications    rosuvastatin  20 MG tablet Commonly known as: CRESTOR        TAKE these medications    acetaminophen  500 MG tablet Commonly known as: TYLENOL  Take 1,000 mg by mouth every 6  (six) hours as needed for mild pain (pain score 1-3) or moderate pain (pain score 4-6).   amLODipine  2.5 MG tablet Commonly known as: NORVASC  Take 1 tablet (2.5 mg total) by mouth daily. Start taking on: June 03, 2024 What changed:  medication strength how much to take   aspirin  EC 81 MG tablet Take 81 mg by mouth daily. Swallow whole.   busPIRone  10 MG tablet Commonly known as: BUSPAR  TAKE 2 TABLETS BY MOUTH 3 TIMES A DAY   clonazePAM  0.5 MG tablet Commonly known as: KLONOPIN  Take 1 tablet (0.5 mg total) by mouth 2 (two) times daily as needed for anxiety.   dicyclomine  10 MG capsule Commonly known as: BENTYL  Take 1 capsule (10 mg total) by mouth daily as needed for spasms.   fluticasone  50 MCG/ACT nasal spray Commonly known as: FLONASE  SPRAY 1 SPRAY INTO EACH NOSTRIL EVERY DAY What changed: See the new instructions.   levothyroxine  25 MCG tablet Commonly known as: SYNTHROID  TAKE 1 TABLET BY MOUTH EVERY DAY What changed: when to take this   lidocaine  5 % Commonly known as: LIDODERM  Place 1 patch onto the skin daily. Remove & Discard patch within 12 hours or as directed by MD   metoprolol  succinate 25 MG 24 hr tablet Commonly known as: TOPROL -XL Take 0.5 tablets (12.5 mg total) by mouth daily. Start taking on: June 03, 2024 What changed:  medication strength how much to take additional  instructions   nitroGLYCERIN  0.4 MG SL tablet Commonly known as: NITROSTAT  Place 1 tablet (0.4 mg total) under the tongue every 5 (five) minutes as needed for chest pain.   pantoprazole  40 MG tablet Commonly known as: PROTONIX  TAKE 1 TABLET BY MOUTH EVERY DAY   prasugrel  10 MG Tabs tablet Commonly known as: EFFIENT  Take 1 tablet (10 mg total) by mouth daily.   promethazine  12.5 MG tablet Commonly known as: PHENERGAN  Take 1 tablet (12.5 mg total) by mouth every 6 (six) hours as needed for nausea or vomiting.   STOOL SOFTENER PO Take 1 tablet by mouth 2 (two)  times daily as needed.   traMADol  50 MG tablet Commonly known as: ULTRAM  Take 1 tablet (50 mg total) by mouth every 6 (six) hours as needed for severe pain (pain score 7-10).   VITAMIN C PO Take 1 tablet by mouth in the morning.        Disposition and follow-up:   Ms.Brynna P Wadley was discharged from [facility] in Stable condition.  At the hospital follow up visit please address:  1.  Follow-up:   a. Coronary Vasospasm - she was started on amlodipine  2.5 mg daily.  Assess for recurrence of her chest pain and whether this medication is helping her.  She does not tolerate nitroglycerin  due to migraines.  She was discharged on her home tramadol  and some lidocaine  patches.    b. Fatigue - she likely needs referral to OB/GYN if not already done for alternative therapies to HRT.   c. Hypothyroidism - TSH mildly elevated with current dose.     d. Statin myopathy - discontinued her rosuvastatin  she should have referral to the cardiology lipid clinic for alternative therapies.  2.  Labs / imaging needed at time of follow-up: None  3.  Pending labs/ test needing follow-up: None  4. Medication changes: decreased amlodipine  to 2.5mg , decreased metoprolol  to 12.5mg , discontinued rosuvastatin   Follow-up Appointments:  Follow-up Information     [facility] Follow up.   Why: Patient wants to move medications to this pharmacy. Contact information: [phone]        [name], MD .   Specialty: Internal Medicine Contact information: [address] [place], [address] [place] [address] [phone]                 Hospital Course by problem list: #Coronary vasospasm #CAD s/p DES Previous heart catheterization on  04/22/2024 with 2 DES placed in RCA and mid/distal left circumflex with 0% occlusion postintervention.  Patient did have residual 55% stenosis in the LAD being managed medically.  Reporting chest pain similar to episode prior to  hospitalization and stenting with nausea, palpitations, pounding, pressure, radiation to back, tingling in left arm unresolved with nitroglycerin .  Cardiac workup has been negative for ACS and PE has been ruled out.  EKG sinus rhythm with no ST elevations, chest x-ray unremarkable, CTA unremarkable, troponins unremarkable.  Nitroglycerin  ordered but patient does not appear to get relief and causes headaches.  Cardiology conducted another heart catheterization during this admission which showed stable appearance of coronary arteries and widely patent stents.  They did notice a coronary vasospasm of the RCA which resolved with intracoronary nitroglycerin .  She received fentanyl  which did not help her pain but did receive relief from Dilaudid .  Restarted her home tramadol  which has improved her pain.  She was started on amlodipine  5 mg but due to hypotensive episodes this was cut in half  to 2.5 mg daily.  Toprol  12.5 mg daily.  Her Crestor  was discontinued due to leg cramping.  Cardiology recommended follow-up in the lipid clinic for alternative therapy to optimize lipid control.   #Fatigue #Hypothyroidism #Menopause Patient complaining of worsening fatigue for the past month and increased need for sleep which is interfering with her quality of life.  She was previously taking estradiol  which was stopped due to her CAD.  She is still mentioning some symptoms while in the hospital, but she is okay to follow-up with gynecology for further recommendations.  Has tried SSRIs/SNRIs for anxiety in the past and did not tolerate them.  TSH checked during this admission showed mildly elevated.  We continued her home Synthroid  dose.  This may have also been related to her metoprolol  but we have decreased her dose. She will need outpatient follow up for her fatigue and vasomotor symptoms.  #Anxiety Continued her home medications.  She did experience significant anxiety likely related to her chest pain.  She received  additional PRNs of Klonopin  which did not significantly help her pain or anxiety.  After receiving Dilaudid  for pain control, her anxiety significantly improved.  We continued her BuSpar  and nightly Klonopin  as needed.  #Hypertension Previously had a history of hypertension.  During this hospitalization she had episodes of hypotension.  We decreased the doses of her metoprolol  to 12.5 mg daily and amlodipine  2.5 mg daily.  #Hyperlipidemia #Statin myopathy Last LDL of 109 not at goal.  Discontinue her Crestor  20 mg daily due to myalgias.  Cardiology to refer to lipid clinic.  #GERD No acute concerns during this hospitalization, continued home pantoprazole .   Discharge Subjective: Ms.Alisha P Dibbern reports her chest pain has significantly improved compared to yesterday after her dilaudid  and with her tramadol . She still has some shortness of breath but overall improved compared to when she was admitted. She is fine with seeing PA in Cardiology clinic but would like future appointments with a physician. She is frustrated because she would like clearance to drive from her cardiologist. Patient is medically ready for discharge.  Discharge Exam:   BP 117/64 (BP Location: Left Arm)   Pulse 63   Temp 98.6 F (37 C) (Oral)   Resp 20   Ht 5' (1.524 m)   Wt 83.4 kg   LMP 12/26/2004   SpO2 99%   BMI 35.91 kg/m  Physical Exam: Constitutional: well-appearing, obese, in no acute distress HENT: normocephalic atraumatic, mucous membranes moist; deaf Eyes: conjunctiva non-erythematous, PERRL, no scleral icterus Cardiovascular: regular rate and rhythm, no m/r/g Pulmonary/Chest: normal work of breathing on room air, anterior lung fields CTAB Abdominal: soft, non-tender, non-distended, bowel sounds normal MSK: normal bulk and tone Neurological: alert & oriented x3 Skin: warm and dry Extremities: right radial catheter site with no overlying swelling, no cyanosis; peripheral pulses intact Psych:  anxious mood and affect, thought content normal   Pertinent Labs, Studies, and Procedures:     Latest Ref Rng & Units 06/02/2024   11:07 AM 06/01/2024    5:38 AM 05/30/2024    4:32 AM  CBC  WBC 4.0 - 10.5 K/uL 7.0  7.7  7.5   Hemoglobin 12.0 - 15.0 g/dL 87.2  86.8  87.2   Hematocrit 36.0 - 46.0 % 37.5  38.9  37.0   Platelets 150 - 400 K/uL 234  249  227        Latest Ref Rng & Units 06/02/2024   11:07 AM 06/01/2024    5:38  AM 05/30/2024    4:32 AM  CMP  Glucose 70 - 99 mg/dL 84  84  889   BUN 6 - 20 mg/dL 8  9  7    Creatinine 0.44 - 1.00 mg/dL 9.12  9.27  9.22   Sodium 135 - 145 mmol/L 135  141  136   Potassium 3.5 - 5.1 mmol/L 3.6  4.5  3.5   Chloride 98 - 111 mmol/L 105  104  103   CO2 22 - 32 mmol/L 20  24  23    Calcium  8.9 - 10.3 mg/dL 9.4  9.5  9.0     CT Angio Chest PE W and/or Wo Contrast Result Date: 05/29/2024 CLINICAL DATA:  Left chest pressure and arm pain EXAM: CT ANGIOGRAPHY CHEST WITH CONTRAST TECHNIQUE: Multidetector CT imaging of the chest was performed using the standard protocol during bolus administration of intravenous contrast. Multiplanar CT image reconstructions and MIPs were obtained to evaluate the vascular anatomy. RADIATION DOSE REDUCTION: This exam was performed according to the departmental dose-optimization program which includes automated exposure control, adjustment of the mA and/or kV according to patient size and/or use of iterative reconstruction technique. CONTRAST:  75mL OMNIPAQUE  IOHEXOL  350 MG/ML SOLN COMPARISON:  Chest x-ray 05/29/2024, chest CT 03/03/2024 FINDINGS: Cardiovascular: Satisfactory opacification of the pulmonary arteries to the segmental level. No evidence of pulmonary embolism. Nonaneurysmal aorta. No dissection. Multi vessel coronary vascular calcification. Borderline cardiomegaly. No significant pericardial effusion Mediastinum/Nodes: Patent trachea. No thyroid  mass. No suspicious lymph nodes. Esophagus within normal limits.  Lungs/Pleura: No acute airspace disease, pleural effusion, or pneumothorax Upper Abdomen: No acute finding Musculoskeletal: No acute osseous abnormality Review of the MIP images confirms the above findings. IMPRESSION: 1. Negative for acute pulmonary embolus or aortic dissection. 2. Borderline to mild cardiomegaly with multi vessel coronary vascular calcification. Electronically Signed   By: Luke Bun M.D.   On: 05/29/2024 16:50   DG Chest 2 View Result Date: 05/29/2024 CLINICAL DATA:  Chest pain. EXAM: CHEST - 2 VIEW COMPARISON:  04/17/2024. FINDINGS: The heart size and mediastinal contours are within unchanged. No focal consolidation, pleural effusion, or pneumothorax. Levocurvature of the thoracic spine. No acute osseous abnormality. IMPRESSION: No acute cardiopulmonary findings. Electronically Signed   By: Harrietta Sherry M.D.   On: 05/29/2024 15:06     Discharge Instructions:   Discharge Instructions      To Miller P Cuda or their caretakers,  You were recently admitted to [facility] for recurrent chest pain and coronary artery vasospasm.  You were started on reduced doses of amlodipine  and metoprolol  which should help your chest pain from coronary vasospasm.  He received a heart catheterization by cardiology which showed no obstruction in your arteries and your stents are still open and working.  We have discontinued your rosuvastatin  (Crestor ) due to concerns for muscle aches.  Continue taking your home medications with the following changes:  Start taking Amlodipine  (Norvasc ) 2.5 mg daily Metoprolol  succinate (Toprol -XL) 12.5 mg daily Lidocaine  patches Stop taking Rosuvastatin  (Crestor ) 20 mg   You should seek further medical care if you experience worsening of your chest pain, abdominal pain, unrelenting nausea/vomiting.  Please follow up with the following doctors/specialties: Cardiology Lipid Clinic - our inpatient cardiologists have already placed a referral for  you to be seen in our lipid clinic.  Please be on the look out for a call to schedule an appointment. Cardiology - you have an appointment with Aline Door, PA-C on 06/19/2024 8:25 AM.  We  have also scheduled you a hospital follow up appointment with Dr. Karna on 9/23 at 9:45am. We are so glad that you are feeling better.  Sincerely,  [facility]      Signed:  Letha Cheadle, MD Internal Medicine Resident, PGY-1 06/02/2024, 2:15 PM Please contact the on call pager after 5 pm and on weekends at [phone].

## 2024-06-02 NOTE — Discharge Instructions (Addendum)
 To Donna Davis or their caretakers,  You were recently admitted to [facility] for recurrent chest pain and coronary artery vasospasm.  You were started on reduced doses of amlodipine  and metoprolol  which should help your chest pain from coronary vasospasm.  He received a heart catheterization by cardiology which showed no obstruction in your arteries and your stents are still open and working.  We have discontinued your rosuvastatin  (Crestor ) due to concerns for muscle aches.  Continue taking your home medications with the following changes:  Start taking Amlodipine  (Norvasc ) 2.5 mg daily Metoprolol  succinate (Toprol -XL) 12.5 mg daily Lidocaine  patches Stop taking Rosuvastatin  (Crestor ) 20 mg   You should seek further medical care if you experience worsening of your chest pain, abdominal pain, unrelenting nausea/vomiting.  Please follow up with the following doctors/specialties: Cardiology Lipid Clinic - our inpatient cardiologists have already placed a referral for you to be seen in our lipid clinic.  Please be on the look out for a call to schedule an appointment. Cardiology - you have an appointment with Aline Door, PA-C on 06/19/2024 8:25 AM.  We have also scheduled you a hospital follow up appointment with Dr. Karna on 9/23 at 9:45am. We are so glad that you are feeling better.  Sincerely,  [facility]

## 2024-06-02 NOTE — Plan of Care (Signed)
  Problem: Education: Goal: Knowledge of General Education information will improve Description: Including pain rating scale, medication(s)/side effects and non-pharmacologic comfort measures Outcome: Progressing   Problem: Health Behavior/Discharge Planning: Goal: Ability to manage health-related needs will improve Outcome: Progressing   Problem: Clinical Measurements: Goal: Ability to maintain clinical measurements within normal limits will improve Outcome: Progressing Goal: Diagnostic test results will improve Outcome: Progressing Goal: Cardiovascular complication will be avoided Outcome: Progressing   Problem: Activity: Goal: Risk for activity intolerance will decrease Outcome: Progressing   Problem: Nutrition: Goal: Adequate nutrition will be maintained Outcome: Progressing   Problem: Coping: Goal: Level of anxiety will decrease Outcome: Progressing   Problem: Safety: Goal: Ability to remain free from injury will improve Outcome: Progressing

## 2024-06-03 ENCOUNTER — Telehealth: Payer: Self-pay

## 2024-06-03 ENCOUNTER — Ambulatory Visit: Admitting: Cardiovascular Disease

## 2024-06-03 ENCOUNTER — Other Ambulatory Visit (HOSPITAL_COMMUNITY): Payer: Self-pay

## 2024-06-03 NOTE — Patient Instructions (Signed)
 Visit Information  Thank you for taking time to visit with me today. Please don't hesitate to contact me if I can be of assistance to you before our next scheduled telephone appointment.  Our next appointment is by telephone on Wednesday September 24th at 2:00pm  Following is a copy of your care plan:   Goals Addressed             This Visit's Progress    VBCI Transitions of Care (TOC) Care Plan       Problems:  Recent Hospitalization for treatment of Anxiety, CAD, and HTN Hospital or ED Admin Risk is 42%  Goal:  Over the next 30 days, the patient will not experience hospital readmission  Interventions:   CAD Interventions: Assessed understanding of CAD diagnosis Medications reviewed including medications utilized in CAD treatment plan Provided education on Importance of limiting foods high in cholesterol Counseled on importance of regular laboratory monitoring as prescribed Reviewed Importance of taking all medications as prescribed Reviewed Importance of attending all scheduled provider appointments Advised to report any changes in symptoms or exercise tolerance Screening for signs and symptoms of depression related to chronic disease state   Hypertension Interventions: Last practice recorded BP readings:  BP Readings from Last 3 Encounters:  06/02/24 117/64  05/13/24 120/80  05/02/24 133/81   Most recent eGFR/CrCl:  Lab Results  Component Value Date   EGFR 104 05/31/2022    No components found for: CRCL  Reviewed medications with patient and discussed importance of compliance Counseled on the importance of exercise goals with target of 150 minutes per week Discussed plans with patient for ongoing care management follow up and provided patient with direct contact information for care management team Reviewed scheduled/upcoming provider appointments including:  Provided education on prescribed diet Heart Healthy Screening for signs and symptoms of depression  related to chronic disease state  Screening for Anxiety related to symptoms related to Anxiety - The patient has some panic attacks and is now on Buspar  and Klonopin   Patient Self Care Activities:  Attend all scheduled provider appointments Call pharmacy for medication refills 3-7 days in advance of running out of medications Call provider office for new concerns or questions  Notify RN Care Manager of [facility] call rescheduling needs Participate in Transition of Care Program/Attend [facility] scheduled calls Perform all self care activities independently  Take medications as prescribed    Plan:  Telephone follow up appointment with care management team member scheduled for:  Wednesday September 24th at 2:00pm        Patient verbalizes understanding of instructions and care plan provided today and agrees to view in MyChart. Active MyChart status and patient understanding of how to access instructions and care plan via MyChart confirmed with patient.     The patient has been provided with contact information for the care management team and has been advised to call with any health related questions or concerns.   Please call the care guide team at [phone] if you need to cancel or reschedule your appointment.   Please call the Suicide and Crisis Lifeline: 988 call the [place]  National Suicide Prevention Lifeline: (407) 574-6591 or TTY: (514)079-7158 TTY 313-507-4684) to talk to a trained counselor if you are experiencing a Mental Health or Behavioral Health Crisis or need someone to talk to.  Medford Balboa, BSN, RN [place]  VBCI - [employer] [facility] RN Care Manager [phone]

## 2024-06-03 NOTE — Transitions of Care (Post Inpatient/ED Visit) (Signed)
 06/03/2024  Name: Donna Davis MRN: [id number] DOB: 1971/07/14  Today's TOC FU Call Status: Today's TOC FU Call Status:: Successful TOC FU Call Completed TOC FU Call Complete Date: 06/03/24 Patient's Name and Date of Birth confirmed.  Transition Care Management Follow-up Telephone Call Date of Discharge: 06/02/24 Discharge Facility: [facility]) Type of Discharge: Inpatient Admission Primary Inpatient Discharge Diagnosis:: Angina How have you been since you were released from the hospital?: Better  Items Reviewed: Did you receive and understand the discharge instructions provided?: No Medications obtained,verified, and reconciled?: Yes (Medications Reviewed) Any new allergies since your discharge?: No Dietary orders reviewed?: Yes Type of Diet Ordered:: Heart Healthy Do you have support at home?: Yes People in Home [RPT]: spouse Name of Support/Comfort Primary Source: Lailani Tool  Medications Reviewed Today: Medications Reviewed Today     Reviewed by Moises Reusing, RN (Case Manager) on 06/03/24 at 1305  Med List Status: <None>   Medication Order Taking? Sig Documenting Provider Last Dose Status Informant  acetaminophen  (TYLENOL ) 500 MG tablet [id number] No Take 1,000 mg by mouth every 6 (six) hours as needed for mild pain (pain score 1-3) or moderate pain (pain score 4-6). [provider] 05/29/2024 Active Self, Pharmacy Records, Multiple Informants  amLODipine  (NORVASC ) 2.5 MG tablet [id number]  Take 1 tablet (2.5 mg total) by mouth daily. Waymond Cart, MD  Active   Ascorbic Acid (VITAMIN C PO) [id number] No Take 1 tablet by mouth in the morning. [provider] 05/29/2024 Active Self, Pharmacy Records, Multiple Informants           Med Note (GARNER, TIFFANY L   Fri Apr 10, 2024  3:08 PM)    aspirin  EC 81 MG tablet [id number] No Take 81 mg by mouth daily. Swallow whole. [provider] 05/29/2024 Active Self, Pharmacy Records, Multiple Informants   busPIRone  (BUSPAR ) 10 MG tablet [id number] No TAKE 2 TABLETS BY MOUTH 3 TIMES A DAY Lau, Grace, MD 05/29/2024 Active Self, Pharmacy Records, Multiple Informants           Med Note STEFFI, ADELITA   Fri May 29, 2024  4:54 PM)    clonazePAM  (KLONOPIN ) 0.5 MG tablet [id number] No Take 1 tablet (0.5 mg total) by mouth 2 (two) times daily as needed for anxiety. Joane Artist RAMAN, MD 05/28/2024 Active Self, Pharmacy Records, Multiple Informants           Med Note STEFFI, ALEXANDRIA   Fri May 29, 2024  4:54 PM)    dicyclomine  (BENTYL ) 10 MG capsule [id number] No Take 1 capsule (10 mg total) by mouth daily as needed for spasms.  Patient not taking: No sig reported   Stacia Glendia BRAVO, MD Not Taking Active Self, Pharmacy Records, Multiple Informants  Docusate Calcium  (STOOL SOFTENER PO) [id number] No Take 1 tablet by mouth 2 (two) times daily as needed.  Patient not taking: No sig reported   [provider] Not Taking Active Self, Pharmacy Records, Multiple Informants  fluticasone  (FLONASE ) 50 MCG/ACT nasal spray [id number] No SPRAY 1 SPRAY INTO EACH NOSTRIL EVERY DAY  Patient taking differently: Place 1 spray into both nostrils in the morning.   [name], MD 05/29/2024 Active Self, Pharmacy Records, Multiple Informants           Med Note STEFFI, ADELITA   Fri May 29, 2024  4:54 PM)    levothyroxine  (SYNTHROID ) 25 MCG tablet [id number] No TAKE 1 TABLET BY MOUTH EVERY DAY  Patient taking differently: Take 25 mcg by mouth  in the morning.   [name], MD 05/29/2024 Active Self, Pharmacy Records, Multiple Informants           Med Note STEFFI, ADELITA   Fri May 29, 2024  4:54 PM)    lidocaine  (LIDODERM ) 5 % [id number]  Place 1 patch onto the skin daily. Remove & Discard patch within 12 hours or as directed by MD Waymond Cart, MD  Active   metoprolol  succinate (TOPROL -XL) 25 MG 24 hr tablet [id number]  Take 0.5 tablets (12.5 mg total) by mouth daily. Waymond Cart, MD  Active   nitroGLYCERIN   (NITROSTAT ) 0.4 MG SL tablet [id number] No Place 1 tablet (0.4 mg total) under the tongue every 5 (five) minutes as needed for chest pain. Zhao, Xika, NP 05/29/2024  8:00 AM Active Self, Pharmacy Records, Multiple Informants  pantoprazole  (PROTONIX ) 40 MG tablet [id number] No TAKE 1 TABLET BY MOUTH EVERY DAY [name], MD 05/29/2024 Active Self, Pharmacy Records, Multiple Informants           Med Note STEFFI, ALEXANDRIA   Fri May 29, 2024  4:54 PM)    prasugrel  (EFFIENT ) 10 MG TABS tablet [id number] No Take 1 tablet (10 mg total) by mouth daily. Zhao, Xika, NP 05/29/2024 Active Self, Pharmacy Records, Multiple Informants  promethazine  (PHENERGAN ) 12.5 MG tablet [id number] No Take 1 tablet (12.5 mg total) by mouth every 6 (six) hours as needed for nausea or vomiting. [name], MD Past Week Active Self, Pharmacy Records, Multiple Informants  traMADol  (ULTRAM ) 50 MG tablet [id number]  Take 1 tablet (50 mg total) by mouth every 6 (six) hours as needed for severe pain (pain score 7-10). [name], MD  Active             Home Care and Equipment/Supplies: Were Home Health Services Ordered?: NA Any new equipment or medical supplies ordered?: NA  Functional Questionnaire: Do you need assistance with bathing/showering or dressing?: No Do you need assistance with meal preparation?: No Do you need assistance with eating?: No Do you have difficulty maintaining continence: No Do you need assistance with getting out of bed/getting out of a chair/moving?: No Do you have difficulty managing or taking your medications?: No  Follow up appointments reviewed: PCP Follow-up appointment confirmed?: Yes Date of PCP follow-up appointment?: 06/09/24 Follow-up Provider: Fellows Karna Specialist [facility] Follow-up appointment confirmed?: Yes Date of Specialist follow-up appointment?: 06/19/24 Follow-Up Specialty Provider:: Aline Door Do you need transportation to your follow-up appointment?: No Do you  understand care options if your condition(s) worsen?: Yes-patient verbalized understanding  SDOH Interventions Today    Flowsheet Row Most Recent Value  SDOH Interventions   Food Insecurity Interventions Intervention Not Indicated  Housing Interventions Intervention Not Indicated  Transportation Interventions Intervention Not Indicated  Utilities Interventions Intervention Not Indicated    Goals Addressed             This Visit's Progress    VBCI Transitions of Care (TOC) Care Plan       Problems:  Recent Hospitalization for treatment of Anxiety, CAD, and HTN Hospital or ED Admin Risk is 42%  Goal:  Over the next 30 days, the patient will not experience hospital readmission  Interventions:   CAD Interventions: Assessed understanding of CAD diagnosis Medications reviewed including medications utilized in CAD treatment plan Provided education on Importance of limiting foods high in cholesterol Counseled on importance of regular laboratory monitoring as prescribed Reviewed Importance of taking all medications as prescribed Reviewed Importance of attending all scheduled provider appointments Advised  to report any changes in symptoms or exercise tolerance Screening for signs and symptoms of depression related to chronic disease state   Hypertension Interventions: Last practice recorded BP readings:  BP Readings from Last 3 Encounters:  06/02/24 117/64  05/13/24 120/80  05/02/24 133/81   Most recent eGFR/CrCl:  Lab Results  Component Value Date   EGFR 104 05/31/2022    No components found for: CRCL  Reviewed medications with patient and discussed importance of compliance Counseled on the importance of exercise goals with target of 150 minutes per week Discussed plans with patient for ongoing care management follow up and provided patient with direct contact information for care management team Reviewed scheduled/upcoming provider appointments including:  Provided  education on prescribed diet Heart Healthy Screening for signs and symptoms of depression related to chronic disease state  Screening for Anxiety related to symptoms related to Anxiety - The patient has some panic attacks and is now on Buspar  and Klonopin   Patient Self Care Activities:  Attend all scheduled provider appointments Call pharmacy for medication refills 3-7 days in advance of running out of medications Call provider office for new concerns or questions  Notify RN Care Manager of [facility] call rescheduling needs Participate in Transition of Care Program/Attend [facility] [place] scheduled calls Perform all self care activities independently  Take medications as prescribed    Plan:  Telephone follow up appointment with care management team member scheduled for:  Wednesday September 24th at 2:00pm         Medford Balboa, BSN, RN [place]  VBCI - [employer] [facility] RN Care Manager [phone]

## 2024-06-03 NOTE — Telephone Encounter (Signed)
-----   Message from [facility] sent at 06/03/2024  1:39 PM EDT -----  S - Patient was recently discharged on Tuesday September 16th from [facility]   .We are notifying you that the patient has been enrolled in our Transitions of Care [facility]) program to support their post-discharge recovery and reduce the risk of readmission. B - The patient was admitted for  Angina    .The [facility] program includes follow-up calls, medication reconciliation, and coordination of follow-up appointments. A - The patient is currently stable. Our team will monitor their progress and provide support as needed. R - Please be aware of the patient's enrollment in the [facility] program. If you have any questions or need additional information, feel free to contact me.    Medford Balboa, BSN, RN   VBCI - [employer] [facility] RN Care Manager [phone]

## 2024-06-06 NOTE — Progress Notes (Deleted)
 Cardiology Office Note:    Date:  06/06/2024   ID:  Donna Davis, Donna Davis Aug 16, 1971, MRN [id number]  PCP:  [name], MD  Cardiologist:  Ozell Fell, MD { Click to update primary MD,subspecialty MD or APP then REFRESH:1}    Referring MD: [name], MD   Chief Complaint: hospital follow-up of chest pain   History of Present Illness:    Donna Davis is a 53 y.o. female with a history of multivessel CAD s/p recent DES to RCA and angioplasty/ DES to LCX on 04/22/2024, palpitations with short runs of SVT noted on monitor in 03/2024, hyperlipidemia, hypothyroidism, GERD, IBS, low back pain, anxiety, multiple mediation intolerances, and bilateral deafness who is followed by Dr. Edwyna and presents today for hospital follow-up of chest pain.   Patient was referred to Dr. Revankar on 03/19/2024 for further evaluation of palpitations and chest pain. She brought in an EKG strip that showed frequent PVCs. 2 week Zio monitor, TTE, and stress Echo were ordered for further evaluation. TTE showed LVEF of 65-70% with normal wall motion and grade 1 diastolic dysfunction, normal RV function, and no significant valvular disease. Monitor showed 9 short runs of SVT (longest run 15 beats) and rare PACs/ PVCs but no significant arrhythmias. She had a syncopal episode following removal of this monitor. She was seen by Dr Edwyna again on 04/14/2024. Stress Echo was was still pending at that time. She continued to have palpitations and chest pain but denied any recurrent chest pain. A second 15 day monitor was ordered and she was started on Toprol -XL 25mg  daily. Stress Echo on 04/17/2024 was negative. She was subsequently admitted a couple of days later for persistent chest pain concerning for unstable angina since her stress Echo. LHC showed significant 3 vessel CAD involving the LAD, LCX, and RCA that all appeared hemodynamically significant by virtual FFR. She was seen by CT Surgery and seen by Dr. Shyrl who felt the  LAD lesion was non-critical and felt PCI to RCA and LCX was felt to be the better treatment option. Therefore, she was taken back to the cath lab on 04/22/2024 and underwent successful IVUS guided PCI with DES to RCA and angioplasty and DES to mid to distal LCX.   She was admitted again from 05/29/2024 to 06/02/2024 for recurrent chest pain. She reported chest pressure that radiated to her left arm multiple times throughout the night. Pain was relieved with Nitroglycerin . High-sensitivity troponin was negative. Limited Echo showed LVEF of 55-60%, normal RV function, and no significant valvular disease. She underwent repeat LHC which showed stable appearance of the coronary arteries with widely patent stents. There was a long segment of 50-60% stenosis in the mid to distal LAD that appeared unchanged but was mildly significant by hemodynamic assessment (RFR = 0.90, FFR = 0.75). Microvascular function was normal but coronary vasospasm was noted in the RCA and resolved with intracoronary Nitroglycerin . She was started on Amlodipine  for coronary vasospasms. Crestor  was stopped due to reports of myalgias and she was referred to the Lipid Clinic.   Patient presents today for follow-up. ***  CAD Coronary Vasospasm  Patient has struggled with recurrent chest pain over the last couple of months. LHC in 04/2024 showed multivessel CAD involving the LAD, LCx, and RCA. She was seen by CT Surgery but PCI was felt to be the better option so she underwent successful IVUS guided PCI with DES to RCA and angioplasty and DES to mid to distal LCX. Repeat LHC  on 06/01/2024 showed stable stable disease with patent stents and evidence of coronary vasospasm in the RCA. Continue Medical therapy was recommended and she was started on Amlodipine .  - *** - Continue Amlodipine  2.5mg  daily.  - Previously unable to tolerate Imdur  due to migraines.  - Continue DAPT with Aspirin  81mg  daily and Effient  10mg  daily.  - Intolerant to statins. Has  been referred to Lipid Clinic for consideration of PCSK9 inhibitor.    Palpitations Paroxysmal SVT Patient has a history of palpitation. Recent monitor in 03/2024 showed 9 short runs of SVT (longest run 15 beats) and rare PACs/ PVCs but no significant arrhythmias.  - *** - Continue Toprol -XL 12.5 mg daily.   Syncope Patient had a syncopal episode in 03/2024 after removing a Zio monitor which showed no significant arrhythmias. Therefore, a second 2 week monitor was ordered and showed *** Echo in 05/2024 showed normal LV function with no significant valvular disease.  - No recurrent syncope.  - ***  Hyperlipidemia Lipid panel on 04/22/2024: Total Cholesterol 185, Triglycerides 122, HDL 52, LDL 109. LDL goal <70 given CAD. - Recently started on Crestor  in 04/2024 but unable to tolerate this due to myalgias.  - She has been referred to the Lipid Clinic for consideration of PCSK9 inhibitor.    EKGs/Labs/Other Studies Reviewed:    The following studies were reviewed:  Echocardiogram 03/27/2024: Impressions: 1. Left ventricular ejection fraction, by estimation, is 65 to 70%. Left  ventricular ejection fraction by 3D volume is 68 %. The left ventricle has  normal function. The left ventricle has no regional wall motion  abnormalities. Left ventricular diastolic   parameters are consistent with Grade I diastolic dysfunction (impaired  relaxation). The average left ventricular global longitudinal strain is  -20.0 %. The global longitudinal strain is normal.   2. Right ventricular systolic function is normal. The right ventricular  size is normal.   3. The mitral valve is normal in structure. Trivial mitral valve  regurgitation. No evidence of mitral stenosis.   4. The aortic valve is normal in structure. Aortic valve regurgitation is  not visualized. No aortic stenosis is present.   5. The inferior vena cava is dilated in size with >50% respiratory  variability, suggesting right atrial pressure  of 8 mmHg.  _______________   Monitor 03/19/2024 to 04/02/2024: Patient had a min HR of 53 bpm, max HR of 152 bpm, and avg HR of 75 bpm.    Predominant underlying rhythm was Sinus Rhythm.    9 Supraventricular Tachycardia runs occurred, the run with the fastest interval lasting 4 beats with a max rate of 152 bpm, the  longest lasting 15 beats with an avg rate of 97 bpm. Isolated SVEs were rare (<1.0%), SVE Couplets were rare (<1.0%), and SVE Triplets were rare (<1.0%).    Isolated VEs were rare (<1.0%), VE Couplets were rare (<1.0%), and no VE Triplets were present. Ventricular Bigeminy and Trigeminy were present.    Impression: Mildly abnormal but largely unremarkable event monitor.  Brief atrial runs noted. _______________   Echo Stress Test 04/17/2024: Imrpessions: 1. This is a negative stress echocardiogram for ischemia.   2. This is a low risk study.   3. Grossly normal wall motion with stress however image quality is  reduced due to suboptimal acoustic windows for imaging. If symptoms  persist, consider alternate imaging modality for stress.  _______________   Left Cardiac Catheterization 04/20/2024: Conclusions: Significant three-vessel coronary artery disease, as detailed below, including diffuse mid LAD  disease of up to 50-60%, 90% distal LCx lesion, and sequential 60-80% proximal/mid RCA stenoses.  LAD, LCx, and RCA lesions are all hemodynamically significant by virtual FFR. Upper normal left ventricular filling pressure (LVEDP 15 mmHg).   Recommendations: Given ongoing chest pain over the last 3 days, admit for inpatient cardiac surgery consultation and revascularization (CABG versus multivessel PCI).  If the patient is not a good surgical candidate, I would favor PCI to LCx and RCA with medical therapy of LAD given long segment of mid LAD disease. Aggressive secondary prevention of coronary artery disease. Initiate heparin  infusion 2 hours after TR band has been removed.    Diagnostic Dominance: Right  _______________   Coronary Stent Intervention 04/22/2024:   Mid LAD to Dist LAD lesion is 55% stenosed.   Mid RCA lesion is 65% stenosed.   1st Diag lesion is 50% stenosed.   Prox RCA lesion is 75% stenosed.   Dist RCA lesion is 40% stenosed.   Mid Cx to Dist Cx lesion is 90% stenosed.   Mid Cx lesion is 30% stenosed.   A drug-eluting stent was successfully placed using a STENT SYNERGY XD [id number].   A drug-eluting stent was successfully placed using a STENT SYNERGY XD [id number].   Post intervention, there is a 0% residual stenosis.   Post intervention, there is a 0% residual stenosis.   Post intervention, there is a 0% residual stenosis.   1.  Mildly elevated left ventricular end-diastolic pressure at 18 mmHg. 2.  Successful IVUS guided PCI and drug-eluting stent placement to the right coronary artery extending to the ostium.  A 2.5 mm stent was placed and postdilated to a 3.0 proximally. 3.  Successful angioplasty and drug-eluting stent placement to the mid/distal left circumflex.  The stent partially jailed in OM branch which had normal flow and minimal stenosis at the ostium. 4.  The patient required high doses of sedatives and narcotics.   Recommendations: Dual antiplatelet therapy for at least 6 months. Aggressive treatment of risk factors.   Diagnostic Dominance: Right  Intervention    _______________  Limited Echocardiogram 05/31/2024: 1. Left ventricular ejection fraction, by estimation, is 55 to 60%. The  left ventricle has normal function. Left ventricular endocardial border  not optimally defined to evaluate regional wall motion.   2. Right ventricular systolic function is normal. The right ventricular  size is normal. Tricuspid regurgitation signal is inadequate for assessing  PA pressure.   3. The mitral valve is normal in structure. No evidence of mitral valve  regurgitation. No evidence of mitral stenosis.   4. The aortic valve was  not well visualized. Aortic valve regurgitation  is not visualized. No aortic stenosis is present.   5. The inferior vena cava is normal in size with greater than 50%  respiratory variability, suggesting right atrial pressure of 3 mmHg.  _______________  Left Cardiac Catheterization 06/01/2024: Conclusions: Stable appearance of the coronary arteries following recent PCI's to the LCx and RCA with widely patent stents.  Long segment of 50-60% stenosis in the mid/distal LAD appears unchanged and is mildly significant by hemodynamic assessment (RFR = 0.90, FFR = 0.75). Normal microvascular function (CFR 5.8, IMR 6). Coronary vasospasm of the RCA noted, which resolved with intracoronary nitroglycerin . Low left ventricular filling pressure (LVEDP 3 mmHg).   Recommendations: Continue dual antiplatelet therapy with aspirin  and prasugrel  for 12 months from PCI's to LCx and RCA in 04/2024. Add amlodipine  for treatment of coronary vasospasm, as the patient has been intolerant  to long-acting nitrates. Aggressive secondary prevention of coronary artery disease. If severe chest pain at rest persists, consider further evaluation for alternative etiologies of chest pain, which seems out of proportion to her burden of CAD.  Diagnostic Dominance: Right     EKG:  EKG not ordered today.   Recent Labs: 03/05/2024: Magnesium  2.0 05/29/2024: ALT 15 05/30/2024: TSH 4.634 06/02/2024: BUN 8; Creatinine, Ser 0.87; Hemoglobin 12.7; Platelets 234; Potassium 3.6; Sodium 135  Recent Lipid Panel    Component Value Date/Time   CHOL 185 04/22/2024 0331   TRIG 122 04/22/2024 0331   HDL 52 04/22/2024 0331   CHOLHDL 3.6 04/22/2024 0331   VLDL 24 04/22/2024 0331   LDLCALC 109 (H) 04/22/2024 0331    Physical Exam:    Vital Signs: LMP 12/26/2004     Wt Readings from Last 3 Encounters:  06/03/24 173 lb 3.2 oz (78.6 kg)  05/29/24 183 lb 13.8 oz (83.4 kg)  05/13/24 183 lb 12.8 oz (83.4 kg)     General: 53 y.o.  female in no acute distress. HEENT: Normocephalic and atraumatic. Sclera clear.  Neck: Supple. No carotid bruits. No JVD. Heart: *** RRR. Distinct S1 and S2. No murmurs, gallops, or rubs.  Lungs: No increased work of breathing. Clear to ausculation bilaterally. No wheezes, rhonchi, or rales.  Abdomen: Soft, non-distended, and non-tender to palpation.  Extremities: No lower extremity edema.  Radial and distal pedal pulses 2+ and equal bilaterally. Skin: Warm and dry. Neuro: No focal deficits. Psych: Normal affect. Responds appropriately.   Assessment:    No diagnosis found.  Plan:     Disposition: Follow up in ***   Signed, Aline FORBES Door, PA-C  06/06/2024 11:26 AM    [facility]

## 2024-06-09 ENCOUNTER — Ambulatory Visit: Admitting: Internal Medicine

## 2024-06-09 ENCOUNTER — Encounter: Payer: Self-pay | Admitting: Internal Medicine

## 2024-06-09 VITALS — BP 121/82 | HR 77 | Temp 98.3°F | Ht 60.0 in | Wt 175.4 lb

## 2024-06-09 DIAGNOSIS — E039 Hypothyroidism, unspecified: Secondary | ICD-10-CM

## 2024-06-09 DIAGNOSIS — E038 Other specified hypothyroidism: Secondary | ICD-10-CM

## 2024-06-09 DIAGNOSIS — I25111 Atherosclerotic heart disease of native coronary artery with angina pectoris with documented spasm: Secondary | ICD-10-CM

## 2024-06-09 DIAGNOSIS — Z79890 Hormone replacement therapy (postmenopausal): Secondary | ICD-10-CM

## 2024-06-09 DIAGNOSIS — Z23 Encounter for immunization: Secondary | ICD-10-CM

## 2024-06-09 DIAGNOSIS — F411 Generalized anxiety disorder: Secondary | ICD-10-CM

## 2024-06-09 NOTE — Progress Notes (Signed)
 Established Patient Office Visit  Subjective   Patient ID: Donna Davis, female    DOB: 27-Mar-1971  Age: 53 y.o. MRN: [id number]  Chief Complaint  Patient presents with   HFU    Not feeling well. No energy,lethargic. Large abd bruises/knot; also on shoulder/leg.   Follow up Hospitalization  Patient was admitted to [facility] on 05/29/24 and discharged on 06/02/24. She was treated for coronary vasospasm in the setting of known CAD. Evaluation for this include cardiac catheterization. Treatment for this included addition of amlodipine  2.5 mg daily. Telephone follow up was done on 06/03/24 She reports excellent compliance with treatment. She reports this condition is improved.   Please see assessment/plan in problem-based charting for further details of today's visit.   Patient Active Problem List   Diagnosis Date Noted   Primary hypertension 05/29/2024   CAD (coronary artery disease) 04/23/2024   Unstable angina (HCC) 04/20/2024   Syncope and collapse 04/14/2024   Anxiety    Clostridium difficile infection    Family history of malignant neoplasm of gastrointestinal tract    Gallstones    Hepatic cyst    Obesity    PONV (postoperative nausea and vomiting)    Hyperglycemia 04/07/2024   Elevated LDL cholesterol level 04/07/2024   Screening mammogram for breast cancer 04/07/2024   Cardiac murmur 03/19/2024   Chest pain of uncertain etiology 03/19/2024   Frequent PVCs 03/19/2024   Palpitations 03/05/2024   Screening for colon cancer 10/22/2023   Low back pain 10/09/2022   Pain in thoracic spine 10/09/2022   Arthralgia of right temporomandibular joint 08/26/2018   Allergic rhinitis due to allergen 12/19/2016   Bilateral deafness 01/17/2016   Family history of breast cancer in mother 08/26/2015   Obesity (BMI 35.0-39.9 without comorbidity) 02/18/2014   Migraine 12/02/2012   Routine adult health maintenance 09/01/2012   Anxiety state 09/03/2006   Hypothyroidism  07/01/2006   GERD 07/01/2006   IBS 07/01/2006      Objective:     BP 121/82 (BP Location: Left Arm, Patient Position: Sitting, Cuff Size: Normal)   Pulse 77   Temp 98.3 F (36.8 C) (Oral)   Ht 5' (1.524 m)   Wt 175 lb 6.4 oz (79.6 kg)   LMP 12/26/2004   SpO2 100% Comment: RA  BMI 34.26 kg/m  BP Readings from Last 3 Encounters:  06/09/24 121/82  06/02/24 117/64  05/13/24 120/80   Wt Readings from Last 3 Encounters:  06/09/24 175 lb 6.4 oz (79.6 kg)  06/03/24 173 lb 3.2 oz (78.6 kg)  05/29/24 183 lb 13.8 oz (83.4 kg)    Physical Exam Vitals reviewed.  Constitutional:      General: She is not in acute distress.    Appearance: Normal appearance.  Pulmonary:     Effort: Pulmonary effort is normal.  Skin:    Comments: Bruising in stages of healing present over lower abdomen at sites of heparin  injection. Small subcutaneous hematoma noted over left, lower abdomen--TTP. Scattered, healing bruising over right shoulder and lower legs. Right radial access site with appropriate healing bruise.   Neurological:     Mental Status: She is alert. Mental status is at baseline.  Psychiatric:        Mood and Affect: Mood normal.        Behavior: Behavior normal.        Thought Content: Thought content normal.      Assessment & Plan:   Problem List Items Addressed This Visit  Cardiovascular and Mediastinum   CAD (coronary artery disease)   Hospitalized 9/12-9/16 for recurrent chest pain in the setting of recently discovered three-vessel CAD s/p DES to RCA and left circumflex with medical management of mid-LAD disease. Repeat cardiac catheterization was completed during this hospitalization with evidence of RCA vasospasm. Treated with amlodipine  as she has been intolerant of nitrate therapy d/t severe headaches. Metoprolol  XL decreased to 12.5 mg daily. Continues on aspirin  and prasugrel , at least 6 months per previous cardiology dc summary. She is having bruising, likely from  antiplatelet therapy. All appear to be healing. Left lower abdominal bruise appears to be healing, with small, underlying hematoma that is quite uncomfortable. We discussed conservative management and time to heal. Likely to bruise more easily while on DAPT. Crestor  stopped during hospitalization for fatigue. Plan for f/u with lipid clinic to discuss alternative options. She has made significant changes to her diet with recent events, limiting salt, sugar, fried food, and adding vegetables/whole grains. She is eager to meet with a dietician and begin cardiac rehab when cleared to do so. Working on [employer], dispensing as outlined elsewhere. F/u with cardiology 06/19/24.        Endocrine   Hypothyroidism - Primary   TSH stable for several years until last checked during hospitalization 05/30/24. Continues on levothyroxine  25 mcg daily. Having significant fatigue and low energy although many changes to her health and medications over the last two months with discovery of significant CAD s/p DES x2 and coronary vasospasm. Will recheck today and discuss the need for dose adjustments.  Plan -TSH/fT4      Relevant Orders   TSH Rfx on Abnormal to Free T4     Other   RESOLVED: On hormone replacement therapy   Following recent discovery of significant CAD s/p DES x2 and coronary vasopasm, I recommended discontinuation of HRT and stopped prescription in early September. Cherese notes worsening of hot flashes and mood in the first week after stopping but VMS have almost entirely resolved and mood stabilized. Referred to gynecology previously for discussion of alternative pharmacologic options if needed in the future, as she has not tolerated almost all non-hormonal options trialed in the past.      Anxiety state   Chronic. Treated with buspirone  20 mg tid and clonazepam  0.5 mg bid prn. Some worsening of mood after stopping estradiol  as outlined elsewhere but this has significantly improved in the last couple of  weeks and Krystalynn notes her mood is pretty good at this time. Has a lot of stress from her job at baseline, currently not working after recent hospitalization(s) until re-evaluation by cardiology on 10/3. She is interested in talking to our behavioral health team, Renda Pontes, for stress management. I agree this will continue to be very important moving forward with her heart disease. I discussed with Renda today and referral placed.      Relevant Orders   Amb ref to Integrated Behavioral Health   Other Visit Diagnoses       Encounter for immunization       Relevant Orders   Flu vaccine trivalent PF, 6mos and older(Flulaval,Afluria,Fluarix,Fluzone) (Completed)       Return in about 3 months (around 09/08/2024).    Ronnald Sergeant, MD

## 2024-06-09 NOTE — Assessment & Plan Note (Signed)
 Hospitalized 9/12-9/16 for recurrent chest pain in the setting of recently discovered three-vessel CAD s/p DES to RCA and left circumflex with medical management of mid-LAD disease. Repeat cardiac catheterization was completed during this hospitalization with evidence of RCA vasospasm. Treated with amlodipine  as she has been intolerant of nitrate therapy d/t severe headaches. Metoprolol  XL decreased to 12.5 mg daily. Continues on aspirin  and prasugrel , at least 6 months per previous cardiology dc summary. She is having bruising, likely from antiplatelet therapy. All appear to be healing. Left lower abdominal bruise appears to be healing, with small, underlying hematoma that is quite uncomfortable. We discussed conservative management and time to heal. Likely to bruise more easily while on DAPT. Crestor  stopped during hospitalization for fatigue. Plan for f/u with lipid clinic to discuss alternative options. She has made significant changes to her diet with recent events, limiting salt, sugar, fried food, and adding vegetables/whole grains. She is eager to meet with a dietician and begin cardiac rehab when cleared to do so. Working on [employer]ician, dispensing as outlined elsewhere. F/u with cardiology 06/19/24.

## 2024-06-09 NOTE — Assessment & Plan Note (Signed)
 TSH stable for several years until last checked during hospitalization 05/30/24. Continues on levothyroxine  25 mcg daily. Having significant fatigue and low energy although many changes to her health and medications over the last two months with discovery of significant CAD s/p DES x2 and coronary vasospasm. Will recheck today and discuss the need for dose adjustments.  Plan -TSH/fT4

## 2024-06-09 NOTE — Assessment & Plan Note (Addendum)
 Following recent discovery of significant CAD s/p DES x2 and coronary vasopasm, I recommended discontinuation of HRT and stopped prescription in early September. Donna Davis notes worsening of hot flashes and mood in the first week after stopping but VMS have almost entirely resolved and mood stabilized. Referred to gynecology previously for discussion of alternative pharmacologic options if needed in the future, as she has not tolerated almost all non-hormonal options trialed in the past.

## 2024-06-09 NOTE — Assessment & Plan Note (Signed)
 Chronic. Treated with buspirone  20 mg tid and clonazepam  0.5 mg bid prn. Some worsening of mood after stopping estradiol  as outlined elsewhere but this has significantly improved in the last couple of weeks and Donna Davis notes her mood is pretty good at this time. Has a lot of stress from her job at baseline, currently not working after recent hospitalization(s) until re-evaluation by cardiology on 10/3. She is interested in talking to our behavioral health team, Renda Pontes, for stress management. I agree this will continue to be very important moving forward with her heart disease. I discussed with Renda today and referral placed.

## 2024-06-10 ENCOUNTER — Ambulatory Visit: Payer: Self-pay | Admitting: Internal Medicine

## 2024-06-10 ENCOUNTER — Other Ambulatory Visit: Payer: Self-pay

## 2024-06-10 LAB — TSH RFX ON ABNORMAL TO FREE T4: TSH: 2.17 u[IU]/mL (ref 0.450–4.500)

## 2024-06-10 NOTE — Patient Instructions (Signed)
 Visit Information  Thank you for taking time to visit with me today. Please don't hesitate to contact me if I can be of assistance to you before our next scheduled telephone appointment.  Our next appointment is by telephone on Wednesday October 1st at 2:00pm  Following is a copy of your care plan:   Goals Addressed             This Visit's Progress    VBCI Transitions of Care (TOC) Care Plan       Problems: (reviewed 06/10/24) Recent Hospitalization for treatment of Anxiety, CAD, and HTN Hospital or ED Admin Risk is 42%  Goal: (reviewed 06/10/24) Over the next 30 days, the patient will not experience hospital readmission  Interventions: (reviewed 06/10/24)  CAD Interventions: Assessed understanding of CAD diagnosis Medications reviewed including medications utilized in CAD treatment plan Provided education on Importance of limiting foods high in cholesterol Counseled on importance of regular laboratory monitoring as prescribed Reviewed Importance of taking all medications as prescribed Reviewed Importance of attending all scheduled provider appointments Advised to report any changes in symptoms or exercise tolerance Screening for signs and symptoms of depression related to chronic disease state   Hypertension Interventions: Last practice recorded BP readings:  BP Readings from Last 3 Encounters:  06/02/24 117/64  05/13/24 120/80  05/02/24 133/81   Most recent eGFR/CrCl:  Lab Results  Component Value Date   EGFR 104 05/31/2022    No components found for: CRCL  Reviewed medications with patient and discussed importance of compliance Counseled on the importance of exercise goals with target of 150 minutes per week Discussed plans with patient for ongoing care management follow up and provided patient with direct contact information for care management team Reviewed scheduled/upcoming provider appointments including:  Provided education on prescribed diet Heart  Healthy Screening for signs and symptoms of depression related to chronic disease state  Screening for Anxiety related to symptoms related to Anxiety - The patient has some panic attacks and is now on Buspar  and Klonopin  06/10/24 - Referral to Cardiologist and Cardiac Rehab scheduled for October 3rd 06/10/24 - Referral to Behavioral Health for stress and anxiety  Patient Self Care Activities:  Attend all scheduled provider appointments Call pharmacy for medication refills 3-7 days in advance of running out of medications Call provider office for new concerns or questions  Notify RN Care Manager of [facility] call rescheduling needs Participate in Transition of Care Program/Attend [facility] scheduled calls Perform all self care activities independently  Take medications as prescribed    Plan:  Telephone follow up appointment with care management team member scheduled for:  Wednesday October 1st at 2:00pm        Patient verbalizes understanding of instructions and care plan provided today and agrees to view in MyChart. Active MyChart status and patient understanding of how to access instructions and care plan via MyChart confirmed with patient.     The patient has been provided with contact information for the care management team and has been advised to call with any health related questions or concerns.   Please call the care guide team at [phone] if you need to cancel or reschedule your appointment.   Please call the Suicide and Crisis Lifeline: 988 call the [place]  National Suicide Prevention Lifeline: 604-006-2211 or TTY: (646) 321-8700 TTY 534-530-8379) to talk to a trained counselor if you are experiencing a Mental Health or Behavioral Health Crisis or need someone to talk to.  Medford Balboa, BSN, RN [employer]  DESIREE JASMINE Population Health RN Care Manager [phone]

## 2024-06-10 NOTE — Transitions of Care (Post Inpatient/ED Visit) (Signed)
 Transition of Care week 2  Visit Note  06/10/2024  Name: Donna Davis MRN: [id number]          DOB: [birth year]  Situation: Patient enrolled in [facility] 30-day program. Visit completed with Breaunna Dinovo by telephone.   Background:    Past Medical History:  Diagnosis Date   Allergic rhinitis due to allergen 12/19/2016   Anxiety    Anxiety state 09/03/2006   Arthralgia of right temporomandibular joint 08/26/2018   Bilateral deafness 01/17/2016   Cardiac murmur 03/19/2024   Chest pain of uncertain etiology 03/19/2024   Clostridium difficile infection    x several times   Deaf    uses sign language   Elevated LDL cholesterol level 04/07/2024   Family history of breast cancer in mother 08/26/2015   Family history of malignant neoplasm of gastrointestinal tract    Frequent PVCs 03/19/2024   Gallstones    GERD 07/01/2006   Qualifier: Diagnosis of   By: Charmayne MD, Arlyss         Hearing difficulty    b/l, 2/2 congenital rubella s/p stapedectomy. Using hearing aid   Hepatic cyst    6 mm on CT done done in 05/2005   Hyperglycemia 04/07/2024   Hypothyroidism    IBS 07/01/2006   Qualifier: Diagnosis of   By: Charmayne MD, Arlyss         Low back pain 10/09/2022   Migraine 12/02/2012   Obesity    Obesity (BMI 35.0-39.9 without comorbidity) 02/18/2014   On hormone replacement therapy 04/07/2024   Pain in thoracic spine 10/09/2022   Palpitations 03/05/2024   PONV (postoperative nausea and vomiting)    sometimes; depends on how long I'm under   Routine adult health maintenance 09/01/2012   MMG - Normal 2014 - she is due and I reminded her to schedule.   Pelvic - hysterectomy, no cervix.  Sees OBGYN  Flu vaccine seasonally.      DEXA - at 53yo      Screening for colon cancer 10/22/2023   Screening mammogram for breast cancer 04/07/2024    Assessment: Patient Reported Symptoms: Cognitive Cognitive Status: Alert and oriented to person, place, and time      Neurological Neurological  Review of Symptoms: No symptoms reported    HEENT HEENT Symptoms Reported: Not assessed      Cardiovascular Cardiovascular Symptoms Reported: No symptoms reported Cardiovascular Management Strategies: Adequate rest, Medication therapy, Routine screening, Weight management, Diet modification Do You Have a Working Readable Scale?: Yes Weight: 175 lb (79.4 kg)  Respiratory Respiratory Symptoms Reported: No symptoms reported    Endocrine Endocrine Symptoms Reported: No symptoms reported    Gastrointestinal Gastrointestinal Symptoms Reported: No symptoms reported Gastrointestinal Comment: The patient has lost 28 pounds since January    Genitourinary Genitourinary Symptoms Reported: No symptoms reported    Integumentary Integumentary Symptoms Reported: No symptoms reported    Musculoskeletal Musculoskelatal Symptoms Reviewed: Weakness Musculoskeletal Comment: The patient is waiting for clearance from the Cardiologist so she can start cardiac rehab      Psychosocial Psychosocial Symptoms Reported: Not assessed         There were no vitals filed for this visit.  Medications Reviewed Today     Reviewed by Moises Reusing, RN (Case Manager) on 06/10/24 at 1419  Med List Status: <None>   Medication Order Taking? Sig Documenting Provider Last Dose Status Informant  acetaminophen  (TYLENOL ) 500 MG tablet [id number] No Take 1,000 mg by mouth every 6 (six) hours as  needed for mild pain (pain score 1-3) or moderate pain (pain score 4-6). [provider] 05/29/2024 Active Self, Pharmacy Records, Multiple Informants  amLODipine  (NORVASC ) 2.5 MG tablet [id number]  Take 1 tablet (2.5 mg total) by mouth daily. Waymond Cart, MD  Active   Ascorbic Acid (VITAMIN C PO) [id number] No Take 1 tablet by mouth in the morning. [provider] 05/29/2024 Active Self, Pharmacy Records, Multiple Informants           Med Note (GARNER, TIFFANY L   Fri Apr 10, 2024  3:08 PM)    aspirin  EC 81 MG  tablet [id number] No Take 81 mg by mouth daily. Swallow whole. [provider] 05/29/2024 Active Self, Pharmacy Records, Multiple Informants  busPIRone  (BUSPAR ) 10 MG tablet [id number] No TAKE 2 TABLETS BY MOUTH 3 TIMES A DAY Lau, Grace, MD 05/29/2024 Active Self, Pharmacy Records, Multiple Informants           Med Note STEFFI, ADELITA   Fri May 29, 2024  4:54 PM)    clonazePAM  (KLONOPIN ) 0.5 MG tablet [id number] No Take 1 tablet (0.5 mg total) by mouth 2 (two) times daily as needed for anxiety. Joane Artist RAMAN, MD 05/28/2024 Active Self, Pharmacy Records, Multiple Informants           Med Note STEFFI, ALEXANDRIA   Fri May 29, 2024  4:54 PM)    fluticasone  (FLONASE ) 50 MCG/ACT nasal spray [id number] No SPRAY 1 SPRAY INTO EACH NOSTRIL EVERY DAY  Patient taking differently: Place 1 spray into both nostrils in the morning.   [name], MD 05/29/2024 Active Self, Pharmacy Records, Multiple Informants           Med Note STEFFI, ADELITA   Fri May 29, 2024  4:54 PM)    levothyroxine  (SYNTHROID ) 25 MCG tablet [id number] No TAKE 1 TABLET BY MOUTH EVERY DAY  Patient taking differently: Take 25 mcg by mouth in the morning.   [name], MD 05/29/2024 Active Self, Pharmacy Records, Multiple Informants           Med Note STEFFI, ADELITA   Fri May 29, 2024  4:54 PM)    lidocaine  (LIDODERM ) 5 % [id number]  Place 1 patch onto the skin daily. Remove & Discard patch within 12 hours or as directed by MD Waymond Cart, MD  Active   metoprolol  succinate (TOPROL -XL) 25 MG 24 hr tablet [id number]  Take 0.5 tablets (12.5 mg total) by mouth daily. Waymond Cart, MD  Active   nitroGLYCERIN  (NITROSTAT ) 0.4 MG SL tablet [id number] No Place 1 tablet (0.4 mg total) under the tongue every 5 (five) minutes as needed for chest pain. Zhao, Xika, NP 05/29/2024  8:00 AM Active Self, Pharmacy Records, Multiple Informants  pantoprazole  (PROTONIX ) 40 MG tablet [id number] No TAKE 1 TABLET BY MOUTH EVERY DAY [name], MD  05/29/2024 Active Self, Pharmacy Records, Multiple Informants           Med Note STEFFI, ALEXANDRIA   Fri May 29, 2024  4:54 PM)    prasugrel  (EFFIENT ) 10 MG TABS tablet [id number] No Take 1 tablet (10 mg total) by mouth daily. Zhao, Xika, NP 05/29/2024 Active Self, Pharmacy Records, Multiple Informants  promethazine  (PHENERGAN ) 12.5 MG tablet [id number] No Take 1 tablet (12.5 mg total) by mouth every 6 (six) hours as needed for nausea or vomiting. [name], MD Past Week Active Self, Pharmacy Records, Multiple Informants  traMADol  (ULTRAM ) 50 MG tablet [id number]  Take 1 tablet (50 mg total) by mouth every 6 (  six) hours as needed for severe pain (pain score 7-10). [name], MD  Active             Recommendation:   Continue Current Plan of Care  Follow Up Plan:   Telephone follow-up in 1 week  Medford Balboa, BSN, RN [place]  VBCI - [facility] RN Care Manager [phone]

## 2024-06-10 NOTE — Progress Notes (Signed)
 TSH wnl .  Marland Kitchen

## 2024-06-11 ENCOUNTER — Encounter: Payer: Self-pay | Admitting: Student

## 2024-06-13 NOTE — Progress Notes (Unsigned)
 Cardiology Office Note:    Date:  06/13/2024   ID:  Donna Davis, Donna Davis April 06, 1971, MRN [id number]  PCP:  [name], MD  Cardiologist:  Ozell Fell, MD { Click to update primary MD,subspecialty MD or APP then REFRESH:1}    Referring MD: [name], MD   Chief Complaint: hospital follow-up of chest pain   History of Present Illness:    Donna Davis is a 53 y.o. female with a history of multivessel CAD s/p recent DES to RCA and angioplasty/ DES to LCX on 04/22/2024, palpitations with short runs of SVT noted on monitor in 03/2024, hyperlipidemia, hypothyroidism, GERD, IBS, low back pain, anxiety, multiple mediation intolerances, and bilateral deafness who is followed by Dr. Edwyna and presents today for hospital follow-up of chest pain.   Patient was referred to Dr. Revankar on 03/19/2024 for further evaluation of palpitations and chest pain. She brought in an EKG strip that showed frequent PVCs. 2 week Zio monitor, TTE, and stress Echo were ordered for further evaluation. TTE showed LVEF of 65-70% with normal wall motion and grade 1 diastolic dysfunction, normal RV function, and no significant valvular disease. Monitor showed 9 short runs of SVT (longest run 15 beats) and rare PACs/ PVCs but no significant arrhythmias. She had a syncopal episode following removal of this monitor. She was seen by Dr Edwyna again on 04/14/2024. Stress Echo was was still pending at that time. She continued to have palpitations and chest pain but denied any recurrent chest pain. A second 15 day monitor was ordered and she was started on Toprol -XL 25mg  daily. Stress Echo on 04/17/2024 was negative. She was subsequently admitted a couple of days later for persistent chest pain concerning for unstable angina since her stress Echo. LHC showed significant 3 vessel CAD involving the LAD, LCX, and RCA that all appeared hemodynamically significant by virtual FFR. She was seen by CT Surgery and seen by Dr. Shyrl who felt the  LAD lesion was non-critical and felt PCI to RCA and LCX was felt to be the better treatment option. Therefore, she was taken back to the cath lab on 04/22/2024 and underwent successful IVUS guided PCI with DES to RCA and angioplasty and DES to mid to distal LCX.   She was admitted again from 05/29/2024 to 06/02/2024 for recurrent chest pain. She reported chest pressure that radiated to her left arm multiple times throughout the night. Pain was relieved with Nitroglycerin . High-sensitivity troponin was negative. Limited Echo showed LVEF of 55-60%, normal RV function, and no significant valvular disease. She underwent repeat LHC which showed stable appearance of the coronary arteries with widely patent stents. There was a long segment of 50-60% stenosis in the mid to distal LAD that appeared unchanged but was mildly significant by hemodynamic assessment (RFR = 0.90, FFR = 0.75). Microvascular function was normal but coronary vasospasm was noted in the RCA and resolved with intracoronary Nitroglycerin . She was started on Amlodipine  for coronary vasospasms. Crestor  was stopped due to reports of myalgias and she was referred to the [facility].   Patient presents today for follow-up. ***  CAD Coronary Vasospasm  Patient has struggled with recurrent chest pain over the last couple of months. LHC in 04/2024 showed multivessel CAD involving the LAD, LCx, and RCA. She was seen by CT Surgery but PCI was felt to be the better option so she underwent successful IVUS guided PCI with DES to RCA and angioplasty and DES to mid to distal LCX. Repeat LHC  on 06/01/2024 showed stable stable disease with patent stents and evidence of coronary vasospasm in the RCA. Continue Medical therapy was recommended and she was started on Amlodipine .  - *** - Continue Amlodipine  2.5mg  daily.  - Previously unable to tolerate Imdur  due to migraines.  - Continue DAPT with Aspirin  81mg  daily and Effient  10mg  daily.  - Intolerant to statins. Has  been referred to [facility]id Clinic for consideration of PCSK9 inhibitor.    Palpitations Paroxysmal SVT Patient has a history of palpitation. Recent monitor in 03/2024 showed 9 short runs of SVT (longest run 15 beats) and rare PACs/ PVCs but no significant arrhythmias.  - *** - Continue Toprol -XL 12.5 mg daily.   Syncope Patient had a syncopal episode in 03/2024 after removing a Zio monitor which showed no significant arrhythmias. Therefore, a second 2 week monitor was ordered and showed *** Echo in 05/2024 showed normal LV function with no significant valvular disease.  - No recurrent syncope.  - ***  Hyperlipidemia Lipid panel on 04/22/2024: Total Cholesterol 185, Triglycerides 122, HDL 52, LDL 109. LDL goal <70 given CAD. - Recently started on Crestor  in 04/2024 but unable to tolerate this due to myalgias.  - She has been referred to the Lipid Clinic for consideration of PCSK9 inhibitor.    EKGs/Labs/Other Studies Reviewed:    The following studies were reviewed:  Echocardiogram 03/27/2024: Impressions: 1. Left ventricular ejection fraction, by estimation, is 65 to 70%. Left  ventricular ejection fraction by 3D volume is 68 %. The left ventricle has  normal function. The left ventricle has no regional wall motion  abnormalities. Left ventricular diastolic   parameters are consistent with Grade I diastolic dysfunction (impaired  relaxation). The average left ventricular global longitudinal strain is  -20.0 %. The global longitudinal strain is normal.   2. Right ventricular systolic function is normal. The right ventricular  size is normal.   3. The mitral valve is normal in structure. Trivial mitral valve  regurgitation. No evidence of mitral stenosis.   4. The aortic valve is normal in structure. Aortic valve regurgitation is  not visualized. No aortic stenosis is present.   5. The inferior vena cava is dilated in size with >50% respiratory  variability, suggesting right atrial pressure  of 8 mmHg.  _______________   Monitor 03/19/2024 to 04/02/2024: Patient had a min HR of 53 bpm, max HR of 152 bpm, and avg HR of 75 bpm.    Predominant underlying rhythm was Sinus Rhythm.    9 Supraventricular Tachycardia runs occurred, the run with the fastest interval lasting 4 beats with a max rate of 152 bpm, the  longest lasting 15 beats with an avg rate of 97 bpm. Isolated SVEs were rare (<1.0%), SVE Couplets were rare (<1.0%), and SVE Triplets were rare (<1.0%).    Isolated VEs were rare (<1.0%), VE Couplets were rare (<1.0%), and no VE Triplets were present. Ventricular Bigeminy and Trigeminy were present.    Impression: Mildly abnormal but largely unremarkable event monitor.  Brief atrial runs noted. _______________   Echo Stress Test 04/17/2024: Imrpessions: 1. This is a negative stress echocardiogram for ischemia.   2. This is a low risk study.   3. Grossly normal wall motion with stress however image quality is  reduced due to suboptimal acoustic windows for imaging. If symptoms  persist, consider alternate imaging modality for stress.  _______________   Left Cardiac Catheterization 04/20/2024: Conclusions: Significant three-vessel coronary artery disease, as detailed below, including diffuse mid LAD  disease of up to 50-60%, 90% distal LCx lesion, and sequential 60-80% proximal/mid RCA stenoses.  LAD, LCx, and RCA lesions are all hemodynamically significant by virtual FFR. Upper normal left ventricular filling pressure (LVEDP 15 mmHg).   Recommendations: Given ongoing chest pain over the last 3 days, admit for inpatient cardiac surgery consultation and revascularization (CABG versus multivessel PCI).  If the patient is not a good surgical candidate, I would favor PCI to LCx and RCA with medical therapy of LAD given long segment of mid LAD disease. Aggressive secondary prevention of coronary artery disease. Initiate heparin  infusion 2 hours after TR band has been removed.    Diagnostic Dominance: Right  _______________   Coronary Stent Intervention 04/22/2024:   Mid LAD to Dist LAD lesion is 55% stenosed.   Mid RCA lesion is 65% stenosed.   1st Diag lesion is 50% stenosed.   Prox RCA lesion is 75% stenosed.   Dist RCA lesion is 40% stenosed.   Mid Cx to Dist Cx lesion is 90% stenosed.   Mid Cx lesion is 30% stenosed.   A drug-eluting stent was successfully placed using a STENT SYNERGY XD [id number].   A drug-eluting stent was successfully placed using a STENT SYNERGY XD [id number].   Post intervention, there is a 0% residual stenosis.   Post intervention, there is a 0% residual stenosis.   Post intervention, there is a 0% residual stenosis.   1.  Mildly elevated left ventricular end-diastolic pressure at 18 mmHg. 2.  Successful IVUS guided PCI and drug-eluting stent placement to the right coronary artery extending to the ostium.  A 2.5 mm stent was placed and postdilated to a 3.0 proximally. 3.  Successful angioplasty and drug-eluting stent placement to the mid/distal left circumflex.  The stent partially jailed in OM branch which had normal flow and minimal stenosis at the ostium. 4.  The patient required high doses of sedatives and narcotics.   Recommendations: Dual antiplatelet therapy for at least 6 months. Aggressive treatment of risk factors.   Diagnostic Dominance: Right  Intervention    _______________  Limited Echocardiogram 05/31/2024: 1. Left ventricular ejection fraction, by estimation, is 55 to 60%. The  left ventricle has normal function. Left ventricular endocardial border  not optimally defined to evaluate regional wall motion.   2. Right ventricular systolic function is normal. The right ventricular  size is normal. Tricuspid regurgitation signal is inadequate for assessing  PA pressure.   3. The mitral valve is normal in structure. No evidence of mitral valve  regurgitation. No evidence of mitral stenosis.   4. The aortic valve was  not well visualized. Aortic valve regurgitation  is not visualized. No aortic stenosis is present.   5. The inferior vena cava is normal in size with greater than 50%  respiratory variability, suggesting right atrial pressure of 3 mmHg.  _______________  Left Cardiac Catheterization 06/01/2024: Conclusions: Stable appearance of the coronary arteries following recent PCI's to the LCx and RCA with widely patent stents.  Long segment of 50-60% stenosis in the mid/distal LAD appears unchanged and is mildly significant by hemodynamic assessment (RFR = 0.90, FFR = 0.75). Normal microvascular function (CFR 5.8, IMR 6). Coronary vasospasm of the RCA noted, which resolved with intracoronary nitroglycerin . Low left ventricular filling pressure (LVEDP 3 mmHg).   Recommendations: Continue dual antiplatelet therapy with aspirin  and prasugrel  for 12 months from PCI's to LCx and RCA in 04/2024. Add amlodipine  for treatment of coronary vasospasm, as the patient has been intolerant  to long-acting nitrates. Aggressive secondary prevention of coronary artery disease. If severe chest pain at rest persists, consider further evaluation for alternative etiologies of chest pain, which seems out of proportion to her burden of CAD.  Diagnostic Dominance: Right     EKG:  EKG not ordered today.   Recent Labs: 03/05/2024: Magnesium  2.0 05/29/2024: ALT 15 06/02/2024: BUN 8; Creatinine, Ser 0.87; Hemoglobin 12.7; Platelets 234; Potassium 3.6; Sodium 135 06/09/2024: TSH 2.170  Recent Lipid Panel    Component Value Date/Time   CHOL 185 04/22/2024 0331   TRIG 122 04/22/2024 0331   HDL 52 04/22/2024 0331   CHOLHDL 3.6 04/22/2024 0331   VLDL 24 04/22/2024 0331   LDLCALC 109 (H) 04/22/2024 0331    Physical Exam:    Vital Signs: LMP 12/26/2004     Wt Readings from Last 3 Encounters:  06/10/24 175 lb (79.4 kg)  06/09/24 175 lb 6.4 oz (79.6 kg)  06/03/24 173 lb 3.2 oz (78.6 kg)     General: 53 y.o. female in  no acute distress. HEENT: Normocephalic and atraumatic. Sclera clear.  Neck: Supple. No carotid bruits. No JVD. Heart: *** RRR. Distinct S1 and S2. No murmurs, gallops, or rubs.  Lungs: No increased work of breathing. Clear to ausculation bilaterally. No wheezes, rhonchi, or rales.  Abdomen: Soft, non-distended, and non-tender to palpation.  Extremities: No lower extremity edema.  Radial and distal pedal pulses 2+ and equal bilaterally. Skin: Warm and dry. Neuro: No focal deficits. Psych: Normal affect. Responds appropriately.   Assessment:    No diagnosis found.  Plan:     Disposition: Follow up in ***   Signed, Aline FORBES Door, PA-C  06/13/2024 9:43 AM    [facility]

## 2024-06-17 ENCOUNTER — Telehealth

## 2024-06-17 ENCOUNTER — Other Ambulatory Visit: Payer: Self-pay

## 2024-06-17 NOTE — Transitions of Care (Post Inpatient/ED Visit) (Signed)
 Transition of Care week 3  Visit Note  06/17/2024  Name: Donna Davis MRN: [id number]          DOB: [birth year]  Situation: Patient enrolled in [facility] 30-day program. Visit completed with Janit Hockenbury by telephone.   Background:   Past Medical History:  Diagnosis Date   Allergic rhinitis due to allergen 12/19/2016   Anxiety    Anxiety state 09/03/2006   Arthralgia of right temporomandibular joint 08/26/2018   Bilateral deafness 01/17/2016   Cardiac murmur 03/19/2024   Chest pain of uncertain etiology 03/19/2024   Clostridium difficile infection    x several times   Deaf    uses sign language   Elevated LDL cholesterol level 04/07/2024   Family history of breast cancer in mother 08/26/2015   Family history of malignant neoplasm of gastrointestinal tract    Frequent PVCs 03/19/2024   Gallstones    GERD 07/01/2006   Qualifier: Diagnosis of   By: Charmayne MD, Arlyss         Hearing difficulty    b/l, 2/2 congenital rubella s/p stapedectomy. Using hearing aid   Hepatic cyst    6 mm on CT done done in 05/2005   Hyperglycemia 04/07/2024   Hypothyroidism    IBS 07/01/2006   Qualifier: Diagnosis of   By: Charmayne MD, Arlyss         Low back pain 10/09/2022   Migraine 12/02/2012   Obesity    Obesity (BMI 35.0-39.9 without comorbidity) 02/18/2014   On hormone replacement therapy 04/07/2024   Pain in thoracic spine 10/09/2022   Palpitations 03/05/2024   PONV (postoperative nausea and vomiting)    sometimes; depends on how long I'm under   Routine adult health maintenance 09/01/2012   MMG - Normal 2014 - she is due and I reminded her to schedule.   Pelvic - hysterectomy, no cervix.  Sees OBGYN  Flu vaccine seasonally.      DEXA - at 53yo      Screening for colon cancer 10/22/2023   Screening mammogram for breast cancer 04/07/2024    Assessment: Patient Reported Symptoms: Cognitive Cognitive Status: Alert and oriented to person, place, and time, Normal speech and language skills       Neurological Neurological Review of Symptoms: No symptoms reported    HEENT HEENT Symptoms Reported: No symptoms reported      Cardiovascular Cardiovascular Symptoms Reported: Chest pain or discomfort, Lightheadness Other Cardiovascular Symptoms: Nusea Does patient have uncontrolled Hypertension?: No Cardiovascular Management Strategies: Adequate rest, Medication therapy, Routine screening, Diet modification Cardiovascular Comment: The patient states she is having chest discomfort and has been taking her Nitroglycerin  tablets. She has nausea and is taking her Phenergan . She called the Cardiologist and the soonest appointment is for Friday 10/3. Encouraged her to go to the ER if her symptoms persisted. She states this make her anxious when her heart has the vasospasm  Respiratory Respiratory Symptoms Reported: No symptoms reported    Endocrine Endocrine Symptoms Reported: No symptoms reported    Gastrointestinal Gastrointestinal Symptoms Reported: Nausea Additional Gastrointestinal Details: Nausea is related to Vasospasm Gastrointestinal Management Strategies: Medication therapy    Genitourinary Genitourinary Symptoms Reported: No symptoms reported    Integumentary Integumentary Symptoms Reported: No symptoms reported    Musculoskeletal Musculoskelatal Symptoms Reviewed: Weakness Musculoskeletal Comment: The patient will see the Cardiologist on 10/3 and would like to attend Cardiac Rehab      Psychosocial Psychosocial Symptoms Reported: Anxiety - if selected complete GAD  There were no vitals filed for this visit.  Medications Reviewed Today     Reviewed by Moises Reusing, RN (Case Manager) on 06/17/24 at 1426  Med List Status: <None>   Medication Order Taking? Sig Documenting Provider Last Dose Status Informant  acetaminophen  (TYLENOL ) 500 MG tablet [id number] No Take 1,000 mg by mouth every 6 (six) hours as needed for mild pain (pain score 1-3) or moderate pain  (pain score 4-6). [provider] 05/29/2024 Active Self, Pharmacy Records, Multiple Informants  amLODipine  (NORVASC ) 2.5 MG tablet [id number]  Take 1 tablet (2.5 mg total) by mouth daily. Waymond Cart, MD  Active   Ascorbic Acid (VITAMIN C PO) [id number] No Take 1 tablet by mouth in the morning. [provider] 05/29/2024 Active Self, Pharmacy Records, Multiple Informants           Med Note (GARNER, TIFFANY L   Fri Apr 10, 2024  3:08 PM)    aspirin  EC 81 MG tablet [id number] No Take 81 mg by mouth daily. Swallow whole. [provider] 05/29/2024 Active Self, Pharmacy Records, Multiple Informants  busPIRone  (BUSPAR ) 10 MG tablet [id number] No TAKE 2 TABLETS BY MOUTH 3 TIMES A DAY Lau, Grace, MD 05/29/2024 Active Self, Pharmacy Records, Multiple Informants           Med Note STEFFI, ALEXANDRIA   Fri May 29, 2024  4:54 PM)    clonazePAM  (KLONOPIN ) 0.5 MG tablet [id number] No Take 1 tablet (0.5 mg total) by mouth 2 (two) times daily as needed for anxiety. Joane Artist RAMAN, MD 05/28/2024 Active Self, Pharmacy Records, Multiple Informants           Med Note STEFFI, ADELITA   Fri May 29, 2024  4:54 PM)    fluticasone  (FLONASE ) 50 MCG/ACT nasal spray [id number] No SPRAY 1 SPRAY INTO EACH NOSTRIL EVERY DAY  Patient taking differently: Place 1 spray into both nostrils in the morning.   [name], MD 05/29/2024 Active Self, Pharmacy Records, Multiple Informants           Med Note STEFFI, ADELITA   Fri May 29, 2024  4:54 PM)    levothyroxine  (SYNTHROID ) 25 MCG tablet [id number] No TAKE 1 TABLET BY MOUTH EVERY DAY  Patient taking differently: Take 25 mcg by mouth in the morning.   [name], MD 05/29/2024 Active Self, Pharmacy Records, Multiple Informants           Med Note STEFFI, ADELITA   Fri May 29, 2024  4:54 PM)    lidocaine  (LIDODERM ) 5 % [id number]  Place 1 patch onto the skin daily. Remove & Discard patch within 12 hours or as directed by MD Waymond Cart, MD  Active    metoprolol  succinate (TOPROL -XL) 25 MG 24 hr tablet [id number]  Take 0.5 tablets (12.5 mg total) by mouth daily. Waymond Cart, MD  Active   nitroGLYCERIN  (NITROSTAT ) 0.4 MG SL tablet [id number] No Place 1 tablet (0.4 mg total) under the tongue every 5 (five) minutes as needed for chest pain. Zhao, Xika, NP 05/29/2024  8:00 AM Active Self, Pharmacy Records, Multiple Informants  pantoprazole  (PROTONIX ) 40 MG tablet [id number] No TAKE 1 TABLET BY MOUTH EVERY DAY [name], MD 05/29/2024 Active Self, Pharmacy Records, Multiple Informants           Med Note STEFFI, ALEXANDRIA   Fri May 29, 2024  4:54 PM)    prasugrel  (EFFIENT ) 10 MG TABS tablet [id number] No Take 1 tablet (10 mg total) by mouth daily. Zhao, Xika, NP  05/29/2024 Active Self, Pharmacy Records, Multiple Informants  promethazine  (PHENERGAN ) 12.5 MG tablet [id number] No Take 1 tablet (12.5 mg total) by mouth every 6 (six) hours as needed for nausea or vomiting. [name], MD Past Week Active Self, Pharmacy Records, Multiple Informants  traMADol  (ULTRAM ) 50 MG tablet [id number]  Take 1 tablet (50 mg total) by mouth every 6 (six) hours as needed for severe pain (pain score 7-10). [name], MD  Active             Recommendation:   Go to the ED if chest pain persists. Declining at this time  Follow Up Plan:   Telephone follow-up in 1 day Then follow up next week  Medford Balboa, BSN, RN [place]  VBCI - [facility] RN Care Manager [phone]

## 2024-06-17 NOTE — Patient Instructions (Signed)
 Visit Information  Thank you for taking time to visit with me today. Please don't hesitate to contact me if I can be of assistance to you before our next scheduled telephone appointment.  Following is a copy of your care plan:   Goals Addressed             This Visit's Progress    VBCI Transitions of Care (TOC) Care Plan       Problems: (06/17/2024) Recent Hospitalization for treatment of Anxiety, CAD, and HTN Hospital or ED Admin Risk is 42%  Goal:  (06/17/2024) Over the next 30 days, the patient will not experience hospital readmission  Interventions:  (06/17/2024)  CAD Interventions: Assessed understanding of CAD diagnosis Medications reviewed including medications utilized in CAD treatment plan Provided education on Importance of limiting foods high in cholesterol Counseled on importance of regular laboratory monitoring as prescribed Reviewed Importance of taking all medications as prescribed Reviewed Importance of attending all scheduled provider appointments Advised to report any changes in symptoms or exercise tolerance Screening for signs and symptoms of depression related to chronic disease state Take Nitroglycerin  .4mg  tablets SL every 5 minutes as needed for chest pain Take Clonazepam  .5mg  twice daily PRN for anxiety  Hypertension Interventions: (06/17/2024) Last practice recorded BP readings:  BP Readings from Last 3 Encounters:  06/02/24 117/64  05/13/24 120/80  05/02/24 133/81   Most recent eGFR/CrCl:  Lab Results  Component Value Date   EGFR 104 05/31/2022    No components found for: CRCL  Reviewed medications with patient and discussed importance of compliance Counseled on the importance of exercise goals with target of 150 minutes per week Discussed plans with patient for ongoing care management follow up and provided patient with direct contact information for care management team Reviewed scheduled/upcoming provider appointments including:  Provided  education on prescribed diet Heart Healthy Screening for signs and symptoms of depression related to chronic disease state  Screening for Anxiety related to symptoms related to Anxiety - The patient has some panic attacks and is now on Buspar  and Klonopin  06/10/24 - Referral to Cardiologist and Cardiac Rehab scheduled for October 3rd 06/10/24 - Referral to Behavioral Health for stress and anxiety  Patient Self Care Activities: (06/17/2024) Attend all scheduled provider appointments Call pharmacy for medication refills 3-7 days in advance of running out of medications Call provider office for new concerns or questions  Notify RN Care Manager of [facility] call rescheduling needs Participate in Transition of Care Program/Attend [facility] scheduled calls Perform all self care activities independently  Take medications as prescribed    Plan:  Telephone follow up appointment with care management team member scheduled for:  Wednesday October 8that 2:00pm        Patient verbalizes understanding of instructions and care plan provided today and agrees to view in MyChart. Active MyChart status and patient understanding of how to access instructions and care plan via MyChart confirmed with patient.     The patient has been provided with contact information for the care management team and has been advised to call with any health related questions or concerns.   Please call the care guide team at [phone] if you need to cancel or reschedule your appointment.   Please call the Suicide and Crisis Lifeline: 988 call the [place]  National Suicide Prevention Lifeline: 614-440-3223 or TTY: 618-815-6527 TTY (984)303-2740) to talk to a trained counselor if you are experiencing a Mental Health or Behavioral Health Crisis or need someone to talk to.  [place]  Juanpablo Ciresi, BSN, RN [employer] [facility]  VBCI - Population Health RN Care Manager [phone]

## 2024-06-18 ENCOUNTER — Other Ambulatory Visit: Payer: Self-pay

## 2024-06-18 NOTE — Transitions of Care (Post Inpatient/ED Visit) (Signed)
 06/18/2024  Patient ID: Donna Davis, female   DOB: [birth year], 53 y.o.   MRN: [id number]  Follow up phone call to the patient to assess her anxiety level and how her vasospasms is. She reports that she is still having the spasms and chest discomfort and anxiety. She did take some Clonazepam  which helped her relax a little and she did sleep some. She is just anxious to get to the Cardiologist. Long discussion regarding diet and nutritional resources. The patient is focused on her health. She would like to start exercising.   Medford Balboa, BSN, RN [place]  VBCI - [employer] [facility] RN Care Manager [phone]

## 2024-06-19 ENCOUNTER — Other Ambulatory Visit: Payer: Self-pay

## 2024-06-19 ENCOUNTER — Ambulatory Visit: Attending: Student | Admitting: Student

## 2024-06-19 ENCOUNTER — Other Ambulatory Visit (HOSPITAL_COMMUNITY): Payer: Self-pay

## 2024-06-19 ENCOUNTER — Encounter: Payer: Self-pay | Admitting: Student

## 2024-06-19 ENCOUNTER — Ambulatory Visit: Admitting: Student

## 2024-06-19 VITALS — BP 125/74 | HR 73 | Ht 60.0 in | Wt 171.0 lb

## 2024-06-19 DIAGNOSIS — R55 Syncope and collapse: Secondary | ICD-10-CM

## 2024-06-19 DIAGNOSIS — R002 Palpitations: Secondary | ICD-10-CM | POA: Diagnosis not present

## 2024-06-19 DIAGNOSIS — R079 Chest pain, unspecified: Secondary | ICD-10-CM | POA: Diagnosis not present

## 2024-06-19 DIAGNOSIS — R5383 Other fatigue: Secondary | ICD-10-CM

## 2024-06-19 DIAGNOSIS — I25110 Atherosclerotic heart disease of native coronary artery with unstable angina pectoris: Secondary | ICD-10-CM | POA: Diagnosis not present

## 2024-06-19 DIAGNOSIS — T148XXA Other injury of unspecified body region, initial encounter: Secondary | ICD-10-CM

## 2024-06-19 DIAGNOSIS — I201 Angina pectoris with documented spasm: Secondary | ICD-10-CM

## 2024-06-19 DIAGNOSIS — I471 Supraventricular tachycardia, unspecified: Secondary | ICD-10-CM

## 2024-06-19 DIAGNOSIS — T148XXD Other injury of unspecified body region, subsequent encounter: Secondary | ICD-10-CM

## 2024-06-19 DIAGNOSIS — E785 Hyperlipidemia, unspecified: Secondary | ICD-10-CM

## 2024-06-19 DIAGNOSIS — R0683 Snoring: Secondary | ICD-10-CM

## 2024-06-19 MED ORDER — PANTOPRAZOLE SODIUM 40 MG PO TBEC
40.0000 mg | DELAYED_RELEASE_TABLET | Freq: Every day | ORAL | 3 refills | Status: AC
  Filled 2024-06-19 – 2024-08-24 (×3): qty 90, 90d supply, fill #0

## 2024-06-19 MED ORDER — BUSPIRONE HCL 10 MG PO TABS
20.0000 mg | ORAL_TABLET | Freq: Three times a day (TID) | ORAL | 2 refills | Status: AC
  Filled 2024-06-19 – 2024-08-24 (×2): qty 540, 90d supply, fill #0

## 2024-06-19 MED ORDER — FLUTICASONE PROPIONATE 50 MCG/ACT NA SUSP
1.0000 | Freq: Every day | NASAL | 2 refills | Status: DC
  Filled 2024-06-19 – 2024-06-22 (×2): qty 16, 60d supply, fill #0

## 2024-06-19 MED ORDER — CLONAZEPAM 0.5 MG PO TABS
0.5000 mg | ORAL_TABLET | Freq: Two times a day (BID) | ORAL | 4 refills | Status: DC | PRN
  Filled 2024-06-19 (×2): qty 60, 30d supply, fill #0
  Filled 2024-08-04: qty 60, 30d supply, fill #1

## 2024-06-19 MED ORDER — PRASUGREL HCL 10 MG PO TABS
10.0000 mg | ORAL_TABLET | Freq: Every day | ORAL | 2 refills | Status: DC
  Filled 2024-06-19: qty 30, 30d supply, fill #0
  Filled 2024-07-21: qty 30, 30d supply, fill #1

## 2024-06-19 MED ORDER — AMLODIPINE BESYLATE 5 MG PO TABS
5.0000 mg | ORAL_TABLET | Freq: Every day | ORAL | 3 refills | Status: DC
  Filled 2024-06-19 – 2024-06-22 (×2): qty 30, 30d supply, fill #0

## 2024-06-19 NOTE — Patient Instructions (Signed)
 Thank you for choosing [facility]!     Medication Instructions:  Increase the Amlodipine  to 5mg . Take this tablet once daily. *If you need a refill on your cardiac medications before your next appointment, please call your pharmacy*   Lab Work: CBC If you have labs (blood work) drawn today and your tests are completely normal, you will receive your results only by: MyChart Message (if you have MyChart) OR A paper copy in the mail If you have any lab test that is abnormal or we need to change your treatment, we will call you to review the results.   Testing/Procedures: No procedures were ordered during today's visit.   Your next appointment:   2 week(s)   Provider:   Aline Door, PA-C           Follow-Up: At [facility], you and your health needs are our priority.  As part of our continuing mission to provide you with exceptional heart care, we have created designated Provider Care Teams.  These Care Teams include your primary Cardiologist (physician) and Advanced Practice Providers (APPs -  Physician Assistants and Nurse Practitioners) who all work together to provide you with the care you need, when you need it. We recommend signing up for the patient portal called MyChart.  Sign up information is provided on this After Visit Summary.  MyChart is used to connect with patients for Virtual Visits (Telemedicine).  Patients are able to view lab/test results, encounter notes, upcoming appointments, etc.  Non-urgent messages can be sent to your provider as well.   To learn more about what you can do with MyChart, go to [email or web address].

## 2024-06-20 LAB — CBC
Hematocrit: 40.4 % (ref 34.0–46.6)
Hemoglobin: 13.4 g/dL (ref 11.1–15.9)
MCH: 30.6 pg (ref 26.6–33.0)
MCHC: 33.2 g/dL (ref 31.5–35.7)
MCV: 92 fL (ref 79–97)
Platelets: 291 x10E3/uL (ref 150–450)
RBC: 4.38 x10E6/uL (ref 3.77–5.28)
RDW: 12.4 % (ref 11.7–15.4)
WBC: 6.7 x10E3/uL (ref 3.4–10.8)

## 2024-06-21 ENCOUNTER — Ambulatory Visit: Payer: Self-pay | Admitting: Student

## 2024-06-22 ENCOUNTER — Other Ambulatory Visit: Payer: Self-pay

## 2024-06-22 ENCOUNTER — Other Ambulatory Visit (HOSPITAL_COMMUNITY): Payer: Self-pay

## 2024-06-24 ENCOUNTER — Other Ambulatory Visit: Payer: Self-pay

## 2024-06-24 NOTE — Progress Notes (Signed)
 Cardiology Office Note:    Date:  06/30/2024   ID:  Donna Davis, Donna Davis 18-Sep-1970, MRN [id number]  PCP:  [name], MD  Cardiologist:  Ozell Fell, MD     Referring MD: [name], MD   Chief Complaint: follow-up of chest pain  History of Present Illness:    Donna Davis is a 53 y.o. female with a history of multivessel CAD s/p recent DES to RCA and angioplasty/ DES to LCX in 04/2024, palpitations with short runs of SVT noted on monitor in 03/2024, hyperlipidemia, hypothyroidism, GERD, IBS, low back pain, anxiety, multiple mediation intolerances, and bilateral deafness who is followed by Dr. Edwyna and presents today for follow-up of chest pain.   Patient was referred to Dr. Revankar in 03/2024 for further evaluation of palpitations and chest pain. She brought in an EKG strip that showed frequent PVCs. 2 week Zio monitor, TTE, and stress Echo were ordered for further evaluation. TTE showed LVEF of 65-70% with normal wall motion and grade 1 diastolic dysfunction, normal RV function, and no significant valvular disease. Monitor showed 9 short runs of SVT (longest run 15 beats) and rare PACs/ PVCs but no significant arrhythmias. She had a syncopal episode following removal of this monitor so a 2nd monitor ws ordered. Stress Echo on 04/17/2024 was negative. She was subsequently admitted a couple of days later for persistent chest pain since her stress Echo concerning for unstable angina. LHC showed significant 3 vessel CAD involving the LAD, LCX, and RCA that all appeared hemodynamically significant by virtual FFR. She was seen by CT Surgery and seen by Dr. Shyrl who felt the LAD lesion was non-critical and felt PCI to RCA and LCX was felt to be the better treatment option. Therefore, she was taken back to the cath lab on 04/22/2024 and underwent successful IVUS guided PCI with DES to RCA and angioplasty and DES to mid to distal LCX.    She was admitted again from 05/2024 for recurrent chest pain.  She reported chest pressure that radiated to her left arm multiple times throughout the night. Pain was relieved with Nitroglycerin . High-sensitivity troponin was negative. Limited Echo showed LVEF of 55-60%, normal RV function, and no significant valvular disease. She underwent repeat LHC which showed stable appearance of the coronary arteries with widely patent stents. There was a long segment of 50-60% stenosis in the mid to distal LAD that appeared unchanged but was mildly significant by hemodynamic assessment (RFR = 0.90, FFR = 0.75). Microvascular function was normal but coronary vasospasm was noted in the RCA and resolved with intracoronary Nitroglycerin . She was started on Amlodipine  2.5mg  daily for coronary vasospasms. Crestor  was stopped due to reports of myalgias and she was referred to the Lipid Clinic.   She was last seen by me on 06/19/2024 at which time she reported consistent chest pain that she described as spasms since most recent discharge. EKG showed no acute ischemic changes. Amlodipine  was increased. Itmar sleep study was also ordered given reports of significant fatigue and snoring.   Patient presents today for follow-up. Unfortunately, she really has not had any significant improvement with the increased dose of Amlodipine . She states she had 2 days where the spasm were tolerable but continues to have chest pain basically all day every day. During episodes of severe pain, she gets nauseous and has some mild shortness of breath where she feels it is difficult to take a full breath. She also feels like these episodes just zap her  energy. Symptoms continue to wax and wane but sound overall unchanged from last visit. She denies any orthopnea or PND. She did have an episode of heart racing earlier today and then felt like her heart was skipping beats for a while. No significant dizziness. No recurrent syncope. She states she did feel like she had increased brain fog for the first 2 days  after increasing the Amlodipine  but then this improved some. She also reports some constipation since last admission.   EKGs/Labs/Other Studies Reviewed:    The following studies were reviewed:  Echocardiogram 03/27/2024: Impressions: 1. Left ventricular ejection fraction, by estimation, is 65 to 70%. Left  ventricular ejection fraction by 3D volume is 68 %. The left ventricle has  normal function. The left ventricle has no regional wall motion  abnormalities. Left ventricular diastolic   parameters are consistent with Grade I diastolic dysfunction (impaired  relaxation). The average left ventricular global longitudinal strain is  -20.0 %. The global longitudinal strain is normal.   2. Right ventricular systolic function is normal. The right ventricular  size is normal.   3. The mitral valve is normal in structure. Trivial mitral valve  regurgitation. No evidence of mitral stenosis.   4. The aortic valve is normal in structure. Aortic valve regurgitation is  not visualized. No aortic stenosis is present.   5. The inferior vena cava is dilated in size with >50% respiratory  variability, suggesting right atrial pressure of 8 mmHg.  _______________   Monitor 03/19/2024 to 04/02/2024: Patient had a min HR of 53 bpm, max HR of 152 bpm, and avg HR of 75 bpm.    Predominant underlying rhythm was Sinus Rhythm.    9 Supraventricular Tachycardia runs occurred, the run with the fastest interval lasting 4 beats with a max rate of 152 bpm, the  longest lasting 15 beats with an avg rate of 97 bpm. Isolated SVEs were rare (<1.0%), SVE Couplets were rare (<1.0%), and SVE Triplets were rare (<1.0%).    Isolated VEs were rare (<1.0%), VE Couplets were rare (<1.0%), and no VE Triplets were present. Ventricular Bigeminy and Trigeminy were present.    Impression: Mildly abnormal but largely unremarkable event monitor.  Brief atrial runs noted. _______________   Echo Stress Test  04/17/2024: Imrpessions: 1. This is a negative stress echocardiogram for ischemia.   2. This is a low risk study.   3. Grossly normal wall motion with stress however image quality is  reduced due to suboptimal acoustic windows for imaging. If symptoms  persist, consider alternate imaging modality for stress.  _______________   Left Cardiac Catheterization 04/20/2024: Conclusions: Significant three-vessel coronary artery disease, as detailed below, including diffuse mid LAD disease of up to 50-60%, 90% distal LCx lesion, and sequential 60-80% proximal/mid RCA stenoses.  LAD, LCx, and RCA lesions are all hemodynamically significant by virtual FFR. Upper normal left ventricular filling pressure (LVEDP 15 mmHg).   Recommendations: Given ongoing chest pain over the last 3 days, admit for inpatient cardiac surgery consultation and revascularization (CABG versus multivessel PCI).  If the patient is not a good surgical candidate, I would favor PCI to LCx and RCA with medical therapy of LAD given long segment of mid LAD disease. Aggressive secondary prevention of coronary artery disease. Initiate heparin  infusion 2 hours after TR band has been removed.   Diagnostic Dominance: Right  _______________   Coronary Stent Intervention 04/22/2024:   Mid LAD to Dist LAD lesion is 55% stenosed.   Mid RCA lesion  is 65% stenosed.   1st Diag lesion is 50% stenosed.   Prox RCA lesion is 75% stenosed.   Dist RCA lesion is 40% stenosed.   Mid Cx to Dist Cx lesion is 90% stenosed.   Mid Cx lesion is 30% stenosed.   A drug-eluting stent was successfully placed using a STENT SYNERGY XD [id number].   A drug-eluting stent was successfully placed using a STENT SYNERGY XD [id number].   Post intervention, there is a 0% residual stenosis.   Post intervention, there is a 0% residual stenosis.   Post intervention, there is a 0% residual stenosis.   1.  Mildly elevated left ventricular end-diastolic pressure at 18 mmHg. 2.   Successful IVUS guided PCI and drug-eluting stent placement to the right coronary artery extending to the ostium.  A 2.5 mm stent was placed and postdilated to a 3.0 proximally. 3.  Successful angioplasty and drug-eluting stent placement to the mid/distal left circumflex.  The stent partially jailed in OM branch which had normal flow and minimal stenosis at the ostium. 4.  The patient required high doses of sedatives and narcotics.   Recommendations: Dual antiplatelet therapy for at least 6 months. Aggressive treatment of risk factors.   Diagnostic Dominance: Right  Intervention    _______________   Limited Echocardiogram 05/31/2024: 1. Left ventricular ejection fraction, by estimation, is 55 to 60%. The  left ventricle has normal function. Left ventricular endocardial border  not optimally defined to evaluate regional wall motion.   2. Right ventricular systolic function is normal. The right ventricular  size is normal. Tricuspid regurgitation signal is inadequate for assessing  PA pressure.   3. The mitral valve is normal in structure. No evidence of mitral valve  regurgitation. No evidence of mitral stenosis.   4. The aortic valve was not well visualized. Aortic valve regurgitation  is not visualized. No aortic stenosis is present.   5. The inferior vena cava is normal in size with greater than 50%  respiratory variability, suggesting right atrial pressure of 3 mmHg.  _______________   Left Cardiac Catheterization 06/01/2024: Conclusions: Stable appearance of the coronary arteries following recent PCI's to the LCx and RCA with widely patent stents.  Long segment of 50-60% stenosis in the mid/distal LAD appears unchanged and is mildly significant by hemodynamic assessment (RFR = 0.90, FFR = 0.75). Normal microvascular function (CFR 5.8, IMR 6). Coronary vasospasm of the RCA noted, which resolved with intracoronary nitroglycerin . Low left ventricular filling pressure (LVEDP 3  mmHg).   Recommendations: Continue dual antiplatelet therapy with aspirin  and prasugrel  for 12 months from PCI's to LCx and RCA in 04/2024. Add amlodipine  for treatment of coronary vasospasm, as the patient has been intolerant to long-acting nitrates. Aggressive secondary prevention of coronary artery disease. If severe chest pain at rest persists, consider further evaluation for alternative etiologies of chest pain, which seems out of proportion to her burden of CAD.   Diagnostic Dominance: Right     EKG:  EKG not ordered today.   Recent Labs: 03/05/2024: Magnesium  2.0 05/29/2024: ALT 15 06/02/2024: BUN 8; Creatinine, Ser 0.87; Potassium 3.6; Sodium 135 06/09/2024: TSH 2.170 06/19/2024: Hemoglobin 13.4; Platelets 291  Recent Lipid Panel    Component Value Date/Time   CHOL 185 04/22/2024 0331   TRIG 122 04/22/2024 0331   HDL 52 04/22/2024 0331   CHOLHDL 3.6 04/22/2024 0331   VLDL 24 04/22/2024 0331   LDLCALC 109 (H) 04/22/2024 0331    Physical Exam:    Vital Signs:  BP 125/79   Pulse 72   Ht 5' (1.524 m)   Wt 174 lb (78.9 kg)   LMP 12/26/2004   SpO2 98%   BMI 33.98 kg/m     Wt Readings from Last 3 Encounters:  06/30/24 174 lb (78.9 kg)  06/25/24 176 lb 9.6 oz (80.1 kg)  06/19/24 171 lb (77.6 kg)     General: 53 y.o. female in no acute distress. HEENT: Normocephalic and atraumatic. Sclera clear.  Neck: Supple. No carotid bruits. No JVD. Heart: RRR. Distinct S1 and S2. No murmurs, gallops, or rubs.  Lungs: No increased work of breathing. Clear to ausculation bilaterally. No wheezes, rhonchi, or rales.  Extremities: No lower extremity edema.   Skin: Warm and dry. Neuro: No focal deficits. Psych: Normal affect. Responds appropriately.  Assessment:    1. Chest pain of uncertain etiology   2. Coronary artery disease involving native coronary artery of native heart without angina pectoris   3. Coronary vasospasm   4. Palpitations   5. Paroxysmal SVT  (supraventricular tachycardia)   6. Syncope and collapse   7. Hyperlipidemia, unspecified hyperlipidemia type   8. Other fatigue   9. Snoring     Plan:     Chest Pain CAD Coronary Vasospasm  Patient has struggled with recurrent chest pain over the last couple of months. LHC in 04/2024 showed multivessel CAD involving the LAD, LCx, and RCA. She was seen by CT Surgery but PCI was felt to be the better option so she underwent successful IVUS guided PCI with DES to RCA and angioplasty and DES to mid to distal LCX. Repeat LHC on 06/01/2024 showed stable stable disease with patent stents and evidence of coronary vasospasm in the RCA. Continue Medical therapy was recommended and she was started on Amlodipine .  - She continues to have daily symptoms of what she refers to as her spasms. No real improvement with higher does of Amlodipine .   - Previously unable to tolerate Imdur  due to migraines.  - Continue DAPT with Aspirin  81mg  daily and Effient  10mg  daily.  - Intolerant to statins. She has been referred to Lipid Clinic for consideration of PCSK9 inhibitor.   - Do not think any additional ischemic work-up is necessary at this time. We will continue to try to optimize medications that help with coronary vasospasms. We discussed different options including (1) increasing Amlodipine  to 10mg  daily, (2) stopping Amlodipine  and switching to Diltiazem, (3) continuing current dose of Amlodipine  and adding Ranexa (even though this is not . After shared decision making, she preferred to stop Amlodipine  and try Diltiazem. Will start Diltiazem 120mg  daily. Also recommend following up with GI to see if there is a potential GI component. She does report nausea with the pain as well as some constipation. Question whether she could have esophageal spasms as well (Diltiazem could potentially help this some).    Palpitations Paroxysmal SVT Patient has a history of palpitation. Recent monitor in 03/2024 showed 9 short  runs of SVT (longest run 15 beats) and rare PACs/ PVCs but no significant arrhythmias.  - She still reports occasional palpitations.  - Continue Toprol -XL 12.5 mg daily.  - Will stop Amlodipine  and switch to Diltiazem 120mg  daily.    Syncope Patient had a syncopal episode in 03/2024 after removing a Zio monitor which showed no significant arrhythmias. Therefore, a second 2 week monitor was ordered. She states she completed wearing this but unable to see results.  Echo in 05/2024 showed normal LV function with  no significant valvular disease. She reported another syncopal episode after most recent cardiac catheterization that sounds vasovagal in nature. However, I don't see anything about this mentioned in hospital notes.  - No recurrent syncope since last visit.  - She has previously been counseled on driving restrictions.   Hyperlipidemia Lipid panel on 04/22/2024: Total Cholesterol 185, Triglycerides 122, HDL 52, LDL 109. LDL goal <70 given CAD. - Recently started on Crestor  in 04/2024 but unable to tolerate this due to myalgias.  - She was previously referred to the Lipid Clinic for consideration of PCSK9 inhibitor. She states she never heard anything about this yet. Will follow-up on this.    Fatigue Snoring She describes significant fatigue. She also reports snoring. STOP-BANG score = 4.  - Itmar sleep study was ordered at last visit to rule out obstructive sleep apnea.  She states she has not heard anything about this yet. Will follow-up on this.   Of note, in-person sign language interpreter was used throughout entirety of visit.   Disposition: Patient already has follow-up with Dr. Wonda scheduled for next month.    Signed, Aline FORBES Door, PA-C  06/30/2024 9:57 PM    [facility]

## 2024-06-24 NOTE — Patient Instructions (Signed)
 Visit Information  Thank you for taking time to visit with me today. Please don't hesitate to contact me if I can be of assistance to you before our next scheduled telephone appointment.  Our next appointment is by telephone on Wednesday October 15th at 2:00pm  Following is a copy of your care plan:   Goals Addressed             This Visit's Progress    VBCI Transitions of Care (TOC) Care Plan       Problems: (06/24/2024) Recent Hospitalization for treatment of Anxiety, CAD, and HTN Hospital or ED Admin Risk is 42%  Goal:  (06/24/2024) Over the next 30 days, the patient will not experience hospital readmission  Interventions:  (06/24/2024)  CAD Interventions: Assessed understanding of CAD diagnosis Medications reviewed including medications utilized in CAD treatment plan Provided education on Importance of limiting foods high in cholesterol Counseled on importance of regular laboratory monitoring as prescribed Reviewed Importance of taking all medications as prescribed Reviewed Importance of attending all scheduled provider appointments Advised to report any changes in symptoms or exercise tolerance Screening for signs and symptoms of depression related to chronic disease state Take Nitroglycerin  .4mg  tablets SL every 5 minutes as needed for chest pain Take Clonazepam  .5mg  twice daily PRN for anxiety Amlodipine  5mg  daily  Hypertension Interventions: (06/17/2024) Last practice recorded BP readings:  BP Readings from Last 3 Encounters:  06/02/24 117/64  05/13/24 120/80  05/02/24 133/81   Most recent eGFR/CrCl:  Lab Results  Component Value Date   EGFR 104 05/31/2022    No components found for: CRCL  Reviewed medications with patient and discussed importance of compliance Counseled on the importance of exercise goals with target of 150 minutes per week Discussed plans with patient for ongoing care management follow up and provided patient with direct contact information  for care management team Reviewed scheduled/upcoming provider appointments including:  Provided education on prescribed diet Heart Healthy Screening for signs and symptoms of depression related to chronic disease state  Screening for Anxiety related to symptoms related to Anxiety - The patient has some panic attacks and is now on Buspar  and Klonopin  06/10/24 - Referral to Cardiologist and Cardiac Rehab scheduled for October 3rd - 10/8 Cardiac Rehab is on hold for at least two more weeks due to chest pain 06/10/24 - Referral to [facility] for stress and anxiety  Patient Self Care Activities: (06/24/2024) Attend all scheduled provider appointments Call pharmacy for medication refills 3-7 days in advance of running out of medications Call provider office for new concerns or questions  Notify RN Care Manager of [facility] call rescheduling needs Participate in Transition of Care Program/Attend [facility] scheduled calls Perform all self care activities independently  Take medications as prescribed    Plan:  Telephone follow up appointment with care management team member scheduled for:  Wednesday October 15th at 2:00pm        Patient verbalizes understanding of instructions and care plan provided today and agrees to view in MyChart. Active MyChart status and patient understanding of how to access instructions and care plan via MyChart confirmed with patient.     The patient has been provided with contact information for the care management team and has been advised to call with any health related questions or concerns.   Please call the care guide team at [phone] if you need to cancel or reschedule your appointment.   Please call the Suicide and Crisis Lifeline: 988 call the [place]  [employer]  Suicide Prevention Lifeline: (940)520-9771 or TTY: 6784397393 TTY 8191435165) to talk to a trained counselor if you are experiencing a Mental Health or Behavioral Health Crisis or need someone to talk  to.  Medford Balboa, BSN, RN [place]  VBCI - [employer] [facility] RN Care Manager [phone]

## 2024-06-24 NOTE — Transitions of Care (Post Inpatient/ED Visit) (Signed)
 Transition of Care week 4  Visit Note  06/24/2024  Name: Donna Davis MRN: [id number]          DOB: 02-Mar-1971  Situation: Patient enrolled in [facility] 30-day program. Visit completed with Hilja Hold by telephone.   Background:   Past Medical History:  Diagnosis Date   Allergic rhinitis due to allergen 12/19/2016   Anxiety    Anxiety state 09/03/2006   Arthralgia of right temporomandibular joint 08/26/2018   Bilateral deafness 01/17/2016   Cardiac murmur 03/19/2024   Chest pain of uncertain etiology 03/19/2024   Clostridium difficile infection    x several times   Deaf    uses sign language   Elevated LDL cholesterol level 04/07/2024   Family history of breast cancer in mother 08/26/2015   Family history of malignant neoplasm of gastrointestinal tract    Frequent PVCs 03/19/2024   Gallstones    GERD 07/01/2006   Qualifier: Diagnosis of   By: Charmayne MD, Arlyss         Hearing difficulty    b/l, 2/2 congenital rubella s/p stapedectomy. Using hearing aid   Hepatic cyst    6 mm on CT done done in 05/2005   Hyperglycemia 04/07/2024   Hypothyroidism    IBS 07/01/2006   Qualifier: Diagnosis of   By: Charmayne MD, Arlyss         Low back pain 10/09/2022   Migraine 12/02/2012   Obesity    Obesity (BMI 35.0-39.9 without comorbidity) 02/18/2014   On hormone replacement therapy 04/07/2024   Pain in thoracic spine 10/09/2022   Palpitations 03/05/2024   PONV (postoperative nausea and vomiting)    sometimes; depends on how long I'm under   Routine adult health maintenance 09/01/2012   MMG - Normal 2014 - she is due and I reminded her to schedule.   Pelvic - hysterectomy, no cervix.  Sees OBGYN  Flu vaccine seasonally.      DEXA - at 53yo      Screening for colon cancer 10/22/2023   Screening mammogram for breast cancer 04/07/2024    Assessment: Patient Reported Symptoms: Cognitive Cognitive Status: Alert and oriented to person, place, and time, Normal speech and language skills       Neurological Neurological Review of Symptoms: No symptoms reported    HEENT HEENT Symptoms Reported: Not assessed      Cardiovascular Cardiovascular Symptoms Reported: Chest pain or discomfort, Palpitations Other Cardiovascular Symptoms: Nausea. The patient saw the Cardiolgist and had blood work drawn. unknown reason for continued chest dicomfort and vasospasms. The patient's Amlodipine  was increased. She is to return in two weeks. Cardiac Rehab is on hold. Does patient have uncontrolled Hypertension?: No Cardiovascular Management Strategies: Adequate rest, Medication therapy, Routine screening, Diet modification  Respiratory Respiratory Symptoms Reported: Shortness of breath    Endocrine Endocrine Symptoms Reported: No symptoms reported Is patient diabetic?: No    Gastrointestinal Gastrointestinal Symptoms Reported: Nausea Additional Gastrointestinal Details: Occasional Gastrointestinal Management Strategies: Medication therapy    Genitourinary Genitourinary Symptoms Reported: No symptoms reported    Integumentary      Musculoskeletal Musculoskelatal Symptoms Reviewed: Not assessed        Psychosocial Psychosocial Symptoms Reported: Anxiety - if selected complete GAD         There were no vitals filed for this visit.  Medications Reviewed Today     Reviewed by Moises Reusing, RN (Case Manager) on 06/24/24 at 1409  Med List Status: <None>   Medication Order Taking? Sig Documenting  Provider Last Dose Status Informant  acetaminophen  (TYLENOL ) 500 MG tablet [id number]  Take 1,000 mg by mouth every 6 (six) hours as needed for mild pain (pain score 1-3) or moderate pain (pain score 4-6). [provider]  Active Self, Pharmacy Records, Multiple Informants  amLODipine  (NORVASC ) 5 MG tablet [id number]  Take 1 tablet (5 mg total) by mouth daily. Goodrich, Callie E, PA-C  Active   Ascorbic Acid (VITAMIN C PO) [id number]  Take 1 tablet by mouth in the morning. [provider]  Active Self, Pharmacy Records, Multiple Informants           Med Note (GARNER, TIFFANY L   Fri Apr 10, 2024  3:08 PM)    aspirin  EC 81 MG tablet [id number]  Take 81 mg by mouth daily. Swallow whole. [provider]  Active Self, Pharmacy Records, Multiple Informants  busPIRone  (BUSPAR ) 10 MG tablet [id number]  TAKE 2 TABLETS BY MOUTH 3 TIMES A DAY Lau, Grace, MD  Active Self, Pharmacy Records, Multiple Informants           Med Note STEFFI, ADELITA   Fri May 29, 2024  4:54 PM)    busPIRone  (BUSPAR ) 10 MG tablet [id number]  Take 2 tablets (20 mg total) by mouth 3 (three) times daily.   Active   clonazePAM  (KLONOPIN ) 0.5 MG tablet [id number]  Take 1 tablet (0.5 mg total) by mouth 2 (two) times daily as needed for anxiety. Corey, Evan S, MD  Active Self, Pharmacy Records, Multiple Informants           Med Note STEFFI, ADELITA   Fri May 29, 2024  4:54 PM)    clonazePAM  (KLONOPIN ) 0.5 MG tablet [id number]  Take 1 tablet (0.5 mg total) by mouth 2 (two) times daily as needed FOR ANXIETY. Joane Artist RAMAN, MD  Active   fluticasone  (FLONASE ) 50 MCG/ACT nasal spray [id number]  SPRAY 1 SPRAY INTO EACH NOSTRIL EVERY DAY  Patient taking differently: Place 1 spray into both nostrils in the morning.   [name], MD  Active Self, Pharmacy Records, Multiple Informants           Med Note STEFFI, ADELITA   Fri May 29, 2024  4:54 PM)    fluticasone  (FLONASE ) 50 MCG/ACT nasal spray [id number]  Place 1 spray into both nostrils daily.   Active   levothyroxine  (SYNTHROID ) 25 MCG tablet [id number]  TAKE 1 TABLET BY MOUTH EVERY DAY  Patient taking differently: Take 25 mcg by mouth in the morning.   [name], MD  Active Self, Pharmacy Records, Multiple Informants           Med Note STEFFI, ADELITA   Fri May 29, 2024  4:54 PM)    metoprolol  succinate (TOPROL -XL) 25 MG 24 hr tablet [id number]  Take 0.5 tablets (12.5 mg total) by mouth daily. Waymond Cart, MD  Active   nitroGLYCERIN   (NITROSTAT ) 0.4 MG SL tablet [id number]  Place 1 tablet (0.4 mg total) under the tongue every 5 (five) minutes as needed for chest pain. Zhao, Xika, NP  Active Self, Pharmacy Records, Multiple Informants  pantoprazole  (PROTONIX ) 40 MG tablet [id number]  TAKE 1 TABLET BY MOUTH EVERY DAY [name], MD  Active Self, Pharmacy Records, Multiple Informants           Med Note STEFFI, ADELITA   Fri May 29, 2024  4:54 PM)    pantoprazole  (PROTONIX ) 40 MG tablet [id number]  Take 1 tablet (40 mg total) by mouth daily.  Active   prasugrel  (EFFIENT ) 10 MG TABS tablet [id number]  Take 1 tablet (10 mg total) by mouth daily.   Active   promethazine  (PHENERGAN ) 12.5 MG tablet [id number]  Take 1 tablet (12.5 mg total) by mouth every 6 (six) hours as needed for nausea or vomiting. [name], MD  Active Self, Pharmacy Records, Multiple Informants  traMADol  (ULTRAM ) 50 MG tablet [id number]  Take 1 tablet (50 mg total) by mouth every 6 (six) hours as needed for severe pain (pain score 7-10). [name], MD  Active             Recommendation:   Specialty provider follow-up Cardiologist 06/30/24 Continue Current Plan of Care  Follow Up Plan:   Telephone follow-up in 1 week  Medford Balboa, BSN, RN [place]  VBCI - [facility] RN Care Manager [phone]

## 2024-06-25 ENCOUNTER — Encounter: Payer: Self-pay | Admitting: Physician Assistant

## 2024-06-25 ENCOUNTER — Ambulatory Visit: Admitting: Physician Assistant

## 2024-06-25 VITALS — BP 148/73 | HR 77 | Ht 60.0 in | Wt 176.6 lb

## 2024-06-25 DIAGNOSIS — Z8041 Family history of malignant neoplasm of ovary: Secondary | ICD-10-CM

## 2024-06-25 DIAGNOSIS — Z124 Encounter for screening for malignant neoplasm of cervix: Secondary | ICD-10-CM

## 2024-06-25 DIAGNOSIS — Z1231 Encounter for screening mammogram for malignant neoplasm of breast: Secondary | ICD-10-CM

## 2024-06-25 DIAGNOSIS — Z803 Family history of malignant neoplasm of breast: Secondary | ICD-10-CM

## 2024-06-25 DIAGNOSIS — Z01419 Encounter for gynecological examination (general) (routine) without abnormal findings: Secondary | ICD-10-CM

## 2024-06-25 NOTE — Progress Notes (Signed)
 Pt presents for AEX Total Hysterectomy  Mammogram and colonoscopy ordered

## 2024-06-25 NOTE — Progress Notes (Signed)
 ANNUAL EXAM Patient name: Donna Davis MRN [id number]  Date of birth: 1971-02-09 Chief Complaint:   No chief complaint on file.  History of Present Illness:   Donna Davis is a 53 y.o. [phone] female being seen today for a routine annual exam.   Current complaints: None  Patient's last menstrual period was 12/26/2004.  The pregnancy intention screening data noted above was reviewed. Potential methods of contraception were discussed. The patient elected to proceed with No data recorded.   Last pap: Pre-TAH in 2005, no history of abnormal paps.  Last mammogram: 2024. Results were: normal. Family h/o breast cancer: yes mother with breast and ovarian cancer.  Last colonoscopy: 2025 with tubular adenoma removed. Negative for high grade dysplasia or malignancy. Family h/o colorectal cancer: yes maternal grandmother. STI screening:  Contraception: Total abdominal hysterectomy 2005     06/25/2024    4:50 PM 04/30/2024    2:38 PM 01/09/2024    1:31 PM 08/09/2023    9:44 AM 07/15/2023    4:43 PM  Depression screen PHQ 2/9  Decreased Interest 0 0 0 0 0  Down, Depressed, Hopeless 0 0 0 0 0  PHQ - 2 Score 0 0 0 0 0  Altered sleeping 1  0  0  Tired, decreased energy 2  0  3  Change in appetite 2  0  3  Feeling bad or failure about yourself  0  0  0  Trouble concentrating 0  0  0  Moving slowly or fidgety/restless 0  0  0  Suicidal thoughts 0  0  0  PHQ-9 Score 5  0  6  Difficult doing work/chores   Not difficult at all  Somewhat difficult        06/25/2024    4:51 PM 06/17/2024    2:38 PM 06/03/2024    1:35 PM 07/15/2023    4:44 PM  GAD 7 : Generalized Anxiety Score  Nervous, Anxious, on Edge 1 2 1  0  Control/stop worrying 0 2 1 0  Worry too much - different things 0 0 1 0  Trouble relaxing 1 2 2 1   Restless 0 0 0 0  Easily annoyed or irritable 0 1 0 1  Afraid - awful might happen 0 1 0 0  Total GAD 7 Score 2 8 5 2   Anxiety Difficulty  Somewhat difficult Somewhat difficult  Somewhat difficult     Review of Systems:   Pertinent items are noted in HPI Denies any headaches, blurred vision, fatigue, shortness of breath, chest pain, abdominal pain, abnormal vaginal discharge/itching/odor/irritation, problems with periods, bowel movements, urination, or intercourse unless otherwise stated above. Pertinent History Reviewed:  Reviewed past medical,surgical, social and family history.  Reviewed problem list, medications and allergies. Physical Assessment:   Vitals:   06/25/24 1316  BP: (!) 148/73  Pulse: 77  Weight: 176 lb 9.6 oz (80.1 kg)  Height: 5' (1.524 m)  Body mass index is 34.49 kg/m.        Physical Examination:   General appearance - well appearing, and in no distress  Mental status - alert, oriented to person, place, and time  Psych:  She has a normal mood and affect  Skin - warm and dry, normal color, no suspicious lesions noted  Chest - effort normal, all lung fields clear to auscultation bilaterally  Heart - normal rate and regular rhythm  Neck:  midline trachea, no thyromegaly or nodules  Breasts - breasts appear normal, no  suspicious masses, no skin or nipple changes or  axillary nodes  Abdomen - soft, nontender, nondistended, no masses or organomegaly  Pelvic - VULVA: normal appearing vulva with no masses, tenderness or lesions  VAGINA: normal appearing vagina with normal color and discharge, no lesions  CERVIX: normal appearing cervix without discharge or lesions, no CMT   UTERUS: uterus is felt to be normal size, shape, consistency and nontender   ADNEXA: No adnexal masses or tenderness noted.  Extremities:  No swelling or varicosities noted  Chaperone present for exam  No results found. However, due to the size of the patient record, not all encounters were searched. Please check Results Review for a complete set of results.  Assessment & Plan:  1. Encounter for annual routine gynecological examination (Primary) 2. Cervical cancer  screening 3. Breast cancer screening by mammogram - Cervical cancer screening: TAH in 2005 with no history abnormal pap smears.  - Breast Health: Encouraged self breast awareness/SBE. Discussed limits of clinical breast exam for detecting breast cancer. Discussed importance of annual MXR. Ordered by PCP - Climacteric/Sexual health: Reviewed typical and atypical symptoms of menopause/peri-menopause. Discussed PMB and to call if any amount of spotting.  - Colonoscopy: up to date, f/u per GI - F/U 12 months and prn   4. Family history of breast cancer in mother 5. Family history of ovarian cancer Recommended genetic testing and counseling; patient would like time to consider.  No orders of the defined types were placed in this encounter.  Meds: No orders of the defined types were placed in this encounter.  Follow-up: Return in about 1 year (around 06/25/2025) for ANN.  Donna Davis, [place] 06/25/2024 6:15 PM

## 2024-06-26 NOTE — Telephone Encounter (Signed)
 Reminder call of upcoming appt  06/29/2024 3:30 PM Bennett Renda [facility] [phone] [address]

## 2024-06-29 ENCOUNTER — Ambulatory Visit (INDEPENDENT_AMBULATORY_CARE_PROVIDER_SITE_OTHER): Admitting: Licensed Clinical Social Worker

## 2024-06-29 ENCOUNTER — Ambulatory Visit: Admitting: Physician Assistant

## 2024-06-29 DIAGNOSIS — Z566 Other physical and mental strain related to work: Secondary | ICD-10-CM

## 2024-06-30 ENCOUNTER — Encounter: Payer: Self-pay | Admitting: Student

## 2024-06-30 ENCOUNTER — Other Ambulatory Visit: Payer: Self-pay

## 2024-06-30 ENCOUNTER — Ambulatory Visit: Attending: Student | Admitting: Student

## 2024-06-30 VITALS — BP 125/79 | HR 72 | Ht 60.0 in | Wt 174.0 lb

## 2024-06-30 DIAGNOSIS — I201 Angina pectoris with documented spasm: Secondary | ICD-10-CM

## 2024-06-30 DIAGNOSIS — R5383 Other fatigue: Secondary | ICD-10-CM

## 2024-06-30 DIAGNOSIS — R079 Chest pain, unspecified: Secondary | ICD-10-CM | POA: Diagnosis not present

## 2024-06-30 DIAGNOSIS — R0683 Snoring: Secondary | ICD-10-CM

## 2024-06-30 DIAGNOSIS — E785 Hyperlipidemia, unspecified: Secondary | ICD-10-CM

## 2024-06-30 DIAGNOSIS — I2510 Atherosclerotic heart disease of native coronary artery without angina pectoris: Secondary | ICD-10-CM

## 2024-06-30 DIAGNOSIS — R55 Syncope and collapse: Secondary | ICD-10-CM

## 2024-06-30 DIAGNOSIS — I471 Supraventricular tachycardia, unspecified: Secondary | ICD-10-CM | POA: Diagnosis not present

## 2024-06-30 DIAGNOSIS — R002 Palpitations: Secondary | ICD-10-CM

## 2024-06-30 DIAGNOSIS — I25111 Atherosclerotic heart disease of native coronary artery with angina pectoris with documented spasm: Secondary | ICD-10-CM | POA: Diagnosis not present

## 2024-06-30 MED ORDER — DILTIAZEM HCL ER COATED BEADS 120 MG PO CP24
120.0000 mg | ORAL_CAPSULE | Freq: Every day | ORAL | 3 refills | Status: DC
  Filled 2024-06-30 (×2): qty 30, 30d supply, fill #0
  Filled 2024-07-28: qty 30, 30d supply, fill #1
  Filled 2024-08-24: qty 30, 30d supply, fill #2

## 2024-06-30 NOTE — Patient Instructions (Addendum)
 Thank you for choosing [facility]!     Medication Instructions:  Discontinue Amlodipine . This medication has been taken off your medication list.  Begin Diltiazem 120mg . Take this tablet once daily.  Follow up with your [facility] provider  *If you need a refill on your cardiac medications before your next appointment, please call your pharmacy*   Lab Work: No labs were ordered during today's visit.   If you have labs (blood work) drawn today and your tests are completely normal, you will receive your results only by: MyChart Message (if you have MyChart) OR A paper copy in the mail If you have any lab test that is abnormal or we need to change your treatment, we will call you to review the results.   Testing/Procedures: No procedures were ordered during today's visit.     Follow-Up: At [facility], you and your health needs are our priority.  As part of our continuing mission to provide you with exceptional heart care, we have created designated Provider Care Teams.  These Care Teams include your primary Cardiologist (physician) and Advanced Practice Providers (APPs -  Physician Assistants and Nurse Practitioners) who all work together to provide you with the care you need, when you need it. We recommend signing up for the patient portal called MyChart.  Sign up information is provided on this After Visit Summary.  MyChart is used to connect with patients for Virtual Visits (Telemedicine).  Patients are able to view lab/test results, encounter notes, upcoming appointments, etc.  Non-urgent messages can be sent to your provider as well.   To learn more about what you can do with MyChart, go to [email or web address].

## 2024-07-01 ENCOUNTER — Other Ambulatory Visit: Payer: Self-pay

## 2024-07-01 NOTE — Patient Instructions (Signed)
 Visit Information  Thank you for taking time to visit with me today. Please don't hesitate to contact me if I can be of assistance to you before our next scheduled telephone appointment.  Our next appointment is by telephone on Wednesday October 22nd at 2:00pm  Following is a copy of your care plan:   Goals Addressed             This Visit's Progress    VBCI Transitions of Care (TOC) Care Plan       Problems: (07/01/2024) Recent Hospitalization for treatment of Anxiety, CAD, and HTN Hospital or ED Admin Risk is 42%  Goal:  (07/01/2024) Over the next 30 days, the patient will not experience hospital readmission  Interventions:  (07/01/2024)  CAD Interventions: Assessed understanding of CAD diagnosis Medications reviewed including medications utilized in CAD treatment plan Provided education on Importance of limiting foods high in cholesterol Counseled on importance of regular laboratory monitoring as prescribed Reviewed Importance of taking all medications as prescribed Reviewed Importance of attending all scheduled provider appointments Advised to report any changes in symptoms or exercise tolerance Screening for signs and symptoms of depression related to chronic disease state Take Nitroglycerin  .4mg  tablets SL every 5 minutes as needed for chest pain Take Clonazepam  .5mg  twice daily PRN for anxiety Amlodipine  5mg  daily - Discontinued 06/30/24 Diltiazepam 120mg  daily - Started 07/01/24 Referral to GI - 06/30/24 Referral to Lipid Clinic - 06/30/24 Referral for sleep study to rule out OSA - 06/30/24  Hypertension Interventions: (06/30/2024) Last practice recorded BP readings:  BP Readings from Last 3 Encounters:  06/02/24 117/64  05/13/24 120/80  05/02/24 133/81   Most recent eGFR/CrCl:  Lab Results  Component Value Date   EGFR 104 05/31/2022    No components found for: CRCL  Reviewed medications with patient and discussed importance of compliance Counseled on  the importance of exercise goals with target of 150 minutes per week Discussed plans with patient for ongoing care management follow up and provided patient with direct contact information for care management team Reviewed scheduled/upcoming provider appointments including:  Provided education on prescribed diet Heart Healthy Screening for signs and symptoms of depression related to chronic disease state  Screening for Anxiety related to symptoms related to Anxiety - The patient has some panic attacks and is now on Buspar  and Klonopin  06/10/24 - Referral to Cardiologist and Cardiac Rehab scheduled for October 3rd - 10/8 Cardiac Rehab is on hold for at least two more weeks due to chest pain 06/10/24 - Referral to [facility] for stress and anxiety - Appointment scheduled for 07/13/24  Patient Self Care Activities: (07/01/2024) Attend all scheduled provider appointments Call pharmacy for medication refills 3-7 days in advance of running out of medications Call provider office for new concerns or questions  Notify RN Care Manager of [facility] call rescheduling needs Participate in Transition of Care Program/Attend [facility] scheduled calls Perform all self care activities independently  Take medications as prescribed    Plan:  Telephone follow up appointment with care management team member scheduled for:  Wednesday October 22nd at 2:00pm        Patient verbalizes understanding of instructions and care plan provided today and agrees to view in MyChart. Active MyChart status and patient understanding of how to access instructions and care plan via MyChart confirmed with patient.     The patient has been provided with contact information for the care management team and has been advised to call with any health related questions  or concerns.   Please call the care guide team at [phone] if you need to cancel or reschedule your appointment.   Please call the Suicide and Crisis Lifeline: 988 call  the [place]  National Suicide Prevention Lifeline: 8570991648 or TTY: (510)466-8049 TTY [phone]) to talk to a trained counselor if you are experiencing a Mental Health or Behavioral Health Crisis or need someone to talk to.  Medford Balboa, BSN, RN [place]  VBCI - [employer] [facility] RN Care Manager [phone]

## 2024-07-01 NOTE — Transitions of Care (Post Inpatient/ED Visit) (Signed)
 Transition of Care Week 5  Visit Note  07/01/2024  Name: Donna Davis MRN: [id number]          DOB: 1971/04/27  Situation: Patient enrolled in [facility] 30-day program. Visit completed with Alyx Umbaugh by telephone.   Background:   Initial Transition Care Management Follow-up Telephone Call Discharge Date and Diagnosis: 06/02/24, Angina   Past Medical History:  Diagnosis Date   Allergic rhinitis due to allergen 12/19/2016   Anxiety    Anxiety state 09/03/2006   Arthralgia of right temporomandibular joint 08/26/2018   Bilateral deafness 01/17/2016   Cardiac murmur 03/19/2024   Chest pain of uncertain etiology 03/19/2024   Clostridium difficile infection    x several times   Deaf    uses sign language   Elevated LDL cholesterol level 04/07/2024   Family history of breast cancer in mother 08/26/2015   Family history of malignant neoplasm of gastrointestinal tract    Frequent PVCs 03/19/2024   Gallstones    GERD 07/01/2006   Qualifier: Diagnosis of   By: Charmayne MD, Arlyss         Hearing difficulty    b/l, 2/2 congenital rubella s/p stapedectomy. Using hearing aid   Hepatic cyst    6 mm on CT done done in 05/2005   Hyperglycemia 04/07/2024   Hypothyroidism    IBS 07/01/2006   Qualifier: Diagnosis of   By: Charmayne MD, Arlyss         Low back pain 10/09/2022   Migraine 12/02/2012   Obesity    Obesity (BMI 35.0-39.9 without comorbidity) 02/18/2014   On hormone replacement therapy 04/07/2024   Pain in thoracic spine 10/09/2022   Palpitations 03/05/2024   PONV (postoperative nausea and vomiting)    sometimes; depends on how long I'm under   Routine adult health maintenance 09/01/2012   MMG - Normal 2014 - she is due and I reminded her to schedule.   Pelvic - hysterectomy, no cervix.  Sees OBGYN  Flu vaccine seasonally.      DEXA - at 53yo      Screening for colon cancer 10/22/2023   Screening mammogram for breast cancer 04/07/2024    Assessment: Patient Reported  Symptoms: Cognitive Cognitive Status: Alert and oriented to person, place, and time, Normal speech and language skills      Neurological Neurological Review of Symptoms: No symptoms reported    HEENT HEENT Symptoms Reported: No symptoms reported      Cardiovascular Cardiovascular Symptoms Reported: Chest pain or discomfort, Palpitations, Fatigue, Other: Other Cardiovascular Symptoms: Nausea. The patient went to the Cardiologist and her medication was changed becaue the patient is still having the chest pain and some nausea. There hasn't been much improvement in a week. A referral was placed to GI to rule out Esophageal spasms. Does patient have uncontrolled Hypertension?: No Cardiovascular Management Strategies: Medication therapy, Adequate rest, Coping strategies, Routine screening Cardiovascular Comment: Continues with anxiety related to her chest pain  Respiratory Respiratory Symptoms Reported: No symptoms reported    Endocrine Endocrine Symptoms Reported: No symptoms reported Is patient diabetic?: No    Gastrointestinal Gastrointestinal Symptoms Reported: Constipation, Nausea Additional Gastrointestinal Details: Occasional. Nausea associated with chest pain Gastrointestinal Management Strategies: Medication therapy Gastrointestinal Comment: She has been referred to GI for constipation and to rule out esophageal spasms    Genitourinary Genitourinary Symptoms Reported: No symptoms reported    Integumentary Integumentary Symptoms Reported: No symptoms reported    Musculoskeletal Musculoskelatal Symptoms Reviewed: No symptoms reported  Psychosocial Psychosocial Symptoms Reported: Anxiety - if selected complete GAD, Depression - if selected complete PHQ 2-9         There were no vitals filed for this visit.  Medications Reviewed Today     Reviewed by Moises Reusing, RN (Case Manager) on 07/01/24 at 1430  Med List Status: <None>   Medication Order Taking? Sig  Documenting Provider Last Dose Status Informant  acetaminophen  (TYLENOL ) 500 MG tablet [id number]  Take 1,000 mg by mouth every 6 (six) hours as needed for mild pain (pain score 1-3) or moderate pain (pain score 4-6). [provider]  Active Self, Pharmacy Records, Multiple Informants  Ascorbic Acid (VITAMIN C PO) [id number]  Take 1 tablet by mouth in the morning. [provider]  Active Self, Pharmacy Records, Multiple Informants           Med Note (GARNER, TIFFANY L   Fri Apr 10, 2024  3:08 PM)    aspirin  EC 81 MG tablet [id number]  Take 81 mg by mouth daily. Swallow whole. [provider]  Active Self, Pharmacy Records, Multiple Informants  busPIRone  (BUSPAR ) 10 MG tablet [id number]  TAKE 2 TABLETS BY MOUTH 3 TIMES A DAY Lau, Grace, MD  Active Self, Pharmacy Records, Multiple Informants           Med Note STEFFI, ADELITA   Fri May 29, 2024  4:54 PM)    busPIRone  (BUSPAR ) 10 MG tablet [id number]  Take 2 tablets (20 mg total) by mouth 3 (three) times daily.   Active   clonazePAM  (KLONOPIN ) 0.5 MG tablet [id number]  Take 1 tablet (0.5 mg total) by mouth 2 (two) times daily as needed for anxiety. Corey, Evan S, MD  Active Self, Pharmacy Records, Multiple Informants           Med Note STEFFI, ADELITA   Fri May 29, 2024  4:54 PM)    clonazePAM  (KLONOPIN ) 0.5 MG tablet [id number]  Take 1 tablet (0.5 mg total) by mouth 2 (two) times daily as needed FOR ANXIETY. Corey, Evan S, MD  Active   diltiazem (CARDIZEM CD) 120 MG 24 hr capsule [id number]  Take 1 capsule (120 mg total) by mouth daily. Goodrich, Callie E, PA-C  Active   fluticasone  (FLONASE ) 50 MCG/ACT nasal spray [id number]  SPRAY 1 SPRAY INTO EACH NOSTRIL EVERY DAY [name], MD  Active Self, Pharmacy Records, Multiple Informants           Med Note STEFFI, ADELITA   Fri May 29, 2024  4:54 PM)    fluticasone  (FLONASE ) 50 MCG/ACT nasal spray [id number]  Place 1 spray into both nostrils daily.   Active    levothyroxine  (SYNTHROID ) 25 MCG tablet [id number]  TAKE 1 TABLET BY MOUTH EVERY DAY [name], MD  Active Self, Pharmacy Records, Multiple Informants           Med Note STEFFI, ADELITA   Fri May 29, 2024  4:54 PM)    metoprolol  succinate (TOPROL -XL) 25 MG 24 hr tablet [id number]  Take 0.5 tablets (12.5 mg total) by mouth daily. Waymond Cart, MD  Active   nitroGLYCERIN  (NITROSTAT ) 0.4 MG SL tablet [id number]  Place 1 tablet (0.4 mg total) under the tongue every 5 (five) minutes as needed for chest pain. Zhao, Xika, NP  Active Self, Pharmacy Records, Multiple Informants  pantoprazole  (PROTONIX ) 40 MG tablet [id number]  TAKE 1 TABLET BY MOUTH EVERY DAY [name], MD  Active Self, Pharmacy Records, Multiple Informants  Med Note STEFFI, [place]   Fri May 29, 2024  4:54 PM)    pantoprazole  (PROTONIX ) 40 MG tablet [id number]  Take 1 tablet (40 mg total) by mouth daily.   Active   prasugrel  (EFFIENT ) 10 MG TABS tablet [id number]  Take 1 tablet (10 mg total) by mouth daily.   Active   promethazine  (PHENERGAN ) 12.5 MG tablet [id number]  Take 1 tablet (12.5 mg total) by mouth every 6 (six) hours as needed for nausea or vomiting. [name], MD  Active Self, Pharmacy Records, Multiple Informants  traMADol  (ULTRAM ) 50 MG tablet [id number]  Take 1 tablet (50 mg total) by mouth every 6 (six) hours as needed for severe pain (pain score 7-10). [name], MD  Active             Recommendation:   Continue Current Plan of Care  Follow Up Plan:   Telephone follow-up in 1 week. Transfer to CCM if agreeable  Medford Balboa, BSN, RN [place]  VBCI - [facility] RN Care Manager [phone]

## 2024-07-01 NOTE — Addendum Note (Signed)
 Addended by: MALVINA CHARLENA CROME on: 07/01/2024 05:16 PM   Modules accepted: Orders

## 2024-07-02 ENCOUNTER — Other Ambulatory Visit: Payer: Self-pay | Admitting: Internal Medicine

## 2024-07-02 ENCOUNTER — Other Ambulatory Visit: Payer: Self-pay

## 2024-07-02 DIAGNOSIS — R112 Nausea with vomiting, unspecified: Secondary | ICD-10-CM

## 2024-07-02 DIAGNOSIS — G43719 Chronic migraine without aura, intractable, without status migrainosus: Secondary | ICD-10-CM

## 2024-07-02 MED ORDER — TRAMADOL HCL 50 MG PO TABS
50.0000 mg | ORAL_TABLET | Freq: Four times a day (QID) | ORAL | 0 refills | Status: DC | PRN
  Filled 2024-07-02: qty 40, 10d supply, fill #0

## 2024-07-02 MED ORDER — PROMETHAZINE HCL 12.5 MG PO TABS
12.5000 mg | ORAL_TABLET | Freq: Four times a day (QID) | ORAL | 0 refills | Status: AC | PRN
  Filled 2024-07-02: qty 30, 8d supply, fill #0

## 2024-07-03 ENCOUNTER — Other Ambulatory Visit: Payer: Self-pay

## 2024-07-03 ENCOUNTER — Telehealth: Payer: Self-pay

## 2024-07-03 NOTE — Telephone Encounter (Signed)
**Note De-identified Gian Ybarra Obfuscation** -----  **Note De-Identified Allex Madia Obfuscation** Message from [facility] Anitra W sent at 07/01/2024  5:09 PM EDT ----- An Itmar sleep study was ordered for this patient at her last visit but she states she has not heard anything about this yet. Is this due to the backlog? Do you mind just following up on this? ----- Message ----- From: Goodrich, Callie E, PA-C Sent: 06/30/2024  10:17 PM EDT To: Charlena LITTIE Dixons, CMA  Hey Anitra!  Sorry, I forgot to mention this to you today for this patient. I ordered an Itmar sleep study for this patient at her last visit but she states she has not heard anything about this yet. Is this due to the backlog? Do you mind just following up on this?  She was also previously referred to the [facility] (at least I think she was based on one of the hospital notes from recent admission). However, she states she has not heard anything about this either. Can you follow up on this as well and help place another referral if needed?  Thanks so much! Callie

## 2024-07-03 NOTE — Telephone Encounter (Signed)
**Note De-Identified Ellen Mayol Obfuscation** I called the pt back and an interpreter answered the call and assisted us  with our conversation.  I advised the pt that the [employer] will start doing the PAs and will provide the device to her Tiauna Whisnant the mail. I did advise her that the company plans to start this process on Monday 10/21 and that they will be reaching out to her throughout her testing once they start the PA for her [employer]-HST.  The pt verbalized understanding and thanked me for calling her to explain what is happening.

## 2024-07-08 ENCOUNTER — Other Ambulatory Visit: Payer: Self-pay

## 2024-07-08 NOTE — Patient Instructions (Signed)
 Visit Information  Thank you for taking time to visit with me today. Please don't hesitate to contact me if I can be of assistance to you before our next scheduled telephone appointment.    Following is a copy of your care plan:   Goals Addressed             This Visit's Progress    COMPLETED: VBCI Transitions of Care (TOC) Care Plan       Problems: (reviewed 07/08/24) Recent Hospitalization for treatment of Anxiety, CAD, and HTN Hospital or ED Admin Risk is 42%  Goal:  (reviewed 07/08/24) Over the next 30 days, the patient will not experience hospital readmission  Interventions:  (reviewed 07/08/24)  CAD Interventions: Assessed understanding of CAD diagnosis Medications reviewed including medications utilized in CAD treatment plan Provided education on Importance of limiting foods high in cholesterol Counseled on importance of regular laboratory monitoring as prescribed Reviewed Importance of taking all medications as prescribed Reviewed Importance of attending all scheduled provider appointments Advised to report any changes in symptoms or exercise tolerance Screening for signs and symptoms of depression related to chronic disease state Take Nitroglycerin  .4mg  tablets SL every 5 minutes as needed for chest pain Take Clonazepam  .5mg  twice daily PRN for anxiety Amlodipine  5mg  daily - Discontinued 06/30/24 Diltiazepam 120mg  daily - Started 07/01/24 Referral to GI - 06/30/24 Referral to Lipid Clinic - 06/30/24 Referral for sleep study to rule out OSA - 06/30/24  Hypertension Interventions: (reviewed 07/08/24) Last practice recorded BP readings:  BP Readings from Last 3 Encounters:  06/02/24 117/64  05/13/24 120/80  05/02/24 133/81   Most recent eGFR/CrCl:  Lab Results  Component Value Date   EGFR 104 05/31/2022    No components found for: CRCL  Reviewed medications with patient and discussed importance of compliance Counseled on the importance of exercise goals  with target of 150 minutes per week Discussed plans with patient for ongoing care management follow up and provided patient with direct contact information for care management team Reviewed scheduled/upcoming provider appointments including:  Provided education on prescribed diet Heart Healthy Screening for signs and symptoms of depression related to chronic disease state  Screening for Anxiety related to symptoms related to Anxiety - The patient has some panic attacks and is now on Buspar  and Klonopin  06/10/24 - Referral to Cardiologist and Cardiac Rehab scheduled for October 3rd - 10/8 Cardiac Rehab is on hold for at least two more weeks due to chest pain 06/10/24 - Referral to [facility] for stress and anxiety - Appointment scheduled for 07/13/24  Patient Self Care Activities: (reviewed 07/08/24) Attend all scheduled provider appointments Call pharmacy for medication refills 3-7 days in advance of running out of medications Call provider office for new concerns or questions  Notify RN Care Manager of TOC call rescheduling needs Participate in Transition of Care Program/Attend TOC scheduled calls Perform all self care activities independently  Take medications as prescribed    Plan:  Telephone follow up appointment with care management team member scheduled for:  Goals have been met. Patient discharged from [facility]        Patient verbalizes understanding of instructions and care plan provided today and agrees to view in MyChart. Active MyChart status and patient understanding of how to access instructions and care plan via MyChart confirmed with patient.     The patient has been provided with contact information for the care management team and has been advised to call with any health related questions or concerns.  Please call the care guide team at [phone] if you need to cancel or reschedule your appointment.   Please call the Suicide and Crisis Lifeline: 988 call the [place]   National Suicide Prevention Lifeline: 775 423 1595 or TTY: 9076256964 TTY [phone]) to talk to a trained counselor if you are experiencing a Mental Health or Behavioral Health Crisis or need someone to talk to.  Medford Balboa, BSN, RN [place]  VBCI - [employer] [facility] RN Care Manager [phone]

## 2024-07-08 NOTE — Transitions of Care (Post Inpatient/ED Visit) (Signed)
 Transition of Care Week 6  Visit Note  07/08/2024  Name: Donna Davis MRN: [id number]          DOB: Jul 24, 1971  Situation: Patient enrolled in [facility] 30-day program. Visit completed with Dierra Garretson by telephone.   Background:   Initial Transition Care Management Follow-up Telephone Call Discharge Date and Diagnosis: No data recorded   Past Medical History:  Diagnosis Date   Allergic rhinitis due to allergen 12/19/2016   Anxiety    Anxiety state 09/03/2006   Arthralgia of right temporomandibular joint 08/26/2018   Bilateral deafness 01/17/2016   Cardiac murmur 03/19/2024   Chest pain of uncertain etiology 03/19/2024   Clostridium difficile infection    x several times   Deaf    uses sign language   Elevated LDL cholesterol level 04/07/2024   Family history of breast cancer in mother 08/26/2015   Family history of malignant neoplasm of gastrointestinal tract    Frequent PVCs 03/19/2024   Gallstones    GERD 07/01/2006   Qualifier: Diagnosis of   By: Charmayne MD, Arlyss         Hearing difficulty    b/l, 2/2 congenital rubella s/p stapedectomy. Using hearing aid   Hepatic cyst    6 mm on CT done done in 05/2005   Hyperglycemia 04/07/2024   Hypothyroidism    IBS 07/01/2006   Qualifier: Diagnosis of   By: Charmayne MD, Arlyss         Low back pain 10/09/2022   Migraine 12/02/2012   Obesity    Obesity (BMI 35.0-39.9 without comorbidity) 02/18/2014   On hormone replacement therapy 04/07/2024   Pain in thoracic spine 10/09/2022   Palpitations 03/05/2024   PONV (postoperative nausea and vomiting)    sometimes; depends on how long I'm under   Routine adult health maintenance 09/01/2012   MMG - Normal 2014 - she is due and I reminded her to schedule.   Pelvic - hysterectomy, no cervix.  Sees OBGYN  Flu vaccine seasonally.      DEXA - at 53yo      Screening for colon cancer 10/22/2023   Screening mammogram for breast cancer 04/07/2024    Assessment: Patient Reported  Symptoms: Cognitive Cognitive Status: Alert and oriented to person, place, and time, Normal speech and language skills      Neurological Neurological Review of Symptoms: No symptoms reported    HEENT HEENT Symptoms Reported: No symptoms reported      Cardiovascular Cardiovascular Symptoms Reported: Chest pain or discomfort, Palpitations Other Cardiovascular Symptoms: The patient states her Vasospasms have improved a little bit Does patient have uncontrolled Hypertension?: No Cardiovascular Management Strategies: Routine screening, Medication therapy, Coping strategies  Respiratory Respiratory Symptoms Reported: No symptoms reported    Endocrine Endocrine Symptoms Reported: No symptoms reported    Gastrointestinal Gastrointestinal Symptoms Reported: No symptoms reported      Genitourinary Genitourinary Symptoms Reported: No symptoms reported    Integumentary Integumentary Symptoms Reported: No symptoms reported    Musculoskeletal Musculoskelatal Symptoms Reviewed: No symptoms reported        Psychosocial Additional Psychological Details: She is looking forward to her session with BH on 10/27         There were no vitals filed for this visit.  Medications Reviewed Today     Reviewed by Moises Reusing, RN (Case Manager) on 07/08/24 at 1405  Med List Status: <None>   Medication Order Taking? Sig Documenting Provider Last Dose Status Informant  acetaminophen  (TYLENOL ) 500 MG  tablet [id number]  Take 1,000 mg by mouth every 6 (six) hours as needed for mild pain (pain score 1-3) or moderate pain (pain score 4-6). [provider]  Active Self, Pharmacy Records, Multiple Informants  Ascorbic Acid (VITAMIN C PO) [id number]  Take 1 tablet by mouth in the morning. [provider]  Active Self, Pharmacy Records, Multiple Informants           Med Note (GARNER, TIFFANY L   Fri Apr 10, 2024  3:08 PM)    aspirin  EC 81 MG tablet [id number]  Take 81 mg by mouth daily.  Swallow whole. [provider]  Active Self, Pharmacy Records, Multiple Informants  busPIRone  (BUSPAR ) 10 MG tablet [id number]  TAKE 2 TABLETS BY MOUTH 3 TIMES A DAY Lau, Grace, MD  Active Self, Pharmacy Records, Multiple Informants           Med Note STEFFI, ADELITA   Fri May 29, 2024  4:54 PM)    busPIRone  (BUSPAR ) 10 MG tablet [id number]  Take 2 tablets (20 mg total) by mouth 3 (three) times daily.   Active   clonazePAM  (KLONOPIN ) 0.5 MG tablet [id number]  Take 1 tablet (0.5 mg total) by mouth 2 (two) times daily as needed for anxiety. Corey, Evan S, MD  Active Self, Pharmacy Records, Multiple Informants           Med Note STEFFI, ADELITA   Fri May 29, 2024  4:54 PM)    clonazePAM  (KLONOPIN ) 0.5 MG tablet [id number]  Take 1 tablet (0.5 mg total) by mouth 2 (two) times daily as needed FOR ANXIETY. Corey, Evan S, MD  Active   diltiazem (CARDIZEM CD) 120 MG 24 hr capsule [id number]  Take 1 capsule (120 mg total) by mouth daily. Goodrich, Callie E, PA-C  Active   fluticasone  (FLONASE ) 50 MCG/ACT nasal spray [id number]  SPRAY 1 SPRAY INTO EACH NOSTRIL EVERY DAY [name], MD  Active Self, Pharmacy Records, Multiple Informants           Med Note STEFFI, ADELITA   Fri May 29, 2024  4:54 PM)    fluticasone  (FLONASE ) 50 MCG/ACT nasal spray [id number]  Place 1 spray into both nostrils daily.   Active   levothyroxine  (SYNTHROID ) 25 MCG tablet [id number]  TAKE 1 TABLET BY MOUTH EVERY DAY [name], MD  Active Self, Pharmacy Records, Multiple Informants           Med Note STEFFI, ADELITA   Fri May 29, 2024  4:54 PM)    metoprolol  succinate (TOPROL -XL) 25 MG 24 hr tablet [id number]  Take 0.5 tablets (12.5 mg total) by mouth daily. Waymond Cart, MD  Active   nitroGLYCERIN  (NITROSTAT ) 0.4 MG SL tablet [id number]  Place 1 tablet (0.4 mg total) under the tongue every 5 (five) minutes as needed for chest pain. Zhao, Xika, NP  Active Self, Pharmacy Records, Multiple Informants  pantoprazole   (PROTONIX ) 40 MG tablet [id number]  TAKE 1 TABLET BY MOUTH EVERY DAY [name], MD  Active Self, Pharmacy Records, Multiple Informants           Med Note STEFFI, ADELITA   Fri May 29, 2024  4:54 PM)    pantoprazole  (PROTONIX ) 40 MG tablet [id number]  Take 1 tablet (40 mg total) by mouth daily.   Active   prasugrel  (EFFIENT ) 10 MG TABS tablet [id number]  Take 1 tablet (10 mg total) by mouth daily.   Active   promethazine  (PHENERGAN ) 12.5 MG tablet [id number]  Take 1 tablet (12.5  mg total) by mouth every 6 (six) hours as needed for nausea or vomiting. Rosan Dayton BROCKS, DO  Active   traMADol  (ULTRAM ) 50 MG tablet [id number]  Take 1 tablet (50 mg total) by mouth every 6 (six) hours as needed for severe pain (pain score 7-10). Rosan Dayton BROCKS, DO  Active             Recommendation:   Goals have been met. Follow up with providers as needed  Follow Up Plan:   Closing From:  Transitions of Care Program  [facility], BSN, RN [place]  VBCI - [facility] RN Care Manager [phone]

## 2024-07-09 ENCOUNTER — Encounter: Payer: Self-pay | Admitting: *Deleted

## 2024-07-10 ENCOUNTER — Ambulatory Visit (INDEPENDENT_AMBULATORY_CARE_PROVIDER_SITE_OTHER): Admitting: Student

## 2024-07-10 VITALS — BP 124/76 | HR 67 | Temp 98.1°F | Ht 60.0 in | Wt 171.2 lb

## 2024-07-10 DIAGNOSIS — Z87891 Personal history of nicotine dependence: Secondary | ICD-10-CM | POA: Diagnosis not present

## 2024-07-10 DIAGNOSIS — R5381 Other malaise: Secondary | ICD-10-CM

## 2024-07-10 NOTE — Patient Instructions (Signed)
 Thank you so much for coming to the clinic today!   I will happily fill out the paperwork and we will get it faxed over!  If you have any questions please feel free to the call the clinic at anytime at [phone]. It was a pleasure seeing you!  Best, Dr. Annison Birchard

## 2024-07-11 DIAGNOSIS — R5381 Other malaise: Secondary | ICD-10-CM | POA: Insufficient documentation

## 2024-07-11 NOTE — Progress Notes (Signed)
 CC: FMLA  HPI:  Donna Davis is a 53 y.o. female living with a history stated below and presents today for [employer]. Please see problem based assessment and plan for additional details.  Past Medical History:  Diagnosis Date   Allergic rhinitis due to allergen 12/19/2016   Anxiety    Anxiety state 09/03/2006   Arthralgia of right temporomandibular joint 08/26/2018   Bilateral deafness 01/17/2016   Cardiac murmur 03/19/2024   Chest pain of uncertain etiology 03/19/2024   Clostridium difficile infection    x several times   Deaf    uses sign language   Elevated LDL cholesterol level 04/07/2024   Family history of breast cancer in mother 08/26/2015   Family history of malignant neoplasm of gastrointestinal tract    Frequent PVCs 03/19/2024   Gallstones    GERD 07/01/2006   Qualifier: Diagnosis of   By: Charmayne MD, Arlyss         Hearing difficulty    b/l, 2/2 congenital rubella s/p stapedectomy. Using hearing aid   Hepatic cyst    6 mm on CT done done in 05/2005   Hyperglycemia 04/07/2024   Hypothyroidism    IBS 07/01/2006   Qualifier: Diagnosis of   By: Charmayne MD, Arlyss         Low back pain 10/09/2022   Migraine 12/02/2012   Obesity    Obesity (BMI 35.0-39.9 without comorbidity) 02/18/2014   On hormone replacement therapy 04/07/2024   Pain in thoracic spine 10/09/2022   Palpitations 03/05/2024   PONV (postoperative nausea and vomiting)    sometimes; depends on how long I'm under   Routine adult health maintenance 09/01/2012   MMG - Normal 2014 - she is due and I reminded her to schedule.   Pelvic - hysterectomy, no cervix.  Sees OBGYN  Flu vaccine seasonally.      DEXA - at 53yo      Screening for colon cancer 10/22/2023   Screening mammogram for breast cancer 04/07/2024    Current Outpatient Medications on File Prior to Visit  Medication Sig Dispense Refill   acetaminophen  (TYLENOL ) 500 MG tablet Take 1,000 mg by mouth every 6 (six) hours as needed for  mild pain (pain score 1-3) or moderate pain (pain score 4-6).     Ascorbic Acid (VITAMIN C PO) Take 1 tablet by mouth in the morning.     aspirin  EC 81 MG tablet Take 81 mg by mouth daily. Swallow whole.     busPIRone  (BUSPAR ) 10 MG tablet TAKE 2 TABLETS BY MOUTH 3 TIMES A DAY 540 tablet 2   busPIRone  (BUSPAR ) 10 MG tablet Take 2 tablets (20 mg total) by mouth 3 (three) times daily. 540 tablet 2   clonazePAM  (KLONOPIN ) 0.5 MG tablet Take 1 tablet (0.5 mg total) by mouth 2 (two) times daily as needed for anxiety. 60 tablet 4   clonazePAM  (KLONOPIN ) 0.5 MG tablet Take 1 tablet (0.5 mg total) by mouth 2 (two) times daily as needed FOR ANXIETY. 60 tablet 4   diltiazem (CARDIZEM CD) 120 MG 24 hr capsule Take 1 capsule (120 mg total) by mouth daily. 30 capsule 3   fluticasone  (FLONASE ) 50 MCG/ACT nasal spray SPRAY 1 SPRAY INTO EACH NOSTRIL EVERY DAY 16 mL 2   fluticasone  (FLONASE ) 50 MCG/ACT nasal spray Place 1 spray into both nostrils daily. 16 g 2   levothyroxine  (SYNTHROID ) 25 MCG tablet TAKE 1 TABLET BY MOUTH EVERY DAY 90 tablet 3   metoprolol   succinate (TOPROL -XL) 25 MG 24 hr tablet Take 0.5 tablets (12.5 mg total) by mouth daily. 15 tablet 0   nitroGLYCERIN  (NITROSTAT ) 0.4 MG SL tablet Place 1 tablet (0.4 mg total) under the tongue every 5 (five) minutes as needed for chest pain. 25 tablet 2   pantoprazole  (PROTONIX ) 40 MG tablet TAKE 1 TABLET BY MOUTH EVERY DAY 90 tablet 3   pantoprazole  (PROTONIX ) 40 MG tablet Take 1 tablet (40 mg total) by mouth daily. 90 tablet 3   prasugrel  (EFFIENT ) 10 MG TABS tablet Take 1 tablet (10 mg total) by mouth daily. 30 tablet 2   promethazine  (PHENERGAN ) 12.5 MG tablet Take 1 tablet (12.5 mg total) by mouth every 6 (six) hours as needed for nausea or vomiting. 30 tablet 0   traMADol  (ULTRAM ) 50 MG tablet Take 1 tablet (50 mg total) by mouth every 6 (six) hours as needed for severe pain (pain score 7-10). 40 tablet 0   No current facility-administered medications  on file prior to visit.    Family History  Problem Relation Age of Onset   Breast cancer Mother    Ovarian cancer Mother    Stroke Father    Neuropathy Father    Diabetes Father    Heart disease Father        heart attack   Irritable bowel syndrome Father    Kidney disease Father    Stomach cancer Maternal Grandmother    Colon cancer Maternal Grandmother    Diabetes Maternal Grandmother    Bipolar disorder Daughter    Heart disease Other        both grandmothers   Esophageal cancer Neg Hx    Liver disease Neg Hx     Social History   Socioeconomic History   Marital status: Married    Spouse name: Not on file   Number of children: 2   Years of education: Not on file   Highest education level: Not on file  Occupational History   Occupation: immunologist    Comment: works in [place]   Tobacco Use   Smoking status: Former    Current packs/day: 0.00    Average packs/day: 1 pack/day for 26.0 years (26.0 ttl pk-yrs)    Types: Cigarettes    Start date: 02/17/1981    Quit date: 02/18/2007    Years since quitting: 17.4   Smokeless tobacco: Never   Tobacco comments:    quit 2008  Vaping Use   Vaping status: Never Used  Substance and Sexual Activity   Alcohol use: Not Currently    Comment: Very Rarely   Drug use: No   Sexual activity: Not Currently    Partners: Male    Birth control/protection: Surgical  Other Topics Concern   Not on file  Social History Narrative   Current smoker, not using alcohol or drugs at this timeLives with husband and 2 children      Are you right handed or left handed? Right Handed    Are you currently employed ? 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 No    Who lives with you? Husband and 85 pound dog    What type of home do you live in: 1 story or 2 story? Lives in a one story home        Social Drivers of Health   Financial Resource Strain: Not on file  Food Insecurity: No Food  Insecurity (06/03/2024)  Hunger Vital Sign    Worried About Running Out of Food in the Last Year: Never true    Ran Out of Food in the Last Year: Never true  Transportation Needs: No Transportation Needs (06/03/2024)   PRAPARE - Administrator, Civil Service (Medical): No    Lack of Transportation (Non-Medical): No  Physical Activity: Not on file  Stress: Not on file  Social Connections: Not on file  Intimate Partner Violence: Not At Risk (06/03/2024)   Humiliation, Afraid, Rape, and Kick questionnaire    Fear of Current or Ex-Partner: No    Emotionally Abused: No    Physically Abused: No    Sexually Abused: No    Review of Systems: ROS negative except for what is noted on the assessment and plan.  Vitals:   07/10/24 0914  BP: 124/76  Pulse: 67  Temp: 98.1 F (36.7 C)  TempSrc: Oral  SpO2: 99%  Weight: 171 lb 3.2 oz (77.7 kg)  Height: 5' (1.524 m)    Physical Exam: Constitutional: well-appearing female, deaf,  in no acute distress Cardiovascular: regular rate and rhythm, no m/r/g Pulmonary/Chest: normal work of breathing on room air, lungs clear to auscultation bilaterally Assessment & Plan:   Physical deconditioning Patient with multiple recent hospitalizations, most recently for an NSTEMI for which she was requiring to go to cardiac rehab 3 times a week for and is coming into the clinic to discuss FMLA paperwork.  She is also requiring multiple doctors appointments, and is driving restrictions due to her recent NSTEMI.  Will fill out required documentation.    Patient discussed with Dr. CHARLENA Rosan Roetta Nelia, M.D. [facility], PGY-3 Pager: [phone] Date 07/11/2024 Time 8:48 PM

## 2024-07-11 NOTE — Assessment & Plan Note (Signed)
 Patient with multiple recent hospitalizations, most recently for an NSTEMI for which she was requiring to go to cardiac rehab 3 times a week for and is coming into the clinic to discuss FMLA paperwork.  She is also requiring multiple doctors appointments, and is driving restrictions due to her recent NSTEMI.  Will fill out required documentation.

## 2024-07-13 ENCOUNTER — Ambulatory Visit: Admitting: Licensed Clinical Social Worker

## 2024-07-16 ENCOUNTER — Telehealth: Payer: Self-pay | Admitting: *Deleted

## 2024-07-16 NOTE — Progress Notes (Signed)
 Internal Medicine Clinic Attending  Case discussed with the resident at the time of the visit.  We reviewed the resident's history and exam and pertinent patient test results.  I agree with the assessment, diagnosis, and plan of care documented in the resident's note.

## 2024-07-16 NOTE — Telephone Encounter (Signed)
 Copied from CRM [id number]. Topic: General - Other >> Jul 16, 2024  1:10 PM Miquel SAILOR wrote: Reason for CRM: FMLA paperwork not filled out correctly from visit 10/24. Nees cal abk for clarification what needs to be done. [phone]

## 2024-07-21 ENCOUNTER — Encounter: Payer: Self-pay | Admitting: Student

## 2024-07-22 NOTE — Telephone Encounter (Signed)
 I contacted pt to let her know that her form has been revised and emailed to her. Advised pt to call back if she need further assistance.

## 2024-07-23 NOTE — Progress Notes (Deleted)
 Chief Complaint: Primary GI MD: Dr. Stacia  HPI: 53 year old female history of anxiety, migraines, IBS and congenital deafness presents for follow-up  Last seen 10/23/2023 by Dr. Stacia.  At that time reported IBS alternating diarrhea and constipation since age 15 with worsening symptoms over the last 6 months   Discussed the use of AI scribe software for clinical note transcription with the patient, who gave verbal consent to proceed.  History of Present Illness      PREVIOUS GI WORKUP   CT abdomen with contrast June 14, 2020 Indication: Nausea/vomiting IMPRESSION: 1. No acute process in the abdomen. No explanation for patient's symptoms. 2. Aortic Atherosclerosis (ICD10-I70.0).   CT abdomen/pelvis with contrast May 20, 2019 Indication: Acute generalized abdominal pain IMPRESSION: 1. No evidence of acute abnormalities within the solid abdominal organs. 2. Asymmetric enlarged less than 1 cm in short axis mesenteric lymph nodes in the right lower quadrant of the abdomen. These are nonspecific and may represent reactive lymph nodes, or mesenteric adenitis. Early malignant lymphadenopathy cannot be entirely excluded.   CT abdomen/pelvis with contrast July 23, 2017 Indication: Abdominal pain with nausea, vomiting, diarrhea IMPRESSION: 1. Normal appendix. 2. Right lower quadrant mesenteric lymph nodes raising the question of mesenteric adenitis. 3. No bowel obstruction or abscess. 4. Status post cholecystectomy and hysterectomy   CT abdomen/pelvis with contrast March 04, 2016 Indication: Acute generalized abdominal pain IMPRESSION: 1. No CT evidence for acute intra-abdominal or pelvic process. 2. Status post cholecystectomy and hysterectomy.   CT abdomen/pelvis with contrast June 12, 2015 Indication: Right-sided abdominal pain with nausea and vomiting IMPRESSION: Negative for appendicitis.  Negative exam   CT abdomen/pelvis with contrast  September 29, 2013 Indication: Right lower quadrant pain IMPRESSION:  1. No acute abnormalities involving the abdomen or pelvis. Moderate  stool burden.  2. Diverticulum arising from the medial descending duodenum, at or  near the insertion of the common bile duct. No biliary ductal  dilation    CT abdomen/pelvis with contrast July 01, 2013 Indication: Right-sided abdominal pain IMPRESSION:  1. Small bilateral pleural effusions with right base atelectasis.  2. No evidence for intra-abdominal or pelvic fluid collection to  suggest abscess or biloma.  3. Postoperative change from cholecystectomy.    Right upper quadrant ultrasound June 13, 2013 Indication: Right upper quadrant abdominal pain IMPRESSION:  1. Cholelithiasis without acute cholecystitis.  2. Common bile duct upper normal to minimally dilated for age.  Correlate with bilirubin levels. If these are elevated, consider  further characterization with contrast-enhanced CT or MRCP   CT abdomen/pelvis without contrast March 14, 2013 Indication: Left-sided pain IMPRESSION:  No acute finding.  No evidence of urinary tract stone disease.  No  cause of pain identified    CT abdomen/pelvis with contrast October 23, 2012 Indication: Right lower quadrant pain IMPRESSION:  Negative for appendicitis. No acute finding.    Abdominal ultrasound February 29, 2012 Indication: Abdominal pain, nausea IMPRESSION:  Negative abdominal ultrasound    Abdominal ultrasound December 01, 2010 Indication: Abdominal pain IMPRESSION:  Negative abdominal ultrasound.    There are at least 15 additional abdominal imaging studies prior to these.     Endoscopic history   Colonoscopy 01/2024 Dr. Stacia Indication: Diarrhea/screening - One 3 mm polyp in the cecum, removed with a cold snare. Resected and retrieved.  - One 4 mm polyp in the ascending colon, removed with a cold snare. Resected and retrieved.  - Mild diverticulosis in the sigmoid  colon and in the descending colon.  -  The examined portion of the ileum was normal.  - The distal rectum and anal verge are normal on retroflexion view. - biopsies (tubular adenomas)  Colonoscopy 2013 Dr. Jakie Indication: Diarrhea, screening Normal colonoscopy Random biopsies, normal   EGD 2013 Dr. Jakie Indication: diarrhea Normal endoscopic exam Gastric and duodenal biopsies taken to rule out H. pylori and celiac disease   Colonoscopy November 16, 2006 Dr. Aneita Indication: Diarrhea Normal colonoscopy   EGD November 15, 2006 Dr. Obie Indication: Abdominal pain Normal upper endoscopy   Colonoscopy Feb 04, 2006 Indication: Constipation, diarrhea, abdominal pain Normal colonoscopy normal terminal ileum   EGD September 19, 1998 Indication: Abdominal pain Normal endoscopy   Colonoscopy June 07, 1995 Indication: Abdominal pain Normal colonoscopy   Flexible sigmoidoscopy August 31, 1991 Indication: Alternating constipation and diarrhea Normal flexible sigmoidoscopy to 30 cm  Past Medical History:  Diagnosis Date   Allergic rhinitis due to allergen 12/19/2016   Anxiety    Anxiety state 09/03/2006   Arthralgia of right temporomandibular joint 08/26/2018   Bilateral deafness 01/17/2016   Cardiac murmur 03/19/2024   Chest pain of uncertain etiology 03/19/2024   Clostridium difficile infection    x several times   Deaf    uses sign language   Elevated LDL cholesterol level 04/07/2024   Family history of breast cancer in mother 08/26/2015   Family history of malignant neoplasm of gastrointestinal tract    Frequent PVCs 03/19/2024   Gallstones    GERD 07/01/2006   Qualifier: Diagnosis of   By: Charmayne MD, Arlyss         Hearing difficulty    b/l, 2/2 congenital rubella s/p stapedectomy. Using hearing aid   Hepatic cyst    6 mm on CT done done in 05/2005   Hyperglycemia 04/07/2024   Hypothyroidism    IBS 07/01/2006   Qualifier: Diagnosis of   By: Charmayne MD,  Arlyss         Low back pain 10/09/2022   Migraine 12/02/2012   Obesity    Obesity (BMI 35.0-39.9 without comorbidity) 02/18/2014   On hormone replacement therapy 04/07/2024   Pain in thoracic spine 10/09/2022   Palpitations 03/05/2024   PONV (postoperative nausea and vomiting)    sometimes; depends on how long I'm under   Routine adult health maintenance 09/01/2012   MMG - Normal 2014 - she is due and I reminded her to schedule.   Pelvic - hysterectomy, no cervix.  Sees OBGYN  Flu vaccine seasonally.      DEXA - at 53yo      Screening for colon cancer 10/22/2023   Screening mammogram for breast cancer 04/07/2024    Past Surgical History:  Procedure Laterality Date   ABDOMINAL ADHESION SURGERY     Enterolysis   CESAREAN SECTION  1994   CHOLECYSTECTOMY N/A 06/26/2013   Procedure: LAPAROSCOPIC CHOLECYSTECTOMY WITH ATTEMPTED INTRAOPERATIVE CHOLANGIOGRAM;  Surgeon: Vicenta DELENA Poli, MD;  Location: [facility];  Service: General;  Laterality: N/A;   COCHLEAR IMPLANT  03/30/2016   COCHLEAR IMPLANT Right 03/2015   COLONOSCOPY WITH PROPOFOL   04/23/2012   CORONARY PRESSURE/FFR STUDY N/A 06/01/2024   Procedure: CORONARY PRESSURE/FFR STUDY;  Surgeon: Mady Bruckner, MD;  Location: [facility];  Service: Cardiovascular;  Laterality: N/A;   CORONARY PRESSURE/FFR WITH 3D MAPPING N/A 04/20/2024   Procedure: Coronary Pressure/FFR w/3D Mapping;  Surgeon: Mady Bruckner, MD;  Location: [facility];  Service: Cardiovascular;  Laterality: N/A;   CORONARY STENT INTERVENTION N/A 04/22/2024  Procedure: CORONARY STENT INTERVENTION;  Surgeon: Darron Deatrice LABOR, MD;  Location: [facility];  Service: Cardiovascular;  Laterality: N/A;   CORONARY ULTRASOUND/IVUS N/A 04/22/2024   Procedure: Coronary Ultrasound/IVUS;  Surgeon: Darron Deatrice LABOR, MD;  Location: [facility];  Service: Cardiovascular;  Laterality: N/A;  RCA   DIAGNOSTIC LAPAROSCOPY  01/14/2006   I've had several  (07/23/2017)   ESOPHAGOGASTRODUODENOSCOPY (EGD) WITH PROPOFOL   04/23/2012   JOINT REPLACEMENT     KNEE SURGERY Right 2014   LAPAROSCOPIC OVARIAN CYSTECTOMY Right 04/16/2000   LEFT HEART CATH AND CORONARY ANGIOGRAPHY N/A 04/20/2024   Procedure: LEFT HEART CATH AND CORONARY ANGIOGRAPHY;  Surgeon: Mady Bruckner, MD;  Location: [facility];  Service: Cardiovascular;  Laterality: N/A;   LEFT HEART CATH AND CORONARY ANGIOGRAPHY N/A 06/01/2024   Procedure: LEFT HEART CATH AND CORONARY ANGIOGRAPHY;  Surgeon: Mady Bruckner, MD;  Location: [facility];  Service: Cardiovascular;  Laterality: N/A;   SALPINGOOPHORECTOMY Left    STAPEDECTOMY Left 05/14/2006   Thyroid  Nodule removed  1997   TONSILLECTOMY     TOTAL ABDOMINAL HYSTERECTOMY  01/13/2004   Endometriosis with residual right ovary. Multiple GYN surgeries for chronic pelvic pain; had LSO in past   TUBAL LIGATION     WOUND EXPLORATION Right 10/16/2016   Procedure: RIGHT EXPLORATION OF LACERATION OF INDEX FINGER;  Surgeon: Franky Curia, MD;  Location: [facility];  Service: Orthopedics;  Laterality: Right;    Current Outpatient Medications  Medication Sig Dispense Refill   acetaminophen  (TYLENOL ) 500 MG tablet Take 1,000 mg by mouth every 6 (six) hours as needed for mild pain (pain score 1-3) or moderate pain (pain score 4-6).     Ascorbic Acid (VITAMIN C PO) Take 1 tablet by mouth in the morning.     aspirin  EC 81 MG tablet Take 81 mg by mouth daily. Swallow whole.     busPIRone  (BUSPAR ) 10 MG tablet TAKE 2 TABLETS BY MOUTH 3 TIMES A DAY 540 tablet 2   busPIRone  (BUSPAR ) 10 MG tablet Take 2 tablets (20 mg total) by mouth 3 (three) times daily. 540 tablet 2   clonazePAM  (KLONOPIN ) 0.5 MG tablet Take 1 tablet (0.5 mg total) by mouth 2 (two) times daily as needed for anxiety. 60 tablet 4   clonazePAM  (KLONOPIN ) 0.5 MG tablet Take 1 tablet (0.5 mg total) by mouth 2 (two) times daily as needed FOR ANXIETY. 60 tablet 4    diltiazem (CARDIZEM CD) 120 MG 24 hr capsule Take 1 capsule (120 mg total) by mouth daily. 30 capsule 3   fluticasone  (FLONASE ) 50 MCG/ACT nasal spray SPRAY 1 SPRAY INTO EACH NOSTRIL EVERY DAY 16 mL 2   fluticasone  (FLONASE ) 50 MCG/ACT nasal spray Place 1 spray into both nostrils daily. 16 g 2   levothyroxine  (SYNTHROID ) 25 MCG tablet TAKE 1 TABLET BY MOUTH EVERY DAY 90 tablet 3   metoprolol  succinate (TOPROL -XL) 25 MG 24 hr tablet Take 0.5 tablets (12.5 mg total) by mouth daily. 15 tablet 0   nitroGLYCERIN  (NITROSTAT ) 0.4 MG SL tablet Place 1 tablet (0.4 mg total) under the tongue every 5 (five) minutes as needed for chest pain. 25 tablet 2   pantoprazole  (PROTONIX ) 40 MG tablet TAKE 1 TABLET BY MOUTH EVERY DAY 90 tablet 3   pantoprazole  (PROTONIX ) 40 MG tablet Take 1 tablet (40 mg total) by mouth daily. 90 tablet 3   prasugrel  (EFFIENT ) 10 MG TABS tablet Take 1 tablet (10 mg total) by  mouth daily. 30 tablet 2   promethazine  (PHENERGAN ) 12.5 MG tablet Take 1 tablet (12.5 mg total) by mouth every 6 (six) hours as needed for nausea or vomiting. 30 tablet 0   traMADol  (ULTRAM ) 50 MG tablet Take 1 tablet (50 mg total) by mouth every 6 (six) hours as needed for severe pain (pain score 7-10). 40 tablet 0   No current facility-administered medications for this visit.    Allergies as of 07/24/2024 - Review Complete 07/08/2024  Allergen Reaction Noted   Onion Anaphylaxis 11/28/2010   Amitriptyline  Anxiety, Palpitations, and Other (See Comments) 04/08/2013   Amoxicillin -pot clavulanate Nausea And Vomiting    Bacitracin Hives and Other (See Comments) 07/11/2023   Cefuroxime Nausea And Vomiting 08/06/2008   Cephalexin  Nausea And Vomiting    Ciprofloxacin  Other (See Comments)    Clarithromycin Nausea And Vomiting and Other (See Comments)    Corticosteroids Other (See Comments) 05/29/2024   Doxycycline  Hives and Nausea And Vomiting    Isosorbide  mononitrate [isosorbide  nitrate] Other (See Comments)  04/23/2024   Ondansetron  Other (See Comments) 01/20/2018   Propranolol  Other (See Comments) 01/07/2013   Sulfa antibiotics Other (See Comments) 03/04/2016   Sulfonamide derivatives Other (See Comments)    Benadryl  [diphenhydramine  hcl (sleep)] Anxiety 06/26/2013    Family History  Problem Relation Age of Onset   Breast cancer Mother    Ovarian cancer Mother    Stroke Father    Neuropathy Father    Diabetes Father    Heart disease Father        heart attack   Irritable bowel syndrome Father    Kidney disease Father    Stomach cancer Maternal Grandmother    Colon cancer Maternal Grandmother    Diabetes Maternal Grandmother    Bipolar disorder Daughter    Heart disease Other        both grandmothers   Esophageal cancer Neg Hx    Liver disease Neg Hx     Social History   Socioeconomic History   Marital status: Married    Spouse name: Not on file   Number of children: 2   Years of education: Not on file   Highest education level: Not on file  Occupational History   Occupation: immunologist    Comment: works in [place]   Tobacco Use   Smoking status: Former    Current packs/day: 0.00    Average packs/day: 1 pack/day for 26.0 years (26.0 ttl pk-yrs)    Types: Cigarettes    Start date: 02/17/1981    Quit date: 02/18/2007    Years since quitting: 17.4   Smokeless tobacco: Never   Tobacco comments:    quit 2008  Vaping Use   Vaping status: Never Used  Substance and Sexual Activity   Alcohol use: Not Currently    Comment: Very Rarely   Drug use: No   Sexual activity: Not Currently    Partners: Male    Birth control/protection: Surgical  Other Topics Concern   Not on file  Social History Narrative   Current smoker, not using alcohol or drugs at this timeLives with husband and 2 children      Are you right handed or left handed? Right Handed    Are you currently employed ? 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 No    Who lives with you? Husband and 85 pound dog    What type of home do  you live in: 1 story or 2 story? Lives in a one story home        Social Drivers of Health   Financial Resource Strain: Not on file  Food Insecurity: No Food Insecurity (06/03/2024)   Hunger Vital Sign    Worried About Running Out of Food in the Last Year: Never true    Ran Out of Food in the Last Year: Never true  Transportation Needs: No Transportation Needs (06/03/2024)   PRAPARE - Administrator, Civil Service (Medical): No    Lack of Transportation (Non-Medical): No  Physical Activity: Not on file  Stress: Not on file  Social Connections: Not on file  Intimate Partner Violence: Not At Risk (06/03/2024)   Humiliation, Afraid, Rape, and Kick questionnaire    Fear of Current or Ex-Partner: No    Emotionally Abused: No    Physically Abused: No    Sexually Abused: No    Review of Systems:    Constitutional: No weight loss, fever, chills, weakness or fatigue HEENT: Eyes: No change in vision               Ears, Nose, Throat:  No change in hearing or congestion Skin: No rash or itching Cardiovascular: No chest pain, chest pressure or palpitations   Respiratory: No SOB or cough Gastrointestinal: See HPI and otherwise negative Genitourinary: No dysuria or change in urinary frequency Neurological: No headache, dizziness or syncope Musculoskeletal: No new muscle or joint pain Hematologic: No bleeding or bruising Psychiatric: No history of depression or anxiety    Physical Exam:  Vital signs: LMP 12/26/2004   Constitutional: NAD, alert and cooperative Head:  Normocephalic and atraumatic. Eyes:   PEERL, EOMI. No icterus. Conjunctiva pink. Respiratory: Respirations even and unlabored. Lungs clear to auscultation bilaterally.   No wheezes, crackles, or rhonchi.  Cardiovascular:  Regular rate and rhythm. No peripheral edema, cyanosis or pallor.  Gastrointestinal:  Soft, nondistended,  nontender. No rebound or guarding. Normal bowel sounds. No appreciable masses or hepatomegaly. Rectal:  Declines Msk:  Symmetrical without gross deformities. Without edema, no deformity or joint abnormality.  Neurologic:  Alert and  oriented x4;  grossly normal neurologically.  Skin:   Dry and intact without significant lesions or rashes. Psychiatric: Oriented to person, place and time. Demonstrates good judgement and reason without abnormal affect or behaviors.  Physical Exam    RELEVANT LABS AND IMAGING: CBC    Component Value Date/Time   WBC 6.7 06/19/2024 1015   WBC 7.0 06/02/2024 1107   RBC 4.38 06/19/2024 1015   RBC 4.17 06/02/2024 1107   HGB 13.4 06/19/2024 1015   HCT 40.4 06/19/2024 1015   PLT 291 06/19/2024 1015   MCV 92 06/19/2024 1015   MCH 30.6 06/19/2024 1015   MCH 30.5 06/02/2024 1107   MCHC 33.2 06/19/2024 1015   MCHC 33.9 06/02/2024 1107   RDW 12.4 06/19/2024 1015   LYMPHSABS 3.5 05/29/2024 1356   LYMPHSABS 2.5 06/30/2021 1140   MONOABS 0.6 05/29/2024 1356   EOSABS 0.2 05/29/2024 1356   EOSABS 0.2 06/30/2021 1140   BASOSABS 0.0 05/29/2024 1356   BASOSABS 0.0 06/30/2021 1140    CMP     Component Value Date/Time   NA 135 06/02/2024 1107   NA 143 05/31/2022 0954   K 3.6 06/02/2024 1107   CL 105 06/02/2024 1107   CO2 20 (L) 06/02/2024 1107   GLUCOSE 84 06/02/2024 1107   BUN 8 06/02/2024 1107   BUN 11  05/31/2022 0954   CREATININE 0.87 06/02/2024 1107   CREATININE 0.68 02/17/2014 0916   CALCIUM  9.4 06/02/2024 1107   PROT 7.3 05/29/2024 1356   PROT 7.2 05/31/2022 0954   ALBUMIN 4.2 05/29/2024 1356   ALBUMIN 4.5 05/31/2022 0954   AST 21 05/29/2024 1356   ALT 15 05/29/2024 1356   ALKPHOS 42 05/29/2024 1356   BILITOT 0.6 05/29/2024 1356   BILITOT <0.2 05/31/2022 0954   GFRNONAA >60 06/02/2024 1107   GFRNONAA >89 02/17/2014 0916   GFRAA >60 06/14/2020 1152   GFRAA >89 02/17/2014 0916     Assessment/Plan:   53 year old female history of  anxiety, migraines, chronic IBS with extensive ED visits including over a dozen CT scans, multiple ultrasounds, and multiple previous EGD/colonoscopies presenting for follow-up of worsening abdominal pain and diarrhea/constipation  IBS, diarrhea predominant Bentyl  partially effective in the past but caused significant constipation.  Adverse reaction to amitriptyline .  Last visit 10/2023 Bentyl  was reintroduced at a lower dose (10 mg as needed) and recommended low FODMAP diet.  Colonoscopy 01/2024 with 2 small tubular adenomas.  Biopsies were not taken for microscopic colitis. - Add on colestipol to Bentyl  10 mg as needed - Consider rifaximin if no improvement with above regimen - Not a candidate for Viberzi given cholecystectomy status  Colon cancer screening Colonoscopy 01/2024 with 2 small tubular adenomas and recall of 7 years - Due for repeat 01/2031)   Nestor Mollie RIGGERS [facility] 07/23/2024, 11:19 AM  Cc: [name], MD

## 2024-07-24 ENCOUNTER — Ambulatory Visit: Admitting: Gastroenterology

## 2024-07-26 ENCOUNTER — Encounter (HOSPITAL_BASED_OUTPATIENT_CLINIC_OR_DEPARTMENT_OTHER): Payer: Self-pay | Admitting: Cardiology

## 2024-07-26 DIAGNOSIS — R0683 Snoring: Secondary | ICD-10-CM

## 2024-07-28 ENCOUNTER — Ambulatory Visit: Attending: Student

## 2024-07-28 ENCOUNTER — Ambulatory Visit (INDEPENDENT_AMBULATORY_CARE_PROVIDER_SITE_OTHER): Admitting: Licensed Clinical Social Worker

## 2024-07-28 DIAGNOSIS — R0683 Snoring: Secondary | ICD-10-CM

## 2024-07-28 DIAGNOSIS — Z566 Other physical and mental strain related to work: Secondary | ICD-10-CM | POA: Diagnosis not present

## 2024-07-28 NOTE — Procedures (Signed)
   SLEEP STUDY REPORT Patient Information Study Date: 07/26/2024 Patient Name: Donna Davis Patient ID: [id number] Birth Date: 18-May-1971 Age: 53 Gender: Female Referring Physician: Aline Door, NP  TEST DESCRIPTION: Home sleep apnea testing was completed using the WatchPat, a Type 1 device, utilizing peripheral arterial tonometry (PAT), chest movement, actigraphy, pulse oximetry, pulse rate, body position and snore. AHI was calculated with apnea and hypopnea using valid sleep time as the denominator. RDI includes apneas, hypopneas, and RERAs. The data acquired and the scoring of sleep and all associated events were performed in accordance with the recommended standards and specifications as outlined in the AASM Manual for the Scoring of Sleep and Associated Events 2.2.0 (2015).  FINDINGS: 1. No evidence of Obstructive Sleep Apnea with AHI 1.6/hr. 2. No Central Sleep Apnea. 3. Oxygen desaturations as low as 90%. 4. Mild to moderate snoring was present. O2 sats were < 88% for 0 minutes. 5. Total sleep time was 8 hrs and 49 min. 6. 34.3% of total sleep time was spent in REM sleep. 7. Normal sleep onset latency at 16 min. 8. Normal REM sleep onset latency at 99 min. 9. Total awakenings were 4.  DIAGNOSIS: Normal study with no significant sleep disordered breathing.  RECOMMENDATIONS: 1. Normal study with no significant sleep disordered breathing. 2. Healthy sleep recommendations include: adequate nightly sleep (normal 7-9 hrs/night), avoidance of caffeine  after noon and alcohol near bedtime, and maintaining a sleep environment that is cool, dark and quiet. 3. Weight loss for overweight patients is recommended. 4. Snoring recommendations include: weight loss where appropriate, side sleeping, and avoidance of alcohol before bed. 5. Operation of motor vehicle or dangerous equipment must be avoided when feeling drowsy, excessively sleepy, or mentally fatigued. 6. An ENT consultation  which may be useful for specific causes of and possible treatment of bothersome snoring . 7. Weight loss may be of benefit in reducing the severity of snoring.    Signature: Wilbert Bihari, MD; [facility]; Diplomat, American Board of Sleep Medicine Electronically Signed: 07/28/2024 8:06:49 PM

## 2024-07-28 NOTE — BH Specialist Note (Signed)
 Integrated Behavioral Health Follow Up In-Person Visit  MRN: [id number] Name: Donna Davis  Number of Integrated Behavioral Health Clinician visits: Additional Visit  Session Start time: 1330   Session End time: 1430  Total time in minutes: 60    Types of Service: Individual psychotherapy and General Behavioral Integrated Care (BHI)  Interpretor:Yes.   Interpretor Name and Language: Sign Language     Subjective: Donna Davis is a 53 y.o. female accompanied by Interpreter Patient was referred by PCP for Counseling. 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 Therapist explained that work-related stress can lead to emotional and physical symptoms, including frustration, anxiety, fatigue, and difficulty concentrating. Patient's reactions and concerns about the workplace environment were normalized as a common experience when organizational support is lacking.  Coping strategies were discussed, including identifying specific stress triggers, practicing relaxation techniques such as deep breathing and mindfulness, journaling about experiences, and prioritizing tasks to manage workload. Patient was encouraged to reach out to supportive colleagues and consider communicating with supervisors or human resources when possible to advocate for accommodations. Therapist reinforced the importance of self-care, setting boundaries, and seeking external support if work stress becomes overwhelming. The patient was receptive to these interventions and agreed to practice coping skills before the next session.   Duration of problem: More than a year; Severity of problem: mild   Objective: Mood: Anxious and Affect: Appropriate Risk of harm to self or others: No plan to harm self or others   Life Context: Family and Social: Married School/Work:  Employed     Patient and/or Family's Strengths/Protective Factors: Social connections   Goals Addressed: Patient will: Reduce symptoms of: stress     Progress towards Goals: Ongoing   Interventions: Interventions utilized: Supportive Counseling  Standardized Assessments completed: PHQ-SADS         Patient and/or Family Response: Patient agrees to counseling   Patient Centered Plan: Patient is on the following Treatment Plan(s):  Bi-Weekly   Clinical Assessment/Diagnosis   Stress at work    Assessment: Patient currently experiencing Stress.   Patient may benefit from Ongoing Counseling.   Plan: Follow up with behavioral health clinician on : Patient will contact agency to schedule follow up   Renda Pontes, MSW, LCSW-A She/Her Behavioral Health Clinician [facility]

## 2024-07-30 ENCOUNTER — Other Ambulatory Visit: Payer: Self-pay

## 2024-08-03 ENCOUNTER — Telehealth: Payer: Self-pay | Admitting: *Deleted

## 2024-08-03 NOTE — Telephone Encounter (Signed)
 The patient has been notified of the result via her mychart.

## 2024-08-03 NOTE — Telephone Encounter (Signed)
-----   Message from Donna Davis sent at 07/28/2024  8:09 PM EST ----- Please let patient know that sleep study showed no significant sleep apnea.

## 2024-08-03 NOTE — Progress Notes (Unsigned)
 Cardiology Office Note:    Date:  08/04/2024   ID:  Donna Davis [birth year], MRN [id number]  PCP:  [name], MD   [facility] Providers Cardiologist:  Ozell Fell, MD     Referring MD: [name], MD   Chief Complaint  Patient presents with   Chest Pain   Follow-up    History of Present Illness:    Donna Davis is a 53 y.o. female with a hx of coronary artery disease, presenting for follow-up evaluation.  The patient has a history of multivessel CAD and underwent stent implantation in the right coronary artery and left circumflex vessels in August 2025.  She was readmitted in September 2025 for recurrent chest pain and was noted to have patent stents and moderate diffuse mid to distal LAD stenosis.  Medications were titrated and she was started on amlodipine  2.5 mg daily.  When seen in follow-up, she continued to have problems with angina.  She was started on diltiazem at that time and amlodipine  was discontinued.  She was continued on metoprolol  succinate.  The patient is interviewed with the assistance of a sign language interpreter.  She continues to have severe angina.  She complains of central and left-sided chest pressure that occurs at rest and intensifies with exertion.  She has been able to walk 2 miles on a regular basis but does experience angina with her walks.  She has to stop and rest at times.  During severe episodes she complains of diaphoresis.  There is no shortness of breath, lightheadedness, or syncope.  She has become quite frustrated with her symptoms which are very lifestyle limiting for her.  She has been tried on multiple medications as outlined above.  She had a severe migraine headache with a low-dose of isosorbide  and is unable to tolerate nitrates.  She has not appreciated any significant improvement since changing from amlodipine  to diltiazem.   Current Medications: Current Meds  Medication Sig   acetaminophen  (TYLENOL ) 500 MG tablet Take  1,000 mg by mouth every 6 (six) hours as needed for mild pain (pain score 1-3) or moderate pain (pain score 4-6).   Ascorbic Acid (VITAMIN C PO) Take 1 tablet by mouth in the morning.   aspirin  EC 81 MG tablet Take 81 mg by mouth daily. Swallow whole.   busPIRone  (BUSPAR ) 10 MG tablet Take 2 tablets (20 mg total) by mouth 3 (three) times daily.   clonazePAM  (KLONOPIN ) 0.5 MG tablet Take 1 tablet (0.5 mg total) by mouth 2 (two) times daily as needed FOR ANXIETY.   diltiazem (CARDIZEM CD) 120 MG 24 hr capsule Take 1 capsule (120 mg total) by mouth daily.   fluticasone  (FLONASE ) 50 MCG/ACT nasal spray SPRAY 1 SPRAY INTO EACH NOSTRIL EVERY DAY   levothyroxine  (SYNTHROID ) 25 MCG tablet TAKE 1 TABLET BY MOUTH EVERY DAY   metoprolol  succinate (TOPROL -XL) 25 MG 24 hr tablet Take 0.5 tablets (12.5 mg total) by mouth daily.   nitroGLYCERIN  (NITROSTAT ) 0.4 MG SL tablet Place 1 tablet (0.4 mg total) under the tongue every 5 (five) minutes as needed for chest pain.   pantoprazole  (PROTONIX ) 40 MG tablet Take 1 tablet (40 mg total) by mouth daily.   prasugrel  (EFFIENT ) 10 MG TABS tablet Take 1 tablet (10 mg total) by mouth daily.   promethazine  (PHENERGAN ) 12.5 MG tablet Take 1 tablet (12.5 mg total) by mouth every 6 (six) hours as needed for nausea or vomiting.   ranolazine (RANEXA) 500 MG 12  hr tablet Take 1 tablet (500 mg total) by mouth 2 (two) times daily.   traMADol  (ULTRAM ) 50 MG tablet Take 1 tablet (50 mg total) by mouth every 6 (six) hours as needed for severe pain (pain score 7-10).     Allergies:   Onion, Amitriptyline , Amoxicillin -pot clavulanate, Bacitracin, Cefuroxime, Cephalexin , Ciprofloxacin , Clarithromycin, Corticosteroids, Doxycycline , Isosorbide  mononitrate [isosorbide  nitrate], Ondansetron , Propranolol , Sulfa antibiotics, Sulfonamide derivatives, and Benadryl  [diphenhydramine  hcl (sleep)]   ROS:   Please see the history of present illness.    All other systems reviewed and are  negative.  EKGs/Labs/Other Studies Reviewed:    The following studies were reviewed today: Cardiac Studies & Procedures   ______________________________________________________________________________________________ CARDIAC CATHETERIZATION  CARDIAC CATHETERIZATION 06/01/2024  Conclusion Conclusions: Stable appearance of the coronary arteries following recent PCI's to the LCx and RCA with widely patent stents.  Long segment of 50-60% stenosis in the mid/distal LAD appears unchanged and is mildly significant by hemodynamic assessment (RFR = 0.90, FFR = 0.75). Normal microvascular function (CFR 5.8, IMR 6). Coronary vasospasm of the RCA noted, which resolved with intracoronary nitroglycerin . Low left ventricular filling pressure (LVEDP 3 mmHg).  Recommendations: Continue dual antiplatelet therapy with aspirin  and prasugrel  for 12 months from PCI's to LCx and RCA in 04/2024. Add amlodipine  for treatment of coronary vasospasm, as the patient has been intolerant to long-acting nitrates. Aggressive secondary prevention of coronary artery disease. If severe chest pain at rest persists, consider further evaluation for alternative etiologies of chest pain, which seems out of proportion to her burden of CAD.  Lonni Hanson, MD [facility]  Findings Coronary Findings Diagnostic  Dominance: Right  Left Main Vessel is large. Vessel is angiographically normal.  Left Anterior Descending Vessel is large. Mid LAD to Dist LAD lesion is 55% stenosed. The lesion is irregular. Pressure gradient was performed on the lesion using a GUIDEWIRE PRESSURE X 175 and CATH LAUNCHER 6FR EBU3.5. FFR: 0.75. RFR: 0.9.  First Diagonal Branch Vessel is moderate in size. 1st Diag lesion is 50% stenosed.  Second Diagonal Branch Vessel is small in size.  Third Diagonal Branch Vessel is small in size.  Left Circumflex Vessel is moderate in size. Mid Cx lesion is 30% stenosed. Non-stenotic Mid Cx to Dist  Cx lesion was previously treated. The lesion is not complex (non high-C). Virtual FFR: 0.62.  First Obtuse Marginal Branch Vessel is moderate in size.  Second Obtuse Marginal Branch Vessel is moderate in size.  Third Obtuse Marginal Branch Vessel is moderate in size.  Right Coronary Artery Vessel is moderate in size. Non-stenotic Prox RCA lesion was previously treated. Virtual FFR: 0.55. IVUS was performed after balloon angioplasty which showed extensive soft plaque in the midsegment with moderate diffuse disease extending all the way to the ostium of the vessel. Based on this, I elected to stent all the way back to the ostium. Non-stenotic Mid RCA lesion was previously treated. Virtual FFR: 0.55. Dist RCA lesion is 40% stenosed.  Right Posterior Descending Artery Vessel is moderate in size.  Right Posterior Atrioventricular Artery Vessel is moderate in size.  First Right Posterolateral Branch Vessel is small in size.  Second Right Posterolateral Branch Vessel is small in size.  Third Right Posterolateral Branch Vessel is small in size.  Intervention  No interventions have been documented.   CARDIAC CATHETERIZATION  CARDIAC CATHETERIZATION 04/22/2024  Conclusion   Mid LAD to Dist LAD lesion is 55% stenosed.   Mid RCA lesion is 65% stenosed.   1st Diag lesion is 50%  stenosed.   Prox RCA lesion is 75% stenosed.   Dist RCA lesion is 40% stenosed.   Mid Cx to Dist Cx lesion is 90% stenosed.   Mid Cx lesion is 30% stenosed.   A drug-eluting stent was successfully placed using a STENT SYNERGY XD [id number].   A drug-eluting stent was successfully placed using a STENT SYNERGY XD 2.50X24.   Post intervention, there is a 0% residual stenosis.   Post intervention, there is a 0% residual stenosis.   Post intervention, there is a 0% residual stenosis.  1.  Mildly elevated left ventricular end-diastolic pressure at 18 mmHg. 2.  Successful IVUS guided PCI and drug-eluting stent  placement to the right coronary artery extending to the ostium.  A 2.5 mm stent was placed and postdilated to a 3.0 proximally. 3.  Successful angioplasty and drug-eluting stent placement to the mid/distal left circumflex.  The stent partially jailed in OM branch which had normal flow and minimal stenosis at the ostium. 4.  The patient required high doses of sedatives and narcotics.  Recommendations: Dual antiplatelet therapy for at least 6 months. Aggressive treatment of risk factors.  Findings Coronary Findings Diagnostic  Dominance: Right  Left Main Vessel is large. Vessel is angiographically normal.  Left Anterior Descending Vessel is large. Mid LAD to Dist LAD lesion is 55% stenosed. The lesion is irregular. Virtual FFR: 0.59.  First Diagonal Branch Vessel is moderate in size. 1st Diag lesion is 50% stenosed.  Second Diagonal Branch Vessel is small in size.  Third Diagonal Branch Vessel is small in size.  Left Circumflex Vessel is moderate in size. Mid Cx lesion is 30% stenosed. Mid Cx to Dist Cx lesion is 90% stenosed. The lesion is not complex (non high-C). The lesion was not previously treated . Virtual FFR: 0.62.  First Obtuse Marginal Branch Vessel is moderate in size.  Second Obtuse Marginal Branch Vessel is moderate in size.  Third Obtuse Marginal Branch Vessel is moderate in size.  Right Coronary Artery Vessel is moderate in size. Prox RCA lesion is 75% stenosed. Virtual FFR: 0.55. Ultrasound (IVUS) was performed. Severe plaque burden was detected. IVUS was performed after balloon angioplasty which showed extensive soft plaque in the midsegment with moderate diffuse disease extending all the way to the ostium of the vessel.  Based on this, I elected to stent all the way back to the ostium. Mid RCA lesion is 65% stenosed. Virtual FFR: 0.55. Dist RCA lesion is 40% stenosed.  Right Posterior Descending Artery Vessel is moderate in size.  Right Posterior  Atrioventricular Artery Vessel is moderate in size.  First Right Posterolateral Branch Vessel is small in size.  Second Right Posterolateral Branch Vessel is small in size.  Third Right Posterolateral Branch Vessel is small in size.  Intervention  Mid Cx to Dist Cx lesion Stent Lesion length:  20 mm. CATH LAUNCHER 6FR EBU3.5 guide catheter was inserted. Lesion crossed with guidewire using a WIRE RUNTHROUGH .[id number]. Pre-stent angioplasty was performed using a BALLOON EMERGE MR 2.5X12. Maximum pressure:  10 atm. Inflation time:  20 sec. A drug-eluting stent was successfully placed using a STENT SYNERGY XD 2.50X24. Maximum pressure: 14 atm. Post-stent angioplasty was performed using a BALLOON SAPPHIRE NC24 [id number]. Maximum pressure:  14 atm. Inflation time:  20 sec. Post-Intervention Lesion Assessment The intervention was successful. Pre-interventional TIMI flow is 3. Post-intervention TIMI flow is 3. No complications occurred at this lesion. There is a 0% residual stenosis post intervention.  Prox RCA lesion Stent (  Also treats lesions: Mid RCA) Lesion length:  40 mm. CATH VISTA GUIDE 6FR JR4 ECOPK guide catheter was inserted. Lesion crossed with guidewire using a WIRE RUNTHROUGH .[id number]. Pre-stent angioplasty was performed using a BALLOON EMERGE MR 2.5X12. Maximum pressure:  8 atm. Inflation time:  30 sec. A drug-eluting stent was successfully placed using a STENT SYNERGY XD [id number]. Maximum pressure: 16 atm. Inflation time: 20 sec. Post-stent angioplasty was performed using a BALLOON SAPPHIRE NC24 3.0X26. Maximum pressure:  14 atm. Inflation time:  20 sec. Post-Intervention Lesion Assessment The intervention was successful. Pre-interventional TIMI flow is 3. Post-intervention TIMI flow is 3. No complications occurred at this lesion. There is a 0% residual stenosis post intervention.  Mid RCA lesion Stent (Also treats lesions: Prox RCA) See details in Prox RCA  lesion. Post-Intervention Lesion Assessment The intervention was successful. Pre-interventional TIMI flow is 3. Post-intervention TIMI flow is 3. No complications occurred at this lesion. There is a 0% residual stenosis post intervention.   STRESS TESTS  ECHOCARDIOGRAM STRESS TEST 04/17/2024  Narrative EXERCISE STRESS ECHO REPORT   --------------------------------------------------------------------------------  Patient Name:   Donna Davis  Date of Exam: 04/17/2024 Medical Rec #:  [id number]     Height:       60.0 in Accession #:    [id number]    Weight:       186.2 lb Date of Birth:  [birth year]      BSA:          1.811 m Patient Age:    53 years      BP:           131/86 mmHg Patient Gender: F             HR:           83 bpm. Exam Location:  [address]  Procedure: Stress Echo and Intracardiac Opacification Agent  Indications:    R07.9 Chest pain  History:        Patient has prior history of Echocardiogram examinations, most recent 03/27/2024.  Sonographer:    Elsie Bohr RDCS Referring Phys: CYRUS JENNIFER SAUNDERS [facility]   Sonographer Comments: Technically difficult study due to poor echo windows. IMPRESSIONS   1. This is a negative stress echocardiogram for ischemia. 2. This is a low risk study. 3. Grossly normal wall motion with stress however image quality is reduced due to suboptimal acoustic windows for imaging. If symptoms persist, consider alternate imaging modality for stress.  FINDINGS  Exam Protocol: The patient exercised on a treadmill according to a Bruce protocol. Definity  contrast agent was given IV to delineate the left ventricular endocardial borders.   Patient Performance: The patient exercised for 7 minutes achieving 8 METS. The maximum stage achieved was III of the Bruce protocol. The baseline heart rate was 83 bpm. The heart rate at peak stress was 166 bpm. The target heart rate was calculated to be 140 bpm. The percentage of maximum predicted heart  rate achieved was 100.8 %. The baseline blood pressure was 131/86 mmHg. The blood pressure at peak stress was 192/72 mmHg. The blood pressure response was normal. The patient developed chest pain and nausea during the stress exam. The symptoms did not resolve. The patient's functional capacity was average.  EKG: Resting EKG showed normal sinus rhythm with no abnormal findings. The patient developed upsloping ST depressions and beat to beat ST depressions, nonspecific for ischemia during exercise.   2D Echo Findings: The baseline ejection fraction was 65%. The peak ejection  fraction at stress was 70%. Baseline regional wall motion abnormalities were not present. There were no stress-induced wall motion abnormalities. This is a negative stress echocardiogram for ischemia.   Soyla Merck MD Electronically signed on 04/17/2024 at 5:07:32 PM     Final   ECHOCARDIOGRAM  ECHOCARDIOGRAM LIMITED 05/31/2024  Narrative ECHOCARDIOGRAM LIMITED REPORT    Patient Name:   Donna Davis Date of Exam: 05/31/2024 Medical Rec #:  [id number]    Height:       60.0 in Accession #:    [id number]   Weight:       183.9 lb Date of Birth:  08/26/1971     BSA:          1.801 m Patient Age:    53 years     BP:           114/64 mmHg Patient Gender: F            HR:           71 bpm. Exam Location:  Inpatient  Procedure: Limited Color Doppler, Color Doppler and Cardiac Doppler (Both Spectral and Color Flow Doppler were utilized during procedure).  Indications:    Chest Pain R09  History:        Patient has prior history of Echocardiogram examinations, most recent 04/17/2024.  Sonographer:    Tinnie Gosling RDCS Referring Phys: [id number] ZANE ADAMS  IMPRESSIONS   1. Left ventricular ejection fraction, by estimation, is 55 to 60%. The left ventricle has normal function. Left ventricular endocardial border not optimally defined to evaluate regional wall motion. 2. Right ventricular systolic function is normal.  The right ventricular size is normal. Tricuspid regurgitation signal is inadequate for assessing PA pressure. 3. The mitral valve is normal in structure. No evidence of mitral valve regurgitation. No evidence of mitral stenosis. 4. The aortic valve was not well visualized. Aortic valve regurgitation is not visualized. No aortic stenosis is present. 5. The inferior vena cava is normal in size with greater than 50% respiratory variability, suggesting right atrial pressure of 3 mmHg.  FINDINGS Left Ventricle: Left ventricular ejection fraction, by estimation, is 55 to 60%. The left ventricle has normal function. Left ventricular endocardial border not optimally defined to evaluate regional wall motion. The left ventricular internal cavity size was normal in size. There is no left ventricular hypertrophy.  Right Ventricle: The right ventricular size is normal. No increase in right ventricular wall thickness. Right ventricular systolic function is normal. Tricuspid regurgitation signal is inadequate for assessing PA pressure.  Left Atrium: Left atrial size was normal in size.  Right Atrium: Right atrial size was not well visualized.  Pericardium: There is no evidence of pericardial effusion.  Mitral Valve: The mitral valve is normal in structure. No evidence of mitral valve stenosis.  Tricuspid Valve: The tricuspid valve is normal in structure. Tricuspid valve regurgitation is not demonstrated. No evidence of tricuspid stenosis.  Aortic Valve: The aortic valve was not well visualized. Aortic valve regurgitation is not visualized. No aortic stenosis is present.  Pulmonic Valve: The pulmonic valve was not well visualized. Pulmonic valve regurgitation is not visualized. No evidence of pulmonic stenosis.  Aorta: The aortic root is normal in size and structure.  Venous: The inferior vena cava is normal in size with greater than 50% respiratory variability, suggesting right atrial pressure of 3  mmHg.  IAS/Shunts: No atrial level shunt detected by color flow Doppler.  LEFT VENTRICLE PLAX 2D LVIDd:  4.30 cm LVIDs:         3.00 cm LV PW:         1.00 cm LV IVS:        1.10 cm   LEFT ATRIUM         Index LA diam:    3.30 cm 1.83 cm/m AORTIC VALVE LVOT Vmax:   58.30 cm/s LVOT Vmean:  38.100 cm/s LVOT VTI:    0.142 m  MITRAL VALVE MV Area (PHT): 3.56 cm    SHUNTS MV Decel Time: 213 msec    Systemic VTI: 0.14 m MV E velocity: 62.90 cm/s MV A velocity: 81.80 cm/s MV E/A ratio:  0.77  Wilbert Bihari MD Electronically signed by Wilbert Bihari MD Signature Date/Time: 05/31/2024/11:26:01 AM    Final    MONITORS  LONG TERM MONITOR (3-14 DAYS) 04/10/2024  Narrative Patch Wear Time:  14 days and 0 hours (2025-07-03T15:45:54-0400 to 2025-07-17T15:45:54-0400)  Patient had a min HR of 53 bpm, max HR of 152 bpm, and avg HR of 75 bpm.  Predominant underlying rhythm was Sinus Rhythm.  9 Supraventricular Tachycardia runs occurred, the run with the fastest interval lasting 4 beats with a max rate of 152 bpm, the longest lasting 15 beats with an avg rate of 97 bpm. Isolated SVEs were rare (<1.0%), SVE Couplets were rare (<1.0%), and SVE Triplets were rare (<1.0%).  Isolated VEs were rare (<1.0%), VE Couplets were rare (<1.0%), and no VE Triplets were present. Ventricular Bigeminy and Trigeminy were present.  Impression: Mildly abnormal but largely unremarkable event monitor.  Brief atrial runs noted.       ______________________________________________________________________________________________      EKG:        Recent Labs: 03/05/2024: Magnesium  2.0 05/29/2024: ALT 15 06/02/2024: BUN 8; Creatinine, Ser 0.87; Potassium 3.6; Sodium 135 06/09/2024: TSH 2.170 06/19/2024: Hemoglobin 13.4; Platelets 291  Recent Lipid Panel    Component Value Date/Time   CHOL 185 04/22/2024 0331   TRIG 122 04/22/2024 0331   HDL 52 04/22/2024 0331   CHOLHDL 3.6 04/22/2024 0331    VLDL 24 04/22/2024 0331   LDLCALC 109 (H) 04/22/2024 0331     Risk Assessment/Calculations:                Physical Exam:    VS:  BP 111/72 (BP Location: Left Arm, Patient Position: Sitting, Cuff Size: Large)   Pulse 76   Resp 16   Ht 5' (1.524 m)   Wt 167 lb (75.8 kg)   LMP 12/26/2004   SpO2 98%   BMI 32.61 kg/m     Wt Readings from Last 3 Encounters:  08/04/24 167 lb (75.8 kg)  07/10/24 171 lb 3.2 oz (77.7 kg)  06/30/24 174 lb (78.9 kg)     GEN:  Well nourished, well developed in no acute distress HEENT: Normal NECK: No JVD; No carotid bruits LYMPHATICS: No lymphadenopathy CARDIAC: RRR, no murmurs, rubs, gallops RESPIRATORY:  Clear to auscultation without rales, wheezing or rhonchi  ABDOMEN: Soft, non-tender, non-distended MUSCULOSKELETAL:  No edema; No deformity  SKIN: Warm and dry NEUROLOGIC:  Alert and oriented x 3 PSYCHIATRIC:  Normal affect   Assessment & Plan Coronary artery disease involving native coronary artery of native heart with angina pectoris The patient continues to experience severe lifestyle-limiting anginal symptoms on medical therapy with metoprolol  succinate and diltiazem as well as antiplatelet therapy with Effient  and aspirin .  She is intolerant to statin drugs.  Pending referral to lipid clinic.  I recommended  the addition of ranolazine 500 mg twice daily.  I recommended a cardiac PET stress to try to isolate her ischemia and help direct any further revascularization efforts.  I went back and reviewed her most recent catheterization which demonstrated stent patency in the circumflex and RCA without any high-grade obstructive disease and there is moderate diffuse LAD plaquing, likely of hemodynamic significance.  However, I think it is important to define the severity of obstruction and ischemia as I suspect her only treatment option would be a LIMA to LAD graft.  The patient has done a great job with lifestyle modification with significant  weight loss and positive dietary changes as well as initiation of a walking program. Hyperlipidemia LDL goal <70 Patient has an appointment scheduled tomorrow with Dr. Mona.  Suspect she will be a good candidate for PCSK9 inhibitor or inclisiran.     Cardiac Rehabilitation Eligibility Assessment       Informed Consent   Shared Decision Making/Informed Consent The risks [chest pain, shortness of breath, cardiac arrhythmias, dizziness, blood pressure fluctuations, myocardial infarction, stroke/transient ischemic attack, nausea, vomiting, allergic reaction, radiation exposure, metallic taste sensation and life-threatening complications (estimated to be 1 in 10,000)], benefits (risk stratification, diagnosing coronary artery disease, treatment guidance) and alternatives of a cardiac PET stress test were discussed in detail with Ms. Masterson and she agrees to proceed.       Medication Adjustments/Labs and Tests Ordered: Current medicines are reviewed at length with the patient today.  Concerns regarding medicines are outlined above.  Orders Placed This Encounter  Procedures   NM PET CT CARDIAC PERFUSION MULTI W/ABSOLUTE BLOODFLOW   Meds ordered this encounter  Medications   ranolazine (RANEXA) 500 MG 12 hr tablet    Sig: Take 1 tablet (500 mg total) by mouth 2 (two) times daily.    Dispense:  60 tablet    Refill:  3    Patient Instructions  Medication Instructions:  START Ranolazine 500 mg twice daily   *If you need a refill on your cardiac medications before your next appointment, please call your pharmacy*  Lab Work: None ordered today. If you have labs (blood work) drawn today and your tests are completely normal, you will receive your results only by: MyChart Message (if you have MyChart) OR A paper copy in the mail If you have any lab test that is abnormal or we need to change your treatment, we will call you to review the results.  Testing/Procedures: Your provider has  requested that you have a cardiac PET/CT stress test. Our office will be calling you to schedule this test at a later time.  Follow-Up: At [facility], you and your health needs are our priority.  As part of our continuing mission to provide you with exceptional heart care, our providers are all part of one team.  This team includes your primary Cardiologist (physician) and Advanced Practice Providers or APPs (Physician Assistants and Nurse Practitioners) who all work together to provide you with the care you need, when you need it.  Your next appointment:   3 month(s)  Provider:   One of our Advanced Practice Providers (APPs): Morse Clause, PA-C  Lamarr Satterfield, NP Miriam Shams, NP  Olivia Pavy, PA-C Josefa Beauvais, NP  Leontine Salen, PA-C Orren Fabry, PA-C  [place], PA-C Ernest Dick, NP  Damien Braver, NP Jon Hails, PA-C  Waddell Donath, PA-C    Dayna Dunn, PA-C  Glendia Ferrier, PA-C Lum Louis, NP Katlyn West, NP Callie  Jadine, PA-C  [name], NP Thom Sluder, PA-C    Kathleen Johnson, PA-C   We recommend signing up for the patient portal called MyChart.  Sign up information is provided on this After Visit Summary.  MyChart is used to connect with patients for Virtual Visits (Telemedicine).  Patients are able to view lab/test results, encounter notes, upcoming appointments, etc.  Non-urgent messages can be sent to your provider as well.   To learn more about what you can do with MyChart, go to [email or web address].   Other Instructions How to Prepare for Your Cardiac PET/CT Stress Test:  1. Please do not take these medications before your test:  ~Medications that may interfere with the cardiac pharmacological stress agent (ex. nitrates - including erectile dysfunction medications, isosorbide  mononitrate, tamulosin or beta-blockers) the day of the exam. (Erectile dysfunction medication should be held for at least 72 hrs prior to test) ~Theophylline  containing medications for 12 hours. ~Dipyridamole 48 hours prior to the test. ~Your remaining medications may be taken with water . ~HOLD your Metoprolol  Succinate (Toprol -XL) the morning of this scan  2. Nothing to eat or drink, except water , 3 hours prior to arrival time.   ~ NO caffeine /decaffeinated products, or chocolate 12 hours prior to arrival.  3. NO perfume, cologne or lotion on chest or abdomen area.         - FEMALES - Please avoid wearing dresses to this appointment.  4. Total time is 1 to 2 hours; you may want to bring reading material for the waiting time.  Please report to Radiology at the [facility] Main Entrance 30 minutes early for your test. [address] [place], [place] [address]   In preparation for your appointment, medication and supplies will be purchased.  Appointment availability is limited, so if you need to cancel or reschedule, please call the Radiology Department at [phone] [facility]) 24 hours in advance to avoid a cancellation fee of $100.00  What to Expect After you Arrive:  Once you arrive and check in for your appointment, you will be taken to a preparation room within the Radiology Department.  A technologist or Nurse will obtain your medical history, verify that you are correctly prepped for the exam, and explain the procedure.  Afterwards,  an IV will be started in your arm and electrodes will be placed on your skin for EKG monitoring during the stress portion of the exam. Then you will be escorted to the PET/CT scanner.  There, staff will get you positioned on the scanner and obtain a blood pressure and EKG.  During the exam, you will continue to be connected to the EKG and blood pressure machines.  A small, safe amount of a radioactive tracer will be injected in your IV to obtain a series of pictures of your heart along with an injection of a stress agent.    After your Exam:  It is recommended that you eat a meal and drink a  caffeinated beverage to counter act any effects of the stress agent.  Drink plenty of fluids for the remainder of the day and urinate frequently for the first couple of hours after the exam.  Your doctor will inform you of your test results within 7-10 business days.  For more information and frequently asked questions, please visit our website : [email or web address]  For questions about your test or how to prepare for your test, please call: Cardiac Imaging Nurse Navigators Office: [phone]      Signed,  Ozell Fell, MD  08/04/2024 9:59 AM    [facility]

## 2024-08-04 ENCOUNTER — Other Ambulatory Visit: Payer: Self-pay

## 2024-08-04 ENCOUNTER — Other Ambulatory Visit: Payer: Self-pay | Admitting: Physician Assistant

## 2024-08-04 ENCOUNTER — Encounter: Payer: Self-pay | Admitting: Cardiovascular Disease

## 2024-08-04 ENCOUNTER — Other Ambulatory Visit (HOSPITAL_COMMUNITY): Payer: Self-pay

## 2024-08-04 ENCOUNTER — Encounter (HOSPITAL_BASED_OUTPATIENT_CLINIC_OR_DEPARTMENT_OTHER): Payer: Self-pay

## 2024-08-04 ENCOUNTER — Ambulatory Visit: Attending: Cardiovascular Disease | Admitting: Cardiovascular Disease

## 2024-08-04 VITALS — BP 111/72 | HR 76 | Resp 16 | Ht 60.0 in | Wt 167.0 lb

## 2024-08-04 DIAGNOSIS — E785 Hyperlipidemia, unspecified: Secondary | ICD-10-CM | POA: Diagnosis not present

## 2024-08-04 DIAGNOSIS — I25119 Atherosclerotic heart disease of native coronary artery with unspecified angina pectoris: Secondary | ICD-10-CM

## 2024-08-04 MED ORDER — RANOLAZINE ER 500 MG PO TB12
500.0000 mg | ORAL_TABLET | Freq: Two times a day (BID) | ORAL | 3 refills | Status: DC
  Filled 2024-08-04: qty 60, 30d supply, fill #0

## 2024-08-04 NOTE — BH Specialist Note (Signed)
 Integrated Behavioral Health Initial In-Person Visit  MRN: [id number] Name: Donna Davis  Number of Integrated Behavioral Health Clinician visits: 1- Initial Visit  Session Start time: 1530    Session End time: 1600  Total time in minutes: 30    Types of Service: Introduction only  Interpretor:Yes.   Interpretor Name and Language: Sign Language   Subjective: Donna Davis is a 53 y.o. female accompanied by Interpreter Patient was referred by PCP for Counseling. Patient reports the following symptoms/concerns: The Licensed Clinical Engineer, [employer] (LCSW-A), acting as a Visual Merchandiser [facility]), initiated a session with patient. The [facility] introduced themselves, explained her role, and provided contact information to the patient. Confidentiality and mandated reporting were discussed, and the patient denied any suicidal ideations or intent to harm others. The Integrated Behavioral Health (IBH) approach was reviewed, and a PHQ-9 assessment was completed.Patient agreed to counseling services.  Duration of problem: More than a year; Severity of problem: mild  Objective: Mood: Anxious and Affect: Appropriate Risk of harm to self or others: No plan to harm self or others  Life Context: Family and Social: Married School/Work: Employed   Patient and/or Family's Strengths/Protective Factors: Social connections  Goals Addressed: Patient will: Reduce symptoms of: stress   Progress towards Goals: Ongoing  Interventions: Interventions utilized: Supportive Counseling  Standardized Assessments completed: PHQ-SADS     06/25/2024    4:51 PM 06/25/2024    4:50 PM 06/17/2024    2:38 PM  PHQ-SADS Last 3 Score only  Total GAD-7 Score 2  8  PHQ Adolescent Score  5         Patient and/or Family Response: Patient agrees to counseling  Patient Centered Plan: Patient is on the following Treatment Plan(s):  Bi-Weekly  Clinical Assessment/Diagnosis  Stress at work    Assessment: Patient currently experiencing Stress.   Patient may benefit from Ongoing Counseling.  Plan: Follow up with behavioral health clinician on : Within two weeks  Renda Pontes, MSW, LCSW-A She/Her Behavioral Health Clinician [facility]

## 2024-08-04 NOTE — Assessment & Plan Note (Signed)
 The patient continues to experience severe lifestyle-limiting anginal symptoms on medical therapy with metoprolol  succinate and diltiazem as well as antiplatelet therapy with Effient  and aspirin .  She is intolerant to statin drugs.  Pending referral to lipid clinic.  I recommended the addition of ranolazine 500 mg twice daily.  I recommended a cardiac PET stress to try to isolate her ischemia and help direct any further revascularization efforts.  I went back and reviewed her most recent catheterization which demonstrated stent patency in the circumflex and RCA without any high-grade obstructive disease and there is moderate diffuse LAD plaquing, likely of hemodynamic significance.  However, I think it is important to define the severity of obstruction and ischemia as I suspect her only treatment option would be a LIMA to LAD graft.  The patient has done a great job with lifestyle modification with significant weight loss and positive dietary changes as well as initiation of a walking program.

## 2024-08-04 NOTE — Patient Instructions (Addendum)
 Medication Instructions:  START Ranolazine 500 mg twice daily   *If you need a refill on your cardiac medications before your next appointment, please call your pharmacy*  Lab Work: None ordered today. If you have labs (blood work) drawn today and your tests are completely normal, you will receive your results only by: MyChart Message (if you have MyChart) OR A paper copy in the mail If you have any lab test that is abnormal or we need to change your treatment, we will call you to review the results.  Testing/Procedures: Your provider has requested that you have a cardiac PET/CT stress test. Our office will be calling you to schedule this test at a later time.  Follow-Up: At [facility], you and your health needs are our priority.  As part of our continuing mission to provide you with exceptional heart care, our providers are all part of one team.  This team includes your primary Cardiologist (physician) and Advanced Practice Providers or APPs (Physician Assistants and Nurse Practitioners) who all work together to provide you with the care you need, when you need it.  Your next appointment:   3 month(s)  Provider:   One of our Advanced Practice Providers (APPs): Morse Clause, PA-C  Lamarr Satterfield, NP Miriam Shams, NP  Olivia Pavy, PA-C Josefa Beauvais, NP  Leontine Salen, PA-C Orren Fabry, PA-C  Hao Meng, PA-C Ernest Dick, NP  Damien Braver, NP Jon Hails, PA-C  Waddell Donath, PA-C    Dayna Dunn, PA-C  Scott Weaver, PA-C Lum Louis, NP Katlyn West, NP Callie Goodrich, PA-C  [name], NP Sheng Haley, PA-C    Kathleen Johnson, PA-C   We recommend signing up for the patient portal called MyChart.  Sign up information is provided on this After Visit Summary.  MyChart is used to connect with patients for Virtual Visits (Telemedicine).  Patients are able to view lab/test results, encounter notes, upcoming appointments, etc.  Non-urgent messages can be sent to your  provider as well.   To learn more about what you can do with MyChart, go to [email or web address].   Other Instructions How to Prepare for Your Cardiac PET/CT Stress Test:  1. Please do not take these medications before your test:  ~Medications that may interfere with the cardiac pharmacological stress agent (ex. nitrates - including erectile dysfunction medications, isosorbide  mononitrate, tamulosin or beta-blockers) the day of the exam. (Erectile dysfunction medication should be held for at least 72 hrs prior to test) ~Theophylline containing medications for 12 hours. ~Dipyridamole 48 hours prior to the test. ~Your remaining medications may be taken with water . ~HOLD your Metoprolol  Succinate (Toprol -XL) the morning of this scan  2. Nothing to eat or drink, except water , 3 hours prior to arrival time.   ~ NO caffeine /decaffeinated products, or chocolate 12 hours prior to arrival.  3. NO perfume, cologne or lotion on chest or abdomen area.         - FEMALES - Please avoid wearing dresses to this appointment.  4. Total time is 1 to 2 hours; you may want to bring reading material for the waiting time.  Please report to Radiology at the [facility] Main Entrance 30 minutes early for your test. [address] [place], [place] [address]   In preparation for your appointment, medication and supplies will be purchased.  Appointment availability is limited, so if you need to cancel or reschedule, please call the Radiology Department at [phone] [facility]) 24 hours in advance to avoid  a cancellation fee of $100.00  What to Expect After you Arrive:  Once you arrive and check in for your appointment, you will be taken to a preparation room within the Radiology Department.  A technologist or Nurse will obtain your medical history, verify that you are correctly prepped for the exam, and explain the procedure.  Afterwards,  an IV will be started in your arm and electrodes will  be placed on your skin for EKG monitoring during the stress portion of the exam. Then you will be escorted to the PET/CT scanner.  There, staff will get you positioned on the scanner and obtain a blood pressure and EKG.  During the exam, you will continue to be connected to the EKG and blood pressure machines.  A small, safe amount of a radioactive tracer will be injected in your IV to obtain a series of pictures of your heart along with an injection of a stress agent.    After your Exam:  It is recommended that you eat a meal and drink a caffeinated beverage to counter act any effects of the stress agent.  Drink plenty of fluids for the remainder of the day and urinate frequently for the first couple of hours after the exam.  Your doctor will inform you of your test results within 7-10 business days.  For more information and frequently asked questions, please visit our website : [email or web address]  For questions about your test or how to prepare for your test, please call: Cardiac Imaging Nurse Navigators Office: [phone]

## 2024-08-04 NOTE — Patient Instructions (Signed)
 SABRA

## 2024-08-05 ENCOUNTER — Ambulatory Visit (INDEPENDENT_AMBULATORY_CARE_PROVIDER_SITE_OTHER): Admitting: Internal Medicine

## 2024-08-05 ENCOUNTER — Encounter (HOSPITAL_BASED_OUTPATIENT_CLINIC_OR_DEPARTMENT_OTHER): Payer: Self-pay | Admitting: Internal Medicine

## 2024-08-05 VITALS — BP 130/80 | HR 73 | Ht 60.0 in | Wt 169.8 lb

## 2024-08-05 DIAGNOSIS — M791 Myalgia, unspecified site: Secondary | ICD-10-CM

## 2024-08-05 DIAGNOSIS — E785 Hyperlipidemia, unspecified: Secondary | ICD-10-CM

## 2024-08-05 DIAGNOSIS — T466X5A Adverse effect of antihyperlipidemic and antiarteriosclerotic drugs, initial encounter: Secondary | ICD-10-CM

## 2024-08-05 DIAGNOSIS — T466X5D Adverse effect of antihyperlipidemic and antiarteriosclerotic drugs, subsequent encounter: Secondary | ICD-10-CM

## 2024-08-05 DIAGNOSIS — I25119 Atherosclerotic heart disease of native coronary artery with unspecified angina pectoris: Secondary | ICD-10-CM | POA: Diagnosis not present

## 2024-08-05 NOTE — Progress Notes (Unsigned)
 LIPID CLINIC CONSULT NOTE  Chief Complaint:  Dyslipidemia  Primary Care Physician: [name], MD  Primary Cardiologist:  Ozell Fell, MD  HPI:  Donna Davis is a 53 y.o. female who is being seen today for the evaluation of dyslipidemia at the request of Garrel Fell, MD.  This is a pleasant 53 year old female who is kindly referred for evaluation management of dyslipidemia.  She is accompanied by a professional sign language interpreter today.  She was seen yesterday by Dr. Fell for ongoing chest pain symptoms.  She has a history of coronary artery disease with prior PCI as well as dyslipidemia and statin intolerance.  She does report some later onset heart disease in the family including her father who had heart attack related to uncontrolled diabetes at an older age.  She previously has been on several statin medications which cause myalgias.  Recently she has made substantial dietary changes and lifestyle adjustments to lose weight and has lost more than 30 pounds.  Lab work prior to this showed a total cholesterol 185 with triglycerides 122, HDL 52 and LDL 109.  She has not had repeat lab work since then.  Given her aggressive coronary artery disease, I would recommend a target LDL less than 55.  It was suggested that she might benefit from a PCSK9 inhibitor.  PMHx:  Past Medical History:  Diagnosis Date   Allergic rhinitis due to allergen 12/19/2016   Anxiety    Anxiety state 09/03/2006   Arrhythmia 02/2024   Arthralgia of right temporomandibular joint 08/26/2018   Bilateral deafness 01/17/2016   Cardiac murmur 03/19/2024   Chest pain of uncertain etiology 03/19/2024   Clostridium difficile infection    x several times   Coronary artery disease 03/2024   2 stents in place medication for the 3rd blockage   Deaf    uses sign language   Elevated LDL cholesterol level 04/07/2024   Family history of breast cancer in mother 08/26/2015   Family history of malignant neoplasm  of gastrointestinal tract    Frequent PVCs 03/19/2024   Gallstones    GERD 07/01/2006   Qualifier: Diagnosis of   By: Charmayne MD, Arlyss         Hearing difficulty    b/l, 2/2 congenital rubella s/p stapedectomy. Using hearing aid   Hepatic cyst    6 mm on CT done done in 05/2005   Hyperglycemia 04/07/2024   Hyperlipidemia 02/2024   Hypertension 02/2024   Controlrd with medication   Hypothyroidism    IBS 07/01/2006   Qualifier: Diagnosis of   By: Charmayne MD, Arlyss         Low back pain 10/09/2022   Migraine 12/02/2012   Obesity    Obesity (BMI 35.0-39.9 without comorbidity) 02/18/2014   On hormone replacement therapy 04/07/2024   Pain in thoracic spine 10/09/2022   Palpitations 03/05/2024   PONV (postoperative nausea and vomiting)    sometimes; depends on how long I'm under   Routine adult health maintenance 09/01/2012   MMG - Normal 2014 - she is due and I reminded her to schedule.   Pelvic - hysterectomy, no cervix.  Sees OBGYN  Flu vaccine seasonally.      DEXA - at 53yo      Screening for colon cancer 10/22/2023   Screening mammogram for breast cancer 04/07/2024   Syncope and collapse 03/2024    Past Surgical History:  Procedure Laterality Date   ABDOMINAL ADHESION SURGERY  Enterolysis   CESAREAN SECTION  1994   CHOLECYSTECTOMY N/A 06/26/2013   Procedure: LAPAROSCOPIC CHOLECYSTECTOMY WITH ATTEMPTED INTRAOPERATIVE CHOLANGIOGRAM;  Surgeon: Vicenta DELENA Poli, MD;  Location: [facility];  Service: General;  Laterality: N/A;   COCHLEAR IMPLANT  03/30/2016   COCHLEAR IMPLANT Right 03/2015   COLONOSCOPY WITH PROPOFOL   04/23/2012   CORONARY ANGIOPLASTY  04/2024   CORONARY PRESSURE/FFR STUDY N/A 06/01/2024   Procedure: CORONARY PRESSURE/FFR STUDY;  Surgeon: Mady Bruckner, MD;  Location: [facility];  Service: Cardiovascular;  Laterality: N/A;   CORONARY PRESSURE/FFR WITH 3D MAPPING N/A 04/20/2024   Procedure: Coronary Pressure/FFR w/3D Mapping;  Surgeon: Mady Bruckner,  MD;  Location: [facility];  Service: Cardiovascular;  Laterality: N/A;   CORONARY STENT INTERVENTION N/A 04/22/2024   Procedure: CORONARY STENT INTERVENTION;  Surgeon: Darron Deatrice DELENA, MD;  Location: [facility];  Service: Cardiovascular;  Laterality: N/A;   CORONARY ULTRASOUND/IVUS N/A 04/22/2024   Procedure: Coronary Ultrasound/IVUS;  Surgeon: Darron Deatrice DELENA, MD;  Location: [facility];  Service: Cardiovascular;  Laterality: N/A;  RCA   DIAGNOSTIC LAPAROSCOPY  01/14/2006   I've had several (07/23/2017)   ESOPHAGOGASTRODUODENOSCOPY (EGD) WITH PROPOFOL   04/23/2012   JOINT REPLACEMENT     KNEE SURGERY Right 2014   LAPAROSCOPIC OVARIAN CYSTECTOMY Right 04/16/2000   LEFT HEART CATH AND CORONARY ANGIOGRAPHY N/A 04/20/2024   Procedure: LEFT HEART CATH AND CORONARY ANGIOGRAPHY;  Surgeon: Mady Bruckner, MD;  Location: [facility];  Service: Cardiovascular;  Laterality: N/A;   LEFT HEART CATH AND CORONARY ANGIOGRAPHY N/A 06/01/2024   Procedure: LEFT HEART CATH AND CORONARY ANGIOGRAPHY;  Surgeon: Mady Bruckner, MD;  Location: [facility];  Service: Cardiovascular;  Laterality: N/A;   SALPINGOOPHORECTOMY Left    STAPEDECTOMY Left 05/14/2006   Thyroid  Nodule removed  1997   TONSILLECTOMY     TOTAL ABDOMINAL HYSTERECTOMY  01/13/2004   Endometriosis with residual right ovary. Multiple GYN surgeries for chronic pelvic pain; had LSO in past   TUBAL LIGATION     WOUND EXPLORATION Right 10/16/2016   Procedure: RIGHT EXPLORATION OF LACERATION OF INDEX FINGER;  Surgeon: Franky Curia, MD;  Location: [facility];  Service: Orthopedics;  Laterality: Right;    FAMHx:  Family History  Problem Relation Age of Onset   Breast cancer Mother    Ovarian cancer Mother    Hypertension Mother    Stroke Father    Neuropathy Father    Diabetes Father    Heart disease Father        heart attack   Irritable bowel syndrome Father    Kidney disease Father     Heart attack Father        Diabetic enduced heart attack   Hyperlipidemia Father    Hypertension Father    Stomach cancer Maternal Grandmother    Colon cancer Maternal Grandmother    Diabetes Maternal Grandmother    Bipolar disorder Daughter    Heart disease Other        both grandmothers   Heart attack Maternal Uncle        Coronary artery disease   Hyperlipidemia Maternal Uncle    Hypertension Maternal Uncle    Esophageal cancer Neg Hx    Liver disease Neg Hx     SOCHx:   reports that she quit smoking about 17 years ago. Her smoking use included cigarettes. She started smoking about 43 years ago. She has a 26 pack-year smoking history.  She has been exposed to tobacco smoke. She has never used smokeless tobacco. She reports that she does not currently use alcohol. She reports that she does not use drugs.  ALLERGIES:  Allergies  Allergen Reactions   Onion Anaphylaxis   Amitriptyline  Anxiety, Palpitations and Other (See Comments)    hallucinations   Amoxicillin -Pot Clavulanate Nausea And Vomiting    NV with high dose or for a long time. Patient tolerates low dose for short amount of time.    Bacitracin Hives and Other (See Comments)    Redness, pain at site of application   Cefuroxime Nausea And Vomiting   Cephalexin  Nausea And Vomiting   Ciprofloxacin  Other (See Comments)     Fevers   Clarithromycin Nausea And Vomiting and Other (See Comments)    Pt is OK with azithromycin   gi upset   Corticosteroids Other (See Comments)    Drastic change in mental status    Doxycycline  Hives and Nausea And Vomiting   Isosorbide  Mononitrate [Isosorbide  Nitrate] Other (See Comments)    Severe migraine from this- pt refuses to take it anymore   Ondansetron  Other (See Comments)    Causes severe migraine   Propranolol  Other (See Comments)    Fatigue and makes her feel crazy, refuses to take.   Sulfa Antibiotics Other (See Comments)    Rash, vaginal blisters   Sulfonamide Derivatives  Other (See Comments)    vaginal blisters   Benadryl  [Diphenhydramine  Hcl (Sleep)] Anxiety    Extreme agitation    ROS: Pertinent items noted in HPI and remainder of comprehensive ROS otherwise negative.  HOME MEDS: Current Outpatient Medications on File Prior to Visit  Medication Sig Dispense Refill   acetaminophen  (TYLENOL ) 500 MG tablet Take 1,000 mg by mouth every 6 (six) hours as needed for mild pain (pain score 1-3) or moderate pain (pain score 4-6).     Ascorbic Acid (VITAMIN C PO) Take 1 tablet by mouth in the morning.     aspirin  EC 81 MG tablet Take 81 mg by mouth daily. Swallow whole.     busPIRone  (BUSPAR ) 10 MG tablet Take 2 tablets (20 mg total) by mouth 3 (three) times daily. 540 tablet 2   clonazePAM  (KLONOPIN ) 0.5 MG tablet Take 1 tablet (0.5 mg total) by mouth 2 (two) times daily as needed FOR ANXIETY. 60 tablet 4   diltiazem (CARDIZEM CD) 120 MG 24 hr capsule Take 1 capsule (120 mg total) by mouth daily. 30 capsule 3   Docusate Calcium  (STOOL SOFTENER PO) Take 1-2 tablets by mouth daily.     fluticasone  (FLONASE ) 50 MCG/ACT nasal spray SPRAY 1 SPRAY INTO EACH NOSTRIL EVERY DAY (Patient taking differently: Place 1 spray into both nostrils daily.) 16 mL 2   levothyroxine  (SYNTHROID ) 25 MCG tablet TAKE 1 TABLET BY MOUTH EVERY DAY 90 tablet 3   metoprolol  succinate (TOPROL -XL) 25 MG 24 hr tablet Take 0.5 tablets (12.5 mg total) by mouth daily. 15 tablet 0   nitroGLYCERIN  (NITROSTAT ) 0.4 MG SL tablet Place 1 tablet (0.4 mg total) under the tongue every 5 (five) minutes as needed for chest pain. 25 tablet 2   pantoprazole  (PROTONIX ) 40 MG tablet Take 1 tablet (40 mg total) by mouth daily. (Patient taking differently: Take 40 mg by mouth 2 (two) times daily before a meal.) 90 tablet 3   prasugrel  (EFFIENT ) 10 MG TABS tablet Take 1 tablet (10 mg total) by mouth daily. 30 tablet 2   promethazine  (PHENERGAN ) 12.5 MG tablet Take 1  tablet (12.5 mg total) by mouth every 6 (six) hours as  needed for nausea or vomiting. 30 tablet 0   ranolazine  (RANEXA ) 500 MG 12 hr tablet Take 1 tablet (500 mg total) by mouth 2 (two) times daily. 60 tablet 3   traMADol  (ULTRAM ) 50 MG tablet Take 1 tablet (50 mg total) by mouth every 6 (six) hours as needed for severe pain (pain score 7-10). 40 tablet 0   No current facility-administered medications on file prior to visit.    LABS/IMAGING: No results found. However, due to the size of the patient record, not all encounters were searched. Please check Results Review for a complete set of results. No results found.  LIPID PANEL:    Component Value Date/Time   CHOL 185 04/22/2024 0331   TRIG 122 04/22/2024 0331   HDL 52 04/22/2024 0331   CHOLHDL 3.6 04/22/2024 0331   VLDL 24 04/22/2024 0331   LDLCALC 109 (H) 04/22/2024 0331    Lipoprotein (a)  Date/Time Value Ref Range Status  04/21/2024 04:52 AM 83.2 (H) <75.0 nmol/L Final    Comment:    (NOTE) This test was developed and its performance characteristics determined by [facility]. It has not been cleared or approved by the Food and Drug Administration. Note:  Values greater than or equal to 75.0 nmol/L may       indicate an independent risk factor for CHD,       but must be evaluated with caution when applied       to non-Caucasian populations due to the       influence of genetic factors on Lp(a) across       ethnicities. Performed At: [facility] [address] [place], [place] [id number] Jennette Shorter MD Ey:[phone]      WEIGHTS: Wt Readings from Last 3 Encounters:  08/05/24 169 lb 12.8 oz (77 kg)  08/04/24 167 lb (75.8 kg)  07/10/24 171 lb 3.2 oz (77.7 kg)    VITALS: BP 130/80 (BP Location: Left Arm, Patient Position: Sitting, Cuff Size: Large)   Pulse 73   Ht 5' (1.524 m)   Wt 169 lb 12.8 oz (77 kg)   LMP 12/26/2004   SpO2 97%   BMI 33.16 kg/m   EXAM: Deferred  EKG: Deferred  ASSESSMENT: Dyslipidemia, goal LDL less than 55 Coronary artery  disease with prior PCI Ongoing anginal symptoms Statin intolerant-myalgias  PLAN: 1.   Ms. Dymond is a dyslipidemia Target LDL less than 55.  Recently she has had 30 pound weight loss and significant dietary changes.  Although this may have improved her lipids it is unlikely she will reach her target LDL with this alone.  I would however recommend repeat lipid testing today since she is fasting and will obtain an NMR and LP(a).  If she remains above target I would suggest PCSK9 inhibitor therapy.  She did have some reluctance because of a history of multiple medication intolerances, however she was agreeable to trying an antibiotic PCSK9 inhibitor.  We did discuss possible side effects, mechanism of action and risks and benefits of the medication as well as administration which was translated to her.  Based on the labs we will reach out for prior authorization for Repatha .  Plan that would be repeat lipids if well-tolerated in about 3 months.  Thanks again for the kind referral.  Vinie KYM Maxcy, MD, [facility], FNLA, FACP  [place]  [facility]  Medical Director of the [facility]  [facility]iovascular  [facility] Reduction [facility] Diplomate of the American Board of Clinical Lipidology Attending Cardiologist  Direct Dial: [phone]  Fax: [phone]  Website:  [email or web address] Vinie BROCKS Winta Barcelo 08/05/2024, 1:47 PM

## 2024-08-05 NOTE — Patient Instructions (Signed)
 Medication Instructions:   Your physician recommends that you continue on your current medications as directed. Please refer to the Current Medication list given to you today.  *If you need a refill on your cardiac medications before your next appointment, please call your pharmacy*  Lab Work: Your physician recommends that you return for lab work in: TODAY   NMR Lipoprofile and Lp(a)  If you have labs (blood work) drawn today and your tests are completely normal, you will receive your results only by: MyChart Message (if you have MyChart) OR A paper copy in the mail If you have any lab test that is abnormal or we need to change your treatment, we will call you to review the results.   Follow-Up:  TO BE DETERMINED WITH DR. HILTY BASED ON YOUR LAB RESULTS

## 2024-08-06 ENCOUNTER — Encounter: Payer: Self-pay | Admitting: Internal Medicine

## 2024-08-06 ENCOUNTER — Encounter (HOSPITAL_COMMUNITY): Payer: Self-pay

## 2024-08-06 DIAGNOSIS — G43719 Chronic migraine without aura, intractable, without status migrainosus: Secondary | ICD-10-CM

## 2024-08-06 LAB — NMR, LIPOPROFILE
Cholesterol, Total: 209 mg/dL — ABNORMAL HIGH (ref 100–199)
HDL Particle Number: 29.7 umol/L — ABNORMAL LOW (ref >=30.5)
HDL-C: 49 mg/dL (ref >39)
LDL Particle Number: 1405 nmol/L — ABNORMAL HIGH (ref <1000)
LDL Size: 21 nm (ref >20.5)
LDL-C (NIH Calc): 142 mg/dL — ABNORMAL HIGH (ref 0–99)
LP-IR Score: 46 — ABNORMAL HIGH (ref <=45)
Small LDL Particle Number: 708 nmol/L — ABNORMAL HIGH (ref <=527)
Triglycerides: 101 mg/dL (ref 0–149)

## 2024-08-06 LAB — LIPOPROTEIN A (LPA): Lipoprotein (a): 86.6 nmol/L — ABNORMAL HIGH (ref <75.0)

## 2024-08-06 MED ORDER — TRAMADOL HCL 50 MG PO TABS: 50.0000 mg | ORAL_TABLET | Freq: Four times a day (QID) | ORAL | 0 refills | Status: DC | PRN

## 2024-08-06 NOTE — Patient Instructions (Signed)
 SABRA

## 2024-08-07 ENCOUNTER — Ambulatory Visit: Payer: Self-pay | Admitting: Internal Medicine

## 2024-08-07 DIAGNOSIS — E785 Hyperlipidemia, unspecified: Secondary | ICD-10-CM

## 2024-08-07 DIAGNOSIS — Z5181 Encounter for therapeutic drug level monitoring: Secondary | ICD-10-CM

## 2024-08-10 ENCOUNTER — Telehealth (HOSPITAL_COMMUNITY): Payer: Self-pay | Admitting: *Deleted

## 2024-08-10 ENCOUNTER — Telehealth: Payer: Self-pay | Admitting: Pharmacy Technician

## 2024-08-10 ENCOUNTER — Other Ambulatory Visit (HOSPITAL_COMMUNITY): Payer: Self-pay

## 2024-08-10 MED ORDER — REPATHA SURECLICK 140 MG/ML ~~LOC~~ SOAJ: 140.0000 mg | SUBCUTANEOUS | 3 refills | Status: DC

## 2024-08-10 NOTE — Telephone Encounter (Signed)
 Prescription sent and lab slips placed in outgoing mail.  Pt notified.

## 2024-08-10 NOTE — Telephone Encounter (Signed)
 Pharmacy Patient Advocate Encounter  Received notification from [employer] that Prior Authorization for Repatha  has been APPROVED from 08/10/24 to 08/10/25. Ran test claim, Copay is $0.00- 3 months. This test claim was processed through [facility]- copay amounts may vary at other pharmacies due to pharmacy/plan contracts, or as the patient moves through the different stages of their insurance plan.   PA #/Case ID/Reference #: [id number]

## 2024-08-10 NOTE — Telephone Encounter (Signed)
 Received a call from the patient about her upcoming cardiac imaging study; pt (via her own sign language interpreter) verbalizes understanding of appt date/time, parking situation and where to check in, pre-test NPO status; name and call back number provided for further questions should they arise  Chantal Requena RN Navigator Cardiac Imaging [facility] [phone] office [phone] cell  Patient is aware to avoid caffeine  prior to her cardiac PET study.

## 2024-08-10 NOTE — Telephone Encounter (Signed)
 Pharmacy Patient Advocate Encounter   Received notification from Fax that prior authorization for repatha  is required/requested.   Insurance verification completed.   The patient is insured through [facility] [employer].   Per test claim: PA required; PA submitted to above mentioned insurance via Latent Key/confirmation #/[id number] [facility] Status is pending

## 2024-08-11 ENCOUNTER — Other Ambulatory Visit (HOSPITAL_COMMUNITY): Payer: Self-pay | Admitting: Cardiovascular Disease

## 2024-08-11 ENCOUNTER — Ambulatory Visit (HOSPITAL_BASED_OUTPATIENT_CLINIC_OR_DEPARTMENT_OTHER)
Admission: RE | Admit: 2024-08-11 | Discharge: 2024-08-11 | Disposition: A | Discharge status: Home / Self Care | Source: Ambulatory Visit | Attending: Cardiovascular Disease | Admitting: Cardiovascular Disease

## 2024-08-11 ENCOUNTER — Emergency Department (HOSPITAL_COMMUNITY)
Admission: EM | Admit: 2024-08-11 | Discharge: 2024-08-11 | Disposition: A | Discharge status: Home / Self Care | Source: Ambulatory Visit | Attending: Emergency Medicine | Admitting: Emergency Medicine

## 2024-08-11 ENCOUNTER — Emergency Department (HOSPITAL_COMMUNITY)

## 2024-08-11 ENCOUNTER — Other Ambulatory Visit: Payer: Self-pay

## 2024-08-11 DIAGNOSIS — E039 Hypothyroidism, unspecified: Secondary | ICD-10-CM | POA: Diagnosis not present

## 2024-08-11 DIAGNOSIS — Z87891 Personal history of nicotine dependence: Secondary | ICD-10-CM | POA: Diagnosis not present

## 2024-08-11 DIAGNOSIS — I2510 Atherosclerotic heart disease of native coronary artery without angina pectoris: Secondary | ICD-10-CM | POA: Diagnosis not present

## 2024-08-11 DIAGNOSIS — E785 Hyperlipidemia, unspecified: Secondary | ICD-10-CM

## 2024-08-11 DIAGNOSIS — I10 Essential (primary) hypertension: Secondary | ICD-10-CM | POA: Diagnosis not present

## 2024-08-11 DIAGNOSIS — Z955 Presence of coronary angioplasty implant and graft: Secondary | ICD-10-CM | POA: Insufficient documentation

## 2024-08-11 DIAGNOSIS — Z0389 Encounter for observation for other suspected diseases and conditions ruled out: Secondary | ICD-10-CM

## 2024-08-11 DIAGNOSIS — R079 Chest pain, unspecified: Secondary | ICD-10-CM | POA: Insufficient documentation

## 2024-08-11 DIAGNOSIS — I25119 Atherosclerotic heart disease of native coronary artery with unspecified angina pectoris: Secondary | ICD-10-CM | POA: Diagnosis present

## 2024-08-11 DIAGNOSIS — Z7982 Long term (current) use of aspirin: Secondary | ICD-10-CM | POA: Insufficient documentation

## 2024-08-11 DIAGNOSIS — Z79899 Other long term (current) drug therapy: Secondary | ICD-10-CM | POA: Diagnosis not present

## 2024-08-11 DIAGNOSIS — Z96651 Presence of right artificial knee joint: Secondary | ICD-10-CM | POA: Insufficient documentation

## 2024-08-11 LAB — CBC WITH DIFFERENTIAL/PLATELET
Abs Immature Granulocytes: 0.01 K/uL (ref 0.00–0.07)
Basophils Absolute: 0 K/uL (ref 0.0–0.1)
Basophils Relative: 1 %
Eosinophils Absolute: 0.1 K/uL (ref 0.0–0.5)
Eosinophils Relative: 2 %
HCT: 37.4 % (ref 36.0–46.0)
Hemoglobin: 12.9 g/dL (ref 12.0–15.0)
Immature Granulocytes: 0 %
Lymphocytes Relative: 36 %
Lymphs Abs: 2.1 K/uL (ref 0.7–4.0)
MCH: 30.8 pg (ref 26.0–34.0)
MCHC: 34.5 g/dL (ref 30.0–36.0)
MCV: 89.3 fL (ref 80.0–100.0)
Monocytes Absolute: 0.5 K/uL (ref 0.1–1.0)
Monocytes Relative: 8 %
Neutro Abs: 3.1 K/uL (ref 1.7–7.7)
Neutrophils Relative %: 53 %
Platelets: 266 K/uL (ref 150–400)
RBC: 4.19 MIL/uL (ref 3.87–5.11)
RDW: 12 % (ref 11.5–15.5)
WBC: 5.8 K/uL (ref 4.0–10.5)
nRBC: 0 % (ref 0.0–0.2)

## 2024-08-11 LAB — COMPREHENSIVE METABOLIC PANEL WITH GFR
ALT: 12 U/L (ref 0–44)
AST: 22 U/L (ref 15–41)
Albumin: 4.3 g/dL (ref 3.5–5.0)
Alkaline Phosphatase: 52 U/L (ref 38–126)
Anion gap: 12 (ref 5–15)
BUN: 6 mg/dL (ref 6–20)
CO2: 22 mmol/L (ref 22–32)
Calcium: 9.7 mg/dL (ref 8.9–10.3)
Chloride: 101 mmol/L (ref 98–111)
Creatinine, Ser: 0.77 mg/dL (ref 0.44–1.00)
GFR, Estimated: >60 mL/min (ref >60)
Glucose, Bld: 85 mg/dL (ref 70–99)
Potassium: 3.7 mmol/L (ref 3.5–5.1)
Sodium: 135 mmol/L (ref 135–145)
Total Bilirubin: 0.3 mg/dL (ref 0.0–1.2)
Total Protein: 7.1 g/dL (ref 6.5–8.1)

## 2024-08-11 LAB — TROPONIN T, HIGH SENSITIVITY: Troponin T High Sensitivity: <15 ng/L (ref 0–19)

## 2024-08-11 LAB — D-DIMER, QUANTITATIVE: D-Dimer, Quant: <0.27 ug{FEU}/mL (ref 0.00–0.50)

## 2024-08-11 MED ORDER — RUBIDIUM RB82 GENERATOR (RUBYFILL)
19.7800 | PACK | Freq: Once | INTRAVENOUS | Status: AC
  Administered 2024-08-11: 19.78 via INTRAVENOUS

## 2024-08-11 MED ORDER — REGADENOSON 0.4 MG/5ML IV SOLN
INTRAVENOUS | Status: AC
  Filled 2024-08-11: qty 5

## 2024-08-11 MED ORDER — RANOLAZINE ER 500 MG PO TB12: 1000.0000 mg | ORAL_TABLET | Freq: Two times a day (BID) | ORAL | 3 refills | Status: DC

## 2024-08-11 MED ORDER — RUBIDIUM RB82 GENERATOR (RUBYFILL)
19.5000 | PACK | Freq: Once | INTRAVENOUS | Status: AC
  Administered 2024-08-11: 19.5 via INTRAVENOUS

## 2024-08-11 MED ORDER — METOPROLOL SUCCINATE ER 25 MG PO TB24: 25.0000 mg | ORAL_TABLET | Freq: Every day | ORAL | 0 refills | Status: AC

## 2024-08-11 MED ORDER — REGADENOSON 0.4 MG/5ML IV SOLN
0.4000 mg | Freq: Once | INTRAVENOUS | Status: AC
  Administered 2024-08-11: 0.4 mg via INTRAVENOUS

## 2024-08-11 MED ORDER — FENTANYL CITRATE (PF) 50 MCG/ML IJ SOSY
50.0000 ug | PREFILLED_SYRINGE | Freq: Once | INTRAMUSCULAR | Status: AC
  Administered 2024-08-11: 50 ug via INTRAVENOUS
  Filled 2024-08-11: qty 1

## 2024-08-11 NOTE — ED Provider Notes (Signed)
 [place] EMERGENCY DEPARTMENT AT [facility] Provider Note   CSN: [id number] Arrival date & time: 08/11/24  1140     Patient presents with: Chest Pain   Donna Davis is a 53 y.o. female.   Patient here chest pain after nuclear medicine stress test today.  She is given a dose of nitroglycerin .  She has been having somewhat chronic chest pain on and off for the last several months.  She had stents placed back in August as well.  She still in discomfort now.  She denies any shortness of breath fever chills.  She is having chest pain prior to the nuclear stress test.  Patient is hard of hearing/deaf and uses sign language.  Using sign language interpreter.  She does have history of anxiety IBS migraines.  Does not sound like any recent surgery or travel otherwise.  No blood clot history.  The history is provided by the patient and a caregiver. A language interpreter was used.       Prior to Admission medications   Medication Sig Start Date End Date Taking? Authorizing Provider  acetaminophen  (TYLENOL ) 500 MG tablet Take 1,000 mg by mouth every 6 (six) hours as needed for mild pain (pain score 1-3) or moderate pain (pain score 4-6).    [provider]  Ascorbic Acid (VITAMIN C PO) Take 1 tablet by mouth in the morning.    [provider]  aspirin  EC 81 MG tablet Take 81 mg by mouth daily. Swallow whole.    [provider]  busPIRone  (BUSPAR ) 10 MG tablet Take 2 tablets (20 mg total) by mouth 3 (three) times daily. 02/24/24     clonazePAM  (KLONOPIN ) 0.5 MG tablet Take 1 tablet (0.5 mg total) by mouth 2 (two) times daily as needed FOR ANXIETY. 03/04/24   Joane Artist RAMAN, MD  diltiazem  (CARDIZEM  CD) 120 MG 24 hr capsule Take 1 capsule (120 mg total) by mouth daily. 06/30/24 09/28/24  Goodrich, Callie E, PA-C  Docusate Calcium  (STOOL SOFTENER PO) Take 1-2 tablets by mouth daily.    [provider]  Evolocumab  (REPATHA  SURECLICK) 140 MG/ML SOAJ Inject  140 mg into the skin every 14 (fourteen) days. 08/10/24   Hilty, Vinie BROCKS, MD  fluticasone  (FLONASE ) 50 MCG/ACT nasal spray SPRAY 1 SPRAY INTO EACH NOSTRIL EVERY DAY Patient taking differently: Place 1 spray into both nostrils daily. 04/16/24   [name], MD  levothyroxine  (SYNTHROID ) 25 MCG tablet TAKE 1 TABLET BY MOUTH EVERY DAY 08/26/23   [name], MD  metoprolol  succinate (TOPROL -XL) 25 MG 24 hr tablet Take 1 tablet (25 mg total) by mouth daily. 08/11/24   Jestin Burbach, DO  nitroGLYCERIN  (NITROSTAT ) 0.4 MG SL tablet Place 1 tablet (0.4 mg total) under the tongue every 5 (five) minutes as needed for chest pain. 04/23/24   Zhao, Xika, NP  pantoprazole  (PROTONIX ) 40 MG tablet Take 1 tablet (40 mg total) by mouth daily. Patient taking differently: Take 40 mg by mouth 2 (two) times daily before a meal. 02/24/24     prasugrel  (EFFIENT ) 10 MG TABS tablet Take 1 tablet (10 mg total) by mouth daily. 04/23/24     promethazine  (PHENERGAN ) 12.5 MG tablet Take 1 tablet (12.5 mg total) by mouth every 6 (six) hours as needed for nausea or vomiting. 07/02/24   Rosan Dayton BROCKS, DO  ranolazine  (RANEXA ) 500 MG 12 hr tablet Take 2 tablets (1,000 mg total) by mouth 2 (two) times daily. 08/11/24   Ruthe Cornet,  DO  traMADol  (ULTRAM ) 50 MG tablet Take 1 tablet (50 mg total) by mouth every 6 (six) hours as needed for severe pain (pain score 7-10). 08/06/24   [name], MD    Allergies: Onion, Amitriptyline , Amoxicillin -pot clavulanate, Bacitracin, Cefuroxime, Cephalexin , Ciprofloxacin , Clarithromycin, Corticosteroids, Doxycycline , Isosorbide  mononitrate [isosorbide  nitrate], Ondansetron , Propranolol , Sulfa antibiotics, Sulfonamide derivatives, and Benadryl  [diphenhydramine  hcl (sleep)]    Review of Systems  Updated Vital Signs BP 133/87   Pulse 84   Temp 98.6 F (37 C) (Oral)   Resp 17   LMP 12/26/2004   SpO2 99%   Physical Exam Vitals and nursing note reviewed.  Constitutional:      General: She is not  in acute distress.    Appearance: She is well-developed. She is not ill-appearing.  HENT:     Head: Normocephalic and atraumatic.  Eyes:     Extraocular Movements: Extraocular movements intact.     Conjunctiva/sclera: Conjunctivae normal.     Pupils: Pupils are equal, round, and reactive to light.  Cardiovascular:     Rate and Rhythm: Normal rate and regular rhythm.     Pulses:          Radial pulses are 2+ on the right side and 2+ on the left side.     Heart sounds: Normal heart sounds. No murmur heard. Pulmonary:     Effort: Pulmonary effort is normal. No respiratory distress.     Breath sounds: Normal breath sounds. No decreased breath sounds, wheezing, rhonchi or rales.  Abdominal:     Palpations: Abdomen is soft.     Tenderness: There is no abdominal tenderness.  Musculoskeletal:        General: No swelling.     Cervical back: Normal range of motion and neck supple.  Skin:    General: Skin is warm and dry.     Capillary Refill: Capillary refill takes less than 2 seconds.  Neurological:     General: No focal deficit present.     Mental Status: She is alert and oriented to person, place, and time.     Cranial Nerves: No cranial nerve deficit.     Motor: No weakness.  Psychiatric:        Mood and Affect: Mood normal.     (all labs ordered are listed, but only abnormal results are displayed) Labs Reviewed  CBC WITH DIFFERENTIAL/PLATELET  COMPREHENSIVE METABOLIC PANEL WITH GFR  D-DIMER, QUANTITATIVE  TROPONIN T, HIGH SENSITIVITY    EKG: EKG Interpretation Date/Time:  Tuesday August 11 2024 11:57:08 EST Ventricular Rate:  89 PR Interval:  179 QRS Duration:  95 QT Interval:  402 QTC Calculation: 490 R Axis:   3  Text Interpretation: Sinus rhythm Confirmed by Ruthe Cornet [phone]) on 08/11/2024 12:23:43 PM  Radiology: NM PET CT CARDIAC PERFUSION MULTI W/ABSOLUTE BLOODFLOW Result Date: 08/11/2024   LV perfusion is normal. There is no evidence of ischemia.  There is no evidence of infarction.   Rest left ventricular function is normal. Rest EF: 67%. Stress left ventricular function is normal. Stress EF: 70%. End diastolic cavity size is normal. End systolic cavity size is normal.   Myocardial blood flow was computed to be 1.6ml/g/min at rest and 2.46ml/g/min at stress. Global myocardial blood flow reserve was 2.22 and was normal.   Coronary calcium  assessment not performed due to prior revascularization. Due to prior stents   The study is normal. The study is low risk.   Patient sent to ER for SSCP despite no acute  ECG changes and normal perfusion.     Procedures   Medications Ordered in the ED  fentaNYL  (SUBLIMAZE ) injection 50 mcg (50 mcg Intravenous Given 08/11/24 1324)                                    Medical Decision Making Amount and/or Complexity of Data Reviewed Labs: ordered. Radiology: ordered.  Risk Prescription drug management.   Lavette P Wanek is here with chest pain.  Normal vitals.  No fever.  She has somewhat chronic angina/CAD status post multiple stents last placed in August.  She had a nuclear medicine stress test today given that she has had some ongoing chest pain the last several weeks.  She was having chest pain she states prior to going into her stress test today that got worse during the stress test was sent here for evaluation.  Per my review of the stress test there does not appear to be any acute findings and overall looks to be a normal stress test will talk to cardiology team to come and evaluate and further confirm this but I will get troponin EKG D-dimer chest x-ray and look for other etiologies of chest pain.  This could be chronic chest pain process.  This could be stress related process as well.  Nitroglycerin  has have not helped much.  Her blood pressure is 133/87.  Is not tachycardic.  EKG here shows sinus rhythm with no ischemic changes.  Ultimately will give her a dose of IV fentanyl  for further pain  control.  Overall looks like she had a reassuring stress test.  Will get labs await cardiology consultation.  Overall Dr. Sheena cardiology evaluated the patient.  Workup was unremarkable.  D-dimer normal.  Troponin normal.  No evidence of pneumonia pneumothorax.  No significant anemia electrolyte abnormality leukocytosis.  Overall suspect more chronic anginal process.  Stress test is reassuring.  Will increase her Ranexa  to 1000 mg twice daily and Toprol  25 mg daily.  Patient stable for discharge.  Will follow-up with cardiology.  Understands return precautions.  This chart was dictated using voice recognition software.  Despite best efforts to proofread,  errors can occur which can change the documentation meaning.      Final diagnoses:  Nonspecific chest pain    ED Discharge Orders          Ordered    ranolazine  (RANEXA ) 500 MG 12 hr tablet  2 times daily        08/11/24 1321    metoprolol  succinate (TOPROL -XL) 25 MG 24 hr tablet  Daily        08/11/24 1321               [place], DO 08/11/24 1337

## 2024-08-11 NOTE — Progress Notes (Signed)
 Pt. Reported having 20/10 chest pain after given regadenoson  for her CT cardiac PET scan. RN Terran Klinke suggested drinking diet coke for the caffeine  reversal. Pt. Was unable to tolerate. Pt. EKG was normal reading nsr/occassional ST most likely due to anxiety. Pt. Took her nitroglycerin  with no relief. Rn Percy Winterrowd contacted [employer] Dr. Delford after reading her images. He said they look wnl but with her pain being 20/10 she needed to go the emergency department. RN Harolyn Cocker took her to the ED for further workup.

## 2024-08-11 NOTE — Discharge Instructions (Addendum)
 Per cardiology recommendations increase metoprolol  25 mg daily.  Increase Ranexa  1000 mg twice a day.  Return if symptoms worsen.  Follow-up with cardiology team.

## 2024-08-11 NOTE — ED Triage Notes (Signed)
 Pt arrived from [facility] med due to chest pain after stress test. Per nuc med NSR shown on EKG.  1 dose of nitroglycerin  given Antidote caffeine  given after stress test with no relief.  Pt has had chest pain since June of this year on and off. 2 stents placed of August of this year.   Pt is c/o HA prior to nitroglycerin  given increasing after. Blurred vision starting after tetsing

## 2024-08-11 NOTE — Consult Note (Signed)
 Cardiology Consultation   Patient ID: Donna Davis MRN: [id number]; DOB: November 26, 1970  Admit date: 08/11/2024 Date of Consult: 08/11/2024  PCP:  [name], MD    [facility] Providers Cardiologist:  Ozell Fell, MD        Patient Profile: Donna Davis is a 53 y.o. female with a hx of coronary artery disease multivessel disease underwent PCI of the right coronary artery and left circumflex artery in August 2025, then due to recurrent chest pain she underwent a heart catheterization in September 2025 at that time all of her stents were patent there was moderate diffuse mid to distal LAD stenosis her antianginals were optimized.  She was started on diltiazem , and her metoprolol  succinate was continued for treatment of her stable angina, hyperlipidemia, frequent PVCs, hypertension, hypothyroidism, morbid obesity who is being seen 08/11/2024 for the evaluation of  at the request of Dr Ruthe.  History of Present Illness: Donna Davis was seen in the [facility] long ED.  Her visit was facilitated by an interpreter for that hearing-impaired.  She tells me that her chest pain has been going on for a very long time.  She notes that even since her stent she has not been free of chest pain.  She describes this as a left-sided almost round from her chest to her back type sensation that comes and goes.  At times it intensifies when she is exerting herself at times is at rest.  There is no associated shortness of breath or lightheadedness or dizziness.  Today's visit was prompted the fact that after she underwent her cardiac PET she got intensified pain.  She admits that she had not taking any of her medicines this morning due to being n.p.o. for her testing.  On a scale of 1-10 she could not give me a specific quantifying number but says it is about the same as she always had it.  Again for this time no shortness of breath no lightheadedness no dizziness.  Her husband is also by the bedside.   Past  Medical History:  Diagnosis Date   Allergic rhinitis due to allergen 12/19/2016   Anxiety    Anxiety state 09/03/2006   Arrhythmia 02/2024   Arthralgia of right temporomandibular joint 08/26/2018   Bilateral deafness 01/17/2016   Cardiac murmur 03/19/2024   Chest pain of uncertain etiology 03/19/2024   Clostridium difficile infection    x several times   Coronary artery disease 03/2024   2 stents in place medication for the 3rd blockage   Deaf    uses sign language   Elevated LDL cholesterol level 04/07/2024   Family history of breast cancer in mother 08/26/2015   Family history of malignant neoplasm of gastrointestinal tract    Frequent PVCs 03/19/2024   Gallstones    GERD 07/01/2006   Qualifier: Diagnosis of   By: Charmayne MD, Arlyss         Hearing difficulty    b/l, 2/2 congenital rubella s/p stapedectomy. Using hearing aid   Hepatic cyst    6 mm on CT done done in 05/2005   Hyperglycemia 04/07/2024   Hyperlipidemia 02/2024   Hypertension 02/2024   Controlrd with medication   Hypothyroidism    IBS 07/01/2006   Qualifier: Diagnosis of   By: Charmayne MD, Arlyss         Low back pain 10/09/2022   Migraine 12/02/2012   Obesity    Obesity (BMI 35.0-39.9 without comorbidity) 02/18/2014   On hormone replacement therapy  04/07/2024   Pain in thoracic spine 10/09/2022   Palpitations 03/05/2024   PONV (postoperative nausea and vomiting)    sometimes; depends on how long I'm under   Routine adult health maintenance 09/01/2012   MMG - Normal 2014 - she is due and I reminded her to schedule.   Pelvic - hysterectomy, no cervix.  Sees OBGYN  Flu vaccine seasonally.      DEXA - at 53yo      Screening for colon cancer 10/22/2023   Screening mammogram for breast cancer 04/07/2024   Syncope and collapse 03/2024    Past Surgical History:  Procedure Laterality Date   ABDOMINAL ADHESION SURGERY     Enterolysis   CESAREAN SECTION  1994   CHOLECYSTECTOMY N/A 06/26/2013   Procedure:  LAPAROSCOPIC CHOLECYSTECTOMY WITH ATTEMPTED INTRAOPERATIVE CHOLANGIOGRAM;  Surgeon: Vicenta DELENA Poli, MD;  Location: [facility];  Service: General;  Laterality: N/A;   COCHLEAR IMPLANT  03/30/2016   COCHLEAR IMPLANT Right 03/2015   COLONOSCOPY WITH PROPOFOL   04/23/2012   CORONARY ANGIOPLASTY  04/2024   CORONARY PRESSURE/FFR STUDY N/A 06/01/2024   Procedure: CORONARY PRESSURE/FFR STUDY;  Surgeon: Mady Bruckner, MD;  Location: [facility];  Service: Cardiovascular;  Laterality: N/A;   CORONARY PRESSURE/FFR WITH 3D MAPPING N/A 04/20/2024   Procedure: Coronary Pressure/FFR w/3D Mapping;  Surgeon: Mady Bruckner, MD;  Location: [facility];  Service: Cardiovascular;  Laterality: N/A;   CORONARY STENT INTERVENTION N/A 04/22/2024   Procedure: CORONARY STENT INTERVENTION;  Surgeon: Darron Deatrice DELENA, MD;  Location: [facility];  Service: Cardiovascular;  Laterality: N/A;   CORONARY ULTRASOUND/IVUS N/A 04/22/2024   Procedure: Coronary Ultrasound/IVUS;  Surgeon: Darron Deatrice DELENA, MD;  Location: [facility];  Service: Cardiovascular;  Laterality: N/A;  RCA   DIAGNOSTIC LAPAROSCOPY  01/14/2006   I've had several (07/23/2017)   ESOPHAGOGASTRODUODENOSCOPY (EGD) WITH PROPOFOL   04/23/2012   JOINT REPLACEMENT     KNEE SURGERY Right 2014   LAPAROSCOPIC OVARIAN CYSTECTOMY Right 04/16/2000   LEFT HEART CATH AND CORONARY ANGIOGRAPHY N/A 04/20/2024   Procedure: LEFT HEART CATH AND CORONARY ANGIOGRAPHY;  Surgeon: Mady Bruckner, MD;  Location: [facility];  Service: Cardiovascular;  Laterality: N/A;   LEFT HEART CATH AND CORONARY ANGIOGRAPHY N/A 06/01/2024   Procedure: LEFT HEART CATH AND CORONARY ANGIOGRAPHY;  Surgeon: Mady Bruckner, MD;  Location: [facility];  Service: Cardiovascular;  Laterality: N/A;   SALPINGOOPHORECTOMY Left    STAPEDECTOMY Left 05/14/2006   Thyroid  Nodule removed  1997   TONSILLECTOMY     TOTAL ABDOMINAL HYSTERECTOMY  01/13/2004    Endometriosis with residual right ovary. Multiple GYN surgeries for chronic pelvic pain; had LSO in past   TUBAL LIGATION     WOUND EXPLORATION Right 10/16/2016   Procedure: RIGHT EXPLORATION OF LACERATION OF INDEX FINGER;  Surgeon: Franky Curia, MD;  Location: [facility];  Service: Orthopedics;  Laterality: Right;       Scheduled Meds:  fentaNYL  (SUBLIMAZE ) injection  50 mcg Intravenous Once   Continuous Infusions:  PRN Meds:   Allergies:    Allergies  Allergen Reactions   Onion Anaphylaxis   Amitriptyline  Anxiety, Palpitations and Other (See Comments)    hallucinations   Amoxicillin -Pot Clavulanate Nausea And Vomiting    NV with high dose or for a long time. Patient tolerates low dose for short amount of time.    Bacitracin Hives and Other (See Comments)    Redness, pain at site of  application   Cefuroxime Nausea And Vomiting   Cephalexin  Nausea And Vomiting   Ciprofloxacin  Other (See Comments)     Fevers   Clarithromycin Nausea And Vomiting and Other (See Comments)    Pt is OK with azithromycin   gi upset   Corticosteroids Other (See Comments)    Drastic change in mental status    Doxycycline  Hives and Nausea And Vomiting   Isosorbide  Mononitrate [Isosorbide  Nitrate] Other (See Comments)    Severe migraine from this- pt refuses to take it anymore   Ondansetron  Other (See Comments)    Causes severe migraine   Propranolol  Other (See Comments)    Fatigue and makes her feel crazy, refuses to take.   Sulfa Antibiotics Other (See Comments)    Rash, vaginal blisters   Sulfonamide Derivatives Other (See Comments)    vaginal blisters   Benadryl  [Diphenhydramine  Hcl (Sleep)] Anxiety    Extreme agitation    Social History:   Social History   Socioeconomic History   Marital status: Married    Spouse name: Not on file   Number of children: 2   Years of education: Not on file   Highest education level: Not on file  Occupational History   Occupation:  immunologist    Comment: works in [place]   Tobacco Use   Smoking status: Former    Current packs/day: 0.00    Average packs/day: 1 pack/day for 26.0 years (26.0 ttl pk-yrs)    Types: Cigarettes    Start date: 02/17/1981    Quit date: 02/18/2007    Years since quitting: 17.4    Passive exposure: Current (husband smokes outside now for the past 25 years)   Smokeless tobacco: Never   Tobacco comments:    Quit smoke in 02/2007  Vaping Use   Vaping status: Never Used  Substance and Sexual Activity   Alcohol use: Not Currently    Comment: Very Rarely   Drug use: No   Sexual activity: Not Currently    Partners: Male    Birth control/protection: None    Comment: Married same person 36 years  Other Topics Concern   Not on file  Social History Narrative   Current smoker, not using alcohol or drugs at this timeLives with husband and 2 children      Are you right handed or left handed? Right Handed    Are you currently employed ? 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 No    Who lives with you? Husband and 85 pound dog    What type of home do you live in: 1 story or 2 story? Lives in a one story home        Social Drivers of Health   Financial Resource Strain: Not on file  Food Insecurity: No Food Insecurity (08/05/2024)   Hunger Vital Sign    Worried About Running Out of Food in the Last Year: Never true    Ran Out of Food in the Last Year: Never true  Transportation Needs: No Transportation Needs (06/03/2024)   PRAPARE - Administrator, Civil Service (Medical): No    Lack of Transportation (Non-Medical): No  Physical Activity: Not on file  Stress: Not on file  Social Connections: Not on file  Intimate Partner Violence: Not At Risk (06/03/2024)   Humiliation, Afraid, Rape, and Kick questionnaire    Fear of Current or Ex-Partner: No    Emotionally Abused:  No    Physically Abused: No    Sexually Abused: No     Family History:    Family History  Problem Relation Age of Onset   Breast cancer Mother    Ovarian cancer Mother    Hypertension Mother    Stroke Father    Neuropathy Father    Diabetes Father    Heart disease Father        heart attack   Irritable bowel syndrome Father    Kidney disease Father    Heart attack Father        Diabetic enduced heart attack   Hyperlipidemia Father    Hypertension Father    Stomach cancer Maternal Grandmother    Colon cancer Maternal Grandmother    Diabetes Maternal Grandmother    Bipolar disorder Daughter    Heart disease Other        both grandmothers   Heart attack Maternal Uncle        Coronary artery disease   Hyperlipidemia Maternal Uncle    Hypertension Maternal Uncle    Esophageal cancer Neg Hx    Liver disease Neg Hx      ROS:  Please see the history of present illness.  Chest pain  All other ROS reviewed and negative.     Physical Exam/Data: Vitals:   08/11/24 1145 08/11/24 1146 08/11/24 1157  BP: (!) 185/147  133/87  Pulse: 89 92 87  Resp: 16 (!) 21 (!) 21  Temp:  98.6 F (37 C) 98.6 F (37 C)  TempSrc:  Oral Oral  SpO2: 100%     No intake or output data in the 24 hours ending 08/11/24 1302    08/05/2024    1:11 PM 08/04/2024    8:53 AM 07/10/2024    9:14 AM  Last 3 Weights  Weight (lbs) 169 lb 12.8 oz 167 lb 171 lb 3.2 oz  Weight (kg) 77.021 kg 75.751 kg 77.656 kg     There is no height or weight on file to calculate BMI.  General:  Well nourished, well developed, in no acute distress HEENT: normal Neck: no JVD Vascular: No carotid bruits; Distal pulses 2+ bilaterally Cardiac:  normal S1, S2; RRR; no murmur  Lungs:  clear to auscultation bilaterally, no wheezing, rhonchi or rales  Abd: soft, nontender, no hepatomegaly  Ext: no edema Musculoskeletal:  No deformities, BUE and BLE strength normal and equal Skin: warm and dry  Neuro:  CNs 2-12 intact, no focal abnormalities noted Psych:  Normal affect    EKG:  The EKG was personally reviewed and demonstrates:   Telemetry:  Telemetry was personally reviewed and demonstrates:  sinus rhythm  Relevant CV Studies: PET scan done today show no evidence of perfusion defect/no ischemia  Laboratory Data: High Sensitivity Troponin:  No results for input(s): TROPONINIHS in the last 720 hours.   ChemistryNo results for input(s): NA, K, CL, CO2, GLUCOSE, BUN, CREATININE, CALCIUM , MG, GFRNONAA, GFRAA, ANIONGAP in the last 168 hours.  No results for input(s): PROT, ALBUMIN, AST, ALT, ALKPHOS, BILITOT in the last 168 hours. Lipids No results for input(s): CHOL, TRIG, HDL, LABVLDL, LDLCALC, CHOLHDL in the last 168 hours.  HematologyNo results for input(s): WBC, RBC, HGB, HCT, MCV, MCH, MCHC, RDW, PLT in the last 168 hours. Thyroid  No results for input(s): TSH, FREET4 in the last 168 hours.  BNPNo results for input(s): BNP, PROBNP in the last 168 hours.  DDimer No results for input(s): DDIMER in the last 168 hours.  Radiology/Studies:  NM PET CT CARDIAC PERFUSION MULTI W/ABSOLUTE BLOODFLOW Result Date: 08/11/2024   LV perfusion is normal. There is no evidence of ischemia. There is no evidence of infarction.   Rest left ventricular function is normal. Rest EF: 67%. Stress left ventricular function is normal. Stress EF: 70%. End diastolic cavity size is normal. End systolic cavity size is normal.   Myocardial blood flow was computed to be 1.98ml/g/min at rest and 2.46ml/g/min at stress. Global myocardial blood flow reserve was 2.22 and was normal.   Coronary calcium  assessment not performed due to prior revascularization. Due to prior stents   The study is normal. The study is low risk.   Patient sent to ER for SSCP despite no acute ECG changes and normal perfusion.     Assessment and Plan: Coronary artery disease with chest pain-I suspect her symptoms is in the setting of ischemia  with nonobstructive coronary artery (INOCA) -in this case the best thing to do is to optimize her antianginals.  I will go up on her Ranexa  to 1000 mg twice daily, increase her beta-blocker Toprol  XL to 25 mg daily.  Hoping that this can help with optimizing her chronic anginal pain.  Ideally if needed in outpatient setting she can be optimized with addition of ACE inhibitors as well.  Unfortunately troponin is pending but I expect this to also be slightly elevated given her recent nuclear PET CT perfusion testing.  I explained this to the patient.  Again her pain resembles her chronic stable anginal pain which we will go ahead as noted above to optimize her antianginals. Blood pressure is acceptable, continue with current antihypertensive regimen. Hyperlipidemia - continue with current lipid-lowering agent she is also on PCSK9 inhibitor as an outpatient she follows with Dr. Mona in the lipid clinic. The patient understands the need to lose weight with diet and exercise. We have discussed specific strategies for this.  As I know good can be a chronic limiting coronary disease and can cause significant stress and anxiety it would really be beneficial also if she is able to with her primary care doctor to set her up with some behavioral health or social therapy.  From a cardiovascular standpoint she can be discharged to home with her optimized medication regimen if her clinical picture does not change.   Risk Assessment/Risk Scores:              For questions or updates, please contact [facility] Please consult [email or web address] for contact info under      Signed, Laneice Meneely, DO  08/11/2024 1:02 PM

## 2024-08-12 LAB — NM PET CT CARDIAC PERFUSION MULTI W/ABSOLUTE BLOODFLOW
LV dias vol: 69 mL (ref 46–106)
LV sys vol: 23 mL (ref 3.8–5.2)
MBFR: 2.22
Nuc Rest EF: 67 %
Nuc Stress EF: 70 %
Rest MBF: 1.11 ml/g/min
Rest Nuclear Isotope Dose: 19.8 mCi
ST Depression (mm): 0 mm
Stress MBF: 2.46 ml/g/min
Stress Nuclear Isotope Dose: 19.5 mCi

## 2024-08-16 ENCOUNTER — Other Ambulatory Visit: Payer: Self-pay | Admitting: Home Health

## 2024-08-16 ENCOUNTER — Ambulatory Visit: Payer: Self-pay | Admitting: Cardiovascular Disease

## 2024-08-17 ENCOUNTER — Other Ambulatory Visit: Payer: Self-pay

## 2024-08-17 DIAGNOSIS — E039 Hypothyroidism, unspecified: Secondary | ICD-10-CM

## 2024-08-17 MED ORDER — LEVOTHYROXINE SODIUM 25 MCG PO TABS: 25.0000 ug | ORAL_TABLET | Freq: Every day | ORAL | 3 refills | Status: AC

## 2024-08-17 NOTE — Telephone Encounter (Signed)
 Medication sent to pharmacy

## 2024-08-20 ENCOUNTER — Other Ambulatory Visit: Payer: Self-pay | Admitting: Home Health

## 2024-08-20 ENCOUNTER — Other Ambulatory Visit: Payer: Self-pay

## 2024-08-20 ENCOUNTER — Ambulatory Visit: Payer: Self-pay | Admitting: Cardiovascular Disease

## 2024-08-21 ENCOUNTER — Encounter: Payer: Self-pay | Admitting: Cardiovascular Disease

## 2024-08-21 MED ORDER — PRASUGREL HCL 10 MG PO TABS: 10.0000 mg | ORAL_TABLET | Freq: Every day | ORAL | 0 refills | Status: AC

## 2024-08-24 ENCOUNTER — Other Ambulatory Visit: Payer: Self-pay

## 2024-08-24 NOTE — Progress Notes (Signed)
 MyChart message containing providers result note and interpretation read by patient on: Last read by Sakira P Franqui at 9:28AM on 08/21/2024.

## 2024-08-24 NOTE — Progress Notes (Signed)
 MyChart message containing providers result note and interpretation read by patient on: Last read by Shannette P Christina at 5:05PM on 08/19/2024.

## 2024-08-25 ENCOUNTER — Encounter: Payer: Self-pay | Admitting: Cardiovascular Disease

## 2024-08-28 ENCOUNTER — Ambulatory Visit

## 2024-08-28 ENCOUNTER — Encounter: Payer: Self-pay | Admitting: Internal Medicine

## 2024-08-28 ENCOUNTER — Ambulatory Visit: Payer: Self-pay

## 2024-08-28 ENCOUNTER — Other Ambulatory Visit: Payer: Self-pay

## 2024-08-28 VITALS — BP 125/76 | HR 64 | Temp 98.0°F | Resp 24 | Ht 60.0 in | Wt 165.4 lb

## 2024-08-28 DIAGNOSIS — R110 Nausea: Secondary | ICD-10-CM

## 2024-08-28 DIAGNOSIS — Z79899 Other long term (current) drug therapy: Secondary | ICD-10-CM

## 2024-08-28 DIAGNOSIS — Z87891 Personal history of nicotine dependence: Secondary | ICD-10-CM | POA: Diagnosis not present

## 2024-08-28 DIAGNOSIS — M791 Myalgia, unspecified site: Secondary | ICD-10-CM

## 2024-08-28 DIAGNOSIS — R5383 Other fatigue: Secondary | ICD-10-CM | POA: Diagnosis not present

## 2024-08-28 LAB — POCT URINALYSIS DIPSTICK
Bilirubin, UA: NEGATIVE
Blood, UA: NEGATIVE
Glucose, UA: NEGATIVE
Ketones, UA: NEGATIVE
Nitrite, UA: NEGATIVE
Protein, UA: NEGATIVE
Spec Grav, UA: 1.02 (ref 1.010–1.025)
Urobilinogen, UA: 0.2 U/dL
pH, UA: 5.5 (ref 5.0–8.0)

## 2024-08-28 LAB — GLUCOSE, CAPILLARY: Glucose-Capillary: 85 mg/dL (ref 70–99)

## 2024-08-28 LAB — POCT GLYCOSYLATED HEMOGLOBIN (HGB A1C): Hemoglobin A1C: 5.2 % (ref 4.0–5.6)

## 2024-08-28 NOTE — Progress Notes (Signed)
 Patient name: Donna Davis Date of birth: 1971-03-07 Date of visit: 08/30/2024  Type of visit: Acute Office Visit   Subjective   Chief concern:  Chief Complaint  Patient presents with   Pain    Hurts in hands to just walk around   Medication Reaction    Rapatha-  Thirsty and drinking a lot of fluid    Donna Davis is a 53 y.o. female with a history of HTN, CAD, unstable angina, bilateral deafness, migraines, GERD, HLD, who presents to [facility] clinic for evaluation of fatigue and and muscle aches.  Video ASL interpreter [phone]) was used for interpretation of this visit.  [name] is a patient of Dr. Terrea, who presents to clinic today for acute office visit with complaints of roughly 4 weeks of fatigue, generalized bodyaches, dull headache, dry eyes, dry mouth, and feeling dehydrated overall.  She follows closely with cardiology Dr. Wonda, and discussed her symptoms with them briefly, their plan is to begin decreasing medications to see if this is a response to any medications that were increased recently.  She does note that she did receive Repatha  injection on 12/1, and all of her symptoms have worsened ever since then, though her symptoms did precede her receiving the injection.  The most bothersome symptoms that she is currently experiencing are generalized muscle aching, a dull headache which has been persistent and localized to the right side of her head, as well as dry eyes and dry mouth.  For the muscle aches and headaches, she has tried Tylenol  and tramadol  which reduces the pain to a functional level, though does not get rid of it.  She feels like her eyes and her mouth are especially very dry, reporting that she is requiring Systane eyedrops, and that her vision is only clear right after she has used the eyedrops, and then becomes a bit blurry when her eyes feel dry.  She reports that she has been drinking significant amount of water , roughly a 1.5 L water  bottle about 4-5 times per  day due to feeling like she has a very dry mouth.  She also notes that she has had nausea throughout this time, and has had 5 total episodes of vomiting.  She has not had any blood in her vomit.  She does report that she has Phenergan  which she uses as needed for migraines, and has taken the Phenergan  when she did have the vomiting episodes, but is otherwise not been taking Phenergan  daily.  She reports that she does feel feverish, though when she checks her temperature it is normal.  Home COVID testing that she took earlier this morning was negative.  Her last bowel movement was yesterday, which was without blood or melena.  She was recently seen in the ED on 08/11/2024 for nonspecific chest pain.  Cardiology Dr. Sheena evaluated her, and her cardiac workup was unremarkable.  She was increased to Ranexa  1000 mg twice daily and continued on metoprolol  25 mg daily with instructions to follow-up with cardiology.  She has a cardiology follow-up on 09/22/2024 with cardiology, though has been in discussion via MyChart with Dr. Wonda regarding her recent symptoms, and they have discussed slowly reducing the medications that she is taking.   When asked whether she has a history of any autoimmune conditions or autoimmune conditions around the family, she reports none of her own though mother has RA, and she states that her father had diabetes though is unsure which type but knows that  he was insulin-dependent.  Point-of-care A1c completed today of 5.2.  ROS: Negative unless otherwise listed in the HPI.  Patient Active Problem List   Diagnosis Date Noted   Fatigue 08/30/2024   Physical deconditioning 07/11/2024   Primary hypertension 05/29/2024   CAD (coronary artery disease) 04/23/2024   Unstable angina (HCC) 04/20/2024   Syncope and collapse 04/14/2024   Anxiety    Clostridium difficile infection    Family history of malignant neoplasm of gastrointestinal tract    Gallstones    Hepatic cyst    Obesity     PONV (postoperative nausea and vomiting)    Hyperglycemia 04/07/2024   Elevated LDL cholesterol level 04/07/2024   Screening mammogram for breast cancer 04/07/2024   Cardiac murmur 03/19/2024   Chest pain of uncertain etiology 03/19/2024   Frequent PVCs 03/19/2024   Palpitations 03/05/2024   Screening for colon cancer 10/22/2023   Low back pain 10/09/2022   Pain in thoracic spine 10/09/2022   Arthralgia of right temporomandibular joint 08/26/2018   Allergic rhinitis due to allergen 12/19/2016   Bilateral deafness 01/17/2016   Family history of breast cancer in mother 08/26/2015   Obesity (BMI 35.0-39.9 without comorbidity) 02/18/2014   Migraine 12/02/2012   Routine adult health maintenance 09/01/2012   Anxiety state 09/03/2006   Hypothyroidism 07/01/2006   GERD 07/01/2006   IBS 07/01/2006     Past Surgical History:  Procedure Laterality Date   ABDOMINAL ADHESION SURGERY     Enterolysis   CESAREAN SECTION  1994   CHOLECYSTECTOMY N/A 06/26/2013   Procedure: LAPAROSCOPIC CHOLECYSTECTOMY WITH ATTEMPTED INTRAOPERATIVE CHOLANGIOGRAM;  Surgeon: Vicenta DELENA Poli, MD;  Location: [facility];  Service: General;  Laterality: N/A;   COCHLEAR IMPLANT  03/30/2016   COCHLEAR IMPLANT Right 03/2015   COLONOSCOPY WITH PROPOFOL   04/23/2012   CORONARY ANGIOPLASTY  04/2024   CORONARY PRESSURE/FFR STUDY N/A 06/01/2024   Procedure: CORONARY PRESSURE/FFR STUDY;  Surgeon: Mady Bruckner, MD;  Location: [facility];  Service: Cardiovascular;  Laterality: N/A;   CORONARY PRESSURE/FFR WITH 3D MAPPING N/A 04/20/2024   Procedure: Coronary Pressure/FFR w/3D Mapping;  Surgeon: Mady Bruckner, MD;  Location: [facility];  Service: Cardiovascular;  Laterality: N/A;   CORONARY STENT INTERVENTION N/A 04/22/2024   Procedure: CORONARY STENT INTERVENTION;  Surgeon: Darron Deatrice DELENA, MD;  Location: [facility];  Service: Cardiovascular;  Laterality: N/A;   CORONARY ULTRASOUND/IVUS N/A  04/22/2024   Procedure: Coronary Ultrasound/IVUS;  Surgeon: Darron Deatrice DELENA, MD;  Location: [facility];  Service: Cardiovascular;  Laterality: N/A;  RCA   DIAGNOSTIC LAPAROSCOPY  01/14/2006   I've had several (07/23/2017)   ESOPHAGOGASTRODUODENOSCOPY (EGD) WITH PROPOFOL   04/23/2012   JOINT REPLACEMENT     KNEE SURGERY Right 2014   LAPAROSCOPIC OVARIAN CYSTECTOMY Right 04/16/2000   LEFT HEART CATH AND CORONARY ANGIOGRAPHY N/A 04/20/2024   Procedure: LEFT HEART CATH AND CORONARY ANGIOGRAPHY;  Surgeon: Mady Bruckner, MD;  Location: [facility];  Service: Cardiovascular;  Laterality: N/A;   LEFT HEART CATH AND CORONARY ANGIOGRAPHY N/A 06/01/2024   Procedure: LEFT HEART CATH AND CORONARY ANGIOGRAPHY;  Surgeon: Mady Bruckner, MD;  Location: [facility];  Service: Cardiovascular;  Laterality: N/A;   SALPINGOOPHORECTOMY Left    STAPEDECTOMY Left 05/14/2006   Thyroid  Nodule removed  1997   TONSILLECTOMY     TOTAL ABDOMINAL HYSTERECTOMY  01/13/2004   Endometriosis with residual right ovary. Multiple GYN surgeries for chronic pelvic pain; had LSO in past  TUBAL LIGATION     WOUND EXPLORATION Right 10/16/2016   Procedure: RIGHT EXPLORATION OF LACERATION OF INDEX FINGER;  Surgeon: Franky Curia, MD;  Location: [facility];  Service: Orthopedics;  Laterality: Right;     Current Outpatient Medications  Medication Instructions   acetaminophen  (TYLENOL ) 1,000 mg, Every 6 hours PRN   Ascorbic Acid (VITAMIN C PO) 1 tablet, Every morning   aspirin  EC 81 mg, Daily   busPIRone  (BUSPAR ) 20 mg, Oral, 3 times daily   clonazePAM  (KLONOPIN ) 0.5 MG tablet Take 1 tablet (0.5 mg total) by mouth 2 (two) times daily as needed FOR ANXIETY.   diltiazem  (CARDIZEM  CD) 120 mg, Oral, Daily   Docusate Calcium  (STOOL SOFTENER PO) 1-2 tablets, Daily   fluticasone  (FLONASE ) 50 MCG/ACT nasal spray SPRAY 1 SPRAY INTO EACH NOSTRIL EVERY DAY   levothyroxine  (SYNTHROID ) 25 mcg, Oral,  Daily   metoprolol  succinate (TOPROL -XL) 50 MG 24 hr tablet TAKE 1 TABLET EVERY DAY WITH A MEAL   metoprolol  succinate (TOPROL -XL) 25 mg, Oral, Daily   nitroGLYCERIN  (NITROSTAT ) 0.4 mg, Sublingual, Every 5 min PRN   pantoprazole  (PROTONIX ) 40 mg, Oral, Daily   prasugrel  (EFFIENT ) 10 mg, Oral, Daily   promethazine  (PHENERGAN ) 12.5 mg, Oral, Every 6 hours PRN   ranolazine  (RANEXA ) 1,000 mg, Oral, 2 times daily   Repatha  SureClick 140 mg, Subcutaneous, Every 14 days   traMADol  (ULTRAM ) 50 mg, Oral, Every 6 hours PRN    Social History[1]    Objective  Today's Vitals   08/28/24 1101 08/28/24 1104  BP: 125/76   Pulse: 64   Resp: (!) 24   Temp: 98 F (36.7 C)   TempSrc: Oral   SpO2: 100%   Weight: 165 lb 6.4 oz (75 kg)   Height: 5' (1.524 m)   PainSc: 8  8   PainLoc: Generalized   Body mass index is 32.3 kg/m.   Physical Exam:   Constitutional: tired-appearing female sitting in exam chair, in no acute distress. Ambulates without use of assistance device  HEENT: normocephalic atraumatic, mucous membranes moist Eyes: conjunctiva non-erythematous Neck: supple Cardiovascular: regular rate and rhythm, bilateral radial pulses 2+ Pulmonary/Chest: normal work of breathing on room air, lungs clear to auscultation bilaterally Abdominal: soft, non-tender, non-distended; healing bruise at site of Repatha  injection  MSK: normal bulk and tone. Neurological: alert & oriented x 3 Skin: warm and dry Psych: mood calm, behavior normal, thought content normal, judgement normal    Last hemoglobin A1c Lab Results  Component Value Date   HGBA1C 5.2 08/28/2024      The 10-year ASCVD risk score (Arnett DK, et al., 2019) is: 2.8%   Values used to calculate the score:     Age: 61 years     Clinically relevant sex: Female     Is Non-Hispanic African American: No     Diabetic: No     Tobacco smoker: No     Systolic Blood Pressure: 125 mmHg     Is BP treated: Yes     HDL Cholesterol: 52  mg/dL     Total Cholesterol: 209 mg/dL      Assessment & Plan  Problem List Items Addressed This Visit     Fatigue - Primary   Patient presents to clinic for acute visit regarding roughly 4 weeks of fatigue, muscle aching, right-sided dull headache, dry eyes, dry mouth, and feeling overall dehydrated.  Point-of-care A1c completed today in office of 5.2.  She has had episodes of nausea, with  5 episodes of vomiting without blood.  She states that during this time she has felt feverish at times, though when she checks her temperature it is within normal limits.  She states that overall her symptoms have worsened ever since she received her Repatha  injection on 12/1, though the symptoms that she has been experiencing preceded this injection.  She is currently in discussion with cardiologist Dr. Wonda regarding reduction of her medications, though they have not initiated any tapering medications yet.  At this time is very unclear what might be causing her symptoms.  I would like to rule out something major such as thyroid  issues, myositis or muscle breakdown, and check on her kidney function as she reports that she has been drinking a lot of water  though her urine appears dark.  No notes that she knows or anybody else at home is experiencing similar symptoms, though wonder if this could have been something viral initially in nature, and its course has been prolonged following Repatha  injection which seems to have worsened all of her symptoms.  I discussed with her that I would not continue anymore Repatha  injections for right now, and we will obtain some additional testing today including thyroid  studies, BMP, UA, CK.  A1c today WNL at 5.2.  We will plan for her to have a close follow-up to see how her symptoms are doing.  I did discuss with her that I recommend she avoid taking Phenergan  unless absolutely necessary, as this may exacerbate the dryness she is experiencing.  At this time is very unclear what is  causing her symptoms. I would like for her to follow up in 3 weeks, and she understands that if her symptoms improved she may cancel this appointment. - f/u TSH, free T4 - f/u BMP - f/u CK - f/u UA - f/u UA - f/u CK - f/u in 3 weeks       Relevant Orders   POC Hbg A1C (Completed)   TSH (Completed)   T4, Free (Completed)   Basic metabolic panel with GFR (Completed)   POCT Urinalysis Dipstick (81002) (Completed)   Other Visit Diagnoses       Nausea       Relevant Orders   TSH (Completed)   T4, Free (Completed)   Basic metabolic panel with GFR (Completed)   POCT Urinalysis Dipstick (81002) (Completed)     Muscle ache       Relevant Orders   CK, total (Completed)       Return in about 3 weeks (around 09/18/2024) for fatigue, muscle aches, dryness.  Patient discussed with Dr. Lovie, who also saw and evaluated the patient.  Doyal Miyamoto, MD [facility]  PGY-1 08/30/2024, 6:28 PM      [1]  Social History Tobacco Use   Smoking status: Former    Current packs/day: 0.00    Average packs/day: 1 pack/day for 26.0 years (26.0 ttl pk-yrs)    Types: Cigarettes    Start date: 02/17/1981    Quit date: 02/18/2007    Years since quitting: 17.5    Passive exposure: Current (husband smokes outside now for the past 25 years)   Smokeless tobacco: Never   Tobacco comments:    Quit smoke in 02/2007  Vaping Use   Vaping status: Never Used  Substance Use Topics   Alcohol use: Not Currently    Comment: Very Rarely   Drug use: No

## 2024-08-28 NOTE — Patient Instructions (Addendum)
 Thank you, [name] for allowing us  to provide your care today. Today we discussed the following:  - We are going to check the labs below which will help us  to rule out some conditions - I want you to hold any further Repatha  injections for now, as you may have had a virus, but then the Repatha  has made your symptoms worse - We are going to check your thyroid  function  - We are also going to check your CK level, which can help us  figure out if there is muscle breakdown/inflammation - Try to avoid using Phenergan  unless it is absolutely necessary from a vomiting standpoint  - You do not have diabetes, your A1c was 5.2 which is normal - I will MyChart message you once the labs return   I have ordered the following labs for you:  Lab Orders         Glucose, capillary         TSH         T4, Free         Basic metabolic panel with GFR         CK, total         POC Hbg A1C         POCT Urinalysis Dipstick (81002)       Follow up: I want you to follow up in ~2-3 weeks   Remember: If your symptoms significantly worsen in the meantime, please go to the ED  Should you have any questions or concerns please call the Internal Medicine Clinic at [phone].     Doyal Miyamoto, MD [facility]

## 2024-08-29 LAB — BASIC METABOLIC PANEL WITH GFR
BUN/Creatinine Ratio: 8 — ABNORMAL LOW (ref 9–23)
BUN: 7 mg/dL (ref 6–24)
CO2: 21 mmol/L (ref 20–29)
Calcium: 9.4 mg/dL (ref 8.7–10.2)
Chloride: 99 mmol/L (ref 96–106)
Creatinine, Ser: 0.83 mg/dL (ref 0.57–1.00)
Glucose: 86 mg/dL (ref 70–99)
Potassium: 4.9 mmol/L (ref 3.5–5.2)
Sodium: 137 mmol/L (ref 134–144)
eGFR: 83 mL/min/1.73 (ref >59)

## 2024-08-29 LAB — CK: Total CK: 28 U/L — ABNORMAL LOW (ref 32–182)

## 2024-08-29 LAB — TSH: TSH: 1.47 u[IU]/mL (ref 0.450–4.500)

## 2024-08-29 LAB — T4, FREE: Free T4: 1.38 ng/dL (ref 0.82–1.77)

## 2024-08-30 DIAGNOSIS — R5383 Other fatigue: Secondary | ICD-10-CM | POA: Insufficient documentation

## 2024-08-30 NOTE — Assessment & Plan Note (Addendum)
 Patient presents to clinic for acute visit regarding roughly 4 weeks of fatigue, muscle aching, right-sided dull headache, dry eyes, dry mouth, and feeling overall dehydrated.  Point-of-care A1c completed today in office of 5.2.  She has had episodes of nausea, with 5 episodes of vomiting without blood.  She states that during this time she has felt feverish at times, though when she checks her temperature it is within normal limits.  She states that overall her symptoms have worsened ever since she received her Repatha  injection on 12/1, though the symptoms that she has been experiencing preceded this injection.  She is currently in discussion with cardiologist Dr. Wonda regarding reduction of her medications, though they have not initiated any tapering medications yet.  At this time is very unclear what might be causing her symptoms.  I would like to rule out something major such as thyroid  issues, myositis or muscle breakdown, and check on her kidney function as she reports that she has been drinking a lot of water  though her urine appears dark.  No notes that she knows or anybody else at home is experiencing similar symptoms, though wonder if this could have been something viral initially in nature, and its course has been prolonged following Repatha  injection which seems to have worsened all of her symptoms.  I discussed with her that I would not continue anymore Repatha  injections for right now, and we will obtain some additional testing today including thyroid  studies, BMP, UA, CK.  A1c today WNL at 5.2.  We will plan for her to have a close follow-up to see how her symptoms are doing.  I did discuss with her that I recommend she avoid taking Phenergan  unless absolutely necessary, as this may exacerbate the dryness she is experiencing.  At this time is very unclear what is causing her symptoms. I would like for her to follow up in 2-3 weeks, and she understands that if her symptoms improve she may cancel  this appointment. - f/u TSH, free T4 - f/u BMP - f/u CK - f/u UA - f/u UA - f/u CK - f/u in 3 weeks

## 2024-08-31 NOTE — Progress Notes (Signed)
 Internal Medicine Clinic Attending  I was physically present during the key portions of the resident provided service and participated in the medical decision making of patient's management care. I reviewed pertinent patient test results.  The assessment, diagnosis, and plan were formulated together and I agree with the documentation in the resident's note.  Lovie Clarity, MD   I'm not 100% sure what is causing Ms Newcomer's 4-weeks of fatigue, muscle aches, dry eyes, dry mouth, and nausea.  These symptoms preceded her first PCSK9 injection - I think it's reasonable to hold this for now, but I don't think it fully explains her symptoms. Today, we considered infection, thyroid  disease, myositis, and kidney injury - basic labs done today were reassuring.  We also confirmed that she's taking the correct dose of Metoprolol  (25mg  daily).   It's possible she is recovering from an acute viral illness, but I would not expect flu/covid/etc to persist this long.  If symptoms not improved, we will see her back in clinic in 1-2 weeks for repeat evaluation. Could consider CMP, SSA, SSB, RF at that visit. She has seen Rheum in 2018

## 2024-09-02 ENCOUNTER — Other Ambulatory Visit: Payer: Self-pay | Admitting: Internal Medicine

## 2024-09-02 ENCOUNTER — Ambulatory Visit: Payer: Self-pay

## 2024-09-02 DIAGNOSIS — G43719 Chronic migraine without aura, intractable, without status migrainosus: Secondary | ICD-10-CM

## 2024-09-03 ENCOUNTER — Other Ambulatory Visit: Payer: Self-pay

## 2024-09-03 DIAGNOSIS — G43719 Chronic migraine without aura, intractable, without status migrainosus: Secondary | ICD-10-CM

## 2024-09-03 MED ORDER — TRAMADOL HCL 50 MG PO TABS: 50.0000 mg | ORAL_TABLET | Freq: Four times a day (QID) | ORAL | 0 refills | Status: AC | PRN

## 2024-09-07 ENCOUNTER — Encounter: Payer: Self-pay | Admitting: Gastroenterology

## 2024-09-07 ENCOUNTER — Ambulatory Visit: Admitting: Gastroenterology

## 2024-09-07 ENCOUNTER — Other Ambulatory Visit: Payer: Self-pay

## 2024-09-07 VITALS — BP 108/72 | HR 67 | Ht 60.0 in | Wt 161.0 lb

## 2024-09-07 DIAGNOSIS — R0789 Other chest pain: Secondary | ICD-10-CM

## 2024-09-07 DIAGNOSIS — R5383 Other fatigue: Secondary | ICD-10-CM

## 2024-09-07 DIAGNOSIS — I25111 Atherosclerotic heart disease of native coronary artery with angina pectoris with documented spasm: Secondary | ICD-10-CM | POA: Diagnosis not present

## 2024-09-07 DIAGNOSIS — R6881 Early satiety: Secondary | ICD-10-CM

## 2024-09-07 DIAGNOSIS — K589 Irritable bowel syndrome without diarrhea: Secondary | ICD-10-CM

## 2024-09-07 DIAGNOSIS — K582 Mixed irritable bowel syndrome: Secondary | ICD-10-CM

## 2024-09-07 DIAGNOSIS — I493 Ventricular premature depolarization: Secondary | ICD-10-CM

## 2024-09-07 DIAGNOSIS — I200 Unstable angina: Secondary | ICD-10-CM

## 2024-09-07 MED ORDER — LINACLOTIDE 145 MCG PO CAPS: ORAL_CAPSULE | ORAL | 0 refills | Status: DC

## 2024-09-07 MED ORDER — VOQUEZNA 10 MG PO TABS: 10.0000 mg | ORAL_TABLET | Freq: Every day | ORAL | Status: AC

## 2024-09-07 NOTE — Progress Notes (Signed)
 "  Chief Complaint: Chest pressure Primary GI MD: Dr. Stacia  HPI: 53 year old female history of anxiety, migraines, IBS and congenital deafness presents for evaluation of chest pressure referred here by cardiology  Last seen 10/23/2023 by Dr. Stacia.  At that time reported IBS alternating diarrhea and constipation since age 56 with worsening symptoms over the last 6 months  -------------TODAY------------------------  Discussed the use of AI scribe software for clinical note transcription with the patient, who gave verbal consent to proceed.  Donna Davis is a 53 year old female with coronary artery disease and multiple cardiac stents who presents with recurrent chest pressure, pain, and spasms for evaluation of possible esophageal etiology.  Language interpreter present  Complex cardiac history with onset of symptoms in June 2025, including multiple cardiac catheterizations and stent placements for significant coronary artery disease.  Recent stent placement 04/2024.  Cardiac workup has included heart monitors, EKGs, and blood work, all normal except for catheterization findings. During catheterization, heart spasms were observed and nitroglycerin  was ineffective, causing migraine.  Since August 27th, 2025, recurrent episodes of severe chest pressure, pain, and spasming described as pressure behind the breast and wrapped around the back, likened to having a heart attack. Symptoms are not associated with eating or exercise, can persist for several days, and then remit before recurring. Pantoprazole  provides relief for heartburn but does not affect chest pressure or spasms.  Significant difficulty swallowing attributed to persistent dry mouth, requiring constant access to fluids. Swallowing difficulty is not related to eating. Ongoing evaluation by primary care. A1c is normal despite previously elevated blood sugars.  Severe constipation since starting cardiac medications, with bowel  movements about once per week. Senokot and a stool softener have been ineffective. Miralax  was previously tried for IBS without benefit. Constipation is worse than prior to cardiac medications. No current prescription constipation medications.  intentional weight loss of approximately 34-35 pounds since onset of cardiac issues, with current weight at 163-166 lbs. Diet consists mainly of fish, vegetables, and fruit. Early satiety and minimal appetite are present.  Last upper endoscopy was in 2013.   PREVIOUS GI WORKUP   CT abdomen with contrast June 14, 2020 Indication: Nausea/vomiting IMPRESSION: 1. No acute process in the abdomen. No explanation for patient's symptoms. 2. Aortic Atherosclerosis (ICD10-I70.0).   CT abdomen/pelvis with contrast May 20, 2019 Indication: Acute generalized abdominal pain IMPRESSION: 1. No evidence of acute abnormalities within the solid abdominal organs. 2. Asymmetric enlarged less than 1 cm in short axis mesenteric lymph nodes in the right lower quadrant of the abdomen. These are nonspecific and may represent reactive lymph nodes, or mesenteric adenitis. Early malignant lymphadenopathy cannot be entirely excluded.   CT abdomen/pelvis with contrast July 23, 2017 Indication: Abdominal pain with nausea, vomiting, diarrhea IMPRESSION: 1. Normal appendix. 2. Right lower quadrant mesenteric lymph nodes raising the question of mesenteric adenitis. 3. No bowel obstruction or abscess. 4. Status post cholecystectomy and hysterectomy   CT abdomen/pelvis with contrast March 04, 2016 Indication: Acute generalized abdominal pain IMPRESSION: 1. No CT evidence for acute intra-abdominal or pelvic process. 2. Status post cholecystectomy and hysterectomy.   CT abdomen/pelvis with contrast June 12, 2015 Indication: Right-sided abdominal pain with nausea and vomiting IMPRESSION: Negative for appendicitis.  Negative exam   CT abdomen/pelvis  with contrast September 29, 2013 Indication: Right lower quadrant pain IMPRESSION:  1. No acute abnormalities involving the abdomen or pelvis. Moderate  stool burden.  2. Diverticulum arising from the medial descending duodenum, at or  near the insertion of the common bile duct. No biliary ductal  dilation    CT abdomen/pelvis with contrast July 01, 2013 Indication: Right-sided abdominal pain IMPRESSION:  1. Small bilateral pleural effusions with right base atelectasis.  2. No evidence for intra-abdominal or pelvic fluid collection to  suggest abscess or biloma.  3. Postoperative change from cholecystectomy.    Right upper quadrant ultrasound June 13, 2013 Indication: Right upper quadrant abdominal pain IMPRESSION:  1. Cholelithiasis without acute cholecystitis.  2. Common bile duct upper normal to minimally dilated for age.  Correlate with bilirubin levels. If these are elevated, consider  further characterization with contrast-enhanced CT or MRCP   CT abdomen/pelvis without contrast March 14, 2013 Indication: Left-sided pain IMPRESSION:  No acute finding.  No evidence of urinary tract stone disease.  No  cause of pain identified    CT abdomen/pelvis with contrast October 23, 2012 Indication: Right lower quadrant pain IMPRESSION:  Negative for appendicitis. No acute finding.    Abdominal ultrasound February 29, 2012 Indication: Abdominal pain, nausea IMPRESSION:  Negative abdominal ultrasound    Abdominal ultrasound December 01, 2010 Indication: Abdominal pain IMPRESSION:  Negative abdominal ultrasound.    There are at least 15 additional abdominal imaging studies prior to these.     Endoscopic history   Colonoscopy 01/2024 Dr. Stacia Indication: Diarrhea/screening - One 3 mm polyp in the cecum, removed with a cold snare. Resected and retrieved.  - One 4 mm polyp in the ascending colon, removed with a cold snare. Resected and retrieved.  - Mild diverticulosis  in the sigmoid colon and in the descending colon.  - The examined portion of the ileum was normal.  - The distal rectum and anal verge are normal on retroflexion view. - biopsies (tubular adenomas)  Colonoscopy 2013 Dr. Jakie Indication: Diarrhea, screening Normal colonoscopy Random biopsies, normal   EGD 2013 Dr. Jakie Indication: diarrhea Normal endoscopic exam Gastric and duodenal biopsies taken to rule out H. pylori and celiac disease   Colonoscopy November 16, 2006 Dr. Aneita Indication: Diarrhea Normal colonoscopy   EGD November 15, 2006 Dr. Obie Indication: Abdominal pain Normal upper endoscopy   Colonoscopy Feb 04, 2006 Indication: Constipation, diarrhea, abdominal pain Normal colonoscopy normal terminal ileum   EGD September 19, 1998 Indication: Abdominal pain Normal endoscopy   Colonoscopy June 07, 1995 Indication: Abdominal pain Normal colonoscopy   Flexible sigmoidoscopy August 31, 1991 Indication: Alternating constipation and diarrhea Normal flexible sigmoidoscopy to 26 cm   53 year old female history of anxiety, migraines, chronic IBS with extensive ED visits including over a dozen CT scans, multiple ultrasounds, and multiple previous EGD/colonoscopies presenting for follow-up of worsening abdominal pain and diarrhea/constipation  IBS, diarrhea predominant Bentyl  partially effective in the past but caused significant constipation.  Adverse reaction to amitriptyline .  Last visit 10/2023 Bentyl  was reintroduced at a lower dose (10 mg as needed) and recommended low FODMAP diet.  Colonoscopy 01/2024 with 2 small tubular adenomas.  Biopsies were not taken for microscopic colitis. - Add on colestipol to Bentyl  10 mg as needed - Consider rifaximin if no improvement with above regimen - Not a candidate for Viberzi given cholecystectomy status  Colon cancer screening Colonoscopy 01/2024 with 2 small tubular adenomas and recall of 7 years - Due for repeat  01/2031)    Discussed the use of AI scribe software for clinical note transcription with the patient, who gave verbal consent to proceed.  History of Present Illness      PREVIOUS GI WORKUP  Past Medical History:  Diagnosis Date   Allergic rhinitis due to allergen 12/19/2016   Anxiety    Anxiety state 09/03/2006   Arrhythmia 02/2024   Arthralgia of right temporomandibular joint 08/26/2018   Bilateral deafness 01/17/2016   Cardiac murmur 03/19/2024   Chest pain of uncertain etiology 03/19/2024   Clostridium difficile infection    x several times   Coronary artery disease 03/2024   2 stents in place medication for the 3rd blockage   Deaf    uses sign language   Elevated LDL cholesterol level 04/07/2024   Family history of breast cancer in mother 08/26/2015   Family history of malignant neoplasm of gastrointestinal tract    Frequent PVCs 03/19/2024   Gallstones    GERD 07/01/2006   Qualifier: Diagnosis of   By: Charmayne MD, Arlyss         Hearing difficulty    b/l, 2/2 congenital rubella s/p stapedectomy. Using hearing aid   Hepatic cyst    6 mm on CT done done in 05/2005   Hyperglycemia 04/07/2024   Hyperlipidemia 02/2024   Hypertension 02/2024   Controlrd with medication   Hypothyroidism    IBS 07/01/2006   Qualifier: Diagnosis of   By: Charmayne MD, Arlyss         Low back pain 10/09/2022   Migraine 12/02/2012   Obesity    Obesity (BMI 35.0-39.9 without comorbidity) 02/18/2014   On hormone replacement therapy 04/07/2024   Pain in thoracic spine 10/09/2022   Palpitations 03/05/2024   PONV (postoperative nausea and vomiting)    sometimes; depends on how long I'm under   Routine adult health maintenance 09/01/2012   MMG - Normal 2014 - she is due and I reminded her to schedule.   Pelvic - hysterectomy, no cervix.  Sees OBGYN  Flu vaccine seasonally.      DEXA - at 53yo      Screening for colon cancer 10/22/2023   Screening mammogram for breast cancer 04/07/2024    Syncope and collapse 03/2024    Past Surgical History:  Procedure Laterality Date   ABDOMINAL ADHESION SURGERY     Enterolysis   CESAREAN SECTION  1994   CHOLECYSTECTOMY N/A 06/26/2013   Procedure: LAPAROSCOPIC CHOLECYSTECTOMY WITH ATTEMPTED INTRAOPERATIVE CHOLANGIOGRAM;  Surgeon: Vicenta DELENA Poli, MD;  Location: [facility];  Service: General;  Laterality: N/A;   COCHLEAR IMPLANT  03/30/2016   COCHLEAR IMPLANT Right 03/2015   COLONOSCOPY WITH PROPOFOL   04/23/2012   CORONARY ANGIOPLASTY  04/2024   CORONARY PRESSURE/FFR STUDY N/A 06/01/2024   Procedure: CORONARY PRESSURE/FFR STUDY;  Surgeon: Mady Bruckner, MD;  Location: [facility];  Service: Cardiovascular;  Laterality: N/A;   CORONARY PRESSURE/FFR WITH 3D MAPPING N/A 04/20/2024   Procedure: Coronary Pressure/FFR w/3D Mapping;  Surgeon: Mady Bruckner, MD;  Location: [facility];  Service: Cardiovascular;  Laterality: N/A;   CORONARY STENT INTERVENTION N/A 04/22/2024   Procedure: CORONARY STENT INTERVENTION;  Surgeon: Darron Deatrice DELENA, MD;  Location: [facility];  Service: Cardiovascular;  Laterality: N/A;   CORONARY ULTRASOUND/IVUS N/A 04/22/2024   Procedure: Coronary Ultrasound/IVUS;  Surgeon: Darron Deatrice DELENA, MD;  Location: [facility];  Service: Cardiovascular;  Laterality: N/A;  RCA   DIAGNOSTIC LAPAROSCOPY  01/14/2006   I've had several (07/23/2017)   ESOPHAGOGASTRODUODENOSCOPY (EGD) WITH PROPOFOL   04/23/2012   JOINT REPLACEMENT     KNEE SURGERY Right 2014   LAPAROSCOPIC OVARIAN CYSTECTOMY Right 04/16/2000   LEFT HEART CATH AND  CORONARY ANGIOGRAPHY N/A 04/20/2024   Procedure: LEFT HEART CATH AND CORONARY ANGIOGRAPHY;  Surgeon: Mady Bruckner, MD;  Location: [facility];  Service: Cardiovascular;  Laterality: N/A;   LEFT HEART CATH AND CORONARY ANGIOGRAPHY N/A 06/01/2024   Procedure: LEFT HEART CATH AND CORONARY ANGIOGRAPHY;  Surgeon: Mady Bruckner, MD;  Location: [facility];   Service: Cardiovascular;  Laterality: N/A;   SALPINGOOPHORECTOMY Left    STAPEDECTOMY Left 05/14/2006   Thyroid  Nodule removed  1997   TONSILLECTOMY     TOTAL ABDOMINAL HYSTERECTOMY  01/13/2004   Endometriosis with residual right ovary. Multiple GYN surgeries for chronic pelvic pain; had LSO in past   TUBAL LIGATION     WOUND EXPLORATION Right 10/16/2016   Procedure: RIGHT EXPLORATION OF LACERATION OF INDEX FINGER;  Surgeon: Franky Curia, MD;  Location: [facility];  Service: Orthopedics;  Laterality: Right;    Current Outpatient Medications  Medication Sig Dispense Refill   acetaminophen  (TYLENOL ) 500 MG tablet Take 1,000 mg by mouth every 6 (six) hours as needed for mild pain (pain score 1-3) or moderate pain (pain score 4-6).     Ascorbic Acid (VITAMIN C PO) Take 1 tablet by mouth in the morning.     aspirin  EC 81 MG tablet Take 81 mg by mouth daily. Swallow whole.     busPIRone  (BUSPAR ) 10 MG tablet Take 2 tablets (20 mg total) by mouth 3 (three) times daily. 540 tablet 2   clonazePAM  (KLONOPIN ) 0.5 MG tablet Take 1 tablet (0.5 mg total) by mouth 2 (two) times daily as needed FOR ANXIETY. 60 tablet 4   diltiazem  (CARDIZEM  CD) 120 MG 24 hr capsule Take 1 capsule (120 mg total) by mouth daily. 30 capsule 3   Docusate Calcium  (STOOL SOFTENER PO) Take 1-2 tablets by mouth daily.     Evolocumab  (REPATHA  SURECLICK) 140 MG/ML SOAJ Inject 140 mg into the skin every 14 (fourteen) days. 6 mL 3   fluticasone  (FLONASE ) 50 MCG/ACT nasal spray SPRAY 1 SPRAY INTO EACH NOSTRIL EVERY DAY (Patient taking differently: Place 1 spray into both nostrils daily.) 16 mL 2   levothyroxine  (SYNTHROID ) 25 MCG tablet Take 1 tablet (25 mcg total) by mouth daily. 90 tablet 3   linaclotide  (LINZESS ) 145 MCG CAPS capsule Take 1 capsule daily. 12 capsule 0   metoprolol  succinate (TOPROL -XL) 25 MG 24 hr tablet Take 1 tablet (25 mg total) by mouth daily. 15 tablet 0   metoprolol  succinate (TOPROL -XL) 50 MG 24  hr tablet TAKE 1 TABLET EVERY DAY WITH A MEAL 90 tablet 3   nitroGLYCERIN  (NITROSTAT ) 0.4 MG SL tablet Place 1 tablet (0.4 mg total) under the tongue every 5 (five) minutes as needed for chest pain. 25 tablet 2   pantoprazole  (PROTONIX ) 40 MG tablet Take 1 tablet (40 mg total) by mouth daily. (Patient taking differently: Take 40 mg by mouth 2 (two) times daily before a meal.) 90 tablet 3   prasugrel  (EFFIENT ) 10 MG TABS tablet Take 1 tablet (10 mg total) by mouth daily. 90 tablet 0   promethazine  (PHENERGAN ) 12.5 MG tablet Take 1 tablet (12.5 mg total) by mouth every 6 (six) hours as needed for nausea or vomiting. 30 tablet 0   ranolazine  (RANEXA ) 500 MG 12 hr tablet Take 2 tablets (1,000 mg total) by mouth 2 (two) times daily. 60 tablet 3   traMADol  (ULTRAM ) 50 MG tablet Take 1 tablet (50 mg total) by mouth every 6 (six) hours as needed for  severe pain (pain score 7-10). 40 tablet 0   Vonoprazan Fumarate  (VOQUEZNA ) 10 MG TABS Take 10 mg by mouth daily.     No current facility-administered medications for this visit.    Allergies as of 09/07/2024 - Review Complete 09/07/2024  Allergen Reaction Noted   Onion Anaphylaxis 11/28/2010   Amitriptyline  Anxiety, Palpitations, and Other (See Comments) 04/08/2013   Amoxicillin -pot clavulanate Nausea And Vomiting    Bacitracin Hives and Other (See Comments) 07/11/2023   Cefuroxime Nausea And Vomiting 08/06/2008   Cephalexin  Nausea And Vomiting    Ciprofloxacin  Other (See Comments)    Clarithromycin Nausea And Vomiting and Other (See Comments)    Corticosteroids Other (See Comments) 05/29/2024   Doxycycline  Hives and Nausea And Vomiting    Isosorbide  mononitrate [isosorbide  nitrate] Other (See Comments) 04/23/2024   Ondansetron  Other (See Comments) 01/20/2018   Propranolol  Other (See Comments) 01/07/2013   Sulfa antibiotics Other (See Comments) 03/04/2016   Sulfonamide derivatives Other (See Comments)    Benadryl  [diphenhydramine  hcl (sleep)]  Anxiety 06/26/2013    Family History  Problem Relation Age of Onset   Breast cancer Mother    Ovarian cancer Mother    Hypertension Mother    Stroke Father    Neuropathy Father    Diabetes Father    Heart disease Father        heart attack   Irritable bowel syndrome Father    Kidney disease Father    Heart attack Father        Diabetic enduced heart attack   Hyperlipidemia Father    Hypertension Father    Stomach cancer Maternal Grandmother    Colon cancer Maternal Grandmother    Diabetes Maternal Grandmother    Bipolar disorder Daughter    Heart disease Other        both grandmothers   Heart attack Maternal Uncle        Coronary artery disease   Hyperlipidemia Maternal Uncle    Hypertension Maternal Uncle    Esophageal cancer Neg Hx    Liver disease Neg Hx     Social History   Socioeconomic History   Marital status: Married    Spouse name: Not on file   Number of children: 2   Years of education: Not on file   Highest education level: Not on file  Occupational History   Occupation: immunologist    Comment: works in [place]   Tobacco Use   Smoking status: Former    Current packs/day: 0.00    Average packs/day: 1 pack/day for 26.0 years (26.0 ttl pk-yrs)    Types: Cigarettes    Start date: 02/17/1981    Quit date: 02/18/2007    Years since quitting: 17.5    Passive exposure: Current (husband smokes outside now for the past 25 years)   Smokeless tobacco: Never   Tobacco comments:    Quit smoke in 02/2007  Vaping Use   Vaping status: Never Used  Substance and Sexual Activity   Alcohol use: Not Currently    Comment: Very Rarely   Drug use: No   Sexual activity: Not Currently    Partners: Male    Birth control/protection: None    Comment: Married same person 36 years  Other Topics Concern   Not on file  Social History Narrative   Current smoker, not using alcohol or drugs at this timeLives with husband and 2 children      Are you right handed  or left handed? Right Handed  Are you currently employed ? 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 No    Who lives with you? Husband and 85 pound dog    What type of home do you live in: 1 story or 2 story? Lives in a one story home        Social Drivers of Health   Tobacco Use: Medium Risk (09/07/2024)   Patient History    Smoking Tobacco Use: Former    Smokeless Tobacco Use: Never    Passive Exposure: Current  Physicist, Medical Strain: Not on file  Food Insecurity: No Food Insecurity (08/05/2024)   Epic    Worried About Programme Researcher, Broadcasting/film/video in the Last Year: Never true    Ran Out of Food in the Last Year: Never true  Transportation Needs: No Transportation Needs (06/03/2024)   Epic    Lack of Transportation (Medical): No    Lack of Transportation (Non-Medical): No  Physical Activity: Not on file  Stress: Not on file  Social Connections: Not on file  Intimate Partner Violence: Not At Risk (06/03/2024)   Epic    Fear of Current or Ex-Partner: No    Emotionally Abused: No    Physically Abused: No    Sexually Abused: No  Depression (PHQ2-9): Low Risk (08/28/2024)   Depression (PHQ2-9)    PHQ-2 Score: 0  Recent Concern: Depression (PHQ2-9) - Medium Risk (06/25/2024)   Depression (PHQ2-9)    PHQ-2 Score: 5  Alcohol Screen: Not on file  Housing: Unknown (06/03/2024)   Epic    Unable to Pay for Housing in the Last Year: No    Number of Times Moved in the Last Year: Not on file    Homeless in the Last Year: No  Utilities: Not At Risk (06/03/2024)   Epic    Threatened with loss of utilities: No  Health Literacy: Not on file    Review of Systems:    Constitutional: No weight loss, fever, chills, weakness or fatigue HEENT: Eyes: No change in vision               Ears, Nose, Throat:  No change in hearing or congestion Skin: No rash or itching Cardiovascular: No chest pain, chest pressure or palpitations   Respiratory: No SOB or  cough Gastrointestinal: See HPI and otherwise negative Genitourinary: No dysuria or change in urinary frequency Neurological: No headache, dizziness or syncope Musculoskeletal: No new muscle or joint pain Hematologic: No bleeding or bruising Psychiatric: No history of depression or anxiety    Physical Exam:  Vital signs: BP 108/72   Pulse 67   Ht 5' (1.524 m)   Wt 161 lb (73 kg)   LMP 12/26/2004   SpO2 100%   BMI 31.44 kg/m   Constitutional: NAD, alert and cooperative Head:  Normocephalic and atraumatic. Eyes:   PEERL, EOMI. No icterus. Conjunctiva pink. Respiratory: Respirations even and unlabored. Lungs clear to auscultation bilaterally.   No wheezes, crackles, or rhonchi.  Cardiovascular:  Regular rate and rhythm. No peripheral edema, cyanosis or pallor.  Gastrointestinal:  Soft, nondistended, nontender. No rebound or guarding. Normal bowel sounds. No appreciable masses or hepatomegaly. Rectal:  Declines Msk:  Symmetrical without gross deformities. Without edema, no deformity or joint abnormality.  Neurologic:  Alert and  oriented x4;  grossly normal neurologically.  Skin:   Dry and intact without significant lesions or rashes. Psychiatric: Oriented to person, place and time. Demonstrates  good judgement and reason without abnormal affect or behaviors.  Physical Exam    RELEVANT LABS AND IMAGING: CBC    Component Value Date/Time   WBC 5.8 08/11/2024 1204   RBC 4.19 08/11/2024 1204   HGB 12.9 08/11/2024 1204   HGB 13.4 06/19/2024 1015   HCT 37.4 08/11/2024 1204   HCT 40.4 06/19/2024 1015   PLT 266 08/11/2024 1204   PLT 291 06/19/2024 1015   MCV 89.3 08/11/2024 1204   MCV 92 06/19/2024 1015   MCH 30.8 08/11/2024 1204   MCHC 34.5 08/11/2024 1204   RDW 12.0 08/11/2024 1204   RDW 12.4 06/19/2024 1015   LYMPHSABS 2.1 08/11/2024 1204   LYMPHSABS 2.5 06/30/2021 1140   MONOABS 0.5 08/11/2024 1204   EOSABS 0.1 08/11/2024 1204   EOSABS 0.2 06/30/2021 1140   BASOSABS  0.0 08/11/2024 1204   BASOSABS 0.0 06/30/2021 1140    CMP     Component Value Date/Time   NA 137 08/28/2024 1159   K 4.9 08/28/2024 1159   CL 99 08/28/2024 1159   CO2 21 08/28/2024 1159   GLUCOSE 86 08/28/2024 1159   GLUCOSE 85 08/11/2024 1204   BUN 7 08/28/2024 1159   CREATININE 0.83 08/28/2024 1159   CREATININE 0.68 02/17/2014 0916   CALCIUM  9.4 08/28/2024 1159   PROT 7.1 08/11/2024 1204   PROT 7.2 05/31/2022 0954   ALBUMIN 4.3 08/11/2024 1204   ALBUMIN 4.5 05/31/2022 0954   AST 22 08/11/2024 1204   ALT 12 08/11/2024 1204   ALKPHOS 52 08/11/2024 1204   BILITOT 0.3 08/11/2024 1204   BILITOT <0.2 05/31/2022 0954   GFRNONAA >60 08/11/2024 1204   GFRNONAA >89 02/17/2014 0916   GFRAA >60 06/14/2020 1152   GFRAA >89 02/17/2014 0916     Assessment/Plan:   53 year old female history of anxiety, migraines, chronic IBS with extensive ED visits including over a dozen CT scans, multiple ultrasounds, and multiple previous EGD/colonoscopies presenting for evaluation of chest spasms  IBS, diarrhea predominant previously now having constipation  Colonoscopy 01/2024 with 2 small tubular adenomas.  Biopsies were not taken for microscopic colitis.  Now having constipation since being on new heart medications with no improvement on MiraLAX  or over-the-counter remedies - Trial of Linzess  145 mcg, samples given - Increase water , increase fiber, increase exercise  Early satiety Notes feeling full all the time and getting full few bites and eating. - Gastric emptying study  Dry mouth Patient reports persistent dry mouth secondary to multitude of her medications.  Makes it hard to swallow sometimes but typically not an issue with eating.  Less likely be esophageal  Chest pressure Notes a constant chest pressure in the left side behind her breast wrapping to her back that lasts for a few days and then dissipates.  Cardiology feels it is esophageal spasm.  No change with eating.  No  improvement on PPI twice daily.  Recent heart cath with stent placement and noted heart spasm at that time.  Due to recent stent placement and being on anticoagulation she is very high risk to undergo EGD, also not convinced her chest pressure is esophageal in nature as it is constant and does not change and has not improved on PPI.  However we will optimize acid regimen in hopes it may help and consider EGD when she is at least 6 months out from her stent placement.  Only thing that has helped it is nitro but this causes her migraines which is indicative of cardiac etiology  of her chest pressure (likely coronary vasospasms) - Stop pantoprazole  - Start voquezna  10mg , samples given - Educated patient on lifestyle modifications provided patient education handout - Follow-up in February to discuss possibility of EGD  CAD s/p DES to RCA and angioplasty/DES to LCx 04/2024 Runs of SVT monitor 03/2024 TTE with LVEF 65 to 70% DAPT with aspirin  81 mg and Effient  10 mg  Colon cancer screening Colonoscopy 01/2024 with 2 small tubular adenomas and recall of 7 years - Due for repeat 01/2031    Nestor Blower, PA-C [facility] 09/07/2024, 11:53 AM  Cc: [name], MD "

## 2024-09-07 NOTE — Patient Instructions (Signed)
 You have been scheduled for a gastric emptying scan at [facility] on 09/24/24 at 8:00 am. Please arrive at least 30 minutes prior to your appointment for registration. Please make certain not to have anything to eat or drink after midnight the night before your test. Hold all stomach medications (ex: Zofran , phenergan , Reglan ) 24 hours prior to your test. If you need to reschedule your appointment, please contact radiology scheduling at [phone]. _____________________________________________________________________ A gastric-emptying study measures how long it takes for food to move through your stomach. There are several ways to measure stomach emptying. In the most common test, you eat food that contains a small amount of radioactive material. A scanner that detects the movement of the radioactive material is placed over your abdomen to monitor the rate at which food leaves your stomach. This test normally takes about 4 hours to complete. _____________________________________________________________________   We have sent the following medications to your pharmacy for you to pick up at your convenience: Voquezna  10 mg once daily.  Linzess  145 mcg once daily 20-30 minutes before breakfast.  We have given you samples of the following medication to take:  Voquezna  10mg  and Linzess  145 mcg.  Thank you for trusting me with your gastrointestinal care!   Nestor Blower, PA  _______________________________________________________  If your blood pressure at your visit was 140/90 or greater, please contact your primary care physician to follow up on this.  _______________________________________________________  If you are age 55 or older, your body mass index should be between 23-30. Your Body mass index is 31.44 kg/m. If this is out of the aforementioned range listed, please consider follow up with your Primary Care Provider.  If you are age 55 or younger, your body mass index  should be between 19-25. Your Body mass index is 31.44 kg/m. If this is out of the aformentioned range listed, please consider follow up with your Primary Care Provider.   ________________________________________________________  The [facility] providers would like to encourage you to use MYCHART to communicate with providers for non-urgent requests or questions.  Due to long hold times on the telephone, sending your provider a message by [facility] may be a faster and more efficient way to get a response.  Please allow 48 business hours for a response.  Please remember that this is for non-urgent requests.  _______________________________________________________  [facility] is using a team-based approach to care.  Your team is made up of your doctor and two to three APPS. Our APPS (Nurse Practitioners and Physician Assistants) work with your physician to ensure care continuity for you. They are fully qualified to address your health concerns and develop a treatment plan. They communicate directly with your gastroenterologist to care for you. Seeing the Advanced Practice Practitioners on your physician's team can help you by facilitating care more promptly, often allowing for earlier appointments, access to diagnostic testing, procedures, and other specialty referrals.

## 2024-09-08 ENCOUNTER — Ambulatory Visit: Payer: Self-pay

## 2024-09-08 ENCOUNTER — Other Ambulatory Visit: Payer: Self-pay

## 2024-09-08 DIAGNOSIS — R5383 Other fatigue: Secondary | ICD-10-CM

## 2024-09-08 DIAGNOSIS — U071 COVID-19: Secondary | ICD-10-CM

## 2024-09-08 MED ORDER — FLUTICASONE PROPIONATE 50 MCG/ACT NA SUSP
1.0000 | Freq: Every day | NASAL | 2 refills | Status: AC
  Filled 2024-09-08: qty 16, 60d supply, fill #0

## 2024-09-08 NOTE — Progress Notes (Signed)
 "  Patient name: Donna Davis Date of birth: 1971-05-16 Date of visit: 09/13/2024  Type of visit: Established Patient Office Visit   Subjective   Chief concern:  Chief Complaint  Patient presents with   Follow-up   Dizziness    Donna Davis is a 53 y.o. female with a history of HTN, CAD, unstable angina, bilateral deafness, migraines, GERD, HLD, who presents to [facility] clinic for 2 week follow up of fatigue, muscle aches and perceived dryness.  In-person ASL interpreter Mateo Hummer) was used for interpretation of this visit.  She is accompanied by her husband as well who assists with history portion of the visit.  I last saw her in the [facility] on 08/28/2024 regarding fatigue, nausea, generalized muscle aches, dull headache, dry eyes, dry mouth, and feeling overall dehydrated for 4 weeks prior to that.  Of note she did receive her first Repatha  injection on 12/1, though her symptoms preceded that injection.  It was very unclear to me what may be causing her symptoms, so we obtained basic labs including thyroid  studies, BMP, CK, UA, A1c.  A1c was 5.2.  TSH was wnl at 1.470, freeT4 1.38, BMP wnl, CK 28, and UA with 1+ leukocytes but otherwise wnl.  Given initial laboratory workup was normal, I requested patient return for labs including AM cortisol, CMP, RF, SSA and SSB to assess whether vague and nonspecific symptoms may be related to adrenal insufficiency or Sjogren syndrome.  Since last seeing me, she reports that she saw GI yesterday who switched her antacid medication and advised that she start an IBS medication after Christmas to help with her constipation.  By chart review it does appear that she has been recommended to start linaclotide  145 mcg daily.  In terms of her symptoms, they are grossly unchanged.  She does note that she has had a few nosebleeds in the past 2 weeks as well.  She had 3 more vomiting episodes in the past 2 weeks and did require utilizing half of Phenergan  (6.25 mg) on 2  occasions.  She continues to endorse that she will sometimes feel feverish, though when she checks her temperature the thermometer reads within normal limits.  She continues to endorse refilling her water  bottle many times as she feels dehydrated.  She denies known sick contacts, recent travel, working outside, or hiking.  She denies any rashes, and no recent vector bites that she is aware of.  She does show me some pictures of blood pressure readings which are rather low at home in the SBP 80s and DBP 50s to 60s.  Orthostatic vital signs were negative for orthostatic hypotension taken in the office today, though she did report dizziness with position change.  Orthostatic vitals:  Lying: 126/80, 65 bpm Sitting: 125/82, 59 pbm Standing: 118/79, 62 pbm 3 min standing: 124/84, 61 bpm   ROS: Negative unless otherwise listed in the HPI.   Patient Active Problem List   Diagnosis Date Noted   Fatigue 08/30/2024   Physical deconditioning 07/11/2024   Primary hypertension 05/29/2024   CAD (coronary artery disease) 04/23/2024   Unstable angina (HCC) 04/20/2024   Syncope and collapse 04/14/2024   Anxiety    Clostridium difficile infection    Family history of malignant neoplasm of gastrointestinal tract    Gallstones    Hepatic cyst    Obesity    PONV (postoperative nausea and vomiting)    Hyperglycemia 04/07/2024   Elevated LDL cholesterol level 04/07/2024   Screening mammogram for  breast cancer 04/07/2024   Cardiac murmur 03/19/2024   Chest pain of uncertain etiology 03/19/2024   Frequent PVCs 03/19/2024   Palpitations 03/05/2024   Screening for colon cancer 10/22/2023   Low back pain 10/09/2022   Pain in thoracic spine 10/09/2022   Arthralgia of right temporomandibular joint 08/26/2018   Allergic rhinitis due to allergen 12/19/2016   Bilateral deafness 01/17/2016   Family history of breast cancer in mother 08/26/2015   Obesity (BMI 35.0-39.9 without comorbidity) 02/18/2014    Migraine 12/02/2012   Routine adult health maintenance 09/01/2012   Anxiety state 09/03/2006   Hypothyroidism 07/01/2006   GERD 07/01/2006   IBS 07/01/2006     Past Surgical History:  Procedure Laterality Date   ABDOMINAL ADHESION SURGERY     Enterolysis   CESAREAN SECTION  1994   CHOLECYSTECTOMY N/A 06/26/2013   Procedure: LAPAROSCOPIC CHOLECYSTECTOMY WITH ATTEMPTED INTRAOPERATIVE CHOLANGIOGRAM;  Surgeon: Vicenta DELENA Poli, MD;  Location: [facility];  Service: General;  Laterality: N/A;   COCHLEAR IMPLANT  03/30/2016   COCHLEAR IMPLANT Right 03/2015   COLONOSCOPY WITH PROPOFOL   04/23/2012   CORONARY ANGIOPLASTY  04/2024   CORONARY PRESSURE/FFR STUDY N/A 06/01/2024   Procedure: CORONARY PRESSURE/FFR STUDY;  Surgeon: Mady Bruckner, MD;  Location: [facility];  Service: Cardiovascular;  Laterality: N/A;   CORONARY PRESSURE/FFR WITH 3D MAPPING N/A 04/20/2024   Procedure: Coronary Pressure/FFR w/3D Mapping;  Surgeon: Mady Bruckner, MD;  Location: [facility];  Service: Cardiovascular;  Laterality: N/A;   CORONARY STENT INTERVENTION N/A 04/22/2024   Procedure: CORONARY STENT INTERVENTION;  Surgeon: Darron Deatrice DELENA, MD;  Location: [facility];  Service: Cardiovascular;  Laterality: N/A;   CORONARY ULTRASOUND/IVUS N/A 04/22/2024   Procedure: Coronary Ultrasound/IVUS;  Surgeon: Darron Deatrice DELENA, MD;  Location: [facility];  Service: Cardiovascular;  Laterality: N/A;  RCA   DIAGNOSTIC LAPAROSCOPY  01/14/2006   I've had several (07/23/2017)   ESOPHAGOGASTRODUODENOSCOPY (EGD) WITH PROPOFOL   04/23/2012   JOINT REPLACEMENT     KNEE SURGERY Right 2014   LAPAROSCOPIC OVARIAN CYSTECTOMY Right 04/16/2000   LEFT HEART CATH AND CORONARY ANGIOGRAPHY N/A 04/20/2024   Procedure: LEFT HEART CATH AND CORONARY ANGIOGRAPHY;  Surgeon: Mady Bruckner, MD;  Location: [facility];  Service: Cardiovascular;  Laterality: N/A;   LEFT HEART CATH AND CORONARY ANGIOGRAPHY  N/A 06/01/2024   Procedure: LEFT HEART CATH AND CORONARY ANGIOGRAPHY;  Surgeon: Mady Bruckner, MD;  Location: [facility];  Service: Cardiovascular;  Laterality: N/A;   SALPINGOOPHORECTOMY Left    STAPEDECTOMY Left 05/14/2006   Thyroid  Nodule removed  1997   TONSILLECTOMY     TOTAL ABDOMINAL HYSTERECTOMY  01/13/2004   Endometriosis with residual right ovary. Multiple GYN surgeries for chronic pelvic pain; had LSO in past   TUBAL LIGATION     WOUND EXPLORATION Right 10/16/2016   Procedure: RIGHT EXPLORATION OF LACERATION OF INDEX FINGER;  Surgeon: Franky Curia, MD;  Location: [facility];  Service: Orthopedics;  Laterality: Right;     Current Outpatient Medications  Medication Instructions   acetaminophen  (TYLENOL ) 1,000 mg, Every 6 hours PRN   Ascorbic Acid (VITAMIN C PO) 1 tablet, Every morning   aspirin  EC 81 mg, Daily   busPIRone  (BUSPAR ) 20 mg, Oral, 3 times daily   clonazePAM  (KLONOPIN ) 0.5 MG tablet Take 1 tablet (0.5 mg total) by mouth 2 (two) times daily as needed FOR ANXIETY.   diltiazem  (CARDIZEM  CD) 120 mg, Oral, Daily  Docusate Calcium  (STOOL SOFTENER PO) 1-2 tablets, Daily   fluticasone  (FLONASE ) 50 MCG/ACT nasal spray SPRAY 1 SPRAY INTO EACH NOSTRIL EVERY DAY   levothyroxine  (SYNTHROID ) 25 mcg, Oral, Daily   linaclotide  (LINZESS ) 145 MCG CAPS capsule Take 1 capsule daily.   metoprolol  succinate (TOPROL -XL) 50 MG 24 hr tablet TAKE 1 TABLET EVERY DAY WITH A MEAL   metoprolol  succinate (TOPROL -XL) 25 mg, Oral, Daily   nitroGLYCERIN  (NITROSTAT ) 0.4 mg, Sublingual, Every 5 min PRN   pantoprazole  (PROTONIX ) 40 mg, Oral, Daily   prasugrel  (EFFIENT ) 10 mg, Oral, Daily   promethazine  (PHENERGAN ) 12.5 mg, Oral, Every 6 hours PRN   ranolazine  (RANEXA ) 1,000 mg, Oral, 2 times daily   Repatha  SureClick 140 mg, Subcutaneous, Every 14 days   traMADol  (ULTRAM ) 50 mg, Oral, Every 6 hours PRN   Voquezna  10 mg, Oral, Daily    Social History[1]    Objective   Today's Vitals   09/08/24 0813  BP: 104/68  Pulse: 76  SpO2: 98%  Weight: 162 lb 12.8 oz (73.8 kg)  Height: 5' (1.524 m)  Body mass index is 31.79 kg/m.   Physical Exam:   Constitutional: well-appearing female sitting in exam chair, in no acute distress. Ambulates without use of assistance device HEENT: normocephalic atraumatic, mucous membranes moist, lips dry  Eyes: conjunctiva non-erythematous Neck: supple Cardiovascular: regular rate and rhythm, bilateral radial pulses 2+, bilateral dorsal pedal pulses 2+, brisk capillary refill bilateral feet and hands  Pulmonary/Chest: normal work of breathing on room air, lungs clear to auscultation bilaterally Abdominal: soft, non-tender, non-distended MSK: normal bulk and tone. Diffuse tenderness to palpation of muscles of upper and lower extremities, neck and back. No joint pain, effusion, erythema, or warmth present of shoulders, elbows, wrists, hands, knees, ankles, or toes.  Neurological: alert & oriented x 3 Skin: warm and dry, no ulcers or lesions on bilateral feet, no rashes or bites  Psych: mood calm, behavior normal, thought content normal, judgement normal    Last CBC Lab Results  Component Value Date   WBC 5.8 08/11/2024   HGB 12.9 08/11/2024   HCT 37.4 08/11/2024   MCV 89.3 08/11/2024   MCH 30.8 08/11/2024   RDW 12.0 08/11/2024   PLT 266 08/11/2024   Last metabolic panel Lab Results  Component Value Date   GLUCOSE 86 08/28/2024   NA 137 08/28/2024   K 4.9 08/28/2024   CL 99 08/28/2024   CO2 21 08/28/2024   BUN 7 08/28/2024   CREATININE 0.83 08/28/2024   EGFR 83 08/28/2024   CALCIUM  9.4 08/28/2024   PROT 6.4 09/08/2024   ALBUMIN 4.3 09/08/2024   LABGLOB 2.7 05/31/2022   AGRATIO 1.7 05/31/2022   BILITOT 0.5 09/08/2024   ALKPHOS 46 (L) 09/08/2024   AST 16 09/08/2024   ALT 12 09/08/2024   ANIONGAP 12 08/11/2024   Last hemoglobin A1c Lab Results  Component Value Date   HGBA1C 5.2 08/28/2024   Last  thyroid  functions Lab Results  Component Value Date   TSH 1.470 08/28/2024   FREET4 1.38 08/28/2024      The 10-year ASCVD risk score (Arnett DK, et al., 2019) is: 2%   Values used to calculate the score:     Age: 50 years     Clinically relevant sex: Female     Is Non-Hispanic African American: No     Diabetic: No     Tobacco smoker: No     Systolic Blood Pressure: 104 mmHg     Is  BP treated: Yes     HDL Cholesterol: 52 mg/dL     Total Cholesterol: 209 mg/dL      Assessment & Plan  Problem List Items Addressed This Visit     Fatigue   I last saw her in the [facility] on 08/28/2024 regarding fatigue, nausea, generalized muscle aches, dull headache, dry eyes, dry mouth, and feeling overall dehydrated for 4 weeks prior to that.  Of note she did receive her first Repatha  injection on 12/1, though her symptoms preceded that injection.  It was very unclear to me what may be causing her symptoms, so we obtained basic labs including thyroid  studies, BMP, CK, UA, A1c.  A1c was 5.2. TSH was wnl at 1.470, freeT4 1.38, BMP wnl, CK 28, and UA with 1+ leukocytes but otherwise wnl.  Given initial laboratory workup was normal, I requested patient return for labs including AM cortisol, CMP, RF, SSA and SSB to assess whether vague and nonspecific symptoms may be related to adrenal insufficiency or Sjogren syndrome.  Today she presents to clinic stating that symptoms are grossly unchanged, though new since last visit was that she has had a few nosebleeds.  She is on blood thinners, and we discussed appropriate nosebleed management, which I suspect are in part due to the cold and dry air, though did discuss recommendation for humidified air, how to appropriately stop nosebleeding, and return precautions to ED if severe.  Today we will plan to obtain AM cortisol, ESR, CRP, ANA with FANA staining patterns, RF, SSA, SSB and liver function panel.  I will send her a MyChart message with her lab results.  She has also  been in discussion with GI who she saw yesterday, they switched her to a different antacid medication and have advised that she start linaclotide  for IBS after Christmas for her constipation.  Orthostatic vital signs today in the office negative for orthostatic hypotension.  Physical exam grossly normal, though with diffuse muscle tenderness of all extremities, neck, and back.  No joint tenderness, effusion, erythema, swelling, or warmth of upper or lower extremities.  No rashes or vector bites noted.  She has been in contact with cardiology Dr. Wonda regarding her symptoms, and he is planning to begin tapering her medications to see if these may be contributing when she was started at higher doses of her meds.  I will reach out to Dr. Wonda as she is currently on diltiazem  and metoprolol , and is experiencing episodes of low blood pressure readings, and see if he wishes to begin her medication taper sooner than her visit with them on 09/22/2024.  If her laboratory testing is within normal limits, then I would want her to follow-up in the [facility] after she has seen Dr. Wonda and started her medication taper to check in on her symptoms, and it would be reasonable at that time to also obtain a vitamin D level, vitamin B12 level, and folate level. - f/u ESR, CRP, ANA, FANA staining pattern, RF, SSA, SSB, LFTs, AM coritsol - if above labs are wnl, f/u in January 2026 and reasonable to consider obtaining Vitamin D level, Vitamin B12 level, and folate levels  - Advised holding any further Repatha  injections for now while she is experiencing these symptoms  - I will reach out to Dr. Wonda regarding Dilt & Metoprolol    ADDENDUM 09/13/2024: - Per Dr. Wonda on 12/23 okay to discontinue Diltiazem . Notified patient via MyChart regarding this.  - all labs wnl, with ANA positive and ratio  borderline level at 1:80, though unlikely to have autoimmune disease in the setting of all other labs wnl. Notified patient via MyChart  and recommend f/u in 09/2024 in the [facility] for symptom recheck once she has seen Dr. Wonda and continues her medication taper.       Relevant Orders   ANA, IFA (with reflex) (Completed)   Hepatic function panel (Completed)   Sed Rate (ESR) (Completed)   CRP (C-Reactive Protein) (Completed)   Other Visit Diagnoses       COVID       Relevant Medications   fluticasone  (FLONASE ) 50 MCG/ACT nasal spray       No follow-ups on file.  Patient discussed with Dr. Trudy.   Doyal Miyamoto, MD [facility]  PGY-1 09/13/2024, 6:02 PM      [1]  Social History Tobacco Use   Smoking status: Former    Current packs/day: 0.00    Average packs/day: 1 pack/day for 26.0 years (26.0 ttl pk-yrs)    Types: Cigarettes    Start date: 02/17/1981    Quit date: 02/18/2007    Years since quitting: 17.5    Passive exposure: Current (husband smokes outside now for the past 25 years)   Smokeless tobacco: Never   Tobacco comments:    Quit smoke in 02/2007  Vaping Use   Vaping status: Never Used  Substance Use Topics   Alcohol use: Not Currently    Comment: Very Rarely   Drug use: No   "

## 2024-09-08 NOTE — Patient Instructions (Signed)
 Thank you, Ms.Ardyn P Diantonio for allowing us  to provide your care today. Today we discussed the following:  - I will MyChart message you with your lab results - I am going to reach out to Dr. Wonda to discuss your symptoms and whether or not they want to begin the taper prior to seeing you in January - Please do not drive until Dr. Wonda gives you clearance    I have ordered the following labs for you:   Lab Orders         ANA, IFA (with reflex)         Hepatic function panel         Sed Rate (ESR)         CRP (C-Reactive Protein)       I have ordered the following medication/changed the following medications:   Stop the following medications: Medications Discontinued During This Encounter  Medication Reason   fluticasone  (FLONASE ) 50 MCG/ACT nasal spray Reorder     Start the following medications: Meds ordered this encounter  Medications   fluticasone  (FLONASE ) 50 MCG/ACT nasal spray    Sig: SPRAY 1 SPRAY INTO EACH NOSTRIL EVERY DAY    Dispense:  16 g    Refill:  2     Follow up: please make sure you attend the follow up with Dr. Margurite office    Should you have any questions or concerns please call the Internal Medicine Clinic at [phone].     Doyal Miyamoto, MD [facility]

## 2024-09-09 LAB — HEPATIC FUNCTION PANEL
ALT: 12 IU/L (ref 0–32)
AST: 16 IU/L (ref 0–40)
Albumin: 4.3 g/dL (ref 3.8–4.9)
Alkaline Phosphatase: 46 IU/L — ABNORMAL LOW (ref 49–135)
Bilirubin Total: 0.5 mg/dL (ref 0.0–1.2)
Bilirubin, Direct: 0.19 mg/dL (ref 0.00–0.40)
Total Protein: 6.4 g/dL (ref 6.0–8.5)

## 2024-09-09 LAB — CORTISOL-AM, BLOOD: Cortisol - AM: 15.2 ug/dL (ref 6.2–19.4)

## 2024-09-09 LAB — FANA STAINING PATTERNS: Speckled Pattern: 1:80 {titer}

## 2024-09-09 LAB — SEDIMENTATION RATE: Sed Rate: 18 mm/h (ref 0–40)

## 2024-09-09 LAB — SJOGRENS SYNDROME-A EXTRACTABLE NUCLEAR ANTIBODY: ENA SSA (RO) Ab: 0.2 AI (ref 0.0–0.9)

## 2024-09-09 LAB — C-REACTIVE PROTEIN: CRP: 10 mg/L (ref 0–10)

## 2024-09-09 LAB — SJOGRENS SYNDROME-B EXTRACTABLE NUCLEAR ANTIBODY: ENA SSB (LA) Ab: <0.2 AI (ref 0.0–0.9)

## 2024-09-09 LAB — RHEUMATOID FACTOR: Rheumatoid fact SerPl-aCnc: <10 [IU]/mL

## 2024-09-09 LAB — ANTINUCLEAR ANTIBODIES, IFA: ANA Titer 1: POSITIVE — AB

## 2024-09-11 NOTE — Progress Notes (Signed)
 Agree with the assessment and plan as outlined by Ellouise Console, PA-C.  Esophageal spasm difficult to diagnose as it requires the patient to experience the symptoms while undergoing a diagnostic study (esophagram or esophageal manometry).  Spasm usually independent of reflux , so lack of response to PPI does not make spasm more or less likely.  Treatment of esophageal spasm also difficult as there is no proven medical therapy.  She is already taking diltiazem  which is frequently used to treat esophageal spasm.  Other therapies that could be considered are peppermints (Altoids) or TCAs.  If symptoms not improved with Voquenza, would consider these options or undergoing esophagram or manometry.

## 2024-09-13 ENCOUNTER — Ambulatory Visit: Payer: Self-pay

## 2024-09-13 NOTE — Assessment & Plan Note (Addendum)
 I last saw her in the [facility] on 08/28/2024 regarding fatigue, nausea, generalized muscle aches, dull headache, dry eyes, dry mouth, and feeling overall dehydrated for 4 weeks prior to that.  Of note she did receive her first Repatha  injection on 12/1, though her symptoms preceded that injection.  It was very unclear to me what may be causing her symptoms, so we obtained basic labs including thyroid  studies, BMP, CK, UA, A1c.  A1c was 5.2. TSH was wnl at 1.470, freeT4 1.38, BMP wnl, CK 28, and UA with 1+ leukocytes but otherwise wnl.  Given initial laboratory workup was normal, I requested patient return for labs including AM cortisol, CMP, RF, SSA and SSB to assess whether vague and nonspecific symptoms may be related to adrenal insufficiency or Sjogren syndrome.  Today she presents to clinic stating that symptoms are grossly unchanged, though new since last visit was that she has had a few nosebleeds.  She is on blood thinners, and we discussed appropriate nosebleed management, which I suspect are in part due to the cold and dry air, though did discuss recommendation for humidified air, how to appropriately stop nosebleeding, and return precautions to ED if severe.  Today we will plan to obtain AM cortisol, ESR, CRP, ANA with FANA staining patterns, RF, SSA, SSB and liver function panel.  I will send her a MyChart message with her lab results.  She has also been in discussion with GI who she saw yesterday, they switched her to a different antacid medication and have advised that she start linaclotide  for IBS after Christmas for her constipation.  Orthostatic vital signs today in the office negative for orthostatic hypotension.  Physical exam grossly normal, though with diffuse muscle tenderness of all extremities, neck, and back.  No joint tenderness, effusion, erythema, swelling, or warmth of upper or lower extremities.  No rashes or vector bites noted.  She has been in contact with cardiology Dr. Wonda regarding  her symptoms, and he is planning to begin tapering her medications to see if these may be contributing when she was started at higher doses of her meds.  I will reach out to Dr. Wonda as she is currently on diltiazem  and metoprolol , and is experiencing episodes of low blood pressure readings, and see if he wishes to begin her medication taper sooner than her visit with them on 09/22/2024.  If her laboratory testing is within normal limits, then I would want her to follow-up in the [facility] after she has seen Dr. Wonda and started her medication taper to check in on her symptoms, and it would be reasonable at that time to also obtain a vitamin D level, vitamin B12 level, and folate level. - f/u ESR, CRP, ANA, FANA staining pattern, RF, SSA, SSB, LFTs, AM coritsol - if above labs are wnl, f/u in January 2026 and reasonable to consider obtaining Vitamin D level, Vitamin B12 level, and folate levels  - Advised holding any further Repatha  injections for now while she is experiencing these symptoms  - I will reach out to Dr. Wonda regarding Dilt & Metoprolol    ADDENDUM 09/13/2024: - Per Dr. Wonda on 12/23 okay to discontinue Diltiazem . Notified patient via MyChart regarding this.  - all labs wnl, with ANA positive and ratio borderline level at 1:80, though unlikely to have autoimmune disease in the setting of all other labs wnl. Notified patient via MyChart and recommend f/u in 09/2024 in the [facility] for symptom recheck once she has seen Dr. Wonda and continues  her medication taper.

## 2024-09-14 ENCOUNTER — Other Ambulatory Visit: Payer: Self-pay

## 2024-09-14 ENCOUNTER — Other Ambulatory Visit: Payer: Self-pay | Admitting: Gastroenterology

## 2024-09-14 MED ORDER — VOQUEZNA 10 MG PO TABS: 10.0000 mg | ORAL_TABLET | Freq: Every day | ORAL | 6 refills | Status: AC

## 2024-09-14 MED ORDER — LINACLOTIDE 145 MCG PO CAPS: 145.0000 ug | ORAL_CAPSULE | Freq: Every day | ORAL | 3 refills | Status: DC

## 2024-09-15 ENCOUNTER — Telehealth: Payer: Self-pay

## 2024-09-15 ENCOUNTER — Other Ambulatory Visit (HOSPITAL_COMMUNITY): Payer: Self-pay

## 2024-09-15 ENCOUNTER — Other Ambulatory Visit: Payer: Self-pay

## 2024-09-15 MED ORDER — OMEPRAZOLE 40 MG PO CPDR: 40.0000 mg | DELAYED_RELEASE_CAPSULE | Freq: Two times a day (BID) | ORAL | 3 refills | Status: AC

## 2024-09-15 NOTE — Telephone Encounter (Signed)
 For Voquezna , patient must try and fail ALL preferred alternatives listed: Esomeprazole , Lansoprazole, Omeprazole , Pantoprazole 

## 2024-09-15 NOTE — Telephone Encounter (Signed)
 Pharmacy Patient Advocate Encounter   Received notification from Patient Advice Request messages that prior authorization for Voquezna  10MG  tablets is required/requested.   Insurance verification completed.   The patient is insured through [employer] [facility] [employer].   Prior Authorization form/request asks a question that requires your assistance. Please see the question below and advise accordingly. The PA will not be submitted until the necessary information is received.   Patient must try and fail ALL preferred alternatives Esomeprazole  Lansoprazole Omeprazole  Pantoprazole 

## 2024-09-15 NOTE — Telephone Encounter (Signed)
 Script sent in for omeprazole .

## 2024-09-21 NOTE — Progress Notes (Signed)
 Internal Medicine Clinic Attending  Case discussed with the resident at the time of the visit.  We reviewed the resident's history and exam and pertinent patient test results.  I agree with the assessment, diagnosis, and plan of care documented in the resident's note.

## 2024-09-22 ENCOUNTER — Encounter: Payer: Self-pay | Admitting: Cardiovascular Disease

## 2024-09-22 ENCOUNTER — Ambulatory Visit: Attending: Emergency Medicine | Admitting: Emergency Medicine

## 2024-09-22 ENCOUNTER — Encounter: Payer: Self-pay | Admitting: Emergency Medicine

## 2024-09-22 ENCOUNTER — Other Ambulatory Visit (HOSPITAL_COMMUNITY): Payer: Self-pay

## 2024-09-22 VITALS — BP 118/74 | HR 62 | Ht 60.0 in | Wt 160.0 lb

## 2024-09-22 DIAGNOSIS — E785 Hyperlipidemia, unspecified: Secondary | ICD-10-CM | POA: Diagnosis not present

## 2024-09-22 DIAGNOSIS — I25119 Atherosclerotic heart disease of native coronary artery with unspecified angina pectoris: Secondary | ICD-10-CM | POA: Diagnosis not present

## 2024-09-22 DIAGNOSIS — R002 Palpitations: Secondary | ICD-10-CM

## 2024-09-22 DIAGNOSIS — I471 Supraventricular tachycardia, unspecified: Secondary | ICD-10-CM | POA: Diagnosis not present

## 2024-09-22 MED ORDER — AMLODIPINE BESYLATE 5 MG PO TABS
5.0000 mg | ORAL_TABLET | Freq: Every morning | ORAL | 3 refills | Status: AC
  Filled 2024-09-22: qty 90, 90d supply, fill #0

## 2024-09-22 NOTE — Progress Notes (Signed)
 " Cardiology Office Note:    Date:  09/22/2024  ID:  Donna Davis, Mullan 1970/12/13, MRN [id number] PCP: [name], MD  [facility] Providers Cardiologist:  Ozell Fell, MD       Patient Profile:       Chief Complaint: 90-month follow-up History of Present Illness:  Donna Davis is a 54 y.o. female with visit-pertinent history of coronary artery disease s/p DES to RCA and angioplasty /DES to LCx in 04/2024, palpitations with short runs of SVT noted on monitor in 03/2024, hyperlipidemia, hypothyroidism, GERD, IBS, low back pain, anxiety, multiple medication intolerances  Patient was referred to cardiology service with Dr. Edwyna in 03/2024 for further evaluation of palpitations and chest pain.  TTE showed LVEF 65 to 70% with normal wall motion and grade 1 diastolic dysfunction, normal RV, no significant valvular disease.  Monitor showed 9 short runs of SVT (longest run was 15 beats) and rare PACs/PVCs but no significant arrhythmias.  She had a syncopal episode following removal of the monitor so a second monitor was ordered.  Stress echo on 04/17/2024 was negative.  She was subsequently admitted for persistent chest pain since her stress echo concerning for unstable angina.  Underwent cardiac catheterization showed significant three-vessel CAD involving the LAD, LCx, and RCA that all appeared hemodynamically significant by virtual FFR.  She was seen by CT surgery and seen by Dr. Shyrl who felt the LAD lesion was not critical and felt PCI of RCA and LCx was felt to be a better option.  Therefore she was taken to the Cath Lab on 04/22/2024 and underwent successful IVUS guided PCI with DES to RCA and angioplasty and DES to mid to distal LCx.  She was admitted in 05/2024 for recurrent chest pains.  Limited echo showed LVEF 55 to 60%, normal RV function and no significant valvular disease.  She underwent repeat LHC which showed stable appearance of the coronary arteries with widely patent stents.   There was a long segment of 50-60% stenosis in the mid to distal LAD that appeared unchanged but was mildly significant by hemodynamic assessment.  Microvascular function was normal but coronary vasospasm was noted in the RCA and resolved with intracoronary nitroglycerin .  She was started on amlodipine  2.5 mg daily for coronary vasospasms.  Was seen in clinic on 06/19/2024 which she reported consistent chest pain described as spasms.  Her amlodipine  was increased.  She was then seen in clinic on 06/30/2024 with no improvement in chest discomfort.  Her amlodipine  was discontinued and she was started on diltiazem  120 mg daily.  Was seen by Dr. Fell on 08/04/2024.  She continued to experience lifestyle-limiting anginal symptoms on medical therapy with metoprolol  succinate and diltiazem  as well as antiplatelet therapy with Effient  and aspirin .  She was started on Ranexa  500 mg twice daily.  She underwent cardiac PET on 08/11/2024 which was a normal study.  She underwent sleep study in 07/2024 showing no evidence of obstructive sleep apnea.  She was seen in the [facility], ED on 08/11/2024 after she underwent her cardiac PET which intensified her chest pain.  She was seen by Dr. Sheena.  It was decided to increase her antianginal therapy.  Her Ranexa  was increased to 1000 mg twice daily and metoprolol  succinate to 25 mg daily.   Discussed the use of AI scribe software for clinical note transcription with the patient, who gave verbal consent to proceed.  History of Present Illness Donna Davis is a 54  year old female with coronary artery disease who presents with persistent chest pain.  She has constant chest pain radiating from the chest to the back that occurs 24/7.  She describes the constant chest pain as a burning and intermittent episodes of spasms.  It began shortly after stent placement, which relieved symptoms for about one month before this persistent pain started.  She denies any clear  triggers or alleviating factors.  She takes ranolazine  1000 mg twice daily and metoprolol  25 mg daily for angina. Diltiazem  was stopped on December 23rd due to fatigue and low blood pressure readings without any changes in her chest discomfort or fatigue.  She has muscle pain, malaise, and fatigue that she believes may be medication related. Her primary care provider stopped Repatha  due to concern for worsening these symptoms, with only slight improvement since discontinuation.  She had syncopal episodes before stent placement that led to temporary driving restriction. She has had no syncope in the past six months.  She has severe migraines triggered by isosorbide  and nitroglycerin .  She denies orthopnea, PND, palpitations, syncope, presyncope, lightheadedness, dizziness   Review of systems:  Please see the history of present illness. All other systems are reviewed and otherwise negative.      Studies Reviewed:    EKG Interpretation Date/Time:  Tuesday September 22 2024 11:21:32 EST Ventricular Rate:  62 PR Interval:  180 QRS Duration:  92 QT Interval:  444 QTC Calculation: 450 R Axis:   -10  Text Interpretation: Normal sinus rhythm Normal ECG When compared with ECG of 11-Aug-2024 11:57, PREVIOUS ECG IS PRESENT Confirmed by Rana Dixon [phone]) on 09/22/2024 12:02:53 PM    Cardiac catheterization 06/01/2024 Conclusions: Stable appearance of the coronary arteries following recent PCI's to the LCx and RCA with widely patent stents.  Long segment of 50-60% stenosis in the mid/distal LAD appears unchanged and is mildly significant by hemodynamic assessment (RFR = 0.90, FFR = 0.75). Normal microvascular function (CFR 5.8, IMR 6). Coronary vasospasm of the RCA noted, which resolved with intracoronary nitroglycerin . Low left ventricular filling pressure (LVEDP 3 mmHg).   Recommendations: Continue dual antiplatelet therapy with aspirin  and prasugrel  for 12 months from PCI's to LCx and  RCA in 04/2024. Add amlodipine  for treatment of coronary vasospasm, as the patient has been intolerant to long-acting nitrates. Aggressive secondary prevention of coronary artery disease. If severe chest pain at rest persists, consider further evaluation for alternative etiologies of chest pain, which seems out of proportion to her burden of CAD. Diagnostic Dominance: Right  Echocardiogram limited 05/31/2024  1. Left ventricular ejection fraction, by estimation, is 55 to 60%. The  left ventricle has normal function. Left ventricular endocardial border  not optimally defined to evaluate regional wall motion.   2. Right ventricular systolic function is normal. The right ventricular  size is normal. Tricuspid regurgitation signal is inadequate for assessing  PA pressure.   3. The mitral valve is normal in structure. No evidence of mitral valve  regurgitation. No evidence of mitral stenosis.   4. The aortic valve was not well visualized. Aortic valve regurgitation  is not visualized. No aortic stenosis is present.   5. The inferior vena cava is normal in size with greater than 50%  respiratory variability, suggesting right atrial pressure of 3 mmHg.   Cardiac catheterization 04/22/2024 1.  Mildly elevated left ventricular end-diastolic pressure at 18 mmHg. 2.  Successful IVUS guided PCI and drug-eluting stent placement to the right coronary artery extending to the ostium.  A 2.5 mm stent was placed and postdilated to a 3.0 proximally. 3.  Successful angioplasty and drug-eluting stent placement to the mid/distal left circumflex.  The stent partially jailed in OM branch which had normal flow and minimal stenosis at the ostium. 4.  The patient required high doses of sedatives and narcotics. Diagnostic Dominance: Right  Intervention   Risk Assessment/Calculations:              Physical Exam:   VS:  BP 118/74 (BP Location: Right Arm, Patient Position: Sitting, Cuff Size: Normal)   Pulse 62    Ht 5' (1.524 m)   Wt 160 lb (72.6 kg)   LMP 12/26/2004   SpO2 97%   BMI 31.25 kg/m    Wt Readings from Last 3 Encounters:  09/22/24 160 lb (72.6 kg)  09/08/24 162 lb 12.8 oz (73.8 kg)  09/07/24 161 lb (73 kg)    GEN: Well nourished, well developed in no acute distress NECK: No JVD; No carotid bruits CARDIAC: RRR, no murmurs, rubs, gallops RESPIRATORY:  Clear to auscultation without rales, wheezing or rhonchi  ABDOMEN: Soft, non-tender, non-distended EXTREMITIES:  No edema; No acute deformity      Assessment and Plan:  Coronary artery disease Coronary vasospasm LHC in 04/2024 showed multivessel CAD involving the LAD, LCx, and RCA. She was seen by CT Surgery but PCI was felt to be the better option so she underwent successful IVUS guided PCI with DES to RCA and angioplasty and DES to mid to distal LCX. Repeat LHC on 06/01/2024 showed stable stable disease with patent stents and evidence of coronary vasospasm in the RCA Recent cardiac PET 07/2024 was without ischemic changes - EKG today shows no acute ischemic features - Today patient continues to have daily constant chest discomfort described as 24/7 burning and occasional spasms now for several months.  She denies any alleviating or aggravating factors.  She does have lifestyle limiting anginal symptoms with current medical therapy including metoprolol  succinate and Ranexa  - Previously unable to tolerate isosorbide  due to migraines - As of late she has been experiencing extreme fatigue, headaches, and muscle aches was taken off diltiazem  and Repatha  by PCP on 08/2024 due to fatigue and low blood pressure readings.  Patient has had no improvement in her symptoms since medication change - We discussed trialing further antianginal therapy to treat her chest discomfort or trialing off of medications to help her fatigue.  As of this time she feels her chest discomfort is most debilitating to her.  We discussed trialing off of metoprolol  to  see if this improves her extreme fatigue, we decided to defer till follow-up visit.  If fatigue worsens I have asked her to message me on MyChart and we will decrease to half a tablet at that time.  As of now I will have her start taking her metoprolol  at nighttime to see if this helps - Plan to start amlodipine  5 mg daily for further antianginal therapy - Continue metoprolol  succinate 25 mg daily and Ranexa  1000 mg twice daily - Continue DAPT with aspirin  81 mg daily and Effient  10 mg daily - Referred to Pharm.D. lipid clinic  Palpitations PSVT ZIO 03/2024 showed 9 short runs of SVT (longest run 15 beats) and rare PACs/PVCs but no significant arrhythmias - Quiescent and controlled.  Recently taken off diltiazem  - Continue metoprolol  succinate 25 mg daily  Hyperlipidemia LDL 142 on 07/2024 Intolerant to statin therapy Repatha  was recently discontinued by PCP due to fatigue and muscle  weakness - Referred to Pharm.D. lipid clinic to discuss her other options     Dispo:  Return in about 12 weeks (around 12/15/2024).  Signed, Lum LITTIE Louis, NP  "

## 2024-09-22 NOTE — Patient Instructions (Addendum)
 Medication Instructions:  START TAKING AMLODIPINE  5 MG IN THE MORNING.  Lab Work: NONE TO BE DONE TODAY.  Testing/Procedures: NONE  Follow-Up: At [facility], you and your health needs are our priority.  As part of our continuing mission to provide you with exceptional heart care, our providers are all part of one team.  This team includes your primary Cardiologist (physician) and Advanced Practice Providers or APPs (Physician Assistants and Nurse Practitioners) who all work together to provide you with the care you need, when you need it.  Your next appointment:   8-12 WEEKS  Provider:   Ozell Fell, MD (ONLY)

## 2024-09-24 ENCOUNTER — Ambulatory Visit: Payer: Self-pay | Admitting: Gastroenterology

## 2024-09-24 ENCOUNTER — Ambulatory Visit (HOSPITAL_COMMUNITY)
Admission: RE | Admit: 2024-09-24 | Discharge: 2024-09-24 | Disposition: A | Source: Ambulatory Visit | Attending: Gastroenterology

## 2024-09-24 DIAGNOSIS — R6881 Early satiety: Secondary | ICD-10-CM | POA: Insufficient documentation

## 2024-09-24 MED ORDER — TECHNETIUM TC 99M SULFUR COLLOID
2.0000 | Freq: Once | INTRAVENOUS | Status: AC
  Administered 2024-09-24: 1.879 via INTRAVENOUS

## 2024-10-05 ENCOUNTER — Other Ambulatory Visit: Payer: Self-pay | Admitting: Family Medicine

## 2024-10-05 ENCOUNTER — Other Ambulatory Visit: Payer: Self-pay

## 2024-10-05 MED ORDER — CLONAZEPAM 0.5 MG PO TABS: 0.5000 mg | ORAL_TABLET | Freq: Two times a day (BID) | ORAL | 0 refills | Status: DC | PRN

## 2024-10-05 MED ORDER — CLONAZEPAM 0.5 MG PO TABS
0.5000 mg | ORAL_TABLET | Freq: Two times a day (BID) | ORAL | 0 refills | Status: AC | PRN
  Filled 2024-10-05: qty 60, 30d supply, fill #0

## 2024-10-05 NOTE — Telephone Encounter (Addendum)
 Last OV 11/07/23 Next OV not scheduled  Last refill 03/04/24  Qty #60/4   Forwarding to Dr. Joane.

## 2024-10-05 NOTE — Addendum Note (Signed)
 Addended by: MARDY LEOTIS RAMAN on: 10/05/2024 10:00 AM   Modules accepted: Orders

## 2024-10-06 ENCOUNTER — Other Ambulatory Visit: Payer: Self-pay

## 2024-10-07 ENCOUNTER — Other Ambulatory Visit: Payer: Self-pay

## 2024-10-19 ENCOUNTER — Other Ambulatory Visit (HOSPITAL_COMMUNITY): Payer: Self-pay

## 2024-10-19 ENCOUNTER — Other Ambulatory Visit: Payer: Self-pay

## 2024-10-19 ENCOUNTER — Encounter: Payer: Self-pay | Admitting: Cardiovascular Disease

## 2024-10-20 MED ORDER — RANOLAZINE ER 500 MG PO TB12: 1000.0000 mg | ORAL_TABLET | Freq: Two times a day (BID) | ORAL | 3 refills | Status: AC

## 2024-11-04 ENCOUNTER — Ambulatory Visit: Admitting: Gastroenterology

## 2024-12-09 ENCOUNTER — Ambulatory Visit: Admitting: Cardiovascular Disease
